# Patient Record
Sex: Female | Born: 1950 | Race: White | Hispanic: No | Marital: Single | Smoking: Current every day smoker
Health system: Southern US, Community
[De-identification: ages and names within clinical notes are randomized; demographics above are authoritative.]

## PROBLEM LIST (undated history)

## (undated) DIAGNOSIS — F32A Depression, unspecified: Secondary | ICD-10-CM

## (undated) DIAGNOSIS — F319 Bipolar disorder, unspecified: Secondary | ICD-10-CM

## (undated) DIAGNOSIS — F329 Major depressive disorder, single episode, unspecified: Secondary | ICD-10-CM

## (undated) DIAGNOSIS — R001 Bradycardia, unspecified: Secondary | ICD-10-CM

## (undated) DIAGNOSIS — F419 Anxiety disorder, unspecified: Secondary | ICD-10-CM

## (undated) DIAGNOSIS — E039 Hypothyroidism, unspecified: Secondary | ICD-10-CM

## (undated) DIAGNOSIS — T56891A Toxic effect of other metals, accidental (unintentional), initial encounter: Secondary | ICD-10-CM

## (undated) DIAGNOSIS — E119 Type 2 diabetes mellitus without complications: Secondary | ICD-10-CM

## (undated) HISTORY — DX: Anxiety disorder, unspecified: F41.9

## (undated) HISTORY — DX: Type 2 diabetes mellitus without complications: E11.9

## (undated) HISTORY — PX: CHOLECYSTECTOMY: SHX55

## (undated) HISTORY — DX: Major depressive disorder, single episode, unspecified: F32.9

## (undated) HISTORY — DX: Depression, unspecified: F32.A

## (undated) HISTORY — PX: ABDOMINAL HYSTERECTOMY: SHX81

---

## 1999-02-16 ENCOUNTER — Emergency Department (HOSPITAL_COMMUNITY): Admission: EM | Admit: 1999-02-16 | Discharge: 1999-02-16 | Payer: Self-pay | Admitting: Emergency Medicine

## 1999-07-16 ENCOUNTER — Other Ambulatory Visit: Admission: RE | Admit: 1999-07-16 | Discharge: 1999-07-16 | Payer: Self-pay | Admitting: Gynecology

## 2011-04-28 ENCOUNTER — Ambulatory Visit: Payer: Self-pay | Admitting: Gastroenterology

## 2011-06-16 ENCOUNTER — Inpatient Hospital Stay: Payer: Self-pay | Admitting: Psychiatry

## 2011-06-16 LAB — URINALYSIS, COMPLETE
Bacteria: NONE SEEN
Bilirubin,UR: NEGATIVE
Glucose,UR: NEGATIVE mg/dL (ref 0–75)
Ketone: NEGATIVE
Leukocyte Esterase: NEGATIVE
Ph: 5 (ref 4.5–8.0)
Protein: NEGATIVE
RBC,UR: <1 /HPF (ref 0–5)
WBC UR: 1 /HPF (ref 0–5)

## 2011-06-16 LAB — COMPREHENSIVE METABOLIC PANEL
Alkaline Phosphatase: 93 U/L (ref 50–136)
BUN: 12 mg/dL (ref 7–18)
Bilirubin,Total: 0.5 mg/dL (ref 0.2–1.0)
Chloride: 102 mmol/L (ref 98–107)
Co2: 22 mmol/L (ref 21–32)
Creatinine: 1.45 mg/dL — ABNORMAL HIGH (ref 0.60–1.30)
EGFR (African American): 47 — ABNORMAL LOW
EGFR (Non-African Amer.): 39 — ABNORMAL LOW
Osmolality: 281 (ref 275–301)
SGOT(AST): 90 U/L — ABNORMAL HIGH (ref 15–37)
SGPT (ALT): 54 U/L

## 2011-06-16 LAB — TSH: Thyroid Stimulating Horm: >100 u[IU]/mL

## 2011-06-16 LAB — DRUG SCREEN, URINE
Amphetamines, Ur Screen: NEGATIVE (ref ?–1000)
Benzodiazepine, Ur Scrn: NEGATIVE (ref ?–200)
Cannabinoid 50 Ng, Ur ~~LOC~~: NEGATIVE (ref ?–50)
Cocaine Metabolite,Ur ~~LOC~~: NEGATIVE (ref ?–300)
MDMA (Ecstasy)Ur Screen: NEGATIVE (ref ?–500)
Methadone, Ur Screen: NEGATIVE (ref ?–300)
Phencyclidine (PCP) Ur S: NEGATIVE (ref ?–25)
Tricyclic, Ur Screen: NEGATIVE (ref ?–1000)

## 2011-06-16 LAB — CBC
HCT: 44.9 % (ref 35.0–47.0)
HGB: 15 g/dL (ref 12.0–16.0)
MCH: 32.1 pg (ref 26.0–34.0)
MCHC: 33.4 g/dL (ref 32.0–36.0)
MCV: 96 fL (ref 80–100)
RDW: 17.3 % — ABNORMAL HIGH (ref 11.5–14.5)

## 2011-06-16 LAB — SALICYLATE LEVEL: Salicylates, Serum: 3.8 mg/dL — ABNORMAL HIGH

## 2011-06-16 LAB — ACETAMINOPHEN LEVEL: Acetaminophen: <2 ug/mL

## 2011-06-16 LAB — ETHANOL
Ethanol %: <0.003 % (ref 0.000–0.080)
Ethanol: <3 mg/dL

## 2011-06-16 LAB — HEMOGLOBIN A1C: Hemoglobin A1C: 6.2 % (ref 4.2–6.3)

## 2011-06-17 LAB — BASIC METABOLIC PANEL
BUN: 14 mg/dL (ref 7–18)
Chloride: 105 mmol/L (ref 98–107)
Co2: 27 mmol/L (ref 21–32)
Creatinine: 1.31 mg/dL — ABNORMAL HIGH (ref 0.60–1.30)
Glucose: 101 mg/dL — ABNORMAL HIGH (ref 65–99)
Osmolality: 284 (ref 275–301)
Potassium: 3.6 mmol/L (ref 3.5–5.1)

## 2011-06-17 LAB — TSH: Thyroid Stimulating Horm: >100 u[IU]/mL

## 2011-06-17 LAB — LIPID PANEL: Triglycerides: 682 mg/dL — ABNORMAL HIGH (ref 0–200)

## 2011-06-17 LAB — T4, FREE: Free Thyroxine: 0.57 ng/dL — ABNORMAL LOW (ref 0.76–1.46)

## 2011-06-17 LAB — FOLATE: Folic Acid: 6.8 ng/mL (ref 3.1–100.0)

## 2011-06-18 LAB — BASIC METABOLIC PANEL
Anion Gap: 13 (ref 7–16)
BUN: 14 mg/dL (ref 7–18)
Calcium, Total: 8.7 mg/dL (ref 8.5–10.1)
Co2: 26 mmol/L (ref 21–32)
Creatinine: 1.19 mg/dL (ref 0.60–1.30)
EGFR (African American): 60 — ABNORMAL LOW
Glucose: 115 mg/dL — ABNORMAL HIGH (ref 65–99)
Osmolality: 286 (ref 275–301)

## 2011-06-18 LAB — WBCS, STOOL

## 2011-06-21 LAB — T4, FREE: Free Thyroxine: 0.56 ng/dL — ABNORMAL LOW (ref 0.76–1.46)

## 2012-03-14 ENCOUNTER — Emergency Department: Payer: Self-pay | Admitting: Emergency Medicine

## 2012-03-14 LAB — URINALYSIS, COMPLETE
Bilirubin,UR: NEGATIVE
Blood: NEGATIVE
Glucose,UR: NEGATIVE mg/dL (ref 0–75)
Nitrite: NEGATIVE
Protein: NEGATIVE
RBC,UR: 1 /HPF (ref 0–5)
Specific Gravity: 1.005 (ref 1.003–1.030)
WBC UR: 2 /HPF (ref 0–5)

## 2012-03-14 LAB — COMPREHENSIVE METABOLIC PANEL
Alkaline Phosphatase: 141 U/L — ABNORMAL HIGH (ref 50–136)
Anion Gap: 14 (ref 7–16)
Bilirubin,Total: 0.6 mg/dL (ref 0.2–1.0)
Calcium, Total: 9.3 mg/dL (ref 8.5–10.1)
Chloride: 102 mmol/L (ref 98–107)
Co2: 21 mmol/L (ref 21–32)
EGFR (Non-African Amer.): >60
Osmolality: 273 (ref 275–301)
Sodium: 137 mmol/L (ref 136–145)

## 2012-03-14 LAB — AMYLASE: Amylase: 32 U/L (ref 25–115)

## 2012-03-14 LAB — CBC
HCT: 45.9 % (ref 35.0–47.0)
HGB: 16.2 g/dL — ABNORMAL HIGH (ref 12.0–16.0)
MCH: 32.9 pg (ref 26.0–34.0)
MCHC: 35.2 g/dL (ref 32.0–36.0)
Platelet: 206 10*3/uL (ref 150–440)
RBC: 4.91 10*6/uL (ref 3.80–5.20)

## 2012-03-14 LAB — LIPASE, BLOOD: Lipase: 103 U/L (ref 73–393)

## 2012-03-28 ENCOUNTER — Inpatient Hospital Stay: Payer: Self-pay | Admitting: Psychiatry

## 2012-03-28 LAB — COMPREHENSIVE METABOLIC PANEL
Alkaline Phosphatase: 122 U/L (ref 50–136)
BUN: 6 mg/dL — ABNORMAL LOW (ref 7–18)
Bilirubin,Total: 0.3 mg/dL (ref 0.2–1.0)
Calcium, Total: 8.6 mg/dL (ref 8.5–10.1)
Co2: 20 mmol/L — ABNORMAL LOW (ref 21–32)
EGFR (African American): >60
EGFR (Non-African Amer.): 57 — ABNORMAL LOW
Osmolality: 277 (ref 275–301)
Potassium: 3.4 mmol/L — ABNORMAL LOW (ref 3.5–5.1)
SGOT(AST): 42 U/L — ABNORMAL HIGH (ref 15–37)
Sodium: 138 mmol/L (ref 136–145)

## 2012-03-28 LAB — ETHANOL
Ethanol %: <0.003 % (ref 0.000–0.080)
Ethanol: <3 mg/dL

## 2012-03-28 LAB — DRUG SCREEN, URINE
Amphetamines, Ur Screen: NEGATIVE (ref ?–1000)
Benzodiazepine, Ur Scrn: NEGATIVE (ref ?–200)
Methadone, Ur Screen: NEGATIVE (ref ?–300)
Opiate, Ur Screen: NEGATIVE (ref ?–300)
Phencyclidine (PCP) Ur S: NEGATIVE (ref ?–25)

## 2012-03-28 LAB — TSH: Thyroid Stimulating Horm: 7.5 u[IU]/mL — ABNORMAL HIGH

## 2012-03-28 LAB — URINALYSIS, COMPLETE
Ketone: NEGATIVE
Nitrite: NEGATIVE
Ph: 6 (ref 4.5–8.0)
Protein: NEGATIVE
RBC,UR: NONE SEEN /HPF (ref 0–5)
Specific Gravity: 1.006 (ref 1.003–1.030)
WBC UR: 1 /HPF (ref 0–5)

## 2012-03-28 LAB — CBC
HGB: 16.3 g/dL — ABNORMAL HIGH (ref 12.0–16.0)
MCHC: 34.3 g/dL (ref 32.0–36.0)
Platelet: 214 10*3/uL (ref 150–440)
RDW: 15.4 % — ABNORMAL HIGH (ref 11.5–14.5)

## 2012-10-30 ENCOUNTER — Emergency Department: Payer: Self-pay | Admitting: Emergency Medicine

## 2012-10-30 LAB — COMPREHENSIVE METABOLIC PANEL
BUN: 11 mg/dL (ref 7–18)
Chloride: 104 mmol/L (ref 98–107)
Co2: 27 mmol/L (ref 21–32)
Creatinine: 1.09 mg/dL (ref 0.60–1.30)
EGFR (Non-African Amer.): 55 — ABNORMAL LOW
Osmolality: 276 (ref 275–301)
Potassium: 3.8 mmol/L (ref 3.5–5.1)
SGOT(AST): 28 U/L (ref 15–37)
SGPT (ALT): 33 U/L (ref 12–78)
Total Protein: 7.1 g/dL (ref 6.4–8.2)

## 2012-10-30 LAB — CBC
MCH: 31.1 pg (ref 26.0–34.0)
Platelet: 187 10*3/uL (ref 150–440)
RBC: 4.87 10*6/uL (ref 3.80–5.20)
RDW: 15.8 % — ABNORMAL HIGH (ref 11.5–14.5)

## 2012-10-31 LAB — URINALYSIS, COMPLETE
Bilirubin,UR: NEGATIVE
Blood: NEGATIVE
Glucose,UR: NEGATIVE mg/dL (ref 0–75)
Nitrite: POSITIVE
Ph: 5 (ref 4.5–8.0)
Protein: NEGATIVE
Specific Gravity: 1.019 (ref 1.003–1.030)
WBC UR: 6 /HPF (ref 0–5)

## 2012-12-27 ENCOUNTER — Ambulatory Visit: Payer: Self-pay | Admitting: Gastroenterology

## 2013-03-20 ENCOUNTER — Ambulatory Visit: Payer: Self-pay | Admitting: Family Medicine

## 2013-04-16 ENCOUNTER — Ambulatory Visit: Payer: Self-pay | Admitting: Family Medicine

## 2013-05-17 ENCOUNTER — Ambulatory Visit: Payer: Self-pay | Admitting: Family Medicine

## 2014-02-25 ENCOUNTER — Ambulatory Visit: Payer: Self-pay | Admitting: Specialist

## 2014-03-04 ENCOUNTER — Ambulatory Visit: Payer: Self-pay | Admitting: Gastroenterology

## 2014-03-19 LAB — PATHOLOGY REPORT

## 2014-05-20 ENCOUNTER — Ambulatory Visit: Payer: Self-pay | Admitting: Family Medicine

## 2014-08-10 ENCOUNTER — Emergency Department: Payer: Self-pay | Admitting: Emergency Medicine
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
## 2014-09-08 NOTE — Consult Note (Signed)
Psychological Assessment  Julie PikesSusan Church60of Evaluation: 2[birth year]11[birth year]13Administered: [place] Multiphasic Personality Inventory-2 (MMPI-2) for Referral: Ms. Julie HackerChurch was referred for a psychological assessment by her physician, Caryn SectionAarti Kapur, MD.  She was admitted to Behavioral Medicine for treatment of suicidal ideation with a plan and auditory and visual hallucinations. She has a history of bipolar disorder. Please see the history and physical and psychosocial history for further background information. An assessment of personality structure was requested. Ms. Julie Morrow?s MMPI-2 protocol is compared to that of other adult females she obtained the following profile: 2*7"36?81+-045/9:#. The MMPI-2 validity scales indicate that the clinical profile is valid. They also suggest she is experiencing long standing and serious problems. PresentationShe reports that she is experiencing a moderate level of emotional distress that is characterized by brooding, dysphoria, anger and anhedonia. She frequently worries about something or someone. She believes she is more nervous than most others. She is more sensitive and feels more intensely than most people. Her feelings are easily hurt and she is inclined to take things hard. She is often irritable and grouchy and generally feels angry with both herself and others. It makes her angry when people hurry her and she gets angry with herself for giving in to others so much. She has become so angry that she does not know what comes over her and she feels as though she will explode.  She reports that she has problems with attention, concentration and memory. Her judgment is not as good as it was in the past. She lacks self-confidence and believes that she is not as good as other people and thinks she is useless at times. She has a hard time making decisions and she feels helpless when she has to make some important decisions. She usually has to stop and think before she acts even in small  matters. She has sometimes thought that difficulties were piling up so high that she could not overcome them. Sometimes some unimportant thought will run through her mind and bother her for days. She thinks that she is about to go to pieces and to lose her mind. She is sensitive to any form of criticism, assumes others are against her, and anticipates being rejected. In everything she does lately, she thinks she is being tested. She has often been misunderstood when she was trying to be helpful. She is not happy with herself the way she is and wishes that she could be as happy as others seem to be. Her hardest battles are with herself.  Relations: She wishes she was not so shy. She is easily embarrassed. She finds it hard to talk when she meets new people or is in a group of people and spends most of her spare time by herself. Whenever possible she avoids being in a crowd.  Problem Areas: She reports a number of physical symptoms. She worries about her health. She has difficulty going to sleep because thoughts or ideas are bothering her, her sleep is fitful and disturbed and she does not wake up fresh and rested most mornings. She feels tired a good deal of the time. She is not as able to work as she once was. There is a theme of hopelessness that pervades her thoughts; hence, suicidal ideation should be evaluated carefully. Her prognosis is guarded because her anger and brooding resentment make it very difficult to develop a therapeutic alliance. Short-term, behavioral interventions that focus on her reasons for entering treatment will be most beneficial. Cognitive-behavioral therapy that focuses on her dysphoric mood  and anger also may be appropriate.  Impression:Disorder by historyDisorder NOSDisorder NOS with histrionic and dependent features   Electronic Signatures: Carola Frost (PsyD, HSP-P)  (Signed on 12-Feb-13 11:31)  Authored  Last Updated: 12-Feb-13 11:31 by Carola Frost (PsyD, HSP-P)
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
## 2014-09-08 NOTE — Consult Note (Signed)
Chief Complaint and History:   Referring Physician Dr. Maryruth BunKapur    Chief Complaint Hypothyroidism   Allergies:  Neosporin: Itching  Assessment/Plan:   Assessment/Plan Patient was seen and examined and chart was reviewed. She was admitted with suicidal ideation. TSH found to be >100 and free T4 is low. She has a 15 yr h/o hypothyroidism. Outpt dose of levothyroxine is 175 mcg daily. She admits to stopping the medication 3-4 weeks ago but then restarted it last Sat when she got a refill from a local Urgent Care. She has symptoms of hypothyroidism including fatigue, cold intolerance, and hair loss.  A / Primary hypothyroidism, severely uncontrolled P /  1. Agree with use of leovthyroxine 200 mcg PO x3 days and then reduce dose to 175 mcg daily. 2. Recheck Free T4 on Monday. If it has increased, then this indicates adequate drug absorption. No need to repeat the TSH until 4-6 weeks.  Full consult has been dictated.   Electronic Signatures: Raj JanusSolum, Shanica Castellanos M (MD)  (Signed 31-Jan-13 15:01)  Authored: Chief Complaint and History, ALLERGIES, Assessment/Plan   Last Updated: 31-Jan-13 15:01 by Raj JanusSolum, Tavarius Grewe M (MD)

## 2014-09-08 NOTE — Consult Note (Signed)
Chief Complaint:   Subjective/Chief Complaint Please see Dawn Harrison's notes. Diarrhea likely due to GB surgery. Stool studies so far are neg. Imodium worked initially to stop diarrhea. However, diarrhea back today. Questran started.   VITAL SIGNS/ANCILLARY NOTES: **Vital Signs.:   01-Feb-13 06:29   Vital Signs Type Q8   Temperature Temperature (F) 98.2   Celsius 36.7   Pulse Pulse 68   Respirations Respirations 20   Systolic BP Systolic BP 797   Diastolic BP (mmHg) Diastolic BP (mmHg) 71   Brief Assessment:   Cardiac Regular    Respiratory clear BS    Gastrointestinal details normal Soft  Nondistended  No masses palpable  Bowel sounds normal  No rebound tenderness  No gaurding  No rigidity  No organomegaly  mild low abd tenderness   Routine Chem:  01-Feb-13 05:56    Glucose, Serum 115   BUN 14   Creatinine (comp) 1.19   Sodium, Serum 143   Potassium, Serum 3.8   Chloride, Serum 104   CO2, Serum 26   Calcium (Total), Serum 8.7   Osmolality (calc) 286   eGFR (African American) 60   eGFR (Non-African American) 49   Anion Gap 13   Assessment/Plan:  Assessment/Plan:   Assessment Chronic diarrhea. Likely GB surgery related.    Plan Hopefully, diarrhea will resolve over time with questran. Pt does eventually need colonoscopy, perhaps outpt. Will check on Monday if diarrhea does not improve. THanks.   Electronic Signatures: Verdie Shire (MD)  (Signed 01-Feb-13 12:35)  Authored: Chief Complaint, VITAL SIGNS/ANCILLARY NOTES, Brief Assessment, Lab Results, Assessment/Plan   Last Updated: 01-Feb-13 12:35 by Verdie Shire (MD)
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
## 2014-09-08 NOTE — Consult Note (Signed)
Brief Consult Note: Diagnosis: Chronic Diarrhea s/p cholecystectomy.  Suspect bile induced but underlying infectious cause needs to be excluded.  Known history of Bipolar disorder.  History of hypothryoidism not well controlled.  Suidical thoughts with a plan.   Recommend further assessment or treatment.   Discussed with Attending MD.   Comments: Patient's presentation discussed with Dr. Lutricia FeilPaul Oh.  Recommendation is to proceed with stool studies as ordered.  Will await results.  Will order Questran one packet bid to be started after stool studies have been collected.  Recognize patient does need to proceed with colonoscopy in the future but recommend procedure being done on an outpatient basis and this is the patient's wish.  Electronic Signatures: Rodman KeyHarrison, Dawn S (NP)  (Signed 31-Jan-13 15:34)  Authored: Brief Consult Note   Last Updated: 31-Jan-13 15:34 by Rodman KeyHarrison, Dawn S (NP)

## 2014-11-07 ENCOUNTER — Ambulatory Visit (INDEPENDENT_AMBULATORY_CARE_PROVIDER_SITE_OTHER): Payer: Commercial Managed Care - HMO | Admitting: Psychiatry

## 2014-11-07 ENCOUNTER — Encounter: Payer: Self-pay | Admitting: Psychiatry

## 2014-11-07 ENCOUNTER — Other Ambulatory Visit: Payer: Self-pay | Admitting: Psychiatry

## 2014-11-07 ENCOUNTER — Other Ambulatory Visit: Payer: Self-pay

## 2014-11-07 VITALS — BP 138/78 | HR 85 | Temp 97.8°F | Ht 63.0 in | Wt 296.8 lb

## 2014-11-07 DIAGNOSIS — F331 Major depressive disorder, recurrent, moderate: Secondary | ICD-10-CM

## 2014-11-07 DIAGNOSIS — L409 Psoriasis, unspecified: Secondary | ICD-10-CM | POA: Insufficient documentation

## 2014-11-07 DIAGNOSIS — E785 Hyperlipidemia, unspecified: Secondary | ICD-10-CM | POA: Insufficient documentation

## 2014-11-07 MED ORDER — DULOXETINE HCL 30 MG PO CPEP: 30.0000 mg | ORAL_CAPSULE | Freq: Every day | ORAL | Status: DC

## 2014-11-07 MED ORDER — CLONAZEPAM 1 MG PO TABS: 1.0000 mg | ORAL_TABLET | Freq: Every day | ORAL | Status: DC

## 2014-11-07 MED ORDER — QUETIAPINE FUMARATE 50 MG PO TABS: 50.0000 mg | ORAL_TABLET | Freq: Every day | ORAL | Status: DC

## 2014-11-07 NOTE — Progress Notes (Signed)
BH MD/PA/NP OP Progress Note  11/07/2014 9:55 AM Julie Morrow  MRN:  [id number]  Subjective:    Patient is a 64 year old morbidly obese female who presented for the follow-up appointment. She reported that she is currently doing well on her medications. She stated that she almost missed her appointment. Patient reported that she has met with Dr. Luanna Cole and is planning for gastric sleeve. She is also trying to do some exercise to lose weight. Patient reported that she has moved in with her father after the death of her mother. Patient stated that she wants to go down on the dose of Cymbalta. She currently denied having any adverse effects of the medication. She denied having any suicidal homicidal ideations or plans.  Chief Complaint:  Chief Complaint    Follow-up; Depression     Visit Diagnosis:     ICD-9-CM ICD-10-CM   1. MDD (major depressive disorder), recurrent episode, moderate 296.32 F33.1     Past Medical History:  Past Medical History  Diagnosis Date  . Diabetes mellitus, type II   . Thyroid disease   . Depression   . Anxiety     Past Surgical History  Procedure Laterality Date  . Abdominal hysterectomy     Family History:  Family History  Problem Relation Age of Onset  . Dementia Mother   . Arthritis Father    Social History:  History   Social History  . Marital Status: Single    Spouse Name: N/A  . Number of Children: N/A  . Years of Education: N/A   Social History Main Topics  . Smoking status: Current Every Day Smoker -- 0.25 packs/day    Types: Cigarettes    Start date: 11/06/1984  . Smokeless tobacco: Never Used  . Alcohol Use: No  . Drug Use: No  . Sexual Activity: No   Other Topics Concern  . None   Social History Narrative  . None   Additional History:   Living with father after the death of mother. He is get along with.    Assessment:   Musculoskeletal: Strength & Muscle Tone: within normal limits Gait & Station: normal Patient  leans: N/A  Psychiatric Specialty Exam: HPI  Review of Systems  Constitutional: Positive for malaise/fatigue. Negative for weight loss.  HENT: Negative for nosebleeds and tinnitus.   Eyes: Negative for photophobia.  Respiratory: Negative for hemoptysis.   Cardiovascular: Negative for orthopnea.  Gastrointestinal: Negative for vomiting and blood in stool.  Genitourinary: Negative for frequency.  Musculoskeletal: Positive for back pain.  Psychiatric/Behavioral: Negative for depression and hallucinations. The patient has insomnia. The patient is not nervous/anxious.     Blood pressure 138/78, pulse 85, temperature 97.8 F (36.6 C), temperature source Tympanic, height _0  (1.6 m), weight 296 lb 12.8 oz (134.628 kg), SpO2 90 %.Body mass index is 52.59 kg/(m^2).  General Appearance: Disheveled  Eye Contact:  Fair  Speech:  Clear and Coherent  Volume:  Normal  Mood:  Depressed  Affect:  Congruent  Thought Process:  Coherent  Orientation:  Full (Time, Place, and Person)  Thought Content:  WDL  Suicidal Thoughts:  No  Homicidal Thoughts:  No  Memory:  Immediate;   Fair  Judgement:  Fair  Insight:  Fair  Psychomotor Activity:  Decreased  Concentration:  Fair  Recall:  [employer] of Knowledge: Fair  Language: Fair  Akathisia:  No  Handed:  Right  AIMS (if indicated):  none  Assets:  Communication Skills Desire  for Improvement Social Support  ADL's:  Intact  Cognition: WNL  Sleep:  7-8   Is the patient at risk to self?  No. Has the patient been a risk to self in the past 6 months?  No. Has the patient been a risk to self within the distant past?  No. Is the patient a risk to others?  No. Has the patient been a risk to others in the past 6 months?  No. Has the patient been a risk to others within the distant past?  No.  Current Medications: Current Outpatient Prescriptions  Medication Sig Dispense Refill  . albuterol (PROAIR HFA) 108 (90 BASE) MCG/ACT inhaler Inhale into  the lungs.    . clonazePAM (KLONOPIN) 1 MG tablet Take by mouth.    . DULoxetine (CYMBALTA) 30 MG capsule Take by mouth.    . fluticasone (FLONASE) 50 MCG/ACT nasal spray Place into the nose.    Marland Kitchen glipiZIDE (GLUCOTROL) 10 MG tablet Take by mouth.    Marland Kitchen JANUVIA 100 MG tablet TK 1 T PO QD  5  . Lancets 30G MISC Use. Use 1 lancet to skin twice a day    . levothyroxine (SYNTHROID, LEVOTHROID) 125 MCG tablet TK 2 TS PO QD OES WITH A GLASS OF WATER AT LEAST 30-60 MINUTES BEFORE BREAKFAST  5  . nystatin (MYCOSTATIN/NYSTOP) 100000 UNIT/GM POWD APPLY TOPICALLY TID  1  . omega-3 acid ethyl esters (LOVAZA) 1 G capsule TAKE 2 CAPSULES BY MOUTH TWICE DAILY    . omeprazole (PRILOSEC) 20 MG capsule Take by mouth.    . QUEtiapine (SEROQUEL) 50 MG tablet TK 1 T PO QHS  0   No current facility-administered medications for this visit.    Medical Decision Making:  Established Problem, Stable/Improving (1) and Review of Last Therapy Session (1)  Treatment Plan Summary:Medication management  Discussed with patient about the medications and she wants to decrease the dose of Cymbalta to 30 mg Continue other medications as prescribed Follow-up in 2 months   More than 50% of the time spent in psychoeducation, counseling and coordination of care.    This note was generated in part or whole with voice recognition software. Voice regonition is usually quite accurate but there are transcription errors that can and very often do occur. I apologize for any typographical errors that were not detected and corrected.      Rainey Pines 11/07/2014, 9:55 AM

## 2015-01-07 ENCOUNTER — Encounter: Payer: Self-pay | Admitting: Psychiatry

## 2015-01-07 ENCOUNTER — Ambulatory Visit (INDEPENDENT_AMBULATORY_CARE_PROVIDER_SITE_OTHER): Payer: Commercial Managed Care - HMO | Admitting: Psychiatry

## 2015-01-07 VITALS — BP 132/84 | HR 78 | Temp 97.5°F | Ht 63.5 in | Wt 302.8 lb

## 2015-01-07 DIAGNOSIS — F331 Major depressive disorder, recurrent, moderate: Secondary | ICD-10-CM | POA: Diagnosis not present

## 2015-01-07 MED ORDER — QUETIAPINE FUMARATE 50 MG PO TABS: 50.0000 mg | ORAL_TABLET | Freq: Every day | ORAL | Status: DC

## 2015-01-07 MED ORDER — DULOXETINE HCL 60 MG PO CPEP: 60.0000 mg | ORAL_CAPSULE | Freq: Every day | ORAL | Status: DC

## 2015-01-07 MED ORDER — CLONAZEPAM 0.5 MG PO TABS: 0.5000 mg | ORAL_TABLET | Freq: Every day | ORAL | Status: DC

## 2015-01-07 NOTE — Progress Notes (Signed)
BH MD/PA/NP OP Progress Note  01/07/2015 10:06 AM Julie Morrow  MRN:  [id number]  Subjective:    Patient is a 64 year old morbidly obese female who presented for the follow-up appointment. She appeared somewhat confused during the interview and reported that she was lost gain by coming to this office. She parked  in the wrong building and then has to walk to this office so she does not miss her appointment. Patient reported that she sleeps too much at night as she has been taking Klonopin and Seroquel. She reported that she eats a lot as well. She reported that she lives with her father. She was noted to have some rambling speech during the interview. Patient currently denied having any mood  swings anger anxiety and reported that she does not have any acute issues at this time.   Chief Complaint:  Chief Complaint    Follow-up; Medication Refill     Visit Diagnosis:     ICD-9-CM ICD-10-CM   1. MDD (major depressive disorder), recurrent episode, moderate 296.32 F33.1     Past Medical History:  Past Medical History  Diagnosis Date  . Diabetes mellitus, type II   . Thyroid disease   . Depression   . Anxiety     Past Surgical History  Procedure Laterality Date  . Abdominal hysterectomy     Family History:  Family History  Problem Relation Age of Onset  . Dementia Mother   . Arthritis Father    Social History:  Social History   Social History  . Marital Status: Single    Spouse Name: N/A  . Number of Children: N/A  . Years of Education: N/A   Social History Main Topics  . Smoking status: Current Every Day Smoker -- 0.25 packs/day    Types: Cigarettes    Start date: 11/06/1984  . Smokeless tobacco: Never Used  . Alcohol Use: No  . Drug Use: No  . Sexual Activity: No   Other Topics Concern  . None   Social History Narrative   Additional History:   Living with father after the death of mother.    Assessment:   Musculoskeletal: Strength & Muscle Tone:  within normal limits Gait & Station: normal Patient leans: N/A  Psychiatric Specialty Exam: HPI   Review of Systems  Constitutional: Positive for malaise/fatigue. Negative for weight loss.  HENT: Negative for nosebleeds and tinnitus.   Eyes: Negative for photophobia.  Respiratory: Negative for hemoptysis.   Cardiovascular: Negative for orthopnea.  Gastrointestinal: Negative for vomiting and blood in stool.  Genitourinary: Negative for frequency.  Musculoskeletal: Positive for back pain.  Psychiatric/Behavioral: Positive for depression. Negative for hallucinations. The patient has insomnia. The patient is not nervous/anxious.     Blood pressure 132/84, pulse 78, temperature 97.5 F (36.4 C), temperature source Tympanic, height 5' 3.5" (1.613 m), weight 302 lb 12.8 oz (137.349 kg), SpO2 92 %.Body mass index is 52.79 kg/(m^2).  General Appearance: Disheveled  Eye Contact:  Fair  Speech:  Slow  Volume:  Normal  Mood:  Depressed  Affect:  Congruent  Thought Process:  Coherent  Orientation:  Full (Time, Place, and Person)  Thought Content:  WDL  Suicidal Thoughts:  No  Homicidal Thoughts:  No  Memory:  Immediate;   Fair  Judgement:  Fair  Insight:  Fair  Psychomotor Activity:  Decreased  Concentration:  Fair  Recall:  [employer] of Knowledge: Fair  Language: Fair  Akathisia:  No  Handed:  Right  AIMS (if indicated):  none  Assets:  Communication Skills Desire for Improvement Social Support  ADL's:  Intact  Cognition: WNL  Sleep:  7-8   Is the patient at risk to self?  No. Has the patient been a risk to self in the past 6 months?  No. Has the patient been a risk to self within the distant past?  No. Is the patient a risk to others?  No. Has the patient been a risk to others in the past 6 months?  No. Has the patient been a risk to others within the distant past?  No.  Current Medications: Current Outpatient Prescriptions  Medication Sig Dispense Refill  . albuterol  (PROAIR HFA) 108 (90 BASE) MCG/ACT inhaler Inhale into the lungs.    . ciprofloxacin (CIPRO) 500 MG tablet Take by mouth.    . clonazePAM (KLONOPIN) 1 MG tablet Take 1 tablet (1 mg total) by mouth at bedtime. 30 tablet 1  . DULoxetine (CYMBALTA) 30 MG capsule Take 1 capsule (30 mg total) by mouth daily. 90 capsule 1  . fluconazole (DIFLUCAN) 150 MG tablet Take 1 tablet once a week for 2 weeks    . fluticasone (FLONASE) 50 MCG/ACT nasal spray Place into the nose.    Marland Kitchen glipiZIDE (GLUCOTROL) 10 MG tablet Take by mouth.    Marland Kitchen JANUVIA 100 MG tablet TK 1 T PO QD  5  . ketoconazole (NIZORAL) 2 % cream APP EXT AA QD  0  . Lancets 30G MISC Use. Use 1 lancet to skin twice a day    . levothyroxine (SYNTHROID, LEVOTHROID) 125 MCG tablet TK 2 TS PO QD OES WITH A GLASS OF WATER AT LEAST 30-60 MINUTES BEFORE BREAKFAST  5  . nystatin (MYCOSTATIN/NYSTOP) 100000 UNIT/GM POWD APPLY TOPICALLY TID  1  . omega-3 acid ethyl esters (LOVAZA) 1 G capsule TAKE 2 CAPSULES BY MOUTH TWICE DAILY    . omeprazole (PRILOSEC) 20 MG capsule Take by mouth.    . ONE TOUCH ULTRA TEST test strip CHECK BLOOD SUGAR BID UTD  12  . QUEtiapine (SEROQUEL) 50 MG tablet TAKE 1 TABLET(50 MG) BY MOUTH AT BEDTIME 90 tablet 1   No current facility-administered medications for this visit.    Medical Decision Making:  Established Problem, Stable/Improving (1) and Review of Last Therapy Session (1)  Treatment Plan Summary:Medication management  Discussed with patient about the medications She appeared somewhat sedated with the effect of the medications. I will decrease the Klonopin to 0.5 mg by mouth daily at bedtime I will titrate the Cymbalta back to 60 mg in the morning I will continue her on Seroquel 50 mg at bedtime She was given 90 day supplies of Cymbalta and Seroquel at this time. Follow-up in 3 months or earlier depending on her symptoms    More than 50% of the time spent in psychoeducation, counseling and coordination of care.     This note was generated in part or whole with voice recognition software. Voice regonition is usually quite accurate but there are transcription errors that can and very often do occur. I apologize for any typographical errors that were not detected and corrected.      Brandy Hale 01/07/2015, 10:06 AM

## 2015-01-13 NOTE — Progress Notes (Signed)
This has been reorder, not discontinued.  

## 2015-02-14 ENCOUNTER — Encounter: Payer: Self-pay | Admitting: Emergency Medicine

## 2015-02-14 ENCOUNTER — Emergency Department
Admission: EM | Admit: 2015-02-14 | Discharge: 2015-02-15 | Disposition: A | Discharge status: Home / Self Care | Payer: Commercial Managed Care - HMO | Attending: Emergency Medicine | Admitting: Emergency Medicine

## 2015-02-14 DIAGNOSIS — Z720 Tobacco use: Secondary | ICD-10-CM | POA: Diagnosis not present

## 2015-02-14 DIAGNOSIS — E1165 Type 2 diabetes mellitus with hyperglycemia: Secondary | ICD-10-CM | POA: Insufficient documentation

## 2015-02-14 DIAGNOSIS — R197 Diarrhea, unspecified: Secondary | ICD-10-CM | POA: Diagnosis not present

## 2015-02-14 DIAGNOSIS — N3001 Acute cystitis with hematuria: Secondary | ICD-10-CM | POA: Insufficient documentation

## 2015-02-14 DIAGNOSIS — B372 Candidiasis of skin and nail: Secondary | ICD-10-CM

## 2015-02-14 DIAGNOSIS — Z79899 Other long term (current) drug therapy: Secondary | ICD-10-CM | POA: Diagnosis not present

## 2015-02-14 DIAGNOSIS — R739 Hyperglycemia, unspecified: Secondary | ICD-10-CM

## 2015-02-14 DIAGNOSIS — R109 Unspecified abdominal pain: Secondary | ICD-10-CM | POA: Diagnosis present

## 2015-02-14 DIAGNOSIS — R21 Rash and other nonspecific skin eruption: Secondary | ICD-10-CM | POA: Insufficient documentation

## 2015-02-14 DIAGNOSIS — B373 Candidiasis of vulva and vagina: Secondary | ICD-10-CM | POA: Insufficient documentation

## 2015-02-14 DIAGNOSIS — B3731 Acute candidiasis of vulva and vagina: Secondary | ICD-10-CM

## 2015-02-14 LAB — COMPREHENSIVE METABOLIC PANEL
ALBUMIN: 3.8 g/dL (ref 3.5–5.0)
ALK PHOS: 138 U/L — AB (ref 38–126)
ALT: 28 U/L (ref 14–54)
AST: 66 U/L — AB (ref 15–41)
Anion gap: 14 (ref 5–15)
BUN: 18 mg/dL (ref 6–20)
CHLORIDE: 97 mmol/L — AB (ref 101–111)
CO2: 24 mmol/L (ref 22–32)
CREATININE: 1.03 mg/dL — AB (ref 0.44–1.00)
Calcium: 9.4 mg/dL (ref 8.9–10.3)
GFR calc non Af Amer: 57 mL/min — ABNORMAL LOW (ref >60)
GLUCOSE: 415 mg/dL — AB (ref 65–99)
Potassium: 4.2 mmol/L (ref 3.5–5.1)
SODIUM: 135 mmol/L (ref 135–145)
Total Bilirubin: 0.5 mg/dL (ref 0.3–1.2)
Total Protein: 7 g/dL (ref 6.5–8.1)

## 2015-02-14 LAB — CBC
HCT: 44.3 % (ref 35.0–47.0)
Hemoglobin: 14.8 g/dL (ref 12.0–16.0)
MCH: 31 pg (ref 26.0–34.0)
MCHC: 33.5 g/dL (ref 32.0–36.0)
MCV: 92.7 fL (ref 80.0–100.0)
PLATELETS: 164 10*3/uL (ref 150–440)
RBC: 4.78 MIL/uL (ref 3.80–5.20)
RDW: 16.2 % — ABNORMAL HIGH (ref 11.5–14.5)
WBC: 9.4 10*3/uL (ref 3.6–11.0)

## 2015-02-14 LAB — GLUCOSE, CAPILLARY: Glucose-Capillary: 308 mg/dL — ABNORMAL HIGH (ref 65–99)

## 2015-02-14 LAB — LIPASE, BLOOD: LIPASE: 24 U/L (ref 22–51)

## 2015-02-14 NOTE — ED Notes (Signed)
Patient said they were unable to give urine sample. Pt. Instructed to notify staff when she needs to use the restroom

## 2015-02-14 NOTE — ED Notes (Signed)
Pain in vagina.

## 2015-02-14 NOTE — ED Notes (Signed)
Having pain to left lower abd and radiates into back.  Positive diarrhea for 3 months.

## 2015-02-15 LAB — URINALYSIS COMPLETE WITH MICROSCOPIC (ARMC ONLY)
BILIRUBIN URINE: NEGATIVE
Glucose, UA: 150 mg/dL — AB
KETONES UR: NEGATIVE mg/dL
NITRITE: POSITIVE — AB
PH: 5 (ref 5.0–8.0)
Protein, ur: 30 mg/dL — AB
Specific Gravity, Urine: 1.024 (ref 1.005–1.030)

## 2015-02-15 LAB — GLUCOSE, CAPILLARY: Glucose-Capillary: 319 mg/dL — ABNORMAL HIGH (ref 65–99)

## 2015-02-15 MED ORDER — IBUPROFEN 600 MG PO TABS
600.0000 mg | ORAL_TABLET | Freq: Once | ORAL | Status: AC
  Administered 2015-02-15: 600 mg via ORAL

## 2015-02-15 MED ORDER — FLUCONAZOLE 100 MG PO TABS
150.0000 mg | ORAL_TABLET | Freq: Once | ORAL | Status: AC
  Administered 2015-02-15: 150 mg via ORAL
  Filled 2015-02-15: qty 1

## 2015-02-15 MED ORDER — ACETAMINOPHEN 325 MG PO TABS
650.0000 mg | ORAL_TABLET | Freq: Once | ORAL | Status: AC
  Administered 2015-02-15: 650 mg via ORAL

## 2015-02-15 MED ORDER — CEPHALEXIN 500 MG PO CAPS
500.0000 mg | ORAL_CAPSULE | Freq: Once | ORAL | Status: AC
  Administered 2015-02-15: 500 mg via ORAL
  Filled 2015-02-15: qty 1

## 2015-02-15 MED ORDER — IBUPROFEN 600 MG PO TABS
ORAL_TABLET | ORAL | Status: AC
  Administered 2015-02-15: 600 mg via ORAL
  Filled 2015-02-15: qty 1

## 2015-02-15 MED ORDER — ACETAMINOPHEN 325 MG PO TABS
ORAL_TABLET | ORAL | Status: AC
  Administered 2015-02-15: 650 mg via ORAL
  Filled 2015-02-15: qty 2

## 2015-02-15 MED ORDER — FLUCONAZOLE 150 MG PO TABS: ORAL_TABLET | ORAL | Status: DC

## 2015-02-15 MED ORDER — CEPHALEXIN 500 MG PO CAPS: 500.0000 mg | ORAL_CAPSULE | Freq: Four times a day (QID) | ORAL | Status: AC

## 2015-02-15 MED ORDER — NYSTATIN 100000 UNIT/GM EX POWD
CUTANEOUS | Status: DC
Start: 2015-02-15 — End: 2015-06-30

## 2015-02-15 NOTE — Discharge Instructions (Signed)
Candida Infection A Candida infection (also called yeast, fungus, and Monilia infection) is an overgrowth of yeast that can occur anywhere on the body. A yeast infection commonly occurs in warm, moist body areas. Usually, the infection remains localized but can spread to become a systemic infection. A yeast infection may be a sign of a more severe disease such as diabetes, leukemia, or AIDS. A yeast infection can occur in both men and women. In women, Candida vaginitis is a vaginal infection. It is one of the most common causes of vaginitis. Men usually do not have symptoms or know they have an infection until other problems develop. Men may find out they have a yeast infection because their sex partner has a yeast infection. Uncircumcised men are more likely to get a yeast infection than circumcised men. This is because the uncircumcised glans is not exposed to air and does not remain as dry as that of a circumcised glans. Older adults may develop yeast infections around dentures. CAUSES  Women  Antibiotics.  Steroid medication taken for a long time.  Being overweight (obese).  Diabetes.  Poor immune condition.  Certain serious medical conditions.  Immune suppressive medications for organ transplant patients.  Chemotherapy.  Pregnancy.  Menstruation.  Stress and fatigue.  Intravenous drug use.  Oral contraceptives.  Wearing tight-fitting clothes in the crotch area.  Catching it from a sex partner who has a yeast infection.  Spermicide.  Intravenous, urinary, or other catheters. Men  Catching it from a sex partner who has a yeast infection.  Having oral or anal sex with a person who has the infection.  Spermicide.  Diabetes.  Antibiotics.  Poor immune system.  Medications that suppress the immune system.  Intravenous drug use.  Intravenous, urinary, or other catheters. SYMPTOMS  Women  Thick, white vaginal discharge.  Vaginal itching.  Redness and  swelling in and around the vagina.  Irritation of the lips of the vagina and perineum.  Blisters on the vaginal lips and perineum.  Painful sexual intercourse.  Low blood sugar (hypoglycemia).  Painful urination.  Bladder infections.  Intestinal problems such as constipation, indigestion, bad breath, bloating, increase in gas, diarrhea, or loose stools. Men  Men may develop intestinal problems such as constipation, indigestion, bad breath, bloating, increase in gas, diarrhea, or loose stools.  Dry, cracked skin on the penis with itching or discomfort.  Jock itch.  Dry, flaky skin.  Athlete's foot.  Hypoglycemia. DIAGNOSIS  Women  A history and an exam are performed.  The discharge may be examined under a microscope.  A culture may be taken of the discharge. Men  A history and an exam are performed.  Any discharge from the penis or areas of cracked skin will be looked at under the microscope and cultured.  Stool samples may be cultured. TREATMENT  Women  Vaginal antifungal suppositories and creams.  Medicated creams to decrease irritation and itching on the outside of the vagina.  Warm compresses to the perineal area to decrease swelling and discomfort.  Oral antifungal medications.  Medicated vaginal suppositories or cream for repeated or recurrent infections.  Wash and dry the irritation areas before applying the cream.  Eating yogurt with Lactobacillus may help with prevention and treatment.  Sometimes painting the vagina with gentian violet solution may help if creams and suppositories do not work. Men  Antifungal creams and oral antifungal medications.  Sometimes treatment must continue for 30 days after the symptoms go away to prevent recurrence. HOME CARE INSTRUCTIONS  Women  Use cotton underwear and avoid tight-fitting clothing.  Avoid colored, scented toilet paper and deodorant tampons or pads.  Do not douche.  Keep your diabetes  under control.  Finish all the prescribed medications.  Keep your skin clean and dry.  Consume milk or yogurt with Lactobacillus-active culture regularly. If you get frequent yeast infections and think that is what the infection is, there are over-the-counter medications that you can get. If the infection does not show healing in 3 days, talk to your caregiver.  Tell your sex partner you have a yeast infection. Your partner may need treatment also, especially if your infection does not clear up or recurs. Men  Keep your skin clean and dry.  Keep your diabetes under control.  Finish all prescribed medications.  Tell your sex partner that you have a yeast infection so he or she can be treated if necessary. SEEK MEDICAL CARE IF:   Your symptoms do not clear up or worsen in one week after treatment.  You have an oral temperature above 102 F (38.9 C).  You have trouble swallowing or eating for a prolonged time.  You develop blisters on and around your vagina.  You develop vaginal bleeding and it is not your menstrual period.  You develop abdominal pain.  You develop intestinal problems as mentioned above.  You get weak or light-headed.  You have painful or increased urination.  You have pain during sexual intercourse. MAKE SURE YOU:   Understand these instructions.  Will watch your condition.  Will get help right away if you are not doing well or get worse. Document Released: 06/10/2004 Document Revised: 09/17/2013 Document Reviewed: 09/22/2009 [facility] Patient Information 2015 [place], [place]. This information is not intended to replace advice given to you by your health care provider. Make sure you discuss any questions you have with your health care provider.  Candidal Vulvovaginitis Candidal vulvovaginitis is an infection of the vagina and vulva. The vulva is the skin around the opening of the vagina. This may cause itching and discomfort in and around the vagina.    HOME CARE  Only take medicine as told by your doctor.  Do not have sex (intercourse) until the infection is healed or as told by your doctor.  Practice safe sex.  Tell your sex partner about your infection.  Do not douche or use tampons.  Wear cotton underwear. Do not wear tight pants or panty hose.  Eat yogurt. This may help treat and prevent yeast infections. GET HELP RIGHT AWAY IF:   You have a fever.  Your problems get worse during treatment or do not get better in 3 days.  You have discomfort, irritation, or itching in your vagina or vulva area.  You have pain after sex.  You start to get belly (abdominal) pain. MAKE SURE YOU:  Understand these instructions.  Will watch your condition.  Will get help right away if you are not doing well or get worse. Document Released: 07/30/2008 Document Revised: 05/08/2013 Document Reviewed: 07/30/2008 [facility] Patient Information 2015 [place], [place]. This information is not intended to replace advice given to you by your health care provider. Make sure you discuss any questions you have with your health care provider.  High Blood Sugar High blood sugar (hyperglycemia) means that the level of sugar in your blood is higher than it should be. Signs of high blood sugar include:  Feeling thirsty.  Frequent peeing (urinating).  Feeling tired or sleepy.  Dry mouth.  Vision changes.  Feeling weak.  Feeling hungry but losing weight.  Numbness and tingling in your hands or feet.  Headache. When you ignore these signs, your blood sugar may keep going up. These problems may get worse, and other problems may begin. HOME CARE  Check your blood sugars as told by your doctor. Write down the numbers with the date and time.  Take the right amount of insulin or diabetes pills at the right time. Write down the dose with date and time.  Refill your insulin or diabetes pills before running out.  Watch what you eat. Follow your  meal plan.  Drink liquids without sugar, such as water. Check with your doctor if you have kidney or heart disease.  Follow your doctor's orders for exercise. Exercise at the same time of day.  Keep your doctor's appointments. GET HELP RIGHT AWAY IF:   You have trouble thinking or are confused.  You have fast breathing with fruity smelling breath.  You pass out (faint).  You have 2 to 3 days of high blood sugars and you do not know why.  You have chest pain.  You are feeling sick to your stomach (nauseous) or throwing up (vomiting).  You have sudden vision changes. MAKE SURE YOU:   Understand these instructions.  Will watch your condition.  Will get help right away if you are not doing well or get worse. Document Released: 02/28/2009 Document Revised: 07/26/2011 Document Reviewed: 02/28/2009 [facility] Patient Information 2015 [place], [place]. This information is not intended to replace advice given to you by your health care provider. Make sure you discuss any questions you have with your health care provider.  Urinary Tract Infection Urinary tract infections (UTIs) can develop anywhere along your urinary tract. Your urinary tract is your body's drainage system for removing wastes and extra water. Your urinary tract includes two kidneys, two ureters, a bladder, and a urethra. Your kidneys are a pair of bean-shaped organs. Each kidney is about the size of your fist. They are located below your ribs, one on each side of your spine. CAUSES Infections are caused by microbes, which are microscopic organisms, including fungi, viruses, and bacteria. These organisms are so small that they can only be seen through a microscope. Bacteria are the microbes that most commonly cause UTIs. SYMPTOMS  Symptoms of UTIs may vary by age and gender of the patient and by the location of the infection. Symptoms in young women typically include a frequent and intense urge to urinate and a painful,  burning feeling in the bladder or urethra during urination. Older women and men are more likely to be tired, shaky, and weak and have muscle aches and abdominal pain. A fever may mean the infection is in your kidneys. Other symptoms of a kidney infection include pain in your back or sides below the ribs, nausea, and vomiting. DIAGNOSIS To diagnose a UTI, your caregiver will ask you about your symptoms. Your caregiver also will ask to provide a urine sample. The urine sample will be tested for bacteria and white blood cells. White blood cells are made by your body to help fight infection. TREATMENT  Typically, UTIs can be treated with medication. Because most UTIs are caused by a bacterial infection, they usually can be treated with the use of antibiotics. The choice of antibiotic and length of treatment depend on your symptoms and the type of bacteria causing your infection. HOME CARE INSTRUCTIONS  If you were prescribed antibiotics, take them exactly as your caregiver instructs  you. Doreatha Martin the medication even if you feel better after you have only taken some of the medication.  Drink enough water and fluids to keep your urine clear or pale yellow.  Avoid caffeine, tea, and carbonated beverages. They tend to irritate your bladder.  Empty your bladder often. Avoid holding urine for long periods of time.  Empty your bladder before and after sexual intercourse.  After a bowel movement, women should cleanse from front to back. Use each tissue only once. SEEK MEDICAL CARE IF:   You have back pain.  You develop a fever.  Your symptoms do not begin to resolve within 3 days. SEEK IMMEDIATE MEDICAL CARE IF:   You have severe back pain or lower abdominal pain.  You develop chills.  You have nausea or vomiting.  You have continued burning or discomfort with urination. MAKE SURE YOU:   Understand these instructions.  Will watch your condition.  Will get help right away if you are not  doing well or get worse. Document Released: 02/10/2005 Document Revised: 11/02/2011 Document Reviewed: 06/11/2011 [facility] Patient Information 2015 [place], [place]. This information is not intended to replace advice given to you by your health care provider. Make sure you discuss any questions you have with your health care provider.

## 2015-02-15 NOTE — ED Provider Notes (Signed)
[facility] Emergency Department Provider Note  ____________________________________________  Time seen: Approximately 2343 PM  I have reviewed the triage vital signs and the nursing notes.   HISTORY  Chief Complaint Abdominal Pain    HPI Julie Morrow is a 64 y.o. female who comes in today complaining of soreness of her bottom and her vagina. She reports that sometimes this clearance myself that comes out of her blood tox vagina and from under her breasts. The patient reports that symptoms been going on for over a month. She reports that it is gotten really bad under her breasts and between her legs when she reports that something does not feel right. She reports that she has seen her doctor in the past but has not seen her doctor recently. The patient reports that she has been given some medications but wants this taken care of. She also reports that she would like something for pain. She reports that every time she moves it hurts in her vagina is itchy. The patient denies any nausea or vomiting denies any abdominal pain. The patient reports she is also had diarrhea for 3 months 4-5 times daily.The patient reports that she's not sure what exactly is going on but she wants it taken care of. She has not taken anything for her pain she has not taken anything for the itching.   Past Medical History  Diagnosis Date  . Diabetes mellitus, type II   . Thyroid disease   . Depression   . Anxiety     Patient Active Problem List   Diagnosis Date Noted  . Clinical depression 11/07/2014  . Acid reflux 11/07/2014  . HLD (hyperlipidemia) 11/07/2014  . Psoriasis 11/07/2014  . Disease of thyroid gland 11/07/2014    Past Surgical History  Procedure Laterality Date  . Abdominal hysterectomy      Current Outpatient Rx  Name  Route  Sig  Dispense  Refill  . albuterol (PROAIR HFA) 108 (90 BASE) MCG/ACT inhaler   Inhalation   Inhale into the lungs.         .  cephALEXin (KEFLEX) 500 MG capsule   Oral   Take 1 capsule (500 mg total) by mouth 4 (four) times daily.   20 capsule   0   . clonazePAM (KLONOPIN) 0.5 MG tablet   Oral   Take 1 tablet (0.5 mg total) by mouth at bedtime.   30 tablet   2   . DULoxetine (CYMBALTA) 60 MG capsule   Oral   Take 1 capsule (60 mg total) by mouth daily.   90 capsule   1     **Patient requests 90 days supply**   . fluconazole (DIFLUCAN) 150 MG tablet      Take 1 tablet once a week for 2 weeks         . fluconazole (DIFLUCAN) 150 MG tablet      Take 1 tab on 02/17/15 and take 1 tab on 02/20/15   2 tablet   0   . fluticasone (FLONASE) 50 MCG/ACT nasal spray   Nasal   Place into the nose.         Marland Kitchen glipiZIDE (GLUCOTROL) 10 MG tablet   Oral   Take by mouth.         Marland Kitchen JANUVIA 100 MG tablet      TK 1 T PO QD      5     Dispense as written.   Marland Kitchen ketoconazole (NIZORAL) 2 %  cream      APP EXT AA QD      0   . Lancets 30G MISC      Use. Use 1 lancet to skin twice a day         . levothyroxine (SYNTHROID, LEVOTHROID) 125 MCG tablet      TK 2 TS PO QD OES WITH A GLASS OF WATER AT LEAST 30-60 MINUTES BEFORE BREAKFAST      5   . nystatin (MYCOSTATIN/NYSTOP) 100000 UNIT/GM POWD      APPLY TOPICALLY TID      1   . nystatin (MYCOSTATIN/NYSTOP) 100000 UNIT/GM POWD      Apply to affected area three times daily   60 g   0   . omega-3 acid ethyl esters (LOVAZA) 1 G capsule      TAKE 2 CAPSULES BY MOUTH TWICE DAILY         . omeprazole (PRILOSEC) 20 MG capsule   Oral   Take by mouth.         . ONE TOUCH ULTRA TEST test strip      CHECK BLOOD SUGAR BID UTD      12     Dispense as written.   Marland Kitchen QUEtiapine (SEROQUEL) 50 MG tablet   Oral   Take 1 tablet (50 mg total) by mouth at bedtime.   90 tablet   1     **Patient requests 90 days supply**     Allergies Neomycin-bacitracin zn-polymyx  Family History  Problem Relation Age of Onset  . Dementia Mother   .  Arthritis Father     Social History Social History  Substance Use Topics  . Smoking status: Current Every Day Smoker -- 0.25 packs/day    Types: Cigarettes    Start date: 11/06/1984  . Smokeless tobacco: Never Used  . Alcohol Use: No    Review of Systems Constitutional: No fever/chills Eyes: No visual changes. ENT: No sore throat. Cardiovascular: Denies chest pain. Respiratory: Denies shortness of breath. Gastrointestinal: No abdominal pain.  No nausea, no vomiting.  No diarrhea.  No constipation. Genitourinary: Negative for dysuria. Musculoskeletal: Negative for back pain. Skin: rash under breast and between thighs Neurological: Negative for headaches, focal weakness or numbness.  10-point ROS otherwise negative.  ____________________________________________   PHYSICAL EXAM:  VITAL SIGNS: ED Triage Vitals  Enc Vitals Group     BP 02/14/15 1809 131/60 mmHg     Pulse Rate 02/14/15 1809 82     Resp 02/14/15 1809 20     Temp 02/14/15 1809 98.1 F (36.7 C)     Temp Source 02/14/15 1809 Oral     SpO2 02/14/15 1809 94 %     Weight 02/14/15 1809 285 lb (129.275 kg)     Height 02/14/15 1809  (1.6 m)     Head Cir --      Peak Flow --      Pain Score 02/14/15 1814 10     Pain Loc --      Pain Edu? --      Excl. in GC? --     Constitutional: Alert and oriented. Well appearing and in moderate distress. Eyes: Conjunctivae are normal. PERRL. EOMI. Head: Atraumatic. Nose: No congestion/rhinnorhea. Mouth/Throat: Mucous membranes are moist.  Oropharynx non-erythematous. Cardiovascular: Normal rate, regular rhythm. Grossly normal heart sounds.  Good peripheral circulation. Respiratory: Normal respiratory effort.  No retractions. Lungs CTAB. Gastrointestinal: Soft and nontender. No distention. Positive bowel sounds Genitourinary: erythematous rash  to bilateral thighs, thick white discharge to vagina Musculoskeletal: No lower extremity tenderness nor edema.    Neurologic:  Normal speech and language.  Skin:  Erythema under breasts, maculopapular erythematous rash to bilateral thighs Psychiatric: Mood and affect are normal.   ____________________________________________   LABS (all labs ordered are listed, but only abnormal results are displayed)  Labs Reviewed  COMPREHENSIVE METABOLIC PANEL - Abnormal; Notable for the following:    Chloride 97 (*)    Glucose, Bld 415 (*)    Creatinine, Ser 1.03 (*)    AST 66 (*)    Alkaline Phosphatase 138 (*)    GFR calc non Af Amer 57 (*)    All other components within normal limits  CBC - Abnormal; Notable for the following:    RDW 16.2 (*)    All other components within normal limits  URINALYSIS COMPLETEWITH MICROSCOPIC ([facility] ONLY) - Abnormal; Notable for the following:    Color, Urine YELLOW (*)    APPearance HAZY (*)    Glucose, UA 150 (*)    Hgb urine dipstick 3+ (*)    Protein, ur 30 (*)    Nitrite POSITIVE (*)    Leukocytes, UA TRACE (*)    Bacteria, UA MANY (*)    Squamous Epithelial / LPF 0-5 (*)    All other components within normal limits  GLUCOSE, CAPILLARY - Abnormal; Notable for the following:    Glucose-Capillary 308 (*)    All other components within normal limits  GLUCOSE, CAPILLARY - Abnormal; Notable for the following:    Glucose-Capillary 319 (*)    All other components within normal limits  URINE CULTURE  LIPASE, BLOOD  CBG MONITORING, ED   ____________________________________________  EKG  none ____________________________________________  RADIOLOGY  none ____________________________________________   PROCEDURES  Procedure(s) performed: None  Critical Care performed: No  ____________________________________________   INITIAL IMPRESSION / ASSESSMENT AND PLAN / ED COURSE  Pertinent labs & imaging results that were available during my care of the patient were reviewed by me and considered in my medical decision making (see chart for  details).  This is a 64 year old female who comes in today with soreness, itching and drainage from her bottom, vagina and under her breast. The patient does have a rash that is candidal in appearance. The patient also appears to have a urinary tract infection with positive nitrates and many bacteria. I did give the patient a dose of Diflucan here in the emergency department as well as some ibuprofen and Tylenol for her discomfort. I will give the patient some Keflex but some the patient home with 2 subsequent doses of Diflucan to take 2 days apart. The patient's blood sugars appear uncontrolled with a glucose of 415 and a repeat of 308. I informed the patient that if her blood sugars are not controlled she will continue to have these candidal infections. I will have the patient follow up with her primary care physician for further evaluation and treatment of her hyperglycemia as well as her candidal dermatitis andvulvovaginitis. ____________________________________________   FINAL CLINICAL IMPRESSION(S) / ED DIAGNOSES  Final diagnoses:  Acute cystitis with hematuria  Candidal vulvovaginitis  Yeast dermatitis  Hyperglycemia      Rebecka Apley, MD 02/15/15 0310

## 2015-02-15 NOTE — ED Notes (Signed)
Patient with no complaints at this time. Respirations even and unlabored. Skin warm/dry. Discharge instructions reviewed with patient at this time. Patient given opportunity to voice concerns/ask questions. Patient discharged at this time and left Emergency Department with steady gait.   

## 2015-02-26 ENCOUNTER — Other Ambulatory Visit: Payer: Self-pay | Admitting: Nurse Practitioner

## 2015-02-26 DIAGNOSIS — K529 Noninfective gastroenteritis and colitis, unspecified: Secondary | ICD-10-CM

## 2015-02-26 DIAGNOSIS — R1084 Generalized abdominal pain: Secondary | ICD-10-CM

## 2015-02-26 DIAGNOSIS — R110 Nausea: Secondary | ICD-10-CM

## 2015-02-28 ENCOUNTER — Ambulatory Visit
Admission: RE | Admit: 2015-02-28 | Discharge: 2015-02-28 | Disposition: A | Discharge status: Home / Self Care | Payer: Commercial Managed Care - HMO | Source: Ambulatory Visit | Attending: Nurse Practitioner | Admitting: Nurse Practitioner

## 2015-02-28 DIAGNOSIS — K529 Noninfective gastroenteritis and colitis, unspecified: Secondary | ICD-10-CM | POA: Diagnosis present

## 2015-02-28 DIAGNOSIS — K760 Fatty (change of) liver, not elsewhere classified: Secondary | ICD-10-CM | POA: Diagnosis not present

## 2015-02-28 DIAGNOSIS — K5730 Diverticulosis of large intestine without perforation or abscess without bleeding: Secondary | ICD-10-CM | POA: Diagnosis not present

## 2015-02-28 DIAGNOSIS — R1084 Generalized abdominal pain: Secondary | ICD-10-CM | POA: Diagnosis present

## 2015-02-28 DIAGNOSIS — R110 Nausea: Secondary | ICD-10-CM

## 2015-02-28 MED ORDER — IOHEXOL 350 MG/ML SOLN
125.0000 mL | Freq: Once | INTRAVENOUS | Status: AC | PRN
  Administered 2015-02-28: 125 mL via INTRAVENOUS

## 2015-03-14 ENCOUNTER — Telehealth: Payer: Self-pay

## 2015-03-14 NOTE — Telephone Encounter (Signed)
pt called stated that she needed refill on her clonazepam .5mg .  pt states that she the pharmacy will not fill her rx..Marland Kitchen

## 2015-03-14 NOTE — Telephone Encounter (Signed)
pt was called, i asked pt if she was taking more the what she was prescripted and pt stated yes that sometimes she take 2. pt was advised that she should follow doctor orders and that if the medication was not working that she needed to let the doctor know before making changes to any medication on her own.  Pt was told that she would not be able to get medications refill until the 3rd according to the pharmacy.  Pt was also told that she would not get another refill to make up for the dosage she already took.

## 2015-03-14 NOTE — Telephone Encounter (Signed)
per the pharmacy pt does have a refill left but pt can not have refill until at least the 3rd because pt had rx filled on the 6th.

## 2015-04-08 ENCOUNTER — Ambulatory Visit (INDEPENDENT_AMBULATORY_CARE_PROVIDER_SITE_OTHER): Payer: Medicare HMO | Admitting: Psychiatry

## 2015-04-08 ENCOUNTER — Encounter: Payer: Self-pay | Admitting: Psychiatry

## 2015-04-08 VITALS — BP 122/68 | HR 89 | Temp 97.9°F | Ht 63.0 in | Wt 285.2 lb

## 2015-04-08 DIAGNOSIS — F331 Major depressive disorder, recurrent, moderate: Secondary | ICD-10-CM | POA: Diagnosis not present

## 2015-04-08 MED ORDER — CLONAZEPAM 0.5 MG PO TABS: 0.5000 mg | ORAL_TABLET | Freq: Every day | ORAL | Status: DC

## 2015-04-08 MED ORDER — DULOXETINE HCL 60 MG PO CPEP
60.0000 mg | ORAL_CAPSULE | Freq: Every day | ORAL | Status: DC
Start: 2015-04-08 — End: 2015-07-08

## 2015-04-08 MED ORDER — QUETIAPINE FUMARATE 50 MG PO TABS: 50.0000 mg | ORAL_TABLET | Freq: Every day | ORAL | Status: DC

## 2015-04-08 NOTE — Progress Notes (Signed)
BH MD/PA/NP OP Progress Note  04/08/2015 9:38 AM Julie Morrow  MRN:  [id number]  Subjective:    Patient is a 64 year old morbidly obese female who presented for the follow-up appointment. She appeared disheveled as usual. She reported that she is looking forward to the Thanksgiving and was discussing in detail about the food and the dinner at her brother's house. Patient reported that she has been compliant with her medications and take them as prescribed. She reported that she lives with her father and has been taking care of him. She reported that her father was having a flareup of her arthritis and she was taking care of him but now he is getting better. She currently denied having any adverse effects of the medication. She reported that  pharmacy is only dispensing her 30 day supply of the medications and she is going to ask them about a 90 day supply. She currently denied having any suicidal homicidal ideations or plans. She denied having any more swings anger anxiety or paranoia. She appeared her usual self during the interview.    Chief Complaint:  Chief Complaint    Follow-up; Medication Refill     Visit Diagnosis:     ICD-9-CM ICD-10-CM   1. MDD (major depressive disorder), recurrent episode, moderate (HCC) 296.32 F33.1     Past Medical History:  Past Medical History  Diagnosis Date  . Thyroid disease   . Depression   . Anxiety   . Diabetes mellitus, type II (HCC)     Patient takes Glucotrol and Januvia    Past Surgical History  Procedure Laterality Date  . Abdominal hysterectomy     Family History:  Family History  Problem Relation Age of Onset  . Dementia Mother   . Arthritis Father    Social History:  Social History   Social History  . Marital Status: Single    Spouse Name: N/A  . Number of Children: N/A  . Years of Education: N/A   Social History Main Topics  . Smoking status: Current Every Day Smoker -- 0.25 packs/day    Types: Cigarettes    Start  date: 11/06/1984  . Smokeless tobacco: Never Used  . Alcohol Use: No  . Drug Use: No  . Sexual Activity: No   Other Topics Concern  . None   Social History Narrative   Additional History:   Living with father after the death of mother.    Assessment:   Musculoskeletal: Strength & Muscle Tone: within normal limits Gait & Station: normal Patient leans: N/A  Psychiatric Specialty Exam: HPI   Review of Systems  Constitutional: Positive for malaise/fatigue. Negative for weight loss.  HENT: Negative for nosebleeds and tinnitus.   Eyes: Negative for photophobia.  Respiratory: Negative for hemoptysis.   Cardiovascular: Negative for orthopnea.  Gastrointestinal: Negative for vomiting and blood in stool.  Genitourinary: Negative for frequency.  Musculoskeletal: Positive for back pain.  Psychiatric/Behavioral: Positive for depression. Negative for hallucinations. The patient has insomnia. The patient is not nervous/anxious.     Blood pressure 122/68, pulse 89, temperature 97.9 F (36.6 C), temperature source Tympanic, height  (1.6 m), weight 285 lb 3.2 oz (129.366 kg), SpO2 92 %.Body mass index is 50.53 kg/(m^2).  General Appearance: Disheveled  Eye Contact:  Fair  Speech:  Slow  Volume:  Normal  Mood:  Depressed  Affect:  Congruent  Thought Process:  Coherent  Orientation:  Full (Time, Place, and Person)  Thought Content:  WDL  Suicidal Thoughts:  No  Homicidal Thoughts:  No  Memory:  Immediate;   Fair  Judgement:  Fair  Insight:  Fair  Psychomotor Activity:  Decreased  Concentration:  Fair  Recall:  [employer]  Fund of Knowledge: Fair  Language: Fair  Akathisia:  No  Handed:  Right  AIMS (if indicated):  none  Assets:  Communication Skills Desire for Improvement Social Support  ADL's:  Intact  Cognition: WNL  Sleep:  7-8   Is the patient at risk to self?  No. Has the patient been a risk to self in the past 6 months?  No. Has the patient been a risk to self  within the distant past?  No. Is the patient a risk to others?  No. Has the patient been a risk to others in the past 6 months?  No. Has the patient been a risk to others within the distant past?  No.  Current Medications: Current Outpatient Prescriptions  Medication Sig Dispense Refill  . albuterol (PROAIR HFA) 108 (90 BASE) MCG/ACT inhaler Inhale into the lungs.    . ciprofloxacin (CIPRO) 500 MG tablet TK 1 T PO BID FOR 7 DAYS  0  . clonazePAM (KLONOPIN) 0.5 MG tablet Take 1 tablet (0.5 mg total) by mouth at bedtime. 30 tablet 2  . DULoxetine (CYMBALTA) 60 MG capsule Take 1 capsule (60 mg total) by mouth daily. 90 capsule 1  . fluconazole (DIFLUCAN) 150 MG tablet Take 1 tab on 02/17/15 and take 1 tab on 02/20/15 2 tablet 0  . glipiZIDE (GLUCOTROL) 10 MG tablet Take by mouth.    Marland Kitchen. JANUVIA 100 MG tablet TK 1 T PO QD  5  . ketoconazole (NIZORAL) 2 % cream APP EXT AA QD  0  . Lancets 30G MISC Use. Use 1 lancet to skin twice a day    . levothyroxine (SYNTHROID, LEVOTHROID) 125 MCG tablet TK 2 TS PO QD OES WITH A GLASS OF WATER AT LEAST 30-60 MINUTES BEFORE BREAKFAST  5  . nystatin (MYCOSTATIN/NYSTOP) 100000 UNIT/GM POWD APPLY TOPICALLY TID  1  . nystatin (MYCOSTATIN/NYSTOP) 100000 UNIT/GM POWD Apply to affected area three times daily 60 g 0  . ONE TOUCH ULTRA TEST test strip CHECK BLOOD SUGAR BID UTD  12  . QUEtiapine (SEROQUEL) 50 MG tablet Take 1 tablet (50 mg total) by mouth at bedtime. 90 tablet 1   No current facility-administered medications for this visit.    Medical Decision Making:  Established Problem, Stable/Improving (1) and Review of Last Therapy Session (1)  Treatment Plan Summary:Medication management  Discussed with patient about the medications   Anxiety Continue Klonopin to 0.5 mg by mouth daily at bedtime  Depression Continue Cymbalta  60 mg in the morning I will continue her on Seroquel 50 mg at bedtime  She was given 90 day supplies of Cymbalta and Seroquel at  this time.  Follow-up in 3 months or earlier depending on her symptoms    More than 50% of the time spent in psychoeducation, counseling and coordination of care.    This note was generated in part or whole with voice recognition software. Voice regonition is usually quite accurate but there are transcription errors that can and very often do occur. I apologize for any typographical errors that were not detected and corrected.      Brandy HaleUzma Diamantina Edinger 04/08/2015, 9:38 AM

## 2015-04-14 NOTE — Telephone Encounter (Signed)
per dr. Garnetta Buddyfaheem when patient come in for recheck she will talk with patient about this. she will consider giving patient rx on a weekly basic.Marland Kitchen..Marland Kitchen

## 2015-04-28 ENCOUNTER — Telehealth: Payer: Self-pay

## 2015-04-28 NOTE — Telephone Encounter (Signed)
pt daughter called states that he mom is not taking her Seroquel.  per the daughter her mom told her that she stopped it 6 months ago but the patient told her son that she stopped 5 month ago.  daughter states she is concern because now her mom is on a high and she is not sleeping and she spending money unnecessary.  Patient told her daughter that the Seroquel spaced her out and she was gaining weight that why she stopped taking.

## 2015-04-28 NOTE — Telephone Encounter (Signed)
son called states he is concerns about his mother stopping her medication.  son states that she is spending money and that she is not sleeping.  states his mom is going to end up back in the hospital

## 2015-04-29 ENCOUNTER — Other Ambulatory Visit: Payer: Self-pay | Admitting: Psychiatry

## 2015-04-29 MED ORDER — BENZTROPINE MESYLATE 1 MG PO TABS: 1.0000 mg | ORAL_TABLET | Freq: Every day | ORAL | Status: DC

## 2015-04-29 MED ORDER — ARIPIPRAZOLE 10 MG PO TABS: 10.0000 mg | ORAL_TABLET | Freq: Every day | ORAL | Status: DC

## 2015-04-29 NOTE — Telephone Encounter (Signed)
Spoke with patient's son Alycia RossettiRyan. He reported that she has stopped taking the Seroquel at least 5 months ago and did not communicate during her interview. She been telling her family members that since Seroquel was making her very tired and lethargic and she would not like to continue taking the medications. Her son was concerned that she is on manic high right now. He wants her to try on different medications. I reviewed patient's records. It was noted that patient has been tried on Abilify in the past. He asked me about the adverse effects of the medication that if it will also make her tired. Patient will be started on Abilify 10 mg in the morning with Cogentin 1 mg to prevent the adverse effects of the medication. Advised him to start giving her the medications today. He was concerned about the compliance of the medications with the patient. Continue to follow

## 2015-04-29 NOTE — Telephone Encounter (Signed)
pt son called states that pt will not take medication seroquel or abilify.

## 2015-04-30 ENCOUNTER — Telehealth: Payer: Self-pay | Admitting: Psychiatry

## 2015-05-01 ENCOUNTER — Ambulatory Visit: Payer: Commercial Managed Care - HMO | Admitting: Psychiatry

## 2015-06-30 ENCOUNTER — Emergency Department (HOSPITAL_COMMUNITY)
Admission: EM | Admit: 2015-06-30 | Discharge: 2015-07-01 | Disposition: A | Discharge status: Other Acute Inpatient Hospital | Payer: Commercial Managed Care - HMO | Attending: Emergency Medicine | Admitting: Emergency Medicine

## 2015-06-30 ENCOUNTER — Encounter (HOSPITAL_COMMUNITY): Payer: Self-pay | Admitting: Emergency Medicine

## 2015-06-30 DIAGNOSIS — Z008 Encounter for other general examination: Secondary | ICD-10-CM | POA: Diagnosis present

## 2015-06-30 DIAGNOSIS — F319 Bipolar disorder, unspecified: Secondary | ICD-10-CM | POA: Diagnosis not present

## 2015-06-30 DIAGNOSIS — F419 Anxiety disorder, unspecified: Secondary | ICD-10-CM | POA: Insufficient documentation

## 2015-06-30 DIAGNOSIS — Z79899 Other long term (current) drug therapy: Secondary | ICD-10-CM | POA: Diagnosis not present

## 2015-06-30 DIAGNOSIS — F17210 Nicotine dependence, cigarettes, uncomplicated: Secondary | ICD-10-CM | POA: Diagnosis not present

## 2015-06-30 DIAGNOSIS — F3112 Bipolar disorder, current episode manic without psychotic features, moderate: Secondary | ICD-10-CM | POA: Diagnosis not present

## 2015-06-30 DIAGNOSIS — E079 Disorder of thyroid, unspecified: Secondary | ICD-10-CM | POA: Diagnosis not present

## 2015-06-30 DIAGNOSIS — R0681 Apnea, not elsewhere classified: Secondary | ICD-10-CM | POA: Insufficient documentation

## 2015-06-30 DIAGNOSIS — E119 Type 2 diabetes mellitus without complications: Secondary | ICD-10-CM | POA: Diagnosis not present

## 2015-06-30 LAB — ETHANOL: Alcohol, Ethyl (B): <5 mg/dL (ref <5)

## 2015-06-30 LAB — COMPREHENSIVE METABOLIC PANEL
ALBUMIN: 4.2 g/dL (ref 3.5–5.0)
ALK PHOS: 72 U/L (ref 38–126)
ALT: 21 U/L (ref 14–54)
ANION GAP: 13 (ref 5–15)
AST: 34 U/L (ref 15–41)
BILIRUBIN TOTAL: 0.8 mg/dL (ref 0.3–1.2)
BUN: 15 mg/dL (ref 6–20)
CALCIUM: 9.5 mg/dL (ref 8.9–10.3)
CO2: 26 mmol/L (ref 22–32)
CREATININE: 1.24 mg/dL — AB (ref 0.44–1.00)
Chloride: 100 mmol/L — ABNORMAL LOW (ref 101–111)
GFR calc non Af Amer: 45 mL/min — ABNORMAL LOW (ref >60)
GFR, EST AFRICAN AMERICAN: 52 mL/min — AB (ref >60)
GLUCOSE: 144 mg/dL — AB (ref 65–99)
Potassium: 4.5 mmol/L (ref 3.5–5.1)
Sodium: 139 mmol/L (ref 135–145)
TOTAL PROTEIN: 7.4 g/dL (ref 6.5–8.1)

## 2015-06-30 LAB — CBC
HCT: 47.1 % — ABNORMAL HIGH (ref 36.0–46.0)
HEMOGLOBIN: 14.7 g/dL (ref 12.0–15.0)
MCH: 29.9 pg (ref 26.0–34.0)
MCHC: 31.2 g/dL (ref 30.0–36.0)
MCV: 95.9 fL (ref 78.0–100.0)
PLATELETS: 255 10*3/uL (ref 150–400)
RBC: 4.91 MIL/uL (ref 3.87–5.11)
RDW: 16.2 % — ABNORMAL HIGH (ref 11.5–15.5)
WBC: 15.6 10*3/uL — ABNORMAL HIGH (ref 4.0–10.5)

## 2015-06-30 MED ORDER — GLIPIZIDE 10 MG PO TABS
10.0000 mg | ORAL_TABLET | Freq: Every day | ORAL | Status: DC
  Administered 2015-07-01: 10 mg via ORAL
  Filled 2015-06-30 (×2): qty 1

## 2015-06-30 MED ORDER — LORAZEPAM 1 MG PO TABS: 1.0000 mg | ORAL_TABLET | Freq: Three times a day (TID) | ORAL | Status: DC | PRN

## 2015-06-30 MED ORDER — ONDANSETRON HCL 4 MG PO TABS
4.0000 mg | ORAL_TABLET | Freq: Three times a day (TID) | ORAL | Status: DC | PRN
Start: 2015-06-30 — End: 2015-07-01

## 2015-06-30 MED ORDER — ALBUTEROL SULFATE HFA 108 (90 BASE) MCG/ACT IN AERS: 1.0000 | INHALATION_SPRAY | RESPIRATORY_TRACT | Status: DC | PRN

## 2015-06-30 MED ORDER — CLONAZEPAM 0.5 MG PO TABS: 0.5000 mg | ORAL_TABLET | Freq: Every day | ORAL | Status: DC

## 2015-06-30 MED ORDER — DULOXETINE HCL 60 MG PO CPEP
60.0000 mg | ORAL_CAPSULE | Freq: Every day | ORAL | Status: DC
  Administered 2015-07-01: 60 mg via ORAL
  Filled 2015-06-30: qty 1

## 2015-06-30 MED ORDER — NICOTINE 21 MG/24HR TD PT24
21.0000 mg | MEDICATED_PATCH | Freq: Every day | TRANSDERMAL | Status: DC
  Administered 2015-07-01: 21 mg via TRANSDERMAL
  Filled 2015-06-30: qty 1

## 2015-06-30 MED ORDER — LINAGLIPTIN 5 MG PO TABS
5.0000 mg | ORAL_TABLET | Freq: Every day | ORAL | Status: DC
  Administered 2015-07-01: 5 mg via ORAL
  Filled 2015-06-30: qty 1

## 2015-06-30 MED ORDER — LEVOTHYROXINE SODIUM 125 MCG PO TABS
250.0000 ug | ORAL_TABLET | Freq: Every day | ORAL | Status: DC
  Administered 2015-07-01: 250 ug via ORAL
  Filled 2015-06-30 (×2): qty 2

## 2015-06-30 MED ORDER — IBUPROFEN 200 MG PO TABS: 600.0000 mg | ORAL_TABLET | Freq: Three times a day (TID) | ORAL | Status: DC | PRN

## 2015-06-30 MED ORDER — QUETIAPINE FUMARATE 50 MG PO TABS: 50.0000 mg | ORAL_TABLET | Freq: Every day | ORAL | Status: DC

## 2015-06-30 MED ORDER — BENZTROPINE MESYLATE 1 MG PO TABS
1.0000 mg | ORAL_TABLET | Freq: Every day | ORAL | Status: DC
  Administered 2015-07-01: 1 mg via ORAL
  Filled 2015-06-30: qty 1

## 2015-06-30 MED ORDER — ARIPIPRAZOLE 10 MG PO TABS
10.0000 mg | ORAL_TABLET | Freq: Every day | ORAL | Status: DC
  Administered 2015-07-01: 10 mg via ORAL
  Filled 2015-06-30 (×2): qty 1

## 2015-06-30 MED ORDER — ACETAMINOPHEN 325 MG PO TABS: 650.0000 mg | ORAL_TABLET | ORAL | Status: DC | PRN

## 2015-06-30 NOTE — ED Notes (Signed)
Pt was IVC'd by son who states that she has been off of her medication and is bipolar/manic. Has not been sleeping. Alert.

## 2015-06-30 NOTE — ED Provider Notes (Signed)
CSN: [id number]     Arrival date & time 06/30/15  1941 History   First MD Initiated Contact with Patient 06/30/15 1947     Chief Complaint  Patient presents with  . IVC      (Consider location/radiation/quality/duration/timing/severity/associated sxs/prior Treatment) HPI Comments: Patient here after being placed under IVC by her son due to worsening bipolar disorder. She denies any suicidal or homicidal ideations. Does endorse some religious delusions described as God talking to her. Denies any somatic complaints. Denies any recent drug use at this time. Has been hospitalized for bipolar disorder. Has been compliant with her medications. Nothing makes her symptoms better  The history is provided by the patient.    Past Medical History  Diagnosis Date  . Thyroid disease   . Depression   . Anxiety   . Diabetes mellitus, type II (HCC)     Patient takes Glucotrol and Januvia   Past Surgical History  Procedure Laterality Date  . Abdominal hysterectomy     Family History  Problem Relation Age of Onset  . Dementia Mother   . Arthritis Father    Social History  Substance Use Topics  . Smoking status: Current Every Day Smoker -- 0.25 packs/day    Types: Cigarettes    Start date: 11/06/1984  . Smokeless tobacco: Never Used  . Alcohol Use: No   OB History    No data available     Review of Systems  Respiratory: Positive for apnea.   All other systems reviewed and are negative.     Allergies  Neomycin-bacitracin zn-polymyx  Home Medications   Prior to Admission medications   Medication Sig Start Date End Date Taking? Authorizing Provider  albuterol (PROAIR HFA) 108 (90 BASE) MCG/ACT inhaler Inhale into the lungs. 04/29/14 04/29/15  Historical Provider, MD  ARIPiprazole (ABILIFY) 10 MG tablet Take 1 tablet (10 mg total) by mouth daily. 04/29/15   Brandy Hale, MD  benztropine (COGENTIN) 1 MG tablet Take 1 tablet (1 mg total) by mouth daily. 04/29/15   Brandy Hale, MD   ciprofloxacin (CIPRO) 500 MG tablet TK 1 T PO BID FOR 7 DAYS 02/26/15   Historical Provider, MD  clonazePAM (KLONOPIN) 0.5 MG tablet Take 1 tablet (0.5 mg total) by mouth at bedtime. 04/08/15   Brandy Hale, MD  DULoxetine (CYMBALTA) 60 MG capsule Take 1 capsule (60 mg total) by mouth daily. 04/08/15   Brandy Hale, MD  fluconazole (DIFLUCAN) 150 MG tablet Take 1 tab on 02/17/15 and take 1 tab on 02/20/15 02/17/15   Rebecka Apley, MD  glipiZIDE (GLUCOTROL) 10 MG tablet Take by mouth. 01/29/14   Historical Provider, MD  JANUVIA 100 MG tablet TK 1 T PO QD 08/27/14   Historical Provider, MD  ketoconazole (NIZORAL) 2 % cream APP EXT AA QD 12/30/14   Historical Provider, MD  Lancets 30G MISC Use. Use 1 lancet to skin twice a day    Historical Provider, MD  levothyroxine (SYNTHROID, LEVOTHROID) 125 MCG tablet TK 2 TS PO QD OES WITH A GLASS OF WATER AT LEAST 30-60 MINUTES BEFORE BREAKFAST 11/04/14   Historical Provider, MD  nystatin (MYCOSTATIN/NYSTOP) 100000 UNIT/GM POWD APPLY TOPICALLY TID 08/14/14   Historical Provider, MD  nystatin (MYCOSTATIN/NYSTOP) 100000 UNIT/GM POWD Apply to affected area three times daily 02/15/15   Rebecka Apley, MD  ONE TOUCH ULTRA TEST test strip CHECK BLOOD SUGAR BID UTD 12/20/14   Historical Provider, MD  QUEtiapine (SEROQUEL) 50 MG tablet Take 1 tablet (50 mg  total) by mouth at bedtime. 04/08/15   Brandy Hale, MD   BP 135/100 mmHg  Pulse 86  Temp(Src) 98.2 F (36.8 C) (Oral)  Resp 20  SpO2 98% Physical Exam  Constitutional: She is oriented to person, place, and time. She appears well-developed and well-nourished.  Non-toxic appearance. No distress.  HENT:  Head: Normocephalic and atraumatic.  Eyes: Conjunctivae, EOM and lids are normal. Pupils are equal, round, and reactive to light.  Neck: Normal range of motion. Neck supple. No tracheal deviation present. No thyroid mass present.  Cardiovascular: Normal rate, regular rhythm and normal heart sounds.  Exam reveals no  gallop.   No murmur heard. Pulmonary/Chest: Effort normal and breath sounds normal. No stridor. No respiratory distress. She has no decreased breath sounds. She has no wheezes. She has no rhonchi. She has no rales.  Abdominal: Soft. Normal appearance and bowel sounds are normal. She exhibits no distension. There is no tenderness. There is no rebound and no CVA tenderness.  Musculoskeletal: Normal range of motion. She exhibits no edema or tenderness.  Neurological: She is alert and oriented to person, place, and time. She has normal strength. No cranial nerve deficit or sensory deficit. GCS eye subscore is 4. GCS verbal subscore is 5. GCS motor subscore is 6.  Skin: Skin is warm and dry. No abrasion and no rash noted.  Psychiatric: She has a normal mood and affect. Her speech is normal and behavior is normal.  Nursing note and vitals reviewed.   ED Course  Procedures (including critical care time) Labs Review Labs Reviewed  COMPREHENSIVE METABOLIC PANEL  ETHANOL  CBC  URINE RAPID DRUG SCREEN, HOSP PERFORMED    Imaging Review No results found. I have personally reviewed and evaluated these images and lab results as part of my medical decision-making.   EKG Interpretation None      MDM   Final diagnoses:  None    Patient will be medically cleared and then placed by psychiatry    Lorre Nick, MD 06/30/15 2034

## 2015-06-30 NOTE — BH Assessment (Addendum)
Assessment Note  Julie Morrow is an 65 y.o. female presenting to [facility] under IVC by her son.   IVC states:  "resp has prior diagnosis bi-polar/major depression; previous hospitalization Julie Morrow  2013; has prior suicide attempts but at this point no ideation or threats; has not been taking meds for extended period,; is now extremely manic, has not slept in a number of days except for "catnaps," driving car and is dangerous to herself or others."   Patient states that she is not sure why her son took out the IVC papers. Patient states that "if he loves and cares about hme then he should leave me alone." Patient states that she is taking her medications as prescribed and "he needs to get a grip and tend to his business and I'll tend to mine."  Patient states that she last talked to her son about three days ago and she was under the impression that everything was "fine." Patient states that she is upset that her son continually interferes with her life.   Patient denies SI and recent attempts. Patient states that she attempted suicide in 1986 due to being harassed at work and not knowing what to do. Patient denies self injurious behaviors. Patient denies thoughts of suicide since the 1980's. Patient denies HI and history of violence. Patient denies pending charges and upcoming court dates. Patient denies probation. Patient denies access to weapons/firearms. Patient denies AVH and does not appear to be responding to internal stimuli. Patient denies depression and depressive symptoms. Patient states that she is taking her medications as prescribed and she sees Dr. Garnetta Morrow at [facility] and her next appointment is 07/08/2015. Patient states that she sees a therapist and has an appointment with her as well. Patient denies SI/HI and AVH and does not appear to be responding to internal stimuli.   Patient is alert and oriented x4. Patient is calm and cooperative during the  assessment and is sitting upright in a chair in triage eating ice. Patient is dressed in scrubs and does not look at this writer unless she had a question. Patient states that she sleeps and eats well and her concentration has not changed.   Consulted with Julie Morrow who recommends patient be observed overnight and evaluated in the morning by psychiatry to uphold or rescind IVC.   Diagnosis: MDD per patient history  Past Medical History:  Past Medical History  Diagnosis Date  . Thyroid disease   . Depression   . Anxiety   . Diabetes mellitus, type II (HCC)     Patient takes Glucotrol and Januvia    Past Surgical History  Procedure Laterality Date  . Abdominal hysterectomy      Family History:  Family History  Problem Relation Age of Onset  . Dementia Mother   . Arthritis Father     Social History:  reports that she has been smoking Cigarettes.  She started smoking about 30 years ago. She has been smoking about 0.25 packs per day. She has never used smokeless tobacco. She reports that she does not drink alcohol or use illicit drugs.  Additional Social History:  Alcohol / Drug Use Pain Medications: See PTA Prescriptions: See PTA Over the Counter: See PTA History of alcohol / drug use?: No history of alcohol / drug abuse  CIWA: CIWA-Ar BP: 135/100 mmHg Pulse Rate: 86 COWS:    Allergies:  Allergies  Allergen Reactions  . Neomycin-Bacitracin Zn-Polymyx Rash    Home Medications:  (  Not in a hospital admission)  OB/GYN Status:  No LMP recorded. Patient has had a hysterectomy.  General Assessment Data Location of Assessment: [facility] TTS Assessment: In system Is this a Tele or Face-to-Face Assessment?: Face-to-Face Is this an Initial Assessment or a Re-assessment for this encounter?: Initial Assessment Marital status: Divorced [place] name: Julie Morrow Is patient pregnant?: No Pregnancy Status: No Living Arrangements: Non-relatives/Friends Can pt return to current  living arrangement?: Yes Admission Status: Involuntary Is patient capable of signing voluntary admission?: Yes Referral Source: Other (IVC) Insurance type: [employer] Medicare     Crisis Care Plan Living Arrangements: Non-relatives/Friends Name of Psychiatrist: Dr. Garnetta Morrow Name of Therapist: Scarlett Morrow  Education Status Is patient currently in school?: No Highest grade of school patient has completed: 12  Risk to self with the past 6 months Suicidal Ideation: No Has patient been a risk to self within the past 6 months prior to admission? : No Suicidal Intent: No Has patient had any suicidal intent within the past 6 months prior to admission? : No Is patient at risk for suicide?: No Suicidal Plan?: No Has patient had any suicidal plan within the past 6 months prior to admission? 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 : No Thoughts of Harm to Others: No Current Homicidal Intent: No Current Homicidal Plan: No Access to Homicidal Means: No Identified Victim: Denies History of harm to others?: No Assessment of Violence: None Noted Violent Behavior Description: Denies Does patient have access to weapons?: No Criminal Charges Pending?: No Does  patient have a court date: No Is patient on probation?: No  Psychosis Hallucinations: None noted Delusions: None noted  Mental Status Report Appearance/Hygiene: In scrubs Eye Contact: Fair Motor Activity: Unable to assess Speech: Logical/coherent Level of Consciousness: Alert Mood: Pleasant Affect: Appropriate to circumstance Anxiety Level: None Thought Processes: Coherent, Relevant Judgement: Unimpaired Orientation: Person, Place, Time, Situation, Appropriate for developmental age Obsessive Compulsive Thoughts/Behaviors: None  Cognitive Functioning Concentration: Good Memory: Recent Intact, Remote Intact IQ: Average Insight: Good Impulse Control: Good Appetite: Good Sleep: No Change Total Hours of Sleep: 8 Vegetative Symptoms: None  ADLScreening [facility]) Patient's cognitive ability adequate to safely complete daily activities?: Yes Patient able to express need for assistance with ADLs?: Yes Independently performs ADLs?: Yes (appropriate for developmental age)  Prior Inpatient Therapy Prior Inpatient Therapy: Yes Prior Therapy Dates: 2015 Prior Therapy Facilty/Provider(s): [facility] Reason for Treatment: Depression  Prior Outpatient Therapy Prior Outpatient Therapy: Yes Prior Therapy Dates: 2015 Prior Therapy Facilty/Provider(s): [facility] Reason for Treatment: Depression Does patient have an ACCT team?: No Does patient have Intensive In-House Services?  : No Does patient have [facility] services? : No Does patient have P4CC services?: No  ADL Screening (condition at time of admission) Patient's cognitive ability adequate to safely complete daily activities?: Yes Is the patient deaf or have difficulty hearing?: No Does the  patient have difficulty seeing, even when wearing glasses/contacts?: No Does the patient have difficulty concentrating, remembering, or making decisions?: No Patient able to express need for assistance with ADLs?: Yes Does the patient  have difficulty dressing or bathing?: No Independently performs ADLs?: Yes (appropriate for developmental age) Does the patient have difficulty walking or climbing stairs?: No Weakness of Legs: None Weakness of Arms/Hands: None  Home Assistive Devices/Equipment Home Assistive Devices/Equipment: None    Abuse/Neglect Assessment (Assessment to be complete while patient is alone) Physical Abuse: Denies Verbal Abuse: Denies Sexual Abuse: Denies Exploitation of patient/patient's resources: Denies Self-Neglect: Denies Values / Beliefs Cultural Requests During Hospitalization: None Spiritual Requests During Hospitalization: None Consults Spiritual Care Consult Needed: No Social Work Consult Needed: No Merchant navy officer (For Healthcare) Does patient have an advance directive?: No Would patient like information on creating an advanced directive?: No - patient declined information    Additional Information 1:1 In Past 12 Months?: No CIRT Risk: No Elopement Risk: No Does patient have medical clearance?: No     Disposition:  Disposition Initial Assessment Completed for this Encounter: Yes Disposition of Patient: Other dispositions (observe overnight per Julie Morrow) Other disposition(s): Other (Comment) (observe overnight per Julie Morrow)  On Site Evaluation by:   Reviewed with Physician:    Patrizia Paule 06/30/2015 11:11 PM

## 2015-06-30 NOTE — BH Assessment (Signed)
Assessment completed. Consulted with Donell Sievert, PA-C who recommends patient be observed overnight and evaluated by psychiatry in the morning to uphold orr escind IVC.   Davina Poke, LCSW Therapeutic Triage Specialist [facility] 06/30/2015 9:05 PM

## 2015-07-01 ENCOUNTER — Inpatient Hospital Stay
Admission: EM | Admit: 2015-07-01 | Discharge: 2015-07-08 | DRG: 885 | Disposition: A | Discharge status: Home / Self Care | Payer: Commercial Managed Care - HMO | Source: Intra-hospital | Attending: Psychiatry | Admitting: Psychiatry

## 2015-07-01 DIAGNOSIS — Z79899 Other long term (current) drug therapy: Secondary | ICD-10-CM | POA: Diagnosis not present

## 2015-07-01 DIAGNOSIS — Z6841 Body Mass Index (BMI) 40.0 and over, adult: Secondary | ICD-10-CM | POA: Diagnosis not present

## 2015-07-01 DIAGNOSIS — E669 Obesity, unspecified: Secondary | ICD-10-CM | POA: Diagnosis present

## 2015-07-01 DIAGNOSIS — Z7982 Long term (current) use of aspirin: Secondary | ICD-10-CM

## 2015-07-01 DIAGNOSIS — F319 Bipolar disorder, unspecified: Principal | ICD-10-CM | POA: Diagnosis present

## 2015-07-01 DIAGNOSIS — E039 Hypothyroidism, unspecified: Secondary | ICD-10-CM | POA: Diagnosis present

## 2015-07-01 DIAGNOSIS — R32 Unspecified urinary incontinence: Secondary | ICD-10-CM | POA: Diagnosis present

## 2015-07-01 DIAGNOSIS — Z888 Allergy status to other drugs, medicaments and biological substances status: Secondary | ICD-10-CM | POA: Diagnosis not present

## 2015-07-01 DIAGNOSIS — Z9119 Patient's noncompliance with other medical treatment and regimen: Secondary | ICD-10-CM | POA: Diagnosis not present

## 2015-07-01 DIAGNOSIS — R451 Restlessness and agitation: Secondary | ICD-10-CM | POA: Diagnosis present

## 2015-07-01 DIAGNOSIS — Z90710 Acquired absence of both cervix and uterus: Secondary | ICD-10-CM | POA: Diagnosis not present

## 2015-07-01 DIAGNOSIS — E785 Hyperlipidemia, unspecified: Secondary | ICD-10-CM | POA: Diagnosis present

## 2015-07-01 DIAGNOSIS — E119 Type 2 diabetes mellitus without complications: Secondary | ICD-10-CM | POA: Diagnosis present

## 2015-07-01 DIAGNOSIS — F17210 Nicotine dependence, cigarettes, uncomplicated: Secondary | ICD-10-CM | POA: Diagnosis present

## 2015-07-01 DIAGNOSIS — Z8261 Family history of arthritis: Secondary | ICD-10-CM | POA: Diagnosis not present

## 2015-07-01 DIAGNOSIS — Z818 Family history of other mental and behavioral disorders: Secondary | ICD-10-CM

## 2015-07-01 DIAGNOSIS — Z915 Personal history of self-harm: Secondary | ICD-10-CM | POA: Diagnosis not present

## 2015-07-01 DIAGNOSIS — G4700 Insomnia, unspecified: Secondary | ICD-10-CM | POA: Diagnosis present

## 2015-07-01 DIAGNOSIS — K219 Gastro-esophageal reflux disease without esophagitis: Secondary | ICD-10-CM | POA: Diagnosis present

## 2015-07-01 DIAGNOSIS — K5900 Constipation, unspecified: Secondary | ICD-10-CM | POA: Diagnosis present

## 2015-07-01 DIAGNOSIS — F3112 Bipolar disorder, current episode manic without psychotic features, moderate: Secondary | ICD-10-CM | POA: Diagnosis not present

## 2015-07-01 HISTORY — DX: Hypothyroidism, unspecified: E03.9

## 2015-07-01 LAB — GLUCOSE, CAPILLARY: Glucose-Capillary: 136 mg/dL — ABNORMAL HIGH (ref 65–99)

## 2015-07-01 LAB — RAPID URINE DRUG SCREEN, HOSP PERFORMED
Amphetamines: NOT DETECTED
BENZODIAZEPINES: NOT DETECTED
Barbiturates: NOT DETECTED
COCAINE: NOT DETECTED
OPIATES: NOT DETECTED
Tetrahydrocannabinol: NOT DETECTED

## 2015-07-01 LAB — CBG MONITORING, ED: GLUCOSE-CAPILLARY: 102 mg/dL — AB (ref 65–99)

## 2015-07-01 MED ORDER — TRAZODONE HCL 100 MG PO TABS: 100.0000 mg | ORAL_TABLET | Freq: Every day | ORAL | Status: DC

## 2015-07-01 MED ORDER — ROSUVASTATIN CALCIUM 20 MG PO TABS
20.0000 mg | ORAL_TABLET | Freq: Every day | ORAL | Status: DC
  Administered 2015-07-02 – 2015-07-07 (×6): 20 mg via ORAL
  Filled 2015-07-01 (×8): qty 1

## 2015-07-01 MED ORDER — TRAZODONE HCL 100 MG PO TABS
100.0000 mg | ORAL_TABLET | Freq: Every evening | ORAL | Status: DC | PRN
  Filled 2015-07-01: qty 1

## 2015-07-01 MED ORDER — ALUM & MAG HYDROXIDE-SIMETH 200-200-20 MG/5ML PO SUSP: 30.0000 mL | ORAL | Status: DC | PRN

## 2015-07-01 MED ORDER — NICOTINE 21 MG/24HR TD PT24
21.0000 mg | MEDICATED_PATCH | Freq: Every day | TRANSDERMAL | Status: DC | PRN
  Administered 2015-07-04 – 2015-07-08 (×3): 21 mg via TRANSDERMAL
  Filled 2015-07-01 (×4): qty 1

## 2015-07-01 MED ORDER — FENOFIBRATE 160 MG PO TABS
160.0000 mg | ORAL_TABLET | Freq: Every day | ORAL | Status: DC
  Administered 2015-07-02 – 2015-07-08 (×7): 160 mg via ORAL
  Filled 2015-07-01 (×9): qty 1

## 2015-07-01 MED ORDER — LINAGLIPTIN 5 MG PO TABS
5.0000 mg | ORAL_TABLET | Freq: Every day | ORAL | Status: DC
  Administered 2015-07-02 – 2015-07-08 (×7): 5 mg via ORAL
  Filled 2015-07-01 (×9): qty 1

## 2015-07-01 MED ORDER — ARIPIPRAZOLE 10 MG PO TABS
10.0000 mg | ORAL_TABLET | Freq: Every day | ORAL | Status: DC
  Administered 2015-07-02 – 2015-07-03 (×2): 10 mg via ORAL
  Filled 2015-07-01 (×2): qty 1

## 2015-07-01 MED ORDER — ACETAMINOPHEN 325 MG PO TABS: 650.0000 mg | ORAL_TABLET | Freq: Four times a day (QID) | ORAL | Status: DC | PRN

## 2015-07-01 MED ORDER — ASPIRIN 81 MG PO CHEW
81.0000 mg | CHEWABLE_TABLET | Freq: Every day | ORAL | Status: DC
  Administered 2015-07-02 – 2015-07-08 (×7): 81 mg via ORAL
  Filled 2015-07-01 (×7): qty 1

## 2015-07-01 MED ORDER — INSULIN ASPART 100 UNIT/ML ~~LOC~~ SOLN: 0.0000 [IU] | Freq: Three times a day (TID) | SUBCUTANEOUS | Status: DC

## 2015-07-01 MED ORDER — INSULIN ASPART 100 UNIT/ML ~~LOC~~ SOLN: 0.0000 [IU] | Freq: Every day | SUBCUTANEOUS | Status: DC

## 2015-07-01 MED ORDER — GLIPIZIDE 5 MG PO TABS
10.0000 mg | ORAL_TABLET | Freq: Two times a day (BID) | ORAL | Status: DC
  Administered 2015-07-02 – 2015-07-08 (×13): 10 mg via ORAL
  Filled 2015-07-01 (×13): qty 2

## 2015-07-01 MED ORDER — MAGNESIUM HYDROXIDE 400 MG/5ML PO SUSP: 30.0000 mL | Freq: Every day | ORAL | Status: DC | PRN

## 2015-07-01 MED ORDER — LORAZEPAM 1 MG PO TABS
1.0000 mg | ORAL_TABLET | Freq: Three times a day (TID) | ORAL | Status: DC | PRN
  Administered 2015-07-01 – 2015-07-04 (×2): 1 mg via ORAL
  Filled 2015-07-01 (×2): qty 1

## 2015-07-01 MED ORDER — DULOXETINE HCL 20 MG PO CPEP
60.0000 mg | ORAL_CAPSULE | Freq: Every day | ORAL | Status: DC
  Administered 2015-07-02: 60 mg via ORAL
  Filled 2015-07-01: qty 3

## 2015-07-01 MED ORDER — LEVOTHYROXINE SODIUM 25 MCG PO TABS
50.0000 ug | ORAL_TABLET | Freq: Every day | ORAL | Status: DC
  Administered 2015-07-02 – 2015-07-04 (×3): 50 ug via ORAL
  Filled 2015-07-01 (×4): qty 2

## 2015-07-01 NOTE — [facility] Group Notes (Signed)
[facility] Group Notes:  (Nursing/MHT/Case Management/Adjunct)  Date:  07/01/2015  Time:  10:16 PM  Type of Therapy:  Group Therapy  Participation Level:  Did Not Attend    Julie Morrow Joy Chasen Mendell 07/01/2015, 10:16 PM

## 2015-07-01 NOTE — ED Notes (Signed)
Bed: [facility]43 Expected date:  Expected time:  Means of arrival:  Comments: Triage 4 

## 2015-07-01 NOTE — BH Assessment (Signed)
BHH Assessment Progress Note  Per Thedore Mins, MD, this pt requires psychiatric hospitalization at this time.  Pt presents under IVC initiated by her son, Bo Rogue, and upheld by Dr Jannifer Franklin.  Evert Kohl calls from [facility] to report that pt has been accepted to their facility by Dr Ardyth Harps to Rm 312.  Please call report to [phone].  Dahlia Byes, NP, concurs with this decision.  At [facility] request, IVC documents have been faxed to [phone].  Pt's nurse, Dawnaly, has been notified.  Pt is to be transported via [facility].  Doylene Canning, MA Triage Specialist [phone]

## 2015-07-01 NOTE — ED Notes (Signed)
Patient has three bags of belongings in locker 26. 

## 2015-07-01 NOTE — Consult Note (Signed)
[place] Psychiatry Consult   Reason for Consult:  Acute mania Referring Physician: Elvina Sidle EDP Patient Identification: Julie Morrow MRN:  [id number] Principal Diagnosis: Bipolar 1 disorder, manic, moderate ([place]) Diagnosis:   Patient Active Problem List   Diagnosis Date Noted  . Bipolar 1 disorder, manic, moderate ([place]) [F31.12] 07/01/2015  . H/O: obesity [Z87.898] 04/08/2015  . Depression, major, recurrent, moderate ([place]) [F33.1] 04/08/2015  . Insomnia, persistent [G47.00] 04/08/2015  . H/O elevated lipids [Z86.39] 04/08/2015  . H/O: hypothyroidism [Z86.39] 04/08/2015  . Anxiety, generalized [F41.1] 04/08/2015  . H/O gastrointestinal disease [Z87.19] 04/08/2015  . H/O diabetes mellitus [Z86.39] 04/08/2015  . Depression, major, recurrent, in remission ([place]) [F33.40] 04/08/2015  . Clinical depression [F32.9] 11/07/2014  . Acid reflux [K21.9] 11/07/2014  . HLD (hyperlipidemia) [E78.5] 11/07/2014  . Psoriasis [L40.9] 11/07/2014  . Disease of thyroid gland [E07.9] 11/07/2014    Total Time spent with patient: 20 minutes  Subjective:   Julie Morrow is a 65 y.o. female patient admitted under IVC initiated by her son due to his report that patient is off her medications and has demonstrated manic behaviors.   HPI:   Julie Morrow is a 65 year old female with a history of Bipolar Disorder placed under IVC by her son due to poor sleep and manic behaviors. The patient has minimized the symptoms reported by her son. She was identified by her son to be not sleeping for days and driving dangerously. Her son reports that the patient has been off medications for a long period of time. Patient is denying any suicidal or homicidal thoughts. During psychiatric assessment with Attending Psychiatrist this morning the patient was noted to have pressured speech and signs of acute mania. The patient has been restarted on her Abilify 10 mg daily for mood stabilization.   Past Psychiatric  History: Bipolar 1 Disorder   Risk to Self: Suicidal Ideation: No Suicidal Intent: No Is patient at risk for suicide?: No Suicidal Plan?: No Access to Means: No What has been your use of drugs/alcohol within the last 12 months?: Denies How many times?: 1 (1986) Other Self Harm Risks: Denies Triggers for Past Attempts: Other (Comment) (conflict at work) Production assistant, radio Injurious Behavior: None Risk to Others: Homicidal Ideation: No Thoughts of Harm to Others: No Current Homicidal Intent: No Current Homicidal Plan: No Access to Homicidal Means: No Identified Victim: Denies History of harm to others?: No Assessment of Violence: None Noted Violent Behavior Description: Denies Does patient have access to weapons?: No Criminal Charges Pending?: No Does patient have a court date: No Prior Inpatient Therapy: Prior Inpatient Therapy: Yes Prior Therapy Dates: 2015 Prior Therapy Facilty/Provider(s): [facility] Reason for Treatment: Depression Prior Outpatient Therapy: Prior Outpatient Therapy: Yes Prior Therapy Dates: 2015 Prior Therapy Facilty/Provider(s): [facility] Reason for Treatment: Depression Does patient have an ACCT team?: No Does patient have Intensive In-House Services?  : No Does patient have [facility] services? : No Does patient have P4CC services?: No  Past Medical History:  Past Medical History  Diagnosis Date  . Thyroid disease   . Depression   . Anxiety   . Diabetes mellitus, type II ([place])     Patient takes Glucotrol and Januvia    Past Surgical History  Procedure Laterality Date  . Abdominal hysterectomy     Family History:  Family History  Problem Relation Age of Onset  . Dementia Mother   . Arthritis Father    Social History:  History  Alcohol Use No  History  Drug Use No    Social History   Social History  . Marital Status: Single    Spouse Name: N/A  . Number of Children: N/A  . Years of Education: N/A   Social History Main Topics  . Smoking  status: Current Every Day Smoker -- 0.25 packs/day    Types: Cigarettes    Start date: 11/06/1984  . Smokeless tobacco: Never Used  . Alcohol Use: No  . Drug Use: No  . Sexual Activity: No   Other Topics Concern  . None   Social History Narrative   Additional Social History:    Allergies:   Allergies  Allergen Reactions  . Neomycin-Bacitracin Zn-Polymyx Rash    Labs:  Results for orders placed or performed during the hospital encounter of 06/30/15 (from the past 48 hour(s))  Comprehensive metabolic panel     Status: Abnormal   Collection Time: 06/30/15  9:00 PM  Result Value Ref Range   Sodium 139 135 - 145 mmol/L   Potassium 4.5 3.5 - 5.1 mmol/L   Chloride 100 (L) 101 - 111 mmol/L   CO2 26 22 - 32 mmol/L   Glucose, Bld 144 (H) 65 - 99 mg/dL   BUN 15 6 - 20 mg/dL   Creatinine, Ser 1.24 (H) 0.44 - 1.00 mg/dL   Calcium 9.5 8.9 - 10.3 mg/dL   Total Protein 7.4 6.5 - 8.1 g/dL   Albumin 4.2 3.5 - 5.0 g/dL   AST 34 15 - 41 U/L   ALT 21 14 - 54 U/L   Alkaline Phosphatase 72 38 - 126 U/L   Total Bilirubin 0.8 0.3 - 1.2 mg/dL   GFR calc non Af Amer 45 (L) >60 mL/min   GFR calc Af Amer 52 (L) >60 mL/min    Comment: (NOTE) The eGFR has been calculated using the CKD EPI equation. This calculation has not been validated in all clinical situations. eGFR's persistently <60 mL/min signify possible Chronic Kidney Disease.    Anion gap 13 5 - 15  Ethanol (ETOH)     Status: None   Collection Time: 06/30/15  9:00 PM  Result Value Ref Range   Alcohol, Ethyl (B) <5 <5 mg/dL    Comment:        LOWEST DETECTABLE LIMIT FOR SERUM ALCOHOL IS 5 mg/dL FOR MEDICAL PURPOSES ONLY   CBC     Status: Abnormal   Collection Time: 06/30/15  9:00 PM  Result Value Ref Range   WBC 15.6 (H) 4.0 - 10.5 K/uL   RBC 4.91 3.87 - 5.11 MIL/uL   Hemoglobin 14.7 12.0 - 15.0 g/dL   HCT 47.1 (H) 36.0 - 46.0 %   MCV 95.9 78.0 - 100.0 fL   MCH 29.9 26.0 - 34.0 pg   MCHC 31.2 30.0 - 36.0 g/dL   RDW 16.2  (H) 11.5 - 15.5 %   Platelets 255 150 - 400 K/uL  Urine rapid drug screen (hosp performed) (Not at [facility])     Status: None   Collection Time: 07/01/15  8:22 AM  Result Value Ref Range   Opiates NONE DETECTED NONE DETECTED   Cocaine NONE DETECTED NONE DETECTED   Benzodiazepines NONE DETECTED NONE DETECTED   Amphetamines NONE DETECTED NONE DETECTED   Tetrahydrocannabinol NONE DETECTED NONE DETECTED   Barbiturates NONE DETECTED NONE DETECTED    Comment:        DRUG SCREEN FOR MEDICAL PURPOSES ONLY.  IF CONFIRMATION IS NEEDED FOR ANY PURPOSE, NOTIFY LAB WITHIN  5 DAYS.        LOWEST DETECTABLE LIMITS FOR URINE DRUG SCREEN Drug Class       Cutoff (ng/mL) Amphetamine      1000 Barbiturate      200 Benzodiazepine   200 Tricyclics       300 Opiates          300 Cocaine          300 THC              50     Current Facility-Administered Medications  Medication Dose Route Frequency Provider Last Rate Last Dose  . acetaminophen (TYLENOL) tablet 650 mg  650 mg Oral Q4H PRN Lorre Nick, MD      . albuterol (PROVENTIL HFA;VENTOLIN HFA) 108 (90 Base) MCG/ACT inhaler 1-2 puff  1-2 puff Inhalation Q4H PRN Lorre Nick, MD      . ARIPiprazole (ABILIFY) tablet 10 mg  10 mg Oral Daily Lorre Nick, MD   10 mg at 07/01/15 1237  . benztropine (COGENTIN) tablet 1 mg  1 mg Oral Daily Lorre Nick, MD   1 mg at 07/01/15 1236  . DULoxetine (CYMBALTA) DR capsule 60 mg  60 mg Oral Daily Lorre Nick, MD   60 mg at 07/01/15 1238  . glipiZIDE (GLUCOTROL) tablet 10 mg  10 mg Oral QAC breakfast Lorre Nick, MD   10 mg at 07/01/15 1237  . ibuprofen (ADVIL,MOTRIN) tablet 600 mg  600 mg Oral Q8H PRN Lorre Nick, MD      . insulin aspart (novoLOG) injection 0-15 Units  0-15 Units Subcutaneous TID WC Thermon Leyland, NP      . insulin aspart (novoLOG) injection 0-5 Units  0-5 Units Subcutaneous QHS Thermon Leyland, NP      . levothyroxine (SYNTHROID, LEVOTHROID) tablet 250 mcg  250 mcg Oral QAC breakfast  Lorre Nick, MD   250 mcg at 07/01/15 1237  . linagliptin (TRADJENTA) tablet 5 mg  5 mg Oral Daily Lorre Nick, MD   5 mg at 07/01/15 1238  . LORazepam (ATIVAN) tablet 1 mg  1 mg Oral Q8H PRN Lorre Nick, MD      . nicotine (NICODERM CQ - dosed in mg/24 hours) patch 21 mg  21 mg Transdermal Daily Lorre Nick, MD   21 mg at 07/01/15 1235  . ondansetron (ZOFRAN) tablet 4 mg  4 mg Oral Q8H PRN Lorre Nick, MD      . traZODone (DESYREL) tablet 100 mg  100 mg Oral QHS Thedore Mins, MD       Current Outpatient Prescriptions  Medication Sig Dispense Refill  . ARIPiprazole (ABILIFY) 10 MG tablet Take 1 tablet (10 mg total) by mouth daily. 30 tablet 1  . aspirin 81 MG chewable tablet Chew 81 mg by mouth daily.    . benztropine (COGENTIN) 1 MG tablet Take 1 tablet (1 mg total) by mouth daily. 30 tablet 1  . buPROPion (WELLBUTRIN SR) 150 MG 12 hr tablet Take 150 mg by mouth 2 (two) times daily.    . clonazePAM (KLONOPIN) 0.5 MG tablet Take 1 tablet (0.5 mg total) by mouth at bedtime. 30 tablet 2  . DULoxetine (CYMBALTA) 60 MG capsule Take 1 capsule (60 mg total) by mouth daily. 90 capsule 1  . fenofibrate 160 MG tablet Take 160 mg by mouth daily.    Marland Kitchen glipiZIDE (GLUCOTROL) 10 MG tablet Take 10 mg by mouth 2 (two) times daily before a meal.     .  levothyroxine (SYNTHROID, LEVOTHROID) 50 MCG tablet Take 50 mcg by mouth daily before breakfast.    . rosuvastatin (CRESTOR) 20 MG tablet Take 20 mg by mouth at bedtime.    . saccharomyces boulardii (FLORASTOR) 250 MG capsule Take 250 mg by mouth daily.    . sitaGLIPtin (JANUVIA) 100 MG tablet Take 100 mg by mouth daily.    . fluconazole (DIFLUCAN) 150 MG tablet Take 1 tab on 02/17/15 and take 1 tab on 02/20/15 2 tablet 0    Musculoskeletal: Strength & Muscle Tone: within normal limits Gait & Station: normal Patient leans: N/A  Psychiatric Specialty Exam: Review of Systems  Psychiatric/Behavioral: The patient is nervous/anxious and has  insomnia.     Blood pressure 134/85, pulse 62, temperature 97.4 F (36.3 C), temperature source Oral, resp. rate 18, SpO2 96 %.There is no weight on file to calculate BMI.  General Appearance: Casual  Eye Contact::  Fair  Speech:  Pressured  Volume:  Varies when discussing events   Mood:  Irritable  Affect:  Depressed  Thought Process:  Circumstantial  Orientation:  Full (Time, Place, and Person)  Thought Content:  Rumination  Suicidal Thoughts:  No  Homicidal Thoughts:  No  Memory:  Immediate;   Good Recent;   Good Remote;   Good  Judgement:  Impaired  Insight:  Lacking  Psychomotor Activity:  Increased  Concentration:  Fair  Recall:  [place] of Knowledge:Good  Language: Good  Akathisia:  No  Handed:  Right  AIMS (if indicated):     Assets:  Communication Skills Desire for Improvement Financial Resources/Insurance Housing Leisure Time Physical Health Resilience Social Support  ADL's:  Intact  Cognition: WNL  Sleep:      Treatment Plan Summary: Daily contact with patient to assess and evaluate symptoms and progress in treatment and Medication management  Continue current psychiatric medications as ordered Start Novolog SSI moderate due to history of diabetes for improved control with blood sugar checks achs.   Disposition: Recommend psychiatric Inpatient admission when medically cleared. Supportive therapy provided about ongoing stressors.  Elmarie Shiley, NP 07/01/2015 2:40 PM Patient seen face-to-face for psychiatric evaluation, chart reviewed and case discussed with the physician extender and developed treatment plan. Reviewed the information documented and agree with the treatment plan. Corena Pilgrim, MD

## 2015-07-01 NOTE — Progress Notes (Signed)
Patient has been accepted to [facility]  Patient assigned to room 312 Accepting physician is Dr. Ardyth Harps.  Call report to [phone]  Representative was Tom.    07/01/2015 Cheryl Flash, MS, NCC, LPCA Therapeutic Triage Specialist

## 2015-07-01 NOTE — ED Notes (Signed)
Patient transferred to [facility].  Left the unit ambulatory with [facility] ([place]).  Patient continues to state that there is nothing wrong with her and she does not need to be in a hospital.  Explained to her that because of the IVC we hat to send her.  Patient is familiar with the hospital there and did not offer any resistance when the [facility] took her off the unit.

## 2015-07-02 LAB — GLUCOSE, CAPILLARY
GLUCOSE-CAPILLARY: 145 mg/dL — AB (ref 65–99)
Glucose-Capillary: 101 mg/dL — ABNORMAL HIGH (ref 65–99)

## 2015-07-02 MED ORDER — DOCUSATE SODIUM 100 MG PO CAPS
200.0000 mg | ORAL_CAPSULE | Freq: Two times a day (BID) | ORAL | Status: DC
  Administered 2015-07-02 – 2015-07-08 (×13): 200 mg via ORAL
  Filled 2015-07-02 (×16): qty 2

## 2015-07-02 NOTE — B[facility] Group Notes (Signed)
[facility] LCSW Aftercare Discharge Planning Group Note   07/02/2015 2:52 PM  Participation Quality:  Patient was called to group but did not attend.  Lulu Riding, MSW, LCSWA
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
## 2015-07-02 NOTE — Progress Notes (Signed)
Patient ID: Julie Morrow, female   DOB: [birth year], 65 y.o.   MRN: [id number]  Admission Note:  D:64 yr female who presents IVC in no acute distress for the treatment of non-compliance and manic Sx. Pt appears flat and depressed. Pt was calm and cooperative with admission process. Pt denies SI/HI/AVH at this time. Pt was ordered insulin, but pt stated she does not take insulin at home and she is not going to take it here unless she really needs it. "I'm happier than I've ever been, Jesus in my heart to stay". Pt stated she did not know why she was here , since there was no real reason for her being here.   A:Skin was assessed by female nurse. PT searched and no contraband found, POC and unit policies explained and understanding verbalized. Consents obtained. Food and fluids offered, and fluids accepted.   R:Pt had no additional questions or concerns.
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
## 2015-07-02 NOTE — Progress Notes (Signed)
Recreation Therapy Notes  INPATIENT RECREATION THERAPY ASSESSMENT  Patient Details Name: Julie Morrow MRN: [id number] DOB: 10/07/1950 Today's Date: 07/02/2015  Patient Stressors:  Patient reported no stressors.  Coping Skills:   Exercise, Art/Dance, Talking, Music, Sports, Other (Comment) (Give it to God)  Personal Challenges:  Patient reported no personal challenges.  Leisure Interests (2+):  Individual - Other (Comment) Julie Morrow, play with grandkids)  Awareness of Community Resources:  No  Community Resources:     Current Use:    If no, Barriers?:    Patient Strengths:  Paramedic, Chief Executive Officer  Patient Identified Areas of Improvement:  "No, just find a rich man"  Current Recreation Participation:  "Everything I do is fun"  Patient Goal for Hospitalization:  To get out  [place] of Residence:  [place] of Residence:  [place]   Current SI (including self-harm):  No  Current HI:  No  Consent to Intern Participation: N/A  Due to patient reporting no personal challenges, LRT will not develop a Recreational Therapy Care Plan at this time. If patient's status changes, LRT will develop a Recreational Therapy Care Plan.  Jacquelynn Cree, LRT/CTRS 07/02/2015, 1:56 PM

## 2015-07-02 NOTE — Progress Notes (Signed)
Recreation Therapy Notes  Date: 02.15.17 Time: 3:00 pm Location: Community Room  Group Topic: Self-esteem  Goal Area(s) Addresses:  Patient will write down at least one positive trait. Patient will verbalize benefit of having a healthy self-esteem.  Behavioral Response: Did not attend  Intervention: I Am  Activity: Patients were given a worksheet with the letter I and instructed to write as many positive traits about themselves inside the letter.  Education: LRT educated patients on ways they can increase self-esteem.  Education Outcome: Patient did not attend group.   Clinical Observations/Feedback: Patient did not attend group.  Jacquelynn Cree, LRT/CTRS 07/02/2015 4:12 PM

## 2015-07-02 NOTE — Progress Notes (Signed)
Patient with appropriate affect, cooperative behavior with meals, meds and plan of care. No SI/HI at this time. Pleasant and verbalizes needs appropriately with staff. Appropriate with peers. No SI/HI at this time. Safety maintained. Refused insulin stating " I do not take insulin at home and I am not taking it here". Allows blood glucose monitoring. Safety maintained.

## 2015-07-02 NOTE — [facility] Group Notes (Signed)
[facility] LCSW Group Therapy  07/02/2015 4:25 PM  Patient was called to group and did not attend.  Jenel Lucks, CSW Intern 07/02/2015, 4:25 PM  Beryl Meager, MSW, LCSWA 07/03/2015, 10:24AM

## 2015-07-02 NOTE — [facility] Suicide Risk Assessment (Signed)
[facility] Admission Suicide Risk Assessment   Nursing information obtained from:    Demographic factors:    Current Mental Status:    Loss Factors:    Historical Factors:    Risk Reduction Factors:     Total Time spent with patient: 1 hour Principal Problem: Bipolar 1 disorder (HCC) Diagnosis:   Patient Active Problem List   Diagnosis Date Noted  . Bipolar 1 disorder, manic, moderate (HCC) [F31.12] 07/01/2015  . Bipolar 1 disorder (HCC) [F31.9] 07/01/2015  . H/O: obesity [Z87.898] 04/08/2015  . Depression, major, recurrent, moderate (HCC) [F33.1] 04/08/2015  . Insomnia, persistent [G47.00] 04/08/2015  . H/O elevated lipids [Z86.39] 04/08/2015  . H/O: hypothyroidism [Z86.39] 04/08/2015  . Anxiety, generalized [F41.1] 04/08/2015  . H/O gastrointestinal disease [Z87.19] 04/08/2015  . H/O diabetes mellitus [Z86.39] 04/08/2015  . Depression, major, recurrent, in remission (HCC) [F33.40] 04/08/2015  . Clinical depression [F32.9] 11/07/2014  . Acid reflux [K21.9] 11/07/2014  . HLD (hyperlipidemia) [E78.5] 11/07/2014  . Psoriasis [L40.9] 11/07/2014  . Disease of thyroid gland [E07.9] 11/07/2014   Subjective Data:   Continued Clinical Symptoms:  Alcohol Use Disorder Identification Test Final Score (AUDIT): 0 The "Alcohol Use Disorders Identification Test", Guidelines for Use in Primary Care, Second Edition.  World Science writer [facility]). Score between 0-7:  no or low risk or alcohol related problems. Score between 8-15:  moderate risk of alcohol related problems. Score between 16-19:  high risk of alcohol related problems. Score 20 or above:  warrants further diagnostic evaluation for alcohol dependence and treatment.   CLINICAL FACTORS:   Previous Psychiatric Diagnoses and Treatments    Psychiatric Specialty Exam: ROS                                                          COGNITIVE FEATURES THAT CONTRIBUTE TO RISK:  Closed-mindedness    SUICIDE  RISK:   Moderate:  Frequent suicidal ideation with limited intensity, and duration, some specificity in terms of plans, no associated intent, good self-control, limited dysphoria/symptomatology, some risk factors present, and identifiable protective factors, including available and accessible social support.  PLAN OF CARE: admit to [facility]  I certify that inpatient services furnished can reasonably be expected to improve the patient's condition.   Jimmy Footman, MD 07/02/2015, 4:31 PM

## 2015-07-02 NOTE — Tx Team (Signed)
Initial Interdisciplinary Treatment Plan   PATIENT STRESSORS: Financial difficulties Health problems Medication change or noncompliance   PATIENT STRENGTHS: Active sense of humor Communication skills General fund of knowledge Motivation for treatment/growth Supportive family/friends   PROBLEM LIST: Problem List/Patient Goals Date to be addressed Date deferred Reason deferred Estimated date of resolution  Medication non-compliance 07/02/15     mania 07/02/15     Anxiety 07/02/15     "nothing, adjust meds" 07/02/15                                    DISCHARGE CRITERIA:  Ability to meet basic life and health needs Adequate post-discharge living arrangements Verbal commitment to aftercare and medication compliance  PRELIMINARY DISCHARGE PLAN: Attend aftercare/continuing care group Outpatient therapy  PATIENT/FAMIILY INVOLVEMENT: This treatment plan has been presented to and reviewed with the patient, Julie Morrow.  The patient and family have been given the opportunity to ask questions and make suggestions.  Jacques Navy A 07/02/2015, 12:13 AM

## 2015-07-02 NOTE — [facility] Suicide Risk Assessment (Signed)
[facility] INPATIENT:  Family/Significant Other Suicide Prevention Education  Suicide Prevention Education:  Patient Refusal for Family/Significant Other Suicide Prevention Education: The patient WINONA SISON has refused to provide written consent for family/significant other to be provided Family/Significant Other Suicide Prevention Education during admission and/or prior to discharge.  Physician notified.  Pt refused SPE from the CSW.  Dorothe Pea Chauntay Paszkiewicz 07/02/2015, 1:52 PM

## 2015-07-02 NOTE — [facility] Group Notes (Signed)
[facility] Group Notes:  (Nursing/MHT/Case Management/Adjunct)  Date:  07/02/2015  Time:  1:43 PM  Type of Therapy:  Group Therapy  Participation Level:  Did Not Attend  Participation Quality:Summary of Progress/Problems:  Mayra Neer 07/02/2015, 1:43 PM

## 2015-07-03 LAB — TSH: TSH: >90 u[IU]/mL — ABNORMAL HIGH (ref 0.350–4.500)

## 2015-07-03 LAB — GLUCOSE, CAPILLARY
Glucose-Capillary: 125 mg/dL — ABNORMAL HIGH (ref 65–99)
Glucose-Capillary: 116 mg/dL — ABNORMAL HIGH (ref 65–99)
Glucose-Capillary: 100 mg/dL — ABNORMAL HIGH (ref 65–99)

## 2015-07-03 LAB — HEMOGLOBIN A1C: Hgb A1c MFr Bld: 5.8 % (ref 4.0–6.0)

## 2015-07-03 LAB — LIPID PANEL
CHOL/HDL RATIO: 8 ratio
CHOLESTEROL: 175 mg/dL (ref 0–200)
HDL: 22 mg/dL — AB (ref >40)
LDL Cholesterol: 93 mg/dL (ref 0–99)
TRIGLYCERIDES: 298 mg/dL — AB (ref <150)
VLDL: 60 mg/dL — AB (ref 0–40)

## 2015-07-03 MED ORDER — ARIPIPRAZOLE 10 MG PO TABS
20.0000 mg | ORAL_TABLET | Freq: Every day | ORAL | Status: DC
Start: 2015-07-04 — End: 2015-07-04
  Administered 2015-07-04: 20 mg via ORAL
  Filled 2015-07-03: qty 2

## 2015-07-03 MED ORDER — NYSTATIN 100000 UNIT/GM EX POWD
Freq: Two times a day (BID) | CUTANEOUS | Status: DC
  Administered 2015-07-03 – 2015-07-07 (×9): via TOPICAL
  Filled 2015-07-03 (×2): qty 15

## 2015-07-03 NOTE — [facility] Group Notes (Signed)
[facility] Group Notes:  (Nursing/MHT/Case Management/Adjunct)  Date:  07/03/2015  Time:  2:37 PM  Type of Therapy:  Group Therapy  Participation Level:  Active  Participation Quality:  Appropriate and Attentive  Affect:  Appropriate  Cognitive:  Alert, Appropriate and Oriented  Insight:  Appropriate  Engagement in Group:  Engaged  Modes of Intervention:  Activity  Summary of Progress/Problems:  Julie Morrow 07/03/2015, 2:37 PM

## 2015-07-03 NOTE — Progress Notes (Signed)
Recreation Therapy Notes  Date: 02.16.17 Time: 3:00 pm Location: Community Room  Group Topic: Coping Skills/Leisure Education  Goal Area(s) Addresses:  Patient will identify things they are grateful for. Patient will identify how being grateful can influence decision making.  Behavioral Response: Did not attend  Intervention: Grateful Wheel  Activity: Patients were given an "I Am Grateful For" worksheet and instructed to write 2-3 things they were grateful for under each category.  Education: LRT educated patients on why it is important to think of things they are grateful for.  Education Outcome: Patient did not attend group.   Clinical Observations/Feedback: Patient did not attend group.  Jacquelynn Cree, LRT/CTRS 07/03/2015 2:50 PM
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
## 2015-07-03 NOTE — Progress Notes (Signed)
D: Pt calm and appropriate with staff and peers. Denis pain. Denies SI/AVH. Compliant with medications and unit routine. Voices no additional concerns at this time. A: Encouragement and support provided. Medications given as prescribed. Q15 minute checks maintained for safety. R: Pt remains safe on unit. Appropriate interactions with staff and peers. Will continue to monitor.

## 2015-07-03 NOTE — Plan of Care (Signed)
Problem: Ineffective individual coping Goal: LTG: Patient will report a decrease in negative feelings Outcome: Progressing Working on coping skills     

## 2015-07-03 NOTE — Plan of Care (Signed)
Problem: Ineffective individual coping Goal: STG: Patient will remain free from self harm Outcome: Progressing Pt remains free from harm  Problem: Alteration in mood Goal: STG-Increased ability to follow directions Outcome: Progressing Pt follows directions given by staff

## 2015-07-03 NOTE — Progress Notes (Signed)
D: Patient voice of goal today to be positive. Patient stated slept good last night .Stated appetite is good and energy level  high. Stated concentration is good . Stated on Depression scale 0 , hopeless 0 and anxiety 0 .( low 0-10 high) Denies suicidal  homicidal ideations .  No auditory hallucinations  No pain concerns . Appropriate ADL'S. Interacting with peers and staff. Affect cheerful on approach A: Encourage patient participation with unit programming . Instruction  Given on  Medication , verbalize understanding. R: Voice no other concerns. Staff continue to monitor

## 2015-07-03 NOTE — Progress Notes (Signed)
Patient ID: Julie Morrow, female   DOB: 1950/07/10, 65 y.o.   MRN: [id number] CSW contacted the pt's sister who said that the pt's father has stated that the pt has been experiencing extreme sleeplessness since November and possibly earlier in the fall of 2016/  Sister stated the pt has suffered from depression for years.  Pt's sister has been seen by her psychiatrist [name].  Pt's sister reported that the pt had been living with both of their other sister, but the pt was evicted by the third sister.  Pt's sister has been medically noncompliant.  Pt's sister stated the pt's other sister is [name] [phone].

## 2015-07-03 NOTE — Progress Notes (Signed)
[facility] MD Progress Note  07/03/2015 12:32 PM Julie Morrow  MRN:  [id number] Subjective:  Patient continues to believe that there is no need for her to be in the hospital.  "I am doing great". The patient's mood is euphoric, her concentration is poor, her thought process is tangential, speech is pushed.  Patient denies having any issues or concerns. She wants to know when she is going to be discharged. She denies problems with sleep, appetite, energy or concentration. She denies suicidality, homicidality or having auditory or visual hallucinations. She denies side effects from medications. She denies having any physical complaints. She was a stay yesterday on Abilify.   Per nursing: D: Pt calm and appropriate with staff and peers. Denis pain. Denies SI/AVH. Compliant with medications and unit routine. Voices no additional concerns at this time. A: Encouragement and support provided. Medications given as prescribed. Q15 minute checks maintained for safety. R: Pt remains safe on unit. Appropriate interactions with staff and peers. Will continue to monitor.  Principal Problem: Bipolar 1 disorder (HCC) Diagnosis:   Patient Active Problem List   Diagnosis Date Noted  . Bipolar 1 disorder, manic, moderate (HCC) [F31.12] 07/01/2015  . Bipolar 1 disorder (HCC) [F31.9] 07/01/2015  . H/O: obesity [Z87.898] 04/08/2015  . Depression, major, recurrent, moderate (HCC) [F33.1] 04/08/2015  . Insomnia, persistent [G47.00] 04/08/2015  . H/O elevated lipids [Z86.39] 04/08/2015  . H/O: hypothyroidism [Z86.39] 04/08/2015  . Anxiety, generalized [F41.1] 04/08/2015  . H/O gastrointestinal disease [Z87.19] 04/08/2015  . H/O diabetes mellitus [Z86.39] 04/08/2015  . Depression, major, recurrent, in remission (HCC) [F33.40] 04/08/2015  . Clinical depression [F32.9] 11/07/2014  . Acid reflux [K21.9] 11/07/2014  . HLD (hyperlipidemia) [E78.5] 11/07/2014  . Psoriasis [L40.9] 11/07/2014  . Disease of thyroid gland  [E07.9] 11/07/2014   Total Time spent with patient: 30 minutes    Past Medical History:  Past Medical History  Diagnosis Date  . Thyroid disease   . Depression   . Anxiety   . Diabetes mellitus, type II (HCC)     Patient takes Glucotrol and Januvia    Past Surgical History  Procedure Laterality Date  . Abdominal hysterectomy     Family History:  Family History  Problem Relation Age of Onset  . Dementia Mother   . Arthritis Father    Social History:  History  Alcohol Use No     History  Drug Use No    Social History   Social History  . Marital Status: Single    Spouse Name: N/A  . Number of Children: N/A  . Years of Education: N/A   Social History Main Topics  . Smoking status: Current Every Day Smoker -- 0.25 packs/day for 5 years    Types: Cigarettes    Start date: 11/06/1984  . Smokeless tobacco: Never Used  . Alcohol Use: No  . Drug Use: No  . Sexual Activity: No   Other Topics Concern  . None   Social History Narrative    Sleep: Good  Appetite:  Good  Current Medications: Current Facility-Administered Medications  Medication Dose Route Frequency Provider Last Rate Last Dose  . acetaminophen (TYLENOL) tablet 650 mg  650 mg Oral Q6H PRN Jimmy Footman, MD      . alum & mag hydroxide-simeth (MAALOX/MYLANTA) 200-200-20 MG/5ML suspension 30 mL  30 mL Oral Q4H PRN Jimmy Footman, MD      . Melene Muller ON 07/04/2015] ARIPiprazole (ABILIFY) tablet 20 mg  20 mg Oral Daily Jimmy Footman, MD      .  aspirin chewable tablet 81 mg  81 mg Oral Daily Jimmy Footman, MD   81 mg at 07/03/15 0949  . docusate sodium (COLACE) capsule 200 mg  200 mg Oral BID Jimmy Footman, MD   200 mg at 07/03/15 0949  . fenofibrate tablet 160 mg  160 mg Oral Daily Jimmy Footman, MD   160 mg at 07/03/15 0949  . glipiZIDE (GLUCOTROL) tablet 10 mg  10 mg Oral BID AC Jimmy Footman, MD   10 mg at 07/03/15 0835  .  levothyroxine (SYNTHROID, LEVOTHROID) tablet 50 mcg  50 mcg Oral QAC breakfast Jimmy Footman, MD   50 mcg at 07/03/15 0645  . linagliptin (TRADJENTA) tablet 5 mg  5 mg Oral Daily Jimmy Footman, MD   5 mg at 07/03/15 0949  . LORazepam (ATIVAN) tablet 1 mg  1 mg Oral TID PRN Jimmy Footman, MD   1 mg at 07/01/15 2155  . magnesium hydroxide (MILK OF MAGNESIA) suspension 30 mL  30 mL Oral Daily PRN Jimmy Footman, MD      . nicotine (NICODERM CQ - dosed in mg/24 hours) patch 21 mg  21 mg Transdermal Daily PRN Jimmy Footman, MD      . rosuvastatin (CRESTOR) tablet 20 mg  20 mg Oral QHS Jimmy Footman, MD   20 mg at 07/02/15 2225  . traZODone (DESYREL) tablet 100 mg  100 mg Oral QHS PRN Jimmy Footman, MD        Lab Results:  Results for orders placed or performed during the hospital encounter of 07/01/15 (from the past 48 hour(s))  Glucose, capillary     Status: Abnormal   Collection Time: 07/01/15  9:52 PM  Result Value Ref Range   Glucose-Capillary 136 (H) 65 - 99 mg/dL  Glucose, capillary     Status: Abnormal   Collection Time: 07/02/15 11:50 AM  Result Value Ref Range   Glucose-Capillary 145 (H) 65 - 99 mg/dL   Comment 1 Notify RN   Glucose, capillary     Status: Abnormal   Collection Time: 07/02/15  4:40 PM  Result Value Ref Range   Glucose-Capillary 101 (H) 65 - 99 mg/dL   Comment 1 Notify RN   Glucose, capillary     Status: Abnormal   Collection Time: 07/03/15  6:44 AM  Result Value Ref Range   Glucose-Capillary 125 (H) 65 - 99 mg/dL  TSH     Status: Abnormal   Collection Time: 07/03/15  7:38 AM  Result Value Ref Range   TSH >90.000 (H) 0.350 - 4.500 uIU/mL    Comment: NOTIFIED GWEN FARRISH ON 07/03/15 AT 1131 OF UPDATED VALUES. QSD CORRECTED ON 02/16 AT 1136: PREVIOUSLY REPORTED AS 149.000   Lipid panel     Status: Abnormal   Collection Time: 07/03/15  7:38 AM  Result Value Ref Range   Cholesterol  175 0 - 200 mg/dL   Triglycerides 161 (H) <150 mg/dL   HDL 22 (L) >09 mg/dL   Total CHOL/HDL Ratio 8.0 RATIO   VLDL 60 (H) 0 - 40 mg/dL   LDL Cholesterol 93 0 - 99 mg/dL    Comment:        Total Cholesterol/HDL:CHD Risk Coronary Heart Disease Risk Table                     Men   Women  1/2 Average Risk   3.4   3.3  Average Risk       5.0   4.4  2 X Average Risk   9.6   7.1  3 X Average Risk  23.4   11.0        Use the calculated Patient Ratio above and the CHD Risk Table to determine the patient's CHD Risk.        ATP III CLASSIFICATION (LDL):  <100     mg/dL   Optimal  161-096  mg/dL   Near or Above                    Optimal  130-159  mg/dL   Borderline  045-409  mg/dL   High  >811     mg/dL   Very High   Glucose, capillary     Status: Abnormal   Collection Time: 07/03/15 12:12 PM  Result Value Ref Range   Glucose-Capillary 116 (H) 65 - 99 mg/dL    Blood Alcohol level:  Lab Results  Component Value Date   ETH <5 06/30/2015    Physical Findings: AIMS:  , ,  ,  ,    CIWA:    COWS:     Musculoskeletal: Strength & Muscle Tone: within normal limits Gait & Station: normal Patient leans: N/A  Psychiatric Specialty Exam: Review of Systems  Constitutional: Negative.   HENT: Negative.   Eyes: Negative.   Respiratory: Negative.   Cardiovascular: Negative.   Gastrointestinal: Negative.   Genitourinary: Negative.   Musculoskeletal: Negative.   Skin: Negative.   Neurological: Negative.   Endo/Heme/Allergies: Negative.   Psychiatric/Behavioral: Negative.     Blood pressure 124/86, pulse 68, temperature 98 F (36.7 C), temperature source Oral, resp. rate 20, height 5\' 3"  (1.6 m), weight 121.564 kg (268 lb).Body mass index is 47.49 kg/(m^2).  General Appearance: Disheveled  Eye Contact::  Good  Speech:  Pressured  Volume:  Normal  Mood:  Euphoric  Affect:  Appropriate  Thought Process:  Tangential  Orientation:  Full (Time, Place, and Person)  Thought  Content:  Hallucinations: None  Suicidal Thoughts:  No  Homicidal Thoughts:  No  Memory:  Immediate;   Fair Recent;   Good Remote;   Good  Judgement:  Impaired  Insight:  Shallow  Psychomotor Activity:  Normal  Concentration:  Fair  Recall:  [employer] of Knowledge:Good  Language: Good  Akathisia:  No  Handed:    AIMS (if indicated):     Assets:  Psychologist, counselling Resources/Insurance  ADL's:  Intact  Cognition: WNL  Sleep:  Number of Hours: 7.15   Treatment Plan Summary: Daily contact with patient to assess and evaluate symptoms and progress in treatment and Medication management   Bipolar disorder type I: Patient has been started on Abilify on February 16. Today I will increase the dose from 10 mg to 20 mg daily  Insomnia: I will order Ativan 1 mg by mouth daily at bedtime. Also trazodone 100 mg by mouth daily at bedtime when necessary is ordered.  Patient reports his sleeping well and per nursing she is slept 7 hours  Agitation: Ativan 1 mg every 8 hours as needed disorder as a prn  For constipation patient will be started on Colace 200 mg by mouth twice a day  Dyslipidemia patient will be continued on fenofibrate 160 mg a day and Crestor 20 mg a day  Diabetes the patient will be continued on Glucotrol 10 mg by mouth daily and try gentamicin 5 mg a day.  Hypothyroidism the patient will be continued on Synthroid 50 g  daily.--- TSH greater than 90. I will consult endocrinology,  this is likely due to poor compliance  Tobacco use disorder: Continue nicotine patch at 21 mg a day  Diet carb modified  Precautions every 15 minute checks  Vital signs daily  Dispo: will d/c home once stable.  F/u will need to be set up with a psychiatrist.   Jimmy Footman, MD 07/03/2015, 12:32 PM

## 2015-07-04 ENCOUNTER — Encounter: Payer: Self-pay | Admitting: Internal Medicine

## 2015-07-04 DIAGNOSIS — F319 Bipolar disorder, unspecified: Principal | ICD-10-CM

## 2015-07-04 LAB — GLUCOSE, CAPILLARY
GLUCOSE-CAPILLARY: 146 mg/dL — AB (ref 65–99)
Glucose-Capillary: 138 mg/dL — ABNORMAL HIGH (ref 65–99)
Glucose-Capillary: 138 mg/dL — ABNORMAL HIGH (ref 65–99)

## 2015-07-04 LAB — PROLACTIN: Prolactin: 15.1 ng/mL (ref 4.8–23.3)

## 2015-07-04 MED ORDER — LEVOTHYROXINE SODIUM 100 MCG PO TABS
200.0000 ug | ORAL_TABLET | Freq: Every day | ORAL | Status: DC
  Administered 2015-07-05 – 2015-07-08 (×4): 200 ug via ORAL
  Filled 2015-07-04 (×4): qty 2

## 2015-07-04 MED ORDER — ARIPIPRAZOLE 15 MG PO TABS
30.0000 mg | ORAL_TABLET | Freq: Every day | ORAL | Status: DC
  Administered 2015-07-05 – 2015-07-08 (×4): 30 mg via ORAL
  Filled 2015-07-04 (×4): qty 2

## 2015-07-04 NOTE — Progress Notes (Signed)
[facility] MD Progress Note  07/04/2015 10:33 AM Julie Morrow  MRN:  [id number] Subjective:  Patient says she is doing excellent. She makes jokes constantly during assessment but is redirectable.  Patient is asking to be discharged on Monday because she wants to go to a store where there is a Engineer, civil (consulting).  The patient's mood is euphoric, her concentration is poor, her thought process is tangential, speech is pushed.  Patient denies having any issues or concerns. She wants to know when she is going to be discharged. She denies problems with sleep, appetite, energy or concentration. She denies suicidality, homicidality or having auditory or visual hallucinations. She denies side effects from medications. She denies having any physical complaints. Patient says she likes the Abilify and does not have any concerns about continuing this medication.  Per nursing: D: Pt seen in milieu interacting appropriately with peers and staff. Affect is bright. Pt states her goal for today was "to think positive things only." Denies SI/HI/AVH at this time. Denies pain. No concerns or complaints at this time. A: Emotional support and encouragement provided. Medications administered with education. q15 minute safety checks maintained. R: Pt remains free from harm.  Principal Problem: Bipolar 1 disorder (HCC) Diagnosis:   Patient Active Problem List   Diagnosis Date Noted  . Bipolar 1 disorder, manic, moderate (HCC) [F31.12] 07/01/2015  . Bipolar 1 disorder (HCC) [F31.9] 07/01/2015  . H/O: obesity [Z87.898] 04/08/2015  . Depression, major, recurrent, moderate (HCC) [F33.1] 04/08/2015  . Insomnia, persistent [G47.00] 04/08/2015  . H/O elevated lipids [Z86.39] 04/08/2015  . H/O: hypothyroidism [Z86.39] 04/08/2015  . Anxiety, generalized [F41.1] 04/08/2015  . H/O gastrointestinal disease [Z87.19] 04/08/2015  . H/O diabetes mellitus [Z86.39] 04/08/2015  . Depression, major, recurrent, in remission (HCC) [F33.40] 04/08/2015  .  Clinical depression [F32.9] 11/07/2014  . Acid reflux [K21.9] 11/07/2014  . HLD (hyperlipidemia) [E78.5] 11/07/2014  . Psoriasis [L40.9] 11/07/2014  . Disease of thyroid gland [E07.9] 11/07/2014   Total Time spent with patient: 30 minutes    Past Medical History:  Past Medical History  Diagnosis Date  . Thyroid disease   . Depression   . Anxiety   . Diabetes mellitus, type II (HCC)     Patient takes Glucotrol and Januvia    Past Surgical History  Procedure Laterality Date  . Abdominal hysterectomy     Family History:  Family History  Problem Relation Age of Onset  . Dementia Mother   . Arthritis Father    Social History:  History  Alcohol Use No     History  Drug Use No    Social History   Social History  . Marital Status: Single    Spouse Name: N/A  . Number of Children: N/A  . Years of Education: N/A   Social History Main Topics  . Smoking status: Current Every Day Smoker -- 0.25 packs/day for 5 years    Types: Cigarettes    Start date: 11/06/1984  . Smokeless tobacco: Never Used  . Alcohol Use: No  . Drug Use: No  . Sexual Activity: No   Other Topics Concern  . None   Social History Narrative    Sleep: Good  Appetite:  Good  Current Medications: Current Facility-Administered Medications  Medication Dose Route Frequency Provider Last Rate Last Dose  . acetaminophen (TYLENOL) tablet 650 mg  650 mg Oral Q6H PRN Jimmy Footman, MD      . alum & mag hydroxide-simeth (MAALOX/MYLANTA) 200-200-20 MG/5ML suspension 30 mL  30 mL Oral Q4H PRN Jimmy Footman, MD      . ARIPiprazole (ABILIFY) tablet 20 mg  20 mg Oral Daily Jimmy Footman, MD   20 mg at 07/04/15 1018  . aspirin chewable tablet 81 mg  81 mg Oral Daily Jimmy Footman, MD   81 mg at 07/04/15 1018  . docusate sodium (COLACE) capsule 200 mg  200 mg Oral BID Jimmy Footman, MD   200 mg at 07/04/15 1018  . fenofibrate tablet 160 mg  160 mg  Oral Daily Jimmy Footman, MD   160 mg at 07/04/15 1018  . glipiZIDE (GLUCOTROL) tablet 10 mg  10 mg Oral BID AC Jimmy Footman, MD   10 mg at 07/04/15 0651  . levothyroxine (SYNTHROID, LEVOTHROID) tablet 50 mcg  50 mcg Oral QAC breakfast Jimmy Footman, MD   50 mcg at 07/04/15 [phone]  . linagliptin (TRADJENTA) tablet 5 mg  5 mg Oral Daily Jimmy Footman, MD   5 mg at 07/04/15 1018  . LORazepam (ATIVAN) tablet 1 mg  1 mg Oral TID PRN Jimmy Footman, MD   1 mg at 07/01/15 2155  . magnesium hydroxide (MILK OF MAGNESIA) suspension 30 mL  30 mL Oral Daily PRN Jimmy Footman, MD      . nicotine (NICODERM CQ - dosed in mg/24 hours) patch 21 mg  21 mg Transdermal Daily PRN Jimmy Footman, MD      . nystatin (MYCOSTATIN/NYSTOP) topical powder   Topical BID Audery Amel, MD      . rosuvastatin (CRESTOR) tablet 20 mg  20 mg Oral QHS Jimmy Footman, MD   20 mg at 07/03/15 2119  . traZODone (DESYREL) tablet 100 mg  100 mg Oral QHS PRN Jimmy Footman, MD        Lab Results:  Results for orders placed or performed during the hospital encounter of 07/01/15 (from the past 48 hour(s))  Glucose, capillary     Status: Abnormal   Collection Time: 07/02/15 11:50 AM  Result Value Ref Range   Glucose-Capillary 145 (H) 65 - 99 mg/dL   Comment 1 Notify RN   Glucose, capillary     Status: Abnormal   Collection Time: 07/02/15  4:40 PM  Result Value Ref Range   Glucose-Capillary 101 (H) 65 - 99 mg/dL   Comment 1 Notify RN   Glucose, capillary     Status: Abnormal   Collection Time: 07/03/15  6:44 AM  Result Value Ref Range   Glucose-Capillary 125 (H) 65 - 99 mg/dL  TSH     Status: Abnormal   Collection Time: 07/03/15  7:38 AM  Result Value Ref Range   TSH >90.000 (H) 0.350 - 4.500 uIU/mL    Comment: NOTIFIED GWEN FARRISH ON 07/03/15 AT 1131 OF UPDATED VALUES. QSD CORRECTED ON 02/16 AT 1136: PREVIOUSLY REPORTED AS  149.000   Lipid panel     Status: Abnormal   Collection Time: 07/03/15  7:38 AM  Result Value Ref Range   Cholesterol 175 0 - 200 mg/dL   Triglycerides 295 (H) <150 mg/dL   HDL 22 (L) >62 mg/dL   Total CHOL/HDL Ratio 8.0 RATIO   VLDL 60 (H) 0 - 40 mg/dL   LDL Cholesterol 93 0 - 99 mg/dL    Comment:        Total Cholesterol/HDL:CHD Risk Coronary Heart Disease Risk Table                     Men   Women  1/2 Average Risk   3.4   3.3  Average Risk       5.0   4.4  2 X Average Risk   9.6   7.1  3 X Average Risk  23.4   11.0        Use the calculated Patient Ratio above and the CHD Risk Table to determine the patient's CHD Risk.        ATP III CLASSIFICATION (LDL):  <100     mg/dL   Optimal  161-096  mg/dL   Near or Above                    Optimal  130-159  mg/dL   Borderline  045-409  mg/dL   High  >811     mg/dL   Very High   Hemoglobin A1c     Status: None   Collection Time: 07/03/15  7:38 AM  Result Value Ref Range   Hgb A1c MFr Bld 5.8 4.0 - 6.0 %  Prolactin     Status: None   Collection Time: 07/03/15  7:38 AM  Result Value Ref Range   Prolactin 15.1 4.8 - 23.3 ng/mL    Comment: (NOTE) Performed At: [facility] [address] [address] [place], [place] [id number] Mila Homer MD OZ:[id number]   Glucose, capillary     Status: Abnormal   Collection Time: 07/03/15 12:12 PM  Result Value Ref Range   Glucose-Capillary 116 (H) 65 - 99 mg/dL  Glucose, capillary     Status: Abnormal   Collection Time: 07/03/15  4:13 PM  Result Value Ref Range   Glucose-Capillary 100 (H) 65 - 99 mg/dL  Glucose, capillary     Status: Abnormal   Collection Time: 07/04/15  6:51 AM  Result Value Ref Range   Glucose-Capillary 138 (H) 65 - 99 mg/dL    Blood Alcohol level:  Lab Results  Component Value Date   ETH <5 06/30/2015    Physical Findings: AIMS:  , ,  ,  ,    CIWA:    COWS:     Musculoskeletal: Strength & Muscle Tone: within normal limits Gait & Station:  normal Patient leans: N/A  Psychiatric Specialty Exam: Review of Systems  Constitutional: Negative.   HENT: Negative.   Eyes: Negative.   Respiratory: Negative.   Cardiovascular: Negative.   Gastrointestinal: Negative.   Genitourinary: Negative.   Musculoskeletal: Negative.   Skin: Negative.   Neurological: Negative.   Endo/Heme/Allergies: Negative.   Psychiatric/Behavioral: Negative.     Blood pressure 131/73, pulse 73, temperature 98 F (36.7 C), temperature source Oral, resp. rate 20, height 5\' 3"  (1.6 m), weight 121.564 kg (268 lb).Body mass index is 47.49 kg/(m^2).  General Appearance: Disheveled  Eye Contact::  Good  Speech:  Pressured  Volume:  Normal  Mood:  Euphoric  Affect:  Appropriate  Thought Process:  Tangential  Orientation:  Full (Time, Place, and Person)  Thought Content:  Hallucinations: None  Suicidal Thoughts:  No  Homicidal Thoughts:  No  Memory:  Immediate;   Fair Recent;   Good Remote;   Good  Judgement:  Impaired  Insight:  Shallow  Psychomotor Activity:  Normal  Concentration:  Fair  Recall:  [employer] of Knowledge:Good  Language: Good  Akathisia:  No  Handed:    AIMS (if indicated):     Assets:  Psychologist, counselling Resources/Insurance  ADL's:  Intact  Cognition: WNL  Sleep:  Number of Hours: 7.25  Treatment Plan Summary: Daily contact with patient to assess and evaluate symptoms and progress in treatment and Medication management   Bipolar disorder type I: Patient has been started on Abilify on February 16. Continue Abilify 20 mg daily. We might consider increasing up to 30 over the weekend.  Insomnia: I will order Ativan 1 mg by mouth daily at bedtime. Also trazodone 100 mg by mouth daily at bedtime when necessary is ordered.  Patient reports his sleeping well and per nursing she is slept 7 hours  Agitation: Ativan 1 mg every 8 hours as needed disorder as a prn  For constipation patient will be started on Colace 200 mg  by mouth twice a day  Dyslipidemia patient will be continued on fenofibrate 160 mg a day and Crestor 20 mg a day  Diabetes the patient will be continued on Glucotrol 10 mg by mouth daily and try gentamicin 5 mg a day.  Hypothyroidism the patient will be continued on Synthroid 50 g daily.--- TSH greater than 90. I will consult endocrinology,  this is likely due to poor compliance  Tobacco use disorder: Continue nicotine patch at 21 mg a day  Diet carb modified  Precautions every 15 minute checks  Vital signs daily  Dispo: will d/c home once stable.  F/u will need to be set up with a psychiatrist.   Jimmy Footman, MD 07/04/2015, 10:33 AM

## 2015-07-04 NOTE — Plan of Care (Signed)
Problem: Ineffective individual coping Goal: LTG: Patient will report a decrease in negative feelings Outcome: Progressing Patient reports feeling "great" today

## 2015-07-04 NOTE — Progress Notes (Signed)
D:  Patient is alert and somewhat oriented on the unit this shift.  Patient attended and actively participated in groups today.  Patient denies suicidal ideation, homicidal ideation, auditory or visual hallucinations at the present time.  Patient states she feels "great."  Patient does not believe she needs to be here in the hospital at this time. A:  Scheduled medications are administered to patient as per MD orders.  Emotional support and encouragement are provided.  Patient is maintained on q.15 minute safety checks.  Patient is informed to notify staff with questions or concerns. R:  No adverse medication reactions are noted.  Patient is cooperative with medication administration and treatment plan today.  Patient is receptive, calm and cooperative on the unit at this time.  Patient interacts well with others on the unit this shift.  Patient contracts for safety at this time.  Patient remains safe at this time.

## 2015-07-04 NOTE — [facility] Group Notes (Signed)
[facility] LCSW Group Therapy  07/04/2015 8:55 AM  Type of Therapy:  Group Therapy  Participation Level:  Active  Participation Quality:  Appropriate and Attentive  Affect:  Appropriate  Cognitive:  Alert, Appropriate and Oriented  Insight:  Engaged  Engagement in Therapy:  Engaged  Modes of Intervention:  Discussion, Socialization and Support  Summary of Progress/Problems: Patient attended and participated appropriately in group introducing herself and participating in introductory exercise sharing her 'super power' is "live, love, have faith and home". Patient offered support to other group members and shared her struggle with coping with stress in life and was able to identify changes that she wants to make in her life.  Lulu Riding, MSW, LCSWA 07/04/2015, 8:55 AM

## 2015-07-04 NOTE — Progress Notes (Signed)
Recreation Therapy Notes  Date: 02.17.17 Time: 3:00 pm Location: Community Room  Group Topic: Self-expression/Coping Skills  Goal Area(s) Addresses:  Patient will effectively use art as a Associate Professor. Patient will recognize positive benefit of coping skills. Patient will be able to identify one emotion experienced during group session. Patient will identify use of art as a coping skill.  Behavioral Response: Attentive, Interactive, Disruptive  Intervention: Two Faces of Me  Activity: Patients were given a blank face worksheet and instructed to draw or write how they felt when they were admitted into the hospital and draw or write how they want to feel when they are d/c.  Education:LRT educated patients on healthy coping skills.   Education Outcome: In group clarification offered  Clinical Observations/Feedback: Patient completed activity by writing how she felt when she was admitted and writing how she wanted to feel when she was d/c. Patient contributed to group discussion by stating how art is a good coping skill. Patient had side conversation during group discussion. LRT redirected and patient complied.  Jacquelynn Cree, LRT/CTRS 07/04/2015 3:55 PM

## 2015-07-04 NOTE — Plan of Care (Signed)
Problem: Ineffective individual coping Goal: STG: Patient will remain free from self harm Outcome: Progressing Patient remains free from self harm this shift     

## 2015-07-04 NOTE — [facility] Group Notes (Signed)
[facility] Group Notes:  (Nursing/MHT/Case Management/Adjunct)  Date:  07/04/2015  Time:  4:35 PM  Type of Therapy:  Psychoeducational Skills  Participation Level:  Active  Participation Quality:  Appropriate, Attentive, Sharing and Supportive  Affect:  Appropriate  Cognitive:  Appropriate  Insight:  Appropriate  Engagement in Group:  Supportive  Modes of Intervention:  Discussion and Education  Summary of Progress/Problems:  Marquette Old 07/04/2015, 4:35 PM

## 2015-07-04 NOTE — Consult Note (Signed)
ENDOCRINOLOGY CONSULTATION  REFERRING PHYSICIAN:  Jimmy Footman, MD CONSULTING PHYSICIAN:  A. Wendall Mola, MD.  CHIEF COMPLAINT:  Hypothyroidism  History of Present Illness  Julie Morrow is a 65 y.o. female  With h/o hypothyroidism and bipolar disorder admitted to behavioral medicine as IVC for manic symptoms. She is seen in consultation for hypothyroidism. She was diagnosed with hypothyroidism >10 yrs ago. Hypothyroidism is now managed by Julie Morrow, ANP.   On 07/03/15, TSH level quite elevated at >90 uIU/ml.  Only prior TSH available for review was on 05/23/14 when her TSH level was elevated at 48 uIU/ml.  Reviewed her electronic record and called her pharmacy to review dosing history: Since 06/09/15, she has been taking levothyroxine 50 mcg daily. From 03/2015 - 05/2015, she was taking levothyroxine 200 mcg daily From 05/2014 - 03/2015, she was taking levothyroxine 250 mcg daily (125 mcg x2 tabs)  She denies cold intolerance. Denies fatigue. Reports she feels very well. Denies dry skin. Has occasional constipation.   Medical history Past Medical History  Diagnosis Date  . Hypothyroidism   . Depression   . Anxiety   . Diabetes mellitus, type II (HCC)     Patient takes Glucotrol and Januvia    Surgical history Past Surgical History  Procedure Laterality Date  . Abdominal hysterectomy       Family history Family History  Problem Relation Age of Onset  . Dementia Mother   . Arthritis Father      Social History Social History  Substance Use Topics  . Smoking status: Current Every Day Smoker -- 0.25 packs/day for 5 years    Types: Cigarettes    Start date: 11/06/1984  . Smokeless tobacco: Never Used  . Alcohol Use: No     Allergies Allergies  Allergen Reactions  . Neomycin-Bacitracin Zn-Polymyx Rash      Medications . [START ON 07/05/2015] ARIPiprazole  30 mg Oral Daily  . aspirin  81 mg Oral Daily  . docusate sodium  200 mg Oral BID  .  fenofibrate  160 mg Oral Daily  . glipiZIDE  10 mg Oral BID AC  . levothyroxine  50 mcg Oral QAC breakfast  . linagliptin  5 mg Oral Daily  . nystatin   Topical BID  . rosuvastatin  20 mg Oral QHS     Review of Systems GENERAL:  Patient denies weight-loss, weight-gain or fevers.  Patient denies fatigue. NEUROLOGIC:  Patient denies headaches. EYES:  Patient denies blurred vision. NECK:  Patient denies neck swelling, neck pain or difficulty with swallowing. CARDIAC:  Patient denies chest pain or palpitations. RESPIRATORY:  Patient denies SOB.  Patient denies cough.   GASTROINTESTINAL:  Patient denies abdominal pain, nausea and vomiting and constipation.  Patient  denies blood in the stool. GENITOURINARY:  Patient denies dysuria and hematuria.  EXTREMITIES:  Patient denies leg swelling. HEME:  Patient denies easy bruisability. SKIN:  Patient denies any rash.    Exam BP 131/73 mmHg  Pulse 73  Temp(Src) 98 F (36.7 C) (Oral)  Resp 20  Ht  (1.6 m)  Wt 121.564 kg (268 lb)  BMI 47.49 kg/m2  GEN: Morbidy obese white  female, in NAD. HEENT: No proptosis, lid lag or stare. Oropharynx is clear. EOMI. NECK: supple, trachea midline, thyroid exam demonstrates mild thyromegaly, no neck TTP and no palpable nodules.  RESPIRATORY: clear bilaterally, no wheeze, good inspiratory effort. CV: No carotid bruits, RRR. MUSCULOSKELETAL: normal gait and station, no clubbing, no tremor ABD: soft, NT/ND,  no organomegaly EXT: no edema SKIN: no dermatopathy or ras. Thinning of hair diffusely on scalp. LYMPH: no submandibular or supraclavicular LAD NEURO: PERRL, no dysarthria. PSYC: alert and oriented, fair insight, normal affect, cooperative.   Labs Results for orders placed or performed during the hospital encounter of 07/01/15 (from the past 24 hour(s))  Glucose, capillary     Status: Abnormal   Collection Time: 07/03/15  4:13 PM  Result Value Ref Range   Glucose-Capillary 100 (H) 65 - 99  mg/dL  Glucose, capillary     Status: Abnormal   Collection Time: 07/04/15  6:51 AM  Result Value Ref Range   Glucose-Capillary 138 (H) 65 - 99 mg/dL  Glucose, capillary     Status: Abnormal   Collection Time: 07/04/15 12:00 PM  Result Value Ref Range   Glucose-Capillary 146 (H) 65 - 99 mg/dL  Glucose, capillary     Status: Abnormal   Collection Time: 07/04/15  4:02 PM  Result Value Ref Range   Glucose-Capillary 138 (H) 65 - 99 mg/dL    Assessment Hypothyroidism, severely uncontrolled   Bipolar disorder  Plan -We discussed hypothyroidism including treatment and natural history.  -Reviewed out-patient dosing of levothyroxine at length. Seems that levothyroxine has been underdosed, leading to uncontrolled hypothyroidism.  -Recommend we increase levothyroxine to 200 mcg daily again. -Repeat TSH in 4 weeks. -Results need to be communicated to her PCP, [name], as I am concerned about compliance as out-patient.   -Additionally, it would be helpful if the 50 mcg rxn was discontinued at her local pharmacy and new prescription for the 200 mcg tabs be given at hospital discharge.   Call if concerns.

## 2015-07-04 NOTE — [facility] Group Notes (Signed)
[facility] LCSW Aftercare Discharge Planning Group Note   07/04/2015 11:10 AM  Participation Quality:  Patient attended and participated in group discussion appropriately introducing herself and sharing her SMART goal is to "stay positive to balance my negative thoughts". Patient shared her thoughts of relapse and identified relapse as going back to old behaviors.  Mood/Affect:  Appropriate  Depression Rating:  0  Anxiety Rating:  0  Thoughts of Suicide:  No Will you contract for safety?   NA  Current AVH:  No  Plan for Discharge/Comments:  Has housing and outpatient follow up arrangements  Transportation Means: patient reports she has transportation at discharge  Supports: patient has family support and outpatient provider.  Lulu Riding, MSW, Theresia Majors  07/04/2015

## 2015-07-04 NOTE — [facility] Group Notes (Signed)
[facility] LCSW Group Therapy  07/04/2015 3:41 PM  Type of Therapy:  Group Therapy  Participation Level:  Active  Participation Quality:  Appropriate and Attentive  Affect:  Appropriate  Cognitive:  Alert, Appropriate and Oriented  Insight:  Engaged  Engagement in Therapy:  Engaged  Modes of Intervention:  Discussion, Socialization and Support  Summary of Progress/Problems: Patient attended and participated in group discussion appropriately introducing herself and sharing during an introductory exercise the 3 things she would take if she were stranded on a deserted [place] would be "batteries, a flashlight, and water." Patient participated throughout group discussion and offered support for other group members but did not share any challenges that she has personally with relapse and recovery.  Lulu Riding, MSW, LCSWA 07/04/2015, 3:41 PM

## 2015-07-04 NOTE — Progress Notes (Signed)
D: Pt seen in milieu interacting appropriately with peers and staff. Affect is bright. Pt states her goal for today was "to think positive things only." Denies SI/HI/AVH at this time. Denies pain. No concerns or complaints at this time. A: Emotional support and encouragement provided. Medications administered with education. q15 minute safety checks maintained. R: Pt remains free from harm.

## 2015-07-05 LAB — GLUCOSE, CAPILLARY
Glucose-Capillary: 126 mg/dL — ABNORMAL HIGH (ref 65–99)
Glucose-Capillary: 108 mg/dL — ABNORMAL HIGH (ref 65–99)

## 2015-07-05 NOTE — [facility] Group Notes (Signed)
[facility] LCSW Group Therapy  07/05/2015 4:09 PM  Type of Therapy:  Group Therapy  Participation Level:  Active  Participation Quality:  Attentive  Affect:  Appropriate  Cognitive:  Alert  Insight:  Improving  Engagement in Therapy:  Improving  Modes of Intervention:  Discussion, Education, Socialization and Support  Summary of Progress/Problems: Self esteem: Patients discussed self esteem and how it impacts them. They discussed what aspects in their lives has influenced their self esteem. They were challenged to identify changes that are needed in order to improve self esteem.  Pt attended group and stayed the entire time. She states she loves her self and identified a few strengths.   [employer] MSW, LCSWA  07/05/2015, 4:09 PM

## 2015-07-05 NOTE — Progress Notes (Signed)
D: Observed pt in day room interacting with peers. Patient alert and oriented x4. Patient denies SI/HI/AVH. Pt affect is appropriate, bright. Pt smiles appropriately during conversation. Pt stated her day was "very good." Pt indicated she had been going to all groups and socializing with peers. Pt rated anxiety 0/10 and depression 0/10. Pt has no other complaints A: Offered active listening and support. Provided therapeutic communication. Administered scheduled medications. Assisted pt in applying Nystatin powder to abdomen.  R: Pt pleasant and cooperative. Pt medication compliant. Will continue Q15 min. checks. Safety maintained.

## 2015-07-05 NOTE — Plan of Care (Signed)
Problem: Ineffective individual coping Goal: LTG: Patient will report a decrease in negative feelings Outcome: Progressing Pt stated her day was "very good." Pt smiling and laughing at times.

## 2015-07-05 NOTE — Progress Notes (Signed)
Pt has been pleasant and cooperative. Pt has attended all unit activities. Pt denies SI and A/V hallucinations.Pt has been active on the unit.Pt completed her self inventory which she rated her depression and anxiety 0/10.

## 2015-07-05 NOTE — Progress Notes (Signed)
[facility] MD Progress Note  07/05/2015 10:28 AM LEXIS POTENZA  MRN:  [id number] Subjective:  Patient says she is doing excellent. She makes jokes constantly during assessment but is redirectable.  Patient is asking to be discharged on Monday because she wants to go to a store where there is a Engineer, civil (consulting).  The patient's mood is euphoric, her concentration is poor, her thought process is tangential, speech is pushed.  Patient denies having any issues or concerns. She wants to know when she is going to be discharged. She denies problems with sleep, appetite, energy or concentration. She denies suicidality, homicidality or having auditory or visual hallucinations. She denies side effects from medications. She denies having any physical complaints. Patient says she likes the Abilify and does not have any concerns about continuing this medication.  Dose of Abilify was increased yesterday to 30 mg. Appears patient is tolerating this medication well however patient's symptoms have not improved much since admission.    Principal Problem: Bipolar 1 disorder (HCC) Diagnosis:   Patient Active Problem List   Diagnosis Date Noted  . Bipolar 1 disorder, manic, moderate (HCC) [F31.12] 07/01/2015  . Bipolar 1 disorder (HCC) [F31.9] 07/01/2015  . H/O: obesity [Z87.898] 04/08/2015  . Depression, major, recurrent, moderate (HCC) [F33.1] 04/08/2015  . Insomnia, persistent [G47.00] 04/08/2015  . H/O elevated lipids [Z86.39] 04/08/2015  . H/O: hypothyroidism [Z86.39] 04/08/2015  . Anxiety, generalized [F41.1] 04/08/2015  . H/O gastrointestinal disease [Z87.19] 04/08/2015  . H/O diabetes mellitus [Z86.39] 04/08/2015  . Depression, major, recurrent, in remission (HCC) [F33.40] 04/08/2015  . Clinical depression [F32.9] 11/07/2014  . Acid reflux [K21.9] 11/07/2014  . HLD (hyperlipidemia) [E78.5] 11/07/2014  . Psoriasis [L40.9] 11/07/2014  . Disease of thyroid gland [E07.9] 11/07/2014   Total Time spent with patient: 30  minutes    Past Medical History:  Past Medical History  Diagnosis Date  . Hypothyroidism   . Depression   . Anxiety   . Diabetes mellitus, type II (HCC)     Patient takes Glucotrol and Januvia    Past Surgical History  Procedure Laterality Date  . Abdominal hysterectomy     Family History:  Family History  Problem Relation Age of Onset  . Dementia Mother   . Arthritis Father    Social History:  History  Alcohol Use No     History  Drug Use No    Social History   Social History  . Marital Status: Single    Spouse Name: N/A  . Number of Children: N/A  . Years of Education: N/A   Social History Main Topics  . Smoking status: Current Every Day Smoker -- 0.25 packs/day for 5 years    Types: Cigarettes    Start date: 11/06/1984  . Smokeless tobacco: Never Used  . Alcohol Use: No  . Drug Use: No  . Sexual Activity: No   Other Topics Concern  . None   Social History Narrative    Sleep: Good  Appetite:  Good  Current Medications: Current Facility-Administered Medications  Medication Dose Route Frequency Provider Last Rate Last Dose  . acetaminophen (TYLENOL) tablet 650 mg  650 mg Oral Q6H PRN Jimmy Footman, MD      . alum & mag hydroxide-simeth (MAALOX/MYLANTA) 200-200-20 MG/5ML suspension 30 mL  30 mL Oral Q4H PRN Jimmy Footman, MD      . ARIPiprazole (ABILIFY) tablet 30 mg  30 mg Oral Daily Jimmy Footman, MD   30 mg at 07/05/15 4098  . aspirin  chewable tablet 81 mg  81 mg Oral Daily Jimmy Footman, MD   81 mg at 07/05/15 1610  . docusate sodium (COLACE) capsule 200 mg  200 mg Oral BID Jimmy Footman, MD   200 mg at 07/05/15 9604  . fenofibrate tablet 160 mg  160 mg Oral Daily Jimmy Footman, MD   160 mg at 07/05/15 5409  . glipiZIDE (GLUCOTROL) tablet 10 mg  10 mg Oral BID AC Jimmy Footman, MD   10 mg at 07/05/15 8119  . levothyroxine (SYNTHROID, LEVOTHROID) tablet 200 mcg  200  mcg Oral QAC breakfast Raj Janus, MD   200 mcg at 07/05/15 [phone]  . linagliptin (TRADJENTA) tablet 5 mg  5 mg Oral Daily Jimmy Footman, MD   5 mg at 07/05/15 2956  . LORazepam (ATIVAN) tablet 1 mg  1 mg Oral TID PRN Jimmy Footman, MD   1 mg at 07/04/15 2221  . magnesium hydroxide (MILK OF MAGNESIA) suspension 30 mL  30 mL Oral Daily PRN Jimmy Footman, MD      . nicotine (NICODERM CQ - dosed in mg/24 hours) patch 21 mg  21 mg Transdermal Daily PRN Jimmy Footman, MD   21 mg at 07/05/15 0925  . nystatin (MYCOSTATIN/NYSTOP) topical powder   Topical BID Audery Amel, MD      . rosuvastatin (CRESTOR) tablet 20 mg  20 mg Oral QHS Jimmy Footman, MD   20 mg at 07/04/15 2221  . traZODone (DESYREL) tablet 100 mg  100 mg Oral QHS PRN Jimmy Footman, MD        Lab Results:  Results for orders placed or performed during the hospital encounter of 07/01/15 (from the past 48 hour(s))  Glucose, capillary     Status: Abnormal   Collection Time: 07/03/15 12:12 PM  Result Value Ref Range   Glucose-Capillary 116 (H) 65 - 99 mg/dL  Glucose, capillary     Status: Abnormal   Collection Time: 07/03/15  4:13 PM  Result Value Ref Range   Glucose-Capillary 100 (H) 65 - 99 mg/dL  Glucose, capillary     Status: Abnormal   Collection Time: 07/04/15  6:51 AM  Result Value Ref Range   Glucose-Capillary 138 (H) 65 - 99 mg/dL  Glucose, capillary     Status: Abnormal   Collection Time: 07/04/15 12:00 PM  Result Value Ref Range   Glucose-Capillary 146 (H) 65 - 99 mg/dL  Glucose, capillary     Status: Abnormal   Collection Time: 07/04/15  4:02 PM  Result Value Ref Range   Glucose-Capillary 138 (H) 65 - 99 mg/dL  Glucose, capillary     Status: Abnormal   Collection Time: 07/05/15  7:04 AM  Result Value Ref Range   Glucose-Capillary 126 (H) 65 - 99 mg/dL    Blood Alcohol level:  Lab Results  Component Value Date   ETH <5 06/30/2015     Physical Findings: AIMS:  , ,  ,  ,    CIWA:    COWS:     Musculoskeletal: Strength & Muscle Tone: within normal limits Gait & Station: normal Patient leans: N/A  Psychiatric Specialty Exam: Review of Systems  Constitutional: Negative.   HENT: Negative.   Eyes: Negative.   Respiratory: Negative.   Cardiovascular: Negative.   Gastrointestinal: Negative.   Genitourinary: Negative.   Musculoskeletal: Negative.   Skin: Negative.   Neurological: Negative.   Endo/Heme/Allergies: Negative.   Psychiatric/Behavioral: Negative.     Blood pressure 101/78, pulse 100, temperature 97.7  F (36.5 C), temperature source Oral, resp. rate 20, height 5\' 3"  (1.6 m), weight 121.564 kg (268 lb).Body mass index is 47.49 kg/(m^2).  General Appearance: Disheveled  Eye Contact::  Good  Speech:  Pressured  Volume:  Normal  Mood:  Euphoric  Affect:  Appropriate  Thought Process:  Tangential  Orientation:  Full (Time, Place, and Person)  Thought Content:  Hallucinations: None  Suicidal Thoughts:  No  Homicidal Thoughts:  No  Memory:  Immediate;   Fair Recent;   Good Remote;   Good  Judgement:  Impaired  Insight:  Shallow  Psychomotor Activity:  Normal  Concentration:  Fair  Recall:  [employer] of Knowledge:Good  Language: Good  Akathisia:  No  Handed:    AIMS (if indicated):     Assets:  Psychologist, counselling Resources/Insurance  ADL's:  Intact  Cognition: WNL  Sleep:  Number of Hours: 7   Treatment Plan Summary: Daily contact with patient to assess and evaluate symptoms and progress in treatment and Medication management   Bipolar disorder type I: Abilify was increased to 30 mg today. Patient continues to show euphoria, not much improvement since admission   Insomnia: I will order Ativan 1 mg by mouth daily at bedtime. Also trazodone 100 mg by mouth daily at bedtime when necessary is ordered.  Patient reports his sleeping well and per nursing she is slept 7  hours  Agitation: Ativan 1 mg every 8 hours as needed disorder as a prn  For constipation patient will be started on Colace 200 mg by mouth twice a day  Dyslipidemia patient will be continued on fenofibrate 160 mg a day and Crestor 20 mg a day  Diabetes the patient will be continued on Glucotrol 10 mg by mouth daily and try gentamicin 5 mg a day.  Hypothyroidism the patient will be continued on Synthroid 50 g daily.--- TSH greater than 90. I will consult endocrinology,  this is likely due to poor compliance  Tobacco use disorder: Continue nicotine patch at 21 mg a day  Diet carb modified  Precautions every 15 minute checks  Vital signs daily  Dispo: will d/c home once stable.  F/u will need to be set up with a psychiatrist.   Jimmy Footman, MD 07/05/2015, 10:28 AM

## 2015-07-06 LAB — GLUCOSE, CAPILLARY
Glucose-Capillary: 128 mg/dL — ABNORMAL HIGH (ref 65–99)
Glucose-Capillary: 154 mg/dL — ABNORMAL HIGH (ref 65–99)
Glucose-Capillary: 124 mg/dL — ABNORMAL HIGH (ref 65–99)

## 2015-07-06 NOTE — Plan of Care (Signed)
Problem: Alteration in mood; excessive anxiety as evidenced by: Goal: LTG-Patient's behavior demonstrates decreased anxiety (Patient's behavior demonstrates anxiety and he/she is utilizing learned coping skills to deal with anxiety-producing situations)  Outcome: Progressing Pt denies anxiety and appears calm.

## 2015-07-06 NOTE — Progress Notes (Signed)
D: Observed pt in day room interacting with peers. Patient alert and oriented x4. Patient denies SI/HI/AVH. Pt affect is appropriate. When asked about her day mood pt stated "It's good" and provides no further insight. Pt smiles and joked appropriately with Clinical research associate. Pt does not feel that there was a good reason for her to to have her admitted here. Pt denies depression and anxiety. Pt has no complaints. A: Offered active listening and support. Provided therapeutic communication. Administered scheduled medications. Applied nystatin powder to patient abdominal region. Abdominal folds were malodorous and red. R: Pt pleasant and cooperative. Pt medication compliant. Will continue Q15 min. checks. Safety maintained.

## 2015-07-06 NOTE — [facility] Group Notes (Signed)
[facility] LCSW Group Therapy  07/06/2015 4:37 PM  Type of Therapy:  Group Therapy  Participation Level:  Active  Participation Quality:  Attentive  Affect:  Euphoric  Cognitive:  Alert  Insight:  Improving  Engagement in Therapy:  Improving  Modes of Intervention:  Activity, Discussion, Education, Socialization and Support  Summary of Progress/Problems: Pt will identify unhealthy thoughts and how they impact their emotions and behavior. Pt will be encouraged to discuss these thoughts, emotions and behaviors with the group. Pt attended group and stayed the entire time. She worked well with a team and was supportive of other patients.   Daisy Floro Krzysztof Reichelt MSW, LCSWA  07/06/2015, 4:37 PM

## 2015-07-06 NOTE — Progress Notes (Signed)
[facility] MD Progress Note  07/06/2015 8:42 AM Julie Morrow  MRN:  [id number] Subjective:  Patient says she is doing excellent. She makes jokes constantly during assessment but is redirectable.  The patient's mood is euphoric, her concentration is poor, her thought process is tangential, speech is pushed.  Patient denies having any issues or concerns. She wants to know when she is going to be discharged. She denies problems with sleep, appetite, energy or concentration. She denies suicidality, homicidality or having auditory or visual hallucinations. She denies side effects from medications. She denies having any physical complaints. Patient says she likes the Abilify and does not have any concerns about continuing this medication.  Dose of Abilify was increased on 2/17 to 30 mg. Appears patient is tolerating this medication well however patient's symptoms have not improved much since admission.  Per nursing: D: Observed pt in day room interacting with peers. Patient alert and oriented x4. Patient denies SI/HI/AVH. Pt affect is appropriate. When asked about her day mood pt stated "It's good" and provides no further insight. Pt smiles and joked appropriately with Clinical research associate. Pt does not feel that there was a good reason for her to to have her admitted here. Pt denies depression and anxiety. Pt has no complaints. A: Offered active listening and support. Provided therapeutic communication. Administered scheduled medications. Applied nystatin powder to patient abdominal region. Abdominal folds were malodorous and red. R: Pt pleasant and cooperative. Pt medication compliant. Will continue Q15 min. checks. Safety maintained.  Principal Problem: Bipolar 1 disorder (HCC) Diagnosis:   Patient Active Problem List   Diagnosis Date Noted  . Bipolar 1 disorder, manic, moderate (HCC) [F31.12] 07/01/2015  . Bipolar 1 disorder (HCC) [F31.9] 07/01/2015  . H/O: obesity [Z87.898] 04/08/2015  . Depression, major, recurrent,  moderate (HCC) [F33.1] 04/08/2015  . Insomnia, persistent [G47.00] 04/08/2015  . H/O elevated lipids [Z86.39] 04/08/2015  . H/O: hypothyroidism [Z86.39] 04/08/2015  . Anxiety, generalized [F41.1] 04/08/2015  . H/O gastrointestinal disease [Z87.19] 04/08/2015  . H/O diabetes mellitus [Z86.39] 04/08/2015  . Depression, major, recurrent, in remission (HCC) [F33.40] 04/08/2015  . Clinical depression [F32.9] 11/07/2014  . Acid reflux [K21.9] 11/07/2014  . HLD (hyperlipidemia) [E78.5] 11/07/2014  . Psoriasis [L40.9] 11/07/2014  . Disease of thyroid gland [E07.9] 11/07/2014   Total Time spent with patient: 30 minutes    Past Medical History:  Past Medical History  Diagnosis Date  . Hypothyroidism   . Depression   . Anxiety   . Diabetes mellitus, type II (HCC)     Patient takes Glucotrol and Januvia    Past Surgical History  Procedure Laterality Date  . Abdominal hysterectomy     Family History:  Family History  Problem Relation Age of Onset  . Dementia Mother   . Arthritis Father    Social History:  History  Alcohol Use No     History  Drug Use No    Social History   Social History  . Marital Status: Single    Spouse Name: N/A  . Number of Children: N/A  . Years of Education: N/A   Social History Main Topics  . Smoking status: Current Every Day Smoker -- 0.25 packs/day for 5 years    Types: Cigarettes    Start date: 11/06/1984  . Smokeless tobacco: Never Used  . Alcohol Use: No  . Drug Use: No  . Sexual Activity: No   Other Topics Concern  . None   Social History Narrative    Sleep: Good  Appetite:  Good  Current Medications: Current Facility-Administered Medications  Medication Dose Route Frequency Provider Last Rate Last Dose  . acetaminophen (TYLENOL) tablet 650 mg  650 mg Oral Q6H PRN Jimmy Footman, MD      . alum & mag hydroxide-simeth (MAALOX/MYLANTA) 200-200-20 MG/5ML suspension 30 mL  30 mL Oral Q4H PRN Jimmy Footman,  MD      . ARIPiprazole (ABILIFY) tablet 30 mg  30 mg Oral Daily Jimmy Footman, MD   30 mg at 07/06/15 0831  . aspirin chewable tablet 81 mg  81 mg Oral Daily Jimmy Footman, MD   81 mg at 07/06/15 1914  . docusate sodium (COLACE) capsule 200 mg  200 mg Oral BID Jimmy Footman, MD   200 mg at 07/06/15 0830  . fenofibrate tablet 160 mg  160 mg Oral Daily Jimmy Footman, MD   160 mg at 07/06/15 0831  . glipiZIDE (GLUCOTROL) tablet 10 mg  10 mg Oral BID AC Jimmy Footman, MD   10 mg at 07/06/15 0830  . levothyroxine (SYNTHROID, LEVOTHROID) tablet 200 mcg  200 mcg Oral QAC breakfast Raj Janus, MD   200 mcg at 07/06/15 7829  . linagliptin (TRADJENTA) tablet 5 mg  5 mg Oral Daily Jimmy Footman, MD   5 mg at 07/06/15 0831  . LORazepam (ATIVAN) tablet 1 mg  1 mg Oral TID PRN Jimmy Footman, MD   1 mg at 07/04/15 2221  . magnesium hydroxide (MILK OF MAGNESIA) suspension 30 mL  30 mL Oral Daily PRN Jimmy Footman, MD      . nicotine (NICODERM CQ - dosed in mg/24 hours) patch 21 mg  21 mg Transdermal Daily PRN Jimmy Footman, MD   21 mg at 07/05/15 0925  . nystatin (MYCOSTATIN/NYSTOP) topical powder   Topical BID Audery Amel, MD      . rosuvastatin (CRESTOR) tablet 20 mg  20 mg Oral QHS Jimmy Footman, MD   20 mg at 07/05/15 2153  . traZODone (DESYREL) tablet 100 mg  100 mg Oral QHS PRN Jimmy Footman, MD        Lab Results:  Results for orders placed or performed during the hospital encounter of 07/01/15 (from the past 48 hour(s))  Glucose, capillary     Status: Abnormal   Collection Time: 07/04/15 12:00 PM  Result Value Ref Range   Glucose-Capillary 146 (H) 65 - 99 mg/dL  Glucose, capillary     Status: Abnormal   Collection Time: 07/04/15  4:02 PM  Result Value Ref Range   Glucose-Capillary 138 (H) 65 - 99 mg/dL  Glucose, capillary     Status: Abnormal   Collection Time:  07/05/15  7:04 AM  Result Value Ref Range   Glucose-Capillary 126 (H) 65 - 99 mg/dL  Glucose, capillary     Status: Abnormal   Collection Time: 07/05/15  4:28 PM  Result Value Ref Range   Glucose-Capillary 108 (H) 65 - 99 mg/dL   Comment 1 Notify RN   Glucose, capillary     Status: Abnormal   Collection Time: 07/06/15  6:34 AM  Result Value Ref Range   Glucose-Capillary 128 (H) 65 - 99 mg/dL    Blood Alcohol level:  Lab Results  Component Value Date   ETH <5 06/30/2015    Physical Findings: AIMS:  , ,  ,  ,    CIWA:    COWS:     Musculoskeletal: Strength & Muscle Tone: within normal limits Gait & Station: normal Patient leans:  N/A  Psychiatric Specialty Exam: Review of Systems  Constitutional: Negative.   HENT: Negative.   Eyes: Negative.   Respiratory: Negative.   Cardiovascular: Negative.   Gastrointestinal: Negative.   Genitourinary: Negative.   Musculoskeletal: Negative.   Skin: Negative.   Neurological: Negative.   Endo/Heme/Allergies: Negative.   Psychiatric/Behavioral: Negative.     Blood pressure 79/59, pulse 75, temperature 97.8 F (36.6 C), temperature source Oral, resp. rate 20, height 5\' 3"  (1.6 m), weight 121.564 kg (268 lb).Body mass index is 47.49 kg/(m^2).  General Appearance: Disheveled  Eye Contact::  Good  Speech:  Pressured  Volume:  Normal  Mood:  Euphoric  Affect:  Appropriate  Thought Process:  Tangential  Orientation:  Full (Time, Place, and Person)  Thought Content:  Hallucinations: None  Suicidal Thoughts:  No  Homicidal Thoughts:  No  Memory:  Immediate;   Fair Recent;   Good Remote;   Good  Judgement:  Impaired  Insight:  Shallow  Psychomotor Activity:  Normal  Concentration:  Fair  Recall:  [employer] of Knowledge:Good  Language: Good  Akathisia:  No  Handed:    AIMS (if indicated):     Assets:  Psychologist, counselling Resources/Insurance  ADL's:  Intact  Cognition: WNL  Sleep:  Number of Hours: 5.25    Treatment Plan Summary: Daily contact with patient to assess and evaluate symptoms and progress in treatment and Medication management   Bipolar disorder type I: Abilify was increased to 30 mg today. Patient continues to show euphoria, not much improvement since admission   Insomnia: I will order Ativan 1 mg by mouth daily at bedtime. Also trazodone 100 mg by mouth daily at bedtime when necessary is ordered.  Patient reports his sleeping well and per nursing she is slept 7 hours  Agitation: Ativan 1 mg every 8 hours as needed disorder as a prn  For constipation patient will be started on Colace 200 mg by mouth twice a day  Dyslipidemia patient will be continued on fenofibrate 160 mg a day and Crestor 20 mg a day  Diabetes the patient will be continued on Glucotrol 10 mg by mouth daily and try gentamicin 5 mg a day.  Hypothyroidism the patient will be continued on Synthroid 50 g daily.--- TSH greater than 90. I will consult endocrinology,  this is likely due to poor compliance  Tobacco use disorder: Continue nicotine patch at 21 mg a day  Diet carb modified  Precautions every 15 minute checks  Vital signs daily  Dispo: will d/c home once stable.  I have requested to the social worker to touch base with her family in order for [place] to start planning for discharge early next week.  F/u will need to be set up with a psychiatrist.   Jimmy Footman, MD 07/06/2015, 8:42 AM

## 2015-07-06 NOTE — Progress Notes (Signed)
Pt has been pleasant and cooperative. Pt has attended all unit activities. Pt denies SI and A/V hallucinations.Pt has been active on the unit.Pt completed her self inventory which she rated her depression , anxiety and hopelessness 0/10.

## 2015-07-07 ENCOUNTER — Encounter: Payer: Self-pay | Admitting: Psychiatry

## 2015-07-07 DIAGNOSIS — E119 Type 2 diabetes mellitus without complications: Secondary | ICD-10-CM

## 2015-07-07 DIAGNOSIS — E039 Hypothyroidism, unspecified: Secondary | ICD-10-CM

## 2015-07-07 DIAGNOSIS — F17210 Nicotine dependence, cigarettes, uncomplicated: Secondary | ICD-10-CM

## 2015-07-07 LAB — GLUCOSE, CAPILLARY
GLUCOSE-CAPILLARY: 93 mg/dL (ref 65–99)
Glucose-Capillary: 125 mg/dL — ABNORMAL HIGH (ref 65–99)

## 2015-07-07 MED ORDER — ARIPIPRAZOLE ER 400 MG IM SUSR: 400.0000 mg | INTRAMUSCULAR | Status: DC

## 2015-07-07 MED ORDER — LEVOTHYROXINE SODIUM 200 MCG PO TABS: 200.0000 ug | ORAL_TABLET | Freq: Every day | ORAL | Status: DC

## 2015-07-07 MED ORDER — FLUTICASONE PROPIONATE 50 MCG/ACT NA SUSP
2.0000 | Freq: Every day | NASAL | Status: DC
  Administered 2015-07-07 – 2015-07-08 (×2): 2 via NASAL
  Filled 2015-07-07: qty 16

## 2015-07-07 MED ORDER — ARIPIPRAZOLE 30 MG PO TABS: 30.0000 mg | ORAL_TABLET | Freq: Every day | ORAL | Status: DC

## 2015-07-07 MED ORDER — NAPHAZOLINE-GLYCERIN 0.012-0.2 % OP SOLN
2.0000 [drp] | Freq: Four times a day (QID) | OPHTHALMIC | Status: DC | PRN
  Administered 2015-07-07 – 2015-07-08 (×2): 2 [drp] via OPHTHALMIC
  Filled 2015-07-07: qty 15

## 2015-07-07 MED ORDER — ARIPIPRAZOLE ER 400 MG IM SUSR
400.0000 mg | INTRAMUSCULAR | Status: DC
  Administered 2015-07-07: 400 mg via INTRAMUSCULAR
  Filled 2015-07-07: qty 400

## 2015-07-07 NOTE — [facility] Suicide Risk Assessment (Signed)
[facility] Discharge Suicide Risk Assessment   Principal Problem: Bipolar 1 disorder, manic, moderate (HCC) Discharge Diagnoses:  Patient Active Problem List   Diagnosis Date Noted  . Diabetes type 2, controlled (HCC) [E11.9] 07/07/2015  . Obesity [E66.9] 07/07/2015  . Tobacco use disorder [F17.200] 07/07/2015  . Hypothyroidism [E03.9] 07/07/2015  . Bipolar 1 disorder, manic, moderate (HCC) [F31.12] 07/01/2015  . Acid reflux [K21.9] 11/07/2014  . HLD (hyperlipidemia) [E78.5] 11/07/2014  . Psoriasis [L40.9] 11/07/2014    Total Time spent with patient: 30 minutes  Psychiatric Specialty Exam: ROS                                                         Mental Status Per Nursing Assessment::   On Admission:     Demographic Factors:  Caucasian  Loss Factors: Financial problems/change in socioeconomic status  Historical Factors: Prior suicide attempts and Impulsivity  Risk Reduction Factors:   Religious beliefs about death and Positive social support  Continued Clinical Symptoms:  Previous Psychiatric Diagnoses and Treatments  Cognitive Features That Contribute To Risk:  None    Suicide Risk:  Minimal: No identifiable suicidal ideation.  Patients presenting with no risk factors but with morbid ruminations; may be classified as minimal risk based on the severity of the depressive symptoms  Follow-up Information    Follow up with [facility], NP.   Specialty:  Nurse Practitioner   Contact information:   [address] [place] [address] [phone]         Jimmy Footman, MD 07/08/2015, 9:34 AM

## 2015-07-07 NOTE — Plan of Care (Signed)
Problem: Alteration in mood Goal: STG-Patient does not harm self or others Outcome: Progressing Patient has remained free from harm since admission.
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
## 2015-07-07 NOTE — Progress Notes (Signed)
[facility] MD Progress Note  07/07/2015 12:01 PM Julie Morrow  MRN:  [id number] Subjective:  Patient says she is doing excellent. She makes jokes constantly during assessment but is redirectable.  The patient's mood is euphoric, her concentration is poor, her thought process is tangential, speech is pushed.  Patient denies having any issues or concerns. She likes taking the Abilify. Says that she has not had any side effects and feels that the medication is working well. She agrees with this even Abilify Maintenna today.    Patient denies suicidality homicidality or having auditory or visual hallucinations. She has been is sleeping well per nurse's and has been compliant with medications  Per nursing: D: Patient appears mildly confused and disorganized. She has had to episodes this evening of urine incontinence and has had to ask for new scrubs. She is is visible in the milieu seen watching TV and talking to peers. She denies SI/HI/AVH. She denies pain.  A: Medication was given with education. Encouragement was provided. Pad was provided for incontinence.  R: Patient was compliant with medication. She has remained calm and cooperative. Safety maintained with 15 min checks.   Principal Problem: Bipolar 1 disorder (HCC) Diagnosis:   Patient Active Problem List   Diagnosis Date Noted  . Bipolar 1 disorder, manic, moderate (HCC) [F31.12] 07/01/2015  . Bipolar 1 disorder (HCC) [F31.9] 07/01/2015  . H/O: obesity [Z87.898] 04/08/2015  . Depression, major, recurrent, moderate (HCC) [F33.1] 04/08/2015  . Insomnia, persistent [G47.00] 04/08/2015  . H/O elevated lipids [Z86.39] 04/08/2015  . H/O: hypothyroidism [Z86.39] 04/08/2015  . Anxiety, generalized [F41.1] 04/08/2015  . H/O gastrointestinal disease [Z87.19] 04/08/2015  . H/O diabetes mellitus [Z86.39] 04/08/2015  . Depression, major, recurrent, in remission (HCC) [F33.40] 04/08/2015  . Clinical depression [F32.9] 11/07/2014  . Acid reflux  [K21.9] 11/07/2014  . HLD (hyperlipidemia) [E78.5] 11/07/2014  . Psoriasis [L40.9] 11/07/2014  . Disease of thyroid gland [E07.9] 11/07/2014   Total Time spent with patient: 30 minutes    Past Medical History:  Past Medical History  Diagnosis Date  . Hypothyroidism   . Depression   . Anxiety   . Diabetes mellitus, type II (HCC)     Patient takes Glucotrol and Januvia    Past Surgical History  Procedure Laterality Date  . Abdominal hysterectomy     Family History:  Family History  Problem Relation Age of Onset  . Dementia Mother   . Arthritis Father    Social History:  History  Alcohol Use No     History  Drug Use No    Social History   Social History  . Marital Status: Single    Spouse Name: N/A  . Number of Children: N/A  . Years of Education: N/A   Social History Main Topics  . Smoking status: Current Every Day Smoker -- 0.25 packs/day for 5 years    Types: Cigarettes    Start date: 11/06/1984  . Smokeless tobacco: Never Used  . Alcohol Use: No  . Drug Use: No  . Sexual Activity: No   Other Topics Concern  . None   Social History Narrative    Sleep: Good  Appetite:  Good  Current Medications: Current Facility-Administered Medications  Medication Dose Route Frequency Provider Last Rate Last Dose  . acetaminophen (TYLENOL) tablet 650 mg  650 mg Oral Q6H PRN Jimmy Footman, MD      . alum & mag hydroxide-simeth (MAALOX/MYLANTA) 200-200-20 MG/5ML suspension 30 mL  30 mL Oral Q4H PRN Sue Lush  Hernandez-Gonzalez, MD      . ARIPiprazole (ABILIFY) tablet 30 mg  30 mg Oral Daily Jimmy Footman, MD   30 mg at 07/07/15 0865  . ARIPiprazole SUSR 400 mg  400 mg Intramuscular Q30 days Jimmy Footman, MD      . aspirin chewable tablet 81 mg  81 mg Oral Daily Jimmy Footman, MD   81 mg at 07/07/15 0920  . docusate sodium (COLACE) capsule 200 mg  200 mg Oral BID Jimmy Footman, MD   200 mg at 07/07/15 0920  .  fenofibrate tablet 160 mg  160 mg Oral Daily Jimmy Footman, MD   160 mg at 07/07/15 0920  . fluticasone (FLONASE) 50 MCG/ACT nasal spray 2 spray  2 spray Each Nare Daily Jimmy Footman, MD      . glipiZIDE (GLUCOTROL) tablet 10 mg  10 mg Oral BID AC Jimmy Footman, MD   10 mg at 07/07/15 7846  . levothyroxine (SYNTHROID, LEVOTHROID) tablet 200 mcg  200 mcg Oral QAC breakfast Raj Janus, MD   200 mcg at 07/07/15 0720  . linagliptin (TRADJENTA) tablet 5 mg  5 mg Oral Daily Jimmy Footman, MD   5 mg at 07/07/15 0920  . LORazepam (ATIVAN) tablet 1 mg  1 mg Oral TID PRN Jimmy Footman, MD   1 mg at 07/04/15 2221  . magnesium hydroxide (MILK OF MAGNESIA) suspension 30 mL  30 mL Oral Daily PRN Jimmy Footman, MD      . naphazoline-glycerin (CLEAR EYES) ophth solution 2 drop  2 drop Both Eyes QID PRN Jimmy Footman, MD      . nicotine (NICODERM CQ - dosed in mg/24 hours) patch 21 mg  21 mg Transdermal Daily PRN Jimmy Footman, MD   21 mg at 07/05/15 0925  . nystatin (MYCOSTATIN/NYSTOP) topical powder   Topical BID Audery Amel, MD      . rosuvastatin (CRESTOR) tablet 20 mg  20 mg Oral QHS Jimmy Footman, MD   20 mg at 07/06/15 2157  . traZODone (DESYREL) tablet 100 mg  100 mg Oral QHS PRN Jimmy Footman, MD        Lab Results:  Results for orders placed or performed during the hospital encounter of 07/01/15 (from the past 48 hour(s))  Glucose, capillary     Status: Abnormal   Collection Time: 07/05/15  4:28 PM  Result Value Ref Range   Glucose-Capillary 108 (H) 65 - 99 mg/dL   Comment 1 Notify RN   Glucose, capillary     Status: Abnormal   Collection Time: 07/06/15  6:34 AM  Result Value Ref Range   Glucose-Capillary 128 (H) 65 - 99 mg/dL  Glucose, capillary     Status: Abnormal   Collection Time: 07/06/15 11:51 AM  Result Value Ref Range   Glucose-Capillary 154 (H) 65 - 99 mg/dL   Glucose, capillary     Status: Abnormal   Collection Time: 07/06/15  4:31 PM  Result Value Ref Range   Glucose-Capillary 124 (H) 65 - 99 mg/dL  Glucose, capillary     Status: Abnormal   Collection Time: 07/07/15  7:19 AM  Result Value Ref Range   Glucose-Capillary 125 (H) 65 - 99 mg/dL    Blood Alcohol level:  Lab Results  Component Value Date   ETH <5 06/30/2015    Physical Findings: AIMS:  , ,  ,  ,    CIWA:    COWS:     Musculoskeletal: Strength & Muscle Tone: within normal  limits Gait & Station: normal Patient leans: N/A  Psychiatric Specialty Exam: Review of Systems  Constitutional: Negative.   HENT: Negative.   Eyes: Negative.   Respiratory: Negative.   Cardiovascular: Negative.   Gastrointestinal: Negative.   Genitourinary: Negative.   Musculoskeletal: Negative.   Skin: Negative.   Neurological: Negative.   Endo/Heme/Allergies: Negative.   Psychiatric/Behavioral: Negative.     Blood pressure 124/74, pulse 73, temperature 97.8 F (36.6 C), temperature source Oral, resp. rate 20, height  (1.6 m), weight 121.564 kg (268 lb).Body mass index is 47.49 kg/(m^2).  General Appearance: Disheveled  Eye Contact::  Good  Speech:  Pressured  Volume:  Normal  Mood:  Euphoric  Affect:  Appropriate  Thought Process:  Tangential  Orientation:  Full (Time, Place, and Person)  Thought Content:  Hallucinations: None  Suicidal Thoughts:  No  Homicidal Thoughts:  No  Memory:  Immediate;   Fair Recent;   Good Remote;   Good  Judgement:  Impaired  Insight:  Shallow  Psychomotor Activity:  Normal  Concentration:  Fair  Recall:  [employer] of Knowledge:Good  Language: Good  Akathisia:  No  Handed:    AIMS (if indicated):     Assets:  Psychologist, counselling Resources/Insurance  ADL's:  Intact  Cognition: WNL  Sleep:  Number of Hours: 3.75   Treatment Plan Summary: Daily contact with patient to assess and evaluate symptoms and progress in treatment  and Medication management   Bipolar disorder type I: Continue Abilify 30 mg by mouth daily. I will order Abilify maintenna 400 mg today.  Insomnia: Continue Ativan when necessary. She only slept 3 hours last night.  Agitation: Ativan 1 mg every 8 hours as needed disorder as a prn  For constipation patient will be started on Colace 200 mg by mouth twice a day  Dyslipidemia patient will be continued on fenofibrate 160 mg a day and Crestor 20 mg a day  Diabetes the patient will be continued on Glucotrol 10 mg by mouth daily and try gentamicin 5 mg a day.  Hypothyroidism the patient will be continued on Synthroid 50 g daily.--- TSH greater than 90. I will consult endocrinology,  this is likely due to poor compliance  Tobacco use disorder: Continue nicotine patch at 21 mg a day  Diet carb modified  Precautions every 15 minute checks  Vital signs daily  Dispo: will d/c home once stable.  I have requested to the social worker to touch base with her family in order for [place] to start planning for discharge likely tomorrow.   F/u will need to be set up with a psychiatrist.   Jimmy Footman, MD 07/07/2015, 12:01 PM

## 2015-07-07 NOTE — [facility] Group Notes (Signed)
[facility] Group Notes:  (Nursing/MHT/Case Management/Adjunct)  Date:  07/07/2015  Time:  2:41 PM  Type of Therapy:  Psychoeducational Skills  Participation Level:  Active  Participation Quality:  Appropriate, Attentive and Supportive  Affect:  Appropriate  Cognitive:  Appropriate  Insight:  Appropriate  Engagement in Group:  Supportive  Modes of Intervention:  Discussion and Education  Summary of Progress/Problems:  Julie Morrow 07/07/2015, 2:41 PM

## 2015-07-07 NOTE — Progress Notes (Signed)
D: Patient appears still mildly confused and disorganized. No known episodes of urine incontinence this evening. She has been visible in the milieu seen watching TV and talking to peers. She denies SI/HI/AVH. She denies pain.  A: Medication was given with education. Encouragement was provided.  R: Patient was compliant with medication. She has remained calm and cooperative. Safety maintained with 15 min checks.

## 2015-07-07 NOTE — Progress Notes (Signed)
D: Patient appears mildly confused and disorganized. She has had to episodes this evening of urine incontinence and has had to ask for new scrubs. She is is visible in the milieu seen watching TV and talking to peers. She denies SI/HI/AVH. She denies pain.  A: Medication was given with education. Encouragement was provided. Pad was provided for incontinence.  R: Patient was compliant with medication. She has remained calm and cooperative. Safety maintained with 15 min checks.

## 2015-07-07 NOTE — Progress Notes (Signed)
Pleasant cooperative.  Denies SI/HI/AVH.  Denies any depression.   Verbalized that she was going home tomorrow and she was going to go fishing.  Support and encouragement offered.  Safety maintained.

## 2015-07-07 NOTE — Progress Notes (Signed)
Recreation Therapy Notes  Date: 02.20.17 Time: 3:00 pm Location: Community Room  Group Topic: Self-expression  Goal Area(s) Addresses:  Patient will be able to identify a color that represents each emotion. Patient will verbalize benefit of using art as a means of self-expression. Patient will verbalize one positive emotion experienced while participating in activity.  Behavioral Response: Attentive, Interactive  Intervention: The Colors Within Me  Activity: Patients were given a blank face worksheet and instructed to pick a color for each emotion they were experiencing and show on the worksheet how much of that emotion they were experiencing.  Education: LRT educated patients on different forms of self-expression.  Education Outcome: Acknowledges education/In group clarification offered  Clinical Observations/Feedback: Patient completed activity by picking colors for each emotion she was experiencing and showing on her worksheet how much of the emotions she was experiencing. Patient contributed to group discussion by stating what emotions she was feeling, how emotions affect her treatment in the hospital, that her emotions are dynamic, how she sees her emotions changing once she is able to d/c, and that it was helpful to see her emotions and why.  Jacquelynn Cree, LRT/CTRS 07/07/2015 4:18 PM

## 2015-07-07 NOTE — Plan of Care (Signed)
Problem: Alteration in mood; excessive anxiety as evidenced by: Goal: LTG-Patient's behavior demonstrates decreased anxiety (Patient's behavior demonstrates anxiety and he/she is utilizing learned coping skills to deal with anxiety-producing situations)  Outcome: Progressing Pleasant and cooperative.  Denies any anxiety.  Smiles upon approach.  Visible in milieu.  Interaction appropriately with peers.

## 2015-07-07 NOTE — Plan of Care (Signed)
Problem: Alteration in mood; excessive anxiety as evidenced by: Goal: STG-Pt will report an absence of self-harm thoughts/actions (Patient will report an absence of self-harm thoughts or actions)  Outcome: Progressing Patient denies SI at this time.     

## 2015-07-07 NOTE — [facility] Group Notes (Signed)
[facility] LCSW Aftercare Discharge Planning Group Note   07/07/2015 10:31 AM  Participation Quality:   Patient attended group and participated appropriately introducing herself and sharing her SMART goal is to "challenge myself daily, be honest about my feelings".   Mood/Affect:  Appropriate  Depression Rating:  0  Anxiety Rating:  0  Thoughts of Suicide:  No Will you contract for safety?   NA  Current AVH:  No  Plan for Discharge/Comments:  Discharge home with family and outpatient provider   Transportation Means: family will provide transportation  Supports: family  Lulu Riding, MSW, [facility] 07/07/2015

## 2015-07-07 NOTE — [facility] Group Notes (Signed)
[facility] LCSW Group Therapy  07/07/2015 2:06 PM  Type of Therapy:  Group Therapy  Participation Level:  Active  Participation Quality:  Appropriate and Attentive  Affect:  Appropriate  Cognitive:  Alert, Appropriate and Oriented  Insight:  Engaged  Engagement in Therapy:  Engaged  Modes of Intervention:  Discussion, Socialization and Support  Summary of Progress/Problems: Patient attended and participated in group discussion appropriately introducing herself and sharing her self-care activity is "hiking and trails" and patient reports she likes the mountains. Patient shared that an obstacle for her is finances and was able to see how transportation is an obstacle for others and can relate to these challenges in an "Overcoming Obstacles" group discussion.  Lulu Riding, MSW, LCSWA 07/07/2015, 2:06 PM

## 2015-07-08 ENCOUNTER — Ambulatory Visit: Payer: Commercial Managed Care - HMO | Admitting: Psychiatry

## 2015-07-08 LAB — GLUCOSE, CAPILLARY: GLUCOSE-CAPILLARY: 129 mg/dL — AB (ref 65–99)

## 2015-07-08 NOTE — Progress Notes (Signed)
  [facility] Adult Case Management Discharge Plan :  Will you be returning to the same living situation after discharge:  Yes,   with father At discharge, do you have transportation home?: Yes,  sister to pick up Do you have the ability to pay for your medications: Yes,     Release of information consent forms completed and in the chart;  Patient's signature needed at discharge.  Patient to Follow up at: Follow-up Information    Follow up with [facility], NP.   Specialty:  Nurse Practitioner   Contact information:   [address] [place] [address] [phone]       Go to [employer].   Why:  Hospital Follow up, Outpatient Medication Management, Therapy, Walk-ins Monday-Friday between 9am-4pm., Please take your photo ID and Insurance card   Contact information:   [address] [place] [address] [phone] FAX      Next level of care provider has access to [facility] Link:no  Safety Planning and Suicide Prevention discussed: Yes,     Have you used any form of tobacco in the last 30 days? (Cigarettes, Smokeless Tobacco, Cigars, and/or Pipes): Yes  Has patient been referred to the Quitline?: Patient refused referral  Patient has been referred for addiction treatment: N/A  Glennon Mac, MSW, LCSW 07/08/2015, 4:15 PM

## 2015-07-08 NOTE — Progress Notes (Signed)
Pleasant and cooperative.  Denies SI/HI/AVH. Discharge instructions given, verbalized understanding. Prescriptions given and personal belongings returned.  Escorted of unit by staff member to meet family to travel home.

## 2015-07-08 NOTE — [facility] Group Notes (Signed)
[facility] Group Notes:  (Nursing/MHT/Case Management/Adjunct)  Date:  07/08/2015  Time:  2:06 PM  Type of Therapy:  Psychoeducational Skills  Participation Level:  Did Not Attend   Julie Morrow Julie Morrow 07/08/2015, 2:06 PM 

## 2015-07-08 NOTE — [facility] Group Notes (Signed)
[facility] Group Notes:  (Nursing/MHT/Case Management/Adjunct)  Date:  07/08/2015  Time:  2:03 AM  Type of Therapy:  Group Therapy  Participation Level:  Active  Participation Quality:  Appropriate  Affect:  Appropriate  Cognitive:  Appropriate  Insight:  Appropriate  Engagement in Group:  Engaged  Modes of Intervention:  Discussion  Summary of Progress/Problems:  Burt Ek 07/08/2015, 2:03 AM

## 2015-07-08 NOTE — [facility] Group Notes (Signed)
[facility] LCSW Group Therapy  07/08/2015 2:22 PM  Type of Therapy:  Group Therapy  Participation Level:  Did Not Attend  Summary of Progress/Problems: Patient was called to group but did not attend.   Jibril Mcminn T, MSW, LCSWA 07/08/2015, 2:22 PM  

## 2015-10-10 ENCOUNTER — Emergency Department: Payer: Commercial Managed Care - HMO

## 2015-10-10 ENCOUNTER — Emergency Department
Admission: EM | Admit: 2015-10-10 | Discharge: 2015-10-11 | Disposition: A | Discharge status: Other Acute Inpatient Hospital | Payer: Commercial Managed Care - HMO | Attending: Emergency Medicine | Admitting: Emergency Medicine

## 2015-10-10 ENCOUNTER — Encounter: Payer: Self-pay | Admitting: Emergency Medicine

## 2015-10-10 DIAGNOSIS — Z7982 Long term (current) use of aspirin: Secondary | ICD-10-CM | POA: Diagnosis not present

## 2015-10-10 DIAGNOSIS — E039 Hypothyroidism, unspecified: Secondary | ICD-10-CM | POA: Insufficient documentation

## 2015-10-10 DIAGNOSIS — Z791 Long term (current) use of non-steroidal anti-inflammatories (NSAID): Secondary | ICD-10-CM | POA: Insufficient documentation

## 2015-10-10 DIAGNOSIS — F314 Bipolar disorder, current episode depressed, severe, without psychotic features: Secondary | ICD-10-CM | POA: Diagnosis not present

## 2015-10-10 DIAGNOSIS — Z79899 Other long term (current) drug therapy: Secondary | ICD-10-CM | POA: Diagnosis not present

## 2015-10-10 DIAGNOSIS — F329 Major depressive disorder, single episode, unspecified: Secondary | ICD-10-CM | POA: Insufficient documentation

## 2015-10-10 DIAGNOSIS — E785 Hyperlipidemia, unspecified: Secondary | ICD-10-CM | POA: Diagnosis not present

## 2015-10-10 DIAGNOSIS — F17210 Nicotine dependence, cigarettes, uncomplicated: Secondary | ICD-10-CM | POA: Insufficient documentation

## 2015-10-10 DIAGNOSIS — E119 Type 2 diabetes mellitus without complications: Secondary | ICD-10-CM | POA: Diagnosis not present

## 2015-10-10 DIAGNOSIS — F32A Depression, unspecified: Secondary | ICD-10-CM

## 2015-10-10 DIAGNOSIS — Z7984 Long term (current) use of oral hypoglycemic drugs: Secondary | ICD-10-CM | POA: Insufficient documentation

## 2015-10-10 HISTORY — DX: Bipolar disorder, unspecified: F31.9

## 2015-10-10 LAB — COMPREHENSIVE METABOLIC PANEL
ALT: 28 U/L (ref 14–54)
ANION GAP: 9 (ref 5–15)
AST: 36 U/L (ref 15–41)
Albumin: 4.5 g/dL (ref 3.5–5.0)
Alkaline Phosphatase: 131 U/L — ABNORMAL HIGH (ref 38–126)
BUN: 9 mg/dL (ref 6–20)
CHLORIDE: 102 mmol/L (ref 101–111)
CO2: 27 mmol/L (ref 22–32)
CREATININE: 0.9 mg/dL (ref 0.44–1.00)
Calcium: 9.3 mg/dL (ref 8.9–10.3)
Glucose, Bld: 112 mg/dL — ABNORMAL HIGH (ref 65–99)
POTASSIUM: 3.6 mmol/L (ref 3.5–5.1)
SODIUM: 138 mmol/L (ref 135–145)
Total Bilirubin: 0.5 mg/dL (ref 0.3–1.2)
Total Protein: 7.7 g/dL (ref 6.5–8.1)

## 2015-10-10 LAB — SALICYLATE LEVEL

## 2015-10-10 LAB — CBC
HCT: 45.3 % (ref 35.0–47.0)
HEMOGLOBIN: 14.7 g/dL (ref 12.0–16.0)
MCH: 29.9 pg (ref 26.0–34.0)
MCHC: 32.5 g/dL (ref 32.0–36.0)
MCV: 92.1 fL (ref 80.0–100.0)
PLATELETS: 207 10*3/uL (ref 150–440)
RBC: 4.92 MIL/uL (ref 3.80–5.20)
RDW: 16.9 % — ABNORMAL HIGH (ref 11.5–14.5)
WBC: 11.1 10*3/uL — AB (ref 3.6–11.0)

## 2015-10-10 LAB — TROPONIN I

## 2015-10-10 LAB — ACETAMINOPHEN LEVEL

## 2015-10-10 LAB — ETHANOL

## 2015-10-10 NOTE — BH Assessment (Signed)
Assessment Note  Julie Morrow is an 65 y.o. female presents to the ED with concerns of depression.  Pt denies any SI/HI and any auditory/visual hallucinations.  Pt states that she has been experiencing issues dealing with her "life situations" and came in to talk to someone.  [facility].  Diagnosis: Depression  Past Medical History:  Past Medical History  Diagnosis Date  . Hypothyroidism   . Depression   . Anxiety   . Diabetes mellitus, type II (HCC)     Patient takes Glucotrol and Januvia  . H/O diabetes mellitus 04/08/2015    borderline diabetes no medications needed.   . Bipolar 1 disorder [facility](HCC)     Past Surgical History  Procedure Laterality Date  . Abdominal hysterectomy      Family History:  Family History  Problem Relation Age of Onset  . Dementia Mother   . Arthritis Father     Social History:  reports that she has been smoking Cigarettes.  She started smoking about 30 years ago. She has a 1.25 pack-year smoking history. She has never used smokeless tobacco. She reports that she does not drink alcohol or use illicit drugs.  Additional Social History:  Alcohol / Drug Use History of alcohol / drug use?: No history of alcohol / drug abuse (Pt denes)  CIWA: CIWA-Ar BP: (!) 145/69 mmHg Pulse Rate: 64 COWS:    Allergies:  Allergies  Allergen Reactions  . Neomycin-Bacitracin Zn-Polymyx Rash    Home Medications:  (Not in a hospital admission)  OB/GYN Status:  No LMP recorded. Patient has had a hysterectomy.  General Assessment Data [facility] ED TTS Assessment: In system Is this a Tele or Face-to-Face Assessment?: Face-to-Face Is this an Initial Assessment or a Re-assessment for this encounter?: Initial Assessment Marital status: Single Maiden name: N/A Is patient pregnant?: No Pregnancy Status: No Living Arrangements: Non-relatives/Friends Can pt return to current living arrangement?: Yes Admission  Status: Voluntary Is patient capable of signing voluntary admission?: Yes Referral Source: Self/Family/Friend Insurance type: [employer] Medicare  Medical Screening Exam [facility] Walk-in ONLY) Medical Exam completed: Yes  Crisis Care Plan Living Arrangements: Non-relatives/Friends Legal Guardian: Other: (self) Name of Psychiatrist: None reported Name of Therapist: None reported  Education Status Is patient currently in school?: No Current Grade: N/A Highest grade of school patient has completed: N/A Name of school: N/a Contact person: N/A  Risk to self with the past 6 months Suicidal Ideation: No Has patient been a risk to self within the past 6 months prior to admission? : No Suicidal Intent: No Has patient had any suicidal intent within the past 6 months prior to admission? : No Is patient at risk for suicide?: No Suicidal Plan?: No Has patient had any suicidal plan within the past 6 months prior to admission? : No Access to Means: No What has been your use of drugs/alcohol within the last 12 months?: None reported Previous Attempts/Gestures: No How many times?: 0 Other Self Harm Risks: None identified Triggers for Past Attempts: None known Intentional Self Injurious Behavior: None Family Suicide History: No Recent stressful life event(s): Other (Comment) Persecutory voices/beliefs?: No Depression: Yes Depression Symptoms: Loss of interest in usual pleasures, Feeling worthless/self pity Substance abuse history and/or treatment for substance abuse?: No Suicide prevention information given to non-admitted patients: Not applicable  Risk to Others within the past 6 months Homicidal Ideation: No Does patient have any lifetime risk of violence toward others beyond the six months  prior to admission? : No Thoughts of Harm to Others: No Current Homicidal Intent: No Current Homicidal Plan: No Access to Homicidal Means: No Identified Victim: None identified History of harm to  others?: No Assessment of Violence: None Noted Violent Behavior Description: None identified Does patient have access to weapons?: No Criminal Charges Pending?: No Does patient have a court date: No Is patient on probation?: No  Psychosis Hallucinations: None noted Delusions: None noted  Mental Status Report Appearance/Hygiene: In scrubs Eye Contact: Good Motor Activity: Freedom of movement Speech: Logical/coherent Level of Consciousness: Alert Mood: Anxious, Depressed Affect: Anxious Anxiety Level: None Thought Processes: Coherent, Relevant Judgement: Unimpaired Orientation: Person, Place, Time, Situation Obsessive Compulsive Thoughts/Behaviors: None  Cognitive Functioning Concentration: Normal Memory: Recent Intact, Remote Intact IQ: Average Insight: Good Impulse Control: Good Appetite: Good Weight Loss: 0 Weight Gain: 0 Sleep: No Change Vegetative Symptoms: None  ADLScreening [facility] Assessment Services) Patient's cognitive ability adequate to safely complete daily activities?: Yes Patient able to express need for assistance with ADLs?: Yes Independently performs ADLs?: Yes (appropriate for developmental age)  Prior Inpatient Therapy Prior Inpatient Therapy: Yes Prior Therapy Facilty/Provider(s): [facility] Reason for Treatment: depression  Prior Outpatient Therapy Prior Outpatient Therapy: No Prior Therapy Dates: N/A Prior Therapy Facilty/Provider(s): N/A Reason for Treatment: N/A Does patient have an ACCT team?: No Does patient have Intensive In-House Services?  : No Does patient have [facility] services? : No Does patient have P4CC services?: No  ADL Screening (condition at time of admission) Patient's cognitive ability adequate to safely complete daily activities?: Yes Patient able to express need for assistance with ADLs?: Yes Independently performs ADLs?: Yes (appropriate for developmental age)       Abuse/Neglect Assessment (Assessment to be complete  while patient is alone) Physical Abuse: Denies Verbal Abuse: Denies Sexual Abuse: Denies Exploitation of patient/patient's resources: Denies Self-Neglect: Denies Values / Beliefs Cultural Requests During Hospitalization: None Spiritual Requests During Hospitalization: None Consults Spiritual Care Consult Needed: No Social Work Consult Needed: No Merchant navy officer (For Healthcare) Does patient have an advance directive?: No    Additional Information 1:1 In Past 12 Months?: No CIRT Risk: No Elopement Risk: No Does patient have medical clearance?: Yes     Disposition:  Disposition Initial Assessment Completed for this Encounter: Yes Disposition of Patient: Other dispositions Other disposition(s): Other (Comment) (Pending Psych MD consult)  On Site Evaluation by:   Reviewed with Physician:    [place] 10/10/2015 9:16 PM

## 2015-10-10 NOTE — ED Notes (Addendum)
Patient dressed into paper scrubs. One bag containing shorts, shoes, shirt, hat, sunglasses and jewelry and a [employer] cooler bag [facility] back [facility].

## 2015-10-10 NOTE — ED Provider Notes (Signed)
[facility] Emergency Department Provider Note   ____________________________________________  Time seen: Approximately 8 PM  I have reviewed the triage vital signs and the nursing notes.   HISTORY  Chief Complaint Depression   HPI Julie Morrow is a 65 y.o. female with a history of diabetes and depression who is presenting to the emergency department today with worsening depression over the course of one week. She denies any suicidal ideation. Does not know what worsened her depression over the past week. Says that she has been admitted before and had her medication adjusted and this helped with her psychiatric symptoms. Denies any hallucinations. Says that she also has shortness of breath with her depression which is worsened when she is feeling more depressed. Denies any exertional symptoms. Denies any chest pain.   Past Medical History  Diagnosis Date  . Hypothyroidism   . Depression   . Anxiety   . Diabetes mellitus, type II (HCC)     Patient takes Glucotrol and Januvia  . H/O diabetes mellitus 04/08/2015    borderline diabetes no medications needed.   . Bipolar 1 disorder [facility])     Patient Active Problem List   Diagnosis Date Noted  . Diabetes type 2, controlled (HCC) 07/07/2015  . Obesity 07/07/2015  . Tobacco use disorder 07/07/2015  . Hypothyroidism 07/07/2015  . Bipolar 1 disorder, manic, moderate (HCC) 07/01/2015  . Acid reflux 11/07/2014  . HLD (hyperlipidemia) 11/07/2014  . Psoriasis 11/07/2014    Past Surgical History  Procedure Laterality Date  . Abdominal hysterectomy      Current Outpatient Rx  Name  Route  Sig  Dispense  Refill  . ARIPiprazole (ABILIFY) 30 MG tablet   Oral   Take 1 tablet (30 mg total) by mouth daily.   30 tablet   0   . ARIPiprazole 400 MG SUSR   Intramuscular   Inject 400 mg into the muscle every 30 (thirty) days.   1 each   0     Next dose on 3/20   . aspirin 81 MG chewable tablet    Oral   Chew 81 mg by mouth daily.         . fenofibrate 160 MG tablet   Oral   Take 160 mg by mouth daily.         Marland Kitchen glipiZIDE (GLUCOTROL) 10 MG tablet   Oral   Take 10 mg by mouth 2 (two) times daily before a meal.          . levothyroxine (SYNTHROID, LEVOTHROID) 200 MCG tablet   Oral   Take 1 tablet (200 mcg total) by mouth daily before breakfast.   30 tablet   0   . rosuvastatin (CRESTOR) 20 MG tablet   Oral   Take 20 mg by mouth at bedtime.         . sitaGLIPtin (JANUVIA) 100 MG tablet   Oral   Take 100 mg by mouth daily.           Allergies Neomycin-bacitracin zn-polymyx  Family History  Problem Relation Age of Onset  . Dementia Mother   . Arthritis Father     Social History Social History  Substance Use Topics  . Smoking status: Current Every Day Smoker -- 0.25 packs/day for 5 years    Types: Cigarettes    Start date: 11/06/1984  . Smokeless tobacco: Never Used  . Alcohol Use: No    Review of Systems Constitutional: No fever/chills Eyes: No visual  changes. ENT: No sore throat. Cardiovascular: Denies chest pain. Respiratory: As above Gastrointestinal: No abdominal pain.  No nausea, no vomiting.  No diarrhea.  No constipation. Genitourinary: Negative for dysuria. Musculoskeletal: Negative for back pain. Skin: Negative for rash. Neurological: Negative for headaches, focal weakness or numbness.  10-point ROS otherwise negative.  ____________________________________________   PHYSICAL EXAM:  VITAL SIGNS: ED Triage Vitals  Enc Vitals Group     BP 10/10/15 1747 145/69 mmHg     Pulse Rate 10/10/15 1747 64     Resp 10/10/15 1747 18     Temp 10/10/15 1747 97.6 F (36.4 C)     Temp Source 10/10/15 1747 Oral     SpO2 10/10/15 1747 95 %     Weight 10/10/15 1747 270 lb (122.471 kg)     Height 10/10/15 1747 5\' 3"  (1.6 m)     Head Cir --      Peak Flow --      Pain Score 10/10/15 1748 0     Pain Loc --      Pain Edu? --      Excl. in  GC? --     Constitutional: Alert and oriented. Well appearing and in no acute distress. Eyes: Conjunctivae are normal. PERRL. EOMI. Head: Atraumatic. Nose: No congestion/rhinnorhea. Mouth/Throat: Mucous membranes are moist.   Neck: No stridor.   Cardiovascular: Normal rate, regular rhythm. Grossly normal heart sounds.  Respiratory: Normal respiratory effort.  No retractions. Lungs CTAB. Gastrointestinal: Soft and nontender. No distention. Musculoskeletal: No lower extremity tenderness nor edema.  No joint effusions. Neurologic:  Normal speech and language. No gross focal neurologic deficits are appreciated.  Skin:  Skin is warm, dry and intact. No rash noted. Psychiatric: Mood and affect are normal. Speech and behavior are normal.  ____________________________________________   LABS (all labs ordered are listed, but only abnormal results are displayed)  Labs Reviewed  COMPREHENSIVE METABOLIC PANEL - Abnormal; Notable for the following:    Glucose, Bld 112 (*)    Alkaline Phosphatase 131 (*)    All other components within normal limits  ACETAMINOPHEN LEVEL - Abnormal; Notable for the following:    Acetaminophen (Tylenol), Serum <10 (*)    All other components within normal limits  CBC - Abnormal; Notable for the following:    WBC 11.1 (*)    RDW 16.9 (*)    All other components within normal limits  ETHANOL  SALICYLATE LEVEL  URINE DRUG SCREEN, QUALITATIVE ([facility] ONLY)  TROPONIN I   ____________________________________________  EKG  ED ECG REPORT I, Arelia LongestSchaevitz,  Marvina Danner M, the attending physician, personally viewed and interpreted this ECG.   Date: 10/10/2015  EKG Time: 2126  Rate: 89  Rhythm: sinus bradycardia  Axis: Normal  Intervals:And complete right bundle-branch block  ST&T Change: No ST segment elevation or depression. Single T-wave inversion in aVL.  ____________________________________________  RADIOLOGY   DG Chest 2 View (Final result) Result time:  10/10/15 20:46:19   Final result by Rad Results In Interface (10/10/15 20:46:19)   Narrative:   CLINICAL DATA: Chronic cough and shortness of breath. Initial encounter.  EXAM: CHEST 2 VIEW  COMPARISON: Chest radiograph performed 02/25/2014  FINDINGS: The lungs are well-aerated. Mild vascular congestion is noted. There is no evidence of focal opacification, pleural effusion or pneumothorax.  The heart is normal in size; the mediastinal contour is within normal limits. No acute osseous abnormalities are seen.  IMPRESSION: Mild vascular congestion noted. Lungs remain grossly clear.   Electronically Signed By:  Roanna Raider M.D. On: 10/10/2015 20:46    ____________________________________________   PROCEDURES   ____________________________________________   INITIAL IMPRESSION / ASSESSMENT AND PLAN / ED COURSE  Pertinent labs & imaging results that were available during my care of the patient were reviewed by me and considered in my medical decision making (see chart for details).  Asian depressed but not suicidal. Will not involuntarily committed at this time. Patient knows that she will likely be waiting overnight for psychiatric consult. ____________________________________________   FINAL CLINICAL IMPRESSION(S) / ED DIAGNOSES  Depression. Shortness of breath. Suspect that the shortness of breath is likely psychosomatic. However, will check EKG as well as chest x-ray and troponin.    NEW MEDICATIONS STARTED DURING THIS VISIT:  New Prescriptions   No medications on file     Note:  This document was prepared using Dragon voice recognition software and may include unintentional dictation errors.    Myrna Blazer, MD 10/10/15 2136

## 2015-10-10 NOTE — ED Notes (Signed)
Feelings of depression x1 week , denies SI or HI, hx of depression with medication therapy

## 2015-10-11 ENCOUNTER — Inpatient Hospital Stay
Admission: RE | Admit: 2015-10-11 | Discharge: 2015-10-22 | DRG: 885 | Disposition: A | Discharge status: Home / Self Care | Payer: Commercial Managed Care - HMO | Source: Intra-hospital | Attending: Psychiatry | Admitting: Psychiatry

## 2015-10-11 ENCOUNTER — Encounter: Payer: Self-pay | Admitting: General Practice

## 2015-10-11 DIAGNOSIS — Z79899 Other long term (current) drug therapy: Secondary | ICD-10-CM

## 2015-10-11 DIAGNOSIS — K219 Gastro-esophageal reflux disease without esophagitis: Secondary | ICD-10-CM | POA: Diagnosis present

## 2015-10-11 DIAGNOSIS — Z9119 Patient's noncompliance with other medical treatment and regimen: Secondary | ICD-10-CM | POA: Diagnosis not present

## 2015-10-11 DIAGNOSIS — Z8261 Family history of arthritis: Secondary | ICD-10-CM

## 2015-10-11 DIAGNOSIS — E039 Hypothyroidism, unspecified: Secondary | ICD-10-CM | POA: Diagnosis present

## 2015-10-11 DIAGNOSIS — Z7984 Long term (current) use of oral hypoglycemic drugs: Secondary | ICD-10-CM

## 2015-10-11 DIAGNOSIS — F3163 Bipolar disorder, current episode mixed, severe, without psychotic features: Secondary | ICD-10-CM | POA: Diagnosis present

## 2015-10-11 DIAGNOSIS — G4700 Insomnia, unspecified: Secondary | ICD-10-CM | POA: Diagnosis present

## 2015-10-11 DIAGNOSIS — E119 Type 2 diabetes mellitus without complications: Secondary | ICD-10-CM | POA: Diagnosis present

## 2015-10-11 DIAGNOSIS — E785 Hyperlipidemia, unspecified: Secondary | ICD-10-CM | POA: Diagnosis present

## 2015-10-11 DIAGNOSIS — Z7982 Long term (current) use of aspirin: Secondary | ICD-10-CM

## 2015-10-11 DIAGNOSIS — F314 Bipolar disorder, current episode depressed, severe, without psychotic features: Secondary | ICD-10-CM | POA: Diagnosis not present

## 2015-10-11 DIAGNOSIS — Z6841 Body Mass Index (BMI) 40.0 and over, adult: Secondary | ICD-10-CM

## 2015-10-11 DIAGNOSIS — F17200 Nicotine dependence, unspecified, uncomplicated: Secondary | ICD-10-CM | POA: Diagnosis present

## 2015-10-11 DIAGNOSIS — F329 Major depressive disorder, single episode, unspecified: Secondary | ICD-10-CM | POA: Diagnosis not present

## 2015-10-11 DIAGNOSIS — F419 Anxiety disorder, unspecified: Secondary | ICD-10-CM | POA: Diagnosis present

## 2015-10-11 DIAGNOSIS — L409 Psoriasis, unspecified: Secondary | ICD-10-CM | POA: Diagnosis present

## 2015-10-11 DIAGNOSIS — F17210 Nicotine dependence, cigarettes, uncomplicated: Secondary | ICD-10-CM | POA: Diagnosis present

## 2015-10-11 DIAGNOSIS — E669 Obesity, unspecified: Secondary | ICD-10-CM

## 2015-10-11 LAB — GLUCOSE, CAPILLARY: Glucose-Capillary: 158 mg/dL — ABNORMAL HIGH (ref 65–99)

## 2015-10-11 LAB — URINE DRUG SCREEN, QUALITATIVE (ARMC ONLY)
Amphetamines, Ur Screen: NOT DETECTED
BARBITURATES, UR SCREEN: NOT DETECTED
Benzodiazepine, Ur Scrn: NOT DETECTED
COCAINE METABOLITE, UR ~~LOC~~: NOT DETECTED
Cannabinoid 50 Ng, Ur ~~LOC~~: NOT DETECTED
MDMA (ECSTASY) UR SCREEN: NOT DETECTED
METHADONE SCREEN, URINE: NOT DETECTED
OPIATE, UR SCREEN: NOT DETECTED
Phencyclidine (PCP) Ur S: NOT DETECTED
TRICYCLIC, UR SCREEN: NOT DETECTED

## 2015-10-11 MED ORDER — ASPIRIN 81 MG PO CHEW
81.0000 mg | CHEWABLE_TABLET | Freq: Every day | ORAL | Status: DC
  Administered 2015-10-11: 81 mg via ORAL
  Filled 2015-10-11: qty 1

## 2015-10-11 MED ORDER — ARIPIPRAZOLE 15 MG PO TABS
30.0000 mg | ORAL_TABLET | Freq: Every day | ORAL | Status: DC
  Administered 2015-10-11: 30 mg via ORAL
  Filled 2015-10-11: qty 2

## 2015-10-11 MED ORDER — BUPROPION HCL ER (SR) 150 MG PO TB12
150.0000 mg | ORAL_TABLET | Freq: Two times a day (BID) | ORAL | Status: DC
  Administered 2015-10-11: 150 mg via ORAL
  Filled 2015-10-11 (×3): qty 1

## 2015-10-11 MED ORDER — LORAZEPAM 1 MG PO TABS
ORAL_TABLET | ORAL | Status: AC
  Filled 2015-10-11: qty 1

## 2015-10-11 MED ORDER — ARIPIPRAZOLE 10 MG PO TABS
30.0000 mg | ORAL_TABLET | Freq: Every day | ORAL | Status: DC
  Filled 2015-10-11: qty 2

## 2015-10-11 MED ORDER — ALUM & MAG HYDROXIDE-SIMETH 200-200-20 MG/5ML PO SUSP: 30.0000 mL | ORAL | Status: DC | PRN

## 2015-10-11 MED ORDER — MAGNESIUM HYDROXIDE 400 MG/5ML PO SUSP: 30.0000 mL | Freq: Every day | ORAL | Status: DC | PRN

## 2015-10-11 MED ORDER — GLIPIZIDE 10 MG PO TABS
10.0000 mg | ORAL_TABLET | Freq: Two times a day (BID) | ORAL | Status: DC
  Administered 2015-10-11 – 2015-10-22 (×20): 10 mg via ORAL
  Filled 2015-10-11 (×26): qty 1

## 2015-10-11 MED ORDER — LEVOTHYROXINE SODIUM 25 MCG PO TABS
50.0000 ug | ORAL_TABLET | Freq: Every day | ORAL | Status: DC
  Administered 2015-10-12 – 2015-10-14 (×3): 50 ug via ORAL
  Filled 2015-10-11 (×4): qty 2

## 2015-10-11 MED ORDER — LORAZEPAM 1 MG PO TABS
1.0000 mg | ORAL_TABLET | Freq: Once | ORAL | Status: AC
  Administered 2015-10-11: 1 mg via ORAL

## 2015-10-11 MED ORDER — BUPROPION HCL ER (SR) 150 MG PO TB12
150.0000 mg | ORAL_TABLET | Freq: Two times a day (BID) | ORAL | Status: DC
  Administered 2015-10-11 – 2015-10-12 (×2): 150 mg via ORAL
  Filled 2015-10-11 (×3): qty 1

## 2015-10-11 MED ORDER — LEVOTHYROXINE SODIUM 25 MCG PO TABS
50.0000 ug | ORAL_TABLET | Freq: Every morning | ORAL | Status: DC
  Administered 2015-10-11: 50 ug via ORAL
  Filled 2015-10-11: qty 1

## 2015-10-11 MED ORDER — ASPIRIN 81 MG PO CHEW
81.0000 mg | CHEWABLE_TABLET | Freq: Every day | ORAL | Status: DC
  Administered 2015-10-12 – 2015-10-15 (×4): 81 mg via ORAL
  Filled 2015-10-11 (×4): qty 1

## 2015-10-11 MED ORDER — GLIPIZIDE 10 MG PO TABS
10.0000 mg | ORAL_TABLET | Freq: Two times a day (BID) | ORAL | Status: DC
  Administered 2015-10-11: 10 mg via ORAL
  Filled 2015-10-11 (×3): qty 1

## 2015-10-11 MED ORDER — ACETAMINOPHEN 325 MG PO TABS
650.0000 mg | ORAL_TABLET | Freq: Four times a day (QID) | ORAL | Status: DC | PRN
  Administered 2015-10-12: 650 mg via ORAL
  Filled 2015-10-11 (×2): qty 2

## 2015-10-11 NOTE — ED Notes (Signed)
Patient ask for medication, states that she is having racing thoughts and needs medication, Nurse called ED Doctor and received order for ativan once.

## 2015-10-11 NOTE — ED Notes (Signed)
Pt states "I've been waiting for meds all night to help me sleep" pt told she was observed sleeping and heard snoring.  Pt states though eyes were closed she was not sleeping, pt reminded that it's important for ED to verify meds prior to distributing meds, pt reports "this is the worse I've ever been treated" pt given water as requested after asking pt to provide urine sample

## 2015-10-11 NOTE — BHH Counselor (Signed)
Adult Comprehensive Assessment  Patient ID: Julie Morrow, female DOB: 1951-03-24, 65 y.o. MRN: [id number]  Information Source: Information source: Patient  Current Stressors:  Educational / Learning stressors: N/A Pt reports she has no stressors Employment / Job issues: N/A Family Relationships: N/A Surveyor, quantityinancial / Lack of resources (include bankruptcy): N/A Housing / Lack of housing: N/A Physical health (include injuries & life threatening diseases): N/A Social relationships: N/A Substance abuse: N/A Bereavement / Loss: N/A Pt reports she has no stressors  Living/Environment/Situation:  Living Arrangements: Non-relatives/Friends Living conditions (as described by patient or guardian): Pt was told she couldn't return to live at her friends house How long has patient lived in current situation?: Four months What is atmosphere in current home: Loving, Supportive, Comfortable  Family History:  Marital status: Single Does patient have children?: Yes How many children?: 2 How is patient's relationship with their children?: Pt reports she doesn't have a good relationship with her son and daughter  Childhood History:  By whom was/is the patient raised?: Both parents Additional childhood history information: Pt's father was an alcoholic and the pt was caught up in the conflict between family members at a young age Description of patient's relationship with caregiver when they were a child: Pt did not get along with her father but got along with her mother Patient's description of current relationship with people who raised him/her: Good relationship Does patient have siblings?: Yes Number of Siblings: 5 Description of patient's current relationship with siblings: Good relationship with all siblings Did patient suffer any verbal/emotional/physical/sexual abuse as a child?: Yes (Emotional abuse) Did patient suffer from severe childhood neglect?: Yes Patient description of severe  childhood neglect: Pt's father was emotionally distant Has patient ever been sexually abused/assaulted/raped as an adolescent or adult?: No Was the patient ever a victim of a crime or a disaster?: No Witnessed domestic violence?: No Has patient been effected by domestic violence as an adult?: No  Education:  Highest grade of school patient has completed: 12th grade and graduated from a [employer] Learning disability?: No  Employment/Work Situation:  Employment situation: On disability Why is patient on disability: Pt reports she is bi-polar How long has patient been on disability: 7-8 months What is the longest time patient has a held a job?: Ten Years Where was the patient employed at that time?: Scientist, 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[employer] (Later became [employer]) Has patient ever been in the [employer] and Companymilitary?: No Has patient ever served in Buyer, retailcombat?: No  Financial Resources:  Surveyor, quantityinancial resources: Insurance claims handlereceives SSDI Does patient have a Lawyerrepresentative payee or guardian?: No  Alcohol/Substance Abuse:  What has been your use of drugs/alcohol within the last 12 months?: Pt denies the use of alcohol or any other substances If attempted suicide, did drugs/alcohol play a role in this?: No Alcohol/Substance Abuse Treatment Hx: Denies past history Has alcohol/substance abuse ever caused legal problems?: No  Social Support System:  Patient's Community Support System: Good Describe Community Support System: Pt's sisters and friends Type of faith/religion: 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 How does patient's faith help to cope with current illness?: Pt reports she prays every morning  Leisure/Recreation:  Leisure and Hobbies: Pt enjoys excercising, walking now and running previously  Strengths/Needs:  What things does the patient do well?: Everything the pt tries she succeeds at In what areas does patient struggle / problems for patient: Pt denies any problem areas  Discharge Plan:  Does patient have access to transportation?:  Yes (Pt will [facility] home [facility]) Will patient  be returning to same living situation after discharge?: No Plan for living situation after discharge: Pt has no place to live after she is discharged, as of yet Currently receiving community mental health services: No If no, would patient like referral for services when discharged?: No Does patient have financial barriers related to discharge medications?: No  Summary/Recommendations:   Patient is a 65 year old female admitted  with a diagnosis of Bipolar 1 disorder. Patient presented to the hospital with depression, inability to sleep and racing thoughts. Patient will benefit from crisis stabilization, medication evaluation, group therapy and psycho education in addition to case management for discharge. At discharge, it is recommended that patient remain compliant with established discharge plan and continued treatment.  Rondall Allegra MSW, [place]  10/12/2015

## 2015-10-11 NOTE — Tx Team (Signed)
Initial Interdisciplinary Treatment Plan   PATIENT STRESSORS: Medication change or noncompliance   PATIENT STRENGTHS: General fund of knowledge Supportive family/friends   PROBLEM LIST: Problem List/Patient Goals Date to be addressed Date deferred Reason deferred Estimated date of resolution  depression      Suicide risk                                                 DISCHARGE CRITERIA:  Safe-care adequate arrangements made Verbal commitment to aftercare and medication compliance  PRELIMINARY DISCHARGE PLAN: Return to previous living arrangement  PATIENT/FAMIILY INVOLVEMENT: This treatment plan has been presented to and reviewed with the patient, Julie Morrow. The patient and family have been given the opportunity to ask questions and make suggestions.  [name] 10/11/2015, 4:13 PM

## 2015-10-11 NOTE — BHH Counselor (Signed)
Per. Dr. Garnetta BuddyFaheem, [facility].   Bed Assignment: 320-A  TTS Informed: French Anaracy (ED Secretary), Dr. Lenard LancePaduchowski (ED M.D.), Toniann FailWendy, RN [facility](BHU Nurse), Mayra Neerliff Parker, RN [facility](BMU Nurse), and Registration.

## 2015-10-11 NOTE — ED Notes (Signed)
Patient is watching tv, no evidence of distress, Patient is safe.

## 2015-10-11 NOTE — ED Notes (Signed)
[facility], Patient's belongings taken with her and all of her medications accounted for and taken to pharmacy to be held. Patient without s/s of distress.

## 2015-10-11 NOTE — ED Notes (Signed)
Report to Toniann FailWendy, [facility]. PT voluntary and to move to room 5. Pt ate all of her breakfast trap and 8oz of grape juice, 4oz apple juice.

## 2015-10-11 NOTE — ED Notes (Signed)
Patient is alert and oriented, patient is calm, states that she had started getting depressed about 1 week ago, she says she knows of no triggers, Patient was staying with her daughter at the time, patient denies Si/hi, and states she just has not had a appetite, and does not want to get up in the mornings, Patient states she does have supportive family and that her grandchildren cheer her. Camera monitoring surveillance in progress and q 15 min checks.

## 2015-10-11 NOTE — Consult Note (Signed)
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 Diabetes type 2, controlled ([place]) [E11.9] 07/07/2015  . Obesity [E66.9] 07/07/2015  . Tobacco use disorder [F17.200] 07/07/2015  . Hypothyroidism [E03.9] 07/07/2015  . Bipolar 1 disorder, manic, moderate ([place]) [F31.12] 07/01/2015  . Acid reflux [K21.9] 11/07/2014  . HLD (hyperlipidemia) [E78.5] 11/07/2014  . Psoriasis [L40.9] 11/07/2014    Total Time spent with patient: 1 hour  Subjective:   Julie Morrow is a 65 y.o. female patient admitted with worsening of her depression and not eating and sleeping well.  HPI:   Patient is a 65 year old morbidly obese female who was brought into the emergency room by her family members. She reported that she has recently been discharged from the inpatient behavioral health unit couple of months ago and she did well for a few weeks after her discharge. As she was compliant of her medications. She reported that she was following with Delia Chimes and was getting her medications. She reported that now she has been feeling depressed, she cannot eat and sleep. She has racing thoughts most of the time. She reported that she is unable to do anything. She is laying in bed most of the time. She appeared disheveled and depressed during the interview. She reported that she occasionally has auditory hallucinations she is unable to contract for safety at this time. She reported that she still lives with her dad and he is doing terrible. Patient reported that she is taking the Abilify oral medications. Patient has worsening of her depression and reported that she has been experiencing issues to deal with her life situations.    Past Psychiatric History: She reports 3  prior hospitalizations for anxiety and depression. She was recently discharged from the inpatient behavioral health unit. She was supposed to follow up with Jeanie Sewer. She was discharged on Abilify injection but she did not continue with the same and was just taking oral medications. She reported that the injection was helpful initially. She is currently not taking the medication on a consistent basis. . Patient denies any history of suicidal attempt  Risk to Self: Suicidal Ideation: No Suicidal Intent: No Is patient at risk for suicide?: No Suicidal Plan?: No Access to Means: No What has been your use of drugs/alcohol within the last 12 months?: None reported How many times?: 0 Other Self Harm Risks: None identified Triggers for Past Attempts: None known Intentional Self Injurious Behavior: None Risk to Others: Homicidal Ideation: No Thoughts of Harm to Others: No Current Homicidal Intent: No Current Homicidal Plan: No Access to Homicidal Means: No Identified Victim: None identified History of harm to others?: No Assessment of Violence: None Noted Violent Behavior Description: None identified Does patient have access to weapons?: No Criminal Charges Pending?: No Does patient have a court date: No Prior Inpatient Therapy: Prior Inpatient Therapy: Yes Prior Therapy Facilty[facility]Provider[facility]): [facility] Reason for Treatment: depression Prior Outpatient Therapy: Prior Outpatient Therapy: No Prior Therapy Dates: N/A Prior Therapy Facilty/Provider(s): N/A Reason for Treatment: N/A Does patient have an ACCT team?: No Does patient have Intensive In-House Services?  : No Does patient have [facility] services? : No Does patient have P4CC services?: No  Past Medical History:  Past Medical History  Diagnosis Date  . Hypothyroidism   . Depression   .  Anxiety   . Diabetes mellitus, type II ([place])     Patient takes Glucotrol and Januvia  . H/O diabetes mellitus 04/08/2015    borderline diabetes no  medications needed.   . Bipolar 1 disorder [facility])     Past Surgical History  Procedure Laterality Date  . Abdominal hysterectomy     Family History:  Family History  Problem Relation Age of Onset  . Dementia Mother   . Arthritis Father    Family Psychiatric  History: History of depression and dementia and her family. Social History:  History  Alcohol Use No     History  Drug Use No    Social History   Social History  . Marital Status: Single    Spouse Name: N/A  . Number of Children: N/A  . Years of Education: N/A   Social History Main Topics  . Smoking status: Current Every Day Smoker -- 0.25 packs/day for 5 years    Types: Cigarettes    Start date: 11/06/1984  . Smokeless tobacco: Never Used  . Alcohol Use: No  . Drug Use: No  . Sexual Activity: No   Other Topics Concern  . None   Social History Narrative   Additional Social History:  she currently lives with her father.  Allergies:   Allergies  Allergen Reactions  . Neomycin-Bacitracin Zn-Polymyx Rash    Labs:  Results for orders placed or performed during the hospital encounter of 10/10/15 (from the past 48 hour(s))  Urine Drug Screen, Qualitative     Status: None   Collection Time: 10/10/15  5:52 PM  Result Value Ref Range   Tricyclic, Ur Screen NONE DETECTED NONE DETECTED   Amphetamines, Ur Screen NONE DETECTED NONE DETECTED   MDMA (Ecstasy)Ur Screen NONE DETECTED NONE DETECTED   Cocaine Metabolite,Ur [place] NONE DETECTED NONE DETECTED   Opiate, Ur Screen NONE DETECTED NONE DETECTED   Phencyclidine (PCP) Ur S NONE DETECTED NONE DETECTED   Cannabinoid 50 Ng, Ur [place] NONE DETECTED NONE DETECTED   Barbiturates, Ur Screen NONE DETECTED NONE DETECTED   Benzodiazepine, Ur Scrn NONE DETECTED NONE DETECTED   Methadone Scn, Ur NONE DETECTED NONE DETECTED    Comment: (NOTE) 686  Tricyclics, urine               Cutoff 1000 ng/mL 200  Amphetamines, urine             Cutoff 1000 ng/mL 300  MDMA (Ecstasy), urine            Cutoff 500 ng/mL 400  Cocaine Metabolite, urine       Cutoff 300 ng/mL 500  Opiate, urine                   Cutoff 300 ng/mL 600  Phencyclidine (PCP), urine      Cutoff 25 ng/mL 700  Cannabinoid, urine              Cutoff 50 ng/mL 800  Barbiturates, urine             Cutoff 200 ng/mL 900  Benzodiazepine, urine           Cutoff 200 ng/mL 1000 Methadone, urine                Cutoff 300 ng/mL 1100 1200 The urine drug screen provides only a preliminary, unconfirmed 1300 analytical test result and should not be used for non-medical 1400 purposes. Clinical consideration and professional judgment should 1500 be applied to any  positive drug screen result due to possible 1600 interfering substances. A more specific alternate chemical method 1700 must be used in order to obtain a confirmed analytical result.  1800 Gas chromato graphy / mass spectrometry (GC/MS) is the preferred 1900 confirmatory method.   Comprehensive metabolic panel     Status: Abnormal   Collection Time: 10/10/15  6:05 PM  Result Value Ref Range   Sodium 138 135 - 145 mmol/L   Potassium 3.6 3.5 - 5.1 mmol/L   Chloride 102 101 - 111 mmol/L   CO2 27 22 - 32 mmol/L   Glucose, Bld 112 (H) 65 - 99 mg/dL   BUN 9 6 - 20 mg/dL   Creatinine, Ser 0.90 0.44 - 1.00 mg/dL   Calcium 9.3 8.9 - 10.3 mg/dL   Total Protein 7.7 6.5 - 8.1 g/dL   Albumin 4.5 3.5 - 5.0 g/dL   AST 36 15 - 41 U/L   ALT 28 14 - 54 U/L   Alkaline Phosphatase 131 (H) 38 - 126 U/L   Total Bilirubin 0.5 0.3 - 1.2 mg/dL   GFR calc non Af Amer >60 >60 mL/min   GFR calc Af Amer >60 >60 mL/min    Comment: (NOTE) The eGFR has been calculated using the CKD EPI equation. This calculation has not been validated in all clinical situations. eGFR's persistently <60 mL/min signify possible Chronic Kidney Disease.    Anion gap 9 5 - 15  Ethanol     Status: None   Collection Time: 10/10/15  6:05 PM  Result Value Ref Range   Alcohol, Ethyl (B) <5 <5 mg/dL     Comment:        LOWEST DETECTABLE LIMIT FOR SERUM ALCOHOL IS 5 mg/dL FOR MEDICAL PURPOSES ONLY   Salicylate level     Status: None   Collection Time: 10/10/15  6:05 PM  Result Value Ref Range   Salicylate Lvl <6.7 2.8 - 30.0 mg/dL  Acetaminophen level     Status: Abnormal   Collection Time: 10/10/15  6:05 PM  Result Value Ref Range   Acetaminophen (Tylenol), Serum <10 (L) 10 - 30 ug/mL    Comment:        THERAPEUTIC CONCENTRATIONS VARY SIGNIFICANTLY. A RANGE OF 10-30 ug/mL MAY BE AN EFFECTIVE CONCENTRATION FOR MANY PATIENTS. HOWEVER, SOME ARE BEST TREATED AT CONCENTRATIONS OUTSIDE THIS RANGE. ACETAMINOPHEN CONCENTRATIONS >150 ug/mL AT 4 HOURS AFTER INGESTION AND >50 ug/mL AT 12 HOURS AFTER INGESTION ARE OFTEN ASSOCIATED WITH TOXIC REACTIONS.   cbc     Status: Abnormal   Collection Time: 10/10/15  6:05 PM  Result Value Ref Range   WBC 11.1 (H) 3.6 - 11.0 K/uL   RBC 4.92 3.80 - 5.20 MIL/uL   Hemoglobin 14.7 12.0 - 16.0 g/dL   HCT 45.3 35.0 - 47.0 %   MCV 92.1 80.0 - 100.0 fL   MCH 29.9 26.0 - 34.0 pg   MCHC 32.5 32.0 - 36.0 g/dL   RDW 16.9 (H) 11.5 - 14.5 %   Platelets 207 150 - 440 K/uL  Troponin I     Status: None   Collection Time: 10/10/15  6:05 PM  Result Value Ref Range   Troponin I <0.03 <0.031 ng/mL    Comment:        NO INDICATION OF MYOCARDIAL INJURY.   Glucose, capillary     Status: Abnormal   Collection Time: 10/11/15  8:25 AM  Result Value Ref Range   Glucose-Capillary 158 (H) 65 - 99  mg/dL    Current Facility-Administered Medications  Medication Dose Route Frequency Provider Last Rate Last Dose  . ARIPiprazole (ABILIFY) tablet 30 mg  30 mg Oral QHS Harvest Dark, MD      . aspirin chewable tablet 81 mg  81 mg Oral Daily Harvest Dark, MD   81 mg at 10/11/15 0827  . buPROPion [facility] SR) 12 hr tablet 150 mg  150 mg Oral BID Harvest Dark, MD   150 mg at 10/11/15 0834  . glipiZIDE (GLUCOTROL) tablet 10 mg  10 mg Oral BID AC Harvest Dark, MD   10 mg at 10/11/15 0826  . levothyroxine (SYNTHROID, LEVOTHROID) tablet 50 mcg  50 mcg Oral q morning - 10a Harvest Dark, MD   50 mcg at 10/11/15 0827   Current Outpatient Prescriptions  Medication Sig Dispense Refill  . ARIPiprazole (ABILIFY) 30 MG tablet Take 1 tablet (30 mg total) by mouth daily. (Patient taking differently: Take 30 mg by mouth at bedtime. ) 30 tablet 0  . ARIPiprazole 400 MG SUSR Inject 400 mg into the muscle every 30 (thirty) days. 1 each 0  . aspirin 81 MG chewable tablet Chew 81 mg by mouth daily.    Marland Kitchen buPROPion (WELLBUTRIN SR) 150 MG 12 hr tablet Take 150 mg by mouth 2 (two) times daily.    . furosemide (LASIX) 20 MG tablet Take 20 mg by mouth daily as needed.    Marland Kitchen glipiZIDE (GLUCOTROL) 10 MG tablet Take 10 mg by mouth 2 (two) times daily before a meal.     . levothyroxine (SYNTHROID, LEVOTHROID) 50 MCG tablet Take 50 mcg by mouth every morning.      Musculoskeletal: Strength & Muscle Tone: decreased Gait & Station: normal Patient leans: N/A  Psychiatric Specialty Exam: Physical Exam  Review of Systems  Psychiatric/Behavioral: Positive for depression. The patient is nervous/anxious and has insomnia.     Blood pressure 117/58, pulse 62, temperature 98.4 F (36.9 C), temperature source Oral, resp. rate 18, height _0  (1.6 m), weight 270 lb (122.471 kg), SpO2 93 %.Body mass index is 47.84 kg/(m^2).  General Appearance: Casual and Disheveled  Eye Contact:  Poor  Speech:  Slow  Volume:  Decreased  Mood:  Depressed and Dysphoric  Affect:  Constricted and Depressed  Thought Process:  Goal Directed  Orientation:  Full (Time, Place, and Person)  Thought Content:  WDL  Suicidal Thoughts:  No  Homicidal Thoughts:  No  Memory:  Immediate;   Fair Recent;   Fair Remote;   Fair  Judgement:  Fair  Insight:  Fair  Psychomotor Activity:  Psychomotor Retardation  Concentration:  Concentration: Poor and Attention Span: Poor  Recall:  Poor   Fund of Knowledge:  Poor  Language:  Fair  Akathisia:  No  Handed:  Right  AIMS (if indicated):     Assets:  Communication Skills Desire for Improvement Social Support  ADL's:  Intact  Cognition:  WNL  Sleep:        Treatment Plan Summary: Daily contact with patient to assess and evaluate symptoms and progress in treatment and Medication management  Disposition: Recommend psychiatric Inpatient admission when medically cleared.   Patient will be admitted to the inpatient behavioral health unit for stabilization and safety She will continue on her medications as prescribed Treatment team to follow Thank you for allowing me to participate in the care of this patient    Rainey Pines, MD 10/11/2015 12:45 PM

## 2015-10-11 NOTE — Progress Notes (Signed)
Pt admitted to the Behavior Medicine unit with the Dx of Bipolar Disorder. According to report: Patient is a 65 year old morbidly obese female who was brought into the emergency room by her family members. She reported that she has recently been discharged from the inpatient behavioral health unit couple of months ago and she did well for a few weeks after her discharge. As she was compliant of her medications. She reported that she was following with Tomi BambergerSusan Fuller and was getting her medications. She reported that now she has been feeling depressed, she cannot eat and sleep. She has racing thoughts most of the time. She reported that she is unable to do anything. She reported that she occasionally has auditory hallucinations. Pt talked about the events leading to hospitalization. Pt denies SI at present and is able to contract for safety. Pt's mood and affect has been depressed. Pt strip searched by female staff, no contraband found. Skin assessment done as well. Will continue to monitor and maintain a safe environment.

## 2015-10-11 NOTE — ED Notes (Addendum)
Pt ambulated by this RN to [place] [facility] with no acute events. Pt alert and oriented X4, active, cooperative, pt in NAD. RR even and unlabored, color WNL.    Abigail Butts, RN and Animal nutritionist met this RN and patient upon arrival.

## 2015-10-11 NOTE — ED Provider Notes (Signed)
-----------------------------------------   1:48 PM on 10/11/2015 -----------------------------------------  The patient has been seen and evaluated by psychiatry, they believe the patient is in need of further care and will be admitting to their service for further treatment.  Minna AntisKevin Tjuana Vickrey, MD 10/11/15 1348

## 2015-10-11 NOTE — ED Notes (Signed)
Nurse [facility] , Patient transferring to lower level

## 2015-10-12 DIAGNOSIS — F3163 Bipolar disorder, current episode mixed, severe, without psychotic features: Principal | ICD-10-CM

## 2015-10-12 LAB — TSH: TSH: >90 u[IU]/mL — ABNORMAL HIGH (ref 0.350–4.500)

## 2015-10-12 LAB — GLUCOSE, CAPILLARY
Glucose-Capillary: 187 mg/dL — ABNORMAL HIGH (ref 65–99)
Glucose-Capillary: 63 mg/dL — ABNORMAL LOW (ref 65–99)

## 2015-10-12 MED ORDER — QUETIAPINE FUMARATE 25 MG PO TABS
50.0000 mg | ORAL_TABLET | Freq: Every day | ORAL | Status: DC
  Administered 2015-10-12: 50 mg via ORAL
  Filled 2015-10-12: qty 2

## 2015-10-12 MED ORDER — BUPROPION HCL ER (SR) 100 MG PO TB12
100.0000 mg | ORAL_TABLET | Freq: Two times a day (BID) | ORAL | Status: DC
  Filled 2015-10-12: qty 1

## 2015-10-12 MED ORDER — ARIPIPRAZOLE 15 MG PO TABS
30.0000 mg | ORAL_TABLET | Freq: Every day | ORAL | Status: DC
  Administered 2015-10-12 – 2015-10-15 (×4): 30 mg via ORAL
  Filled 2015-10-12 (×4): qty 2

## 2015-10-12 MED ORDER — QUETIAPINE FUMARATE 100 MG PO TABS
100.0000 mg | ORAL_TABLET | Freq: Every day | ORAL | Status: DC
  Administered 2015-10-13: 100 mg via ORAL
  Filled 2015-10-12: qty 1

## 2015-10-12 MED ORDER — ZOLPIDEM TARTRATE 5 MG PO TABS
5.0000 mg | ORAL_TABLET | Freq: Every evening | ORAL | Status: DC | PRN
  Administered 2015-10-12 – 2015-10-13 (×2): 5 mg via ORAL
  Filled 2015-10-12 (×2): qty 1

## 2015-10-12 NOTE — BHH Group Notes (Signed)
BHH Group Notes:  (Nursing/MHT/Case Management/Adjunct)  Date:  10/12/2015  Time:  1:39 AM  Type of Therapy:  Group Therapy  Participation Level:  Did Not Attend   Summary of Progress/Problems:  Veva Holesshley Imani Kacyn Souder 10/12/2015, 1:39 AM

## 2015-10-12 NOTE — Progress Notes (Signed)
D: Pt denies SI/HI/AVH. Pt is pleasant and cooperative, affect is flat and sad but brightens upon approach. Pt appears anxious and she is not interacting with peers and staff appropriately.  A: Pt was offered support and encouragement. Pt was given scheduled medications. Pt was encouraged to attend groups. Q 15 minute checks were done for safety.  R:Pt did not attend group. Pt is taking medication. Pt has no complaints.Pt receptive to treatment and safety maintained on unit.

## 2015-10-12 NOTE — BHH Group Notes (Signed)
BHH LCSW Group Therapy  10/12/2015 2:05 PM  Type of Therapy:  Group Therapy  Participation Level:  Did Not Attend  Modes of Intervention:  Discussion, Education, Socialization and Support  Summary of Progress/Problems: Pt will identify unhealthy thoughts and how they impact their emotions and behavior. Pt will be encouraged to discuss these thoughts, emotions and behaviors with the group.    Charlye Spare L Lydie Stammen MSW, LCSWA  10/12/2015, 2:05 PM  

## 2015-10-12 NOTE — Progress Notes (Signed)
Pt has been pleasant and cooperative . Pt denies SI and A/V hallucinations. Pt's mood and affect has been depressed. Pt noted to be some what confused at times. Pt has been seclusive to her room.

## 2015-10-12 NOTE — [facility] Suicide Risk Assessment (Signed)
[facility] Admission Suicide Risk Assessment   Nursing information obtained from:  Patient Demographic factors:  Unemployed, Low socioeconomic status Current Mental Status:  NA Loss Factors:  NA Historical Factors:  NA Risk Reduction Factors:  Sense of responsibility to family, Positive social support, Living with another person, especially a relative, Positive coping skills or problem solving skills  Total Time spent with patient: 1 hour Principal Problem: Bipolar 1 disorder, mixed, severe (HCC) Diagnosis:   Patient Active Problem List   Diagnosis Date Noted  . Bipolar 1 disorder, mixed, severe (HCC) [F31.63] 10/11/2015  . Bipolar disorder with severe depression (HCC) [F31.4]   . Diabetes type 2, controlled (HCC) [E11.9] 07/07/2015  . Obesity [E66.9] 07/07/2015  . Tobacco use disorder [F17.200] 07/07/2015  . Hypothyroidism [E03.9] 07/07/2015  . Bipolar 1 disorder, manic, moderate (HCC) [F31.12] 07/01/2015  . Acid reflux [K21.9] 11/07/2014  . HLD (hyperlipidemia) [E78.5] 11/07/2014  . Psoriasis [L40.9] 11/07/2014   Subjective Data: see HP. Patient has difficulty sleeping, poor appetite, racing thoughts.  Continued Clinical Symptoms:  Alcohol Use Disorder Identification Test Final Score (AUDIT): 0 The "Alcohol Use Disorders Identification Test", Guidelines for Use in Primary Care, Second Edition.  World Science writer [facility]). Score between 0-7:  no or low risk or alcohol related problems. Score between 8-15:  moderate risk of alcohol related problems. Score between 16-19:  high risk of alcohol related problems. Score 20 or above:  warrants further diagnostic evaluation for alcohol dependence and treatment.   CLINICAL FACTORS:   Bipolar Disorder:   Mixed State   Musculoskeletal: Strength & Muscle Tone: within normal limits Gait & Station: appears to have difficulty walking Patient leans: N/A  Psychiatric Specialty Exam: Physical Exam Concur with PE in ED Constitutional:  Alert and oriented. Morbidly obese and in no acute distress. Eyes: Conjunctivae are normal. PERRL. EOMI. Head: Atraumatic. Nose: No congestion/rhinnorhea. Mouth/Throat: Mucous membranes are moist.  Neck: No stridor.  Cardiovascular: Normal rate, regular rhythm. Grossly normal heart sounds.  Respiratory: Normal respiratory effort. No retractions. Lungs CTAB. Gastrointestinal: Soft and nontender. No distention. Musculoskeletal: No lower extremity tenderness nor edema. No joint effusions. Neurologic: Normal speech and language. No gross focal neurologic deficits are appreciated.  Skin: Skin is warm, dry and intact. No rash noted.   ROS ROS l: Alert and oriented. Well appearing and in no acute distress. Eyes: Conjunctivae are normal. PERRL. EOMI. Head: Atraumatic. Nose: No congestion/rhinnorhea. Mouth/Throat: Mucous membranes are moist.  Neck: No stridor.  Cardiovascular: Normal rate, regular rhythm. Grossly normal heart sounds.  Respiratory: Normal respiratory effort. No retractions. Lungs CTAB. Gastrointestinal: Soft and nontender. No distention. Musculoskeletal: No lower extremity tenderness nor edema. No joint effusions. Neurologic: Normal speech and language. No gross focal neurologic deficits are appreciated.  Skin: Skin is warm, dry and intact. No rash noted. Psychiatric: Mood and affect are depressed. Speech and behavior are normal.   Blood pressure 135/58, pulse 66, temperature 98.6 F (37 C), temperature source Oral, resp. rate 20, height  (1.6 m), weight 122.018 kg (269 lb), SpO2 98 %.Body mass index is 47.66 kg/(m^2).  General Appearance: Disheveled  Eye Contact:  Fair  Speech:  Slow  Volume:  Normal  Mood:  Anxious and Depressed  Affect:  Blunt and Depressed  Thought Process:  Goal Directed  Orientation:  Full (Time, Place, and Person)  Thought Content:  Rumination  Suicidal Thoughts:  No  Homicidal Thoughts:  No  Memory:  Immediate;    Fair Recent;   Fair Remote;  Fair  Judgement:  Impaired  Insight:  Shallow  Psychomotor Activity:  Psychomotor Retardation  Concentration:  Concentration: Poor and Attention Span: Poor  Recall:  Poor  Fund of Knowledge:  Poor  Language:  Fair  Akathisia:  No  Handed:  Right  AIMS (if indicated):     Assets:  Communication Skills Desire for Improvement Housing Leisure Time Physical Health Social Support  ADL's:  Intact  Cognition:  Impaired,  Mild  Sleep:  Number of Hours: 7.75      COGNITIVE FEATURES THAT CONTRIBUTE TO RISK:  Loss of executive function    SUICIDE RISK:   Minimal: No identifiable suicidal ideation.  Patients presenting with few risk factors but with morbid ruminations; may be classified as minimal risk based on the severity of the depressive symptoms  PLAN OF CARE: Admit, monitor, and treat as appropriate  I certify that inpatient services furnished can reasonably be expected to improve the patient's condition.   Lockie ParesBell,  Kaela Beitz L, MD 10/12/2015, 2:32 PM

## 2015-10-12 NOTE — H&P (Signed)
Psychiatric Admission Assessment Adult  Patient Identification: ANBERLYN FEIMSTER MRN:  [id number] Date of Evaluation:  10/12/2015 Chief Complaint:  Bipolar Disorder; Recurrent severe without psychiatric treat Principal Diagnosis: Bipolar 1 disorder, mixed, severe (HCC) Diagnosis:   Patient Active Problem List   Diagnosis Date Noted  . Bipolar 1 disorder, mixed, severe (HCC) [F31.63] 10/11/2015  . Bipolar disorder with severe depression (HCC) [F31.4]   . Diabetes type 2, controlled (HCC) [E11.9] 07/07/2015  . Obesity [E66.9] 07/07/2015  . Tobacco use disorder [F17.200] 07/07/2015  . Hypothyroidism [E03.9] 07/07/2015  . Bipolar 1 disorder, manic, moderate (HCC) [F31.12] 07/01/2015  . Acid reflux [K21.9] 11/07/2014  . HLD (hyperlipidemia) [E78.5] 11/07/2014  . Psoriasis [L40.9] 11/07/2014   History of Present Illness:  65 year old  Morbidly obese woman admitted for worsening depression, poor sleep, and decreased appetite.   Patient states that she has been feeling poorly for a few weeks now. She feels that her  medications were working ok but she left two medications (she can not remember which ones) on the table when she went to her daughters house and didn't take those pills.   She feels "bad bad bad." She seem to have difficulty explaining her situation in detail and states her life is generally "great" though she is very depressed.   She has been having racing thoughts , decreased appetite and insomnia. Her mind races and can't fall asleep or stay asleep. Gets 2-3 hours of sleep a night but used to get 6 hours.   Mood is "worried, sad, distraught wondering if I will ever get over this. Last time I was here the medications were fine."  She feels her life is "perfectly fine" and  she has good people in her life. She used to spend her days cleaning and cooking but now just lays in bed.   She lives with her father, is on disability for bipolar. She is alert and oriented but when asked her  age she initially said "I don't know... Let's see, I was born in [birth year]..." She was able to get her age correct when asked again. Patient appears depressed, has mostly vague responses and poor insight.   Per nursing: D: Pt denies SI/HI/AVH. Pt is pleasant and cooperative, affect is flat and sad but brightens upon approach. Pt appears anxious and she is not interacting with peers and staff appropriately.  A: Pt was offered support and encouragement. Pt was given scheduled medications. Pt was encouraged to attend groups. Q 15 minute checks were done for safety.  R:Pt did not attend group. Pt is taking medication. Pt has no complaints.Pt receptive to treatment and safety maintained on unit.  Per consult note: Patient is a 65 year old morbidly obese female who was brought into the emergency room by her family members. She reported that she has recently been discharged from the inpatient behavioral health unit couple of months ago and she did well for a few weeks after her discharge. As she was compliant of her medications. She reported that she was following with [name] and was getting her medications. She reported that now she has been feeling depressed, she cannot eat and sleep. She has racing thoughts most of the time. She reported that she is unable to do anything. She is laying in bed most of the time. She appeared disheveled and depressed during the interview. She reported that she occasionally has auditory hallucinations she is unable to contract for safety at this time. She reported that she still lives  with her dad and he is doing terrible. Patient reported that she is taking the Abilify oral medications. Patient has worsening of her depression and reported that she has been experiencing issues to deal with her life situations.  Associated Signs/Symptoms: Depression Symptoms:  depressed mood, anhedonia, insomnia, psychomotor retardation, fatigue, feelings of worthlessness/guilt, difficulty  concentrating, impaired memory, decreased appetite, (Hypo) Manic Symptoms:  Distractibility, Anxiety Symptoms:  denies Psychotic Symptoms:  denies PTSD Symptoms: Negative Total Time spent with patient: 1 hour  Past Psychiatric History: bipolar   Is the patient at risk to self? No.  Has the patient been a risk to self in the past 6 months? No.  Has the patient been a risk to self within the distant past? No.  Is the patient a risk to others? No.  Has the patient been a risk to others in the past 6 months? No.  Has the patient been a risk to others within the distant past? No.   Prior Inpatient Therapy:   Prior Outpatient Therapy:    Alcohol Screening: 1. How often do you have a drink containing alcohol?: Never 9. Have you or someone else been injured as a result of your drinking?: No 10. Has a relative or friend or a doctor or another health worker been concerned about your drinking or suggested you cut down?: No Alcohol Use Disorder Identification Test Final Score (AUDIT): 0 Brief Intervention: AUDIT score less than 7 or less-screening does not suggest unhealthy drinking-brief intervention not indicated Substance Abuse History in the last 12 months:  No. Consequences of Substance Abuse: Negative   Denies substance use  Previous Psychotropic Medications: Yes  Psychological Evaluations: Yes  Was seeing [name]   Past Medical History:  Past Medical History  Diagnosis Date  . Hypothyroidism   . Depression   . Anxiety   . Diabetes mellitus, type II (HCC)     Patient takes Glucotrol and Januvia  . H/O diabetes mellitus 04/08/2015    borderline diabetes no medications needed.   . Bipolar 1 disorder [facility])     Past Surgical History  Procedure Laterality Date  . Abdominal hysterectomy     Family History:  Family History  Problem Relation Age of Onset  . Dementia Mother   . Arthritis Father    Family Psychiatric  History:  Denies mental illness in family Tobacco  Screening: @FLOW ([id number])::1)@ Social History:  History  Alcohol Use No     History  Drug Use No    Additional Social History:      History of alcohol / drug use?: No history of alcohol / drug abuse                    Allergies:   Allergies  Allergen Reactions  . Neomycin-Bacitracin Zn-Polymyx Rash   Lab Results:  Results for orders placed or performed during the hospital encounter of 10/11/15 (from the past 48 hour(s))  TSH     Status: Abnormal   Collection Time: 10/10/15  6:05 PM  Result Value Ref Range   TSH >90.000 (H) 0.350 - 4.500 uIU/mL    Comment: CONFIRMED BY MANUAL DILUTION  Glucose, capillary     Status: Abnormal   Collection Time: 10/12/15 11:59 AM  Result Value Ref Range   Glucose-Capillary 187 (H) 65 - 99 mg/dL    Blood Alcohol level:  Lab Results  Component Value Date   ETH <5 10/10/2015   ETH <5 06/30/2015    Metabolic Disorder Labs:  Lab Results  Component Value Date   HGBA1C 5.8 07/03/2015   Lab Results  Component Value Date   PROLACTIN 15.1 07/03/2015   Lab Results  Component Value Date   CHOL 175 07/03/2015   TRIG 298* 07/03/2015   HDL 22* 07/03/2015   CHOLHDL 8.0 07/03/2015   VLDL 60* 07/03/2015   LDLCALC 93 07/03/2015   LDLCALC SEE COMMENT 06/17/2011    Current Medications: Current Facility-Administered Medications  Medication Dose Route Frequency Provider Last Rate Last Dose  . acetaminophen (TYLENOL) tablet 650 mg  650 mg Oral Q6H PRN Brandy HaleUzma Faheem, MD      . alum & mag hydroxide-simeth (MAALOX/MYLANTA) 200-200-20 MG/5ML suspension 30 mL  30 mL Oral Q4H PRN Brandy HaleUzma Faheem, MD      . ARIPiprazole (ABILIFY) tablet 30 mg  30 mg Oral QHS Brandy HaleUzma Faheem, MD   30 mg at 10/11/15 2153  . aspirin chewable tablet 81 mg  81 mg Oral Daily Brandy HaleUzma Faheem, MD   81 mg at 10/12/15 0756  . buPROPion (WELLBUTRIN SR) 12 hr tablet 100 mg  100 mg Oral BID Dabid [place] L Lyal Husted, MD      . glipiZIDE (GLUCOTROL) tablet 10 mg  10 mg Oral BID AC Brandy HaleUzma Faheem,  MD   10 mg at 10/12/15 0754  . levothyroxine (SYNTHROID, LEVOTHROID) tablet 50 mcg  50 mcg Oral QAC breakfast Brandy HaleUzma Faheem, MD   50 mcg at 10/12/15 0754  . magnesium hydroxide (MILK OF MAGNESIA) suspension 30 mL  30 mL Oral Daily PRN Brandy HaleUzma Faheem, MD       PTA Medications: Prescriptions prior to admission  Medication Sig Dispense Refill Last Dose  . ARIPiprazole (ABILIFY) 30 MG tablet Take 1 tablet (30 mg total) by mouth daily. (Patient taking differently: Take 30 mg by mouth at bedtime. ) 30 tablet 0 Unknown at Unknown  . aspirin 81 MG chewable tablet Chew 81 mg by mouth daily.   Unknown at Unknown  . buPROPion (WELLBUTRIN SR) 150 MG 12 hr tablet Take 150 mg by mouth 2 (two) times daily.   Unknown at Unknown  . furosemide (LASIX) 20 MG tablet Take 20 mg by mouth daily as needed.   PRN at PRN  . glipiZIDE (GLUCOTROL) 10 MG tablet Take 10 mg by mouth 2 (two) times daily before a meal.    Unknown at Unknown  . levothyroxine (SYNTHROID, LEVOTHROID) 50 MCG tablet Take 50 mcg by mouth every morning.   Unknown at Unknown    Musculoskeletal: Strength & Muscle Tone: within normal limits Gait & Station: walks slowly Patient leans: N/A  Psychiatric Specialty Exam: Physical Exam  Concur with PE in ED Constitutional: Alert and oriented. Morbidly obese and in no acute distress. Eyes: Conjunctivae are normal. PERRL. EOMI. Head: Atraumatic. Nose: No congestion/rhinnorhea. Mouth/Throat: Mucous membranes are moist.  Neck: No stridor.  Cardiovascular: Normal rate, regular rhythm. Grossly normal heart sounds.  Respiratory: Normal respiratory effort. No retractions. Lungs CTAB. Gastrointestinal: Soft and nontender. No distention. Musculoskeletal: No lower extremity tenderness nor edema. No joint effusions. Neurologic: Normal speech and language. No gross focal neurologic deficits are appreciated.  Skin: Skin is warm, dry and intact. No rash noted.    ROS l: Alert and oriented. Well appearing  and in no acute distress. Eyes: Conjunctivae are normal. PERRL. EOMI. Head: Atraumatic. Nose: No congestion/rhinnorhea. Mouth/Throat: Mucous membranes are moist.  Neck: No stridor.  Cardiovascular: Normal rate, regular rhythm. Grossly normal heart sounds.  Respiratory: Normal respiratory effort. No retractions. Lungs CTAB. Gastrointestinal:  Soft and nontender. No distention. Musculoskeletal: No lower extremity tenderness nor edema. No joint effusions. Neurologic: Normal speech and language. No gross focal neurologic deficits are appreciated.  Skin: Skin is warm, dry and intact. No rash noted. Psychiatric: Mood and affect are depressed. Speech and behavior are normal.  Blood pressure 135/58, pulse 66, temperature 98.6 F (37 C), temperature source Oral, resp. rate 20, height  (1.6 m), weight 122.018 kg (269 lb), SpO2 98 %.Body mass index is 47.66 kg/(m^2).  General Appearance: Disheveled  Eye Contact:  Fair  Speech:  Slow  Volume:  Normal  Mood:  Depressed  Affect:  Blunt and Depressed  Thought Process:  Goal Directed  Orientation:  Full (Time, Place, and Person)  Thought Content:  Rumination  Suicidal Thoughts:  No  Homicidal Thoughts:  No  Memory:  Immediate;   Fair Recent;   Fair Remote;   Fair  Judgement:  Impaired  Insight:  Shallow  Psychomotor Activity:  Psychomotor Retardation  Concentration:  Concentration: Poor  Recall:  Poor  Fund of Knowledge:  Poor  Language:  Fair  Akathisia:  No  Handed:  Right   AIMS (if indicated):     Assets:  Communication Skills Desire for Improvement Housing Leisure Time Physical Health Social Support  ADL's:  Intact  Cognition:  Impaired,  Mild  Sleep:  Number of Hours: 47.72    65 year old with bipolar disorder depressive symptoms, difficulty sleeping and racing thoughts.   Treatment Plan Summary: Daily contact with patient to assess and evaluate symptoms and progress in treatment and Medication  management  Observation Level/Precautions:  15 minute checks  Laboratory:  as ordered  Psychotherapy:  Milieu and group  Medications:  Abilify 30 mg moved to daytime as this can be activating for some patients and may worsen sleep. Discontinued Wellbutrin 150 mg BID as antidepressants may worsen potential manic symptoms.   Seroquel 50 mg QHS to further address sleep, racing thoughts.   Will monitor symptoms  Consultations:    Discharge Concerns:    Estimated LOS: 5-7 days  Other:  QTC wnl    I certify that inpatient services furnished can reasonably be expected to improve the patient's condition.    Lockie Pares, MD 5/28/20172:22 PM

## 2015-10-13 LAB — GLUCOSE, CAPILLARY
Glucose-Capillary: 147 mg/dL — ABNORMAL HIGH (ref 65–99)
Glucose-Capillary: 164 mg/dL — ABNORMAL HIGH (ref 65–99)
Glucose-Capillary: 77 mg/dL (ref 65–99)

## 2015-10-13 MED ORDER — HYDROXYZINE HCL 25 MG PO TABS: 25.0000 mg | ORAL_TABLET | ORAL | Status: DC | PRN

## 2015-10-13 MED ORDER — SALINE SPRAY 0.65 % NA SOLN
1.0000 | NASAL | Status: DC | PRN
  Filled 2015-10-13: qty 44

## 2015-10-13 MED ORDER — LORATADINE 10 MG PO TABS
10.0000 mg | ORAL_TABLET | Freq: Every day | ORAL | Status: DC
  Administered 2015-10-13 – 2015-10-15 (×3): 10 mg via ORAL
  Filled 2015-10-13 (×3): qty 1

## 2015-10-13 NOTE — Plan of Care (Signed)
Problem: Activity: Goal: Imbalance in normal sleep/wake cycle will improve Outcome: Not Progressing Patient remains with depressed affect, low energy.

## 2015-10-13 NOTE — Progress Notes (Signed)
Recreation Therapy Notes  Date: 05.29.17 Time: 11:00 am Location: Craft Room  Group Topic: Self-expression  Goal Area(s) Addresses:  Patient will identify one color per emotion listed on wheel. Patient will verbalize benefit of using art as a means of self-expression. Patient will verbalize one emotion experienced during session. Patient will be educated on other forms of self-expression.  Behavioral Response: Attentive, Left early  Intervention: Emotion Wheel  Activity: Patients were given an Emotion Wheel worksheet and instructed to pick a color for each emotion listed.   Education: LRT educated patients on other forms of self-expression.  Education Outcome: Patient left before LRT educated group.  Clinical Observations/Feedback: Patient worked on activity by picking a color for some of the emotions. Patient left group at approximately 11:17 am with Dr. Alvester MorinBell. Patient did not return to group.  Jacquelynn CreeGreene,Arlette Schaad M, LRT/CTRS 10/13/2015 12:07 PM

## 2015-10-13 NOTE — Progress Notes (Signed)
Patient with depressed affect, cooperative behavior with meals, meds and plan of care. No SI/HI a this time. Minimal interaction with peers. Appropriate when staff initiates interaction. No SI/HI/AVH at this time. Therapy groups encouraged. Patient rests in bed during free time. Safety maintained.

## 2015-10-13 NOTE — Plan of Care (Signed)
Problem: Medication: Goal: Compliance with prescribed medication regimen will improve Outcome: Progressing Patient was compliant with all medications

## 2015-10-13 NOTE — Progress Notes (Signed)
D: Patient appears very depressed. Rates her depression, anxiety, and hopelessness 10/10. Denies SI/HI/AVH. Complaining that she hasn't been able to sleep. Attended group and ate snack. Patient was very adamant about not taking Seroquel. She stated she doesn't like to take it but could did not what it was for and and could not give a reason for why she didn't want to take it.  A: With encouragement patient took Seroquel. MD on call notified about sleep disturbances and anxiety. PRN Ambien ordered and given.  R: Patient was compliant with meds. She has remained calm and cooperative. Safety maintained with 15 min checks.

## 2015-10-13 NOTE — [facility] Group Notes (Signed)
[facility] LCSW Group Therapy   10/13/2015 9:30am Type of Therapy: Group Therapy   Participation Level: Active   Participation Quality: Attentive, Sharing and Supportive   Affect: Depressed and Flat   Cognitive: Alert and Oriented   Insight: Developing/Improving and Engaged   Engagement in Therapy: Developing/Improving and Engaged   Modes of Intervention: Clarification, Confrontation, Discussion, Education, Exploration,  Limit-setting, Orientation, Problem-solving, Rapport Building, Dance movement psychotherapisteality Testing, Socialization and Support   Summary of Progress/Problems: Pt identified obstacles faced currently and processed barriers involved in overcoming these obstacles. Pt identified steps necessary for overcoming these obstacles and explored motivation (internal and external) for facing these difficulties head on. Pt further identified one area of concern in their lives and chose a goal to focus on for today. Pt did not share extensively.  Pt answered prompts and questions from the CSW with one or two word answers.  Pt was polite and cooperative with the CSW and other group members and focused and attentive to the topics discussed and the sharing of others. Pt shared she wanted to "get better" but did not share beyond this.  Pt was a vague historian.  Dorothe PeaJonathan F. Adron Geisel, LCSWA, LCAS  10/13/15

## 2015-10-13 NOTE — Progress Notes (Addendum)
[facility] MD Progress Note  10/13/2015 11:20 AM Julie Morrow  MRN:  [id number] Subjective:   Patient was in the arts and crafts group.  She states that she is still feeling depressed.  She is hoping to be restarted on the medications that she was discharged on back in February.  Patient was discharged on Abilify 30 mg and she is again taking this medication.  She stated that she does not like Seroquel because  "I do not know.  I just do not like it."   She denies any allergies to this medication.   In her last admission, Cymbalta was discontinued   Therefore we will not be restarting it.   Patient later stated that she will "give Seroquel a try.  Hopefully it will help me feel better."  She states that she still has a decreased appetite, and is not sleeping well.   patient denies having sleep apnea and states that she has been checked and she does not have this nor does she snore.  She seems to feel hopeless about her ability to get better but admits that she had a desire to come out of her room more today and therefore she was able to go to more groups.    Upon further questioning, patient admits that she is somewhat stressed out because her daughter is getting married in May move further away from her.  She has not told this to anyone else yet.  This may be a source of her recent decrease in mood and increased anxiety. She denies SII/HI/AVH.      Per nursing:D: Patient appears very depressed. Rates her depression, anxiety, and hopelessness 10/10. Denies SI/HI/AVH. Complaining that she hasn't been able to sleep. Attended group and ate snack. Patient was very adamant about not taking Seroquel. She stated she doesn't like to take it but could did not what it was for and and could not give a reason for why she didn't want to take it.  A: With encouragement patient took Seroquel. MD on call notified about sleep disturbances and anxiety. PRN Ambien ordered and given.  R: Patient was compliant with meds. She  has remained calm and cooperative. Safety maintained with 15 min checks. .   Principal Problem: Bipolar 1 disorder, mixed, severe (HCC) Diagnosis:   Patient Active Problem List   Diagnosis Date Noted  . Bipolar 1 disorder, mixed, severe (HCC) [F31.63] 10/11/2015  . Bipolar disorder with severe depression (HCC) [F31.4]   . Diabetes type 2, controlled (HCC) [E11.9] 07/07/2015  . Obesity [E66.9] 07/07/2015  . Tobacco use disorder [F17.200] 07/07/2015  . Hypothyroidism [E03.9] 07/07/2015  . Bipolar 1 disorder, manic, moderate (HCC) [F31.12] 07/01/2015  . Acid reflux [K21.9] 11/07/2014  . HLD (hyperlipidemia) [E78.5] 11/07/2014  . Psoriasis [L40.9] 11/07/2014   Total Time spent with patient: 30 minutes  Past Psychiatric History: bipolar disorder  Past Medical History:  Past Medical History  Diagnosis Date  . Hypothyroidism   . Depression   . Anxiety   . Diabetes mellitus, type II (HCC)     Patient takes Glucotrol and Januvia  . H/O diabetes mellitus 04/08/2015    borderline diabetes no medications needed.   . Bipolar 1 disorder [facility])     Past Surgical History  Procedure Laterality Date  . Abdominal hysterectomy     Family History:  Family History  Problem Relation Age of Onset  . Dementia Mother   . Arthritis Father    Family Psychiatric  History: Patient denies  Social History:  History  Alcohol Use No     History  Drug Use No    Social History   Social History  . Marital Status: Single    Spouse Name: N/A  . Number of Children: N/A  . Years of Education: N/A   Social History Main Topics  . Smoking status: Former Smoker -- 0.25 packs/day for 5 years    Types: Cigarettes    Start date: 11/06/1984  . Smokeless tobacco: Never Used  . Alcohol Use: No  . Drug Use: No  . Sexual Activity: No   Other Topics Concern  . None   Social History Narrative   Additional Social History:    History of alcohol / drug use?: No history of alcohol / drug abuse                      Sleep: Fair  Appetite:  Fair  Current Medications: Current Facility-Administered Medications  Medication Dose Route Frequency Provider Last Rate Last Dose  . acetaminophen (TYLENOL) tablet 650 mg  650 mg Oral Q6H PRN Brandy Hale, MD   650 mg at 10/12/15 2222  . alum & mag hydroxide-simeth (MAALOX/MYLANTA) 200-200-20 MG/5ML suspension 30 mL  30 mL Oral Q4H PRN Brandy Hale, MD      . ARIPiprazole (ABILIFY) tablet 30 mg  30 mg Oral Daily Colby Catanese Rhae Lerner, MD   30 mg at 10/13/15 0939  . aspirin chewable tablet 81 mg  81 mg Oral Daily Brandy Hale, MD   81 mg at 10/13/15 0939  . glipiZIDE (GLUCOTROL) tablet 10 mg  10 mg Oral BID AC Brandy Hale, MD   10 mg at 10/13/15 0939  . levothyroxine (SYNTHROID, LEVOTHROID) tablet 50 mcg  50 mcg Oral QAC breakfast Brandy Hale, MD   50 mcg at 10/13/15 [phone]  . magnesium hydroxide (MILK OF MAGNESIA) suspension 30 mL  30 mL Oral Daily PRN Brandy Hale, MD      . QUEtiapine (SEROQUEL) tablet 100 mg  100 mg Oral QHS Marishka Rentfrow L Thedora Rings, MD      . zolpidem (AMBIEN) tablet 5 mg  5 mg Oral QHS PRN Lockie Pares, MD   5 mg at 10/12/15 2316    Lab Results:  Results for orders placed or performed during the hospital encounter of 10/11/15 (from the past 48 hour(s))  Glucose, capillary     Status: Abnormal   Collection Time: 10/12/15 11:59 AM  Result Value Ref Range   Glucose-Capillary 187 (H) 65 - 99 mg/dL  Glucose, capillary     Status: Abnormal   Collection Time: 10/12/15  4:37 PM  Result Value Ref Range   Glucose-Capillary 63 (L) 65 - 99 mg/dL  Glucose, capillary     Status: Abnormal   Collection Time: 10/13/15  6:54 AM  Result Value Ref Range   Glucose-Capillary 147 (H) 65 - 99 mg/dL    Blood Alcohol level:  Lab Results  Component Value Date   ETH <5 10/10/2015   ETH <5 06/30/2015    Physical Findings: AIMS: Facial and Oral Movements Muscles of Facial Expression: None, normal Lips and Perioral Area: None, normal Jaw: None,  normal Tongue: None, normal,Extremity Movements Upper (arms, wrists, hands, fingers): None, normal Lower (legs, knees, ankles, toes): None, normal, Trunk Movements Neck, shoulders, hips: None, normal, Overall Severity Severity of abnormal movements (highest score from questions above): None, normal Incapacitation due to abnormal movements: None, normal Patient's awareness of abnormal movements (rate only patient's  report): No Awareness, Dental Status Current problems with teeth and/or dentures?: No Does patient usually wear dentures?: No  CIWA:  CIWA-Ar Total: 0 COWS:  COWS Total Score: 0  Musculoskeletal: Strength & Muscle Tone: within normal limits Gait & Station: unsteady Patient leans: N/A  Psychiatric Specialty Exam: Physical Exam  ROS  Blood pressure 134/68, pulse 61, temperature 98.1 F (36.7 C), temperature source Oral, resp. rate 20, height 5\' 3"  (1.6 m), weight 122.018 kg (269 lb), SpO2 98 %.Body mass index is 47.66 kg/(m^2).  General Appearance: Disheveled  Eye Contact:  Fair  Speech:  Slow  Volume:  Decreased  Mood:  Depressed, Dysphoric, Hopeless, Irritable and Worthless  Affect:  Depressed and Flat  Thought Process:  Linear  Orientation:  Full (Time, Place, and Person)  Thought Content:  Rumination  Denies hallucination  Suicidal Thoughts:  No  Homicidal Thoughts:  No  Memory:  Immediate;   Poor Recent;   Poor Remote;   Fair  Judgement:  Poor  Insight:  Lacking  Psychomotor Activity:  Decreased  Concentration:  Concentration: Fair and Attention Span: Fair  Recall:  Poor  Fund of Knowledge:  Poor  Language:  Fair  Akathisia:  No  Handed:    AIMS (if indicated):     Assets:  Desire for Improvement Physical Health Social Support  ADL's:  Intact  Cognition:  Impaired,  Mild  Sleep:  Number of Hours: 396.685    65 year old bipolar disorder with depressive symptoms, difficulty sleeping and racing thoughts.   She seems to have unrealistic expectations about  her improvement.  Patient feels hopeless because she has not returned to baseline in 1 day.  She initially stated that she did not want Seroquel but now is willing to try this medication.   Treatment Plan Summary: Daily contact with patient to assess and evaluate symptoms and progress in treatment and Medication management  -   Observation level: 15 minute checks   laboratory: As ordered   psychotherapy:  Per milieu and group therapy.    medication:  Wellbutrin 150 mg twice a day was discontinued due to possible worsening of her manic symptoms  With this antidepressant.    Seroquel 100 mg daily at bedtime to address sleep, racing thoughts, depressive symptoms.  Continue to monitor patient for worsening of depression or side effects.    Patient will need adequate follow-up  .  Estimated length of stay 5-7 days  I certify that inpatient services furnished can reasonably be expected to improve the patient's condition.   Lockie ParesBell,  Mubarak Bevens L, MD 10/13/2015, 11:20 AM

## 2015-10-14 LAB — GLUCOSE, CAPILLARY: Glucose-Capillary: 122 mg/dL — ABNORMAL HIGH (ref 65–99)

## 2015-10-14 MED ORDER — GUAIFENESIN-DM 100-10 MG/5ML PO SYRP
5.0000 mL | ORAL_SOLUTION | ORAL | Status: DC | PRN
  Filled 2015-10-14: qty 5

## 2015-10-14 MED ORDER — LITHIUM CARBONATE ER 300 MG PO TBCR
300.0000 mg | EXTENDED_RELEASE_TABLET | Freq: Two times a day (BID) | ORAL | Status: DC
  Administered 2015-10-14 – 2015-10-15 (×2): 300 mg via ORAL
  Filled 2015-10-14 (×2): qty 1

## 2015-10-14 MED ORDER — LINAGLIPTIN 5 MG PO TABS
5.0000 mg | ORAL_TABLET | Freq: Every day | ORAL | Status: DC
  Administered 2015-10-15: 5 mg via ORAL
  Filled 2015-10-14 (×3): qty 1

## 2015-10-14 MED ORDER — LEVOTHYROXINE SODIUM 200 MCG PO TABS
200.0000 ug | ORAL_TABLET | Freq: Every day | ORAL | Status: DC
  Administered 2015-10-15 – 2015-10-22 (×8): 200 ug via ORAL
  Filled 2015-10-14 (×8): qty 1

## 2015-10-14 MED ORDER — LORAZEPAM 2 MG PO TABS
2.0000 mg | ORAL_TABLET | Freq: Every day | ORAL | Status: DC
  Administered 2015-10-14 – 2015-10-19 (×6): 2 mg via ORAL
  Filled 2015-10-14 (×6): qty 1

## 2015-10-14 MED ORDER — NICOTINE 14 MG/24HR TD PT24
14.0000 mg | MEDICATED_PATCH | Freq: Every day | TRANSDERMAL | Status: DC
  Administered 2015-10-14 – 2015-10-22 (×9): 14 mg via TRANSDERMAL
  Filled 2015-10-14 (×9): qty 1

## 2015-10-14 MED ORDER — FENOFIBRATE 160 MG PO TABS
160.0000 mg | ORAL_TABLET | Freq: Every day | ORAL | Status: DC
  Administered 2015-10-15: 160 mg via ORAL
  Filled 2015-10-14 (×3): qty 1

## 2015-10-14 MED ORDER — ROSUVASTATIN CALCIUM 20 MG PO TABS
20.0000 mg | ORAL_TABLET | Freq: Every day | ORAL | Status: DC
  Administered 2015-10-15 – 2015-10-21 (×7): 20 mg via ORAL
  Filled 2015-10-14 (×8): qty 1

## 2015-10-14 NOTE — Progress Notes (Signed)
D: Patient appears very depressed. Still rates her depression, anxiety, and hopelessness 10/10. Denies SI/HI/AVH. Complaining that she hasn't been able to sleep but when asked if the Remus Lofflerambien worked she stated yes. Attended group and ate snack, but then states she has not been able to eat. Complains of racing thoughts and when asked what she thinks would help she stated, "drugs I guess." Very minimal insight.  Patient was very adamant about not taking Seroquel lastnight but willingly took it tonight.  A: Scheduled medications given with education. PRN Ambien was given. She needs constant reinforcement with education.  R: Patient was compliant with meds. She has remained calm and cooperative. Safety maintained with 15 min checks.

## 2015-10-14 NOTE — Progress Notes (Signed)
D:  Patient presents with flat affect.  Denies SI/HI/AVH.  Rates depression as 10/10 and a anxiety as 8/10.  Unable to verbalize any coping mechanisms. A:  Support and encouragement offered. Safety maintained.  Medications given.  R:  Medication compliant.

## 2015-10-14 NOTE — Progress Notes (Signed)
[facility]  10/14/2015 3:30 PM Julie DamesSusan B Morrow  MRN:  [id number] Subjective:  Patient tells me she has not been compliant with medications prior to coming in. She tells me she feels short of breath and says that she was on antibiotics prior to coming in. Per emergency room note from the physical examination did not show any signs of lung disease. There is also a chest  x-ray done that day that was within the normal limits.  Per nursing staff the patient has been somatic.  Patient tells me she does not feel well. He says the medications that she is taking brain are not helping her. She says that the medication she was on back in February did help her. I reviewed the medications and looks like she has been restarted on most of them.  Patient admited that she is somewhat stressed out because her daughter is getting married in May move further away from her.  She has not told this to anyone else yet.  This may be a source of her recent decrease in mood and increased anxiety. She denies SII/HI/AVH.      Per nursing: D: Patient appears very depressed. Still rates her depression, anxiety, and hopelessness 10/10. Denies SI/HI/AVH. Complaining that she hasn't been able to sleep but when asked if the Remus Lofflerambien worked she stated yes. Attended group and ate snack, but then states she has not been able to eat. Complains of racing thoughts and when asked what she thinks would help she stated, "drugs I guess." Very minimal insight. Patient was very adamant about not taking Seroquel lastnight but willingly took it tonight.  A: Scheduled medications given with education. PRN Ambien was given. She needs constant reinforcement with education.  R: Patient was compliant with meds. She has remained calm and cooperative. Safety maintained with 15 min checks.   Principal Problem: Bipolar 1 disorder, mixed, severe (HCC) Diagnosis:   Patient Active Problem List   Diagnosis Date Noted  . Bipolar 1 disorder,  mixed, severe (HCC) [F31.63] 10/11/2015  . Bipolar disorder with severe depression (HCC) [F31.4]   . Diabetes type 2, controlled (HCC) [E11.9] 07/07/2015  . Obesity [E66.9] 07/07/2015  . Tobacco use disorder [F17.200] 07/07/2015  . Hypothyroidism [E03.9] 07/07/2015  . Bipolar 1 disorder, manic, moderate (HCC) [F31.12] 07/01/2015  . Acid reflux [K21.9] 11/07/2014  . HLD (hyperlipidemia) [E78.5] 11/07/2014  . Psoriasis [L40.9] 11/07/2014   Total Time spent with patient: 30 minutes  Past Psychiatric History: bipolar disorder  Past Medical History:  Past Medical History  Diagnosis Date  . Hypothyroidism   . Depression   . Anxiety   . Diabetes mellitus, type II (HCC)     Patient takes Glucotrol and Januvia  . H/O diabetes mellitus 04/08/2015    borderline diabetes no medications needed.   . Bipolar 1 disorder [facility](HCC)     Past Surgical History  Procedure Laterality Date  . Abdominal hysterectomy     Family History:  Family History  Problem Relation Age of Onset  . Dementia Mother   . Arthritis Father    Family Psychiatric  History: Patient denies Social History:  History  Alcohol Use No     History  Drug Use No    Social History   Social History  . Marital Status: Single    Spouse Name: N/A  . Number of Children: N/A  . Years of Education: N/A   Social History Main Topics  . Smoking status: Former Smoker -- 0.25 packs/day  for 5 years    Types: Cigarettes    Start date: 11/06/1984  . Smokeless tobacco: Never Used  . Alcohol Use: No  . Drug Use: No  . Sexual Activity: No   Other Topics Concern  . None   Social History Narrative    Current Medications: Current Facility-Administered Medications  Medication Dose Route Frequency Provider Last Rate Last Dose  . acetaminophen (TYLENOL) tablet 650 mg  650 mg Oral Q6H PRN Brandy Hale, MD   650 mg at 10/12/15 2222  . alum & mag hydroxide-simeth (MAALOX/MYLANTA) 200-200-20 MG/5ML suspension 30 mL  30 mL Oral Q4H  PRN Brandy Hale, MD      . ARIPiprazole (ABILIFY) tablet 30 mg  30 mg Oral Daily Tiffani Rhae Lerner, MD   30 mg at 10/14/15 0855  . aspirin chewable tablet 81 mg  81 mg Oral Daily Brandy Hale, MD   81 mg at 10/14/15 0855  . fenofibrate tablet 160 mg  160 mg Oral Daily Jimmy Footman, MD      . glipiZIDE (GLUCOTROL) tablet 10 mg  10 mg Oral BID AC Brandy Hale, MD   10 mg at 10/14/15 0855  . [START ON 10/15/2015] levothyroxine (SYNTHROID, LEVOTHROID) tablet 200 mcg  200 mcg Oral QAC breakfast Jimmy Footman, MD      . linagliptin (TRADJENTA) tablet 5 mg  5 mg Oral Daily Jimmy Footman, MD      . lithium carbonate (LITHOBID) CR tablet 300 mg  300 mg Oral Q12H Jimmy Footman, MD      . loratadine (CLARITIN) tablet 10 mg  10 mg Oral Daily Lockie Pares, MD   10 mg at 10/14/15 0855  . LORazepam (ATIVAN) tablet 2 mg  2 mg Oral QHS Jimmy Footman, MD      . magnesium hydroxide (MILK OF MAGNESIA) suspension 30 mL  30 mL Oral Daily PRN Brandy Hale, MD      . nicotine (NICODERM CQ - dosed in mg/24 hours) patch 14 mg  14 mg Transdermal Daily Jimmy Footman, MD      . rosuvastatin (CRESTOR) tablet 20 mg  20 mg Oral q1800 Jimmy Footman, MD      . sodium chloride (OCEAN) 0.65 % nasal spray 1 spray  1 spray Each Nare PRN Lockie Pares, MD        Lab Results:  Results for orders placed or performed during the hospital encounter of 10/11/15 (from the past 48 hour(s))  Glucose, capillary     Status: Abnormal   Collection Time: 10/12/15  4:37 PM  Result Value Ref Range   Glucose-Capillary 63 (L) 65 - 99 mg/dL  Glucose, capillary     Status: Abnormal   Collection Time: 10/13/15  6:54 AM  Result Value Ref Range   Glucose-Capillary 147 (H) 65 - 99 mg/dL  Glucose, capillary     Status: Abnormal   Collection Time: 10/13/15 11:47 AM  Result Value Ref Range   Glucose-Capillary 164 (H) 65 - 99 mg/dL   Comment 1 Notify RN   Glucose,  capillary     Status: None   Collection Time: 10/13/15  4:30 PM  Result Value Ref Range   Glucose-Capillary 77 65 - 99 mg/dL   Comment 1 Notify RN   Glucose, capillary     Status: Abnormal   Collection Time: 10/14/15  6:43 AM  Result Value Ref Range   Glucose-Capillary 122 (H) 65 - 99 mg/dL    Blood Alcohol level:  Lab Results  Component Value Date   ETH <5 10/10/2015   ETH <5 06/30/2015    Physical Findings: AIMS: Facial and Oral Movements Muscles of Facial Expression: None, normal Lips and Perioral Area: None, normal Jaw: None, normal Tongue: None, normal,Extremity Movements Upper (arms, wrists, hands, fingers): None, normal Lower (legs, knees, ankles, toes): None, normal, Trunk Movements Neck, shoulders, hips: None, normal, Overall Severity Severity of abnormal movements (highest score from questions above): None, normal Incapacitation due to abnormal movements: None, normal Patient's awareness of abnormal movements (rate only patient's report): No Awareness, Dental Status Current problems with teeth and/or dentures?: No Does patient usually wear dentures?: No  CIWA:  CIWA-Ar Total: 0 COWS:  COWS Total Score: 0  Musculoskeletal: Strength & Muscle Tone: within normal limits Gait & Station: unsteady Patient leans: N/A  Psychiatric Specialty Exam: Physical Exam  Constitutional: She is oriented to person, place, and time. She appears well-developed and well-nourished.  HENT:  Head: Atraumatic.  Eyes: EOM are normal.  Neck: Normal range of motion.  Respiratory: Effort normal.  Musculoskeletal: Normal range of motion.  Neurological: She is alert and oriented to person, place, and time.    Review of Systems  Constitutional: Negative.   HENT: Negative.   Eyes: Negative.   Respiratory: Negative.   Cardiovascular: Negative.   Gastrointestinal: Negative.   Genitourinary: Negative.   Musculoskeletal: Negative.   Skin: Negative.   Neurological: Negative.    Endo/Heme/Allergies: Negative.   Psychiatric/Behavioral: Negative.     Blood pressure 130/58, pulse 68, temperature 97.6 F (36.4 C), temperature source Oral, resp. rate 20, height  (1.6 m), weight 122.018 kg (269 lb), SpO2 98 %.Body mass index is 47.66 kg/(m^2).  General Appearance: Disheveled  Eye Contact:  Fair  Speech:  Slow  Volume:  Decreased  Mood:  Depressed, Dysphoric, Hopeless, Irritable and Worthless  Affect:  Depressed and Flat  Thought Process:  Linear  Orientation:  Full (Time, Place, and Person)  Thought Content:  Rumination  Denies hallucination  Suicidal Thoughts:  No  Homicidal Thoughts:  No  Memory:  Immediate;   Poor Recent;   Poor Remote;   Fair  Judgement:  Poor  Insight:  Lacking  Psychomotor Activity:  Decreased  Concentration:  Concentration: Fair and Attention Span: Fair  Recall:  Poor  Fund of Knowledge:  Poor  Language:  Fair  Akathisia:  No  Handed:    AIMS (if indicated):     Assets:  Desire for Improvement Physical Health Social Support  ADL's:  Intact  Cognition:  Impaired,  Mild  Sleep:  Number of Hours: 8.25      Treatment Plan Summary: Daily contact with patient to assess and evaluate symptoms and progress in treatment and Medication management   Bipolar disorder type I. Patient will be reassessed started on Abilify 30 mg by mouth daily.I will also start her on lithium CR 300 mg by mouth twice a day  Insomnia the patient will be started on Ativan 2 mg by mouth daily at bedtime  Cardiovascular health the patient will be started on aspirin 81 mg a day  Dyslipidemia the patient will be continued on Crestor 20 mg a day and 1505 at 160 mg by mouth daily  Diabetes: I will change patient's diet to low carbohydrate. She will be started on Glucotrol 10 mg by mouth twice a day and tradjenta to 5 mg a day  Possible upper respiratory tract infection: I will order symptomatic treatment.  Tobacco use disorder I will order nicotine  patch  21 mg a day  Hospitalization and status continue involuntary commitment   Precautions every 15 minute checks   Estimated length of stay 5-7 days  I certify that inpatient services furnished can reasonably be expected to improve the patient's condition.   Jimmy Footman, MD 10/14/2015, 3:30 PM

## 2015-10-14 NOTE — [facility] Group Notes (Signed)
[facility] Group Notes:  (Nursing/MHT/Case Management/Adjunct)  Date:  10/14/2015  Time:  3:07 PM  Type of Therapy:  Psychoeducational Skills  Participation Level:  Did Not Attend    Julie Morrow M Julie Morrow 10/14/2015, 3:07 PM

## 2015-10-14 NOTE — Tx Team (Signed)
Interdisciplinary Treatment Plan Update (Adult)         Date: 10/14/2015   Time Reviewed: 9:30 AM   Progress in Treatment: Improving Attending groups: Intermittently Participating in groups: Intermittently  Taking medication as prescribed: Yes  Tolerating medication: Yes  Family/Significant other contact made: Yes, CSW has spoken with the pt's daughter  Patient understands diagnosis: Yes  Discussing patient identified problems/goals with staff: Yes  Medical problems stabilized or resolved: Yes  Denies suicidal/homicidal ideation: Yes  Issues/concerns per patient self-inventory: Yes  Other:   New problem(s) identified: N/A   Discharge Plan or Barriers: Pt will discharge to her home in [place] to live with her father and will follow up with [facility] in [place] for medication management and therapy and with [facility] for with her primary doctor.   Reason for Continuation of Hospitalization:   Depression   Anxiety   Medication Stabilization   Comments: N/A   Estimated length of stay: 3-5 days     Patient is a . 65 year old Morbidly obese woman admitted for worsening depression, poor sleep, and decreased appetite.    Patient states that she has been feeling poorly for a few weeks now. She feels that her medications were working ok but she left two medications (she can not remember which ones) on the table when she went to her daughters house and didn't take those pills.    She feels "bad bad bad." She seem to have difficulty explaining her situation in detail and states her life is generally "great" though she is very depressed.   She has been having racing thoughts , decreased appetite and insomnia. Her mind races and can't fall asleep or stay asleep. Gets 2-3 hours of sleep a night but used to get 6 hours.    Mood is "worried, sad, distraught wondering if I will ever get over this. Last time I was here the medications were fine." She  feels her life is "perfectly fine" and she has good people in her life. She used to spend her days cleaning and cooking but now just lays in bed.    She lives with her father, is on disability for bipolar. She is alert and oriented but when asked her age she initially said "I don't know... Let's see, I was born in [birth year]..." She was able to get her age correct when asked again. Patient appears depressed, has mostly vague responses and poor insight. Patient lives in [place].  Patient will benefit from crisis stabilization, medication evaluation, group therapy, and psycho education in addition to case management for discharge planning. Patient and CSW reviewed pt's identified goals and treatment plan. Pt verbalized understanding and agreed to treatment plan.    Review of initial/current patient goals per problem list:  1. Goal(s): Patient will participate in aftercare plan   Met: Yes  Target date: 3-5 days post admission date   As evidenced by: Patient will participate within aftercare plan AEB aftercare provider and housing plan at discharge being identified.   5/30:  Pt will discharge to her home in [facility] to live with her father and will follow up with [facility] in [place] for medication management and therapy and with [facility] for with her primary doctor.     2. Goal (s): Patient will exhibit decreased depressive symptoms and suicidal ideations.   Met: No  Target date: 3-5 days post admission date   As evidenced by: Patient will utilize self-rating of depression  at 3 or below and demonstrate decreased signs of depression or be deemed stable for discharge by MD.   5/30: Goal progressing.    3. Goal(s): Patient will demonstrate decreased signs and symptoms of anxiety.   Met: No  Target date: 3-5 days post admission date   As evidenced by: Patient will utilize self-rating of anxiety at 3 or below and demonstrated decreased signs of anxiety, or be  deemed stable for discharge by MD   5/30: Goal progressing    5. Goal (s): Patient will demonstrate decreased signs of mania  * Met: No * Target date: 3-5 days post admission date  * As evidenced by: Patient demonstrate decreased signs of mania AEB decreased mood instability and demonstration of stable mood   5/30: Goal progressing.    Attendees:  Patient:  Family:  Physician: Dr. Jerilee Hoh, MD 10/14/2015 9:30 AM  Nursing: Floyde Parkins, RN 10/14/2015 9:30 AM  Clinical Social Worker: Marylou Flesher, [place] 10/14/2015 9:30 AM  Nursing: Elige Radon, RN 10/14/2015 9:30 AM  Nursing: Carolynn Sayers, RN 10/14/2015 9:30 AM  Clinical Social Worker: [place], [place] 10/14/2015 9:30 AM  Clinical Social Worker: Dossie Arbour, LCSW 10/14/2015 9:30 AM   Marylou Flesher [place], [place]  10/14/15

## 2015-10-14 NOTE — [facility] Group Notes (Signed)
[facility] Group Notes:  (Nursing/MHT/Case Management/Adjunct)  Date:  10/14/2015  Time:  12:41 AM  Type of Therapy:  Psychoeducational Skills  Participation Level:  Did Not Attend    Summary of Progress/Problems:  Julie MilroyLaquanda Y [place] Morrow 10/14/2015, 12:41 AM

## 2015-10-14 NOTE — Progress Notes (Signed)
Recreation Therapy Notes  Date: 05.30.17 Time: 9:30 am Location: Craft Room  Group Topic: Goal Setting  Goal Area(s) Addresses:  Patient will be able to identify one goal. Patient will verbalize benefit of setting goals.  Behavioral Response: Attentive  Intervention: Step By Step  Activity: Patients were given a foot worksheet and instructed to write a goal inside the foot and write positive comments on the outside of the foot.  Education: LRT educated patients on ways they can celebrate in a healthy way after reaching their goal.  Education Outcome: Acknowledges education/In group clarification offered   Clinical Observations/Feedback: Patient completed activity by writing some goals and positive comments. Patient did not contribute to group discussion.  Jacquelynn CreeGreene,Philisha Weinel M, LRT/CTRS 10/14/2015 10:22 AM

## 2015-10-15 DIAGNOSIS — K219 Gastro-esophageal reflux disease without esophagitis: Secondary | ICD-10-CM

## 2015-10-15 LAB — GLUCOSE, CAPILLARY: Glucose-Capillary: 141 mg/dL — ABNORMAL HIGH (ref 65–99)

## 2015-10-15 MED ORDER — FENOFIBRATE 160 MG PO TABS
160.0000 mg | ORAL_TABLET | Freq: Every day | ORAL | Status: DC
  Administered 2015-10-16 – 2015-10-22 (×7): 160 mg via ORAL
  Filled 2015-10-15 (×7): qty 1

## 2015-10-15 MED ORDER — LITHIUM CARBONATE ER 450 MG PO TBCR
450.0000 mg | EXTENDED_RELEASE_TABLET | Freq: Two times a day (BID) | ORAL | Status: DC
  Administered 2015-10-15 – 2015-10-22 (×14): 450 mg via ORAL
  Filled 2015-10-15 (×14): qty 1

## 2015-10-15 MED ORDER — LORAZEPAM 0.5 MG PO TABS
0.2500 mg | ORAL_TABLET | Freq: Three times a day (TID) | ORAL | Status: DC
  Administered 2015-10-15 – 2015-10-19 (×12): 0.25 mg via ORAL
  Administered 2015-10-19: 13:00:00 via ORAL
  Administered 2015-10-20 (×2): 0.25 mg via ORAL
  Filled 2015-10-15 (×15): qty 1

## 2015-10-15 MED ORDER — ARIPIPRAZOLE 10 MG PO TABS
30.0000 mg | ORAL_TABLET | Freq: Every day | ORAL | Status: DC
  Administered 2015-10-16 – 2015-10-22 (×7): 30 mg via ORAL
  Filled 2015-10-15: qty 2
  Filled 2015-10-15: qty 3
  Filled 2015-10-15: qty 2
  Filled 2015-10-15: qty 1
  Filled 2015-10-15: qty 3
  Filled 2015-10-15 (×2): qty 2
  Filled 2015-10-15: qty 3

## 2015-10-15 MED ORDER — LINAGLIPTIN 5 MG PO TABS
5.0000 mg | ORAL_TABLET | Freq: Every day | ORAL | Status: DC
  Administered 2015-10-16 – 2015-10-22 (×7): 5 mg via ORAL
  Filled 2015-10-15 (×7): qty 1

## 2015-10-15 MED ORDER — ASPIRIN 81 MG PO CHEW
81.0000 mg | CHEWABLE_TABLET | Freq: Every day | ORAL | Status: DC
  Administered 2015-10-16 – 2015-10-22 (×7): 81 mg via ORAL
  Filled 2015-10-15 (×7): qty 1

## 2015-10-15 NOTE — [facility] Group Notes (Signed)
[facility] LCSW Group Therapy   10/15/2015 11am   Type of Therapy: Group Therapy   Participation Level: Active   Participation Quality: Attentive, Sharing and Supportive   Affect: Appropriate   Cognitive: Alert and Oriented   Insight: Developing/Improving and Engaged   Engagement in Therapy: Developing/Improving and Engaged   Modes of Intervention: Clarification, Confrontation, Discussion, Education, Exploration, Limit-setting, Orientation, Problem-solving, Rapport Building, Dance movement psychotherapisteality Testing, Socialization and Support   Summary of Progress/Problems: The topic for group was balance in life. Today's group focused on defining balance in one's own words, identifying things that can knock one off balance, and exploring healthy ways to maintain balance in life. Group members were asked to provide an example of a time when they felt off balance, describe how they handled that situation, and process healthier ways to regain balance in the future. Group members were asked to share the most important tool for maintaining balance that they [facility] and how they plan to apply this method after discharge.  Pt shared very little initially, but demonstrated a willingness to participate after prompting from the CSW for and presented as pleasant and calm and was an addition to the group dynamic.  Pt shared she has used her faith and helping and talking about her feelings in order to regulate her emotions.  Pt shared that she once withdrew from others and immediately began seeing the negative effects of her being alone and then her feelings and emotions then began to make her feel overwhelmed.  Pt was polite and cooperative with the CSW and other group members and focused and attentive to the topics discussed and the sharing of others.  Pt did not share unless prompted by the CSW, but did share when asked. Pt shared that talking to others was her most important tool for recovery.   Dorothe PeaJonathan F. Stephaniemarie Stoffel, LCSWA,  LCAS  10/15/15

## 2015-10-15 NOTE — Progress Notes (Signed)
Recreation Therapy Notes  Date: 05.31.17 Time: 9:30 am Location: Craft Room  Group Topic: Self-esteem  Goal Area(s) Addresses:  Patient will identify positive traits about self. Patient will identify healthy coping skills.  Behavioral Response: Did not attend  Intervention: All About Me  Activity: Patients were instructed to make a pamphlet including positive traits, healthy coping skills, and their support system.  Education: LRT educated patients on ways to increase their self-esteem.  Education Outcome: Patient did not attend group.   Clinical Observations/Feedback: Patient did not attend group.  Jacquelynn CreeGreene,Shaniqwa Horsman M, LRT/CTRS 10/15/2015 10:47 AM

## 2015-10-15 NOTE — Progress Notes (Signed)
D: Patient is alert and oriented on the unit this shift. Patient  Did not attend groups today. Patient denies suicidal ideation, homicidal ideation, auditory or visual hallucinations at the present time.  A: Scheduled medications are administered to patient as per MD orders. Emotional support and encouragement are provided. Patient is maintained on q.15 minute safety checks. Patient is informed to notify staff with questions or concerns. R: No adverse medication reactions are noted. Patient is cooperative with medication administration and treatment plan today. Patient is  Depressed 10/10 and anxious 10/10 reported today when asked but she is  calm and cooperative on the unit at this time. Patient does not interact with others on the unit this shift. Patient contracts for safety at this time. Patient remains safe at this time.

## 2015-10-15 NOTE — Progress Notes (Signed)
D: Patient appears very depressed. Still rates her depression, anxiety, and hopelessness 10/10. Denies SI/HI/AVH.  A: Scheduled medications given with education. Ativan given for sleep. She needs constant reinforcement with education.  R: Patient was compliant with meds. She has remained calm and cooperative. Safety maintained with 15 min checks.

## 2015-10-15 NOTE — Progress Notes (Signed)
[facility] MD Progress Note  10/15/2015 5:15 PM Julie Morrow  MRN:  [id number] Subjective:  Patient tells me she has not been compliant with medications prior to coming in. She tells me she feels short of breath and says that she was on antibiotics prior to coming in. Per emergency room note from the physical examination did not show any signs of lung disease. There is also a chest  x-ray done that day that was within the normal limits.  Per nursing staff the patient has been somatic.  Patient tells me she does not feel well. He says the medications that she is taking brain are not helping her. She says that the medication she was on back in February did help her. I reviewed the medications and looks like she has been restarted on most of them.  Patient admited that she is somewhat stressed out because her daughter is getting married in May move further away from her.  She has not told this to anyone else yet.  This may be a source of her recent decrease in mooD: Patient appears very depressed. Still rates her depression, anxiety, and hopelessness 10/10. Denies SI/HI/AVH.    5/31---Today Pt reports her mood is still very depressed. C/o poor sleep, poor energy and appetite.  Denies SI, HI or hallucinations.  Denies SE from medications or physical complaints. States there has been no improvement since admission.  She is asking for a "nerve" pill as she feels restless inside (not visibly restless)  Per nursing: A: Scheduled medications given with education. Ativan given for sleep. She needs constant reinforcement with education.  R: Patient was compliant with meds. She has remained calm and cooperative. Safety maintained with 15 min checks. d and increased anxiety. She denies SII/HI/AVH.       Principal Problem: Bipolar 1 disorder, mixed, severe (HCC) Diagnosis:   Patient Active Problem List   Diagnosis Date Noted  . GERD (gastroesophageal reflux disease) [K21.9] 10/15/2015  . Bipolar 1 disorder,  mixed, severe (HCC) [F31.63] 10/11/2015  . Diabetes type 2, controlled (HCC) [E11.9] 07/07/2015  . Obesity [E66.9] 07/07/2015  . Tobacco use disorder [F17.200] 07/07/2015  . Hypothyroidism [E03.9] 07/07/2015  . HLD (hyperlipidemia) [E78.5] 11/07/2014  . Psoriasis [L40.9] 11/07/2014   Total Time spent with patient: 30 minutes  Past Psychiatric History: bipolar disorder  Past Medical History:  Past Medical History  Diagnosis Date  . Hypothyroidism   . Depression   . Anxiety   . Diabetes mellitus, type II (HCC)     Patient takes Glucotrol and Januvia  . H/O diabetes mellitus 04/08/2015    borderline diabetes no medications needed.   . Bipolar 1 disorder [facility])     Past Surgical History  Procedure Laterality Date  . Abdominal hysterectomy     Family History:  Family History  Problem Relation Age of Onset  . Dementia Mother   . Arthritis Father    Family Psychiatric  History: Patient denies Social History:  History  Alcohol Use No     History  Drug Use No    Social History   Social History  . Marital Status: Single    Spouse Name: N/A  . Number of Children: N/A  . Years of Education: N/A   Social History Main Topics  . Smoking status: Former Smoker -- 0.25 packs/day for 5 years    Types: Cigarettes    Start date: 11/06/1984  . Smokeless tobacco: Never Used  . Alcohol Use: No  . Drug Use:  No  . Sexual Activity: No   Other Topics Concern  . None   Social History Narrative    Current Medications: Current Facility-Administered Medications  Medication Dose Route Frequency Provider Last Rate Last Dose  . acetaminophen (TYLENOL) tablet 650 mg  650 mg Oral Q6H PRN Brandy Hale, MD   650 mg at 10/12/15 2222  . alum & mag hydroxide-simeth (MAALOX/MYLANTA) 200-200-20 MG/5ML suspension 30 mL  30 mL Oral Q4H PRN Brandy Hale, MD      . Melene Muller ON 10/16/2015] ARIPiprazole (ABILIFY) tablet 30 mg  30 mg Oral Q breakfast Jimmy Footman, MD      . Melene Muller ON  10/16/2015] aspirin chewable tablet 81 mg  81 mg Oral Q breakfast Jimmy Footman, MD      . Melene Muller ON 10/16/2015] fenofibrate tablet 160 mg  160 mg Oral Q breakfast Jimmy Footman, MD      . glipiZIDE (GLUCOTROL) tablet 10 mg  10 mg Oral BID AC Brandy Hale, MD   10 mg at 10/15/15 1013  . guaiFENesin-dextromethorphan (ROBITUSSIN DM) 100-10 MG/5ML syrup 5 mL  5 mL Oral Q4H PRN Jimmy Footman, MD      . levothyroxine (SYNTHROID, LEVOTHROID) tablet 200 mcg  200 mcg Oral QAC breakfast Jimmy Footman, MD   200 mcg at 10/15/15 [phone]  . [START ON 10/16/2015] linagliptin (TRADJENTA) tablet 5 mg  5 mg Oral Q breakfast Jimmy Footman, MD      . lithium carbonate (ESKALITH) CR tablet 450 mg  450 mg Oral Q12H Jimmy Footman, MD      . LORazepam (ATIVAN) tablet 0.25 mg  0.25 mg Oral TID WC Jimmy Footman, MD      . LORazepam (ATIVAN) tablet 2 mg  2 mg Oral QHS Jimmy Footman, MD   2 mg at 10/14/15 2126  . magnesium hydroxide (MILK OF MAGNESIA) suspension 30 mL  30 mL Oral Daily PRN Brandy Hale, MD      . nicotine (NICODERM CQ - dosed in mg/24 hours) patch 14 mg  14 mg Transdermal Daily Jimmy Footman, MD   14 mg at 10/15/15 1012  . rosuvastatin (CRESTOR) tablet 20 mg  20 mg Oral q1800 Jimmy Footman, MD   20 mg at 10/14/15 1800  . sodium chloride (OCEAN) 0.65 % nasal spray 1 spray  1 spray Each Nare PRN Lockie Pares, MD        Lab Results:  Results for orders placed or performed during the hospital encounter of 10/11/15 (from the past 48 hour(s))  Glucose, capillary     Status: Abnormal   Collection Time: 10/14/15  6:43 AM  Result Value Ref Range   Glucose-Capillary 122 (H) 65 - 99 mg/dL  Glucose, capillary     Status: Abnormal   Collection Time: 10/15/15  6:46 AM  Result Value Ref Range   Glucose-Capillary 141 (H) 65 - 99 mg/dL    Blood Alcohol level:  Lab Results  Component Value Date   ETH <5  10/10/2015   ETH <5 06/30/2015    Physical Findings: AIMS: Facial and Oral Movements Muscles of Facial Expression: None, normal Lips and Perioral Area: None, normal Jaw: None, normal Tongue: None, normal,Extremity Movements Upper (arms, wrists, hands, fingers): None, normal Lower (legs, knees, ankles, toes): None, normal, Trunk Movements Neck, shoulders, hips: None, normal, Overall Severity Severity of abnormal movements (highest score from questions above): None, normal Incapacitation due to abnormal movements: None, normal Patient's awareness of abnormal movements (rate only patient's report): No Awareness, Dental Status  Current problems with teeth and/or dentures?: No Does patient usually wear dentures?: No  CIWA:  CIWA-Ar Total: 0 COWS:  COWS Total Score: 0  Musculoskeletal: Strength & Muscle Tone: within normal limits Gait & Station: unsteady Patient leans: N/A  Psychiatric Specialty Exam: Physical Exam  Constitutional: She is oriented to person, place, and time. She appears well-developed and well-nourished.  HENT:  Head: Atraumatic.  Eyes: EOM are normal.  Neck: Normal range of motion.  Respiratory: Effort normal.  Musculoskeletal: Normal range of motion.  Neurological: She is alert and oriented to person, place, and time.    Review of Systems  Constitutional: Negative.   HENT: Negative.   Eyes: Negative.   Respiratory: Negative.   Cardiovascular: Negative.   Gastrointestinal: Negative.   Genitourinary: Negative.   Musculoskeletal: Negative.   Skin: Negative.   Neurological: Negative.   Endo/Heme/Allergies: Negative.   Psychiatric/Behavioral: Negative.     Blood pressure 143/61, pulse 68, temperature 97.7 F (36.5 C), temperature source Oral, resp. rate 20, height 5\' 3"  (1.6 m), weight 122.018 kg (269 lb), SpO2 98 %.Body mass index is 47.66 kg/(m^2).  General Appearance: Disheveled  Eye Contact:  Fair  Speech:  Slow  Volume:  Decreased  Mood:   Depressed, Dysphoric, Hopeless, Irritable and Worthless  Affect:  Depressed and Flat  Thought Process:  Linear  Orientation:  Full (Time, Place, and Person)  Thought Content:  Rumination  Denies hallucination  Suicidal Thoughts:  No  Homicidal Thoughts:  No  Memory:  Immediate;   Poor Recent;   Poor Remote;   Fair  Judgement:  Poor  Insight:  Lacking  Psychomotor Activity:  Decreased  Concentration:  Concentration: Fair and Attention Span: Fair  Recall:  Poor  Fund of Knowledge:  Poor  Language:  Fair  Akathisia:  No  Handed:    AIMS (if indicated):     Assets:  Desire for Improvement Physical Health Social Support  ADL's:  Intact  Cognition:  Impaired,  Mild  Sleep:  Number of Hours: 7      Treatment Plan Summary: Daily contact with patient to assess and evaluate symptoms and progress in treatment and Medication management   Bipolar disorder type I. Patient will be reassessed started on Abilify 30 mg by mouth daily.I will also start her on lithium CR.  I will increase lithium to 450 mg po bid.    Insomnia the patient will be started on Ativan 2 mg by mouth daily at bedtime  Anxiety: I will order ativan 0.25 mg tid with meals.  Cardiovascular health the patient will be started on aspirin 81 mg a day  Dyslipidemia the patient will be continued on Crestor 20 mg a day and 1505 at 160 mg by mouth daily  Diabetes: I will change patient's diet to low carbohydrate. She will be started on Glucotrol 10 mg by mouth twice a day and tradjenta to 5 mg a day  Possible upper respiratory tract infection: I will order symptomatic treatment.  Tobacco use disorder I will order nicotine patch 21 mg a day  Hospitalization and status continue involuntary commitment   Precautions every 15 minute checks   Estimated length of stay 5-7 days  I certify that inpatient services furnished can reasonably be expected to improve the patient's condition.   Jimmy FootmanHernandez-Gonzalez,  Hidaya Daniel,  MD 10/15/2015, 5:15 PM

## 2015-10-15 NOTE — Plan of Care (Signed)
Problem: Coping: Goal: Ability to cope will improve Outcome: Not Progressing Patient not able to describe alternative coping skills as of this shift Tax adviser[place]own[place] RN

## 2015-10-15 NOTE — [facility] Group Notes (Signed)
[facility] Group Notes:  (Nursing/MHT/Case Management/Adjunct)  Date:  10/15/2015  Time:  4:32 PM  Type of Therapy:  Psychoeducational Skills  Participation Level:  Active  Participation Quality:  Appropriate, Attentive and Sharing  Affect:  Appropriate  Cognitive:  Alert and Appropriate  Insight:  Appropriate  Engagement in Group:  Engaged  Modes of Intervention:  Discussion, Education and Support  Summary of Progress/Problems:  Julie SmokeCara Morrow Julie Morrow 10/15/2015, 4:32 PM

## 2015-10-15 NOTE — Progress Notes (Signed)
Affect flat.  Rates depression, anxiety and hopelessness as a 10.  Denies SI/HI/AVH.  Isolates to room. Unable to verbalize what she can do to help herself with her depression or anxiety.  Support and encouragement offered.  Encouraged group attendance.  Safety maintained.

## 2015-10-16 LAB — GLUCOSE, CAPILLARY: Glucose-Capillary: 133 mg/dL — ABNORMAL HIGH (ref 65–99)

## 2015-10-16 LAB — VITAMIN B12: Vitamin B-12: 255 pg/mL (ref 180–914)

## 2015-10-16 NOTE — Progress Notes (Signed)
Recreation Therapy Notes  Date: 06.01.17 Time: 9:30 am Location: Craft Room  Group Topic: Leisure Education  Goal Area(s) Addresses:  Patient will identify activities for each letter of the alphabet. Patient will verbalize emotion experienced from leisure activities.  Behavioral Response: Did not attend  Intervention: Leisure Alphabet  Activity: Patients were given a Leisure Alphabet and instructed to write a healthy leisure activity for each letter of the alphabet.  Education: LRT educated patients on what they need to participate in leisure.  Education Outcome: Patient did not attend group.   Clinical Observations/Feedback: Patient did not attend group.  Jacquelynn CreeGreene,Sy Saintjean M, LRT/CTRS 10/16/2015 10:17 AM

## 2015-10-16 NOTE — Progress Notes (Signed)
D: [name] denies SI/HI/AVH. She is med compliant but hasn't participated in unit activities, including groups, this a.m. She rates her anxiety, sense of hopelessness, and depression 10/10. On her self inventory, she reported poor sleep, poor appetite, low energy level, and poor concentration. A: Meds given as ordered. Q15 safety checks maintained. Support/encouragement offered. Urged her to be present in dayroom and attend groups.  R: Pt remains free from harm and continues with treatment. Will continue to monitor for needs/safety.

## 2015-10-16 NOTE — [facility] Group Notes (Signed)
[facility] Group Notes:  (Nursing/MHT/Case Management/Adjunct)  Date:  10/16/2015  Time:  1:56 AM  Type of Therapy:  Group Therapy  Participation Level:  Did Not Attend   Summary of Progress/Problems:  Julie Morrow 10/16/2015, 1:56 AM

## 2015-10-16 NOTE — Progress Notes (Addendum)
[facility] MD Progress Note  10/16/2015 4:03 PM Julie Morrow  MRN:  [id number] Subjective:  Patient tells me she has not been compliant with medications prior to coming in. She tells me she feels short of breath and says that she was on antibiotics prior to coming in. Per emergency room note from the physical examination did not show any signs of lung disease. There is also a chest  x-ray done that day that was within the normal limits.  Per nursing staff the patient has been somatic.  Patient tells me she does not feel well. He says the medications that she is taking brain are not helping her. She says that the medication she was on back in February did help her. I reviewed the medications and looks like she has been restarted on most of them.  Patient admited that she is somewhat stressed out because her daughter is getting married in May move further away from her.  She has not told this to anyone else yet.  This may be a source of her recent decrease in mooD: Patient appears very depressed. Still rates her depression, anxiety, and hopelessness 10/10. Denies SI/HI/AVH.    Pt reports no improvement at all since admission.  Mood is depressed, appetite, energy and concentration are poor.  Says she was able to sleep better last nigh with ativan.   During assessment she is dishevel, has a strong body odor, presents with psychomotor retardation.  Per nursing: D: Julie Morrow denies SI/HI/AVH. She is med compliant but hasn't participated in unit activities, including groups, this a.m. She rates her anxiety, sense of hopelessness, and depression 10/10. On her self inventory, she reported poor sleep, poor appetite, low energy level, and poor concentration. A: Meds given as ordered. Q15 safety checks maintained. Support/encouragement offered. Urged her to be present in dayroom and attend groups.  R: Pt remains free from harm and continues with treatment. Will continue to monitor for needs/safety.        Principal Problem: Bipolar 1 disorder, mixed, severe (HCC) Diagnosis:   Patient Active Problem List   Diagnosis Date Noted  . GERD (gastroesophageal reflux disease) [K21.9] 10/15/2015  . Bipolar 1 disorder, mixed, severe (HCC) [F31.63] 10/11/2015  . Diabetes type 2, controlled (HCC) [E11.9] 07/07/2015  . Obesity [E66.9] 07/07/2015  . Tobacco use disorder [F17.200] 07/07/2015  . Hypothyroidism [E03.9] 07/07/2015  . HLD (hyperlipidemia) [E78.5] 11/07/2014  . Psoriasis [L40.9] 11/07/2014   Total Time spent with patient: 30 minutes  Past Psychiatric History: bipolar disorder  Past Medical History:  Past Medical History  Diagnosis Date  . Hypothyroidism   . Depression   . Anxiety   . Diabetes mellitus, type II (HCC)     Patient takes Glucotrol and Januvia  . H/O diabetes mellitus 04/08/2015    borderline diabetes no medications needed.   . Bipolar 1 disorder [facility])     Past Surgical History  Procedure Laterality Date  . Abdominal hysterectomy     Family History:  Family History  Problem Relation Age of Onset  . Dementia Mother   . Arthritis Father    Family Psychiatric  History: Patient denies Social History:  History  Alcohol Use No     History  Drug Use No    Social History   Social History  . Marital Status: Single    Spouse Name: N/A  . Number of Children: N/A  . Years of Education: N/A   Social History Main Topics  . Smoking status: Former Smoker --  0.25 packs/day for 5 years    Types: Cigarettes    Start date: 11/06/1984  . Smokeless tobacco: Never Used  . Alcohol Use: No  . Drug Use: No  . Sexual Activity: No   Other Topics Concern  . None   Social History Narrative    Current Medications: Current Facility-Administered Medications  Medication Dose Route Frequency Provider Last Rate Last Dose  . acetaminophen (TYLENOL) tablet 650 mg  650 mg Oral Q6H PRN Brandy Hale, MD   650 mg at 10/12/15 2222  . alum & mag hydroxide-simeth  (MAALOX/MYLANTA) 200-200-20 MG/5ML suspension 30 mL  30 mL Oral Q4H PRN Brandy Hale, MD      . ARIPiprazole (ABILIFY) tablet 30 mg  30 mg Oral Q breakfast Jimmy Footman, MD   30 mg at 10/16/15 0805  . aspirin chewable tablet 81 mg  81 mg Oral Q breakfast Jimmy Footman, MD   81 mg at 10/16/15 0805  . fenofibrate tablet 160 mg  160 mg Oral Q breakfast Jimmy Footman, MD   160 mg at 10/16/15 0805  . glipiZIDE (GLUCOTROL) tablet 10 mg  10 mg Oral BID AC Brandy Hale, MD   10 mg at 10/16/15 0650  . guaiFENesin-dextromethorphan (ROBITUSSIN DM) 100-10 MG/5ML syrup 5 mL  5 mL Oral Q4H PRN Jimmy Footman, MD      . levothyroxine (SYNTHROID, LEVOTHROID) tablet 200 mcg  200 mcg Oral QAC breakfast Jimmy Footman, MD   200 mcg at 10/16/15 0651  . linagliptin (TRADJENTA) tablet 5 mg  5 mg Oral Q breakfast Jimmy Footman, MD   5 mg at 10/16/15 0805  . lithium carbonate (ESKALITH) CR tablet 450 mg  450 mg Oral Q12H Jimmy Footman, MD   450 mg at 10/16/15 0981  . LORazepam (ATIVAN) tablet 0.25 mg  0.25 mg Oral TID WC Jimmy Footman, MD   0.25 mg at 10/16/15 1154  . LORazepam (ATIVAN) tablet 2 mg  2 mg Oral QHS Jimmy Footman, MD   2 mg at 10/15/15 2129  . magnesium hydroxide (MILK OF MAGNESIA) suspension 30 mL  30 mL Oral Daily PRN Brandy Hale, MD      . nicotine (NICODERM CQ - dosed in mg/24 hours) patch 14 mg  14 mg Transdermal Daily Jimmy Footman, MD   14 mg at 10/16/15 1000  . rosuvastatin (CRESTOR) tablet 20 mg  20 mg Oral q1800 Jimmy Footman, MD   20 mg at 10/15/15 1811  . sodium chloride (OCEAN) 0.65 % nasal spray 1 spray  1 spray Each Nare PRN Lockie Pares, MD        Lab Results:  Results for orders placed or performed during the hospital encounter of 10/11/15 (from the past 48 hour(s))  Glucose, capillary     Status: Abnormal   Collection Time: 10/15/15  6:46 AM  Result Value Ref  Range   Glucose-Capillary 141 (H) 65 - 99 mg/dL  Glucose, capillary     Status: Abnormal   Collection Time: 10/16/15  6:54 AM  Result Value Ref Range   Glucose-Capillary 133 (H) 65 - 99 mg/dL    Blood Alcohol level:  Lab Results  Component Value Date   ETH <5 10/10/2015   ETH <5 06/30/2015    Physical Findings: AIMS: Facial and Oral Movements Muscles of Facial Expression: None, normal Lips and Perioral Area: None, normal Jaw: None, normal Tongue: None, normal,Extremity Movements Upper (arms, wrists, hands, fingers): None, normal Lower (legs, knees, ankles, toes): None, normal, Trunk Movements Neck,  shoulders, hips: None, normal, Overall Severity Severity of abnormal movements (highest score from questions above): None, normal Incapacitation due to abnormal movements: None, normal Patient's awareness of abnormal movements (rate only patient's report): No Awareness, Dental Status Current problems with teeth and/or dentures?: No Does patient usually wear dentures?: No  CIWA:  CIWA-Ar Total: 0 COWS:  COWS Total Score: 0  Musculoskeletal: Strength & Muscle Tone: within normal limits Gait & Station: unsteady Patient leans: N/A  Psychiatric Specialty Exam: Physical Exam  Constitutional: She is oriented to person, place, and time. She appears well-developed and well-nourished.  HENT:  Head: Atraumatic.  Eyes: EOM are normal.  Neck: Normal range of motion.  Respiratory: Effort normal.  Musculoskeletal: Normal range of motion.  Neurological: She is alert and oriented to person, place, and time.    Review of Systems  Constitutional: Negative.   HENT: Negative.   Eyes: Negative.   Respiratory: Negative.   Cardiovascular: Negative.   Gastrointestinal: Negative.   Genitourinary: Negative.   Musculoskeletal: Negative.   Skin: Negative.   Neurological: Negative.   Endo/Heme/Allergies: Negative.   Psychiatric/Behavioral: Negative.     Blood pressure 118/81, pulse 91,  temperature 97.8 F (36.6 C), temperature source Oral, resp. rate 20, height 5\' 3"  (1.6 m), weight 122.018 kg (269 lb), SpO2 98 %.Body mass index is 47.66 kg/(m^2).  General Appearance: Disheveled  Eye Contact:  Fair  Speech:  Slow  Volume:  Decreased  Mood:  Depressed, Dysphoric, Hopeless, Irritable and Worthless  Affect:  Depressed and Flat  Thought Process:  Linear  Orientation:  Full (Time, Place, and Person)  Thought Content:  Rumination  Denies hallucination  Suicidal Thoughts:  No  Homicidal Thoughts:  No  Memory:  Immediate;   Poor Recent;   Poor Remote;   Fair  Judgement:  Poor  Insight:  Lacking  Psychomotor Activity:  Decreased  Concentration:  Concentration: Fair and Attention Span: Fair  Recall:  Poor  Fund of Knowledge:  Poor  Language:  Fair  Akathisia:  No  Handed:    AIMS (if indicated):     Assets:  Desire for Improvement Physical Health Social Support  ADL's:  Intact  Cognition:  Impaired,  Mild  Sleep:  Number of Hours: 7.45      Treatment Plan Summary: Daily contact with patient to assess and evaluate symptoms and progress in treatment and Medication management   Bipolar disorder type I: continue abilify 30 mg and lithium CR 450 mg po bid. Will need lithium level Monday am.  Insomnia: continue Ativan 2 mg by mouth daily at bedtime  Anxiety: continue ativan 0.25 mg tid with meals.  Hypothyroidism: TSH highly elevated due to non compliance.  During last admission a few months ago she was seeing by endocrinology which increased synthroid to 200mcg.  Cardiovascular health: continue aspirin 81 mg a day  Dyslipidemia the patient will be continued on Crestor 20 mg a day and 1505 at 160 mg by mouth daily  Diabetes: I will change patient's diet to low carbohydrate. She will be started on Glucotrol 10 mg by mouth twice a day and tradjenta to 5 mg a day  Possible upper respiratory tract infection: I will order symptomatic treatment.  Tobacco use  disorder: continue nicotine patch 21 mg a day  Hospitalization and status continue involuntary commitment   Precautions every 15 minute checks   Estimated length of stay 7 days  I certify that inpatient services furnished can reasonably be expected to improve the patient's condition.  Jimmy Footman, MD 10/16/2015, 4:03 PM

## 2015-10-17 LAB — GLUCOSE, CAPILLARY
Glucose-Capillary: 79 mg/dL (ref 65–99)
Glucose-Capillary: 113 mg/dL — ABNORMAL HIGH (ref 65–99)
Glucose-Capillary: 65 mg/dL (ref 65–99)

## 2015-10-17 MED ORDER — FLUTICASONE PROPIONATE 50 MCG/ACT NA SUSP
2.0000 | Freq: Every day | NASAL | Status: AC
  Administered 2015-10-17 – 2015-10-19 (×3): 2 via NASAL
  Filled 2015-10-17 (×2): qty 16

## 2015-10-17 NOTE — Progress Notes (Signed)
Recreation Therapy Notes  Date: 06.02.17 Time: 9:30 am Location: Craft Room  Group Topic: Coping Skills  Goal Area(s) Addresses:  Patient will participate in healthy coping skill. Patient will verbalize benefit of using art as a coping skill.  Behavioral Response: Did not attend  Intervention: Coloring  Activity: Patients were instructed to color and to think about the emotions they were feeling as well as what they were focused on while they were coloring.  Education: LRT educated patients on healthy coping skills.  Education Outcome: Patient did not attend group.  Clinical Observations/Feedback: Patient did not attend group.  Alois Colgan M, LRT/CTRS 10/17/2015 10:22 AM 

## 2015-10-17 NOTE — [facility] Group Notes (Signed)
  [facility] LCSW Group Therapy  10/17/2015 1:51 PM  Type of Therapy:  Group Therapy  Participation Level:  Did Not Attend  Modes of Intervention:  Discussion, Education, Socialization and Support  Summary of Progress/Problems: Feelings around Relapse. Group members discussed the meaning of relapse and shared personal stories of relapse, how it affected them and others, and how they perceived themselves during this time. Group members were encouraged to identify triggers, warning signs and coping skills used when facing the possibility of relapse. Social supports were discussed and explored in detail.   Jahaad Penado L Oluwadara Gorman MSW, LCSWA  10/17/2015, 1:51 PM  

## 2015-10-17 NOTE — Progress Notes (Signed)
Hypoglycemic Event  CBG: 65  Treatment: 15 GM carbohydrate snack  Symptoms: None  Follow-up CBG: Time:1655 CBG Result:  65  Possible Reasons for Event: Unknown  Comments/MD notified:  Dr. Ledell Peopleslapacs    Julie Morrow Julie Morrow

## 2015-10-17 NOTE — Tx Team (Signed)
Interdisciplinary Treatment Plan Update (Adult)  Date:  10/17/2015 Time Reviewed:  3:22 PM  Progress in Treatment: Attending groups: Yes. Participating in groups:  Yes. Taking medication as prescribed:  Yes. Tolerating medication:  Yes. Family/Significant othe contact made:  No, will contact:  not yet  Patient understands diagnosis:  No. and As evidenced by:  limted insight  Discussing patient identified problems/goals with staff:  Yes. Medical problems stabilized or resolved:  Yes. Denies suicidal/homicidal ideation: Yes. Issues/concerns per patient self-inventory:  Yes. Other:  New problem(s) identified: No, Describe:  NA  Discharge Plan or Barriers: Pt plans to return home and follow up with outpatient.    Reason for Continuation of Hospitalization: Depression Medication stabilization  Comments:Patient states that she has been feeling poorly for a few weeks now. She feels that her medications were working ok but she left two medications (she can not remember which ones) on the table when she went to her daughters house and didn't take those pills.  She feels "bad bad bad." She seem to have difficulty explaining her situation in detail and states her life is generally "great" though she is very depressed. She has been having racing thoughts , decreased appetite and insomnia. Her mind races and can't fall asleep or stay asleep. Gets 2-3 hours of sleep a night but used to get 6 hours. Mood is "worried, sad, distraught wondering if I will ever get over this. Last time I was here the medications were fine." She feels her life is "perfectly fine" and she has good people in her life. She used to spend her days cleaning and cooking but now just lays in bed. She lives with her father, is on disability for bipolar. She is alert and oriented but when asked her age she initially said "I don't know... Let's see, I was born in [birth year]..." She was able to get her age correct when asked again. Patient  appears depressed, has mostly vague responses and poor insight.   Estimated length of stay: 3 days   New goal(s): NA  Review of initial/current patient goals per problem list:   1.  Goal(s): Patient will participate in aftercare plan * Met:  * Target date: at discharge * As evidenced by: Patient will participate within aftercare plan AEB aftercare provider and housing plan at discharge being identified.   2.  Goal (s): Patient will exhibit decreased depressive symptoms and suicidal ideations. * Met:  *  Target date: at discharge * As evidenced by: Patient will utilize self rating of depression at 3 or below and demonstrate decreased signs of depression or be deemed stable for discharge by MD.  Attendees: Patient:  Julie Morrow 6/2/20173:22 PM  Family:   6/2/20173:22 PM  Physician:  Dr. Jerilee Hoh   6/2/20173:22 PM  Nursing:   Terressa Koyanagi, RN  6/2/20173:22 PM  Case Manager:   6/2/20173:22 PM  Counselor:   6/2/20173:22 PM  Other:  [place] 6/2/20173:22 PM  Other:  Everitt Amber, [place] 6/2/20173:22 PM  Other:   6/2/20173:22 PM  Other:  6/2/20173:22 PM  Other:  6/2/20173:22 PM  Other:  6/2/20173:22 PM  Other:  6/2/20173:22 PM  Other:  6/2/20173:22 PM  Other:  6/2/20173:22 PM  Other:   6/2/20173:22 PM   Scribe for Treatment Team:   Wray Kearns, MSW, LCSWA  10/17/2015, 3:22 PM

## 2015-10-17 NOTE — Progress Notes (Signed)
[facility]  10/17/2015 12:01 PM Arther DamesSusan B Overacker  MRN:  [id number] Subjective:  Patient tells me she has not been compliant with medications prior to coming in. She tells me she feels short of breath and says that she was on antibiotics prior to coming in. Per emergency room note from the physical examination did not show any signs of lung disease. There is also a chest  x-ray done that day that was within the normal limits.  Per nursing staff the patient has been somatic.  Patient tells me she does not feel well. He says the medications that she is taking brain are not helping her. She says that the medication she was on back in February did help her. I reviewed the medications and looks like she has been restarted on most of them.  Patient admited that she is somewhat stressed out because her daughter is getting married in May move further away from her.  She has not told this to anyone else yet.  This may be a source of her recent decrease in mood   Pt reports mild improvement since admission.  Mood is a little less depressed, appetite, energy and concentration are still poor.  Says she was able to sleep better last nigh with ativan. Patient denies side effects from medications. Denies any physical complaints. Denies suicidality, homicidality or auditory or visual hallucinations  During assessment she is dishevel, has a strong body odor, presents with psychomotor retardation.  Per nursing:  D: Pt denies SI/HI/AVH. Pt is pleasant and cooperative, affect is flat ad sad. Patient appears less anxious, minimal interacting with peers and staff. Patient appears disheavled with a strong body odor, encouraged to take a bath. Patient rated depression a 6/10 will continue to monitor.  A: Pt was offered support and encouragement. Pt was given scheduled medications. Pt was encouraged to attend groups. Q 15 minute checks were done for safety.  R:Pt did not attend group. Pt is complaint with  medication. Pt has no complaints.Pt receptive to treatment and safety maintained on unit.      Principal Problem: Bipolar 1 disorder, mixed, severe (HCC) Diagnosis:   Patient Active Problem List   Diagnosis Date Noted  . GERD (gastroesophageal reflux disease) [K21.9] 10/15/2015  . Bipolar 1 disorder, mixed, severe (HCC) [F31.63] 10/11/2015  . Diabetes type 2, controlled (HCC) [E11.9] 07/07/2015  . Obesity [E66.9] 07/07/2015  . Tobacco use disorder [F17.200] 07/07/2015  . Hypothyroidism [E03.9] 07/07/2015  . HLD (hyperlipidemia) [E78.5] 11/07/2014  . Psoriasis [L40.9] 11/07/2014   Total Time spent with patient: 30 minutes  Past Psychiatric History: bipolar disorder  Past Medical History:  Past Medical History  Diagnosis Date  . Hypothyroidism   . Depression   . Anxiety   . Diabetes mellitus, type II (HCC)     Patient takes Glucotrol and Januvia  . H/O diabetes mellitus 04/08/2015    borderline diabetes no medications needed.   . Bipolar 1 disorder [facility](HCC)     Past Surgical History  Procedure Laterality Date  . Abdominal hysterectomy     Family History:  Family History  Problem Relation Age of Onset  . Dementia Mother   . Arthritis Father    Family Psychiatric  History: Patient denies Social History:  History  Alcohol Use No     History  Drug Use No    Social History   Social History  . Marital Status: Single    Spouse Name: N/A  . Number of Children: N/A  .  Years of Education: N/A   Social History Main Topics  . Smoking status: Former Smoker -- 0.25 packs/day for 5 years    Types: Cigarettes    Start date: 11/06/1984  . Smokeless tobacco: Never Used  . Alcohol Use: No  . Drug Use: No  . Sexual Activity: No   Other Topics Concern  . None   Social History Narrative    Current Medications: Current Facility-Administered Medications  Medication Dose Route Frequency Provider Last Rate Last Dose  . acetaminophen (TYLENOL) tablet 650 mg  650 mg Oral  Q6H PRN Brandy Hale, MD   650 mg at 10/12/15 2222  . alum & mag hydroxide-simeth (MAALOX/MYLANTA) 200-200-20 MG/5ML suspension 30 mL  30 mL Oral Q4H PRN Brandy Hale, MD      . ARIPiprazole (ABILIFY) tablet 30 mg  30 mg Oral Q breakfast Jimmy Footman, MD   30 mg at 10/17/15 0845  . aspirin chewable tablet 81 mg  81 mg Oral Q breakfast Jimmy Footman, MD   81 mg at 10/17/15 0845  . fenofibrate tablet 160 mg  160 mg Oral Q breakfast Jimmy Footman, MD   160 mg at 10/17/15 0845  . glipiZIDE (GLUCOTROL) tablet 10 mg  10 mg Oral BID AC Brandy Hale, MD   10 mg at 10/17/15 0845  . guaiFENesin-dextromethorphan (ROBITUSSIN DM) 100-10 MG/5ML syrup 5 mL  5 mL Oral Q4H PRN Jimmy Footman, MD      . levothyroxine (SYNTHROID, LEVOTHROID) tablet 200 mcg  200 mcg Oral QAC breakfast Jimmy Footman, MD   200 mcg at 10/17/15 [phone]  . linagliptin (TRADJENTA) tablet 5 mg  5 mg Oral Q breakfast Jimmy Footman, MD   5 mg at 10/17/15 0845  . lithium carbonate (ESKALITH) CR tablet 450 mg  450 mg Oral Q12H Jimmy Footman, MD   450 mg at 10/17/15 0845  . LORazepam (ATIVAN) tablet 0.25 mg  0.25 mg Oral TID WC Jimmy Footman, MD   0.25 mg at 10/17/15 1159  . LORazepam (ATIVAN) tablet 2 mg  2 mg Oral QHS Jimmy Footman, MD   2 mg at 10/16/15 2138  . magnesium hydroxide (MILK OF MAGNESIA) suspension 30 mL  30 mL Oral Daily PRN Brandy Hale, MD      . nicotine (NICODERM CQ - dosed in mg/24 hours) patch 14 mg  14 mg Transdermal Daily Jimmy Footman, MD   14 mg at 10/17/15 0846  . rosuvastatin (CRESTOR) tablet 20 mg  20 mg Oral q1800 Jimmy Footman, MD   20 mg at 10/16/15 1638  . sodium chloride (OCEAN) 0.65 % nasal spray 1 spray  1 spray Each Nare PRN Lockie Pares, MD        Lab Results:  Results for orders placed or performed during the hospital encounter of 10/11/15 (from the past 48 hour(s))  Glucose,  capillary     Status: Abnormal   Collection Time: 10/16/15  6:54 AM  Result Value Ref Range   Glucose-Capillary 133 (H) 65 - 99 mg/dL  Vitamin R60     Status: None   Collection Time: 10/16/15  4:32 PM  Result Value Ref Range   Vitamin B-12 255 180 - 914 pg/mL    Comment: (NOTE) This assay is not validated for testing neonatal or myeloproliferative syndrome specimens for Vitamin B12 levels. Performed at [facility]   Glucose, capillary     Status: None   Collection Time: 10/17/15 11:39 AM  Result Value Ref Range   Glucose-Capillary 79  65 - 99 mg/dL    Blood Alcohol level:  Lab Results  Component Value Date   ETH <5 10/10/2015   ETH <5 06/30/2015    Physical Findings: AIMS: Facial and Oral Movements Muscles of Facial Expression: None, normal Lips and Perioral Area: None, normal Jaw: None, normal Tongue: None, normal,Extremity Movements Upper (arms, wrists, hands, fingers): None, normal Lower (legs, knees, ankles, toes): None, normal, Trunk Movements Neck, shoulders, hips: None, normal, Overall Severity Severity of abnormal movements (highest score from questions above): None, normal Incapacitation due to abnormal movements: None, normal Patient's awareness of abnormal movements (rate only patient's report): No Awareness, Dental Status Current problems with teeth and/or dentures?: No Does patient usually wear dentures?: No  CIWA:  CIWA-Ar Total: 0 COWS:  COWS Total Score: 0  Musculoskeletal: Strength & Muscle Tone: within normal limits Gait & Station: unsteady Patient leans: N/A  Psychiatric Specialty Exam: Physical Exam  Constitutional: She is oriented to person, place, and time. She appears well-developed and well-nourished.  HENT:  Head: Atraumatic.  Eyes: EOM are normal.  Neck: Normal range of motion.  Respiratory: Effort normal.  Musculoskeletal: Normal range of motion.  Neurological: She is alert and oriented to person, place, and time.    Review  of Systems  Constitutional: Negative.   HENT: Negative.   Eyes: Negative.   Respiratory: Negative.   Cardiovascular: Negative.   Gastrointestinal: Negative.   Genitourinary: Negative.   Musculoskeletal: Negative.   Skin: Negative.   Neurological: Negative.   Endo/Heme/Allergies: Negative.   Psychiatric/Behavioral: Negative.     Blood pressure 141/54, pulse 75, temperature 98.3 F (36.8 C), temperature source Oral, resp. rate 20, height 5\' 3"  (1.6 m), weight 122.018 kg (269 lb), SpO2 98 %.Body mass index is 47.66 kg/(m^2).  General Appearance: Disheveled  Eye Contact:  Fair  Speech:  Slow  Volume:  Decreased  Mood:  Depressed, Dysphoric, Hopeless, Irritable and Worthless  Affect:  Depressed and Flat  Thought Process:  Linear  Orientation:  Full (Time, Place, and Person)  Thought Content:  Rumination  Denies hallucination  Suicidal Thoughts:  No  Homicidal Thoughts:  No  Memory:  Immediate;   Poor Recent;   Poor Remote;   Fair  Judgement:  Poor  Insight:  Lacking  Psychomotor Activity:  Decreased  Concentration:  Concentration: Fair and Attention Span: Fair  Recall:  Poor  Fund of Knowledge:  Poor  Language:  Fair  Akathisia:  No  Handed:    AIMS (if indicated):     Assets:  Desire for Improvement Physical Health Social Support  ADL's:  Intact  Cognition:  Impaired,  Mild  Sleep:  Number of Hours: 6.75      Treatment Plan Summary: Daily contact with patient to assess and evaluate symptoms and progress in treatment and Medication management   Bipolar disorder type I: continue abilify 30 mg and lithium CR 450 mg po bid. Will need lithium level Monday am.  Improving  Insomnia: continue Ativan 2 mg by mouth daily at bedtime.  Slept 6.7 hours last night  Anxiety: continue ativan 0.25 mg tid with meals.  Hypothyroidism: TSH highly elevated due to non compliance.  During last admission a few months ago she was seeing by endocrinology which increased synthroid to  .  patient claims she was compliant with Synthroid at home  Cardiovascular health: continue aspirin 81 mg a day  Dyslipidemia the patient will be continued on Crestor 20 mg a day and 1505 at 160 mg by mouth  daily  Diabetes: I will change patient's diet to low carbohydrate. She will be started on Glucotrol 10 mg by mouth twice a day and tradjenta to 5 mg a day  Possible upper respiratory tract infection: I will order symptomatic treatment.  Tobacco use disorder: continue nicotine patch 21 mg a day  Hospitalization and status continue involuntary commitment   Precautions every 15 minute checks   Estimated length of stay 7 days  Labs I will order lithium level on Monday a.m.  I certify that inpatient services furnished can reasonably be expected to improve the patient's condition.   Jimmy Footman, MD 10/17/2015, 12:01 PM

## 2015-10-17 NOTE — [facility] Group Notes (Signed)
[facility] LCSW Group Therapy   10/17/2015 9:30 am  Type of Therapy: Group Therapy   Participation Level: Did Not Attend. Patient invited to participate but declined.    Harlowe Dowler F. Yudith Norlander, MSW, LCSWA, LCAS   

## 2015-10-17 NOTE — [facility] Group Notes (Signed)
[facility] Group Notes:  (Nursing/MHT/Case Management/Adjunct)  Date:  10/17/2015  Time:  4:17 PM  Type of Therapy:  Group Therapy  Participation Level:  Did Not Attend  Mirah Nevins De'Chelle Dorie Ohms 10/17/2015, 4:17 PM

## 2015-10-17 NOTE — Progress Notes (Signed)
D: Pt denies SI/HI/AVH. Pt is pleasant and cooperative, affect is flat ad sad. Patient appears less anxious, minimal  interacting with peers and staff.  Patient appears disheavled with a strong body odor, encouraged to take a bath. Patient rated depression a 6/10 will continue to monitor.   A: Pt was offered support and encouragement. Pt was given scheduled medications. Pt was encouraged to attend groups. Q 15 minute checks were done for safety.  R:Pt did not attend group. Pt is complaint with medication. Pt has no complaints.Pt receptive to treatment and safety maintained on unit.

## 2015-10-17 NOTE — Progress Notes (Signed)
D:  Per pt self inventory pt reports sleeping fair, appetite fair, energy level normal, ability to pay attention good, rates depression at a 5 out of 10, hopelessness at a 5 out of 10, anxiety at a 5 out of 10, denies SI/HI/AVH, goal today: "not let depression and anxiety get in my way, discharge", depressed, pleasant, appropriate during interaction.     A:  Emotional support provided, Encouraged pt to continue with treatment plan and attend all group activities, q15 min checks maintained for safety.  R:  Pt is receptive, going to some groups, pleasant and cooperative with staff and other patients on the unit.

## 2015-10-18 LAB — GLUCOSE, CAPILLARY
Glucose-Capillary: 172 mg/dL — ABNORMAL HIGH (ref 65–99)
Glucose-Capillary: 63 mg/dL — ABNORMAL LOW (ref 65–99)

## 2015-10-18 NOTE — [facility] Group Notes (Signed)
[facility] LCSW Group Therapy  10/18/2015 1:37 PM  Type of Therapy:  Group Therapy  Participation Level:  Did Not Attend  Modes of Intervention:  Discussion, Education, Socialization and Support  Summary of Progress/Problems: Pt will identify unhealthy thoughts and how they impact their emotions and behavior. Pt will be encouraged to discuss these thoughts, emotions and behaviors with the group.   Elzia Hott L Giordan Fordham MSW, LCSWA  10/18/2015, 1:37 PM   

## 2015-10-18 NOTE — [facility] Group Notes (Signed)
[facility] Group Notes:  (Nursing/MHT/Case Management/Adjunct)  Date:  10/18/2015  Time:  9:42 AM  Type of Therapy:  Goal setting   Participation Level:  Did Not Attend  Julie Morrow 10/18/2015, 9:42 AM

## 2015-10-18 NOTE — Plan of Care (Signed)
Problem: Coping: Goal: Ability to cope will improve Outcome: Progressing Patient able to discuss coping mechanisms when asked this shift [place] adviser[place] RN

## 2015-10-18 NOTE — Progress Notes (Signed)
D:  Affect flat, continues to endorse depression.  Denies SI/HI/AVH.  Isolates to room except for meals.  Noted to come out periodically to talk on the telephone.  No interaction with peers.  No forthcoming with staff.  No group attendance.   A:  Medication given according to orders.  Support and encouragement offered.  Safety maintained.  R.  Medication compliant.

## 2015-10-18 NOTE — Progress Notes (Signed)
[facility] MD Progress Note  10/18/2015 6:45 PM Julie Morrow  MRN:  [id number] Subjective:   Follow-up Saturday the third. Patient complains of feeling weak today. Not eating very well. Feels run down. Looks tired. Poorly groomed. Denies suicidal ideation. Doesn't appear to have much insight. Her dangerous behavior.   Patient tells me she has not been compliant with medications prior to coming in. She tells me she feels short of breath and says that she was on antibiotics prior to coming in. Per emergency room note from the physical examination did not show any signs of lung disease. There is also a chest  x-ray done that day that was within the normal limits.  Per nursing staff the patient has been somatic.  Patient tells me she does not feel well. He says the medications that she is taking brain are not helping her. She says that the medication she was on back in February did help her. I reviewed the medications and looks like she has been restarted on most of them.  Patient admited that she is somewhat stressed out because her daughter is getting married in May move further away from her.  She has not told this to anyone else yet.  This may be a source of her recent decrease in mood   Pt reports mild improvement since admission.  Mood is a little less depressed, appetite, energy and concentration are still poor.  Says she was able to sleep better last nigh with ativan. Patient denies side effects from medications. Denies any physical complaints. Denies suicidality, homicidality or auditory or visual hallucinations  During assessment she is dishevel, has a strong body odor, presents with psychomotor retardation.  Per nursing:  D: Pt denies SI/HI/AVH. Pt is pleasant and cooperative, affect is flat ad sad. Patient appears less anxious, minimal interacting with peers and staff. Patient appears disheavled with a strong body odor, encouraged to take a bath. Patient rated depression a 6/10 will continue  to monitor.  A: Pt was offered support and encouragement. Pt was given scheduled medications. Pt was encouraged to attend groups. Q 15 minute checks were done for safety.  R:Pt did not attend group. Pt is complaint with medication. Pt has no complaints.Pt receptive to treatment and safety maintained on unit.      Principal Problem: Bipolar 1 disorder, mixed, severe (HCC) Diagnosis:   Patient Active Problem List   Diagnosis Date Noted  . GERD (gastroesophageal reflux disease) [K21.9] 10/15/2015  . Bipolar 1 disorder, mixed, severe (HCC) [F31.63] 10/11/2015  . Diabetes type 2, controlled (HCC) [E11.9] 07/07/2015  . Obesity [E66.9] 07/07/2015  . Tobacco use disorder [F17.200] 07/07/2015  . Hypothyroidism [E03.9] 07/07/2015  . HLD (hyperlipidemia) [E78.5] 11/07/2014  . Psoriasis [L40.9] 11/07/2014   Total Time spent with patient: 30 minutes  Past Psychiatric History: bipolar disorder  Past Medical History:  Past Medical History  Diagnosis Date  . Hypothyroidism   . Depression   . Anxiety   . Diabetes mellitus, type II (HCC)     Patient takes Glucotrol and Januvia  . H/O diabetes mellitus 04/08/2015    borderline diabetes no medications needed.   . Bipolar 1 disorder [facility])     Past Surgical History  Procedure Laterality Date  . Abdominal hysterectomy     Family History:  Family History  Problem Relation Age of Onset  . Dementia Mother   . Arthritis Father    Family Psychiatric  History: Patient denies Social History:  History  Alcohol Use  No     History  Drug Use No    Social History   Social History  . Marital Status: Single    Spouse Name: N/A  . Number of Children: N/A  . Years of Education: N/A   Social History Main Topics  . Smoking status: Former Smoker -- 0.25 packs/day for 5 years    Types: Cigarettes    Start date: 11/06/1984  . Smokeless tobacco: Never Used  . Alcohol Use: No  . Drug Use: No  . Sexual Activity: No   Other Topics Concern   . None   Social History Narrative    Current Medications: Current Facility-Administered Medications  Medication Dose Route Frequency Provider Last Rate Last Dose  . acetaminophen (TYLENOL) tablet 650 mg  650 mg Oral Q6H PRN Brandy HaleUzma Faheem, MD   650 mg at 10/12/15 2222  . alum & mag hydroxide-simeth (MAALOX/MYLANTA) 200-200-20 MG/5ML suspension 30 mL  30 mL Oral Q4H PRN Brandy HaleUzma Faheem, MD      . ARIPiprazole (ABILIFY) tablet 30 mg  30 mg Oral Q breakfast Jimmy FootmanAndrea Hernandez-Gonzalez, MD   30 mg at 10/18/15 0849  . aspirin chewable tablet 81 mg  81 mg Oral Q breakfast Jimmy FootmanAndrea Hernandez-Gonzalez, MD   81 mg at 10/18/15 0848  . fenofibrate tablet 160 mg  160 mg Oral Q breakfast Jimmy FootmanAndrea Hernandez-Gonzalez, MD   160 mg at 10/18/15 0848  . fluticasone (FLONASE) 50 MCG/ACT nasal spray 2 spray  2 spray Each Nare Daily Jimmy FootmanAndrea Hernandez-Gonzalez, MD   2 spray at 10/18/15 1241  . glipiZIDE (GLUCOTROL) tablet 10 mg  10 mg Oral BID AC Brandy HaleUzma Faheem, MD   10 mg at 10/18/15 1714  . guaiFENesin-dextromethorphan (ROBITUSSIN DM) 100-10 MG/5ML syrup 5 mL  5 mL Oral Q4H PRN Jimmy FootmanAndrea Hernandez-Gonzalez, MD      . levothyroxine (SYNTHROID, LEVOTHROID) tablet 200 mcg  200 mcg Oral QAC breakfast Jimmy FootmanAndrea Hernandez-Gonzalez, MD   200 mcg at 10/18/15 [phone]  . linagliptin (TRADJENTA) tablet 5 mg  5 mg Oral Q breakfast Jimmy FootmanAndrea Hernandez-Gonzalez, MD   5 mg at 10/18/15 0848  . lithium carbonate (ESKALITH) CR tablet 450 mg  450 mg Oral Q12H Jimmy FootmanAndrea Hernandez-Gonzalez, MD   450 mg at 10/18/15 0849  . LORazepam (ATIVAN) tablet 0.25 mg  0.25 mg Oral TID WC Jimmy FootmanAndrea Hernandez-Gonzalez, MD   0.25 mg at 10/18/15 1714  . LORazepam (ATIVAN) tablet 2 mg  2 mg Oral QHS Jimmy FootmanAndrea Hernandez-Gonzalez, MD   2 mg at 10/17/15 2049  . magnesium hydroxide (MILK OF MAGNESIA) suspension 30 mL  30 mL Oral Daily PRN Brandy HaleUzma Faheem, MD      . nicotine (NICODERM CQ - dosed in mg/24 hours) patch 14 mg  14 mg Transdermal Daily Jimmy FootmanAndrea Hernandez-Gonzalez, MD   14 mg at 10/18/15  0849  . rosuvastatin (CRESTOR) tablet 20 mg  20 mg Oral q1800 Jimmy FootmanAndrea Hernandez-Gonzalez, MD   20 mg at 10/18/15 1715  . sodium chloride (OCEAN) 0.65 % nasal spray 1 spray  1 spray Each Nare PRN Lockie Paresiffani L Bell, MD        Lab Results:  Results for orders placed or performed during the hospital encounter of 10/11/15 (from the past 48 hour(s))  Glucose, capillary     Status: None   Collection Time: 10/17/15 11:39 AM  Result Value Ref Range   Glucose-Capillary 79 65 - 99 mg/dL  Glucose, capillary     Status: None   Collection Time: 10/17/15  4:33 PM  Result Value Ref  Range   Glucose-Capillary 65 65 - 99 mg/dL  Glucose, capillary     Status: Abnormal   Collection Time: 10/17/15  5:26 PM  Result Value Ref Range   Glucose-Capillary 113 (H) 65 - 99 mg/dL  Glucose, capillary     Status: Abnormal   Collection Time: 10/18/15  6:56 AM  Result Value Ref Range   Glucose-Capillary 172 (H) 65 - 99 mg/dL  Glucose, capillary     Status: Abnormal   Collection Time: 10/18/15  4:45 PM  Result Value Ref Range   Glucose-Capillary 63 (L) 65 - 99 mg/dL   Comment 1 Notify RN     Blood Alcohol level:  Lab Results  Component Value Date   ETH <5 10/10/2015   ETH <5 06/30/2015    Physical Findings: AIMS: Facial and Oral Movements Muscles of Facial Expression: None, normal Lips and Perioral Area: None, normal Jaw: None, normal Tongue: None, normal,Extremity Movements Upper (arms, wrists, hands, fingers): None, normal Lower (legs, knees, ankles, toes): None, normal, Trunk Movements Neck, shoulders, hips: None, normal, Overall Severity Severity of abnormal movements (highest score from questions above): None, normal Incapacitation due to abnormal movements: None, normal Patient's awareness of abnormal movements (rate only patient's report): No Awareness, Dental Status Current problems with teeth and/or dentures?: No Does patient usually wear dentures?: No  CIWA:  CIWA-Ar Total: 0 COWS:  COWS Total  Score: 0  Musculoskeletal: Strength & Muscle Tone: within normal limits Gait & Station: unsteady Patient leans: N/A  Psychiatric Specialty Exam: Physical Exam  Constitutional: She is oriented to person, place, and time. She appears well-developed and well-nourished.  HENT:  Head: Atraumatic.  Eyes: EOM are normal.  Neck: Normal range of motion.  Respiratory: Effort normal.  Musculoskeletal: Normal range of motion.  Neurological: She is alert and oriented to person, place, and time.    Review of Systems  Constitutional: Negative.   HENT: Negative.   Eyes: Negative.   Respiratory: Negative.   Cardiovascular: Negative.   Gastrointestinal: Negative.   Genitourinary: Negative.   Musculoskeletal: Negative.   Skin: Negative.   Neurological: Negative.   Endo/Heme/Allergies: Negative.   Psychiatric/Behavioral: Negative.     Blood pressure 141/62, pulse 79, temperature 99.1 F (37.3 C), temperature source Oral, resp. rate 20, height 5\' 3"  (1.6 m), weight 122.018 kg (269 lb), SpO2 98 %.Body mass index is 47.66 kg/(m^2).  General Appearance: Disheveled  Eye Contact:  Fair  Speech:  Slow  Volume:  Decreased  Mood:  Depressed, Dysphoric, Hopeless, Irritable and Worthless  Affect:  Depressed and Flat  Thought Process:  Linear  Orientation:  Full (Time, Place, and Person)  Thought Content:  Rumination  Denies hallucination  Suicidal Thoughts:  No  Homicidal Thoughts:  No  Memory:  Immediate;   Poor Recent;   Poor Remote;   Fair  Judgement:  Poor  Insight:  Lacking  Psychomotor Activity:  Decreased  Concentration:  Concentration: Fair and Attention Span: Fair  Recall:  Poor  Fund of Knowledge:  Poor  Language:  Fair  Akathisia:  No  Handed:    AIMS (if indicated):     Assets:  Desire for Improvement Physical Health Social Support  ADL's:  Intact  Cognition:  Impaired,  Mild  Sleep:  Number of Hours: 7.45      Treatment Plan Summary: Daily contact with patient to  assess and evaluate symptoms and progress in treatment and Medication management   Bipolar disorder type I: continue abilify 30 mg and lithium  CR 450 mg po bid. Will need lithium level Monday am.  Improving  Insomnia: continue Ativan 2 mg by mouth daily at bedtime.  Slept 6.7 hours last night  Anxiety: continue ativan 0.25 mg tid with meals.  Hypothyroidism: TSH highly elevated due to non compliance.  During last admission a few months ago she was seeing by endocrinology which increased synthroid to .  patient claims she was compliant with Synthroid at home  Cardiovascular health: continue aspirin 81 mg a day  Dyslipidemia the patient will be continued on Crestor 20 mg a day and 1505 at 160 mg by mouth daily  Diabetes: I will change patient's diet to low carbohydrate. She will be started on Glucotrol 10 mg by mouth twice a day and tradjenta to 5 mg a day  Possible upper respiratory tract infection: I will order symptomatic treatment.  Tobacco use disorder: continue nicotine patch 21 mg a day  Hospitalization and status continue involuntary commitment   Precautions every 15 minute checks   Estimated length of stay 7 days  Labs I will order lithium level on Monday a.m.  Await labs on Monday. No change to treatment plan today. Encourage patient to continue with attending groups and coming out of her room. I certify that inpatient services furnished can reasonably be expected to improve the patient's condition.   Mordecai Rasmussen, MD 10/18/2015, 6:45 PM

## 2015-10-18 NOTE — Progress Notes (Signed)
D: Patient is alert and oriented on the unit this shift. Patient attended  But did not participate in groups today. Patient denies suicidal ideation, homicidal ideation, auditory or visual hallucinations at the present time.  A: Scheduled medications are administered to patient as per MD orders. Emotional support and encouragement are provided. Patient is maintained on q.15 minute safety checks. Patient is informed to notify staff with questions or concerns. R: No adverse medication reactions are noted. Patient is cooperative with medication administration and treatment plan today. Patient is slightly receptive, calm and cooperative on the unit at this time. Patient does not interact  with others on the unit this shift. Patient contracts for safety at this time. Patient remains safe at this time.

## 2015-10-18 NOTE — Progress Notes (Signed)
Julie PikesSusan had a visitor on shift today, she still presents with flat and depressed affect. She was medication compliant and cooperative with treatment. No behavioral issues to report on shift at this time. She denies suicidal thoughts. She appears to be sleeping at this time.

## 2015-10-19 LAB — GLUCOSE, CAPILLARY: Glucose-Capillary: 60 mg/dL — ABNORMAL LOW (ref 65–99)

## 2015-10-19 NOTE — Progress Notes (Signed)
D: Patient appears flat and assessed.  Calm and cooperative with assessment. No acute distressed noted. Denies SI/HI/AV and contract for safety. Offered no additional question or concerns  A: safety has been maintained with Q15 minutes observation. Support and encouragement provided  Scheduled medication given.  R: Patient remains safe.  she is complaint with medication and group programming. Safety has been maintained Q15.

## 2015-10-19 NOTE — Progress Notes (Signed)
D: Patient appears flat. She rates her depression and anxiety at a 3. She denies SI/HI/AVH. Only complaints are that she is weak and has dots in her eyes. She stayed isolative to her room all evening.  A: Medication was given with education. Encouragement was provided.  R: Patient was compliant with medication. She has remained calm and cooperative. Safety maintained with 15 min checks.

## 2015-10-19 NOTE — Progress Notes (Signed)
[facility] MD Progress Note  10/19/2015 6:57 PM Julie Morrow  MRN:  [id number] Subjective:   Follow-up Saturday the fourth. Patient has no new complaints. She looks a little unsteady on her feet but she doesn't feel like it's a problem at all. She is eating well. Still looks very disheveled. Doesn't participate very much. Slow speech. Doesn't seem to be obviously psychotic. No sign of any tremor. Vital signs stable.  Patient tells me she has not been compliant with medications prior to coming in. She tells me she feels short of breath and says that she was on antibiotics prior to coming in. Per emergency room note from the physical examination did not show any signs of lung disease. There is also a chest  x-ray done that day that was within the normal limits.  Per nursing staff the patient has been somatic.  Patient tells me she does not feel well. He says the medications that she is taking brain are not helping her. She says that the medication she was on back in February did help her. I reviewed the medications and looks like she has been restarted on most of them.  Patient admited that she is somewhat stressed out because her daughter is getting married in May move further away from her.  She has not told this to anyone else yet.  This may be a source of her recent decrease in mood   Pt reports mild improvement since admission.  Mood is a little less depressed, appetite, energy and concentration are still poor.  Says she was able to sleep better last nigh with ativan. Patient denies side effects from medications. Denies any physical complaints. Denies suicidality, homicidality or auditory or visual hallucinations  During assessment she is dishevel, has a strong body odor, presents with psychomotor retardation.  Per nursing:  D: Pt denies SI/HI/AVH. Pt is pleasant and cooperative, affect is flat ad sad. Patient appears less anxious, minimal interacting with peers and staff. Patient appears  disheavled with a strong body odor, encouraged to take a bath. Patient rated depression a 6/10 will continue to monitor.  A: Pt was offered support and encouragement. Pt was given scheduled medications. Pt was encouraged to attend groups. Q 15 minute checks were done for safety.  R:Pt did not attend group. Pt is complaint with medication. Pt has no complaints.Pt receptive to treatment and safety maintained on unit.      Principal Problem: Bipolar 1 disorder, mixed, severe (HCC) Diagnosis:   Patient Active Problem List   Diagnosis Date Noted  . GERD (gastroesophageal reflux disease) [K21.9] 10/15/2015  . Bipolar 1 disorder, mixed, severe (HCC) [F31.63] 10/11/2015  . Diabetes type 2, controlled (HCC) [E11.9] 07/07/2015  . Obesity [E66.9] 07/07/2015  . Tobacco use disorder [F17.200] 07/07/2015  . Hypothyroidism [E03.9] 07/07/2015  . HLD (hyperlipidemia) [E78.5] 11/07/2014  . Psoriasis [L40.9] 11/07/2014   Total Time spent with patient: 30 minutes  Past Psychiatric History: bipolar disorder  Past Medical History:  Past Medical History  Diagnosis Date  . Hypothyroidism   . Depression   . Anxiety   . Diabetes mellitus, type II (HCC)     Patient takes Glucotrol and Januvia  . H/O diabetes mellitus 04/08/2015    borderline diabetes no medications needed.   . Bipolar 1 disorder [facility])     Past Surgical History  Procedure Laterality Date  . Abdominal hysterectomy     Family History:  Family History  Problem Relation Age of Onset  . Dementia Mother   .  Arthritis Father    Family Psychiatric  History: Patient denies Social History:  History  Alcohol Use No     History  Drug Use No    Social History   Social History  . Marital Status: Single    Spouse Name: N/A  . Number of Children: N/A  . Years of Education: N/A   Social History Main Topics  . Smoking status: Former Smoker -- 0.25 packs/day for 5 years    Types: Cigarettes    Start date: 11/06/1984  . Smokeless  tobacco: Never Used  . Alcohol Use: No  . Drug Use: No  . Sexual Activity: No   Other Topics Concern  . None   Social History Narrative    Current Medications: Current Facility-Administered Medications  Medication Dose Route Frequency Provider Last Rate Last Dose  . acetaminophen (TYLENOL) tablet 650 mg  650 mg Oral Q6H PRN Brandy HaleUzma Faheem, MD   650 mg at 10/12/15 2222  . alum & mag hydroxide-simeth (MAALOX/MYLANTA) 200-200-20 MG/5ML suspension 30 mL  30 mL Oral Q4H PRN Brandy HaleUzma Faheem, MD      . ARIPiprazole (ABILIFY) tablet 30 mg  30 mg Oral Q breakfast Jimmy FootmanAndrea Hernandez-Gonzalez, MD   30 mg at 10/19/15 1046  . aspirin chewable tablet 81 mg  81 mg Oral Q breakfast Jimmy FootmanAndrea Hernandez-Gonzalez, MD   81 mg at 10/19/15 0839  . fenofibrate tablet 160 mg  160 mg Oral Q breakfast Jimmy FootmanAndrea Hernandez-Gonzalez, MD   160 mg at 10/19/15 0839  . glipiZIDE (GLUCOTROL) tablet 10 mg  10 mg Oral BID AC Brandy HaleUzma Faheem, MD   10 mg at 10/19/15 1648  . guaiFENesin-dextromethorphan (ROBITUSSIN DM) 100-10 MG/5ML syrup 5 mL  5 mL Oral Q4H PRN Jimmy FootmanAndrea Hernandez-Gonzalez, MD      . levothyroxine (SYNTHROID, LEVOTHROID) tablet 200 mcg  200 mcg Oral QAC breakfast Jimmy FootmanAndrea Hernandez-Gonzalez, MD   200 mcg at 10/19/15 [phone]  . linagliptin (TRADJENTA) tablet 5 mg  5 mg Oral Q breakfast Jimmy FootmanAndrea Hernandez-Gonzalez, MD   5 mg at 10/19/15 0840  . lithium carbonate (ESKALITH) CR tablet 450 mg  450 mg Oral Q12H Jimmy FootmanAndrea Hernandez-Gonzalez, MD   450 mg at 10/19/15 1046  . LORazepam (ATIVAN) tablet 0.25 mg  0.25 mg Oral TID WC Jimmy FootmanAndrea Hernandez-Gonzalez, MD   0.25 mg at 10/19/15 1649  . LORazepam (ATIVAN) tablet 2 mg  2 mg Oral QHS Jimmy FootmanAndrea Hernandez-Gonzalez, MD   2 mg at 10/18/15 2144  . magnesium hydroxide (MILK OF MAGNESIA) suspension 30 mL  30 mL Oral Daily PRN Brandy HaleUzma Faheem, MD      . nicotine (NICODERM CQ - dosed in mg/24 hours) patch 14 mg  14 mg Transdermal Daily Jimmy FootmanAndrea Hernandez-Gonzalez, MD   14 mg at 10/19/15 1048  . rosuvastatin (CRESTOR)  tablet 20 mg  20 mg Oral q1800 Jimmy FootmanAndrea Hernandez-Gonzalez, MD   20 mg at 10/19/15 1731  . sodium chloride (OCEAN) 0.65 % nasal spray 1 spray  1 spray Each Nare PRN Lockie Paresiffani L Bell, MD        Lab Results:  Results for orders placed or performed during the hospital encounter of 10/11/15 (from the past 48 hour(s))  Glucose, capillary     Status: Abnormal   Collection Time: 10/18/15  6:56 AM  Result Value Ref Range   Glucose-Capillary 172 (H) 65 - 99 mg/dL  Glucose, capillary     Status: Abnormal   Collection Time: 10/18/15  4:45 PM  Result Value Ref Range   Glucose-Capillary 63 (L)  65 - 99 mg/dL   Comment 1 Notify RN   Glucose, capillary     Status: Abnormal   Collection Time: 10/19/15  4:42 PM  Result Value Ref Range   Glucose-Capillary 60 (L) 65 - 99 mg/dL    Blood Alcohol level:  Lab Results  Component Value Date   ETH <5 10/10/2015   ETH <5 06/30/2015    Physical Findings: AIMS: Facial and Oral Movements Muscles of Facial Expression: None, normal Lips and Perioral Area: None, normal Jaw: None, normal Tongue: None, normal,Extremity Movements Upper (arms, wrists, hands, fingers): None, normal Lower (legs, knees, ankles, toes): None, normal, Trunk Movements Neck, shoulders, hips: None, normal, Overall Severity Severity of abnormal movements (highest score from questions above): None, normal Incapacitation due to abnormal movements: None, normal Patient's awareness of abnormal movements (rate only patient's report): No Awareness, Dental Status Current problems with teeth and/or dentures?: No Does patient usually wear dentures?: No  CIWA:  CIWA-Ar Total: 0 COWS:  COWS Total Score: 0  Musculoskeletal: Strength & Muscle Tone: within normal limits Gait & Station: unsteady Patient leans: N/A  Psychiatric Specialty Exam: Physical Exam  Constitutional: She is oriented to person, place, and time. She appears well-developed and well-nourished.  HENT:  Head: Atraumatic.  Eyes:  EOM are normal.  Neck: Normal range of motion.  Respiratory: Effort normal.  Musculoskeletal: Normal range of motion.  Neurological: She is alert and oriented to person, place, and time.    Review of Systems  Constitutional: Negative.   HENT: Negative.   Eyes: Negative.   Respiratory: Negative.   Cardiovascular: Negative.   Gastrointestinal: Negative.   Genitourinary: Negative.   Musculoskeletal: Negative.   Skin: Negative.   Neurological: Negative.   Endo/Heme/Allergies: Negative.   Psychiatric/Behavioral: Negative.     Blood pressure 141/57, pulse 69, temperature 97.9 F (36.6 C), temperature source Oral, resp. rate 20, height 5\' 3"  (1.6 m), weight 122.018 kg (269 lb), SpO2 98 %.Body mass index is 47.66 kg/(m^2).  General Appearance: Disheveled  Eye Contact:  Fair  Speech:  Slow  Volume:  Decreased  Mood:  Depressed, Dysphoric, Hopeless, Irritable and Worthless  Affect:  Depressed and Flat  Thought Process:  Linear  Orientation:  Full (Time, Place, and Person)  Thought Content:  Rumination  Denies hallucination  Suicidal Thoughts:  No  Homicidal Thoughts:  No  Memory:  Immediate;   Poor Recent;   Poor Remote;   Fair  Judgement:  Poor  Insight:  Lacking  Psychomotor Activity:  Decreased  Concentration:  Concentration: Fair and Attention Span: Fair  Recall:  Poor  Fund of Knowledge:  Poor  Language:  Fair  Akathisia:  No  Handed:    AIMS (if indicated):     Assets:  Desire for Improvement Physical Health Social Support  ADL's:  Intact  Cognition:  Impaired,  Mild  Sleep:  Number of Hours: 8      Treatment Plan Summary: Daily contact with patient to assess and evaluate symptoms and progress in treatment and Medication management   Bipolar disorder type I: continue abilify 30 mg and lithium CR 450 mg po bid. Will need lithium level Monday am.  Improving  Insomnia: continue Ativan 2 mg by mouth daily at bedtime.  Slept 6.7 hours last night  Anxiety: continue  ativan 0.25 mg tid with meals.  Hypothyroidism: TSH highly elevated due to non compliance.  During last admission a few months ago she was seeing by endocrinology which increased synthroid to .  patient claims she was compliant with Synthroid at home  Cardiovascular health: continue aspirin 81 mg a day  Dyslipidemia the patient will be continued on Crestor 20 mg a day and 1505 at 160 mg by mouth daily  Diabetes: I will change patient's diet to low carbohydrate. She will be started on Glucotrol 10 mg by mouth twice a day and tradjenta to 5 mg a day  Possible upper respiratory tract infection: I will order symptomatic treatment.  Tobacco use disorder: continue nicotine patch 21 mg a day  Hospitalization and status continue involuntary commitment   Precautions every 15 minute checks   Estimated length of stay 7 days  Labs I will order lithium level on Monday a.m.  Lithium level tomorrow. Supportive counseling completed. Encourage continued medication management I certify that inpatient services furnished can reasonably be expected to improve the patient's condition.   Mordecai Rasmussen, MD 10/19/2015, 6:57 PM

## 2015-10-19 NOTE — Plan of Care (Signed)
Problem: Self-Concept: Goal: Level of anxiety will decrease Outcome: Completed/Met Date Met:  10/19/15 Anxiety level has gone down form 10 to 3

## 2015-10-19 NOTE — [facility] Group Notes (Signed)
[facility] LCSW Group Therapy  10/19/2015 3:15 PM  Type of Therapy:  Group Therapy  Participation Level:  Did Not Attend  Modes of Intervention:  Discussion, Education, Socialization and Support  Summary of Progress/Problems: Emotional Regulation: Patients will identify both negative and positive emotions. They will discuss emotions they have difficulty regulating and how they impact their lives. Patients will be asked to identify healthy coping skills to combat unhealthy reactions to negative emotions.     Julie Morrow L Ethan Clayburn MSW, LCSWA  10/19/2015, 3:15 PM    

## 2015-10-19 NOTE — [facility] Group Notes (Signed)
[facility] Group Notes:  (Nursing/MHT/Case Management/Adjunct)  Date:  10/19/2015  Time:  1:53 AM  Type of Therapy:  Group Therapy  Participation Level:  Active  Participation Quality:  Appropriate  Affect:  Appropriate  Cognitive:  Appropriate  Insight:  Appropriate  Engagement in Group:  Engaged  Modes of Intervention:  Discussion  Summary of Progress/Problems:  Julie Morrow 10/19/2015, 1:53 AM2 2

## 2015-10-20 LAB — LITHIUM LEVEL: Lithium Lvl: 0.84 mmol/L (ref 0.60–1.20)

## 2015-10-20 LAB — GLUCOSE, CAPILLARY
Glucose-Capillary: 84 mg/dL (ref 65–99)
Glucose-Capillary: 89 mg/dL (ref 65–99)
Glucose-Capillary: 63 mg/dL — ABNORMAL LOW (ref 65–99)

## 2015-10-20 MED ORDER — ARIPIPRAZOLE ER 400 MG IM SUSR
400.0000 mg | INTRAMUSCULAR | Status: DC
  Administered 2015-10-20: 400 mg via INTRAMUSCULAR
  Filled 2015-10-20 (×2): qty 400

## 2015-10-20 MED ORDER — CLONAZEPAM 1 MG PO TABS
2.0000 mg | ORAL_TABLET | Freq: Every day | ORAL | Status: DC
  Administered 2015-10-20: 2 mg via ORAL
  Filled 2015-10-20: qty 2

## 2015-10-20 MED ORDER — CYANOCOBALAMIN 1000 MCG/ML IJ SOLN
1000.0000 ug | Freq: Once | INTRAMUSCULAR | Status: AC
  Administered 2015-10-20: 1000 ug via INTRAMUSCULAR
  Filled 2015-10-20: qty 1

## 2015-10-20 NOTE — Evaluation (Signed)
Physical Therapy Evaluation Patient Details Name: Julie Morrow MRN: [id number] DOB: 1950/08/08 Today's Date: 10/20/2015   History of Present Illness  Patient is a 65 y/o female seen in behavioral health unit after admission requiring monitoring of medications as she has been "feeling badly" per chart.   Clinical Impression  Patient provides limited history and does not seem interested in session as she initially declines therapist. Per RN staff she shuffles her feet and has RW significantly anterior to her COM, in spite of cuing. Patient noted to continue this gait pattern in spite of therapist providing cuing in multiple forms ("keep up with me", "take bigger steps", etc.). Patient did not have any overt loss of balance with RW, which appears to be her baseline as she has been ambulating with said gait pattern for some time now per patient. As such, it does not seem reasonable to expect her to alter this gait pattern. Would recommend RW for d/c home if she does not have one at home (though unlikely given her gait pattern she would have been ambulating without one).     Follow Up Recommendations   No PT follow up indicated.     Equipment Recommendations  Rolling walker with 5" wheels    Recommendations for Other Services       Precautions / Restrictions Precautions Precautions: None Restrictions Weight Bearing Restrictions: No      Mobility  Bed Mobility Overal bed mobility: Independent             General bed mobility comments: No deficits in bed mobility.   Transfers Overall transfer level: Modified independent Equipment used: Rolling walker (2 wheeled)             General transfer comment: No deficits in sit to stand transfers.   Ambulation/Gait Ambulation/Gait assistance: Modified independent (Device/Increase time) Ambulation Distance (Feet): 175 Feet Assistive device: Rolling walker (2 wheeled) Gait Pattern/deviations: Step-through pattern;Decreased step  length - left;Decreased step length - right;Shuffle;Trunk flexed   Gait velocity interpretation: Below normal speed for age/gender General Gait Details: Patient frequently decreases step length, shuffles her feet with trunk flexed. PT provided multiple cues including verbal cues to increase stride length, maintain speed with PT, bring her COM closer to RW. She would comply initially, then revert back to her baseline. Per RN staff, they have alerted her to the same with similar results. Patient reports she has walked this way for some time.   Stairs            Wheelchair Mobility    Modified Rankin (Stroke Patients Only)       Balance Overall balance assessment: Modified Independent                                           Pertinent Vitals/Pain Pain Assessment: No/denies pain    Home Living Family/patient expects to be discharged to:: Private residence Living Arrangements: Children Available Help at Discharge: Family Type of Home: House         Home Equipment: Dan Humphreys - 2 wheels      Prior Function Level of Independence: Independent with assistive device(s)         Comments: Unclear history or reliability. She denies falls, reports she has used a RW in the past.      Hand Dominance        Extremity/Trunk Assessment   Upper Extremity  Assessment: Overall WFL for tasks assessed           Lower Extremity Assessment: Overall WFL for tasks assessed         Communication   Communication: No difficulties  Cognition Arousal/Alertness: Awake/alert Behavior During Therapy: Flat affect Overall Cognitive Status: History of cognitive impairments - at baseline (She responds appropriately to most questions, does not provide more than basic details. )                      General Comments General comments (skin integrity, edema, etc.): Disheveled appearance.     Exercises        Assessment/Plan    PT Assessment Patent does  not need any further PT services  PT Diagnosis Difficulty walking   PT Problem List    PT Treatment Interventions     PT Goals (Current goals can be found in the Care Plan section) Acute Rehab PT Goals Patient Stated Goal: To return home  PT Goal Formulation: With patient Time For Goal Achievement: 11/03/15 Potential to Achieve Goals: Good    Frequency     Barriers to discharge        Co-evaluation               End of Session Equipment Utilized During Treatment: Gait belt Activity Tolerance: Patient tolerated treatment well Patient left: in bed Nurse Communication: Mobility status         Time: 1720-1730 PT Time Calculation (min) (ACUTE ONLY): 10 min   Charges:   PT Evaluation $PT Eval Low Complexity: 1 Procedure     PT G Codes:       Kerin RansomPatrick A McNamara, PT, DPT    10/20/2015, 6:22 PM

## 2015-10-20 NOTE — Progress Notes (Signed)
Pt with shuffling gait. MD made aware. New orders received. Pt using rolling walker with ambulation. Med and group compliant. Appropriate with staff and peers. No negative behaviors noted. Will continue to assess and monitor for safety.

## 2015-10-20 NOTE — [facility] Group Notes (Addendum)
[facility] LCSW Group Therapy   10/20/2015 1pm Type of Therapy: Group Therapy   Participation Level: Active   Participation Quality: Attentive, Sharing and Supportive   Affect: Depressed and Flat   Cognitive: Alert and Oriented   Insight: Developing/Improving and Engaged   Engagement in Therapy: Developing/Improving and Engaged   Modes of Intervention: Clarification, Confrontation, Discussion, Education, Exploration,  Limit-setting, Orientation, Problem-solving, Rapport Building, Dance movement psychotherapisteality Testing, Socialization and Support   Summary of Progress/Problems: Pt identified obstacles faced currently and processed barriers involved in overcoming these obstacles. Pt identified steps necessary for overcoming these obstacles and explored motivation (internal and external) for facing these difficulties head on. Pt further identified one area of concern in their lives and chose a goal to focus on for today. Pt shared with the group the pt's desire to visit the beach.  The pt noted the other group member's suggestions as to how to achieve the pt.'s goal and reported she understood that this may not be possible.  Pt shared that the pt was was looking forward to achieving the pt.'s goal and shared perhaps the pt could continue to hope even though this might not be able.  Pt was polite and cooperative with the CSW and other group members and focused and attentive to the topics discussed and the sharing of others.     Dorothe PeaJonathan F. Aarron Wierzbicki, LCSWA, LCAS  10/20/15

## 2015-10-20 NOTE — Progress Notes (Signed)
[facility] MD Progress Note  10/20/2015 1:00 PM Julie Morrow  MRN:  [id number] Subjective:   Follow-up Saturday the fourth. Patient has no new complaints. She looks a little unsteady on her feet but she doesn't feel like it's a problem at all. She is eating well. Still looks very disheveled. Doesn't participate very much. Slow speech. Doesn't seem to be obviously psychotic. No sign of any tremor. Vital signs stable.  Patient tells me she has not been compliant with medications prior to coming in. She tells me she feels short of breath and says that she was on antibiotics prior to coming in. Per emergency room note from the physical examination did not show any signs of lung disease. There is also a chest  x-ray done that day that was within the normal limits.  Per nursing staff the patient has been somatic.  Patient tells me she does not feel well. He says the medications that she is taking brain are not helping her. She says that the medication she was on back in February did help her. I reviewed the medications and looks like she has been restarted on most of them.  Patient admited that she is somewhat stressed out because her daughter is getting married in May move further away from her.  She has not told this to anyone else yet.  This may be a source of her recent decrease in mood   Pt reports mild improvement since admission.  Mood is a little less depressed, appetite, energy and concentration are still poor.  Says she is still not sleeping despite taking 2 mg of ativan at night.  Patient denies side effects from medications. Denies any physical complaints. Denies suicidality, homicidality or auditory or visual hallucinations  During assessment she is dishevel, has a strong body odor, presents with psychomotor retardation. Not different from last week.  Gait is slow and almost parkinsonian.  PT consult requested  Per nursing: D: Patient appears flat. She rates her depression and anxiety at a 3.  She denies SI/HI/AVH. Only complaints are that she is weak and has dots in her eyes. She stayed isolative to her room all evening.  A: Medication was given with education. Encouragement was provided.  R: Patient was compliant with medication. She has remained calm and cooperative. Safety maintained with 15 min checks.   Principal Problem: Bipolar 1 disorder, mixed, severe (HCC) Diagnosis:   Patient Active Problem List   Diagnosis Date Noted  . GERD (gastroesophageal reflux disease) [K21.9] 10/15/2015  . Bipolar 1 disorder, mixed, severe (HCC) [F31.63] 10/11/2015  . Diabetes type 2, controlled (HCC) [E11.9] 07/07/2015  . Obesity [E66.9] 07/07/2015  . Tobacco use disorder [F17.200] 07/07/2015  . Hypothyroidism [E03.9] 07/07/2015  . HLD (hyperlipidemia) [E78.5] 11/07/2014  . Psoriasis [L40.9] 11/07/2014   Total Time spent with patient: 30 minutes  Past Psychiatric History: bipolar disorder  Past Medical History:  Past Medical History  Diagnosis Date  . Hypothyroidism   . Depression   . Anxiety   . Diabetes mellitus, type II (HCC)     Patient takes Glucotrol and Januvia  . H/O diabetes mellitus 04/08/2015    borderline diabetes no medications needed.   . Bipolar 1 disorder [facility])     Past Surgical History  Procedure Laterality Date  . Abdominal hysterectomy     Family History:  Family History  Problem Relation Age of Onset  . Dementia Mother   . Arthritis Father    Family Psychiatric  History: Patient denies  Social History:  History  Alcohol Use No     History  Drug Use No    Social History   Social History  . Marital Status: Single    Spouse Name: N/A  . Number of Children: N/A  . Years of Education: N/A   Social History Main Topics  . Smoking status: Former Smoker -- 0.25 packs/day for 5 years    Types: Cigarettes    Start date: 11/06/1984  . Smokeless tobacco: Never Used  . Alcohol Use: No  . Drug Use: No  . Sexual Activity: No   Other Topics  Concern  . None   Social History Narrative    Current Medications: Current Facility-Administered Medications  Medication Dose Route Frequency Provider Last Rate Last Dose  . acetaminophen (TYLENOL) tablet 650 mg  650 mg Oral Q6H PRN Brandy HaleUzma Faheem, MD   650 mg at 10/12/15 2222  . alum & mag hydroxide-simeth (MAALOX/MYLANTA) 200-200-20 MG/5ML suspension 30 mL  30 mL Oral Q4H PRN Brandy HaleUzma Faheem, MD      . ARIPiprazole (ABILIFY) tablet 30 mg  30 mg Oral Q breakfast Jimmy FootmanAndrea Hernandez-Gonzalez, MD   30 mg at 10/20/15 0805  . aspirin chewable tablet 81 mg  81 mg Oral Q breakfast Jimmy FootmanAndrea Hernandez-Gonzalez, MD   81 mg at 10/20/15 [id number]  . fenofibrate tablet 160 mg  160 mg Oral Q breakfast Jimmy FootmanAndrea Hernandez-Gonzalez, MD   160 mg at 10/20/15 [id number]  . glipiZIDE (GLUCOTROL) tablet 10 mg  10 mg Oral BID AC Brandy HaleUzma Faheem, MD   10 mg at 10/20/15 0805  . guaiFENesin-dextromethorphan (ROBITUSSIN DM) 100-10 MG/5ML syrup 5 mL  5 mL Oral Q4H PRN Jimmy FootmanAndrea Hernandez-Gonzalez, MD      . levothyroxine (SYNTHROID, LEVOTHROID) tablet 200 mcg  200 mcg Oral QAC breakfast Jimmy FootmanAndrea Hernandez-Gonzalez, MD   200 mcg at 10/20/15 0636  . linagliptin (TRADJENTA) tablet 5 mg  5 mg Oral Q breakfast Jimmy FootmanAndrea Hernandez-Gonzalez, MD   5 mg at 10/20/15 [id number]  . lithium carbonate (ESKALITH) CR tablet 450 mg  450 mg Oral Q12H Jimmy FootmanAndrea Hernandez-Gonzalez, MD   450 mg at 10/20/15 0808  . LORazepam (ATIVAN) tablet 2 mg  2 mg Oral QHS Jimmy FootmanAndrea Hernandez-Gonzalez, MD   2 mg at 10/19/15 2220  . magnesium hydroxide (MILK OF MAGNESIA) suspension 30 mL  30 mL Oral Daily PRN Brandy HaleUzma Faheem, MD      . nicotine (NICODERM CQ - dosed in mg/24 hours) patch 14 mg  14 mg Transdermal Daily Jimmy FootmanAndrea Hernandez-Gonzalez, MD   14 mg at 10/20/15 0808  . rosuvastatin (CRESTOR) tablet 20 mg  20 mg Oral q1800 Jimmy FootmanAndrea Hernandez-Gonzalez, MD   20 mg at 10/19/15 1731  . sodium chloride (OCEAN) 0.65 % nasal spray 1 spray  1 spray Each Nare PRN Lockie Paresiffani L Bell, MD        Lab Results:   Results for orders placed or performed during the hospital encounter of 10/11/15 (from the past 48 hour(s))  Glucose, capillary     Status: Abnormal   Collection Time: 10/18/15  4:45 PM  Result Value Ref Range   Glucose-Capillary 63 (L) 65 - 99 mg/dL   Comment 1 Notify RN   Glucose, capillary     Status: Abnormal   Collection Time: 10/19/15  4:42 PM  Result Value Ref Range   Glucose-Capillary 60 (L) 65 - 99 mg/dL  Glucose, capillary     Status: None   Collection Time: 10/20/15  6:43 AM  Result Value Ref Range  Glucose-Capillary 84 65 - 99 mg/dL  Lithium level     Status: None   Collection Time: 10/20/15  7:38 AM  Result Value Ref Range   Lithium Lvl 0.84 0.60 - 1.20 mmol/L  Glucose, capillary     Status: None   Collection Time: 10/20/15 11:49 AM  Result Value Ref Range   Glucose-Capillary 89 65 - 99 mg/dL   Comment 1 Notify RN     Blood Alcohol level:  Lab Results  Component Value Date   ETH <5 10/10/2015   ETH <5 06/30/2015    Physical Findings: AIMS: Facial and Oral Movements Muscles of Facial Expression: None, normal Lips and Perioral Area: None, normal Jaw: None, normal Tongue: None, normal,Extremity Movements Upper (arms, wrists, hands, fingers): None, normal Lower (legs, knees, ankles, toes): None, normal, Trunk Movements Neck, shoulders, hips: None, normal, Overall Severity Severity of abnormal movements (highest score from questions above): None, normal Incapacitation due to abnormal movements: None, normal Patient's awareness of abnormal movements (rate only patient's report): No Awareness, Dental Status Current problems with teeth and/or dentures?: No Does patient usually wear dentures?: No  CIWA:  CIWA-Ar Total: 0 COWS:  COWS Total Score: 0  Musculoskeletal: Strength & Muscle Tone: within normal limits Gait & Station: unsteady Patient leans: N/A  Psychiatric Specialty Exam: Physical Exam  Constitutional: She is oriented to person, place, and time.  She appears well-developed and well-nourished.  HENT:  Head: Atraumatic.  Eyes: EOM are normal.  Neck: Normal range of motion.  Respiratory: Effort normal.  Musculoskeletal: Normal range of motion.  Neurological: She is alert and oriented to person, place, and time.    Review of Systems  Constitutional: Negative.   HENT: Negative.   Eyes: Negative.   Respiratory: Negative.   Cardiovascular: Negative.   Gastrointestinal: Negative.   Genitourinary: Negative.   Musculoskeletal: Negative.   Skin: Negative.   Neurological: Negative.   Endo/Heme/Allergies: Negative.   Psychiatric/Behavioral: Negative.     Blood pressure 124/91, pulse 65, temperature 98.6 F (37 C), temperature source Oral, resp. rate 20, height  (1.6 m), weight 122.018 kg (269 lb), SpO2 98 %.Body mass index is 47.66 kg/(m^2).  General Appearance: Disheveled  Eye Contact:  Fair  Speech:  Slow  Volume:  Decreased  Mood:  Depressed, Dysphoric, Hopeless, Irritable and Worthless  Affect:  Depressed and Flat  Thought Process:  Linear  Orientation:  Full (Time, Place, and Person)  Thought Content:  Rumination  Denies hallucination  Suicidal Thoughts:  No  Homicidal Thoughts:  No  Memory:  Immediate;   Poor Recent;   Poor Remote;   Fair  Judgement:  Poor  Insight:  Lacking  Psychomotor Activity:  Decreased  Concentration:  Concentration: Fair and Attention Span: Fair  Recall:  Poor  Fund of Knowledge:  Poor  Language:  Fair  Akathisia:  No  Handed:    AIMS (if indicated):     Assets:  Desire for Improvement Physical Health Social Support  ADL's:  Intact  Cognition:  Impaired,  Mild  Sleep:  Number of Hours: 8.75      Treatment Plan Summary: Daily contact with patient to assess and evaluate symptoms and progress in treatment and Medication management   Bipolar disorder type I: continue abilify 30 mg and lithium CR 450 mg po bid. Lithium level today was 0.84. Pt to receive abilify IM to day 400 mg  /day  Insomnia: pt says she is not sleeping at night.  Says she slept better with  klonopin than ativan.  Will d/c ativan and instead use klonopin 2 mg qhs  Hypothyroidism: TSH highly elevated due to non compliance.  During last admission a few months ago she was seeing by endocrinology which increased synthroid to .  Patient claims she was compliant with Synthroid at home  Cardiovascular health: continue aspirin 81 mg a day  Dyslipidemia the patient will be continued on Crestor 20 mg a day and 1505 at 160 mg by mouth daily  Diabetes: I will change patient's diet to low carbohydrate. She will be started on Glucotrol 10 mg by mouth twice a day and tradjenta to 5 mg a day  Possible upper respiratory tract infection: resolved  Tobacco use disorder: continue nicotine patch 21 mg a day  Gait issues: like shuffling gait.  Will order a walker and order a PT consult.  No medications causing gait issues.  Abilify unlikely to cause parkinsonism    B12 low levels: on vit inj daily  Hospitalization and status continue involuntary commitment   Precautions every 15 minute checks   Labs: lithium level on 6/5 was 0.84   I certify that inpatient services furnished can reasonably be expected to improve the patient's condition.   Jimmy Footman, MD 10/20/2015, 1:00 PM

## 2015-10-20 NOTE — Progress Notes (Signed)
D: Patient is alert and oriented on the unit this shift. Patient attended groups today. Patient denies suicidal ideation, homicidal ideation, auditory or visual hallucinations at the present time.  A: Scheduled medications are administered to patient as per MD orders. Emotional support and encouragement are provided. Patient is maintained on q.15 minute safety checks. Patient is informed to notify staff with questions or concerns. R: No adverse medication reactions are noted. Patient is cooperative with medication administration and treatment plan today. Patient is  calm and cooperative on the unit at this time. Patient  Does not interact  with others on the unit this shift. Patient contracts for safety at this time. Patient remains safe at this time.

## 2015-10-20 NOTE — Progress Notes (Signed)
Recreation Therapy Notes  Date: 06.05.17 Time: 9:30 am Location: Craft Room  Group Topic: Self-expression  Goal Area(s) Addresses:  Patient will identify one color per emotion listed on wheel. Patient will verbalize one emotion experienced during session. Patient will be educated on other forms of self-expression.  Behavioral Response: Did not attend  Intervention: Emotion Wheel  Activity: Patients were given an Emotion Wheel worksheet and instructed to pick a color for each emotion listed on the wheel.  Education: LRT educated patients on other forms of self-expression.  Education Outcome: Patient did not attend group.  Clinical Observations/Feedback: Patient did not attend group.  Warren Kugelman M, LRT/CTRS 10/20/2015 10:14 AM 

## 2015-10-20 NOTE — Plan of Care (Signed)
Problem: Activity: Goal: Interest or engagement in leisure activities will improve Outcome: Not Progressing Patient continues to be isolative but calm [place] RN

## 2015-10-20 NOTE — Plan of Care (Signed)
Problem: Health Behavior/Discharge Planning: Goal: Compliance with therapeutic regimen will improve Outcome: Progressing Pt is med compliant

## 2015-10-21 LAB — GLUCOSE, CAPILLARY
Glucose-Capillary: 85 mg/dL (ref 65–99)
Glucose-Capillary: 66 mg/dL (ref 65–99)
Glucose-Capillary: 72 mg/dL (ref 65–99)

## 2015-10-21 MED ORDER — ROSUVASTATIN CALCIUM 20 MG PO TABS: 20.0000 mg | ORAL_TABLET | Freq: Every day | ORAL | Status: DC

## 2015-10-21 MED ORDER — LEVOTHYROXINE SODIUM 200 MCG PO TABS: 200.0000 ug | ORAL_TABLET | Freq: Every day | ORAL | Status: DC

## 2015-10-21 MED ORDER — GLIPIZIDE 10 MG PO TABS: 10.0000 mg | ORAL_TABLET | Freq: Two times a day (BID) | ORAL | Status: DC

## 2015-10-21 MED ORDER — ARIPIPRAZOLE ER 400 MG IM SUSR: 400.0000 mg | INTRAMUSCULAR | Status: DC

## 2015-10-21 MED ORDER — ARIPIPRAZOLE 30 MG PO TABS: 30.0000 mg | ORAL_TABLET | Freq: Every day | ORAL | Status: DC

## 2015-10-21 MED ORDER — FENOFIBRATE 160 MG PO TABS: 160.0000 mg | ORAL_TABLET | Freq: Every day | ORAL | Status: DC

## 2015-10-21 MED ORDER — TRAZODONE HCL 50 MG PO TABS
150.0000 mg | ORAL_TABLET | Freq: Every day | ORAL | Status: DC
  Administered 2015-10-21: 150 mg via ORAL
  Filled 2015-10-21: qty 1

## 2015-10-21 MED ORDER — SITAGLIPTIN PHOSPHATE 100 MG PO TABS: 100.0000 mg | ORAL_TABLET | Freq: Every day | ORAL | Status: DC

## 2015-10-21 MED ORDER — CYANOCOBALAMIN 1000 MCG/ML IJ SOLN
1000.0000 ug | Freq: Every day | INTRAMUSCULAR | Status: DC
  Administered 2015-10-21 – 2015-10-22 (×2): 1000 ug via SUBCUTANEOUS
  Filled 2015-10-21 (×2): qty 1

## 2015-10-21 MED ORDER — LITHIUM CARBONATE ER 300 MG PO TBCR: 600.0000 mg | EXTENDED_RELEASE_TABLET | Freq: Every day | ORAL | Status: DC

## 2015-10-21 NOTE — Discharge Summary (Addendum)
Physician Discharge Summary Note  Patient:  Julie Morrow is an 65 y.o., female MRN:  [id number] DOB:  25-May-1950 Patient phone:  [phone] (home)  Patient address:   [address] [place] [address],  Total Time spent with patient: 30 minutes  Date of Admission:  10/11/2015 Date of Discharge: 10/22/15  Reason for Admission:  Depression and SI  Principal Problem: Bipolar 1 disorder, mixed, severe ([place]) Discharge Diagnoses: Patient Active Problem List   Diagnosis Date Noted  . GERD (gastroesophageal reflux disease) [K21.9] 10/15/2015  . Bipolar 1 disorder, mixed, severe ([place]) [F31.63] 10/11/2015  . Diabetes type 2, controlled ([place]) [E11.9] 07/07/2015  . Obesity [E66.9] 07/07/2015  . Tobacco use disorder [F17.200] 07/07/2015  . Hypothyroidism [E03.9] 07/07/2015  . HLD (hyperlipidemia) [E78.5] 11/07/2014  . Psoriasis [L40.9] 11/07/2014    History of Present Illness: 65 year old Morbidly obese woman admitted for worsening depression, poor sleep, and decreased appetite.   Patient states that she has been feeling poorly for a few weeks now. She feels that her medications were working ok but she left two medications (she can not remember which ones) on the table when she went to her daughters house and didn't take those pills.   She feels "bad bad bad." She seem to have difficulty explaining her situation in detail and states her life is generally "great" though she is very depressed.  She has been having racing thoughts , decreased appetite and insomnia. Her mind races and can't fall asleep or stay asleep. Gets 2-3 hours of sleep a night but used to get 6 hours.   Mood is "worried, sad, distraught wondering if I will ever get over this. Last time I was here the medications were fine." She feels her life is "perfectly fine" and she has good people in her life. She used to spend her days cleaning and cooking but now just lays in bed.   She lives with her father, is on  disability for bipolar. She is alert and oriented but when asked her age she initially said "I don't know... Let's see, I was born in [birth year]..." She was able to get her age correct when asked again. Patient appears depressed, has mostly vague responses and poor insight.   Per nursing: D: Pt denies SI/HI/AVH. Pt is pleasant and cooperative, affect is flat and sad but brightens upon approach. Pt appears anxious and she is not interacting with peers and staff appropriately.  A: Pt was offered support and encouragement. Pt was given scheduled medications. Pt was encouraged to attend groups. Q 15 minute checks were done for safety.  R:Pt did not attend group. Pt is taking medication. Pt has no complaints.Pt receptive to treatment and safety maintained on unit.  Per consult note: Patient is a 65 year old morbidly obese female who was brought into the emergency room by her family members. She reported that she has recently been discharged from the inpatient behavioral health unit couple of months ago and she did well for a few weeks after her discharge. As she was compliant of her medications. She reported that she was following with Delia Chimes and was getting her medications. She reported that now she has been feeling depressed, she cannot eat and sleep. She has racing thoughts most of the time. She reported that she is unable to do anything. She is laying in bed most of the time. She appeared disheveled and depressed during the interview. She reported that she occasionally has auditory hallucinations she is unable  to contract for safety at this time. She reported that she still lives with her dad and he is doing terrible. Patient reported that she is taking the Abilify oral medications. Patient has worsening of her depression and reported that she has been experiencing issues to deal with her life situations.  Associated Signs/Symptoms: Depression Symptoms: depressed mood, anhedonia, insomnia, psychomotor  retardation, fatigue, feelings of worthlessness/guilt, difficulty concentrating, impaired memory, decreased appetite, (Hypo) Manic Symptoms: Distractibility, Anxiety Symptoms: denies Psychotic Symptoms: denies PTSD Symptoms: Negative Total Time spent with patient: 1 hour  Past Psychiatric History: She reports 3 prior hospitalizations for mania and depression.her last hospitalization was in our unit back in February of this year. At that time the patient had a manic episode and was restarted on Abilify oral and injectable. Patient reports she was a patient in our outpatient clinic and used to see Dr. Annitta Jersey. After Dr. Annitta Jersey left she was referred to another psychiatrist but she did not like the psychiatrist and decided not to return anymore. Patient denies any history of suicidal attempt  Past Medical History: She denies any history of seizures or head trauma Past Medical History  Diagnosis Date  . Hypothyroidism   . Depression   . Anxiety   . Diabetes mellitus, type II ([place])     Patient takes Glucotrol and Januvia  . H/O diabetes mellitus 04/08/2015    borderline diabetes no medications needed.   . Bipolar 1 disorder [facility])     Past Surgical History  Procedure Laterality Date  . Abdominal hysterectomy     Family History:  Family History  Problem Relation Age of Onset  . Dementia Mother   . Arthritis Father    Family Psychiatric  History: Patient stated her mother and brother were "mental".. Patient reports her mother passed away from dementia.   Social History: Patient is currently divorced. She has 2 adult children from her marriage. Patient was staying with her father in [place] . Denies any legal history. History  Alcohol Use No     History  Drug Use No    Social History   Social History  . Marital Status: Single    Spouse Name: N/A  . Number of Children: N/A  . Years of Education: N/A   Social History Main Topics  . Smoking status: Former Smoker --  0.25 packs/day for 5 years    Types: Cigarettes    Start date: 11/06/1984  . Smokeless tobacco: Never Used  . Alcohol Use: No  . Drug Use: No  . Sexual Activity: No   Other Topics Concern  . None   Social History Narrative    Hospital Course:     Bipolar disorder type I: continue abilify 30 mg and lithium CR 450 mg po bid. Lithium level on 6/5 was 0.84. Pt to receive abilify IM 400 mg on 6/5.   Patient reports feeling better and wanting to be discharged because she has to pay bills. However she does not look better at examination. Her mood appears dysphoric and her affect is blunted. Psychomotor activity is retarded, hygiene and grooming are poor, as a strong body all are in the room. Patient is not coming out of her room much. Participation in group is poor. Says she is not sleeping at night despite receiving high doses of benzodiazepines.  Insomnia: pt says she is not sleeping at night. Has tried ativan 2 mg and klonopin 2 mg w/o benefit. Will order trazodone 150 mg qhs  Hypothyroidism: TSH highly  elevated due to non compliance. During last admission a few months ago she was seeing by endocrinology which increased synthroid to 270mg. Patient claims she was compliant with Synthroid at home  Cardiovascular health: continue aspirin 81 mg a day  Dyslipidemia the patient will be continued on Crestor 20 mg a day and Fenofibrate at 160 mg by mouth daily  Diabetes: continue Glucotrol 10 mg by mouth twice a day and Januvia  Possible upper respiratory tract infection: resolved  Tobacco use disorder: pt received nicotine patch 21 mg a day  Gait issues: like shuffling gait. Will order a walker and order a PT consult. No medications causing gait issues. Abilify unlikely to cause parkinsonism----Per PT pt needs rolling walker at home  B12 low levels: patient received 3 injection of B12 of 1000 mg.  Level will need to be f/u outpatient.  Recommend to continue with injections weekly  for at leats 4 weeks  Labs: lithium level on 6/5 was 0.84  On discharge the patient reports improvement in mood. She denies feeling hopeless or helpless, denies suicidality, homicidality or having auditory or visual hallucinations. She denies side effects from medications. She denies having any physical complaints.    During her stay her particular see patient in group was fair to poor. She isolated in the room. She had poor grooming and hygiene. The patient was showering but was refusing to change her clothes.  She did not display any disruptive or unsafe behaviors. She did not require seclusion, restraints or forced medications.  As patient has failed to follow up outpatient with psychiatry and we made a referral for [facility] services as patient only has Medicare insurance which doesn't cover for  those services. We were unable to find [facility] to cover ACT.  [facility] [place] Physical Findings: AIMS: Facial and Oral Movements Muscles of Facial Expression: None, normal Lips and Perioral Area: None, normal Jaw: None, normal Tongue: None, normal,Extremity Movements Upper (arms, wrists, hands, fingers): None, normal Lower (legs, knees, ankles, toes): None, normal, Trunk Movements Neck, shoulders, hips: None, normal, Overall Severity Severity of abnormal movements (highest score from questions above): None, normal Incapacitation due to abnormal movements: None, normal Patient's awareness of abnormal movements (rate only patient's report): No Awareness, Dental Status Current problems with teeth and/or dentures?: No Does patient usually wear dentures?: No  CIWA:  CIWA-Ar Total: 0 COWS:  COWS Total Score: 0  Musculoskeletal: Strength & Muscle Tone: within normal limits Gait & Station: normal Patient leans: N/A  Psychiatric Specialty Exam: Physical Exam  Constitutional: She is oriented to person, place, and time. She appears well-developed and  well-nourished.  HENT:  Head: Normocephalic and atraumatic.  Eyes: EOM are normal.  Neck: Normal range of motion.  Respiratory: Effort normal.  Musculoskeletal: Normal range of motion.  Shuffling gait  Neurological: She is alert and oriented to person, place, and time.  Skin: Skin is dry.    Review of Systems  HENT: Negative.   Eyes: Negative.   Respiratory: Negative.   Cardiovascular: Negative.   Gastrointestinal: Negative.   Genitourinary: Negative.   Musculoskeletal: Negative.   Skin: Negative.   Neurological: Positive for weakness.  Endo/Heme/Allergies: Negative.   Psychiatric/Behavioral: Positive for depression. Negative for suicidal ideas, hallucinations and substance abuse. The patient has insomnia. The patient is not nervous/anxious.     Blood pressure 134/51, pulse 60, temperature 98.3 F (36.8 C), temperature source Oral, resp. rate 20, height '5\' 3"'$  (1.6 m), weight 122.018  kg (269 lb), SpO2 98 %.Body mass index is 47.66 kg/(m^2).  General Appearance: Disheveled  Eye Contact:  Good  Speech:  Slow  Volume:  Decreased  Mood:  Dysphoric  Affect:  Constricted  Thought Process:  Linear and Descriptions of Associations: Intact  Orientation:  Full (Time, Place, and Person)  Thought Content:  Hallucinations: None  Suicidal Thoughts:  No  Homicidal Thoughts:  No  Memory:  Immediate;   Fair Recent;   Fair Remote;   Fair  Judgement:  Fair  Insight:  Fair  Psychomotor Activity:  Decreased  Concentration:  Concentration: Fair and Attention Span: Fair  Recall:  [employer] of Knowledge:  Fair  Language:  Good  Akathisia:  No  Handed:    AIMS (if indicated):     Assets:  Agricultural consultant Housing  ADL's:  Intact  Cognition:  WNL  Sleep:  Number of Hours: 6     Have you used any form of tobacco in the last 30 days? (Cigarettes, Smokeless Tobacco, Cigars, and/or Pipes): No  Has this patient used any form of tobacco in the last 30  days? (Cigarettes, Smokeless Tobacco, Cigars, and/or Pipes) Yes, Yes, A prescription for an FDA-approved tobacco cessation medication was offered at discharge and the patient refused  Blood Alcohol level:  Lab Results  Component Value Date   [facility] <5 10/10/2015   ETH <5 76/28/3151    Metabolic Disorder Labs:  Lab Results  Component Value Date   HGBA1C 5.8 07/03/2015   Lab Results  Component Value Date   PROLACTIN 15.1 07/03/2015   Lab Results  Component Value Date   CHOL 175 07/03/2015   TRIG 298* 07/03/2015   HDL 22* 07/03/2015   CHOLHDL 8.0 07/03/2015   VLDL 60* 07/03/2015   LDLCALC 93 07/03/2015   [place] SEE COMMENT 06/17/2011   Results for TYRESE, FICEK (MRN [id number]) as of 10/21/2015 14:54  Ref. Range 10/10/2015 18:05 10/16/2015 16:32 10/20/2015 07:38  Sodium Latest Ref Range: 135-145 mmol/L 138    Potassium Latest Ref Range: 3.5-5.1 mmol/L 3.6    Chloride Latest Ref Range: 101-111 mmol/L 102    CO2 Latest Ref Range: 22-32 mmol/L 27    BUN Latest Ref Range: 6-20 mg/dL 9    Creatinine Latest Ref Range: 0.44-1.00 mg/dL 0.90    Calcium Latest Ref Range: 8.9-10.3 mg/dL 9.3    EGFR (Non-African Amer.) Latest Ref Range: >60 mL/min >60    EGFR (African American) Latest Ref Range: >60 mL/min >60    Glucose Latest Ref Range: 65-99 mg/dL 112 (H)    Anion gap Latest Ref Range: 5-15  9    Alkaline Phosphatase Latest Ref Range: 38-126 U/L 131 (H)    Albumin Latest Ref Range: 3.5-5.0 g/dL 4.5    AST Latest Ref Range: 15-41 U/L 36    ALT Latest Ref Range: 14-54 U/L 28    Total Protein Latest Ref Range: 6.5-8.1 g/dL 7.7    Total Bilirubin Latest Ref Range: 0.3-1.2 mg/dL 0.5    Troponin I Latest Ref Range: <0.031 ng/mL <0.03    Vitamin B12 Latest Ref Range: 180-914 pg/mL  255   WBC Latest Ref Range: 3.6-11.0 K/uL 11.1 (H)    RBC Latest Ref Range: 3.80-5.20 MIL/uL 4.92    Hemoglobin Latest Ref Range: 12.0-16.0 g/dL 14.7    HCT Latest Ref Range: 35.0-47.0 % 45.3    MCV Latest Ref  Range: 80.0-100.0 fL 92.1    MCH Latest Ref Range: 26.0-34.0 pg  29.9    MCHC Latest Ref Range: 32.0-36.0 g/dL 32.5    RDW Latest Ref Range: 11.5-14.5 % 16.9 (H)    Platelets Latest Ref Range: 150-440 K/uL 207    Acetaminophen (Tylenol), S Latest Ref Range: 10-30 ug/mL <10 (L)    Lithium Latest Ref Range: 0.60-1.20 mmol/L   4.33  Salicylate Lvl Latest Ref Range: 2.8-30.0 mg/dL <4.0    TSH Latest Ref Range: 0.350-4.500 uIU/mL >90.000 (H)     See Psychiatric Specialty Exam and Suicide Risk Assessment completed by Attending Physician prior to discharge.  Discharge destination:  Home  Is patient on multiple antipsychotic therapies at discharge:  No   Has Patient had three or more failed trials of antipsychotic monotherapy by history:  No  Recommended Plan for Multiple Antipsychotic Therapies: NA      Discharge Instructions    Walker rolling    Complete by:  As directed             Medication List    STOP taking these medications        buPROPion 150 MG 12 hr tablet  Commonly known as:  WELLBUTRIN SR     furosemide 20 MG tablet  Commonly known as:  LASIX      TAKE these medications      Indication   ARIPiprazole 30 MG tablet  Commonly known as:  ABILIFY  Take 1 tablet (30 mg total) by mouth at bedtime.   Indication:  bipolar     ARIPiprazole 400 MG Susr  Inject 400 mg into the muscle every 30 (thirty) days.  Start taking on:  11/18/2015   Indication:  bipolar     aspirin 81 MG chewable tablet  Chew 81 mg by mouth daily.  Notes to Patient:  Cardiovascular health      clonazePAM 1 MG tablet  Commonly known as:  KLONOPIN  Take 1 tablet (1 mg total) by mouth at bedtime as needed for anxiety.  Notes to Patient:  insomnia      fenofibrate 160 MG tablet  Take 1 tablet (160 mg total) by mouth daily with breakfast.  Notes to Patient:  cholesterol      glipiZIDE 10 MG tablet  Commonly known as:  GLUCOTROL  Take 1 tablet (10 mg total) by mouth 2 (two) times daily  before a meal.  Notes to Patient:  diabetes   Indication:  Type 2 Diabetes     levothyroxine 200 MCG tablet  Commonly known as:  SYNTHROID, LEVOTHROID  Take 1 tablet (200 mcg total) by mouth daily before breakfast.   Indication:  Underactive Thyroid     lithium carbonate 300 MG CR tablet  Commonly known as:  LITHOBID  Take 2 tablets (600 mg total) by mouth at bedtime.   Indication:  Manic-Depression     rosuvastatin 20 MG tablet  Commonly known as:  CRESTOR  Take 1 tablet (20 mg total) by mouth daily at 6 PM.   Indication:  High Amount of Fats in the Blood     sitaGLIPtin 100 MG tablet  Commonly known as:  JANUVIA  Take 1 tablet (100 mg total) by mouth daily.   Indication:  Type 2 Diabetes       Follow-up Information    Call [facility].   Why:  Please call the office upon discharge to settle your outstanding balance which will then enable you to schedule your hospital follow-up with your primary care physician Kendal Ghazarian   Contact information:  [place] [address] Ph: [phone] Fax: [phone]       Go to [facility].   Why:  Please arrive on June 21st at 11:45am for therapy with Rosary Lively and on June 23rd at 2pm for medication management with Comer Locket, P.A.  [facility] will call to confirm these dates and times with you.  Please call to reschedule if necessary.   Contact information:   [address], [place], [place]  [address]  Phone [phone] Fax [phone]           Signed: Hildred Priest, MD 10/22/2015, 6:17 PM

## 2015-10-21 NOTE — Progress Notes (Signed)
Patient appears disheveled and unsteady on her feet but using a walker. Noted with body odor. Patient was encouraged to take a shower(given supplies). She did take a shower but refused to do laundry stated that she was going to wash them at home when she leaves tomorrow. Remains medication compliant. Isolative to her room, comes out for meals only. Denies SI/HI/AV/VH at this time and contracts for safety.

## 2015-10-21 NOTE — Plan of Care (Signed)
Problem: Medication: Goal: Compliance with prescribed medication regimen will improve Outcome: Progressing Patient medication compliant   

## 2015-10-21 NOTE — Progress Notes (Signed)
Recreation Therapy Notes  Date: 06.06.17 Time: 9:30 am Location: Craft Room  Group Topic: Goal Setting  Goal Area(s) Addresses:  Patient will identify at least one goal. Patient will identify at least one obstacle.  Behavioral Response: Did not attend  Intervention: Recovery Goal Chart  Activity: Patients were instructed to make a Recovery Goal Chart including goals, obstacles, the date they started working on their goals, and the date they achieved their goals.  Education: LRT educated patients on ways they can celebrate in a healthy way.  Education Outcome: Patient did not attend group.   Clinical Observations/Feedback: Patient did not attend group.  Mallie Giambra M, LRT/CTRS 10/21/2015 10:17 AM 

## 2015-10-21 NOTE — Progress Notes (Signed)
[facility]  10/21/2015 10:26 AM Julie DamesSusan B Morrow  MRN:  [id number] Subjective:   Follow-up Saturday the fourth. Patient has no new complaints. She looks a little unsteady on her feet but she doesn't feel like it's a problem at all. She is eating well. Still looks very disheveled. Doesn't participate very much. Slow speech. Doesn't seem to be obviously psychotic. No sign of any tremor. Vital signs stable.  Patient tells me she has not been compliant with medications prior to coming in. She tells me she feels short of breath and says that she was on antibiotics prior to coming in. Per emergency room note from the physical examination did not show any signs of lung disease. There is also a chest  x-ray done that day that was within the normal limits.  Per nursing staff the patient has been somatic.  Patient tells me she does not feel well. He says the medications that she is taking brain are not helping her. She says that the medication she was on back in February did help her. I reviewed the medications and looks like she has been restarted on most of them.  Patient admited that she is somewhat stressed out because her daughter is getting married in May move further away from her.  She has not told this to anyone else yet.  This may be a source of her recent decrease in mood  Pt reports some improvement since admission.  Mood is a little less depressed, appetite, energy and concentration are still poor.  Says she is still not sleeping despite taking 2 mg of ativan in trying Klonopin 2 mg yesterday.  Patient denies side effects from medications. Denies any physical complaints. Denies suicidality, homicidality or auditory or visual hallucinations  During assessment she is dishevel, has a strong body odor, presents with psychomotor retardation. Not different from last week.  Gait is slow and almost parkinsonian.    Parents-not attending groups. She is isolating in her room  Per nursing: D:  Patient is alert and oriented on the unit this shift. Patient attended groups today. Patient denies suicidal ideation, homicidal ideation, auditory or visual hallucinations at the present time.  A: Scheduled medications are administered to patient as per MD orders. Emotional support and encouragement are provided. Patient is maintained on q.15 minute safety checks. Patient is informed to notify staff with questions or concerns. R: No adverse medication reactions are noted. Patient is cooperative with medication administration and treatment plan today. Patient is calm and cooperative on the unit at this time. Patient Does not interact with others on the unit this shift. Patient contracts for safety at this time. Patient remains safe at this time.  Principal Problem: Bipolar 1 disorder, mixed, severe (HCC) Diagnosis:   Patient Active Problem List   Diagnosis Date Noted  . GERD (gastroesophageal reflux disease) [K21.9] 10/15/2015  . Bipolar 1 disorder, mixed, severe (HCC) [F31.63] 10/11/2015  . Diabetes type 2, controlled (HCC) [E11.9] 07/07/2015  . Obesity [E66.9] 07/07/2015  . Tobacco use disorder [F17.200] 07/07/2015  . Hypothyroidism [E03.9] 07/07/2015  . HLD (hyperlipidemia) [E78.5] 11/07/2014  . Psoriasis [L40.9] 11/07/2014   Total Time spent with patient: 30 minutes  Past Psychiatric History: bipolar disorder  Past Medical History:  Past Medical History  Diagnosis Date  . Hypothyroidism   . Depression   . Anxiety   . Diabetes mellitus, type II (HCC)     Patient takes Glucotrol and Januvia  . H/O diabetes mellitus 04/08/2015  borderline diabetes no medications needed.   . Bipolar 1 disorder [facility])     Past Surgical History  Procedure Laterality Date  . Abdominal hysterectomy     Family History:  Family History  Problem Relation Age of Onset  . Dementia Mother   . Arthritis Father    Family Psychiatric  History: Patient denies Social History:  History  Alcohol  Use No     History  Drug Use No    Social History   Social History  . Marital Status: Single    Spouse Name: N/A  . Number of Children: N/A  . Years of Education: N/A   Social History Main Topics  . Smoking status: Former Smoker -- 0.25 packs/day for 5 years    Types: Cigarettes    Start date: 11/06/1984  . Smokeless tobacco: Never Used  . Alcohol Use: No  . Drug Use: No  . Sexual Activity: No   Other Topics Concern  . None   Social History Narrative    Current Medications: Current Facility-Administered Medications  Medication Dose Route Frequency Provider Last Rate Last Dose  . acetaminophen (TYLENOL) tablet 650 mg  650 mg Oral Q6H PRN Brandy Hale, MD   650 mg at 10/12/15 2222  . alum & mag hydroxide-simeth (MAALOX/MYLANTA) 200-200-20 MG/5ML suspension 30 mL  30 mL Oral Q4H PRN Brandy Hale, MD      . ARIPiprazole (ABILIFY) tablet 30 mg  30 mg Oral Q breakfast Jimmy Footman, MD   30 mg at 10/21/15 0811  . ARIPiprazole SUSR 400 mg  400 mg Intramuscular Q30 days Jimmy Footman, MD   400 mg at 10/20/15 2203  . aspirin chewable tablet 81 mg  81 mg Oral Q breakfast Jimmy Footman, MD   81 mg at 10/21/15 0811  . cyanocobalamin ((VITAMIN B-12)) injection 1,000 mcg  1,000 mcg Subcutaneous Daily Jimmy Footman, MD      . fenofibrate tablet 160 mg  160 mg Oral Q breakfast Jimmy Footman, MD   160 mg at 10/21/15 0811  . glipiZIDE (GLUCOTROL) tablet 10 mg  10 mg Oral BID AC Brandy Hale, MD   10 mg at 10/21/15 0643  . guaiFENesin-dextromethorphan (ROBITUSSIN DM) 100-10 MG/5ML syrup 5 mL  5 mL Oral Q4H PRN Jimmy Footman, MD      . levothyroxine (SYNTHROID, LEVOTHROID) tablet 200 mcg  200 mcg Oral QAC breakfast Jimmy Footman, MD   200 mcg at 10/21/15 [phone]  . linagliptin (TRADJENTA) tablet 5 mg  5 mg Oral Q breakfast Jimmy Footman, MD   5 mg at 10/21/15 0811  . lithium carbonate (ESKALITH) CR tablet  450 mg  450 mg Oral Q12H Jimmy Footman, MD   450 mg at 10/21/15 0916  . magnesium hydroxide (MILK OF MAGNESIA) suspension 30 mL  30 mL Oral Daily PRN Brandy Hale, MD      . nicotine (NICODERM CQ - dosed in mg/24 hours) patch 14 mg  14 mg Transdermal Daily Jimmy Footman, MD   14 mg at 10/21/15 0916  . rosuvastatin (CRESTOR) tablet 20 mg  20 mg Oral q1800 Jimmy Footman, MD   20 mg at 10/20/15 1641  . sodium chloride (OCEAN) 0.65 % nasal spray 1 spray  1 spray Each Nare PRN Tiffani L Bell, MD      . traZODone (DESYREL) tablet 150 mg  150 mg Oral QHS Jimmy Footman, MD        Lab Results:  Results for orders placed or performed during the hospital encounter  of 10/11/15 (from the past 48 hour(s))  Glucose, capillary     Status: Abnormal   Collection Time: 10/19/15  4:42 PM  Result Value Ref Range   Glucose-Capillary 60 (L) 65 - 99 mg/dL  Glucose, capillary     Status: None   Collection Time: 10/20/15  6:43 AM  Result Value Ref Range   Glucose-Capillary 84 65 - 99 mg/dL  Lithium level     Status: None   Collection Time: 10/20/15  7:38 AM  Result Value Ref Range   Lithium Lvl 0.84 0.60 - 1.20 mmol/L  Glucose, capillary     Status: None   Collection Time: 10/20/15 11:49 AM  Result Value Ref Range   Glucose-Capillary 89 65 - 99 mg/dL   Comment 1 Notify RN   Glucose, capillary     Status: Abnormal   Collection Time: 10/20/15  4:41 PM  Result Value Ref Range   Glucose-Capillary 63 (L) 65 - 99 mg/dL  Glucose, capillary     Status: None   Collection Time: 10/21/15  6:43 AM  Result Value Ref Range   Glucose-Capillary 85 65 - 99 mg/dL    Blood Alcohol level:  Lab Results  Component Value Date   ETH <5 10/10/2015   ETH <5 06/30/2015    Physical Findings: AIMS: Facial and Oral Movements Muscles of Facial Expression: None, normal Lips and Perioral Area: None, normal Jaw: None, normal Tongue: None, normal,Extremity Movements Upper (arms,  wrists, hands, fingers): None, normal Lower (legs, knees, ankles, toes): None, normal, Trunk Movements Neck, shoulders, hips: None, normal, Overall Severity Severity of abnormal movements (highest score from questions above): None, normal Incapacitation due to abnormal movements: None, normal Patient's awareness of abnormal movements (rate only patient's report): No Awareness, Dental Status Current problems with teeth and/or dentures?: No Does patient usually wear dentures?: No  CIWA:  CIWA-Ar Total: 0 COWS:  COWS Total Score: 0  Musculoskeletal: Strength & Muscle Tone: within normal limits Gait & Station: unsteady Patient leans: N/A  Psychiatric Specialty Exam: Physical Exam  Constitutional: She is oriented to person, place, and time. She appears well-developed and well-nourished.  HENT:  Head: Atraumatic.  Eyes: EOM are normal.  Neck: Normal range of motion.  Respiratory: Effort normal.  Musculoskeletal: Normal range of motion.  Neurological: She is alert and oriented to person, place, and time.    Review of Systems  Constitutional: Negative.   HENT: Negative.   Eyes: Negative.   Respiratory: Negative.   Cardiovascular: Negative.   Gastrointestinal: Negative.   Genitourinary: Negative.   Musculoskeletal: Negative.   Skin: Negative.   Neurological: Negative.   Endo/Heme/Allergies: Negative.   Psychiatric/Behavioral: Negative.     Blood pressure 139/63, pulse 67, temperature 98.2 F (36.8 C), temperature source Oral, resp. rate 20, height 5\' 3"  (1.6 m), weight 122.018 kg (269 lb), SpO2 98 %.Body mass index is 47.66 kg/(m^2).  General Appearance: Disheveled  Eye Contact:  Fair  Speech:  Slow  Volume:  Decreased  Mood:  Depressed, Dysphoric, Hopeless, Irritable and Worthless  Affect:  Depressed and Flat  Thought Process:  Linear  Orientation:  Full (Time, Place, and Person)  Thought Content:  Rumination  Denies hallucination  Suicidal Thoughts:  No  Homicidal  Thoughts:  No  Memory:  Immediate;   Poor Recent;   Poor Remote;   Fair  Judgement:  Poor  Insight:  Lacking  Psychomotor Activity:  Decreased  Concentration:  Concentration: Fair and Attention Span: Fair  Recall:  Poor  Fund of Knowledge:  Poor  Language:  Fair  Akathisia:  No  Handed:    AIMS (if indicated):     Assets:  Desire for Improvement Physical Health Social Support  ADL's:  Intact  Cognition:  Impaired,  Mild  Sleep:  Number of Hours: 6.45      Treatment Plan Summary: Daily contact with patient to assess and evaluate symptoms and progress in treatment and Medication management   Bipolar disorder type I: continue abilify 30 mg and lithium CR 450 mg po bid. Lithium level on 6/5  was 0.84. Pt to receive abilify IM 400 mg on 6/5.    Patient reports feeling better and wanting to be discharged because she has to pay bills. However she does not look better at examination. Her mood appears dysphoric and her affect is blunted. Psychomotor activity is retarded, hygiene and grooming are poor, as a strong body all are in the room. Patient is not coming out of her room much. Participation in group is poor. Says she is not sleeping at night despite receiving high doses of benzodiazepines.  Insomnia: pt says she is not sleeping at night.  Has tried ativan 2 mg and klonopin 2 mg w/o benefit.  Will order trazodone 150 mg qhs  Hypothyroidism: TSH highly elevated due to non compliance.  During last admission a few months ago she was seeing by endocrinology which increased synthroid to .  Patient claims she was compliant with Synthroid at home  Cardiovascular health: continue aspirin 81 mg a day  Dyslipidemia the patient will be continued on Crestor 20 mg a day and  Fenofibrate at 160 mg by mouth daily  Diabetes: I will change patient's diet to low carbohydrate. She will be started on Glucotrol 10 mg by mouth twice a day and tradjenta to 5 mg a day  Possible upper respiratory  tract infection: resolved  Tobacco use disorder: continue nicotine patch 21 mg a day  Gait issues: like shuffling gait.  Will order a walker and order a PT consult.  No medications causing gait issues.  Abilify unlikely to cause parkinsonism----Per PT pt needs rolling walker at home  B12 low levels: on vit inj daily  Hospitalization and status continue involuntary commitment   Precautions every 15 minute checks   Labs: lithium level on 6/5 was 0.84   I certify that inpatient services furnished can reasonably be expected to improve the patient's condition.   Jimmy Footman, MD 10/21/2015, 10:26 AM

## 2015-10-21 NOTE — [facility] Suicide Risk Assessment (Signed)
[facility]   Principal Problem: Bipolar 1 disorder, mixed, severe (HCC) Discharge Diagnoses:  Patient Active Problem List   Diagnosis Date Noted  . GERD (gastroesophageal reflux disease) [K21.9] 10/15/2015  . Bipolar 1 disorder, mixed, severe (HCC) [F31.63] 10/11/2015  . Diabetes type 2, controlled (HCC) [E11.9] 07/07/2015  . Obesity [E66.9] 07/07/2015  . Tobacco use disorder [F17.200] 07/07/2015  . Hypothyroidism [E03.9] 07/07/2015  . HLD (hyperlipidemia) [E78.5] 11/07/2014  . Psoriasis [L40.9] 11/07/2014      Psychiatric Specialty Exam: ROS  Blood pressure 134/51, pulse 60, temperature 98.3 F (36.8 C), temperature source Oral, resp. rate 20, height 5\' 3"  (1.6 m), weight 122.018 kg (269 lb), SpO2 98 %.Body mass index is 47.66 kg/(m^2).                                                       Mental Status Per Nursing Assessment::   On Admission:  NA  Demographic Factors:  Divorced or widowed  Loss Factors: Decline in physical health  Historical Factors: Family history of suicide and Impulsivity  Risk Reduction Factors:   Living with another person, especially a relative  Continued Clinical Symptoms:  Previous Psychiatric Diagnoses and Treatments Medical Diagnoses and Treatments/Surgeries  Cognitive Features That Contribute To Risk:  None    Suicide Risk:  Minimal: No identifiable suicidal ideation.  Patients presenting with no risk factors but with morbid ruminations; may be classified as minimal risk based on the severity of the depressive symptoms  Follow-up Information    [facility]   Why:  Please call the office upon discharge to schedule your hospital follow-up with your primary care Tomi BambergerSusan Fuller   Contact information:   [address] [place] [address] Ph: [phone] Fax: [phone]       Go to [facility].   Why:  Please arrive on June 21st at 11:45am for  therapy with Ulice Boldarson Sarvis and on June 23rd at 2pm for medication management with Anne Fulay Shugart, P.A.  [facility] will call to confirm these dates and times with you.  Please call to reschedule if necessary.   Contact information:   [address], [address] [place], [place]  [address]  Phone [phone] Fax [phone]           Jimmy FootmanHernandez-Gonzalez,  Clarice Zulauf, MD 10/22/2015, 9:04 AM

## 2015-10-22 LAB — GLUCOSE, CAPILLARY: Glucose-Capillary: 104 mg/dL — ABNORMAL HIGH (ref 65–99)

## 2015-10-22 MED ORDER — CLONAZEPAM 1 MG PO TABS: 1.0000 mg | ORAL_TABLET | Freq: Every evening | ORAL | Status: DC | PRN

## 2015-10-22 NOTE — Plan of Care (Signed)
Problem: Education: Goal: Utilization of techniques to improve thought processes will improve Outcome: Not Met (add Reason) Always states "I don't know"

## 2015-10-22 NOTE — Progress Notes (Signed)
Inpatient Diabetes Program Recommendations  AACE/ADA: New Consensus Statement on Inpatient Glycemic Control (2015)  Target Ranges:  Prepandial:   less than 140 mg/dL      Peak postprandial:   less than 180 mg/dL (1-2 hours)      Critically ill patients:  140 - 180 mg/dL   Lab Results  Component Value Date   GLUCAP 104* 10/22/2015   HGBA1C 5.8 07/03/2015    Review of Glycemic ControlResults for Julie DamesCHURCH, Panda B (MRN [id number]) as of 10/22/2015 08:30  Ref. Range 10/21/2015 06:43 10/21/2015 17:38 10/21/2015 18:01 10/22/2015 06:48  Glucose-Capillary Latest Ref Range: 65-99 mg/dL 85 66 72 [id number] (H)   Inpatient Diabetes Program Recommendations:    Note that blood sugars less than 100 mg/dL.  Consider holding Glipiizide due to decreased blood sugars.  Likely needs decrease in outpatient dose.   Thanks, Beryl MeagerJenny Gardner Servantes, RN, BC-ADM Inpatient Diabetes Coordinator Pager [phone] (8a-5p)

## 2015-10-22 NOTE — Progress Notes (Signed)
D: Observed pt in room. Patient alert and oriented x4. Patient denies SI/HI/AVH. Pt affect is depressed. Pt stated he mood is "alright, but upset that the doctor changed my Klonopin." Pt became very focused on not having Klonopin during the interaction. Pt room and pt is malodorous. Pt indicated she was looking forward to discharge to her dad's house. When asked about what she learned from her time here pt stated "nothing."  A: Offered active listening and support. Provided therapeutic communication. Administered scheduled medications. Encouraged pt to attend group. Educated pt on fall safety and use of call light. Offered to help launder clothes and provide bathing supplies. R: Pt cooperative. Pt refused offer to launder clothes or bathe, stating "I'll do it at my dad's house." Pt medication compliant. Will continue Q15 min. checks. Safety maintained.

## 2015-10-22 NOTE — Progress Notes (Signed)
D:  Patient satisfaction survey given to patient by. Ardeth PerfectN Kelly

## 2015-10-22 NOTE — BHH Group Notes (Signed)
BHH LCSW Group Therapy  10/22/2015 2:09 PM  Type of Therapy:  Group Therapy  Participation Level:  Did Not Attend  Modes of Intervention:  Discussion, Education, Socialization and Support  Summary of Progress/Problems: Self esteem: Patients discussed self esteem and how it impacts them. They discussed what aspects in their lives has influenced their self esteem. They were challenged to identify changes that are needed in order to improve self esteem.    Tywan Siever L Brinnley Lacap MSW, LCSWA  10/22/2015, 2:09 PM   

## 2015-10-22 NOTE — Progress Notes (Signed)
Recreation Therapy Notes  Date: 06.07.17 Time: 9:30 am Location: Craft Room  Group Topic: Self-esteem  Goal Area(s) Addresses:  Patient will write at least one positive trait about self. Patient will verbalize benefit of having a healthy self-esteem.  Behavioral Response: Did not attend  Intervention: I Am  Activity: Patients were given a worksheet with the letter I on it and instructed to write as many positive traits inside the letter.  Education: LRT educated patients on ways they can increase their self-esteem.  Education Outcome: Patient did not attend group.  Clinical Observations/Feedback: Patient did not attend group.  Jacquelynn CreeGreene,Anasia Agro M, LRT/CTRS 10/22/2015 10:20 AM

## 2015-10-22 NOTE — Plan of Care (Signed)
Problem: Activity: Goal: Interest or engagement in leisure activities will improve Outcome: Not Met (add Reason) Patient stays to herself.  Unable to verbalize any act ivies interested in.

## 2015-10-22 NOTE — Progress Notes (Signed)
Affect blunted.  Denies SI/HI/AVH.  Shuffling gait. Discharge instructions given, verbalized understanding.  Prescriptions and stored medications given.   Personal belongings returned.  Escorted off unit in wheelchair by this writer to visitor entrance to travel home with daughter.

## 2015-10-22 NOTE — Tx Team (Signed)
Interdisciplinary Treatment Plan Update (Adult)         Date: 10/22/2015   Time Reviewed: 9:30 AM   Progress in Treatment: Improving Attending groups: Intermittently Participating in groups: Intermittently  Taking medication as prescribed: Yes  Tolerating medication: Yes  Family/Significant other contact made: Yes, CSW has spoken with the pt's daughter  Patient understands diagnosis: Yes  Discussing patient identified problems/goals with staff: Yes  Medical problems stabilized or resolved: Yes  Denies suicidal/homicidal ideation: Yes  Issues/concerns per patient self-inventory: Yes  Other:   New problem(s) identified: N/A   Discharge Plan or Barriers: Pt will discharge to her home in [place] to live with her father and will follow up with [facility] in [place] for medication management and therapy and with [facility] for with her primary doctor.   Reason for Continuation of Hospitalization:   Depression   Anxiety   Medication Stabilization   Comments: N/A   Estimated length of stay: 3-5 days     Patient is a . 65 year old Morbidly obese woman admitted for worsening depression, poor sleep, and decreased appetite. Patient states that she has been feeling poorly for a few weeks now. She feels that her medications were working ok but she left two medications (she can not remember which ones) on the table when she went to her daughters house and didn't take those pills.  She feels "bad bad bad." She seem to have difficulty explaining her situation in detail and states her life is generally "great" though she is very depressed.  She has been having racing thoughts , decreased appetite and insomnia. Her mind races and can't fall asleep or stay asleep. Gets 2-3 hours of sleep a night but used to get 6 hours.  Mood is "worried, sad, distraught wondering if I will ever get over this. Last time I was here the medications were fine." She feels her life is  "perfectly fine" and she has good people in her life. She used to spend her days cleaning and cooking but now just lays in bed.  She lives with her father, is on disability for bipolar. She is alert and oriented but when asked her age she initially said "I don't know... Let's see, I was born in [birth year]..." She was able to get her age correct when asked again. Patient appears depressed, has mostly vague responses and poor insight. Patient lives in [place].  Patient will benefit from crisis stabilization, medication evaluation, group therapy, and psycho education in addition to case management for discharge planning. Patient and CSW reviewed pt's identified goals and treatment plan. Pt verbalized understanding and agreed to treatment plan.    Review of initial/current patient goals per problem list:  1. Goal(s): Patient will participate in aftercare plan   Met: Yes  Target date: 3-5 days post admission date   As evidenced by: Patient will participate within aftercare plan AEB aftercare provider and housing plan at discharge being identified.   5/30:  Pt will discharge to her home in [facility] to live with her father and will follow up with [facility] in [place] for medication management and therapy and with [facility] for with her primary doctor.     2. Goal (s): Patient will exhibit decreased depressive symptoms and suicidal ideations.   Met: .adwq  Target date: 3-5 days post admission date   As evidenced by: Patient will utilize self-rating of depression at 3 or below and demonstrate decreased signs of depression  or be deemed stable for discharge by MD.   5/30: Goal progressing.  6/7: Adequate for discharge per MD.  Pt denies SI/HI.  Pt reports she is safe for discharge.   3. Goal(s): Patient will demonstrate decreased signs and symptoms of anxiety.   Met: Adequate for discharge per MD.  Target date: 3-5 days post admission date   As evidenced by:  Patient will utilize self-rating of anxiety at 3 or below and demonstrated decreased signs of anxiety, or be deemed stable for discharge by MD   5/30: Goal progressing  6/7: Adequate for discharge per MD.  Pt reports baseline symptoms.    5. Goal (s): Patient will demonstrate decreased signs of mania  * Met: Adequate for discharge per MD. * Target date: 3-5 days post admission date  * As evidenced by: Patient demonstrate decreased signs of mania AEB decreased mood instability and demonstration of stable mood   *          Target date: at discharge * As evidenced by: Patient will utilize self rating of depression at 3 or below and demonstrate decreased signs of depression or be deemed stable for discharge by MD. * 6/7: Adequate for discharge per MD.  Attendees: Leonie Green Patient:  Family:  Physician: Dr. Jerilee Hoh, MD 10/22/2015 9:30 AM  Nursing: Polly Cobia, RN 10/22/2015 9:30 AM  Clinical Social Worker: Marylou Flesher, [place] 10/22/2015 9:30 AM  Recreational Therapist: Everitt Amber, LRT.10/22/2015 9:30 AM  Other: 10/22/2015 9:30 AM  Other: 10/22/2015 9:30 AM  Other: 10/22/2015 9:30 AM   Alphonse Guild. [place] Retana, LCSWA, LCAS  10/22/15

## 2015-10-22 NOTE — Plan of Care (Signed)
Problem: Activity: Goal: Interest or engagement in leisure activities will improve Outcome: Not Progressing Pt isolative to room this shift.

## 2015-10-22 NOTE — Progress Notes (Signed)
  [facility] :  Will you be returning to the same living situation after discharge:  Yes,  pt [place] At discharge, do you have transportation home?: Yes,  pt will be picked up by her daughter Do you have the ability to pay for your medications: Yes,  pt will be provided with prescriptions at discharge  Release of information consent forms completed and in the chart;  Patient's signature needed at discharge.  Patient to Follow up at: Follow-up Information    Call [facility].   Why:  Please call the office upon discharge to settle your outstanding balance which will then enable you to schedule your hospital follow-up with your primary care physician Tomi BambergerSusan Fuller   Contact information:   [address] [place] [address] Ph: [phone] Fax: [phone]       Go to [facility].   Why:  Please arrive on June 21st at 11:45am for therapy with Ulice Boldarson Sarvis and on June 23rd at 2pm for medication management with Anne Fulay Shugart, P.A.  [facility] will call to confirm these dates and times with you.  Please call to reschedule if necessary.   Contact information:   [address], [address] [place], [place]  [address]  Phone [phone] Fax [phone]          Next level of care provider has access to [facility] Link:no  Safety Planning and Suicide Prevention discussed: No. Pt refused SPE from the CSW  Have you used any form of tobacco in the last 30 days? (Cigarettes, Smokeless Tobacco, Cigars, and/or Pipes): No  Has patient been referred to the Quitline?: N/A patient is not a smoker  Patient has been referred for addiction treatment: N/A  Mercy RidingJonathan F Philisha Weinel 10/22/2015, 1:37 PM

## 2015-10-22 NOTE — [facility] Suicide Risk Assessment (Signed)
[facility] INPATIENT:  Family/Significant Other Suicide Prevention Education  Suicide Prevention Education:  Patient Refusal for Family/Significant Other Suicide Prevention Education: The patient Julie Morrow has refused to provide written consent for family/significant other to be provided Family/Significant Other Suicide Prevention Education during admission and/or prior to discharge.  Physician notified.  Pt refused SPE from the CSW.  Dorothe PeaJonathan F Vici Novick 10/22/2015, 1:17 PM

## 2015-10-27 ENCOUNTER — Observation Stay
Admission: EM | Admit: 2015-10-27 | Discharge: 2015-10-31 | Disposition: A | Discharge status: Skilled Nursing Facility | Payer: Commercial Managed Care - HMO | Attending: Internal Medicine | Admitting: Internal Medicine

## 2015-10-27 ENCOUNTER — Emergency Department: Payer: Commercial Managed Care - HMO

## 2015-10-27 ENCOUNTER — Encounter: Payer: Self-pay | Admitting: Emergency Medicine

## 2015-10-27 DIAGNOSIS — E119 Type 2 diabetes mellitus without complications: Secondary | ICD-10-CM | POA: Insufficient documentation

## 2015-10-27 DIAGNOSIS — L409 Psoriasis, unspecified: Secondary | ICD-10-CM | POA: Insufficient documentation

## 2015-10-27 DIAGNOSIS — R58 Hemorrhage, not elsewhere classified: Secondary | ICD-10-CM

## 2015-10-27 DIAGNOSIS — Z6841 Body Mass Index (BMI) 40.0 and over, adult: Secondary | ICD-10-CM | POA: Insufficient documentation

## 2015-10-27 DIAGNOSIS — R531 Weakness: Secondary | ICD-10-CM | POA: Diagnosis not present

## 2015-10-27 DIAGNOSIS — Z87891 Personal history of nicotine dependence: Secondary | ICD-10-CM | POA: Diagnosis not present

## 2015-10-27 DIAGNOSIS — F419 Anxiety disorder, unspecified: Secondary | ICD-10-CM | POA: Diagnosis not present

## 2015-10-27 DIAGNOSIS — Z7984 Long term (current) use of oral hypoglycemic drugs: Secondary | ICD-10-CM | POA: Insufficient documentation

## 2015-10-27 DIAGNOSIS — E876 Hypokalemia: Secondary | ICD-10-CM | POA: Insufficient documentation

## 2015-10-27 DIAGNOSIS — E669 Obesity, unspecified: Secondary | ICD-10-CM | POA: Insufficient documentation

## 2015-10-27 DIAGNOSIS — K219 Gastro-esophageal reflux disease without esophagitis: Secondary | ICD-10-CM | POA: Insufficient documentation

## 2015-10-27 DIAGNOSIS — E785 Hyperlipidemia, unspecified: Secondary | ICD-10-CM | POA: Diagnosis not present

## 2015-10-27 DIAGNOSIS — E039 Hypothyroidism, unspecified: Secondary | ICD-10-CM

## 2015-10-27 DIAGNOSIS — Z79899 Other long term (current) drug therapy: Secondary | ICD-10-CM | POA: Diagnosis not present

## 2015-10-27 DIAGNOSIS — F3163 Bipolar disorder, current episode mixed, severe, without psychotic features: Secondary | ICD-10-CM | POA: Diagnosis not present

## 2015-10-27 DIAGNOSIS — Z23 Encounter for immunization: Secondary | ICD-10-CM | POA: Diagnosis not present

## 2015-10-27 DIAGNOSIS — Z7982 Long term (current) use of aspirin: Secondary | ICD-10-CM | POA: Diagnosis not present

## 2015-10-27 LAB — URINALYSIS COMPLETE WITH MICROSCOPIC (ARMC ONLY)
Bilirubin Urine: NEGATIVE
GLUCOSE, UA: NEGATIVE mg/dL
HGB URINE DIPSTICK: NEGATIVE
Ketones, ur: NEGATIVE mg/dL
Nitrite: POSITIVE — AB
PROTEIN: NEGATIVE mg/dL
SPECIFIC GRAVITY, URINE: 1.006 (ref 1.005–1.030)
pH: 6 (ref 5.0–8.0)

## 2015-10-27 LAB — CBC
HEMATOCRIT: 44.3 % (ref 35.0–47.0)
HEMOGLOBIN: 14.3 g/dL (ref 12.0–16.0)
MCH: 30.1 pg (ref 26.0–34.0)
MCHC: 32.3 g/dL (ref 32.0–36.0)
MCV: 93 fL (ref 80.0–100.0)
Platelets: 203 10*3/uL (ref 150–440)
RBC: 4.76 MIL/uL (ref 3.80–5.20)
RDW: 16.3 % — AB (ref 11.5–14.5)
WBC: 11.7 10*3/uL — ABNORMAL HIGH (ref 3.6–11.0)

## 2015-10-27 LAB — URINE DRUG SCREEN, QUALITATIVE (ARMC ONLY)
Amphetamines, Ur Screen: NOT DETECTED
BARBITURATES, UR SCREEN: NOT DETECTED
BENZODIAZEPINE, UR SCRN: NOT DETECTED
CANNABINOID 50 NG, UR ~~LOC~~: NOT DETECTED
COCAINE METABOLITE, UR ~~LOC~~: NOT DETECTED
MDMA (Ecstasy)Ur Screen: NOT DETECTED
METHADONE SCREEN, URINE: NOT DETECTED
OPIATE, UR SCREEN: NOT DETECTED
Phencyclidine (PCP) Ur S: NOT DETECTED
Tricyclic, Ur Screen: NOT DETECTED

## 2015-10-27 LAB — BASIC METABOLIC PANEL
ANION GAP: 11 (ref 5–15)
BUN: 9 mg/dL (ref 6–20)
CHLORIDE: 107 mmol/L (ref 101–111)
CO2: 23 mmol/L (ref 22–32)
Calcium: 9.4 mg/dL (ref 8.9–10.3)
Creatinine, Ser: 1.13 mg/dL — ABNORMAL HIGH (ref 0.44–1.00)
GFR calc Af Amer: 58 mL/min — ABNORMAL LOW (ref >60)
GFR calc non Af Amer: 50 mL/min — ABNORMAL LOW (ref >60)
GLUCOSE: 159 mg/dL — AB (ref 65–99)
POTASSIUM: 3.7 mmol/L (ref 3.5–5.1)
Sodium: 141 mmol/L (ref 135–145)

## 2015-10-27 LAB — GLUCOSE, CAPILLARY: GLUCOSE-CAPILLARY: 85 mg/dL (ref 65–99)

## 2015-10-27 LAB — LITHIUM LEVEL: Lithium Lvl: 0.83 mmol/L (ref 0.60–1.20)

## 2015-10-27 LAB — TROPONIN I

## 2015-10-27 LAB — TSH: TSH: 27.047 u[IU]/mL — AB (ref 0.350–4.500)

## 2015-10-27 LAB — T4, FREE: Free T4: 1.04 ng/dL (ref 0.61–1.12)

## 2015-10-27 MED ORDER — LEVOTHYROXINE SODIUM 125 MCG PO TABS
225.0000 ug | ORAL_TABLET | Freq: Every day | ORAL | Status: DC
Start: 2015-10-28 — End: 2015-10-31
  Administered 2015-10-28 – 2015-10-31 (×4): 225 ug via ORAL
  Filled 2015-10-27 (×4): qty 1

## 2015-10-27 MED ORDER — ARIPIPRAZOLE 10 MG PO TABS
30.0000 mg | ORAL_TABLET | Freq: Every day | ORAL | Status: DC
  Administered 2015-10-27 – 2015-10-30 (×4): 30 mg via ORAL
  Filled 2015-10-27 (×2): qty 3
  Filled 2015-10-27: qty 2
  Filled 2015-10-27 (×2): qty 3

## 2015-10-27 MED ORDER — ASPIRIN 81 MG PO CHEW
81.0000 mg | CHEWABLE_TABLET | Freq: Every day | ORAL | Status: DC
  Administered 2015-10-28 – 2015-10-31 (×4): 81 mg via ORAL
  Filled 2015-10-27 (×3): qty 1

## 2015-10-27 MED ORDER — HEPARIN SODIUM (PORCINE) 5000 UNIT/ML IJ SOLN
5000.0000 [IU] | Freq: Three times a day (TID) | INTRAMUSCULAR | Status: DC
  Administered 2015-10-27 – 2015-10-31 (×12): 5000 [IU] via SUBCUTANEOUS
  Filled 2015-10-27 (×11): qty 1

## 2015-10-27 MED ORDER — GLIPIZIDE 10 MG PO TABS
10.0000 mg | ORAL_TABLET | Freq: Two times a day (BID) | ORAL | Status: DC
  Administered 2015-10-28: 10 mg via ORAL
  Filled 2015-10-27: qty 1

## 2015-10-27 MED ORDER — INSULIN ASPART 100 UNIT/ML ~~LOC~~ SOLN
0.0000 [IU] | Freq: Three times a day (TID) | SUBCUTANEOUS | Status: DC
  Administered 2015-10-28: 2 [IU] via SUBCUTANEOUS
  Administered 2015-10-29: 1 [IU] via SUBCUTANEOUS
  Administered 2015-10-29: 2 [IU] via SUBCUTANEOUS
  Administered 2015-10-30 – 2015-10-31 (×3): 1 [IU] via SUBCUTANEOUS
  Administered 2015-10-31: 18:00:00 3 [IU] via SUBCUTANEOUS
  Filled 2015-10-27: qty 1
  Filled 2015-10-27: qty 2
  Filled 2015-10-27: qty 1
  Filled 2015-10-27: qty 2
  Filled 2015-10-27 (×2): qty 1
  Filled 2015-10-27: qty 2

## 2015-10-27 MED ORDER — LINAGLIPTIN 5 MG PO TABS
5.0000 mg | ORAL_TABLET | Freq: Every day | ORAL | Status: DC
  Administered 2015-10-28 – 2015-10-31 (×4): 5 mg via ORAL
  Filled 2015-10-27 (×4): qty 1

## 2015-10-27 MED ORDER — FENOFIBRATE 160 MG PO TABS
160.0000 mg | ORAL_TABLET | Freq: Every day | ORAL | Status: DC
  Administered 2015-10-28: 160 mg via ORAL
  Filled 2015-10-27: qty 1

## 2015-10-27 MED ORDER — CLONAZEPAM 1 MG PO TABS
1.0000 mg | ORAL_TABLET | Freq: Every day | ORAL | Status: DC
  Administered 2015-10-27 – 2015-10-30 (×4): 1 mg via ORAL
  Filled 2015-10-27 (×4): qty 1

## 2015-10-27 MED ORDER — PNEUMOCOCCAL VAC POLYVALENT 25 MCG/0.5ML IJ INJ
0.5000 mL | INJECTION | INTRAMUSCULAR | Status: AC
  Administered 2015-10-29: 0.5 mL via INTRAMUSCULAR
  Filled 2015-10-27: qty 0.5

## 2015-10-27 MED ORDER — LITHIUM CARBONATE ER 300 MG PO TBCR
600.0000 mg | EXTENDED_RELEASE_TABLET | Freq: Every day | ORAL | Status: DC
  Administered 2015-10-27 – 2015-10-29 (×3): 600 mg via ORAL
  Filled 2015-10-27 (×3): qty 2

## 2015-10-27 MED ORDER — ROSUVASTATIN CALCIUM 20 MG PO TABS
20.0000 mg | ORAL_TABLET | Freq: Every day | ORAL | Status: DC
  Administered 2015-10-27: 20 mg via ORAL
  Filled 2015-10-27: qty 1

## 2015-10-27 NOTE — H&P (Addendum)
[facility] [place] [facility]   PATIENT NAME: Julie OresSusan Morrow    MR#:  [id number]  DATE OF BIRTH:  04/23/1951  DATE OF ADMISSION:  10/27/2015  PRIMARY CARE PHYSICIAN: Jerl MinaJames Hedrick, MD   REQUESTING/REFERRING PHYSICIAN: Huel CoteQuigley  CHIEF COMPLAINT:   Chief Complaint  Patient presents with  . Fatigue    HISTORY OF PRESENT ILLNESS: Julie Morrow  is a 65 y.o. female with a known history of Hypothyroidism, depression, anxiety, diabetes, bipolar disorder- was recently admitted to psychiatric the inpatient unit and she stayed there for 10 days and discharged home to her daughter 5 days ago. As per daughter there was some medication changes made during that admission but patient gradually got worse physically in that admission and when she went home she was discharged home with a walker. Before she got admitted last time to psychiatric unit she was able to walk on her own at home. As per daughter patient continued to get worse after she went home for last 5 days in spite of Taking all her medications on time as prescribed, so she was concerned and brought the patient to emergency room back again today. As per daughter patient is also somewhat slow mentally and she has decreased appetite and slow thinking. Sometimes she is slightly confused also. She denies any urinary symptoms or fever or chills. Her TSH level was very high 2 month ago and during last admission in hospital, and as per the psychiatric note her thyroxine dose was changed by endocrinologist 2-3 months ago, and psychiatric is continue the same dose during last admission and discharge. TSH is found to be high again, but less than the previous report and urinalysis is still not collected. Because of patient's generalized weakness and decreased functional status at home and daughter strongly feels she may need some placement for more help at home and so ER physician suggested to admit to medical services for these  issues.  PAST MEDICAL HISTORY:   Past Medical History  Diagnosis Date  . Hypothyroidism   . Depression   . Anxiety   . Diabetes mellitus, type II (HCC)     Patient takes Glucotrol and Januvia  . H/O diabetes mellitus 04/08/2015    borderline diabetes no medications needed.   . Bipolar 1 disorder (HCC)     PAST SURGICAL HISTORY: Past Surgical History  Procedure Laterality Date  . Abdominal hysterectomy      SOCIAL HISTORY:  Social History  Substance Use Topics  . Smoking status: Former Smoker -- 0.25 packs/day for 5 years    Types: Cigarettes    Start date: 11/06/1984  . Smokeless tobacco: Never Used  . Alcohol Use: No    FAMILY HISTORY:  Family History  Problem Relation Age of Onset  . Dementia Mother   . Arthritis Father     DRUG ALLERGIES:  Allergies  Allergen Reactions  . Neomycin-Bacitracin Zn-Polymyx Rash    REVIEW OF SYSTEMS:   CONSTITUTIONAL: No fever, Positive for fatigue or weakness.  EYES: No blurred or double vision.  EARS, NOSE, AND THROAT: No tinnitus or ear pain.  RESPIRATORY: No cough, shortness of breath, wheezing or hemoptysis.  CARDIOVASCULAR: No chest pain, orthopnea, edema.  GASTROINTESTINAL: No nausea, vomiting, diarrhea or abdominal pain.  GENITOURINARY: No dysuria, hematuria.  ENDOCRINE: No polyuria, nocturia,  HEMATOLOGY: No anemia, easy bruising or bleeding SKIN: No rash or lesion. MUSCULOSKELETAL: No joint pain or arthritis.   NEUROLOGIC: No tingling, numbness, weakness.  PSYCHIATRY: No anxiety or  depression.   MEDICATIONS AT HOME:  Prior to Admission medications   Medication Sig Start Date End Date Taking? Authorizing Provider  ARIPiprazole (ABILIFY) 30 MG tablet Take 1 tablet (30 mg total) by mouth at bedtime. 10/21/15   Jimmy Footman, MD  ARIPiprazole 400 MG SUSR Inject 400 mg into the muscle every 30 (thirty) days. 11/18/15   Jimmy Footman, MD  aspirin 81 MG chewable tablet Chew 81 mg by mouth daily.     Historical Provider, MD  clonazePAM (KLONOPIN) 1 MG tablet Take 1 tablet (1 mg total) by mouth at bedtime as needed for anxiety. 10/22/15   Jimmy Footman, MD  fenofibrate 160 MG tablet Take 1 tablet (160 mg total) by mouth daily with breakfast. 10/21/15   Jimmy Footman, MD  glipiZIDE (GLUCOTROL) 10 MG tablet Take 1 tablet (10 mg total) by mouth 2 (two) times daily before a meal. 10/21/15   Jimmy Footman, MD  levothyroxine (SYNTHROID, LEVOTHROID) 200 MCG tablet Take 1 tablet (200 mcg total) by mouth daily before breakfast. 10/21/15   Jimmy Footman, MD  lithium carbonate (LITHOBID) 300 MG CR tablet Take 2 tablets (600 mg total) by mouth at bedtime. 10/21/15   Jimmy Footman, MD  rosuvastatin (CRESTOR) 20 MG tablet Take 1 tablet (20 mg total) by mouth daily at 6 PM. 10/21/15   Jimmy Footman, MD  sitaGLIPtin (JANUVIA) 100 MG tablet Take 1 tablet (100 mg total) by mouth daily. 10/21/15   Jimmy Footman, MD      PHYSICAL EXAMINATION:   VITAL SIGNS: Blood pressure 112/64, pulse 50, temperature 98.3 F (36.8 C), temperature source Oral, resp. rate 16, SpO2 92 %.  GENERAL:  65 y.o.-year-old patient lying in the bed with no acute distress.  EYES: Pupils equal, round, reactive to light and accommodation. No scleral icterus. Extraocular muscles intact.  HEENT: Head atraumatic, normocephalic. Oropharynx and nasopharynx clear.  NECK:  Supple, no jugular venous distention. No thyroid enlargement, no tenderness.  LUNGS: Normal breath sounds bilaterally, no wheezing, rales,rhonchi or crepitation. No use of accessory muscles of respiration.  CARDIOVASCULAR: S1, S2 normal. No murmurs, rubs, or gallops.  ABDOMEN: Soft, nontender, nondistended. Bowel sounds present. No organomegaly or mass.  EXTREMITIES: No pedal edema, cyanosis, or clubbing.  NEUROLOGIC: Cranial nerves II through XII are intact. Muscle strength 4/5 in all extremities. Sensation  intact. Gait not checked. Generalized weakness. PSYCHIATRIC: The patient is alert and oriented x 3.  SKIN: No obvious rash, lesion, or ulcer.   LABORATORY PANEL:   CBC  Recent Labs Lab 10/27/15 1032  WBC 11.7*  HGB 14.3  HCT 44.3  PLT 203  MCV 93.0  MCH 30.1  MCHC 32.3  RDW 16.3*   ------------------------------------------------------------------------------------------------------------------  Chemistries   Recent Labs Lab 10/27/15 1032  NA 141  K 3.7  CL 107  CO2 23  GLUCOSE 159*  BUN 9  CREATININE 1.13*  CALCIUM 9.4   ------------------------------------------------------------------------------------------------------------------ CrCl cannot be calculated (Unknown ideal weight.). ------------------------------------------------------------------------------------------------------------------  Recent Labs  10/27/15 1032  TSH 27.047*     Coagulation profile No results for input(s): INR, PROTIME in the last 168 hours. ------------------------------------------------------------------------------------------------------------------- No results for input(s): DDIMER in the last 72 hours. -------------------------------------------------------------------------------------------------------------------  Cardiac Enzymes  Recent Labs Lab 10/27/15 1032  TROPONINI <0.03   ------------------------------------------------------------------------------------------------------------------ Invalid input(s): POCBNP  ---------------------------------------------------------------------------------------------------------------  Urinalysis    Component Value Date/Time   COLORURINE YELLOW* 02/15/2015 0022   COLORURINE Yellow 10/30/2012 1857   APPEARANCEUR HAZY* 02/15/2015 0022   APPEARANCEUR Cloudy 10/30/2012 1857   LABSPEC 1.024  02/15/2015 0022   LABSPEC 1.019 10/30/2012 1857   PHURINE 5.0 02/15/2015 0022   PHURINE 5.0 10/30/2012 1857   GLUCOSEU 150*  02/15/2015 0022   GLUCOSEU Negative 10/30/2012 1857   HGBUR 3+* 02/15/2015 0022   HGBUR Negative 10/30/2012 1857   BILIRUBINUR NEGATIVE 02/15/2015 0022   BILIRUBINUR Negative 10/30/2012 1857   KETONESUR NEGATIVE 02/15/2015 0022   KETONESUR Negative 10/30/2012 1857   PROTEINUR 30* 02/15/2015 0022   PROTEINUR Negative 10/30/2012 1857   NITRITE POSITIVE* 02/15/2015 0022   NITRITE Positive 10/30/2012 1857   LEUKOCYTESUR TRACE* 02/15/2015 0022   LEUKOCYTESUR Trace 10/30/2012 1857     RADIOLOGY: Dg Chest 2 View  10/27/2015  CLINICAL DATA:  Weakness, shortness of breath, slurred speech, drooling today, type II diabetes mellitus, former smoker EXAM: CHEST  2 VIEW COMPARISON:  10/10/2015 FINDINGS: Enlargement of cardiac silhouette. Mediastinal contours and pulmonary vascularity normal. Minimal RIGHT basilar atelectasis. Lungs otherwise clear. No pleural effusion or pneumothorax. Bones unremarkable. IMPRESSION: Enlargement of cardiac silhouette with minimal RIGHT basilar atelectasis. Electronically Signed   By: Ulyses Southward M.D.   On: 10/27/2015 15:17   Ct Head Wo Contrast  10/27/2015  CLINICAL DATA:  65 year old female with weakness for 3 weeks. EXAM: CT HEAD WITHOUT CONTRAST TECHNIQUE: Contiguous axial images were obtained from the base of the skull through the vertex without intravenous contrast. COMPARISON:  None. FINDINGS: There is postsurgical changes of right suboccipital craniectomy. There is partial resection of the right cerebellar hemisphere. A 2.6 x 2.1 cm area of low-attenuation at the operative bed may represent complex fluid or packing material. Correlation with surgical history recommended. The ventricles and sulci are appropriate in size for patient's age. Minimal periventricular and deep white matter chronic microvascular ischemic changes noted. Small high attenuating internal in the right cerebellar hemisphere superior to the operative bed represent small hemorrhage. There is mild  thickened appearance of the anterior falx measuring 3 mm in thickness. This may represent chronic falx thickening versus minimal parafalcine subdural hemorrhage. Close follow-up recommended. There is no intraventricular hemorrhage. No midline shift noted. The visualized paranasal sinuses and mastoid air cells are clear. IMPRESSION: Right suboccipital craniectomy and postsurgical changes of right cerebellar hemisphere. Focal area of high attenuating superior to the operative bed is concerning for small hemorrhage. Thickening of the anterior falx versus minimal parafalcine subdural hemorrhage. Direct comparison with prior CT scan if available, and Follow-up recommended. These results were called by telephone at the time of interpretation on 10/27/2015 at 11:03 am to Dr. Sharman Cheek , who verbally acknowledged these results. Electronically Signed   By: Elgie Collard M.D.   On: 10/27/2015 11:05    EKG: Orders placed or performed during the hospital encounter of 10/27/15  . ED EKG  . ED EKG  . EKG 12-Lead  . EKG 12-Lead    IMPRESSION AND PLAN:  * Generalized weakness   Assessment she'll level is still elevated but it is better than what it was 2 weeks ago.   I confirmed with daughter about patient's compliance with levothyroxine and taking it empty stomach every morning.   Her last levothyroxine dose change appears to be 3-4 months ago so I will increase the dose for now and advised to check it in next 3-4 weeks.   As per daughter lithium is also a new medicines so I will check the lithium level also.   Urinalysis is still pending collection, we will wait on the results.   Physical therapy evaluation to help with arrangements  on discharge.   CT scan of the head shows some chronic changes and patient has history of old injuries so I would not do any further diagnostic on that.  * hypothyroidism   As above.  * Depression and bipolar   Recent psychiatric admission.   I will continue her  home medications and call psychiatric consult if they need to make any changes.   As per daughter patient got physically more weaker during the last behavior medicine admission and after the discharge.  * Diabetes   Continue home meds, keep on insulin sliding scale coverage.  * Hyperlipidemia   Continue statin.   All the records are reviewed and case discussed with ED provider. Management plans discussed with the patient, family and they are in agreement.  CODE STATUS: Code Status History    Date Active Date Inactive Code Status Order ID Comments User Context   10/11/2015  9:54 PM 10/22/2015  5:46 PM Full Code [id number]  Brandy Hale, MD Inpatient   07/01/2015  7:31 PM 07/08/2015  6:56 PM Full Code [id number]  Jimmy Footman, MD Inpatient   06/30/2015  8:10 PM 07/01/2015  7:31 PM Full Code [id number]  Lorre Nick, MD ED       TOTAL TIME TAKING CARE OF THIS PATIENT: 50 minutes.    Altamese Dilling M.D on 10/27/2015   Between 7am to 6pm - Pager - [phone]  After 6pm go to [email or web address] - password [email or web address]  [facility]  Office  [phone]  CC: Primary care physician; Jerl Mina, MD   Note: This dictation was prepared with Dragon dictation along with smaller phrase technology. Any transcriptional errors that result from this process are unintentional.

## 2015-10-27 NOTE — ED Notes (Signed)
Pt states she was recently in behavioral health hospital 2 weeks ago and started on some new medications, family states since she was brought home pt has been weak and lethargic, pt denies any pain at this time and only complaint is "saliva coming out of her mouth", family at bedside, pt on monitor

## 2015-10-27 NOTE — ED Notes (Signed)
Pt presents with weakness for three weeks and shortness of breath.
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
## 2015-10-27 NOTE — ED Notes (Signed)
Pt o2 sat 89% on RA, placed on 2L [place]

## 2015-10-28 ENCOUNTER — Observation Stay: Payer: Commercial Managed Care - HMO

## 2015-10-28 DIAGNOSIS — R531 Weakness: Secondary | ICD-10-CM | POA: Diagnosis not present

## 2015-10-28 LAB — BASIC METABOLIC PANEL
ANION GAP: 8 (ref 5–15)
BUN: 10 mg/dL (ref 6–20)
CHLORIDE: 109 mmol/L (ref 101–111)
CO2: 26 mmol/L (ref 22–32)
CREATININE: 1.02 mg/dL — AB (ref 0.44–1.00)
Calcium: 9.2 mg/dL (ref 8.9–10.3)
GFR calc non Af Amer: 57 mL/min — ABNORMAL LOW (ref >60)
GLUCOSE: 110 mg/dL — AB (ref 65–99)
Potassium: 3.4 mmol/L — ABNORMAL LOW (ref 3.5–5.1)
Sodium: 143 mmol/L (ref 135–145)

## 2015-10-28 LAB — CK: Total CK: 36 U/L — ABNORMAL LOW (ref 38–234)

## 2015-10-28 LAB — GLUCOSE, CAPILLARY
GLUCOSE-CAPILLARY: 81 mg/dL (ref 65–99)
GLUCOSE-CAPILLARY: 114 mg/dL — AB (ref 65–99)
Glucose-Capillary: 135 mg/dL — ABNORMAL HIGH (ref 65–99)
Glucose-Capillary: 99 mg/dL (ref 65–99)
Glucose-Capillary: 54 mg/dL — ABNORMAL LOW (ref 65–99)

## 2015-10-28 LAB — CBC
HCT: 42.4 % (ref 35.0–47.0)
HEMOGLOBIN: 14.2 g/dL (ref 12.0–16.0)
MCH: 30.9 pg (ref 26.0–34.0)
MCHC: 33.5 g/dL (ref 32.0–36.0)
MCV: 92.4 fL (ref 80.0–100.0)
Platelets: 189 10*3/uL (ref 150–440)
RBC: 4.59 MIL/uL (ref 3.80–5.20)
RDW: 16.7 % — ABNORMAL HIGH (ref 11.5–14.5)
WBC: 9.3 10*3/uL (ref 3.6–11.0)

## 2015-10-28 LAB — C-REACTIVE PROTEIN: CRP: 0.8 mg/dL (ref <1.0)

## 2015-10-28 LAB — SEDIMENTATION RATE: Sed Rate: 27 mm/hr (ref 0–30)

## 2015-10-28 MED ORDER — POTASSIUM CHLORIDE 20 MEQ/15ML (10%) PO SOLN
40.0000 meq | Freq: Once | ORAL | Status: AC
  Administered 2015-10-28: 11:00:00 40 meq via ORAL
  Filled 2015-10-28: qty 30

## 2015-10-28 MED ORDER — VITAMIN B-12 1000 MCG PO TABS
1000.0000 ug | ORAL_TABLET | Freq: Every day | ORAL | Status: DC
  Administered 2015-10-28 – 2015-10-31 (×4): 1000 ug via ORAL
  Filled 2015-10-28 (×4): qty 1

## 2015-10-28 NOTE — Progress Notes (Signed)
Initial Nutrition Assessment  DOCUMENTATION CODES:   Morbid obesity  INTERVENTION:  -Monitor intake and cater to pt preferences within diet restrictions -If unable to meet nutritional needs will add supplement -On follow-up will assess need for diet education, pt lethargic not appropriate today.   NUTRITION DIAGNOSIS:   Inadequate oral intake related to acute illness as evidenced by per patient/family report.    GOAL:   Patient will meet greater than or equal to 90% of their needs    MONITOR:   PO intake  REASON FOR ASSESSMENT:   Malnutrition Screening Tool    ASSESSMENT:      Pt admitted with fatigue, weakness, possible CVA, hypothyroidism. Noted recent admission to Behavioral Health unit  Past Medical History  Diagnosis Date  . Hypothyroidism   . Depression   . Anxiety   . Diabetes mellitus, type II (HCC)     Patient takes Glucotrol and Januvia  . H/O diabetes mellitus 04/08/2015    borderline diabetes no medications needed.   . Bipolar 1 disorder (HCC)    Pt reports poor po intake for the past 4-5 weeks. Reports eating about 1/2 of normal intake.  Pt lethargic, slow to respond this am.  Pt reports eating most of [place] sausage this am, 1/2 of egg, 100% of juice and coffee.    Medications reviewed: glipzide, aspart, Vit B 12 Labs reviewed: K 3.4, creatinine 1.02, glucose 110  Nutrition-Focused physical exam completed. Findings are WDL for fat depletion, muscle depletion, and edema.    Diet Order:  Diet heart healthy/carb modified Room service appropriate?: Yes; Fluid consistency:: Thin  Skin:  Reviewed, no issues  Last BM:  6/12  Height:   Ht Readings from Last 1 Encounters:  10/27/15 5\' 3"  (1.6 m)    Weight: Reports UBW of 267 pounds and performed bed wt during visit today and noted 267 pounds  Wt Readings from Last 1 Encounters:  10/27/15 267 lb 14.4 oz (121.519 kg)    Ideal Body Weight:     BMI:  Body mass index is 47.47  kg/(m^2).  Estimated Nutritional Needs:   Kcal:  [id number] kcals/d  Protein:  86-112 g/d  Fluid:  1.7-2.2 L/d  EDUCATION NEEDS:   Education needs no appropriate at this time  Lorey Pallett B. Freida BusmanAllen, RD, LDN [phone] (pager) Weekend/On-Call pager [phone])

## 2015-10-28 NOTE — Progress Notes (Addendum)
[facility] - [place] [facility]   PATIENT NAME: Julie Morrow    MR#:  [id number]  DATE OF BIRTH:  [birth year]  SUBJECTIVE:   Patient here with weakness  REVIEW OF SYSTEMS:    Review of Systems  Constitutional: Positive for malaise/fatigue. Negative for fever and chills.  HENT: Negative for ear discharge, ear pain, hearing loss, nosebleeds and sore throat.   Eyes: Negative for blurred vision and pain.  Respiratory: Negative for cough, hemoptysis, shortness of breath and wheezing.   Cardiovascular: Negative for chest pain, palpitations and leg swelling.  Gastrointestinal: Negative for nausea, vomiting, abdominal pain, diarrhea and blood in stool.  Genitourinary: Negative for dysuria.  Musculoskeletal: Negative for back pain.  Neurological: Positive for weakness. Negative for dizziness, tremors, speech change, focal weakness, seizures and headaches.  Endo/Heme/Allergies: Does not bruise/bleed easily.  Psychiatric/Behavioral: Negative for depression, suicidal ideas and hallucinations.    Tolerating Diet: yes      DRUG ALLERGIES:   Allergies  Allergen Reactions  . Neomycin-Bacitracin Zn-Polymyx Rash    VITALS:  Blood pressure 132/70, pulse 56, temperature 97.6 F (36.4 C), temperature source Oral, resp. rate 17, height 5\' 3"  (1.6 m), weight 121.519 kg (267 lb 14.4 oz), SpO2 99 %.  PHYSICAL EXAMINATION:   Physical Exam  Constitutional: She is oriented to person, place, and time and well-developed, well-nourished, and in no distress. No distress.  obese  HENT:  Head: Normocephalic.  Eyes: No scleral icterus.  Neck: Normal range of motion. Neck supple. No JVD present. No tracheal deviation present. Thyromegaly present.  Cardiovascular: Normal rate, regular rhythm and normal heart sounds.  Exam reveals no gallop and no friction rub.   No murmur heard. Pulmonary/Chest: Effort normal and breath sounds normal. No respiratory distress. She has no wheezes.  She has no rales. She exhibits no tenderness.  Abdominal: Soft. Bowel sounds are normal. She exhibits no distension and no mass. There is no tenderness. There is no rebound and no guarding.  Musculoskeletal: Normal range of motion. She exhibits no edema.  3/5 b/l LE  Neurological: She is alert and oriented to person, place, and time. She displays normal reflexes. A cranial nerve deficit is present. She exhibits normal muscle tone. Coordination abnormal.  Skin: Skin is warm. No rash noted. No erythema.  Psychiatric:  Flat affect      LABORATORY PANEL:   CBC  Recent Labs Lab 10/28/15 0449  WBC 9.3  HGB 14.2  HCT 42.4  PLT 189   ------------------------------------------------------------------------------------------------------------------  Chemistries   Recent Labs Lab 10/28/15 0449  NA 143  K 3.4*  CL 109  CO2 26  GLUCOSE 110*  BUN 10  CREATININE 1.02*  CALCIUM 9.2   ------------------------------------------------------------------------------------------------------------------  Cardiac Enzymes  Recent Labs Lab 10/27/15 1032  TROPONINI <0.03   ------------------------------------------------------------------------------------------------------------------  RADIOLOGY:  Dg Chest 2 View  10/27/2015  CLINICAL DATA:  Weakness, shortness of breath, slurred speech, drooling today, type II diabetes mellitus, former smoker EXAM: CHEST  2 VIEW COMPARISON:  10/10/2015 FINDINGS: Enlargement of cardiac silhouette. Mediastinal contours and pulmonary vascularity normal. Minimal RIGHT basilar atelectasis. Lungs otherwise clear. No pleural effusion or pneumothorax. Bones unremarkable. IMPRESSION: Enlargement of cardiac silhouette with minimal RIGHT basilar atelectasis. Electronically Signed   By: Ulyses Southward M.D.   On: 10/27/2015 15:17   Ct Head Wo Contrast  10/28/2015  CLINICAL DATA:  Generalized weakness for several weeks EXAM: CT HEAD WITHOUT CONTRAST TECHNIQUE:  Contiguous axial images were obtained from the base of the skull  through the vertex without intravenous contrast. 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 Postsurgical changes are noted in the right cerebellar hemisphere consistent with the prior surgery. No focus of increased attenuation is identified to suggest acute hemorrhage. The falx is again mildly prominent but stable from the prior exam. No other findings to suggest acute hemorrhage are noted. No findings of acute infarct or other space-occupying mass lesion are seen. IMPRESSION: Postsurgical changes are noted.  No definitive hemorrhage is seen. Electronically Signed   By: Alcide CleverMark  Lukens M.D.   On: 10/28/2015 09:21   Ct Head Wo Contrast  10/27/2015  CLINICAL DATA:  65 year old female with weakness for 3 weeks. EXAM: CT HEAD WITHOUT CONTRAST TECHNIQUE: Contiguous axial images were obtained from the base of the skull through the vertex without intravenous contrast. COMPARISON:  None. FINDINGS: There is postsurgical changes of right suboccipital craniectomy. There is partial resection of the right cerebellar hemisphere. A 2.6 x 2.1 cm area of low-attenuation at the operative bed may represent complex fluid or packing material. Correlation with surgical history recommended. The ventricles and sulci are appropriate in size for patient's age. Minimal periventricular and deep white matter chronic microvascular ischemic changes noted. Small high attenuating internal in the right cerebellar hemisphere superior to the operative bed represent small hemorrhage. There is mild thickened appearance of the anterior falx measuring 3 mm in thickness. This may represent chronic falx thickening versus minimal parafalcine subdural hemorrhage. Close follow-up recommended. There is no intraventricular hemorrhage. No midline shift noted. The visualized paranasal sinuses and mastoid air cells are clear. IMPRESSION:  Right suboccipital craniectomy and postsurgical changes of right cerebellar hemisphere. Focal area of high attenuating superior to the operative bed is concerning for small hemorrhage. Thickening of the anterior falx versus minimal parafalcine subdural hemorrhage. Direct comparison with prior CT scan if available, and Follow-up recommended. These results were called by telephone at the time of interpretation on 10/27/2015 at 11:03 am to Dr. Sharman CheekPHILLIP STAFFORD , who verbally acknowledged these results. Electronically Signed   By: Elgie CollardArash  Radparvar M.D.   On: 10/27/2015 11:05     ASSESSMENT AND PLAN:    65 year old female with a history of hypogonadism, depression and bipolar who was recently admitted to psychiatric unit who presented with generalized weakness.  1. Generalized weakness: This is likely due to deconditioning. Significant hypothyroidism may also cause deconditioning and generalized weakness. Initial CT scan was questionable concerning for subdural hemorrhage. Repeat CT scan this morning does not show evidence of subdural hemorrhage. There is no evidence of acute stroke. Physical therapy and case management for disposition. Lithium level was within normal limits. Check CK level as patient is on fenofibrate and statin. 2. Hypothyroidism: Continue current increased dose of Synthroid with repeat thyroid function tests in 4 weeks.  3. Depression and bipolar disorder: Patient is on lithium and has diagnosis of hypothyroid. We'll have psychiatry consult on possibly changing medications.  4. Diabetes: Continue glipizide, TRADJENTA and sliding scale insulin with ADA diet.  5. HLD check CK levels on statin Consider stopping statin due to weakness.  6. Hypokalemia: replete PT consult for disposition. Management plans discussed with the patient and she is in agreement.  CODE STATUS: FULL  TOTAL TIME TAKING CARE OF THIS PATIENT: 30 minutes.     POSSIBLE D/C tomorrow, DEPENDING ON  CLINICAL CONDITION.   Aleisa Howk M.D on 10/28/2015 at 10:33 AM  Between 7am to 6pm - Pager - [phone] After 6pm go to [email or web address] -  password [email or web address]  [facility]  Office  [phone]  CC: Primary care physician; Jerl Mina, MD  Note: This dictation was prepared with Dragon dictation along with smaller phrase technology. Any transcriptional errors that result from this process are unintentional.

## 2015-10-28 NOTE — Consult Note (Signed)
   [facility]   10/28/2015  Arther DamesSusan B Biggar 1950-12-10 [id number]   Patient screened for potential [facility] Care Management services. Patient is eligible for [facility] Services. Spoke with inpatient case manager who felt [facility] not appropriate at this time. If patient's post hospital needs change please place a [facility]. For questions please contact:   Jaquell Seddon RN, BSN [facility]  Hospital Liaison  [phone]) Business Mobile [phone]) Toll free office

## 2015-10-28 NOTE — Progress Notes (Signed)
Patient VC 1.8 with poor effort, NIF -30 with fair effort.

## 2015-10-28 NOTE — Consult Note (Addendum)
Reason for Consult:Weakness Referring Physician: Mody   CC: Weakness  HPI: Julie Morrow is an 65 y.o. female with a known history of hypothyroidism, depression, anxiety, diabetes, bipolar disorder who was recently admitted to [place] and she stayed there for 10 days.  Was discharged home to her daughter 5 days ago.  As per daughter there was some medication changes made during that admission but patient gradually got worse physically in that admission and when she went home she was discharged home with a walker.  She previously required no assistive device for ambulation.   As per daughter patient continued to get worse after she went home and for last 5 days in spite of taking all her medications on time as prescribed.  As per daughter patient is also somewhat slow mentally and she has decreased appetite and slow thinking. Sometimes she is slightly confused also. Patient unable to provide accurate history and family not present at this time therefore all history obtained from the chart.    Past Medical History  Diagnosis Date  . Hypothyroidism   . Depression   . Anxiety   . Diabetes mellitus, type II ([place])     Patient takes Glucotrol and Januvia  . H/O diabetes mellitus 04/08/2015    borderline diabetes no medications needed.   . Bipolar 1 disorder [facility])     Past Surgical History  Procedure Laterality Date  . Abdominal hysterectomy      Family History  Problem Relation Age of Onset  . Dementia Mother   . Arthritis Father     Social History:  reports that she has quit smoking. Her smoking use included Cigarettes. She started smoking about 30 years ago. She has a 1.25 pack-year smoking history. She has never used smokeless tobacco. She reports that she does not drink alcohol or use illicit drugs.  Allergies  Allergen Reactions  . Neomycin-Bacitracin Zn-Polymyx Rash    Medications:  I have reviewed the patient's current medications. Prior to Admission:  Prescriptions prior to  admission  Medication Sig Dispense Refill Last Dose  . ARIPiprazole (ABILIFY) 30 MG tablet Take 1 tablet (30 mg total) by mouth at bedtime. 30 tablet 0 10/26/2015 at Unknown time  . aspirin 81 MG chewable tablet Chew 81 mg by mouth daily.   10/27/2015 at [place]   . clonazePAM (KLONOPIN) 1 MG tablet Take 1 tablet (1 mg total) by mouth at bedtime as needed for anxiety. 30 tablet 0 10/26/2015 at Unknown time  . fenofibrate 160 MG tablet Take 1 tablet (160 mg total) by mouth daily with breakfast. 30 tablet 0 10/27/2015 at Unknown time  . glipiZIDE (GLUCOTROL) 10 MG tablet Take 1 tablet (10 mg total) by mouth 2 (two) times daily before a meal. 60 tablet 0 10/27/2015 at Unknown time  . levothyroxine (SYNTHROID, LEVOTHROID) 200 MCG tablet Take 1 tablet (200 mcg total) by mouth daily before breakfast. 30 tablet 0 10/27/2015 at Unknown time  . lithium carbonate (LITHOBID) 300 MG CR tablet Take 2 tablets (600 mg total) by mouth at bedtime. 60 tablet 0 10/26/2015 at Unknown time  . rosuvastatin (CRESTOR) 20 MG tablet Take 1 tablet (20 mg total) by mouth daily at 6 PM. 30 tablet 0 10/26/2015 at Unknown time  . sitaGLIPtin (JANUVIA) 100 MG tablet Take 1 tablet (100 mg total) by mouth daily. 30 tablet 0 10/27/2015 at Unknown time  . [START ON 11/18/2015] ARIPiprazole 400 MG SUSR Inject 400 mg into the muscle every 30 (thirty) days. 1 each  0    Scheduled: . ARIPiprazole  30 mg Oral QHS  . aspirin  81 mg Oral Daily  . clonazePAM  1 mg Oral QHS  . fenofibrate  160 mg Oral Q breakfast  . glipiZIDE  10 mg Oral BID AC  . heparin  5,000 Units Subcutaneous Q8H  . insulin aspart  0-9 Units Subcutaneous TID WC  . levothyroxine  225 mcg Oral QAC breakfast  . linagliptin  5 mg Oral Daily  . lithium carbonate  600 mg Oral QHS  . pneumococcal 23 valent vaccine  0.5 mL Intramuscular Tomorrow-1000  . vitamin B-12  1,000 mcg Oral Daily    ROS: History obtained from chart review and patient  General ROS: negative for - chills,  fatigue, fever, night sweats, weight gain or weight loss Psychological ROS: as noted in HPI Ophthalmic ROS: negative for - blurry vision, double vision, eye pain or loss of vision ENT ROS: negative for - epistaxis, nasal discharge, oral lesions, sore throat, tinnitus or vertigo Allergy and Immunology ROS: negative for - hives or itchy/watery eyes Hematological and Lymphatic ROS: negative for - bleeding problems, bruising or swollen lymph nodes Endocrine ROS: negative for - galactorrhea, hair pattern changes, polydipsia/polyuria or temperature intolerance Respiratory ROS: shortness of breath  Cardiovascular ROS: negative for - chest pain, dyspnea on exertion, edema or irregular heartbeat Gastrointestinal ROS: negative for - abdominal pain, diarrhea, hematemesis, nausea/vomiting or stool incontinence Genito-Urinary ROS: negative for - dysuria, hematuria, incontinence or urinary frequency/urgency Musculoskeletal ROS: negative for - joint swelling or muscular weakness Neurological ROS: as noted in HPI Dermatological ROS: negative for rash and skin lesion changes  Physical Examination: Blood pressure 115/69, pulse 50, temperature 98.7 F (37.1 C), temperature source Oral, resp. rate 22, height _0  (1.6 m), weight 121.519 kg (267 lb 14.4 oz), SpO2 97 %.  HEENT-  Normocephalic, no lesions, without obvious abnormality.  Normal external eye and conjunctiva.  Normal TM's bilaterally.  Normal auditory canals and external ears. Normal external nose, mucus membranes and septum.  Normal pharynx. Cardiovascular- S1, S2 normal, pulses palpable throughout   Lungs- chest clear, no wheezing, rales, normal symmetric air entry Abdomen- soft, non-tender; bowel sounds normal; no masses,  no organomegaly Extremities- mild edema Lymph-no adenopathy palpable Musculoskeletal-no joint tenderness, deformity or swelling Skin-warm and dry, no hyperpigmentation, vitiligo, or suspicious lesions  Neurological  Examination Mental Status: Alert, oriented, thought content appropriate.  Speech fluent without evidence of aphasia.  Able to follow 3 step commands without difficulty. Cranial Nerves: II: Discs flat bilaterally; Visual fields grossly normal, pupils equal, round, reactive to light and accommodation III,IV, VI: ptosis not present, extra-ocular motions intact bilaterally V,VII: smile symmetric, facial light touch sensation normal bilaterally VIII: hearing normal bilaterally IX,X: gag reflex present XI: bilateral shoulder shrug XII: midline tongue extension Motor: Right : Upper extremity   5/5    Left:     Upper extremity   5/5  Lower extremity   5/5     Lower extremity   5/5 Tone and bulk:normal tone throughout; no atrophy noted Sensory: Pinprick and light touch intact throughout, bilaterally Deep Tendon Reflexes: 2+ and symmetric throughout Plantars: Right: downgoing   Left: downgoing Cerebellar: Normal finger-to-nose and normal heel-to-shin testing bilaterally Gait: not tested due to safety concerns   Laboratory Studies:   Basic Metabolic Panel:  Recent Labs Lab 10/27/15 1032 10/28/15 0449  NA 141 143  K 3.7 3.4*  CL 107 109  CO2 23 26  GLUCOSE 159* 110*  BUN 9 10  CREATININE 1.13* 1.02*  CALCIUM 9.4 9.2    Liver Function Tests: No results for input(s): AST, ALT, ALKPHOS, BILITOT, PROT, ALBUMIN in the last 168 hours. No results for input(s): LIPASE, AMYLASE in the last 168 hours. No results for input(s): AMMONIA in the last 168 hours.  CBC:  Recent Labs Lab 10/27/15 1032 10/28/15 0449  WBC 11.7* 9.3  HGB 14.3 14.2  HCT 44.3 42.4  MCV 93.0 92.4  PLT 203 189    Cardiac Enzymes:  Recent Labs Lab 10/27/15 1032 10/28/15 0449  CKTOTAL  --  36*  TROPONINI <0.03  --     BNP: Invalid input(s): POCBNP  CBG:  Recent Labs Lab 10/21/15 1801 10/22/15 0648 10/27/15 1715 10/28/15 0802 10/28/15 1204  GLUCAP 72 104* 85 135* 99     Microbiology: Results for orders placed or performed in visit on 06/16/11  Stool culture     Status: None   Collection Time: 06/17/11  9:12 PM  Result Value Ref Range Status   Micro Text Report   Final       COMMENT                   NO SALMONELLA OR SHIGELLA ISOLATED IN 3 DAYS   COMMENT                   NO PATHOGENIC E.COLI DETECTED   COMMENT                   NO CAMPYLOBACTER ANTIGEN DETECTED   ANTIBIOTIC                                                      Clostridium Difficile by PCR     Status: None   Collection Time: 06/17/11  9:12 PM  Result Value Ref Range Status   Micro Text Report   Final       COMMENT                   NEGATIVE-CLOS.DIFFICILE TOXIN NOT DETECTED BY PCR   ANTIBIOTIC                                                        Coagulation Studies: No results for input(s): LABPROT, INR in the last 72 hours.  Urinalysis:  Recent Labs Lab 10/27/15 2158  COLORURINE YELLOW*  LABSPEC 1.006  PHURINE 6.0  GLUCOSEU NEGATIVE  HGBUR NEGATIVE  BILIRUBINUR NEGATIVE  KETONESUR NEGATIVE  PROTEINUR NEGATIVE  NITRITE POSITIVE*  LEUKOCYTESUR 2+*    Lipid Panel:     Component Value Date/Time   CHOL 175 07/03/2015 0738   CHOL 248* 06/17/2011 0603   TRIG 298* 07/03/2015 0738   TRIG 682* 06/17/2011 0603   HDL 22* 07/03/2015 0738   HDL 20* 06/17/2011 0603   CHOLHDL 8.0 07/03/2015 0738   VLDL 60* 07/03/2015 0738   VLDL SEE COMMENT 06/17/2011 0603   LDLCALC 93 07/03/2015 0738   LDLCALC SEE COMMENT 06/17/2011 0603    HgbA1C:  Lab Results  Component Value Date   HGBA1C 5.8 07/03/2015    Urine Drug Screen:  Component Value Date/Time   LABOPIA NONE DETECTED 10/27/2015 2158   LABOPIA NONE DETECTED 07/01/2015 0822   COCAINSCRNUR NONE DETECTED 10/27/2015 2158   COCAINSCRNUR NONE DETECTED 07/01/2015 0822   LABBENZ NONE DETECTED 10/27/2015 2158   LABBENZ NONE DETECTED 07/01/2015 0822   AMPHETMU NONE DETECTED 10/27/2015 2158   AMPHETMU NONE  DETECTED 07/01/2015 0822   THCU NONE DETECTED 10/27/2015 2158   THCU NONE DETECTED 07/01/2015 0822   LABBARB NONE DETECTED 10/27/2015 2158   LABBARB NONE DETECTED 07/01/2015 0822    Alcohol Level: No results for input(s): ETH in the last 168 hours.  Other results: EKG: junctional rhythm at 64 bpm.  Imaging: Dg Chest 2 View  10/27/2015  CLINICAL DATA:  Weakness, shortness of breath, slurred speech, drooling today, type II diabetes mellitus, former smoker EXAM: CHEST  2 VIEW COMPARISON:  10/10/2015 FINDINGS: Enlargement of cardiac silhouette. Mediastinal contours and pulmonary vascularity normal. Minimal RIGHT basilar atelectasis. Lungs otherwise clear. No pleural effusion or pneumothorax. Bones unremarkable. IMPRESSION: Enlargement of cardiac silhouette with minimal RIGHT basilar atelectasis. Electronically Signed   By: Lavonia Dana M.D.   On: 10/27/2015 15:17   Ct Head Wo Contrast  10/28/2015  CLINICAL DATA:  Generalized weakness for several weeks EXAM: CT HEAD WITHOUT CONTRAST TECHNIQUE: Contiguous axial images were obtained from the base of the skull through the vertex without intravenous contrast. 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 Postsurgical changes are noted in the right cerebellar hemisphere consistent with the prior surgery. No focus of increased attenuation is identified to suggest acute hemorrhage. The falx is again mildly prominent but stable from the prior exam. No other findings to suggest acute hemorrhage are noted. No findings of acute infarct or other space-occupying mass lesion are seen. IMPRESSION: Postsurgical changes are noted.  No definitive hemorrhage is seen. Electronically Signed   By: Inez Catalina M.D.   On: 10/28/2015 09:21   Ct Head Wo Contrast  10/27/2015  CLINICAL DATA:  65 year old female with weakness for 3 weeks. EXAM: CT HEAD WITHOUT CONTRAST TECHNIQUE: Contiguous axial images were obtained from  the base of the skull through the vertex without intravenous contrast. COMPARISON:  None. FINDINGS: There is postsurgical changes of right suboccipital craniectomy. There is partial resection of the right cerebellar hemisphere. A 2.6 x 2.1 cm area of low-attenuation at the operative bed may represent complex fluid or packing material. Correlation with surgical history recommended. The ventricles and sulci are appropriate in size for patient's age. Minimal periventricular and deep white matter chronic microvascular ischemic changes noted. Small high attenuating internal in the right cerebellar hemisphere superior to the operative bed represent small hemorrhage. There is mild thickened appearance of the anterior falx measuring 3 mm in thickness. This may represent chronic falx thickening versus minimal parafalcine subdural hemorrhage. Close follow-up recommended. There is no intraventricular hemorrhage. No midline shift noted. The visualized paranasal sinuses and mastoid air cells are clear. IMPRESSION: Right suboccipital craniectomy and postsurgical changes of right cerebellar hemisphere. Focal area of high attenuating superior to the operative bed is concerning for small hemorrhage. Thickening of the anterior falx versus minimal parafalcine subdural hemorrhage. Direct comparison with prior CT scan if available, and Follow-up recommended. These results were called by telephone at the time of interpretation on 10/27/2015 at 11:03 am to Dr. Carrie Mew , who verbally acknowledged these results. Electronically Signed   By: Anner Crete M.D.   On: 10/27/2015 11:05     Assessment/Plan: 65 year old  female with thyroid abnormalities who presents with weakness and decreased functional status.  Has had recent psych admission with further decline after discharge.  TSH remains elevated and there remains a possibility that decline is related to thyroid abnormalities since not yet at normal levels.  CK is not  elevated and patient does not complain of pain.  No weakness noted on examinatino but patient has functionally declined and complains of difficulty breathing as well.  Head CT personally reviewed and although there was some concern for [facility] initially, repeat suggests this is not the case.  Do not suspect SAH as etiology of patient's presentation.  Further work up recommended.   Lithium level and B12 normal but low normal.    Recommendations: 1.  ESR, CRP, myasthenia panel, CMP 2.  NIF, VC 3.  D/C lipid lowering agents.   4.  Should benefit from B12 supplementation 5.  PT evaluation  Alexis Goodell, MD Neurology [phone] 10/28/2015, 1:42 PM

## 2015-10-28 NOTE — Progress Notes (Signed)
Dr.Konidena notified of fsbs, ordered to D/C glyburide

## 2015-10-28 NOTE — Progress Notes (Signed)
Was made aware of pts HR dropping to 39 two times during shift.  Both times pt was sleeping and experienced pvcs and junctional escape beats.  Upon physical assessment of pt, she appears to just be in a deep sleep. No complaints of pain.  Only complaint is of slurred speech.

## 2015-10-29 DIAGNOSIS — F3163 Bipolar disorder, current episode mixed, severe, without psychotic features: Secondary | ICD-10-CM | POA: Diagnosis not present

## 2015-10-29 DIAGNOSIS — R531 Weakness: Secondary | ICD-10-CM | POA: Diagnosis not present

## 2015-10-29 LAB — GLUCOSE, CAPILLARY
GLUCOSE-CAPILLARY: 106 mg/dL — AB (ref 65–99)
GLUCOSE-CAPILLARY: 155 mg/dL — AB (ref 65–99)
GLUCOSE-CAPILLARY: 130 mg/dL — AB (ref 65–99)
GLUCOSE-CAPILLARY: 105 mg/dL — AB (ref 65–99)

## 2015-10-29 LAB — COMPREHENSIVE METABOLIC PANEL
ALT: 15 U/L (ref 14–54)
ANION GAP: 6 (ref 5–15)
AST: 19 U/L (ref 15–41)
Albumin: 3.8 g/dL (ref 3.5–5.0)
Alkaline Phosphatase: 80 U/L (ref 38–126)
BUN: 11 mg/dL (ref 6–20)
CHLORIDE: 108 mmol/L (ref 101–111)
CO2: 28 mmol/L (ref 22–32)
Calcium: 9.3 mg/dL (ref 8.9–10.3)
Creatinine, Ser: 1.04 mg/dL — ABNORMAL HIGH (ref 0.44–1.00)
GFR calc non Af Amer: 56 mL/min — ABNORMAL LOW (ref >60)
Glucose, Bld: 102 mg/dL — ABNORMAL HIGH (ref 65–99)
POTASSIUM: 3.7 mmol/L (ref 3.5–5.1)
SODIUM: 142 mmol/L (ref 135–145)
Total Bilirubin: 0.4 mg/dL (ref 0.3–1.2)
Total Protein: 6.5 g/dL (ref 6.5–8.1)

## 2015-10-29 NOTE — Care Management Obs Status (Signed)
MEDICARE OBSERVATION STATUS NOTIFICATION   Patient Details  Name: Julie Morrow MRN: [id number] Date of Birth: 1950-10-25   Medicare Observation Status Notification Given:  Yes    Gwenette GreetBrenda S Maudean Hoffmann, RN 10/29/2015, 8:42 AM

## 2015-10-29 NOTE — BH Assessment (Signed)
Writer informed psychiatrist (Dr. Clapacs) of consult. 

## 2015-10-29 NOTE — Progress Notes (Signed)
Pts VSS, no pain reported. Pt remains delayed and weak. Daughter at bedside, all questions and concerns addressed. No additional questions at this time. Pt encouraged to participate in ADLs and work with PT. Will continue to monitor.

## 2015-10-29 NOTE — Progress Notes (Signed)
Subjective: Patient reports that she is still weak.  Her voice is somewhat stronger.    Objective: Current vital signs: BP 102/47 mmHg  Pulse 45  Temp(Src) 97.5 F (36.4 C) (Oral)  Resp 16  Ht '5\' 3"'$  (1.6 m)  Wt 121.519 kg (267 lb 14.4 oz)  BMI 47.47 kg/m2  SpO2 96% Vital signs in last 24 hours: Temp:  [97.5 F (36.4 C)-98.9 F (37.2 C)] 97.5 F (36.4 C) (06/14 0503) Pulse Rate:  [45-50] 45 (06/14 0503) Resp:  [16-22] 16 (06/14 0503) BP: (102-118)/(47-69) 102/47 mmHg (06/14 0503) SpO2:  [96 %-97 %] 96 % (06/14 0503)  Intake/Output from previous day: 06/13 0701 - 06/14 0700 In: 360 [P.O.:360] Out: 300 [Urine:300] Intake/Output this shift: Total I/O In: 240 [P.O.:240] Out: -  Nutritional status: Diet heart healthy/carb modified Room service appropriate?: Yes; Fluid consistency:: Thin  Neurologic Exam: Mental Status: Alert, oriented, thought content appropriate. Speech fluent without evidence of aphasia. Able to follow 3 step commands without difficulty. Cranial Nerves: II: Discs flat bilaterally; Visual fields grossly normal, pupils equal, round, reactive to light and accommodation III,IV, VI: ptosis not present, extra-ocular motions intact bilaterally V,VII: smile symmetric, facial light touch sensation normal bilaterally VIII: hearing normal bilaterally IX,X: gag reflex present XI: bilateral shoulder shrug XII: midline tongue extension Motor: Right :Upper extremity 5/5Left: Upper extremity 5/5 Lower extremity 5/5Lower extremity 5/5 Tone and bulk:normal tone throughout; no atrophy noted Sensory: Pinprick and light touch intact throughout, bilaterally  Lab Results: Basic Metabolic Panel:  Recent Labs Lab 10/27/15 1032 10/28/15 0449 10/29/15 0458  NA 141 143 142  K 3.7 3.4* 3.7  CL 107 109 108  CO2 '23 26 28  '$ GLUCOSE 159* 110* 102*  BUN '9 10  11  '$ CREATININE 1.13* 1.02* 1.04*  CALCIUM 9.4 9.2 9.3    Liver Function Tests:  Recent Labs Lab 10/29/15 0458  AST 19  ALT 15  ALKPHOS 80  BILITOT 0.4  PROT 6.5  ALBUMIN 3.8   No results for input(s): LIPASE, AMYLASE in the last 168 hours. No results for input(s): AMMONIA in the last 168 hours.  CBC:  Recent Labs Lab 10/27/15 1032 10/28/15 0449  WBC 11.7* 9.3  HGB 14.3 14.2  HCT 44.3 42.4  MCV 93.0 92.4  PLT 203 189    Cardiac Enzymes:  Recent Labs Lab 10/27/15 1032 10/28/15 0449  CKTOTAL  --  36*  TROPONINI <0.03  --     Lipid Panel: No results for input(s): CHOL, TRIG, HDL, CHOLHDL, VLDL, LDLCALC in the last 168 hours.  CBG:  Recent Labs Lab 10/28/15 1204 10/28/15 1659 10/28/15 1734 10/28/15 2131 10/29/15 0723  GLUCAP 99 54* 81 114* 106*    Microbiology: Results for orders placed or performed in visit on 06/16/11  Stool culture     Status: None   Collection Time: 06/17/11  9:12 PM  Result Value Ref Range Status   Micro Text Report   Final       COMMENT                   NO SALMONELLA OR SHIGELLA ISOLATED IN 3 DAYS   COMMENT                   NO PATHOGENIC E.COLI DETECTED   COMMENT                   NO CAMPYLOBACTER ANTIGEN DETECTED   ANTIBIOTIC  Clostridium Difficile by PCR     Status: None   Collection Time: 06/17/11  9:12 PM  Result Value Ref Range Status   Micro Text Report   Final       COMMENT                   NEGATIVE-CLOS.DIFFICILE TOXIN NOT DETECTED BY PCR   ANTIBIOTIC                                                        Coagulation Studies: No results for input(s): LABPROT, INR in the last 72 hours.  Imaging: Dg Chest 2 View  10/27/2015  CLINICAL DATA:  Weakness, shortness of breath, slurred speech, drooling today, type II diabetes mellitus, former smoker EXAM: CHEST  2 VIEW COMPARISON:  10/10/2015 FINDINGS: Enlargement of cardiac silhouette. Mediastinal contours  and pulmonary vascularity normal. Minimal RIGHT basilar atelectasis. Lungs otherwise clear. No pleural effusion or pneumothorax. Bones unremarkable. IMPRESSION: Enlargement of cardiac silhouette with minimal RIGHT basilar atelectasis. Electronically Signed   By: Lavonia Dana M.D.   On: 10/27/2015 15:17   Ct Head Wo Contrast  10/28/2015  CLINICAL DATA:  Generalized weakness for several weeks EXAM: CT HEAD WITHOUT CONTRAST TECHNIQUE: Contiguous axial images were obtained from the base of the skull through the vertex without intravenous contrast. 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 Postsurgical changes are noted in the right cerebellar hemisphere consistent with the prior surgery. No focus of increased attenuation is identified to suggest acute hemorrhage. The falx is again mildly prominent but stable from the prior exam. No other findings to suggest acute hemorrhage are noted. No findings of acute infarct or other space-occupying mass lesion are seen. IMPRESSION: Postsurgical changes are noted.  No definitive hemorrhage is seen. Electronically Signed   By: Inez Catalina M.D.   On: 10/28/2015 09:21    Medications:  I have reviewed the patient's current medications. Scheduled: . ARIPiprazole  30 mg Oral QHS  . aspirin  81 mg Oral Daily  . clonazePAM  1 mg Oral QHS  . heparin  5,000 Units Subcutaneous Q8H  . insulin aspart  0-9 Units Subcutaneous TID WC  . levothyroxine  225 mcg Oral QAC breakfast  . linagliptin  5 mg Oral Daily  . lithium carbonate  600 mg Oral QHS  . vitamin B-12  1,000 mcg Oral Daily    Assessment/Plan: Patient reports still being weak.  NIF -30.  Vital capacity 1.8.  CK, ESR, CRP.  Myasthenia panel pending.  Patient on B12.  Recommendations: 1.  Agree with continued therapy.  Will follow up pending lab results. 2.  Patient to have a NCV/EMG after discharge.      Alexis Goodell,  MD Neurology [phone] 10/29/2015  11:38 AM

## 2015-10-29 NOTE — Progress Notes (Signed)
[facility] - [place] at [facility]   PATIENT NAME: Julie Morrow    MR#:  [id number]  DATE OF BIRTH:  07-03-1950  SUBJECTIVE:   Patient here with weakness, feels still weak.  REVIEW OF SYSTEMS:    Review of Systems  Constitutional: Positive for malaise/fatigue. Negative for fever and chills.  HENT: Negative for ear discharge, ear pain, hearing loss, nosebleeds and sore throat.   Eyes: Negative for blurred vision and pain.  Respiratory: Negative for cough, hemoptysis, shortness of breath and wheezing.   Cardiovascular: Negative for chest pain, palpitations and leg swelling.  Gastrointestinal: Negative for nausea, vomiting, abdominal pain, diarrhea and blood in stool.  Genitourinary: Negative for dysuria.  Musculoskeletal: Negative for back pain.  Neurological: Positive for weakness. Negative for dizziness, tremors, speech change, focal weakness, seizures and headaches.  Endo/Heme/Allergies: Does not bruise/bleed easily.  Psychiatric/Behavioral: Negative for depression, suicidal ideas and hallucinations.    Tolerating Diet: yes   DRUG ALLERGIES:   Allergies  Allergen Reactions  . Neomycin-Bacitracin Zn-Polymyx Rash    VITALS:  Blood pressure 102/47, pulse 45, temperature 97.5 F (36.4 C), temperature source Oral, resp. rate 16, height 5\' 3"  (1.6 m), weight 121.519 kg (267 lb 14.4 oz), SpO2 96 %.  PHYSICAL EXAMINATION:   Physical Exam  Constitutional: She is oriented to person, place, and time and well-developed, well-nourished, and in no distress. No distress.  obese  HENT:  Head: Normocephalic.  Eyes: No scleral icterus.  Neck: Normal range of motion. Neck supple. No JVD present. No tracheal deviation present. Thyromegaly present.  Cardiovascular: Normal rate, regular rhythm and normal heart sounds.  Exam reveals no gallop and no friction rub.   No murmur heard. Pulmonary/Chest: Effort normal and breath sounds normal. No respiratory distress. She  has no wheezes. She has no rales. She exhibits no tenderness.  Abdominal: Soft. Bowel sounds are normal. She exhibits no distension and no mass. There is no tenderness. There is no rebound and no guarding.  Musculoskeletal: Normal range of motion. She exhibits no edema.  3/5 b/l LE  Neurological: She is alert and oriented to person, place, and time. She displays normal reflexes. A cranial nerve deficit is present. She exhibits normal muscle tone. Coordination abnormal.  Skin: Skin is warm. No rash noted. No erythema.  Psychiatric:  Flat affect     LABORATORY PANEL:   CBC  Recent Labs Lab 10/28/15 0449  WBC 9.3  HGB 14.2  HCT 42.4  PLT 189   ------------------------------------------------------------------------------------------------------------------  Chemistries   Recent Labs Lab 10/29/15 0458  NA 142  K 3.7  CL 108  CO2 28  GLUCOSE 102*  BUN 11  CREATININE 1.04*  CALCIUM 9.3  AST 19  ALT 15  ALKPHOS 80  BILITOT 0.4   ------------------------------------------------------------------------------------------------------------------  Cardiac Enzymes  Recent Labs Lab 10/27/15 1032  TROPONINI <0.03   ------------------------------------------------------------------------------------------------------------------  RADIOLOGY:  Dg Chest 2 View  10/27/2015  CLINICAL DATA:  Weakness, shortness of breath, slurred speech, drooling today, type II diabetes mellitus, former smoker EXAM: CHEST  2 VIEW COMPARISON:  10/10/2015 FINDINGS: Enlargement of cardiac silhouette. Mediastinal contours and pulmonary vascularity normal. Minimal RIGHT basilar atelectasis. Lungs otherwise clear. No pleural effusion or pneumothorax. Bones unremarkable. IMPRESSION: Enlargement of cardiac silhouette with minimal RIGHT basilar atelectasis. Electronically Signed   By: Ulyses SouthwardMark  Boles M.D.   On: 10/27/2015 15:17   Ct Head Wo Contrast  10/28/2015  CLINICAL DATA:  Generalized weakness for  several weeks EXAM: CT HEAD WITHOUT CONTRAST TECHNIQUE:  Contiguous axial images were obtained from the base of the skull through the vertex without intravenous contrast. 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 Postsurgical changes are noted in the right cerebellar hemisphere consistent with the prior surgery. No focus of increased attenuation is identified to suggest acute hemorrhage. The falx is again mildly prominent but stable from the prior exam. No other findings to suggest acute hemorrhage are noted. No findings of acute infarct or other space-occupying mass lesion are seen. IMPRESSION: Postsurgical changes are noted.  No definitive hemorrhage is seen. Electronically Signed   By: Alcide Clever M.D.   On: 10/28/2015 09:21     ASSESSMENT AND PLAN:    65 year old female with a history of hypogonadism, depression and bipolar who was recently admitted to psychiatric unit who presented with generalized weakness.  1. Generalized weakness: This is likely due to deconditioning. Significant hypothyroidism may also cause deconditioning and generalized weakness. Initial CT scan was questionable concerning for subdural hemorrhage. Repeat CT scan this morning does not show evidence of subdural hemorrhage. There is no evidence of acute stroke. Physical therapy and case management for disposition. Lithium level was within normal limits. normal CK level as patient is on fenofibrate and statin.  2. Hypothyroidism: Continue current increased dose of Synthroid with repeat thyroid function tests in 4 weeks.  3. Depression and bipolar disorder: Patient is on lithium and has diagnosis of hypothyroid. We'll have psychiatry consult on possibly changing medications.  awaited psych consult.  4. Diabetes: Continue glipizide, TRADJENTA and sliding scale insulin with ADA diet.  5. HLD check CK levels on statin Consider stopping statin due to  weakness.  6. Hypokalemia: replete PT consult for disposition. Management plans discussed with the patient and she is in agreement.  CODE STATUS: FULL  TOTAL TIME TAKING CARE OF THIS PATIENT: 30 minutes.     POSSIBLE D/C tomorrow, DEPENDING ON CLINICAL CONDITION.   Altamese Dilling M.D on 10/29/2015 at 2:54 PM  Between 7am to 6pm - Pager - [phone] After 6pm go to [email or web address] - password [email or web address]  [facility]  Office  [phone]  CC: Primary care physician; Jerl Mina, MD  Note: This dictation was prepared with Dragon dictation along with smaller phrase technology. Any transcriptional errors that result from this process are unintentional.

## 2015-10-29 NOTE — Consult Note (Signed)
[facility] Face-to-Face Psychiatry Consult   Reason for Consult:  Consult for 65 year old woman with a history of bipolar disorder and depression currently in the hospital with weakness. Referring Physician:  Boyce Medici Patient Identification: Julie Morrow MRN:  [id number] Principal Diagnosis: Bipolar disorder depressed Diagnosis:   Patient Active Problem List   Diagnosis Date Noted  . Generalized weakness [R53.1] 10/27/2015  . GERD (gastroesophageal reflux disease) [K21.9] 10/15/2015  . Bipolar 1 disorder, mixed, severe ([place]) [F31.63] 10/11/2015  . Diabetes type 2, controlled ([place]) [E11.9] 07/07/2015  . Obesity [E66.9] 07/07/2015  . Tobacco use disorder [F17.200] 07/07/2015  . Hypothyroidism [E03.9] 07/07/2015  . HLD (hyperlipidemia) [E78.5] 11/07/2014  . Psoriasis [L40.9] 11/07/2014    Total Time spent with patient: 45 minutes  Subjective:   Julie Morrow is a 65 y.o. female patient admitted with "I'm not feeling good".  HPI:  Chart reviewed. Patient interviewed. Labs and vitals reviewed. 65 year old woman with a history of chronic mental health problems in the diagnosis of bipolar disorder. She was recently discharged from the psychiatry service and fairly soon thereafter return to the medicine service with a complaint of generalized weakness. She has been admitted to the medicine service and is being worked up and treated for hypothyroidism as well as other medical problems. Patient tells me that she felt like when she left the hospital she was doing even worse than when she was admitted. She was feeling more tired and run down. She says she came into the hospital this time because she thought she had a stroke because she was having so much trouble walking. Family also says she had a major fall after going home. Patient says her mood is down and sad. Sleep is poor. Eating is okay. Denies any suicidal thoughts. Denies any hallucinations. She is feeling more disoriented and confused much of the  time. Doesn't feel like she is much different after being in the hospital a few days. When I directly asked her about whether she thought the lithium was part of the problem she said she did not.  Social history: Lives at home with family. Not able to work.  Medical history: Multiple medical problems including diabetes obesity hypothyroidism which is out of control and current respiratory distress requiring oxygen.  Substance abuse history: Denies any history of substance abuse problems.    Past Psychiatric History: Long history of mental health problems going back years. Multiple hospitalizations. Diagnosis of bipolar disorder. Has been on a wide range of antipsychotics and mood stabilizers and antidepressants. Her most recent hospitalization she was started on lithium prior to discharge. Patient says she does not feel like the lithium has been part of the problem. She had taken in the past without difficulty. Does have a past history of suicidal thought and some behavior.  Risk to Self: Is patient at risk for suicide?: No Risk to Others:   Prior Inpatient Therapy:   Prior Outpatient Therapy:    Past Medical History:  Past Medical History  Diagnosis Date  . Hypothyroidism   . Depression   . Anxiety   . Diabetes mellitus, type II ([place])     Patient takes Glucotrol and Januvia  . H/O diabetes mellitus 04/08/2015    borderline diabetes no medications needed.   . Bipolar 1 disorder [facility])     Past Surgical History  Procedure Laterality Date  . Abdominal hysterectomy     Family History:  Family History  Problem Relation Age of Onset  . Dementia Mother   .  Arthritis Father    Family Psychiatric  History: Patient says there is extensive substance abuse in her family no other known mental health problems Social History:  History  Alcohol Use No     History  Drug Use No    Social History   Social History  . Marital Status: Single    Spouse Name: N/A  . Number of Children:  N/A  . Years of Education: N/A   Social History Main Topics  . Smoking status: Former Smoker -- 0.25 packs/day for 5 years    Types: Cigarettes    Start date: 11/06/1984  . Smokeless tobacco: Never Used  . Alcohol Use: No  . Drug Use: No  . Sexual Activity: No   Other Topics Concern  . None   Social History Narrative   Additional Social History:    Allergies:   Allergies  Allergen Reactions  . Neomycin-Bacitracin Zn-Polymyx Rash    Labs:  Results for orders placed or performed during the hospital encounter of 10/27/15 (from the past 48 hour(s))  Urinalysis complete, with microscopic     Status: Abnormal   Collection Time: 10/27/15  9:58 PM  Result Value Ref Range   Color, Urine YELLOW (A) YELLOW   APPearance HAZY (A) CLEAR   Glucose, UA NEGATIVE NEGATIVE mg/dL   Bilirubin Urine NEGATIVE NEGATIVE   Ketones, ur NEGATIVE NEGATIVE mg/dL   Specific Gravity, Urine 1.006 1.005 - 1.030   Hgb urine dipstick NEGATIVE NEGATIVE   pH 6.0 5.0 - 8.0   Protein, ur NEGATIVE NEGATIVE mg/dL   Nitrite POSITIVE (A) NEGATIVE   Leukocytes, UA 2+ (A) NEGATIVE   RBC / HPF 0-5 0 - 5 RBC/hpf   WBC, UA TOO NUMEROUS TO COUNT 0 - 5 WBC/hpf   Bacteria, UA RARE (A) NONE SEEN   Squamous Epithelial / LPF 0-5 (A) NONE SEEN   Mucous PRESENT   Urine Drug Screen, Qualitative ([facility] only)     Status: None   Collection Time: 10/27/15  9:58 PM  Result Value Ref Range   Tricyclic, Ur Screen NONE DETECTED NONE DETECTED   Amphetamines, Ur Screen NONE DETECTED NONE DETECTED   MDMA (Ecstasy)Ur Screen NONE DETECTED NONE DETECTED   Cocaine Metabolite,Ur [place] NONE DETECTED NONE DETECTED   Opiate, Ur Screen NONE DETECTED NONE DETECTED   Phencyclidine (PCP) Ur S NONE DETECTED NONE DETECTED   Cannabinoid 50 Ng, Ur [place] NONE DETECTED NONE DETECTED   Barbiturates, Ur Screen NONE DETECTED NONE DETECTED   Benzodiazepine, Ur Scrn NONE DETECTED NONE DETECTED   Methadone Scn, Ur NONE DETECTED NONE DETECTED    Comment:  (NOTE) 671  Tricyclics, urine               Cutoff 1000 ng/mL 200  Amphetamines, urine             Cutoff 1000 ng/mL 300  MDMA (Ecstasy), urine           Cutoff 500 ng/mL 400  Cocaine Metabolite, urine       Cutoff 300 ng/mL 500  Opiate, urine                   Cutoff 300 ng/mL 600  Phencyclidine (PCP), urine      Cutoff 25 ng/mL 700  Cannabinoid, urine              Cutoff 50 ng/mL 800  Barbiturates, urine             Cutoff 200 ng/mL 900  Benzodiazepine, urine           Cutoff 200 ng/mL 1000 Methadone, urine                Cutoff 300 ng/mL 1100 1200 The urine drug screen provides only a preliminary, unconfirmed 1300 analytical test result and should not be used for non-medical 1400 purposes. Clinical consideration and professional judgment should 1500 be applied to any positive drug screen result due to possible 1600 interfering substances. A more specific alternate chemical method 1700 must be used in order to obtain a confirmed analytical result.  1800 Gas chromato graphy / mass spectrometry (GC/MS) is the preferred 1900 confirmatory method.   Basic metabolic panel     Status: Abnormal   Collection Time: 10/28/15  4:49 AM  Result Value Ref Range   Sodium 143 135 - 145 mmol/L   Potassium 3.4 (L) 3.5 - 5.1 mmol/L   Chloride 109 101 - 111 mmol/L   CO2 26 22 - 32 mmol/L   Glucose, Bld 110 (H) 65 - 99 mg/dL   BUN 10 6 - 20 mg/dL   Creatinine, Ser 1.02 (H) 0.44 - 1.00 mg/dL   Calcium 9.2 8.9 - 10.3 mg/dL   GFR calc non Af Amer 57 (L) >60 mL/min   GFR calc Af Amer >60 >60 mL/min    Comment: (NOTE) The eGFR has been calculated using the CKD EPI equation. This calculation has not been validated in all clinical situations. eGFR's persistently <60 mL/min signify possible Chronic Kidney Disease.    Anion gap 8 5 - 15  CBC     Status: Abnormal   Collection Time: 10/28/15  4:49 AM  Result Value Ref Range   WBC 9.3 3.6 - 11.0 K/uL   RBC 4.59 3.80 - 5.20 MIL/uL   Hemoglobin 14.2  12.0 - 16.0 g/dL   HCT 42.4 35.0 - 47.0 %   MCV 92.4 80.0 - 100.0 fL   MCH 30.9 26.0 - 34.0 pg   MCHC 33.5 32.0 - 36.0 g/dL   RDW 16.7 (H) 11.5 - 14.5 %   Platelets 189 150 - 440 K/uL  CK     Status: Abnormal   Collection Time: 10/28/15  4:49 AM  Result Value Ref Range   Total CK 36 (L) 38 - 234 U/L  Glucose, capillary     Status: Abnormal   Collection Time: 10/28/15  8:02 AM  Result Value Ref Range   Glucose-Capillary 135 (H) 65 - 99 mg/dL  Glucose, capillary     Status: None   Collection Time: 10/28/15 12:04 PM  Result Value Ref Range   Glucose-Capillary 99 65 - 99 mg/dL  Sedimentation rate     Status: None   Collection Time: 10/28/15  2:51 PM  Result Value Ref Range   Sed Rate 27 0 - 30 mm/hr  C-reactive protein     Status: None   Collection Time: 10/28/15  2:51 PM  Result Value Ref Range   CRP 0.8 <1.0 mg/dL    Comment: Performed at [facility]  Glucose, capillary     Status: Abnormal   Collection Time: 10/28/15  4:59 PM  Result Value Ref Range   Glucose-Capillary 54 (L) 65 - 99 mg/dL  Glucose, capillary     Status: None   Collection Time: 10/28/15  5:34 PM  Result Value Ref Range   Glucose-Capillary 81 65 - 99 mg/dL  Glucose, capillary     Status: Abnormal   Collection Time: 10/28/15  9:31 PM  Result Value Ref Range   Glucose-Capillary 114 (H) 65 - 99 mg/dL  Comprehensive metabolic panel     Status: Abnormal   Collection Time: 10/29/15  4:58 AM  Result Value Ref Range   Sodium 142 135 - 145 mmol/L   Potassium 3.7 3.5 - 5.1 mmol/L   Chloride 108 101 - 111 mmol/L   CO2 28 22 - 32 mmol/L   Glucose, Bld 102 (H) 65 - 99 mg/dL   BUN 11 6 - 20 mg/dL   Creatinine, Ser 1.04 (H) 0.44 - 1.00 mg/dL   Calcium 9.3 8.9 - 10.3 mg/dL   Total Protein 6.5 6.5 - 8.1 g/dL   Albumin 3.8 3.5 - 5.0 g/dL   AST 19 15 - 41 U/L   ALT 15 14 - 54 U/L   Alkaline Phosphatase 80 38 - 126 U/L   Total Bilirubin 0.4 0.3 - 1.2 mg/dL   GFR calc non Af Amer 56 (L) >60 mL/min   GFR  calc Af Amer >60 >60 mL/min    Comment: (NOTE) The eGFR has been calculated using the CKD EPI equation. This calculation has not been validated in all clinical situations. eGFR's persistently <60 mL/min signify possible Chronic Kidney Disease.    Anion gap 6 5 - 15  Glucose, capillary     Status: Abnormal   Collection Time: 10/29/15  7:23 AM  Result Value Ref Range   Glucose-Capillary 106 (H) 65 - 99 mg/dL  Glucose, capillary     Status: Abnormal   Collection Time: 10/29/15 12:18 PM  Result Value Ref Range   Glucose-Capillary 155 (H) 65 - 99 mg/dL  Glucose, capillary     Status: Abnormal   Collection Time: 10/29/15  4:21 PM  Result Value Ref Range   Glucose-Capillary 130 (H) 65 - 99 mg/dL    Current Facility-Administered Medications  Medication Dose Route Frequency Provider Last Rate Last Dose  . ARIPiprazole (ABILIFY) tablet 30 mg  30 mg Oral QHS Vaughan Basta, MD   30 mg at 10/28/15 2025  . aspirin chewable tablet 81 mg  81 mg Oral Daily Vaughan Basta, MD   81 mg at 10/29/15 1041  . clonazePAM (KLONOPIN) tablet 1 mg  1 mg Oral QHS Vaughan Basta, MD   1 mg at 10/28/15 2026  . heparin injection 5,000 Units  5,000 Units Subcutaneous Q8H Vaughan Basta, MD   5,000 Units at 10/29/15 1340  . insulin aspart (novoLOG) injection 0-9 Units  0-9 Units Subcutaneous TID WC Vaughan Basta, MD   1 Units at 10/29/15 1716  . levothyroxine (SYNTHROID, LEVOTHROID) tablet 225 mcg  225 mcg Oral QAC breakfast Vaughan Basta, MD   225 mcg at 10/29/15 1040  . linagliptin (TRADJENTA) tablet 5 mg  5 mg Oral Daily Vaughan Basta, MD   5 mg at 10/29/15 1041  . lithium carbonate (LITHOBID) CR tablet 600 mg  600 mg Oral QHS Vaughan Basta, MD   600 mg at 10/28/15 2025  . vitamin B-12 (CYANOCOBALAMIN) tablet 1,000 mcg  1,000 mcg Oral Daily Bettey Costa, MD   1,000 mcg at 10/29/15 1041    Musculoskeletal: Strength & Muscle Tone: decreased Gait &  Station: unable to stand Patient leans: N/A  Psychiatric Specialty Exam: Physical Exam  Nursing note and vitals reviewed. Constitutional: She appears well-nourished. She appears distressed.  HENT:  Head: Normocephalic and atraumatic.  Eyes: Conjunctivae are normal. Pupils are equal, round, and reactive to light.  Neck: Normal range of motion.  Cardiovascular:  Normal heart sounds.   Respiratory: She is in respiratory distress.  GI: Soft.  Musculoskeletal: Normal range of motion.  Neurological: She is alert.  Skin: Skin is warm and dry.  Psychiatric: Judgment and thought content normal. Her affect is blunt. Her speech is delayed. She is slowed. Cognition and memory are impaired. She exhibits a depressed mood.    Review of Systems  Constitutional: Negative.   HENT: Negative.   Eyes: Negative.   Respiratory: Negative.   Cardiovascular: Negative.   Gastrointestinal: Negative.   Musculoskeletal: Negative.   Skin: Negative.   Neurological: Negative.   Psychiatric/Behavioral: Positive for depression. Negative for suicidal ideas, hallucinations, memory loss and substance abuse. The patient is not nervous/anxious and does not have insomnia.     Blood pressure 116/46, pulse 46, temperature 98 F (36.7 C), temperature source Oral, resp. rate 20, height '5\' 3"'$  (1.6 m), weight 121.519 kg (267 lb 14.4 oz), SpO2 96 %.Body mass index is 47.47 kg/(m^2).  General Appearance: Disheveled  Eye Contact:  Fair  Speech:  Slow  Volume:  Decreased  Mood:  Dysphoric  Affect:  Constricted  Thought Process:  Goal Directed  Orientation:  Full (Time, Place, and Person)  Thought Content:  Logical  Suicidal Thoughts:  No  Homicidal Thoughts:  No  Memory:  Immediate;   Fair Recent;   Fair Remote;   Fair  Judgement:  Fair  Insight:  Fair  Psychomotor Activity:  Decreased  Concentration:  Concentration: Fair  Recall:  [employer] of Knowledge:  Fair  Language:  Fair  Akathisia:  No  Handed:  Right   AIMS (if indicated):     Assets:  Desire for Improvement Housing Resilience  ADL's:  Impaired  Cognition:  Impaired,  Mild  Sleep:        Treatment Plan Summary: Plan 65 year old woman with bipolar disorder and multiple other medical problems. Currently complaining of feeling weak and run down. Medicines that she's taking psychiatrically are Abilify and lithium primarily. She had been on Abilify for a long time and tolerating it well. Past history of lithium tolerance as well. She had been very fatigued and run down before coming in to the psychiatry ward and it's not at all clear to me that the lithium could be directly responsible. Her lithium level was actually normal now. For today I'm going to withhold making any changes to her medicine. I will continue to follow-up and we will see if we can come up with anything psychiatrically that would help with her mood and her general energy level. Patient understands and agrees to plan.  Disposition: Patient does not meet criteria for psychiatric inpatient admission. Supportive therapy provided about ongoing stressors.  Alethia Berthold, MD 10/29/2015 6:19 PM

## 2015-10-29 NOTE — Progress Notes (Signed)
Physical Therapy Treatment Patient Details Name: [name] MRN: [id number] DOB: Jun 20, 1950 Today's Date: 10/29/2015    History of Present Illness Julie Morrow  is a 65 y.o. female with a known history of Hypothyroidism, depression, anxiety, diabetes, bipolar disorder- was recently admitted to psychiatric the inpatient unit and she stayed there for 10 days and discharged home to her daughter 5 days ago. As per daughter there was some medication changes made during that admission but patient gradually got worse physically in that admission and when she went home she was discharged home with a walker. Before shewas admitted last time to psychiatric unit she was able to walk on her own at home. As per daughter patient continued to get worse after she went home for last 5 days in spite of taking all her medications on time as prescribed, so she was concerned and brought the patient to emergency room back again today. As per daughter patient is also somewhat slow mentally and she has decreased appetite and slow thinking. Sometimes she is slightly confused also. She denies any urinary symptoms or fever or chills. Her TSH level was very high 2 month ago and during last admission in hospital, and as per the psychiatric note her thyroxine dose was changed by endocrinologist 2-3 months ago, and psychiatric is continue the same dose during last admission and discharge. TSH is found to be high again, but less than the previous report and urinalysis is still not collected. Because of patient's generalized weakness and decreased functional status at home and daughter strongly feels she may need some placement for more help at home and so ER physician suggested to admit to medical services for these issues. Pt reports 1 fall in the last 12 months which occurred approximately 1 week ago. She denies any injury with the fall.     PT Comments    Pt requiring assist with bed mobility, transfers, and ambulation due to  weakness. She is significantly deconditioned requiring multiple seated rest breaks during ambulation. Pt with fair static standing balance with CGA only mostly however does have one bout when transferring from [facility] where she requires therapist assist to prevent LOB. Pt able to follow all commands for mobility and becomes less lethargic as session progresses. Pt will need SNF placement at discharge as she is currently unsafe to return home. Returning home in her current state would likely result in readmission from further immobility. Pt will benefit from skilled PT services to address deficits in strength, balance, and mobility in order to return to full function at home.   Follow Up Recommendations  SNF     Equipment Recommendations  None recommended by PT    Recommendations for Other Services       Precautions / Restrictions Precautions Precautions: Fall Restrictions Weight Bearing Restrictions: No    Mobility  Bed Mobility Overal bed mobility: Needs Assistance Bed Mobility: Supine to Sit     Supine to sit: Mod assist     General bed mobility comments: Pt requiring modA+1 for supine to sit. She moves very slow requiring hand over hand assistance for proper sequencing.  Transfers Overall transfer level: Needs assistance Equipment used: Rolling walker (2 wheeled) Transfers: Sit to/from Stand Sit to Stand: Min assist (Progressing to min guard)         General transfer comment: Pt takes extended time to come to standing. Requires heavy verbal and tactile cues for proper sequencing with transfer. Decreased LE strength and power noted during transfer  Ambulation/Gait Ambulation/Gait assistance: Min assist;+2 safety/equipment Ambulation Distance (Feet): 90 Feet (30+30+30) Assistive device: Rolling walker (2 wheeled) Gait Pattern/deviations: Decreased step length - right;Decreased step length - left;Shuffle Gait velocity: Decreased Gait velocity interpretation: <1.8 ft/sec,  indicative of risk for recurrent falls General Gait Details: Pt ambulates with short shuffling steps. At times her upper body outpaces her legs and she has to be cued to slow down. Cues provided to stay within confines of walker. HR increases from approximately 50 at rest to 70 during ambulation. Pt denies DOE but reports fatigue. SaO2 improves from 92% at rest on room air to 95% on room air with exertion. Chair follow in order to allow seated rest breaks   Stairs            Wheelchair Mobility    Modified Rankin (Stroke Patients Only)       Balance Overall balance assessment: Needs assistance Sitting-balance support: No upper extremity supported Sitting balance-Leahy Scale: Good     Standing balance support: Bilateral upper extremity supported Standing balance-Leahy Scale: Fair                      Cognition Arousal/Alertness: Awake/alert Behavior During Therapy: Flat affect Overall Cognitive Status: Impaired/Different from baseline Area of Impairment: Orientation;Attention Orientation Level: Disoriented to;Time;Situation (Pt unable to accurately provide DOB. extended time for place) Current Attention Level: Alternating Memory: Decreased short-term memory         General Comments: Pt is slow to respond. Very flat affect    Exercises      General Comments        Pertinent Vitals/Pain Pain Assessment: No/denies pain    Home Living Family/patient expects to be discharged to:: Private residence Living Arrangements: Children;Other (Comment) (Currently living in daughter's apartment) Available Help at Discharge: Family;Other (Comment) (Daughter, father currently unwell and unable to help) Type of Home: House Home Access: Stairs to enter Entrance Stairs-Rails: Can reach both Home Layout: One level Home Equipment: Environmental consultantWalker - 2 wheels;Cane - single point;Shower seat      Prior Function Level of Independence: Needs assistance  Gait / Transfers Assistance  Needed: Ambulating limited community distances with single point cane ADL's / Homemaking Assistance Needed: Daughter assisting with ADLs/IADLs     PT Goals (current goals can now be found in the care plan section) Acute Rehab PT Goals Patient Stated Goal: Improve strength and function at home PT Goal Formulation: With patient Time For Goal Achievement: 11/12/15 Potential to Achieve Goals: Good    Frequency  Min 2X/week    PT Plan      Co-evaluation             End of Session Equipment Utilized During Treatment: Gait belt Activity Tolerance: Patient limited by fatigue Patient left: in chair;with call bell/phone within reach;with chair alarm set;with family/visitor present     Time: [id number] PT Time Calculation (min) (ACUTE ONLY): 47 min  Charges:  $Gait Training: 8-22 mins                    G Codes:  Functional Assessment Tool Used: clinical judgement, 6557m gait speed Functional Limitation: Mobility: Walking and moving around Mobility: Walking and Moving Around Current Status [phone]([id number]): At least 60 percent but less than 80 percent impaired, limited or restricted Mobility: Walking and Moving Around Goal Status [phone](G8979): At least 20 percent but less than 40 percent impaired, limited or restricted   Lynnea MaizesJason D Huprich PT, DPT   Huprich,Jason 10/29/2015, 5:07  PM   

## 2015-10-29 NOTE — Care Management (Signed)
Admitted to [facility] under observation status with the diagnosis of generalized weakness. Discharged from Behavioral Medicine 10/11/15. Lives with father Annette StableBill [phone]). Daughter is Archie Pattenonya [phone]). Last seen Dr. Tomi BambergerSusan Fuller a few months ago. Also, sees Dr. Burnett ShengHedrick. No Home Health. No skilled facility. No home oxygen. Takes care of all basic activities of daily living herself, doesn't drive anymore. Larey SeatFell about a week ago. Decreased appetite x 4-5 weeks. [facility] [place]. Daughter will transport. Gwenette GreetBrenda S Makylee Sanborn RN MSN CCM Care management [phone]

## 2015-10-30 DIAGNOSIS — R531 Weakness: Secondary | ICD-10-CM | POA: Diagnosis not present

## 2015-10-30 DIAGNOSIS — F3163 Bipolar disorder, current episode mixed, severe, without psychotic features: Secondary | ICD-10-CM | POA: Diagnosis not present

## 2015-10-30 LAB — ANTI-SMOOTH MUSCLE ANTIBODY, IGG: F-Actin IgG: 13 Units (ref 0–19)

## 2015-10-30 LAB — GLUCOSE, CAPILLARY
GLUCOSE-CAPILLARY: 134 mg/dL — AB (ref 65–99)
Glucose-Capillary: 122 mg/dL — ABNORMAL HIGH (ref 65–99)
Glucose-Capillary: 97 mg/dL (ref 65–99)
Glucose-Capillary: 146 mg/dL — ABNORMAL HIGH (ref 65–99)

## 2015-10-30 LAB — ACETYLCHOLINE RECEPTOR, BINDING: Acety choline binding ab: <0.03 nmol/L (ref 0.00–0.24)

## 2015-10-30 MED ORDER — LITHIUM CARBONATE ER 300 MG PO TBCR
300.0000 mg | EXTENDED_RELEASE_TABLET | Freq: Every day | ORAL | Status: DC
  Administered 2015-10-30: 21:00:00 300 mg via ORAL
  Filled 2015-10-30: qty 1

## 2015-10-30 NOTE — Consult Note (Signed)
[facility] Face-to-Face Psychiatry Consult   Reason for Consult:  Consult for 65 year old woman with a history of bipolar disorder and depression currently in the hospital with weakness. Referring Physician:  Boyce Medici Patient Identification: Julie Morrow MRN:  [id number] Principal Diagnosis: Bipolar disorder depressed Diagnosis:   Patient Active Problem List   Diagnosis Date Noted  . Generalized weakness [R53.1] 10/27/2015  . GERD (gastroesophageal reflux disease) [K21.9] 10/15/2015  . Bipolar 1 disorder, mixed, severe ([place]) [F31.63] 10/11/2015  . Diabetes type 2, controlled ([place]) [E11.9] 07/07/2015  . Obesity [E66.9] 07/07/2015  . Tobacco use disorder [F17.200] 07/07/2015  . Hypothyroidism [E03.9] 07/07/2015  . HLD (hyperlipidemia) [E78.5] 11/07/2014  . Psoriasis [L40.9] 11/07/2014    Total Time spent with patient: 30 minutes  Subjective:   Julie Morrow is a 65 y.o. female patient admitted with "I'm not feeling good".   Follow-up for Thursday the 15th. Patient seen area and notes reviewed. Patient continues to complain of weakness. Says she feels just as bad as yesterday. Mood is a little bit down. Denies suicidal ideation. Although the patient does have multiple reasons to be fatigued I am concerned that the lithium level although normal may be a little too much for her. That would be the most likely candidate for a sedating medicine at this point.  HPI:  Chart reviewed. Patient interviewed. Labs and vitals reviewed. 65 year old woman with a history of chronic mental health problems in the diagnosis of bipolar disorder. She was recently discharged from the psychiatry service and fairly soon thereafter return to the medicine service with a complaint of generalized weakness. She has been admitted to the medicine service and is being worked up and treated for hypothyroidism as well as other medical problems. Patient tells me that she felt like when she left the hospital she was doing even  worse than when she was admitted. She was feeling more tired and run down. She says she came into the hospital this time because she thought she had a stroke because she was having so much trouble walking. Family also says she had a major fall after going home. Patient says her mood is down and sad. Sleep is poor. Eating is okay. Denies any suicidal thoughts. Denies any hallucinations. She is feeling more disoriented and confused much of the time. Doesn't feel like she is much different after being in the hospital a few days. When I directly asked her about whether she thought the lithium was part of the problem she said she did not.  Social history: Lives at home with family. Not able to work.  Medical history: Multiple medical problems including diabetes obesity hypothyroidism which is out of control and current respiratory distress requiring oxygen.  Substance abuse history: Denies any history of substance abuse problems.    Past Psychiatric History: Long history of mental health problems going back years. Multiple hospitalizations. Diagnosis of bipolar disorder. Has been on a wide range of antipsychotics and mood stabilizers and antidepressants. Her most recent hospitalization she was started on lithium prior to discharge. Patient says she does not feel like the lithium has been part of the problem. She had taken in the past without difficulty. Does have a past history of suicidal thought and some behavior.  Risk to Self: Is patient at risk for suicide?: No Risk to Others:   Prior Inpatient Therapy:   Prior Outpatient Therapy:    Past Medical History:  Past Medical History  Diagnosis Date  . Hypothyroidism   . Depression   .  Anxiety   . Diabetes mellitus, type II ([place])     Patient takes Glucotrol and Januvia  . H/O diabetes mellitus 04/08/2015    borderline diabetes no medications needed.   . Bipolar 1 disorder [facility])     Past Surgical History  Procedure Laterality Date  . Abdominal  hysterectomy     Family History:  Family History  Problem Relation Age of Onset  . Dementia Mother   . Arthritis Father    Family Psychiatric  History: Patient says there is extensive substance abuse in her family no other known mental health problems Social History:  History  Alcohol Use No     History  Drug Use No    Social History   Social History  . Marital Status: Single    Spouse Name: N/A  . Number of Children: N/A  . Years of Education: N/A   Social History Main Topics  . Smoking status: Former Smoker -- 0.25 packs/day for 5 years    Types: Cigarettes    Start date: 11/06/1984  . Smokeless tobacco: Never Used  . Alcohol Use: No  . Drug Use: No  . Sexual Activity: No   Other Topics Concern  . None   Social History Narrative   Additional Social History:    Allergies:   Allergies  Allergen Reactions  . Neomycin-Bacitracin Zn-Polymyx Rash    Labs:  Results for orders placed or performed during the hospital encounter of 10/27/15 (from the past 48 hour(s))  Glucose, capillary     Status: Abnormal   Collection Time: 10/28/15  9:31 PM  Result Value Ref Range   Glucose-Capillary 114 (H) 65 - 99 mg/dL  Comprehensive metabolic panel     Status: Abnormal   Collection Time: 10/29/15  4:58 AM  Result Value Ref Range   Sodium 142 135 - 145 mmol/L   Potassium 3.7 3.5 - 5.1 mmol/L   Chloride 108 101 - 111 mmol/L   CO2 28 22 - 32 mmol/L   Glucose, Bld 102 (H) 65 - 99 mg/dL   BUN 11 6 - 20 mg/dL   Creatinine, Ser 1.04 (H) 0.44 - 1.00 mg/dL   Calcium 9.3 8.9 - 10.3 mg/dL   Total Protein 6.5 6.5 - 8.1 g/dL   Albumin 3.8 3.5 - 5.0 g/dL   AST 19 15 - 41 U/L   ALT 15 14 - 54 U/L   Alkaline Phosphatase 80 38 - 126 U/L   Total Bilirubin 0.4 0.3 - 1.2 mg/dL   GFR calc non Af Amer 56 (L) >60 mL/min   GFR calc Af Amer >60 >60 mL/min    Comment: (NOTE) The eGFR has been calculated using the CKD EPI equation. This calculation has not been validated in all clinical  situations. eGFR's persistently <60 mL/min signify possible Chronic Kidney Disease.    Anion gap 6 5 - 15  Glucose, capillary     Status: Abnormal   Collection Time: 10/29/15  7:23 AM  Result Value Ref Range   Glucose-Capillary 106 (H) 65 - 99 mg/dL  Glucose, capillary     Status: Abnormal   Collection Time: 10/29/15 12:18 PM  Result Value Ref Range   Glucose-Capillary 155 (H) 65 - 99 mg/dL  Glucose, capillary     Status: Abnormal   Collection Time: 10/29/15  4:21 PM  Result Value Ref Range   Glucose-Capillary 130 (H) 65 - 99 mg/dL  Glucose, capillary     Status: Abnormal   Collection Time: 10/29/15  9:44 PM  Result Value Ref Range   Glucose-Capillary 105 (H) 65 - 99 mg/dL  Glucose, capillary     Status: Abnormal   Collection Time: 10/30/15  7:32 AM  Result Value Ref Range   Glucose-Capillary 122 (H) 65 - 99 mg/dL  Glucose, capillary     Status: Abnormal   Collection Time: 10/30/15 11:32 AM  Result Value Ref Range   Glucose-Capillary 134 (H) 65 - 99 mg/dL  Glucose, capillary     Status: None   Collection Time: 10/30/15  4:37 PM  Result Value Ref Range   Glucose-Capillary 97 65 - 99 mg/dL    Current Facility-Administered Medications  Medication Dose Route Frequency Provider Last Rate Last Dose  . ARIPiprazole (ABILIFY) tablet 30 mg  30 mg Oral QHS Vaughan Basta, MD   30 mg at 10/29/15 2052  . aspirin chewable tablet 81 mg  81 mg Oral Daily Vaughan Basta, MD   81 mg at 10/30/15 0918  . clonazePAM (KLONOPIN) tablet 1 mg  1 mg Oral QHS Vaughan Basta, MD   1 mg at 10/29/15 2051  . heparin injection 5,000 Units  5,000 Units Subcutaneous Q8H Vaughan Basta, MD   5,000 Units at 10/30/15 1344  . insulin aspart (novoLOG) injection 0-9 Units  0-9 Units Subcutaneous TID WC Vaughan Basta, MD   1 Units at 10/30/15 1227  . levothyroxine (SYNTHROID, LEVOTHROID) tablet 225 mcg  225 mcg Oral QAC breakfast Vaughan Basta, MD   225 mcg at 10/30/15  0918  . linagliptin (TRADJENTA) tablet 5 mg  5 mg Oral Daily Vaughan Basta, MD   5 mg at 10/30/15 0918  . lithium carbonate (LITHOBID) CR tablet 300 mg  300 mg Oral QHS Gonzella Lex, MD      . vitamin B-12 (CYANOCOBALAMIN) tablet 1,000 mcg  1,000 mcg Oral Daily Bettey Costa, MD   1,000 mcg at 10/30/15 0258    Musculoskeletal: Strength & Muscle Tone: decreased Gait & Station: unable to stand Patient leans: N/A  Psychiatric Specialty Exam: Physical Exam  Nursing note and vitals reviewed. Constitutional: She appears well-nourished. She appears distressed.  HENT:  Head: Normocephalic and atraumatic.  Eyes: Conjunctivae are normal. Pupils are equal, round, and reactive to light.  Neck: Normal range of motion.  Cardiovascular: Normal heart sounds.   Respiratory: She is in respiratory distress.  GI: Soft.  Musculoskeletal: Normal range of motion.  Neurological: She is alert.  Skin: Skin is warm and dry.  Psychiatric: Judgment and thought content normal. Her affect is blunt. Her speech is delayed. She is slowed. Cognition and memory are impaired. She exhibits a depressed mood.    Review of Systems  Constitutional: Negative.   HENT: Negative.   Eyes: Negative.   Respiratory: Negative.   Cardiovascular: Negative.   Gastrointestinal: Negative.   Musculoskeletal: Negative.   Skin: Negative.   Neurological: Negative.   Psychiatric/Behavioral: Positive for depression. Negative for suicidal ideas, hallucinations, memory loss and substance abuse. The patient is not nervous/anxious and does not have insomnia.     Blood pressure 102/52, pulse 50, temperature 97.6 F (36.4 C), temperature source Oral, resp. rate 18, height _0  (1.6 m), weight 121.519 kg (267 lb 14.4 oz), SpO2 100 %.Body mass index is 47.47 kg/(m^2).  General Appearance: Disheveled  Eye Contact:  Fair  Speech:  Slow  Volume:  Decreased  Mood:  Dysphoric  Affect:  Constricted  Thought Process:  Goal Directed   Orientation:  Full (Time, Place, and Person)  Thought  Content:  Logical  Suicidal Thoughts:  No  Homicidal Thoughts:  No  Memory:  Immediate;   Fair Recent;   Fair Remote;   Fair  Judgement:  Fair  Insight:  Fair  Psychomotor Activity:  Decreased  Concentration:  Concentration: Fair  Recall:  [employer] of Knowledge:  Fair  Language:  Fair  Akathisia:  No  Handed:  Right  AIMS (if indicated):     Assets:  Desire for Improvement Housing Resilience  ADL's:  Impaired  Cognition:  Impaired,  Mild  Sleep:        Treatment Plan Summary: Plan Patient continues to complain of weakness and looks very tired and weak. Speech is a little bit slurred. Thinking appears to be slow. More I think about it the more I feel like the lithium may be contributing to some of this. I am going to cut the lithium dose back down to 300 mg at night. Might want to taper it down even further after that.  Disposition: Patient does not meet criteria for psychiatric inpatient admission. Supportive therapy provided about ongoing stressors.  Alethia Berthold, MD 10/30/2015 6:32 PM

## 2015-10-30 NOTE — Progress Notes (Signed)
[facility] [place] [facility]   PATIENT NAME: Julie Morrow    MR#:  [id number]  DATE OF BIRTH:  01-11-1951  SUBJECTIVE:  Her mental status seems close to her baseline, she is awake, but falling back to sleep while talking, very easily.  Very weak REVIEW OF SYSTEMS:    Review of Systems  Constitutional: Positive for malaise/fatigue. Negative for fever and chills.  HENT: Negative for ear discharge, ear pain, hearing loss, nosebleeds and sore throat.   Eyes: Negative for blurred vision and pain.  Respiratory: Negative for cough, hemoptysis, shortness of breath and wheezing.   Cardiovascular: Negative for chest pain, palpitations and leg swelling.  Gastrointestinal: Negative for nausea, vomiting, abdominal pain, diarrhea and blood in stool.  Genitourinary: Negative for dysuria.  Musculoskeletal: Negative for back pain.  Neurological: Positive for weakness. Negative for dizziness, tremors, speech change, focal weakness, seizures and headaches.  Endo/Heme/Allergies: Does not bruise/bleed easily.  Psychiatric/Behavioral: Positive for depression. Negative for suicidal ideas and hallucinations.    Tolerating Diet: yes DRUG ALLERGIES:   Allergies  Allergen Reactions  . Neomycin-Bacitracin Zn-Polymyx Rash   VITALS:  Blood pressure 121/46, pulse 63, temperature 97.8 F (36.6 C), temperature source Oral, resp. rate 18, height 5\' 3"  (1.6 m), weight 121.519 kg (267 lb 14.4 oz), SpO2 98 %. PHYSICAL EXAMINATION:   Physical Exam  Constitutional: She is oriented to person, place, and time and well-developed, well-nourished, and in no distress. No distress.  obese  HENT:  Head: Normocephalic.  Eyes: No scleral icterus.  Neck: Normal range of motion. Neck supple. No JVD present. No tracheal deviation present. No thyromegaly present.  Cardiovascular: Normal rate, regular rhythm and normal heart sounds.  Exam reveals no gallop and no friction rub.   No murmur  heard. Pulmonary/Chest: Effort normal and breath sounds normal. No respiratory distress. She has no wheezes. She has no rales. She exhibits no tenderness.  Abdominal: Soft. Bowel sounds are normal. She exhibits no distension and no mass. There is no tenderness. There is no rebound and no guarding.  Musculoskeletal: Normal range of motion. She exhibits no edema.  3/5 b/l LE  Neurological: She is alert and oriented to person, place, and time. She displays normal reflexes. No cranial nerve deficit. She exhibits normal muscle tone. Coordination normal.  Skin: Skin is warm. No rash noted. No erythema.  Psychiatric:  Flat affect   LABORATORY PANEL:   CBC  Recent Labs Lab 10/28/15 0449  WBC 9.3  HGB 14.2  HCT 42.4  PLT 189   ------------------------------------------------------------------------------------------------------------------  Chemistries   Recent Labs Lab 10/29/15 0458  NA 142  K 3.7  CL 108  CO2 28  GLUCOSE 102*  BUN 11  CREATININE 1.04*  CALCIUM 9.3  AST 19  ALT 15  ALKPHOS 80  BILITOT 0.4    ASSESSMENT AND PLAN:  65 year old female with a history of hypogonadism, depression and bipolar who was recently admitted to psychiatric unit who presented with generalized weakness.  1. Generalized weakness: This is likely due to deconditioning. Initial CT scan was questionable concerning for subdural hemorrhage. Repeat CT scan this morning does not show evidence of subdural hemorrhage. There is no evidence of acute stroke. Physical therapy Recommends short-term rehabilitation - care management aware and is looking into it Lithium level was within normal limits. normal CK level as patient is on fenofibrate and statin.  2. Hypothyroidism: Continue current increased dose of Synthroid with repeat thyroid function tests in 4 weeks.  3. Depression  and bipolar disorder: Patient is on lithium and has diagnosis of hypothyroid. Appreciate psych consult. He did not  recommend any changes in the medications yet  4. Diabetes: Continue glipizide, TRADJENTA and sliding scale insulin with ADA diet.  5. HLD stopping statin due to weakness.  6. Hypokalemia: repleted and resolved  PT recommends STR/SNF  Management plans discussed with the patient and she is in agreement.  CODE STATUS: FULL  TOTAL TIME TAKING CARE OF THIS PATIENT: 30 minutes.     POSSIBLE D/C tomorrow, DEPENDING ON CLINICAL CONDITION. And placement   [facility], Mujahid Jalomo M.D on 10/30/2015 at 1:02 PM  Between 7am to 6pm - Pager - [phone] After 6pm go to [email or web address] - password [email or web address]  [facility]  Office  [phone]  CC: Primary care physician; Jerl Mina, MD  Note: This dictation was prepared with Dragon dictation along with smaller phrase technology. Any transcriptional errors that result from this process are unintentional.

## 2015-10-31 DIAGNOSIS — R531 Weakness: Secondary | ICD-10-CM

## 2015-10-31 LAB — GLUCOSE, CAPILLARY
GLUCOSE-CAPILLARY: 85 mg/dL (ref 65–99)
GLUCOSE-CAPILLARY: 147 mg/dL — AB (ref 65–99)
GLUCOSE-CAPILLARY: 219 mg/dL — AB (ref 65–99)

## 2015-10-31 MED ORDER — ALBUTEROL SULFATE (2.5 MG/3ML) 0.083% IN NEBU: 2.5000 mg | INHALATION_SOLUTION | RESPIRATORY_TRACT | Status: DC | PRN

## 2015-10-31 MED ORDER — CYANOCOBALAMIN 1000 MCG PO TABS: 1000.0000 ug | ORAL_TABLET | Freq: Every day | ORAL | Status: DC

## 2015-10-31 MED ORDER — FENOFIBRATE 160 MG PO TABS: 160.0000 mg | ORAL_TABLET | Freq: Every day | ORAL | Status: DC

## 2015-10-31 MED ORDER — LEVOTHYROXINE SODIUM 75 MCG PO TABS: 225.0000 ug | ORAL_TABLET | Freq: Every day | ORAL | Status: DC

## 2015-10-31 MED ORDER — CLONAZEPAM 1 MG PO TABS: 1.0000 mg | ORAL_TABLET | Freq: Every day | ORAL | Status: DC

## 2015-10-31 NOTE — Consult Note (Signed)
Julie Morrow Face-to-Face Psychiatry Consult   Reason for Consult:  Consult for 65 year old woman with a history of bipolar disorder and depression currently in the hospital with weakness. Referring Physician:  Desiree Hane Patient Identification: Julie Morrow MRN:  [id number] Principal Diagnosis: Bipolar disorder depressed Diagnosis:   Patient Active Problem List   Diagnosis Date Noted  . Generalized weakness [R53.1] 10/27/2015  . GERD (gastroesophageal reflux disease) [K21.9] 10/15/2015  . Bipolar 1 disorder, mixed, severe (HCC) [F31.63] 10/11/2015  . Diabetes type 2, controlled (HCC) [E11.9] 07/07/2015  . Obesity [E66.9] 07/07/2015  . Tobacco use disorder [F17.200] 07/07/2015  . Hypothyroidism [E03.9] 07/07/2015  . HLD (hyperlipidemia) [E78.5] 11/07/2014  . Psoriasis [L40.9] 11/07/2014    Total Time spent with patient: 30 minutes  Subjective:   Julie Morrow is a 65 y.o. female patient admitted with "I'm not feeling good".   Follow-up for Friday the 16th. Patient still feeling sick. A little bit confused. Does not feel depressed. Denies suicidal thoughts. Still unsteady and run down and nervous. No suicidal ideation.  HPI:  Chart reviewed. Patient interviewed. Labs and vitals reviewed. 65 year old woman with a history of chronic mental health problems in the diagnosis of bipolar disorder. She was recently discharged from the psychiatry service and fairly soon thereafter return to the medicine service with a complaint of generalized weakness. She has been admitted to the medicine service and is being worked up and treated for hypothyroidism as well as other medical problems. Patient tells me that she felt like when she left the hospital she was doing even worse than when she was admitted. She was feeling more tired and run down. She says she came into the hospital this time because she thought she had a stroke because she was having so much trouble walking. Family also says she had a major fall  after going home. Patient says her mood is down and sad. Sleep is poor. Eating is okay. Denies any suicidal thoughts. Denies any hallucinations. She is feeling more disoriented and confused much of the time. Doesn't feel like she is much different after being in the hospital a few days. When I directly asked her about whether she thought the lithium was part of the problem she said she did not.  Social history: Lives at home with family. Not able to work.  Medical history: Multiple medical problems including diabetes obesity hypothyroidism which is out of control and current respiratory distress requiring oxygen.  Substance abuse history: Denies any history of substance abuse problems.    Past Psychiatric History: Long history of mental health problems going back years. Multiple hospitalizations. Diagnosis of bipolar disorder. Has been on a wide range of antipsychotics and mood stabilizers and antidepressants. Her most recent hospitalization she was started on lithium prior to discharge. Patient says she does not feel like the lithium has been part of the problem. She had taken in the past without difficulty. Does have a past history of suicidal thought and some behavior.  Risk to Self: Is patient at risk for suicide?: No Risk to Others:   Prior Inpatient Therapy:   Prior Outpatient Therapy:    Past Medical History:  Past Medical History  Diagnosis Date  . Hypothyroidism   . Depression   . Anxiety   . Diabetes mellitus, type II (HCC)     Patient takes Glucotrol and Januvia  . H/O diabetes mellitus 04/08/2015    borderline diabetes no medications needed.   . Bipolar 1 disorder (HCC)  Past Surgical History  Procedure Laterality Date  . Abdominal hysterectomy     Family History:  Family History  Problem Relation Age of Onset  . Dementia Mother   . Arthritis Father    Family Psychiatric  History: Patient says there is extensive substance abuse in her family no other known mental  health problems Social History:  History  Alcohol Use No     History  Drug Use No    Social History   Social History  . Marital Status: Single    Spouse Name: N/A  . Number of Children: N/A  . Years of Education: N/A   Social History Main Topics  . Smoking status: Former Smoker -- 0.25 packs/day for 5 years    Types: Cigarettes    Start date: 11/06/1984  . Smokeless tobacco: Never Used  . Alcohol Use: No  . Drug Use: No  . Sexual Activity: No   Other Topics Concern  . None   Social History Narrative   Additional Social History:    Allergies:   Allergies  Allergen Reactions  . Neomycin-Bacitracin Zn-Polymyx Rash    Labs:  Results for orders placed or performed during the hospital encounter of 10/27/15 (from the past 48 hour(s))  Glucose, capillary     Status: Abnormal   Collection Time: 10/29/15  9:44 PM  Result Value Ref Range   Glucose-Capillary 105 (H) 65 - 99 mg/dL  Glucose, capillary     Status: Abnormal   Collection Time: 10/30/15  7:32 AM  Result Value Ref Range   Glucose-Capillary 122 (H) 65 - 99 mg/dL  Glucose, capillary     Status: Abnormal   Collection Time: 10/30/15 11:32 AM  Result Value Ref Range   Glucose-Capillary 134 (H) 65 - 99 mg/dL  Glucose, capillary     Status: None   Collection Time: 10/30/15  4:37 PM  Result Value Ref Range   Glucose-Capillary 97 65 - 99 mg/dL  Glucose, capillary     Status: Abnormal   Collection Time: 10/30/15  9:14 PM  Result Value Ref Range   Glucose-Capillary 146 (H) 65 - 99 mg/dL  Glucose, capillary     Status: None   Collection Time: 10/31/15  7:21 AM  Result Value Ref Range   Glucose-Capillary 85 65 - 99 mg/dL  Glucose, capillary     Status: Abnormal   Collection Time: 10/31/15 11:17 AM  Result Value Ref Range   Glucose-Capillary 147 (H) 65 - 99 mg/dL  Glucose, capillary     Status: Abnormal   Collection Time: 10/31/15  4:33 PM  Result Value Ref Range   Glucose-Capillary 219 (H) 65 - 99 mg/dL     Current Facility-Administered Medications  Medication Dose Route Frequency Provider Last Rate Last Dose  . albuterol (PROVENTIL) (2.5 MG/3ML) 0.083% nebulizer solution 2.5 mg  2.5 mg Nebulization Q4H PRN Ramonita Lab, MD      . ARIPiprazole (ABILIFY) tablet 30 mg  30 mg Oral QHS Altamese Dilling, MD   30 mg at 10/30/15 2114  . aspirin chewable tablet 81 mg  81 mg Oral Daily Altamese Dilling, MD   81 mg at 10/31/15 0951  . clonazePAM (KLONOPIN) tablet 1 mg  1 mg Oral QHS Altamese Dilling, MD   1 mg at 10/30/15 2115  . [START ON 11/01/2015] fenofibrate tablet 160 mg  160 mg Oral Daily Aruna Gouru, MD      . heparin injection 5,000 Units  5,000 Units Subcutaneous Q8H Altamese Dilling, MD  5,000 Units at 10/31/15 1306  . insulin aspart (novoLOG) injection 0-9 Units  0-9 Units Subcutaneous TID WC Altamese DillingVaibhavkumar Vachhani, MD   1 Units at 10/31/15 1306  . levothyroxine (SYNTHROID, LEVOTHROID) tablet 225 mcg  225 mcg Oral QAC breakfast Altamese DillingVaibhavkumar Vachhani, MD   225 mcg at 10/31/15 0949  . linagliptin (TRADJENTA) tablet 5 mg  5 mg Oral Daily Altamese DillingVaibhavkumar Vachhani, MD   5 mg at 10/31/15 0949  . vitamin B-12 (CYANOCOBALAMIN) tablet 1,000 mcg  1,000 mcg Oral Daily Adrian SaranSital Mody, MD   1,000 mcg at 10/31/15 [id number]    Musculoskeletal: Strength & Muscle Tone: decreased Gait & Station: unable to stand Patient leans: N/A  Psychiatric Specialty Exam: Physical Exam  Nursing note and vitals reviewed. Constitutional: She appears well-nourished. She appears distressed.  HENT:  Head: Normocephalic and atraumatic.  Eyes: Conjunctivae are normal. Pupils are equal, round, and reactive to light.  Neck: Normal range of motion.  Cardiovascular: Normal heart sounds.   Respiratory: She is in respiratory distress.  GI: Soft.  Musculoskeletal: Normal range of motion.  Neurological: She is alert.  Skin: Skin is warm and dry.  Psychiatric: Judgment and thought content normal. Her affect is blunt.  Her speech is delayed. She is slowed. Cognition and memory are impaired. She exhibits a depressed mood.    Review of Systems  Constitutional: Negative.   HENT: Negative.   Eyes: Negative.   Respiratory: Negative.   Cardiovascular: Negative.   Gastrointestinal: Negative.   Musculoskeletal: Negative.   Skin: Negative.   Neurological: Negative.   Psychiatric/Behavioral: Positive for depression. Negative for suicidal ideas, hallucinations, memory loss and substance abuse. The patient is not nervous/anxious and does not have insomnia.     Blood pressure 124/55, pulse 48, temperature 97.8 F (36.6 C), temperature source Oral, resp. rate 18, height 5\' 3"  (1.6 m), weight 121.519 kg (267 lb 14.4 oz), SpO2 96 %.Body mass index is 47.47 kg/(m^2).  General Appearance: Disheveled  Eye Contact:  Fair  Speech:  Slow  Volume:  Decreased  Mood:  Dysphoric  Affect:  Constricted  Thought Process:  Goal Directed  Orientation:  Full (Time, Place, and Person)  Thought Content:  Logical  Suicidal Thoughts:  No  Homicidal Thoughts:  No  Memory:  Immediate;   Fair Recent;   Fair Remote;   Fair  Judgement:  Fair  Insight:  Fair  Psychomotor Activity:  Decreased  Concentration:  Concentration: Fair  Recall:  [employer]is[employer]Fair  Fund of Knowledge:  Fair  Language:  Fair  Akathisia:  No  Handed:  Right  AIMS (if indicated):     Assets:  Desire for Improvement Housing Resilience  ADL's:  Impaired  Cognition:  Impaired,  Mild  Sleep:        Treatment Plan Summary: Plan Case reviewed with hospitalist. I recommend discontinuing all lithium at this point. Patient is going to go to skilled nursing. She will need continued monitoring as her physical state recovers to see if her mood is getting better. Supportive counseling to the patient. No change otherwise to medicine today.  Disposition: Patient does not meet criteria for psychiatric inpatient admission. Supportive therapy provided about ongoing  stressors.  Mordecai RasmussenJohn Adrian Specht, MD 10/31/2015 5:48 PM

## 2015-10-31 NOTE — Progress Notes (Signed)
Subjective: Patient reports no improvement in feeling of weakness.    Objective: Current vital signs: BP 118/55 mmHg  Pulse 47  Temp(Src) 98.2 F (36.8 C) (Oral)  Resp 18  Ht 5' 3" (1.6 m)  Wt 121.519 kg (267 lb 14.4 oz)  BMI 47.47 kg/m2  SpO2 98% Vital signs in last 24 hours: Temp:  [97.6 F (36.4 C)-98.2 F (36.8 C)] 98.2 F (36.8 C) (06/15 2046) Pulse Rate:  [47-50] 47 (06/16 0604) Resp:  [16-18] 18 (06/16 0604) BP: (102-118)/(39-55) 118/55 mmHg (06/16 0604) SpO2:  [98 %-100 %] 98 % (06/16 0604)  Intake/Output from previous day: 06/15 0701 - 06/16 0700 In: 360 [P.O.:360] Out: 800 [Urine:800] Intake/Output this shift: Total I/O In: 240 [P.O.:240] Out: -  Nutritional status: Diet heart healthy/carb modified Room service appropriate?: Yes; Fluid consistency:: Thin  Neurologic Exam: Mental Status: Alert, oriented, thought content appropriate. Speech fluent without evidence of aphasia. Able to follow 3 step commands without difficulty. Cranial Nerves: II: Discs flat bilaterally; Visual fields grossly normal, pupils equal, round, reactive to light and accommodation III,IV, VI: ptosis not present, extra-ocular motions intact bilaterally V,VII: smile symmetric, facial light touch sensation normal bilaterally VIII: hearing normal bilaterally IX,X: gag reflex present XI: bilateral shoulder shrug XII: midline tongue extension Motor: Right :Upper extremity 5/5Left: Upper extremity 5/5 Lower extremity 5/5Lower extremity 5/5  Lab Results: Basic Metabolic Panel:  Recent Labs Lab 10/27/15 1032 10/28/15 0449 10/29/15 0458  NA 141 143 142  K 3.7 3.4* 3.7  CL 107 109 108  CO2 23 26 28  GLUCOSE 159* 110* 102*  BUN 9 10 11  CREATININE 1.13* 1.02* 1.04*  CALCIUM 9.4 9.2 9.3    Liver Function Tests:  Recent Labs Lab 10/29/15 0458  AST 19  ALT 15   ALKPHOS 80  BILITOT 0.4  PROT 6.5  ALBUMIN 3.8   No results for input(s): LIPASE, AMYLASE in the last 168 hours. No results for input(s): AMMONIA in the last 168 hours.  CBC:  Recent Labs Lab 10/27/15 1032 10/28/15 0449  WBC 11.7* 9.3  HGB 14.3 14.2  HCT 44.3 42.4  MCV 93.0 92.4  PLT 203 189    Cardiac Enzymes:  Recent Labs Lab 10/27/15 1032 10/28/15 0449  CKTOTAL  --  36*  TROPONINI <0.03  --     Lipid Panel: No results for input(s): CHOL, TRIG, HDL, CHOLHDL, VLDL, LDLCALC in the last 168 hours.  CBG:  Recent Labs Lab 10/30/15 0732 10/30/15 1132 10/30/15 1637 10/30/15 2114 10/31/15 0721  GLUCAP 122* 134* 97 146* 85    Microbiology: Results for orders placed or performed in visit on 06/16/11  Stool culture     Status: None   Collection Time: 06/17/11  9:12 PM  Result Value Ref Range Status   Micro Text Report   Final       COMMENT                   NO SALMONELLA OR SHIGELLA ISOLATED IN 3 DAYS   COMMENT                   NO PATHOGENIC E.COLI DETECTED   COMMENT                   NO CAMPYLOBACTER ANTIGEN DETECTED   ANTIBIOTIC                                                        Clostridium Difficile by PCR     Status: None   Collection Time: 06/17/11  9:12 PM  Result Value Ref Range Status   Micro Text Report   Final       COMMENT                   NEGATIVE-CLOS.DIFFICILE TOXIN NOT DETECTED BY PCR   ANTIBIOTIC                                                        Coagulation Studies: No results for input(s): LABPROT, INR in the last 72 hours.  Imaging: No results found.  Medications:  I have reviewed the patient's current medications. Scheduled: . ARIPiprazole  30 mg Oral QHS  . aspirin  81 mg Oral Daily  . clonazePAM  1 mg Oral QHS  . heparin  5,000 Units Subcutaneous Q8H  . insulin aspart  0-9 Units Subcutaneous TID WC  . levothyroxine  225 mcg Oral QAC breakfast  . linagliptin  5 mg Oral Daily  . lithium carbonate  300 mg Oral  QHS  . vitamin B-12  1,000 mcg Oral Daily    Assessment/Plan: Patient reports still being weak. Myasthenia panel, CK, ESR, CRP.  Patient on B12.    Recommendations: 1. Patient to have a NCV/EMG after discharge.      , MD Neurology [phone] 10/31/2015  10:25 AM     

## 2015-10-31 NOTE — Discharge Instructions (Signed)
Activity as tolerated Follow-up with primary care physician at the facility in 3 days Outpatient follow-up with psychiatry in a week PCP to consider monitoring on the thyroid function tests Diabetic diet

## 2015-10-31 NOTE — Progress Notes (Signed)
Physical Therapy Treatment Patient Details Name: Julie Morrow MRN: [id number] DOB: 1951-03-22 Today's Date: 10/31/2015    History of Present Illness Julie Morrow  is a 65 y.o. female with a known history of Hypothyroidism, depression, anxiety, diabetes, bipolar disorder- was recently admitted to psychiatric the inpatient unit and she stayed there for 10 days and discharged home to her daughter 5 days ago. As per daughter there was some medication changes made during that admission but patient gradually got worse physically in that admission and when she went home she was discharged home with a walker. Before shewas admitted last time to psychiatric unit she was able to walk on her own at home. As per daughter patient continued to get worse after she went home for last 5 days in spite of taking all her medications on time as prescribed, so she was concerned and brought the patient to emergency room back again today. As per daughter patient is also somewhat slow mentally and she has decreased appetite and slow thinking. Sometimes she is slightly confused also. She denies any urinary symptoms or fever or chills. Her TSH level was very high 2 month ago and during last admission in hospital, and as per the psychiatric note her thyroxine dose was changed by endocrinologist 2-3 months ago, and psychiatric is continue the same dose during last admission and discharge. TSH is found to be high again, but less than the previous report and urinalysis is still not collected. Because of patient's generalized weakness and decreased functional status at home and daughter strongly feels she may need some placement for more help at home and so ER physician suggested to admit to medical services for these issues. Pt reports 1 fall in the last 12 months which occurred approximately 1 week ago. She denies any injury with the fall.     PT Comments    Pt getting up with nurse to bed side commode upon arrival.  Mod a x 1 for  bed mobility.  She transferred to commode with min a x 2 for safety.  She only required min a x 1 for sit to stand transfers during the rest of session.  She was able to ambulate 30' x 2 then 10' x 1 with rolling walker with min a x 1.  Gait quality decreased on third attempt with poor step height and decreased balance.  Pt with poor self- assessment of fatigue level and needed verbal cues to stop ambulation for safety.   Follow Up Recommendations  SNF     Equipment Recommendations  None recommended by PT    Recommendations for Other Services       Precautions / Restrictions Precautions Precautions: Fall Restrictions Weight Bearing Restrictions: No    Mobility  Bed Mobility Overal bed mobility: Needs Assistance Bed Mobility: Supine to Sit     Supine to sit: Mod assist     General bed mobility comments: Pt requiring modA+1 for supine to sit. She moves very slow requiring hand over hand assistance for proper sequencing.  Transfers Overall transfer level: Needs assistance Equipment used: Rolling walker (2 wheeled) Transfers: Sit to/from Stand Sit to Stand: Min assist         General transfer comment: mod verbal cues for sequenign and hand placements  Ambulation/Gait Ambulation/Gait assistance: Min assist;+2 safety/equipment Ambulation Distance (Feet): 70 Feet ((30+30+10)) Assistive device: Rolling walker (2 wheeled) Gait Pattern/deviations: Step-through pattern;Decreased step length - right;Decreased step length - left;Shuffle Gait velocity: Decreased Gait velocity interpretation: <1.8 ft/sec, indicative  of risk for recurrent falls General Gait Details: generally steady initially but gait quality decreases with fatigue.   Stairs            Wheelchair Mobility    Modified Rankin (Stroke Patients Only)       Balance Overall balance assessment: Needs assistance Sitting-balance support: Feet supported Sitting balance-Leahy Scale: Good     Standing  balance support: Bilateral upper extremity supported Standing balance-Leahy Scale: Fair                      Cognition Arousal/Alertness: Awake/alert Behavior During Therapy: Flat affect Overall Cognitive Status: Impaired/Different from baseline                      Exercises      General Comments        Pertinent Vitals/Pain Pain Assessment: No/denies pain    Home Living                      Prior Function            PT Goals (current goals can now be found in the care plan section) Progress towards PT goals: Progressing toward goals    Frequency  Min 2X/week    PT Plan Current plan remains appropriate    Co-evaluation             End of Session Equipment Utilized During Treatment: Gait belt Activity Tolerance: Patient limited by fatigue Patient left: in chair;with call bell/phone within reach;with chair alarm set;with family/visitor present     Time: [id number] PT Time Calculation (min) (ACUTE ONLY): 15 min  Charges:  $Gait Training: 8-22 mins                    G Codes:      Danielle DessSarah Snow Peoples, PTA 10/31/2015, 12:35 PM

## 2015-10-31 NOTE — Progress Notes (Signed)
Mrs. Julie Morrow is alert and oriented. Orders to transfer to [facility]. Report called to accepting nurse. Belongings sent with patient with EMS. VSS. On 2L nasal cannula.

## 2015-10-31 NOTE — NC FL2 (Signed)
MEDICAID FL2 LEVEL OF CARE SCREENING TOOL     IDENTIFICATION  Patient Name: Julie Morrow Birthdate: 01/23/1951 Sex: female Admission Date (Current Location): 10/27/2015  [facility] [place] Number:  Chiropodist and Address:         Provider Number: [id number]  Attending Physician Name and Address:  Ramonita Lab, MD  Relative Name and Phone Number:       Current Level of Care: Hospital Recommended Level of Care: Skilled Nursing Facility Prior Approval Number:    Date Approved/Denied:   PASRR Number:    Discharge Plan: SNF    Current Diagnoses: Patient Active Problem List   Diagnosis Date Noted  . Generalized weakness 10/27/2015  . GERD (gastroesophageal reflux disease) 10/15/2015  . Bipolar 1 disorder, mixed, severe (HCC) 10/11/2015  . Diabetes type 2, controlled (HCC) 07/07/2015  . Obesity 07/07/2015  . Tobacco use disorder 07/07/2015  . Hypothyroidism 07/07/2015  . HLD (hyperlipidemia) 11/07/2014  . Psoriasis 11/07/2014    Orientation RESPIRATION BLADDER Height & Weight     Self, Time, Situation, Place  O2 (2L) Continent Weight: 267 lb 14.4 oz (121.519 kg) Height:   (160 cm)  BEHAVIORAL SYMPTOMS/MOOD NEUROLOGICAL BOWEL NUTRITION STATUS      Continent Diet (Heart Healthy/Carb Modified)  AMBULATORY STATUS COMMUNICATION OF NEEDS Skin   Extensive Assist Verbally Normal                       Personal Care Assistance Level of Assistance  Bathing, Feeding, Dressing Bathing Assistance: Limited assistance Feeding assistance: Independent Dressing Assistance: Limited assistance     Functional Limitations Info  Sight, Hearing, Speech Sight Info: Adequate Hearing Info: Adequate Speech Info: Adequate    SPECIAL CARE FACTORS FREQUENCY  PT (By licensed PT)                    Contractures      Additional Factors Info  Allergies, Code Status Code Status Info: Full Code Allergies Info: Allergies: Neomycin-bacitracin  Zn-polymyx           Current Medications (10/31/2015):  This is the current hospital active medication list Current Facility-Administered Medications  Medication Dose Route Frequency Provider Last Rate Last Dose  . ARIPiprazole (ABILIFY) tablet 30 mg  30 mg Oral QHS Altamese Dilling, MD   30 mg at 10/30/15 2114  . aspirin chewable tablet 81 mg  81 mg Oral Daily Altamese Dilling, MD   81 mg at 10/31/15 0951  . clonazePAM (KLONOPIN) tablet 1 mg  1 mg Oral QHS Altamese Dilling, MD   1 mg at 10/30/15 2115  . heparin injection 5,000 Units  5,000 Units Subcutaneous Q8H Altamese Dilling, MD   5,000 Units at 10/31/15 0512  . insulin aspart (novoLOG) injection 0-9 Units  0-9 Units Subcutaneous TID WC Altamese Dilling, MD   1 Units at 10/30/15 1227  . levothyroxine (SYNTHROID, LEVOTHROID) tablet 225 mcg  225 mcg Oral QAC breakfast Altamese Dilling, MD   225 mcg at 10/31/15 0949  . linagliptin (TRADJENTA) tablet 5 mg  5 mg Oral Daily Altamese Dilling, MD   5 mg at 10/31/15 0949  . lithium carbonate (LITHOBID) CR tablet 300 mg  300 mg Oral QHS Audery Amel, MD   300 mg at 10/30/15 2116  . vitamin B-12 (CYANOCOBALAMIN) tablet 1,000 mcg  1,000 mcg Oral Daily Adrian Saran, MD   1,000 mcg at 10/31/15 4540     Discharge Medications: Please see  discharge summary for a list of discharge medications.  Relevant Imaging Results:  Relevant Lab Results:   Additional Information SSN:  [id number]  Dede QuerySarah McNulty, LCSW

## 2015-10-31 NOTE — Progress Notes (Signed)
Anticoag Monitoring: Patient is a 65yo female admitted for weakness. Ordered Heparin 5000 units SQ q8h for DVT prevention. Last platelet count was 189 on 6/13.   Will check CBC with am labs per policy.  Clovia CuffLisa October Peery, PharmD, BCPS 10/31/2015 9:36 AM

## 2015-10-31 NOTE — Discharge Summary (Signed)
[facility] [place] [facility]   PATIENT NAME: Julie Morrow    MR#:  [id number]  DATE OF BIRTH:  [birth year]  DATE OF ADMISSION:  10/27/2015 ADMITTING PHYSICIAN: Altamese Dilling, MD  DATE OF DISCHARGE: 10/31/2015 PRIMARY CARE PHYSICIAN: Jerl Mina, MD    ADMISSION DIAGNOSIS:  Hypothyroidism, unspecified hypothyroidism type [E03.9]  DISCHARGE DIAGNOSIS:  Principal Problem:   Generalized weakness  Hypothyroidism Depression and bipolar disorder SECONDARY DIAGNOSIS:   Past Medical History  Diagnosis Date  . Hypothyroidism   . Depression   . Anxiety   . Diabetes mellitus, type II (HCC)     Patient takes Glucotrol and Januvia  . H/O diabetes mellitus 04/08/2015    borderline diabetes no medications needed.   . Bipolar 1 disorder [facility])     HOSPITAL COURSE:   65 year old female with a history of hypogonadism, depression and bipolar who was recently admitted to psychiatric unit who presented with generalized weakness.  1. Generalized weakness: This is likely due to deconditioning. Initial CT scan was questionable concerning for subdural hemorrhage. Repeat CT scan this morning does not show evidence of subdural hemorrhage. There is no evidence of acute stroke. Physical therapy Recommends short-term rehabilitation - care management aware the passar approved today Lithium level was within normal limits. normal CK level as patient is on fenofibrate   2. Hypothyroidism: Continue current increased dose of Synthroid with repeat thyroid function tests in 4 weeks. Dose increased to 25 g during the hospital course because of the abnormal TSH level  3. Depression and bipolar disorder: Patient was on lithium and has diagnosis of hypothyroid. Discontinued lithium as recommended  Appreciate psych consult. Outpatient follow-up with psychiatry as recommended  4. Diabetes: Continue glipizide, TRADJENTA and sliding scale insulin with ADA diet.  5.  HLD stopping statin due to weakness.  6. Hypokalemia: repleted and resolved  PT recommends STR/SNF  DISCHARGE CONDITIONS:   fair  CONSULTS OBTAINED:  Treatment Team:  Thana Farr, MD Audery Amel, MD   PROCEDURES  none  DRUG ALLERGIES:   Allergies  Allergen Reactions  . Neomycin-Bacitracin Zn-Polymyx Rash    DISCHARGE MEDICATIONS:   Current Discharge Medication List    START taking these medications   Details  albuterol (PROVENTIL) (2.5 MG/3ML) 0.083% nebulizer solution Take 3 mLs (2.5 mg total) by nebulization every 4 (four) hours as needed for wheezing or shortness of breath. Qty: 75 mL, Refills: 12    vitamin B-12 1000 MCG tablet Take 1 tablet (1,000 mcg total) by mouth daily.      CONTINUE these medications which have CHANGED   Details  clonazePAM (KLONOPIN) 1 MG tablet Take 1 tablet (1 mg total) by mouth at bedtime. Qty: 30 tablet, Refills: 0    levothyroxine (SYNTHROID, LEVOTHROID) 75 MCG tablet Take 3 tablets (225 mcg total) by mouth daily before breakfast. Qty: 30 tablet, Refills: 0      CONTINUE these medications which have NOT CHANGED   Details  ARIPiprazole (ABILIFY) 30 MG tablet Take 1 tablet (30 mg total) by mouth at bedtime. Qty: 30 tablet, Refills: 0    aspirin 81 MG chewable tablet Chew 81 mg by mouth daily.    fenofibrate 160 MG tablet Take 1 tablet (160 mg total) by mouth daily with breakfast. Qty: 30 tablet, Refills: 0    glipiZIDE (GLUCOTROL) 10 MG tablet Take 1 tablet (10 mg total) by mouth 2 (two) times daily before a meal. Qty: 60 tablet, Refills: 0    sitaGLIPtin (JANUVIA)  100 MG tablet Take 1 tablet (100 mg total) by mouth daily. Qty: 30 tablet, Refills: 0    ARIPiprazole 400 MG SUSR Inject 400 mg into the muscle every 30 (thirty) days. Qty: 1 each, Refills: 0      STOP taking these medications     lithium carbonate (LITHOBID) 300 MG CR tablet      rosuvastatin (CRESTOR) 20 MG tablet          DISCHARGE  INSTRUCTIONS:  Activity as tolerated Follow-up with primary care physician at the facility in 3 days Outpatient follow-up with psychiatry in a week PCP to consider monitoring on the thyroid function tests Diabetic diet    DIET:  Diabetic diet  DISCHARGE CONDITION:  Fair  ACTIVITY:  Activity as tolerated  OXYGEN:  Home Oxygen: Yes.     Oxygen Delivery: 2  liters/min via Patient connected to nasal cannula oxygen  DISCHARGE LOCATION:  nursing home   If you experience worsening of your admission symptoms, develop shortness of breath, life threatening emergency, suicidal or homicidal thoughts you must seek medical attention immediately by calling 911 or calling your MD immediately  if symptoms less severe.  You Must read complete instructions/literature along with all the possible adverse reactions/side effects for all the Medicines you take and that have been prescribed to you. Take any new Medicines after you have completely understood and accpet all the possible adverse reactions/side effects.   Please note  You were cared for by a hospitalist during your hospital stay. If you have any questions about your discharge medications or the care you received while you were in the hospital after you are discharged, you can call the unit and asked to speak with the hospitalist on call if the hospitalist that took care of you is not available. Once you are discharged, your primary care physician will handle any further medical issues. Please note that NO REFILLS for any discharge medications will be authorized once you are discharged, as it is imperative that you return to your primary care physician (or establish a relationship with a primary care physician if you do not have one) for your aftercare needs so that they can reassess your need for medications and monitor your lab values.     Today  Chief Complaint  Patient presents with  . Fatigue   Patient is resting comfortably. Denies  any shortness of breath, or chest pain. feeling weak  ROS:  CONSTITUTIONAL: Denies fevers, chills. Denies any fatigue, weakness.  EYES: Denies blurry vision, double vision, eye pain. EARS, NOSE, THROAT: Denies tinnitus, ear pain, hearing loss. RESPIRATORY: Denies cough, wheeze, shortness of breath.  CARDIOVASCULAR: Denies chest pain, palpitations, edema.  GASTROINTESTINAL: Denies nausea, vomiting, diarrhea, abdominal pain. Denies bright red blood per rectum. GENITOURINARY: Denies dysuria, hematuria. ENDOCRINE: Denies nocturia or thyroid problems. HEMATOLOGIC AND LYMPHATIC: Denies easy bruising or bleeding. SKIN: Denies rash or lesion. MUSCULOSKELETAL: Denies pain in neck, back, shoulder, knees, hips or arthritic symptoms.  NEUROLOGIC: Denies paralysis, paresthesias.  PSYCHIATRIC: Denies anxiety or depressive symptoms.   VITAL SIGNS:  Blood pressure 124/55, pulse 48, temperature 97.8 F (36.6 C), temperature source Oral, resp. rate 18, height 5\' 3"  (1.6 m), weight 121.519 kg (267 lb 14.4 oz), SpO2 96 %.  I/O:    Intake/Output Summary (Last 24 hours) at 10/31/15 1542 Last data filed at 10/31/15 0928  Gross per 24 hour  Intake    360 ml  Output    800 ml  Net   -440 ml  PHYSICAL EXAMINATION:  GENERAL:  65 y.o.-year-old patient lying in the bed with no acute distress.  EYES: Pupils equal, round, reactive to light and accommodation. No scleral icterus. Extraocular muscles intact.  HEENT: Head atraumatic, normocephalic. Oropharynx and nasopharynx clear.  NECK:  Supple, no jugular venous distention. No thyroid enlargement, no tenderness.  LUNGS: Normal breath sounds bilaterally, no wheezing, rales,rhonchi or crepitation. No use of accessory muscles of respiration.  CARDIOVASCULAR: S1, S2 normal. No murmurs, rubs, or gallops.  ABDOMEN: Soft, non-tender, non-distended. Bowel sounds present. No organomegaly or mass.  EXTREMITIES: No pedal edema, cyanosis, or clubbing.  NEUROLOGIC:  Cranial nerves II through XII are intact. Muscle strength 5/5 in all extremities. Sensation intact. Gait not checked.  PSYCHIATRIC: The patient is alert and oriented x 3.  SKIN: No obvious rash, lesion, or ulcer.   DATA REVIEW:   CBC  Recent Labs Lab 10/28/15 0449  WBC 9.3  HGB 14.2  HCT 42.4  PLT 189    Chemistries   Recent Labs Lab 10/29/15 0458  NA 142  K 3.7  CL 108  CO2 28  GLUCOSE 102*  BUN 11  CREATININE 1.04*  CALCIUM 9.3  AST 19  ALT 15  ALKPHOS 80  BILITOT 0.4    Cardiac Enzymes  Recent Labs Lab 10/27/15 1032  TROPONINI <0.03    Microbiology Results  Results for orders placed or performed in visit on 06/16/11  Stool culture     Status: None   Collection Time: 06/17/11  9:12 PM  Result Value Ref Range Status   Micro Text Report   Final       COMMENT                   NO SALMONELLA OR SHIGELLA ISOLATED IN 3 DAYS   COMMENT                   NO PATHOGENIC E.COLI DETECTED   COMMENT                   NO CAMPYLOBACTER ANTIGEN DETECTED   ANTIBIOTIC                                                      Clostridium Difficile by PCR     Status: None   Collection Time: 06/17/11  9:12 PM  Result Value Ref Range Status   Micro Text Report   Final       COMMENT                   NEGATIVE-CLOS.DIFFICILE TOXIN NOT DETECTED BY PCR   ANTIBIOTIC                                                        RADIOLOGY:  Ct Head Wo Contrast  10/28/2015  CLINICAL DATA:  Generalized weakness for several weeks EXAM: CT HEAD WITHOUT CONTRAST TECHNIQUE: Contiguous axial images were obtained from the base of the skull through the vertex without intravenous contrast. 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 Postsurgical changes are noted in the right cerebellar hemisphere consistent with the prior  surgery. No focus of increased attenuation is identified to suggest acute hemorrhage. The falx is again mildly  prominent but stable from the prior exam. No other findings to suggest acute hemorrhage are noted. No findings of acute infarct or other space-occupying mass lesion are seen. IMPRESSION: Postsurgical changes are noted.  No definitive hemorrhage is seen. Electronically Signed   By: Alcide Clever M.D.   On: 10/28/2015 09:21    EKG:   Orders placed or performed during the hospital encounter of 10/27/15  . ED EKG  . ED EKG  . EKG 12-Lead  . EKG 12-Lead      Management plans discussed with the patient, family and they are in agreement.  CODE STATUS:     Code Status Orders        Start     Ordered   10/27/15 1705  Full code   Continuous     10/27/15 1704    Code Status History    Date Active Date Inactive Code Status Order ID Comments User Context   10/11/2015  9:54 PM 10/22/2015  5:46 PM Full Code [id number]  Brandy Hale, MD Inpatient   07/01/2015  7:31 PM 07/08/2015  6:56 PM Full Code [id number]  Jimmy Footman, MD Inpatient   06/30/2015  8:10 PM 07/01/2015  7:31 PM Full Code [id number]  Lorre Nick, MD ED      TOTAL TIME TAKING CARE OF THIS PATIENT: 45  minutes.   Note: This dictation was prepared with Dragon dictation along with smaller phrase technology. Any transcriptional errors that result from this process are unintentional.   @[facility] @  on 10/31/2015 at 3:42 PM  Between 7am to 6pm - Pager - [phone]  After 6pm go to [email or web address] - password [email or web address] [facility]  [facility]  Office  [phone]  CC: Primary care physician; Jerl Mina, MD

## 2015-11-06 NOTE — Clinical Social Work Placement (Signed)
   CLINICAL SOCIAL WORK PLACEMENT  NOTE  Date:  11/06/2015  Patient Details  Name: Arther DamesSusan B Guynes MRN: [id number] Date of Birth: 12/04/1950  Clinical Social Work is seeking post-discharge placement for this patient at the Skilled  Nursing Facility level of care (*CSW will initial, date and re-position this form in  chart as items are completed):  Yes   Patient/family provided with [facility] Clinical Social Work Department's list of facilities offering this level of care within the geographic area requested by the patient (or if unable, by the patient's family).  Yes   Patient/family informed of their freedom to choose among providers that offer the needed level of care, that participate in Medicare, Medicaid or managed care program needed by the patient, have an available bed and are willing to accept the patient.  Yes   Patient/family informed of [facility]'s ownership interest in [facility] and [facility], as well as of the fact that they are under no obligation to receive care at these facilities.  PASRR submitted to EDS on 10/31/15     PASRR number received on 10/31/15     Existing PASRR number confirmed on       FL2 transmitted to all facilities in geographic area requested by pt/family on 10/31/15     FL2 transmitted to all facilities within larger geographic area on       Patient informed that his/her managed care company has contracts with or will negotiate with certain facilities, including the following:        Yes   Patient/family informed of bed offers received.  Patient [facility] bed at [facility]     Physician recommends and patient chooses bed at      Patient to be transferred to [facility] on 10/31/15.  Patient to be transferred to facility by [facility]     Patient family notified on 10/31/15 of transfer.  Name of family member notified:  Tonya     PHYSICIAN       Additional Comment:     _______________________________________________ Dede QuerySarah Eleasha Cataldo, LCSW 11/06/2015, 2:51 PM

## 2015-11-06 NOTE — Clinical Social Work Note (Signed)
Clinical Social Work Assessment  Patient Details  Name: Julie Morrow MRN: [id number] Date of Birth: May 16, 1951  Date of referral:  10/30/15               Reason for consult:  Facility Placement                Permission sought to share information with:  Family Supports Permission granted to share information::  Yes, Verbal Permission Granted  Name::     [employer]  Relationship::  son  Housing/Transportation Living arrangements for the past 2 months:  Single Family Home Source of Information:  Patient, Adult Children Patient Interpreter Needed:  None Criminal Activity/Legal Involvement Pertinent to Current Situation/Hospitalization:  No - Comment as needed Significant Relationships:  Adult Children, Other Family Members Lives with:  Adult Children Do you feel safe going back to the place where you live?  No (needs a higher level of care) Need for family participation in patient care:  Yes (Comment)  Care giving concerns:  Pt is in need of a higher level of care.   Social Worker assessment / plan:  CSW met with pt to address consult for new SNF. CSW introduced herself and explained role of social work. CSW also explained the process of discharging to SNF with [facility]. CSW also provided information on LTC at a SNF. Pt and family are agreeable to placement. CSW will initiate a bed search and will follow up with bed offers. CSW will continue to follow.   Employment status:  Retired Nurse, adult PT Recommendations:  [place] / Referral to community resources:  [place]  Patient/Family's Response to care:  Pt and family were appreciative of CSW support.   Patient/Family's Understanding of and Emotional Response to Diagnosis, Current Treatment, and Prognosis:  Pt understands that SNF is recommended at discharge.   Emotional Assessment Appearance:  Appears stated age Attitude/Demeanor/Rapport:  Other  (appropriate) Affect (typically observed):  Accepting, Adaptable, Pleasant Orientation:  Oriented to Self, Oriented to Place, Oriented to  Time, Oriented to Situation Alcohol / Substance use:  Never Used Psych involvement (Current and /or in the community):  No (Comment)  Discharge Needs  Concerns to be addressed:  Adjustment to Illness Readmission within the last 30 days:  Yes Current discharge risk:  Chronically ill Barriers to Discharge:  Continued Medical Work up   [employer], LCSW 11/06/2015, 2:55 PM

## 2015-11-06 NOTE — Clinical Social Work Note (Signed)
Pt ready for discharge to [facility]. [employer] auth obtained ([id number]). Facility has received discharge information and ready to admit pt. Pt and family are aware and agreeable to discharge plan. RN called report and EMS provided transportation. CSW is signing off as no further needs identified.   Dede QuerySarah Misaki Sozio, MSW, LCSW  Clinical Social Worker  [phone]

## 2015-11-06 NOTE — Clinical Social Work Note (Deleted)
Clinical Social Work Assessment  Patient Details  Name: Julie Morrow MRN: [id number] Date of Birth: 07/18/1950  Date of referral:  10/30/15               Reason for consult:  Facility Placement                Permission sought to share information with:  Family Supports Permission granted to share information::  Yes, Verbal Permission Granted  Name::     [employer]  Relationship::  daughter  Housing/Transportation Living arrangements for the past 2 months:  Single Family Home Source of Information:  Patient, Adult Children Patient Interpreter Needed:  None Criminal Activity/Legal Involvement Pertinent to Current Situation/Hospitalization:  No - Comment as needed Significant Relationships:  Adult Children, Other Family Members Lives with:  Adult Children Do you feel safe going back to the place where you live?  No (needs a higher level of care) Need for family participation in patient care:  Yes (Comment)  Care giving concerns: Pt is in need of a higher level of care.   Social Worker assessment / plan:  CSW met with pt and daughter to address consult for New SNF placement. CSW introduced herself and explained role of social work. CSW also explained the process of discharging to SNF with [facility]. Pt is agreeable to SNF for STR. SNF search was intiated and pt chose [facility]. CSW updated facility, however bed was not available. CSW spoke with family about this and daughter chose [facility]. CSW updated [facility] and [employer] Josem Kaufmann was started. CSW will continue to follow.   Employment status:  Retired Nurse, adult PT Recommendations:  [place] / Referral to community resources:  [place]  Patient/Family's Response to care:  Pt and daughter were appreciative of CSW support.   Patient/Family's Understanding of and Emotional Response to Diagnosis, Current Treatment, and Prognosis:  Pt  would like to go to SNF for rehab prior to returning home.  Emotional Assessment Appearance:  Appears stated age Attitude/Demeanor/Rapport:  Other (appropriate) Affect (typically observed):  Accepting, Adaptable, Pleasant Orientation:  Oriented to Self, Oriented to Place, Oriented to  Time, Oriented to Situation Alcohol / Substance use:  Never Used Psych involvement (Current and /or in the community):  No (Comment)  Discharge Needs  Concerns to be addressed:  Adjustment to Illness Readmission within the last 30 days:  Yes Current discharge risk:  Chronically ill Barriers to Discharge:  Continued Medical Work up   [employer], LCSW 11/06/2015, 2:40 PM

## 2016-03-25 ENCOUNTER — Telehealth: Payer: Self-pay | Admitting: *Deleted

## 2016-03-25 NOTE — Telephone Encounter (Signed)
Received VM from patient.   Requested to schedule appointment with Dr. Jeanice Limurham.   Please contact at [phone].

## 2016-05-05 NOTE — Telephone Encounter (Signed)
Tried calling patient to make an appt no answer

## 2016-05-06 NOTE — Telephone Encounter (Signed)
Left msg with Armando ReichertMarian family member asking her to have pt return my call.

## 2016-06-18 ENCOUNTER — Other Ambulatory Visit: Payer: Self-pay | Admitting: Psychiatry

## 2016-07-06 DIAGNOSIS — J069 Acute upper respiratory infection, unspecified: Secondary | ICD-10-CM | POA: Diagnosis not present

## 2016-07-06 DIAGNOSIS — R062 Wheezing: Secondary | ICD-10-CM | POA: Diagnosis not present

## 2016-07-06 DIAGNOSIS — Z20828 Contact with and (suspected) exposure to other viral communicable diseases: Secondary | ICD-10-CM | POA: Diagnosis not present

## 2016-09-30 DIAGNOSIS — B356 Tinea cruris: Secondary | ICD-10-CM | POA: Diagnosis not present

## 2016-12-09 DIAGNOSIS — E785 Hyperlipidemia, unspecified: Secondary | ICD-10-CM | POA: Diagnosis not present

## 2016-12-09 DIAGNOSIS — E119 Type 2 diabetes mellitus without complications: Secondary | ICD-10-CM | POA: Diagnosis not present

## 2016-12-09 DIAGNOSIS — R05 Cough: Secondary | ICD-10-CM | POA: Diagnosis not present

## 2016-12-09 DIAGNOSIS — E039 Hypothyroidism, unspecified: Secondary | ICD-10-CM | POA: Diagnosis not present

## 2016-12-09 DIAGNOSIS — Z720 Tobacco use: Secondary | ICD-10-CM | POA: Diagnosis not present

## 2016-12-16 DIAGNOSIS — E785 Hyperlipidemia, unspecified: Secondary | ICD-10-CM | POA: Diagnosis not present

## 2016-12-16 DIAGNOSIS — E039 Hypothyroidism, unspecified: Secondary | ICD-10-CM | POA: Diagnosis not present

## 2016-12-16 DIAGNOSIS — E119 Type 2 diabetes mellitus without complications: Secondary | ICD-10-CM | POA: Diagnosis not present

## 2016-12-17 DIAGNOSIS — E119 Type 2 diabetes mellitus without complications: Secondary | ICD-10-CM | POA: Diagnosis not present

## 2016-12-22 ENCOUNTER — Ambulatory Visit (INDEPENDENT_AMBULATORY_CARE_PROVIDER_SITE_OTHER): Payer: Medicare Other | Admitting: Internal Medicine

## 2016-12-22 ENCOUNTER — Ambulatory Visit (INDEPENDENT_AMBULATORY_CARE_PROVIDER_SITE_OTHER)
Admission: RE | Admit: 2016-12-22 | Discharge: 2016-12-22 | Disposition: A | Discharge status: Home / Self Care | Payer: Medicare Other | Source: Ambulatory Visit | Attending: Internal Medicine | Admitting: Internal Medicine

## 2016-12-22 ENCOUNTER — Encounter: Payer: Self-pay | Admitting: Internal Medicine

## 2016-12-22 VITALS — BP 124/74 | HR 66 | Ht 63.0 in | Wt 264.0 lb

## 2016-12-22 DIAGNOSIS — J449 Chronic obstructive pulmonary disease, unspecified: Secondary | ICD-10-CM | POA: Insufficient documentation

## 2016-12-22 DIAGNOSIS — F17210 Nicotine dependence, cigarettes, uncomplicated: Secondary | ICD-10-CM | POA: Diagnosis not present

## 2016-12-22 DIAGNOSIS — R05 Cough: Secondary | ICD-10-CM | POA: Diagnosis not present

## 2016-12-22 MED ORDER — METHYLPREDNISOLONE ACETATE 80 MG/ML IJ SUSP
120.0000 mg | Freq: Once | INTRAMUSCULAR | Status: AC
  Administered 2016-12-22: 120 mg via INTRAMUSCULAR

## 2016-12-22 NOTE — Assessment & Plan Note (Addendum)
Spirometry 12/22/2016  Nl flow/VC but abn f/v loop due to cough  - 12/22/2016   Walked RA x one lap @ 185 stopped due to  [employer] slow/nl pace, no desats   Symptoms are markedly disproportionate to objective findings and not clear this is actually much of a  lung problem but pt does appear to have difficult to sort out respiratory symptoms of unknown origin for which  DDX  = almost all start with A and  include Adherence, Ace Inhibitors, Acid Reflux, Active Sinus Disease, Alpha 1 Antitripsin deficiency, Anxiety masquerading as Airways dz,  ABPA,  Allergy(esp in young), Aspiration (esp in elderly), Adverse effects of meds,  Active smokers, A bunch of PE's (a small clot burden can't cause this syndrome unless there is already severe underlying pulm or vascular dz with poor reserve) plus two Bs  = Bronchiectasis and Beta blocker use..and one C= CHF     Adherence is always the initial "prime suspect" and is a multilayered concern that requires a "trust but verify" approach in every patient - starting with knowing how to use medications, especially inhalers, correctly, keeping up with refills and understanding the fundamental difference between maintenance and prns vs those medications only taken for a very short course and then stopped and not refilled.  - if returns needs   all meds in hand using a trust but verify approach to confirm accurate Medication  Reconciliation The principal here is that until we are certain that the  patients are doing what we've asked, it makes no sense to ask them to do more.   ? Acid (or non-acid) GERD > always difficult to exclude as up to 75% of pts in some series report no assoc GI/ Heartburn symptoms> rec max (24h)  acid suppression and diet restrictions/ reviewed and instructions given in writing.  - Of the three most common causes of  Sub-acute or recurrent or chronic cough, only one (GERD)  can actually contribute to/ trigger  the other two (asthma and post nasal drip syndrome)   and perpetuate the cylce of cough.  While not intuitively obvious, many patients with chronic low grade reflux do not cough until there is a primary insult that disturbs the protective epithelial barrier and exposes sensitive nerve endings.   This is typically viral but can be direct physical injury such as with an endotracheal tube.   The point is that once this occurs, it is difficult to eliminate the cycle  using anything but a maximally effective acid suppression regimen at least in the short run, accompanied by an appropriate diet to address non acid GERD and control / eliminate the cough itself for at least 3 days.    Active smoking (see separate a/p)    ? Allergy/asthma component > depomedrol 120 mg IM - no further steroids  ? Active sinus dz > augmentin should cover well so if cough persists needs sinus ct next    ? Bronchiectasis > would need HRCT chest to confirm and this seems unlikely as cough is a more recent issue that doesn't suggest what is typically  longstanding bronchiectasis in these settings  ? chf > no evidence of this   Total time devoted to counseling  > 50 % of initial 60 min office visit:  review case with pt/sister discussion of options/alternatives/ personally creating written customized instructions  in presence of pt  then going over those specific  Instructions directly with the pt including how to use all of the meds  but in particular covering each new medication in detail and the difference between the maintenance= "automatic" meds and the prns using an action plan format for the latter (If this problem/symptom => do that organization reading Left to right).  Please see AVS from this visit for a full list of these instructions which I personally wrote for this pt and  are unique to this visit.

## 2016-12-22 NOTE — Assessment & Plan Note (Signed)
Body mass index is 46.77 kg/m.  -  trending down slightly / encouraged  Lab Results  Component Value Date   TSH 27.047 (H) 10/27/2015     Contributing to gerd risk/ doe/reviewed the need and the process to achieve and maintain neg calorie balance > defer f/u primary care including intermittently monitoring thyroid status

## 2016-12-22 NOTE — Assessment & Plan Note (Signed)

## 2016-12-22 NOTE — Patient Instructions (Addendum)
Please remember to go to the  x-ray department downstairs in the basement  for your tests - we will call you with the results when they are available.  depomedrol 120 mg IM today       Try prilosec otc 20mg   Take 30-60 min before first meal of the day and Pepcid ac (famotidine) 20 mg one @  bedtime until cough is completely gone for at least a week without the need for cough suppression  For drainage / throat tickle try take CHLORPHENIRAMINE  4 mg - take one every 4 hours as needed - available over the counter- may cause drowsiness so start with just a bedtime dose or two and see how you tolerate it before trying in daytime    For cough > mucinex dm up to 1200 mg every 12 hours and do not use cough drops   Most importantly:  The key is to stop smoking completely before smoking completely stops you!   If not better in 2 weeks return with all medications in hand

## 2016-12-22 NOTE — Progress Notes (Signed)
Subjective:     Patient ID: Julie Morrow, female   DOB: 1951/03/30,     MRN: [id number]  HPI   59 yowf active smoker/MO with bipolar dz  previously very active including running late 1990's and wt around 150 and declined since then then cough x early / mid July 2018  Self referred to pulmonary clinic 12/22/2016       12/22/2016 1st [facility] office visit/ Julie Morrow   GOLD 0 criteria 12/22/2016 / still smoking / no resp meds  Chief Complaint  Patient presents with  . Pulmonary Consult    Self referral- c/o cough x 2 wks- prod at times with white to yellow sputum.  She also c/o SOB "all the time"- with or without any exertion.   has used inhalers in past and they helped - not sure what they were  Mucus is slt off white/ already zpak and augmentin no pattern day vs noct, not necessarily worse in am / hs not tried otc's or nasal rx but has persistent nasal symptoms since acute onset 2-3 weeks prior to OV  Of the cough  Baseline doe = MMRC3 = can't walk 100 yards even at a slow pace at a flat grade s stopping due to sob  - uses scooter for shopping at [facility].  No obvious patterns in day to day or daytime variability or assoc   mucus plugs or hemoptysis or cp or chest tightness, subjective wheeze or overt sinus or hb symptoms. No unusual exp hx or h/o childhood pna/ asthma or knowledge of premature birth.  Sleeping ok without nocturnal  or early am exacerbation  of respiratory  c/o's or need for noct saba. Also denies any obvious fluctuation of symptoms with weather or environmental changes or other aggravating or alleviating factors except as outlined above   Current Medications, Allergies, Complete Past Medical History, Past Surgical History, Family History, and Social History were reviewed in [employer] record.  ROS  The following are not active complaints unless bolded sore throat, dysphagia, dental problems, itching, sneezing,  nasal congestion or excess/ purulent  secretions, ear ache,   fever, chills, sweats, unintended wt loss, classically pleuritic or exertional cp,  orthopnea pnd or leg swelling, presyncope, palpitations, abdominal pain, anorexia, nausea, vomiting, diarrhea  or change in bowel or bladder habits, change in stools or urine, dysuria,hematuria,  rash, arthralgias, visual complaints, headache, numbness, weakness or ataxia or problems with walking or coordination,  change in mood/affect or memory.            Review of Systems     Objective:   Physical Exam    amb massively    Wt Readings from Last 3 Encounters:  12/22/16 264 lb (119.7 kg)  10/27/15 267 lb 14.4 oz (121.5 kg)  10/10/15 270 lb (122.5 kg)    Vital signs reviewed      - Note on arrival 02 sats  93% on RA      HEENT: nl dentition, turbinates bilaterally, and oropharynx.  external ear canals without cough reflex/ lots of wax    NECK :  without JVD/Nodes/TM/ nl carotid upstrokes bilaterally   LUNGS: no acc muscle use,  Nl contour chest with minimal exp rhonchi bilaterally with cough on exp > insp   CV:  RRR  no s3 or murmur or increase in P2, and no edema   ABD:  Massively obese but soft and nontender with poor  inspiratory excursion in the supine position. No bruits  or organomegaly appreciated, bowel sounds nl  MS:  Nl gait/ ext warm without deformities, calf tenderness, cyanosis or clubbing No obvious joint restrictions   SKIN: warm and dry without lesions    NEURO:  alert, approp, nl sensorium with  no motor or cerebellar deficits apparent.     CXR PA and Lateral:   12/22/2016 :    I personally reviewed images and  mpression as follows:   wnl   Assessment:

## 2016-12-28 ENCOUNTER — Telehealth: Payer: Self-pay | Admitting: Internal Medicine

## 2016-12-28 NOTE — Telephone Encounter (Signed)
Notes recorded by Nyoka CowdenWert, Michael B, MD on 12/22/2016 at 7:45 PM EDT Call pt: Reviewed cxr and no acute change so [facility]  Spoke with patient. She is aware of results. Nothing else needed at time of call.

## 2016-12-30 DIAGNOSIS — B351 Tinea unguium: Secondary | ICD-10-CM | POA: Diagnosis not present

## 2016-12-30 DIAGNOSIS — M79675 Pain in left toe(s): Secondary | ICD-10-CM | POA: Diagnosis not present

## 2016-12-30 DIAGNOSIS — L600 Ingrowing nail: Secondary | ICD-10-CM | POA: Diagnosis not present

## 2016-12-30 DIAGNOSIS — R946 Abnormal results of thyroid function studies: Secondary | ICD-10-CM | POA: Diagnosis not present

## 2017-03-13 ENCOUNTER — Emergency Department
Admission: EM | Admit: 2017-03-13 | Discharge: 2017-03-14 | Disposition: A | Discharge status: Home / Self Care | Payer: Medicare Other | Attending: Emergency Medicine | Admitting: Emergency Medicine

## 2017-03-13 DIAGNOSIS — M545 Low back pain: Secondary | ICD-10-CM | POA: Diagnosis present

## 2017-03-13 DIAGNOSIS — E119 Type 2 diabetes mellitus without complications: Secondary | ICD-10-CM | POA: Diagnosis not present

## 2017-03-13 DIAGNOSIS — F17210 Nicotine dependence, cigarettes, uncomplicated: Secondary | ICD-10-CM | POA: Insufficient documentation

## 2017-03-13 DIAGNOSIS — N10 Acute tubulo-interstitial nephritis: Secondary | ICD-10-CM | POA: Diagnosis not present

## 2017-03-13 DIAGNOSIS — Z7982 Long term (current) use of aspirin: Secondary | ICD-10-CM | POA: Diagnosis not present

## 2017-03-13 DIAGNOSIS — Z7984 Long term (current) use of oral hypoglycemic drugs: Secondary | ICD-10-CM | POA: Diagnosis not present

## 2017-03-13 DIAGNOSIS — N12 Tubulo-interstitial nephritis, not specified as acute or chronic: Secondary | ICD-10-CM | POA: Diagnosis not present

## 2017-03-13 DIAGNOSIS — E039 Hypothyroidism, unspecified: Secondary | ICD-10-CM | POA: Insufficient documentation

## 2017-03-13 DIAGNOSIS — J449 Chronic obstructive pulmonary disease, unspecified: Secondary | ICD-10-CM | POA: Insufficient documentation

## 2017-03-13 DIAGNOSIS — B49 Unspecified mycosis: Secondary | ICD-10-CM | POA: Diagnosis not present

## 2017-03-13 LAB — COMPREHENSIVE METABOLIC PANEL
ALT: 18 U/L (ref 14–54)
AST: 29 U/L (ref 15–41)
Albumin: 3.6 g/dL (ref 3.5–5.0)
Alkaline Phosphatase: 77 U/L (ref 38–126)
Anion gap: 9 (ref 5–15)
BUN: 14 mg/dL (ref 6–20)
CHLORIDE: 104 mmol/L (ref 101–111)
CO2: 27 mmol/L (ref 22–32)
CREATININE: 1.1 mg/dL — AB (ref 0.44–1.00)
Calcium: 9.1 mg/dL (ref 8.9–10.3)
GFR calc Af Amer: 59 mL/min — ABNORMAL LOW (ref >60)
GFR, EST NON AFRICAN AMERICAN: 51 mL/min — AB (ref >60)
Glucose, Bld: 154 mg/dL — ABNORMAL HIGH (ref 65–99)
Potassium: 3.8 mmol/L (ref 3.5–5.1)
Sodium: 140 mmol/L (ref 135–145)
Total Bilirubin: 0.6 mg/dL (ref 0.3–1.2)
Total Protein: 6.8 g/dL (ref 6.5–8.1)

## 2017-03-13 LAB — CBC WITH DIFFERENTIAL/PLATELET
Basophils Absolute: 0.1 10*3/uL (ref 0–0.1)
Basophils Relative: 1 %
EOS ABS: 0.2 10*3/uL (ref 0–0.7)
EOS PCT: 2 %
HCT: 41.9 % (ref 35.0–47.0)
HEMOGLOBIN: 14 g/dL (ref 12.0–16.0)
LYMPHS ABS: 2.1 10*3/uL (ref 1.0–3.6)
Lymphocytes Relative: 23 %
MCH: 30.5 pg (ref 26.0–34.0)
MCHC: 33.5 g/dL (ref 32.0–36.0)
MCV: 91.1 fL (ref 80.0–100.0)
MONOS PCT: 7 %
Monocytes Absolute: 0.6 10*3/uL (ref 0.2–0.9)
Neutro Abs: 6.1 10*3/uL (ref 1.4–6.5)
Neutrophils Relative %: 67 %
PLATELETS: 235 10*3/uL (ref 150–440)
RBC: 4.6 MIL/uL (ref 3.80–5.20)
RDW: 14.7 % — ABNORMAL HIGH (ref 11.5–14.5)
WBC: 9.1 10*3/uL (ref 3.6–11.0)

## 2017-03-13 LAB — URINALYSIS, ROUTINE W REFLEX MICROSCOPIC
BILIRUBIN URINE: NEGATIVE
Glucose, UA: NEGATIVE mg/dL
HGB URINE DIPSTICK: NEGATIVE
Ketones, ur: NEGATIVE mg/dL
NITRITE: POSITIVE — AB
PH: 6 (ref 5.0–8.0)
Protein, ur: NEGATIVE mg/dL
SPECIFIC GRAVITY, URINE: 1.004 — AB (ref 1.005–1.030)

## 2017-03-13 MED ORDER — NYSTATIN 100000 UNIT/GM EX CREA: 1.0000 "application " | TOPICAL_CREAM | Freq: Two times a day (BID) | CUTANEOUS | 0 refills | Status: DC

## 2017-03-13 MED ORDER — CEPHALEXIN 500 MG PO CAPS: 500.0000 mg | ORAL_CAPSULE | Freq: Three times a day (TID) | ORAL | 0 refills | Status: DC

## 2017-03-13 MED ORDER — CEFTRIAXONE SODIUM IN DEXTROSE 20 MG/ML IV SOLN
1.0000 g | Freq: Once | INTRAVENOUS | Status: AC
  Administered 2017-03-14: 1 g via INTRAVENOUS
  Filled 2017-03-13: qty 50

## 2017-03-13 NOTE — ED Provider Notes (Signed)
[facility] Emergency Department Provider Note    ____________________________________________   I have reviewed the triage vital signs and the nursing notes.   HISTORY  Chief Complaint Back Pain and Rash   History limited by: Not Limited   HPI Julie Morrow is a [age] y.o. female who presents to the emergency department today because of concerns for left flank pain.   LOCATION:left flank DURATION:3 days TIMING: constant SEVERITY: severe CONTEXT: denies any trauma MODIFYING FACTORS: none identified ASSOCIATED SYMPTOMS: patient denies any dysuria, bad odor to urine. No fevers. No trauma. No change in defecation. Has had painful rash to perineum.  Per medical record review patient has a history of fungal infections.  Past Medical History:  Diagnosis Date  . Anxiety   . Bipolar 1 disorder (HCC)   . Depression   . Diabetes mellitus, type II (HCC)    Patient takes Glucotrol and Januvia  . H/O diabetes mellitus 04/08/2015   borderline diabetes no medications needed.   . Hypothyroidism     Patient Active Problem List   Diagnosis Date Noted  . COPD GOLD 0 / still smoking  12/22/2016  . Generalized weakness 10/27/2015  . GERD (gastroesophageal reflux disease) 10/15/2015  . Bipolar 1 disorder, mixed, severe (HCC) 10/11/2015  . Diabetes type 2, controlled (HCC) 07/07/2015  . Morbid (severe) obesity due to excess calories (HCC) 07/07/2015  . Cigarette smoker 07/07/2015  . Hypothyroidism 07/07/2015  . HLD (hyperlipidemia) 11/07/2014  . Psoriasis 11/07/2014    Past Surgical History:  Procedure Laterality Date  . ABDOMINAL HYSTERECTOMY      Prior to Admission medications   Medication Sig Start Date End Date Taking? Authorizing Provider  ARIPiprazole (ABILIFY) 30 MG tablet Take 1 tablet (30 mg total) by mouth at bedtime. 10/21/15   Jimmy FootmanHernandez-Gonzalez, Andrea, MD  ARIPiprazole 400 MG SUSR Inject 400 mg into the muscle every 30 (thirty) days.  11/18/15   Jimmy FootmanHernandez-Gonzalez, Andrea, MD  aspirin 81 MG chewable tablet Chew 81 mg by mouth daily.    [provider]  clonazePAM (KLONOPIN) 1 MG tablet Take 1 tablet (1 mg total) by mouth at bedtime. 10/31/15   Ramonita LabGouru, Aruna, MD  fenofibrate 160 MG tablet Take 1 tablet (160 mg total) by mouth daily with breakfast. 10/21/15   Jimmy FootmanHernandez-Gonzalez, Andrea, MD  glipiZIDE (GLUCOTROL) 10 MG tablet Take 1 tablet (10 mg total) by mouth 2 (two) times daily before a meal. 10/21/15   Jimmy FootmanHernandez-Gonzalez, Andrea, MD  levothyroxine (SYNTHROID, LEVOTHROID) 75 MCG tablet Take 3 tablets (225 mcg total) by mouth daily before breakfast. 10/31/15   Gouru, Deanna ArtisAruna, MD  sitaGLIPtin (JANUVIA) 100 MG tablet Take 1 tablet (100 mg total) by mouth daily. 10/21/15   Jimmy FootmanHernandez-Gonzalez, Andrea, MD  vitamin B-12 1000 MCG tablet Take 1 tablet (1,000 mcg total) by mouth daily. 10/31/15   Ramonita LabGouru, Aruna, MD    Allergies Patient has no known allergies.  Family History  Problem Relation Age of Onset  . Dementia Mother   . Arthritis Father     Social History Social History  Substance Use Topics  . Smoking status: Current Every Day Smoker    Packs/day: 1.00    Years: 20.00    Types: Cigarettes    Start date: 11/06/1984  . Smokeless tobacco: Never Used  . Alcohol use No    Review of Systems Constitutional: No fever/chills Eyes: No visual changes. ENT: No sore throat. Cardiovascular: Denies chest pain. Respiratory: Denies shortness of breath. Gastrointestinal: Positive for left flank pain.  Genitourinary: Negative for dysuria. Musculoskeletal: Negative for back pain. Skin: Positive for rash to the perineum. Neurological: Negative for headaches, focal weakness or numbness.  ____________________________________________   PHYSICAL EXAM:  VITAL SIGNS: ED Triage Vitals  Enc Vitals Group     BP 03/13/17 2025 (!) 125/101     Pulse Rate 03/13/17 2025 69     Resp 03/13/17 2025 20     Temp 03/13/17 2025 98.7 F (37.1  C)     Temp Source 03/13/17 2025 Oral     SpO2 03/13/17 2025 95 %     Weight 03/13/17 2022 270 lb (122.5 kg)     Height 03/13/17 2022 5\' 3"  (1.6 m)     Head Circumference --      Peak Flow --      Pain Score 03/13/17 2022 10   Constitutional: Alert and oriented. Well appearing and in no distress. Eyes: Conjunctivae are normal.  ENT   Head: Normocephalic and atraumatic.   Nose: No congestion/rhinnorhea.   Mouth/Throat: Mucous membranes are moist.   Neck: No stridor. Hematological/Lymphatic/Immunilogical: No cervical lymphadenopathy. Cardiovascular: Normal rate, regular rhythm.  No murmurs, rubs, or gallops.  Respiratory: Normal respiratory effort without tachypnea nor retractions. Breath sounds are clear and equal bilaterally. No wheezes/rales/rhonchi. Gastrointestinal: Soft and non tender. No rebound. No guarding.  Genitourinary: Deferred Musculoskeletal: Normal range of motion in all extremities. No lower extremity edema. Neurologic:  Normal speech and language. No gross focal neurologic deficits are appreciated.  Skin:  Skin is warm, dry and intact. Rash consistent with fungal infection noted to perineum. Psychiatric: Mood and affect are normal. Speech and behavior are normal. Patient exhibits appropriate insight and judgment.  ____________________________________________    LABS (pertinent positives/negatives)  UA consistent with infection CBC wnl except rdw CMP glu 154, cr 1.10  ____________________________________________   EKG  None  ____________________________________________    RADIOLOGY  None  ____________________________________________   PROCEDURES  Procedures  ____________________________________________   INITIAL IMPRESSION / ASSESSMENT AND PLAN / ED COURSE  Pertinent labs & imaging results that were available during my care of the patient were reviewed by me and considered in my medical decision making (see chart for  details).  Differential diagnosis includes, but is not limited to, acute appendicitis, diverticulitis, urinary tract infection/pyelonephritis, endometriosis, bowel obstruction, colitis, renal colic, gastroenteritis, hernia, etc. Workup does show findings consistent with a urinary tract infection.  At this point I think pyelonephritis likely.  Patient will be given dose of antibiotic here in the emergency department.  Will discharge with further antibiotics.  Additionally patient likely has a fungal infection to the groin.  Will discharge with nystatin.  Discussed the findings and plan with the patient. _________________________________________   FINAL CLINICAL IMPRESSION(S) / ED DIAGNOSES  Final diagnoses:  Pyelonephritis  Fungal infection     Note: This dictation was prepared with Dragon dictation. Any transcriptional errors that result from this process are unintentional     Phineas Semen, MD 03/14/17 0010

## 2017-03-13 NOTE — ED Triage Notes (Signed)
Patient reports symptoms for 3 days.  Reports left lower back pain.  Patient also reports perineal area is raw.

## 2017-03-13 NOTE — ED Triage Notes (Signed)
Patient given update on wait time. Patient in no distress at this time.  

## 2017-03-13 NOTE — Discharge Instructions (Signed)
Please seek medical attention for any high fevers, chest pain, shortness of breath, change in behavior, persistent vomiting, bloody stool or any other new or concerning symptoms.  

## 2017-03-25 DIAGNOSIS — M545 Low back pain: Secondary | ICD-10-CM | POA: Diagnosis not present

## 2017-04-21 DIAGNOSIS — J40 Bronchitis, not specified as acute or chronic: Secondary | ICD-10-CM | POA: Diagnosis not present

## 2017-04-21 DIAGNOSIS — E1129 Type 2 diabetes mellitus with other diabetic kidney complication: Secondary | ICD-10-CM | POA: Diagnosis not present

## 2017-04-21 DIAGNOSIS — Z79899 Other long term (current) drug therapy: Secondary | ICD-10-CM | POA: Diagnosis not present

## 2017-05-30 ENCOUNTER — Other Ambulatory Visit: Payer: Self-pay

## 2017-05-30 ENCOUNTER — Encounter (HOSPITAL_COMMUNITY): Payer: Self-pay

## 2017-05-30 ENCOUNTER — Observation Stay (HOSPITAL_COMMUNITY)
Admission: EM | Admit: 2017-05-30 | Discharge: 2017-05-31 | Disposition: A | Discharge status: Home / Self Care | Payer: Medicare Other | Attending: Internal Medicine | Admitting: Internal Medicine

## 2017-05-30 ENCOUNTER — Emergency Department (HOSPITAL_COMMUNITY): Payer: Medicare Other

## 2017-05-30 DIAGNOSIS — J449 Chronic obstructive pulmonary disease, unspecified: Secondary | ICD-10-CM | POA: Insufficient documentation

## 2017-05-30 DIAGNOSIS — S199XXA Unspecified injury of neck, initial encounter: Secondary | ICD-10-CM | POA: Diagnosis not present

## 2017-05-30 DIAGNOSIS — Z7984 Long term (current) use of oral hypoglycemic drugs: Secondary | ICD-10-CM | POA: Insufficient documentation

## 2017-05-30 DIAGNOSIS — K219 Gastro-esophageal reflux disease without esophagitis: Secondary | ICD-10-CM | POA: Insufficient documentation

## 2017-05-30 DIAGNOSIS — Z79899 Other long term (current) drug therapy: Secondary | ICD-10-CM | POA: Diagnosis not present

## 2017-05-30 DIAGNOSIS — E119 Type 2 diabetes mellitus without complications: Secondary | ICD-10-CM | POA: Insufficient documentation

## 2017-05-30 DIAGNOSIS — R42 Dizziness and giddiness: Secondary | ICD-10-CM | POA: Diagnosis not present

## 2017-05-30 DIAGNOSIS — E039 Hypothyroidism, unspecified: Secondary | ICD-10-CM | POA: Diagnosis not present

## 2017-05-30 DIAGNOSIS — N179 Acute kidney failure, unspecified: Secondary | ICD-10-CM | POA: Insufficient documentation

## 2017-05-30 DIAGNOSIS — Z6841 Body Mass Index (BMI) 40.0 and over, adult: Secondary | ICD-10-CM | POA: Diagnosis not present

## 2017-05-30 DIAGNOSIS — F3163 Bipolar disorder, current episode mixed, severe, without psychotic features: Secondary | ICD-10-CM | POA: Diagnosis present

## 2017-05-30 DIAGNOSIS — R531 Weakness: Secondary | ICD-10-CM

## 2017-05-30 DIAGNOSIS — T56891A Toxic effect of other metals, accidental (unintentional), initial encounter: Secondary | ICD-10-CM | POA: Diagnosis present

## 2017-05-30 DIAGNOSIS — R51 Headache: Secondary | ICD-10-CM | POA: Diagnosis not present

## 2017-05-30 DIAGNOSIS — M542 Cervicalgia: Secondary | ICD-10-CM | POA: Diagnosis not present

## 2017-05-30 DIAGNOSIS — Z23 Encounter for immunization: Secondary | ICD-10-CM | POA: Diagnosis not present

## 2017-05-30 DIAGNOSIS — E785 Hyperlipidemia, unspecified: Secondary | ICD-10-CM | POA: Diagnosis not present

## 2017-05-30 DIAGNOSIS — Z7982 Long term (current) use of aspirin: Secondary | ICD-10-CM | POA: Diagnosis not present

## 2017-05-30 DIAGNOSIS — F17210 Nicotine dependence, cigarettes, uncomplicated: Secondary | ICD-10-CM | POA: Insufficient documentation

## 2017-05-30 DIAGNOSIS — R001 Bradycardia, unspecified: Secondary | ICD-10-CM | POA: Diagnosis not present

## 2017-05-30 DIAGNOSIS — R0602 Shortness of breath: Secondary | ICD-10-CM | POA: Diagnosis not present

## 2017-05-30 DIAGNOSIS — F319 Bipolar disorder, unspecified: Secondary | ICD-10-CM | POA: Insufficient documentation

## 2017-05-30 DIAGNOSIS — S0990XA Unspecified injury of head, initial encounter: Secondary | ICD-10-CM | POA: Diagnosis not present

## 2017-05-30 HISTORY — DX: Bradycardia, unspecified: R00.1

## 2017-05-30 HISTORY — DX: Toxic effect of other metals, accidental (unintentional), initial encounter: T56.891A

## 2017-05-30 LAB — URINALYSIS, ROUTINE W REFLEX MICROSCOPIC
BILIRUBIN URINE: NEGATIVE
GLUCOSE, UA: NEGATIVE mg/dL
HGB URINE DIPSTICK: NEGATIVE
Ketones, ur: NEGATIVE mg/dL
LEUKOCYTES UA: NEGATIVE
Nitrite: POSITIVE — AB
Protein, ur: NEGATIVE mg/dL
SPECIFIC GRAVITY, URINE: 1.018 (ref 1.005–1.030)
pH: 5 (ref 5.0–8.0)

## 2017-05-30 LAB — CBC
HEMATOCRIT: 44.4 % (ref 36.0–46.0)
Hemoglobin: 14.1 g/dL (ref 12.0–15.0)
MCH: 30.2 pg (ref 26.0–34.0)
MCHC: 31.8 g/dL (ref 30.0–36.0)
MCV: 95.1 fL (ref 78.0–100.0)
Platelets: 257 10*3/uL (ref 150–400)
RBC: 4.67 MIL/uL (ref 3.87–5.11)
RDW: 16.4 % — AB (ref 11.5–15.5)
WBC: 10.3 10*3/uL (ref 4.0–10.5)

## 2017-05-30 LAB — BASIC METABOLIC PANEL
Anion gap: 10 (ref 5–15)
BUN: 15 mg/dL (ref 6–20)
CO2: 21 mmol/L — AB (ref 22–32)
Calcium: 9.6 mg/dL (ref 8.9–10.3)
Chloride: 108 mmol/L (ref 101–111)
Creatinine, Ser: 1.32 mg/dL — ABNORMAL HIGH (ref 0.44–1.00)
GFR calc Af Amer: 48 mL/min — ABNORMAL LOW (ref >60)
GFR, EST NON AFRICAN AMERICAN: 41 mL/min — AB (ref >60)
GLUCOSE: 128 mg/dL — AB (ref 65–99)
POTASSIUM: 4.5 mmol/L (ref 3.5–5.1)
Sodium: 139 mmol/L (ref 135–145)

## 2017-05-30 LAB — GLUCOSE, CAPILLARY
GLUCOSE-CAPILLARY: 98 mg/dL (ref 65–99)
GLUCOSE-CAPILLARY: 138 mg/dL — AB (ref 65–99)

## 2017-05-30 LAB — HEMOGLOBIN A1C
HEMOGLOBIN A1C: 5.8 % — AB (ref 4.8–5.6)
MEAN PLASMA GLUCOSE: 119.76 mg/dL

## 2017-05-30 LAB — I-STAT TROPONIN, ED: TROPONIN I, POC: 0.01 ng/mL (ref 0.00–0.08)

## 2017-05-30 LAB — LITHIUM LEVEL
Lithium Lvl: 3.22 mmol/L (ref 0.60–1.20)
Lithium Lvl: 0.86 mmol/L (ref 0.60–1.20)

## 2017-05-30 LAB — TSH: TSH: 0.149 u[IU]/mL — ABNORMAL LOW (ref 0.350–4.500)

## 2017-05-30 MED ORDER — SENNOSIDES-DOCUSATE SODIUM 8.6-50 MG PO TABS
1.0000 | ORAL_TABLET | Freq: Every evening | ORAL | Status: DC | PRN
  Administered 2017-05-31: 1 via ORAL
  Filled 2017-05-30: qty 1

## 2017-05-30 MED ORDER — ENOXAPARIN SODIUM 40 MG/0.4ML ~~LOC~~ SOLN
40.0000 mg | SUBCUTANEOUS | Status: DC
  Administered 2017-05-30: 40 mg via SUBCUTANEOUS
  Filled 2017-05-30 (×2): qty 0.4

## 2017-05-30 MED ORDER — SODIUM CHLORIDE 0.9 % IV BOLUS (SEPSIS)
1000.0000 mL | Freq: Once | INTRAVENOUS | Status: AC
  Administered 2017-05-30: 1000 mL via INTRAVENOUS

## 2017-05-30 MED ORDER — LORAZEPAM 0.5 MG PO TABS: 0.5000 mg | ORAL_TABLET | Freq: Four times a day (QID) | ORAL | Status: DC | PRN

## 2017-05-30 MED ORDER — INSULIN ASPART 100 UNIT/ML ~~LOC~~ SOLN
0.0000 [IU] | Freq: Three times a day (TID) | SUBCUTANEOUS | Status: DC
  Administered 2017-05-31: 1 [IU] via SUBCUTANEOUS
  Administered 2017-05-31: 3 [IU] via SUBCUTANEOUS

## 2017-05-30 MED ORDER — SUVOREXANT 20 MG PO TABS: 20.0000 mg | ORAL_TABLET | Freq: Every day | ORAL | Status: DC

## 2017-05-30 MED ORDER — ONDANSETRON HCL 4 MG PO TABS: 4.0000 mg | ORAL_TABLET | Freq: Four times a day (QID) | ORAL | Status: DC | PRN

## 2017-05-30 MED ORDER — VITAMIN B-12 1000 MCG PO TABS: 1000.0000 ug | ORAL_TABLET | Freq: Every day | ORAL | Status: DC

## 2017-05-30 MED ORDER — SODIUM CHLORIDE 0.9 % IV SOLN
INTRAVENOUS | Status: DC
  Administered 2017-05-30: 22:00:00 via INTRAVENOUS
  Administered 2017-05-30: 100 mL/h via INTRAVENOUS

## 2017-05-30 MED ORDER — ASPIRIN 81 MG PO CHEW
81.0000 mg | CHEWABLE_TABLET | Freq: Every day | ORAL | Status: DC
  Administered 2017-05-30 – 2017-05-31 (×2): 81 mg via ORAL
  Filled 2017-05-30 (×2): qty 1

## 2017-05-30 MED ORDER — LAMOTRIGINE 100 MG PO TABS
200.0000 mg | ORAL_TABLET | Freq: Two times a day (BID) | ORAL | Status: DC
  Administered 2017-05-30 – 2017-05-31 (×2): 200 mg via ORAL
  Filled 2017-05-30: qty 1
  Filled 2017-05-30 (×2): qty 2
  Filled 2017-05-30: qty 1

## 2017-05-30 MED ORDER — ACETAMINOPHEN 650 MG RE SUPP: 650.0000 mg | Freq: Four times a day (QID) | RECTAL | Status: DC | PRN

## 2017-05-30 MED ORDER — LEVOTHYROXINE SODIUM 75 MCG PO TABS
225.0000 ug | ORAL_TABLET | Freq: Every day | ORAL | Status: DC
  Administered 2017-05-31: 225 ug via ORAL
  Filled 2017-05-30: qty 3

## 2017-05-30 MED ORDER — FENOFIBRATE 160 MG PO TABS
160.0000 mg | ORAL_TABLET | Freq: Every day | ORAL | Status: DC
  Administered 2017-05-31: 160 mg via ORAL
  Filled 2017-05-30: qty 1

## 2017-05-30 MED ORDER — INSULIN ASPART 100 UNIT/ML ~~LOC~~ SOLN: 0.0000 [IU] | Freq: Every day | SUBCUTANEOUS | Status: DC

## 2017-05-30 MED ORDER — ONDANSETRON HCL 4 MG/2ML IJ SOLN: 4.0000 mg | Freq: Four times a day (QID) | INTRAMUSCULAR | Status: DC | PRN

## 2017-05-30 MED ORDER — ARIPIPRAZOLE 10 MG PO TABS: 30.0000 mg | ORAL_TABLET | Freq: Every day | ORAL | Status: DC

## 2017-05-30 MED ORDER — ACETAMINOPHEN 325 MG PO TABS
650.0000 mg | ORAL_TABLET | Freq: Four times a day (QID) | ORAL | Status: DC | PRN
  Administered 2017-05-31: 650 mg via ORAL
  Filled 2017-05-30: qty 2

## 2017-05-30 NOTE — ED Notes (Signed)
Pt's sister pushed pt in wheelchair to bathroom, provided another hat, cup and wipes.

## 2017-05-30 NOTE — ED Notes (Signed)
Patient presents to ed c/o multiple falls and feeling dizzy. Family states her lithium  Level was drawn several weeks ago and was low so  Her lithium dose was increased  She started getting dizzy  Family states patient has fallen everyday since thurs . Fell yest. And hit her head c/o headache. Family states they  called  the doctor on thurs and suppose to have lithium drawn on fri. However the didn't get it drawn. Patient denies having a history of low heartrate. Denies cardiac hx.

## 2017-05-30 NOTE — ED Notes (Signed)
Pt insisted on ambulating to bathroom to obtain urine sample, hat, cup and wipes provided, put states she voided but was unable to collect a sample or measurement.  Materials thrown in trash.

## 2017-05-30 NOTE — Progress Notes (Signed)
Call received from Poison control and spoke to [name] inquiring Pt;s current lithium level, Julie Morrow Julie Morrow, BSN, Julie Morrow

## 2017-05-30 NOTE — ED Notes (Signed)
Patient transported to CT 

## 2017-05-30 NOTE — H&P (Signed)
History and Physical    Julie Morrow ZOX:[id number] DOB: January 09, 1951 DOA: 05/30/2017  PCP: Jerl Mina, MD Patient coming from: home  Chief Complaint: fall/dizzy  HPI: Julie Morrow is a 67 y.o. female with medical history significant for bipolar disorder, COPD, diabetes, GERD, hypothyroidism, obesity presents to the emergency Department chief complaint persistent dizziness/weakness with fall. Initial evaluation reveals bradycardia and elevated lithium level. [facility] are asked to admit.  Information is obtained from the patient and the chart. She states that about a week ago her lithium dose was increased. About 3 days ago she developed persistent dizziness generalized weakness and 4 days ago developed daily falls. She reports she fell yesterday hit her head denies losing consciousness or any other injury. She states she was "just standing there" and she fell however with further discussion she revealed she was getting up out of the bathtub became dizzy and fell. He denies any headache or visual disturbances prior to the fall. He denies chest pain palpitation shortness of breath lower extremity edema. She denies abdominal pain nausea vomiting diarrhea constipation melena bright red blood per rectum. She denies dysuria hematuria frequency or urgency. She denies fever chills recent travel or sick contacts. She does smoke and states she feels like she needs an inhaler.   ED Course: In the emergency department she's afebrile hemodynamically stable with a heart rate between 37 and 48. She is not hypoxic. She is provided with vigorous IV fluids  Review of Systems: As per HPI otherwise all other systems reviewed and are negative.   Ambulatory Status: Ambulates independently with a steady gait is independent with ADLs  Past Medical History:  Diagnosis Date  . Anxiety   . Bipolar 1 disorder (HCC)   . Bradycardia   . Depression   . Diabetes mellitus, type II (HCC)    Patient takes  Glucotrol and Januvia  . H/O diabetes mellitus 04/08/2015   borderline diabetes no medications needed.   . Hypothyroidism   . Lithium toxicity     Past Surgical History:  Procedure Laterality Date  . ABDOMINAL HYSTERECTOMY      Social History   Socioeconomic History  . Marital status: Single    Spouse name: Not on file  . Number of children: Not on file  . Years of education: Not on file  . Highest education level: Not on file  Social Needs  . Financial resource strain: Not on file  . Food insecurity - worry: Not on file  . Food insecurity - inability: Not on file  . Transportation needs - medical: Not on file  . Transportation needs - non-medical: Not on file  Occupational History  . Not on file  Tobacco Use  . Smoking status: Current Every Day Smoker    Packs/day: 1.00    Years: 20.00    Pack years: 20.00    Types: Cigarettes    Start date: 11/06/1984  . Smokeless tobacco: Never Used  Substance and Sexual Activity  . Alcohol use: No    Alcohol/week: 0.0 oz  . Drug use: No  . Sexual activity: No  Other Topics Concern  . Not on file  Social History Narrative  . Not on file    No Known Allergies  Family History  Problem Relation Age of Onset  . Dementia Mother   . Arthritis Father     Prior to Admission medications   Medication Sig Start Date End Date Taking? Authorizing Provider  aspirin 81 MG chewable tablet Chew  81 mg by mouth daily.   Yes [provider]  fenofibrate 160 MG tablet Take 1 tablet (160 mg total) by mouth daily with breakfast. 10/21/15  Yes Jimmy FootmanHernandez-Gonzalez, Andrea, MD  glipiZIDE (GLUCOTROL) 10 MG tablet Take 1 tablet (10 mg total) by mouth 2 (two) times daily before a meal. 10/21/15  Yes Jimmy FootmanHernandez-Gonzalez, Andrea, MD  lamoTRIgine (LAMICTAL) 100 MG tablet Take 200 mg by mouth 2 (two) times daily. 05/20/17  Yes [provider]  levothyroxine (SYNTHROID, LEVOTHROID) 75 MCG tablet Take 3 tablets (225 mcg total) by mouth daily  before breakfast. 10/31/15  Yes Gouru, Aruna, MD  lithium carbonate 300 MG capsule Take 900 mg by mouth at bedtime. 05/20/17  Yes [provider]  LORazepam (ATIVAN) 0.5 MG tablet Take 0.5 mg by mouth 4 (four) times daily as needed. 05/12/17  Yes [provider]  Suvorexant (BELSOMRA) 20 MG TABS Take 20 mg by mouth at bedtime. 08/21/16  Yes [provider]  ARIPiprazole (ABILIFY) 30 MG tablet Take 1 tablet (30 mg total) by mouth at bedtime. Patient not taking: Reported on 05/30/2017 10/21/15   Jimmy FootmanHernandez-Gonzalez, Andrea, MD  sitaGLIPtin (JANUVIA) 100 MG tablet Take 1 tablet (100 mg total) by mouth daily. Patient not taking: Reported on 05/30/2017 10/21/15   Jimmy FootmanHernandez-Gonzalez, Andrea, MD  vitamin B-12 1000 MCG tablet Take 1 tablet (1,000 mcg total) by mouth daily. Patient not taking: Reported on 05/30/2017 10/31/15   Ramonita LabGouru, Aruna, MD    Physical Exam: Vitals:   05/30/17 1200 05/30/17 1230 05/30/17 1300 05/30/17 1400  BP: 116/83 (!) 119/49 128/61 138/69  Pulse: (!) 42 (!) 37 (!) 42 (!) 48  Resp: 20 (!) 24 20 17   Temp:      TempSrc:      SpO2: 96% 96% 92% 97%  Weight:      Height:         General:  Appears calm and comfortable sitting on the side of the bed eating graham crackers Eyes:  PERRL, EOMI, normal lids, iris ENT:  grossly normal hearing, lips & tongue, mucous membranes of her mouth are moist and pink Neck:  no LAD, masses or thyromegaly Cardiovascular:  Bradycardic but regular, no m/r/g. No LE edema.  Respiratory:  Breath sounds slightly distant clear hear no wheeze no rhonchi no increased work of breathing Abdomen:  soft, ntnd, obese positive bowel sounds throughout no guarding or rebounding Skin:  no rash or induration seen on limited exam Musculoskeletal:  grossly normal tone BUE/BLE, good ROM, no bony abnormality Psychiatric:  grossly normal mood and affect, speech fluent and appropriate, AOx3 Neurologic:  CN 2-12 grossly intact, moves all extremities in  coordinated fashion, sensation intact  Labs on Admission: I have personally reviewed following labs and imaging studies  CBC: Recent Labs  Lab 05/30/17 1039  WBC 10.3  HGB 14.1  HCT 44.4  MCV 95.1  PLT 257   Basic Metabolic Panel: Recent Labs  Lab 05/30/17 1039  NA 139  K 4.5  CL 108  CO2 21*  GLUCOSE 128*  BUN 15  CREATININE 1.32*  CALCIUM 9.6   GFR: Estimated Creatinine Clearance: 53.2 mL/min (A) (by C-G formula based on SCr of 1.32 mg/dL (H)). Liver Function Tests: No results for input(s): AST, ALT, ALKPHOS, BILITOT, PROT, ALBUMIN in the last 168 hours. No results for input(s): LIPASE, AMYLASE in the last 168 hours. No results for input(s): AMMONIA in the last 168 hours. Coagulation Profile: No results for input(s): INR, PROTIME in the last 168  hours. Cardiac Enzymes: No results for input(s): CKTOTAL, CKMB, CKMBINDEX, TROPONINI in the last 168 hours. BNP (last 3 results) No results for input(s): PROBNP in the last 8760 hours. HbA1C: No results for input(s): HGBA1C in the last 72 hours. CBG: No results for input(s): GLUCAP in the last 168 hours. Lipid Profile: No results for input(s): CHOL, HDL, LDLCALC, TRIG, CHOLHDL, LDLDIRECT in the last 72 hours. Thyroid Function Tests: No results for input(s): TSH, T4TOTAL, FREET4, T3FREE, THYROIDAB in the last 72 hours. Anemia Panel: No results for input(s): VITAMINB12, FOLATE, FERRITIN, TIBC, IRON, RETICCTPCT in the last 72 hours. Urine analysis:    Component Value Date/Time   COLORURINE YELLOW (A) 03/13/2017 2239   APPEARANCEUR CLEAR (A) 03/13/2017 2239   APPEARANCEUR Cloudy 10/30/2012 1857   LABSPEC 1.004 (L) 03/13/2017 2239   LABSPEC 1.019 10/30/2012 1857   PHURINE 6.0 03/13/2017 2239   GLUCOSEU NEGATIVE 03/13/2017 2239   GLUCOSEU Negative 10/30/2012 1857   HGBUR NEGATIVE 03/13/2017 2239   BILIRUBINUR NEGATIVE 03/13/2017 2239   BILIRUBINUR Negative 10/30/2012 1857   KETONESUR NEGATIVE 03/13/2017 2239    PROTEINUR NEGATIVE 03/13/2017 2239   NITRITE POSITIVE (A) 03/13/2017 2239   LEUKOCYTESUR LARGE (A) 03/13/2017 2239   LEUKOCYTESUR Trace 10/30/2012 1857    Creatinine Clearance: Estimated Creatinine Clearance: 53.2 mL/min (A) (by C-G formula based on SCr of 1.32 mg/dL (H)).  Sepsis Labs: @LABRCNTIP (procalcitonin:4,lacticidven:4) )No results found for this or any previous visit (from the past 240 hour(s)).   Radiological Exams on Admission: Dg Chest 2 View  Result Date: 05/30/2017 CLINICAL DATA:  Shortness of breath and wheezing. EXAM: CHEST  2 VIEW COMPARISON:  12/22/2016 FINDINGS: Examination is degraded due to patient body habitus. Grossly unchanged enlarged cardiac silhouette and mediastinal contours with slight differences likely attributable to decreased lung volumes. Pulmonary vasculature appears less distinct on the present examination with cephalization of flow. Slight worsening of bilateral infrahilar opacities favored to represent atelectasis. No discrete focal airspace opacities. No pleural effusion or pneumothorax. No acute osseous abnormalities. IMPRESSION: Suspected mild pulmonary edema and bilateral infrahilar atelectasis on this hypoventilated body habitus degraded examination. Electronically Signed   By: Simonne Come M.D.   On: 05/30/2017 12:17   Ct Head Wo Contrast  Result Date: 05/30/2017 CLINICAL DATA:  Larey Seat striking the back of the head on the floor. Hematoma. Headache. EXAM: CT HEAD WITHOUT CONTRAST CT CERVICAL SPINE WITHOUT CONTRAST TECHNIQUE: Multidetector CT imaging of the head and cervical spine was performed following the standard protocol without intravenous contrast. Multiplanar CT image reconstructions of the cervical spine were also generated. COMPARISON:  None. FINDINGS: CT HEAD FINDINGS Brain: Previous suboccipital craniectomy on the right. Some atrophy of the right cerebellum with fluid collection at the distant craniectomy site. Chronic appearing small vessel  ischemic changes of the white matter. No sign of acute infarction, mass lesion, hemorrhage, hydrocephalus or extra-axial collection. Vascular: There is atherosclerotic calcification of the major vessels at the base of the brain. Skull: Otherwise negative. Sinuses/Orbits: Clear/normal Other: None CT CERVICAL SPINE FINDINGS Alignment: Straightening of the normal cervical lordosis. Skull base and vertebrae: No fracture or primary lesion. Soft tissues and spinal canal: Negative Disc levels: Ordinary spondylosis at C4-5, C5-6 and C6-7. Small osteophytes. Upper chest: Negative Other: None IMPRESSION: Head CT: No acute or traumatic finding. Previous right suboccipital craniectomy with some atrophy in the right cerebellum. Chronic small-vessel changes of the white matter. Cervical spine CT: No acute or traumatic finding. Ordinary spondylosis. Electronically Signed   By: Scherrie Bateman.D.  On: 05/30/2017 12:59   Ct Cervical Spine Wo Contrast  Result Date: 05/30/2017 CLINICAL DATA:  Larey Seat striking the back of the head on the floor. Hematoma. Headache. EXAM: CT HEAD WITHOUT CONTRAST CT CERVICAL SPINE WITHOUT CONTRAST TECHNIQUE: Multidetector CT imaging of the head and cervical spine was performed following the standard protocol without intravenous contrast. Multiplanar CT image reconstructions of the cervical spine were also generated. COMPARISON:  None. FINDINGS: CT HEAD FINDINGS Brain: Previous suboccipital craniectomy on the right. Some atrophy of the right cerebellum with fluid collection at the distant craniectomy site. Chronic appearing small vessel ischemic changes of the white matter. No sign of acute infarction, mass lesion, hemorrhage, hydrocephalus or extra-axial collection. Vascular: There is atherosclerotic calcification of the major vessels at the base of the brain. Skull: Otherwise negative. Sinuses/Orbits: Clear/normal Other: None CT CERVICAL SPINE FINDINGS Alignment: Straightening of the normal cervical  lordosis. Skull base and vertebrae: No fracture or primary lesion. Soft tissues and spinal canal: Negative Disc levels: Ordinary spondylosis at C4-5, C5-6 and C6-7. Small osteophytes. Upper chest: Negative Other: None IMPRESSION: Head CT: No acute or traumatic finding. Previous right suboccipital craniectomy with some atrophy in the right cerebellum. Chronic small-vessel changes of the white matter. Cervical spine CT: No acute or traumatic finding. Ordinary spondylosis. Electronically Signed   By: Paulina Fusi M.D.   On: 05/30/2017 12:59    EKG: Junctional bradycardia Possible Anterior infarct , age undetermined No significant change since last tracing  Assessment/Plan Principal Problem:   Bradycardia Active Problems:   HLD (hyperlipidemia)   Diabetes type 2, controlled (HCC)   Morbid (severe) obesity due to excess calories (HCC)   Cigarette smoker   Hypothyroidism   Bipolar 1 disorder, mixed, severe (HCC)   GERD (gastroesophageal reflux disease)   Generalized weakness   COPD GOLD 0 / still smoking    Lithium toxicity   Acute kidney injury (HCC)   #1. Bradycardia. Presumably related to lithium toxicity. EKG as noted above. No chest pain.  -Admit to telemetry -Hold lithium -Vigorous IV fluids -Repeat lithium level -Obtain a TSH  #2. Lithium toxicity. Patient's lithium dose increase over week ago. Symptomatic about 4 days ago. Lithium level 3.22. Per [facility] continue vigorous IV fluids and monitor and repeat lithium level -Vigorous IV fluids -Hold lithium -Serial lithium levels -dosage will need to be adjusted at discharge with close OP follow up  #3. Acute kidney injury. Mild. Creatinine 1.35 on admission. -IV fluids as noted above -Hold nephrotoxins -Monitor urine output -Recheck in the morning  #4. COPD. Not on home oxygen. Not on home inhalers or nebulizers. Chest x-ray mild pulmonary edema with atelectasis. Oxygen saturation level greater than 90% on room air  patient does continue to smoke Chart review indicates recently treated for bronchitis. Completed antibiotics -Outpatient follow-up  #5. Dizziness/generalized weakness. Likely related to #2. Improved at time of admission of admission. -IV fluids as noted above -Obtain orthostatic vital signs  #6. Diabetes. Oral agents. Recent hemoglobin A1c 5.8. Serum glucose 128 -Hold home oral agents for now -Monitor capillary blood sugars -Sliding scale as indicated -Carb modified diet  #7. Hypothyroidism. Home medications include Synthroid. -Follow TSH -continue Synthroid for now  #8. Morbid obesity. BMI 47.8 -Nutritional consult  #9. Tobacco use -Cessation counseling offered   DVT prophylaxis: lovenox  Code Status: full  Family Communication: none present  Disposition Plan: home hopefully 24 hours  Consults called: psychiatry  Admission status: obs    Gwenyth Bender MD [facility]  If 7PM-7AM, please contact night-coverage [email or web address] Password [facility]  05/30/2017, 2:07 PM

## 2017-05-30 NOTE — ED Notes (Signed)
Pt is adamant about standing, this NT asked pt to sit so that she does not fall. Pt continues to be on cardiac monitor.

## 2017-05-30 NOTE — ED Triage Notes (Signed)
Per Pt, Pt is coming from home where she reports getting lightheaded and falling backwards at home last night where she hit the back of her head. Denies any LOC. Reports being placed on Lithium recently.

## 2017-05-30 NOTE — ED Provider Notes (Signed)
[facility] EMERGENCY DEPARTMENT Provider Note   CSN: [id number] Arrival date & time: 05/30/17  1021     History   Chief Complaint Chief Complaint  Patient presents with  . Weakness  . Fall    HPI Julie Morrow is a 67 y.o. female.  HPI   Julie Morrow is a 67 y.o. female, with a history of DM, hypothyroidism, and bipolar, presenting to the ED following a fall that occurred last night.  Patient states she was standing still when she began to feel dizzy, "felt like I was drunk." Last night she lost her balance, fell backwards, and hit her head on the floor.  Patient had a headache last night, however, pain has since resolved. She states she has had this intermittent dizziness since January 10.  She adds that her PCP increased her lithium from 600 mg nightly to 900 mg nightly on January 7. Also endorses shortness of breath with exertion for the last few weeks. Patient is accompanied by her sister at the bedside. Denies chest pain, LOC, abdominal pain, vision abnormality, seizures, cough, fever/chills, neurologic deficits, neck/back pain, or any other abnormalities.    Past Medical History:  Diagnosis Date  . Anxiety   . Bipolar 1 disorder (HCC)   . Bradycardia   . Depression   . Diabetes mellitus, type II (HCC)    Patient takes Glucotrol and Januvia  . H/O diabetes mellitus 04/08/2015   borderline diabetes no medications needed.   . Hypothyroidism   . Lithium toxicity     Patient Active Problem List   Diagnosis Date Noted  . Acute kidney injury (HCC) 05/30/2017  . Bradycardia   . Lithium toxicity   . COPD GOLD 0 / still smoking  12/22/2016  . Generalized weakness 10/27/2015  . GERD (gastroesophageal reflux disease) 10/15/2015  . Bipolar 1 disorder, mixed, severe (HCC) 10/11/2015  . Diabetes type 2, controlled (HCC) 07/07/2015  . Morbid (severe) obesity due to excess calories (HCC) 07/07/2015  . Cigarette smoker 07/07/2015  . Hypothyroidism  07/07/2015  . HLD (hyperlipidemia) 11/07/2014  . Psoriasis 11/07/2014    Past Surgical History:  Procedure Laterality Date  . ABDOMINAL HYSTERECTOMY      OB History    No data available       Home Medications    Prior to Admission medications   Medication Sig Start Date End Date Taking? Authorizing Provider  aspirin 81 MG chewable tablet Chew 81 mg by mouth daily.   Yes [provider]  fenofibrate 160 MG tablet Take 1 tablet (160 mg total) by mouth daily with breakfast. 10/21/15  Yes Jimmy Footman, MD  glipiZIDE (GLUCOTROL) 10 MG tablet Take 1 tablet (10 mg total) by mouth 2 (two) times daily before a meal. 10/21/15  Yes Jimmy Footman, MD  lamoTRIgine (LAMICTAL) 100 MG tablet Take 200 mg by mouth 2 (two) times daily. 05/20/17  Yes [provider]  levothyroxine (SYNTHROID, LEVOTHROID) 75 MCG tablet Take 3 tablets (225 mcg total) by mouth daily before breakfast. 10/31/15  Yes Gouru, Aruna, MD  lithium carbonate 300 MG capsule Take 900 mg by mouth at bedtime. 05/20/17  Yes [provider]  LORazepam (ATIVAN) 0.5 MG tablet Take 0.5 mg by mouth 4 (four) times daily as needed. 05/12/17  Yes [provider]  Suvorexant (BELSOMRA) 20 MG TABS Take 20 mg by mouth at bedtime. 08/21/16  Yes [provider]  ARIPiprazole (ABILIFY) 30 MG tablet Take 1 tablet (30 mg total)  by mouth at bedtime. Patient not taking: Reported on 05/30/2017 10/21/15   Jimmy Footman, MD  sitaGLIPtin (JANUVIA) 100 MG tablet Take 1 tablet (100 mg total) by mouth daily. Patient not taking: Reported on 05/30/2017 10/21/15   Jimmy Footman, MD  vitamin B-12 1000 MCG tablet Take 1 tablet (1,000 mcg total) by mouth daily. Patient not taking: Reported on 05/30/2017 10/31/15   Ramonita Lab, MD    Family History Family History  Problem Relation Age of Onset  . Dementia Mother   . Arthritis Father     Social History Social History   Tobacco  Use  . Smoking status: Current Every Day Smoker    Packs/day: 1.00    Years: 20.00    Pack years: 20.00    Types: Cigarettes    Start date: 11/06/1984  . Smokeless tobacco: Never Used  Substance Use Topics  . Alcohol use: No    Alcohol/week: 0.0 oz  . Drug use: No     Allergies   Patient has no known allergies.   Review of Systems Review of Systems  Constitutional: Negative for chills and fever.  Respiratory: Positive for shortness of breath. Negative for cough.   Cardiovascular: Negative for chest pain.  Gastrointestinal: Negative for abdominal pain and vomiting.  Neurological: Positive for dizziness and headaches (resolved). Negative for syncope, weakness and numbness.  All other systems reviewed and are negative.    Physical Exam Updated Vital Signs BP 106/61 (BP Location: Left Arm)   Pulse (!) 44   Temp 98.1 F (36.7 C) (Oral)   Resp 16   Ht 5\' 3"  (1.6 m)   Wt 122.5 kg (270 lb)   SpO2 95%   BMI 47.83 kg/m   Physical Exam  Constitutional: She appears well-developed and well-nourished. No distress.  HENT:  Head: Normocephalic.  Minor tenderness to the occipital region with no noted swelling, wound, deformity, or instability.  No other signs of injury to the patient's scalp or face.  Eyes: Conjunctivae are normal.  Neck: Neck supple.  Cardiovascular: Normal rate, regular rhythm, normal heart sounds and intact distal pulses.  Pulmonary/Chest: Breath sounds normal. No respiratory distress.  Patient had no shortness of breath at rest.  She does have some tachypnea with minor exertion.  She states this is been the case for several weeks.  Abdominal: Soft. There is no tenderness. There is no guarding.  Musculoskeletal: She exhibits no edema.  Minor tenderness to midline cervical spine without deformity, step-off, swelling, or other abnormality. Normal motor function intact in all extremities and spine. No other midline spinal tenderness.   Lymphadenopathy:    She  has no cervical adenopathy.  Neurological: She is alert.  No sensory deficits.  No noted speech deficits. No aphasia. Patient handles oral secretions without difficulty. No noted swallowing defects.  Equal grip strength bilaterally. Strength 5/5 in the upper extremities. Strength 5/5 with flexion and extension of the hips, knees, and ankles bilaterally.  Difficulty eliciting reflexes. Negative Romberg. No gait disturbance.  Coordination intact including heel to shin and finger to nose.  Cranial nerves III-XII grossly intact.  No facial droop.   Skin: Skin is warm and dry. Capillary refill takes less than 2 seconds. She is not diaphoretic.  Psychiatric: She has a normal mood and affect. Her behavior is normal.  Nursing note and vitals reviewed.    ED Treatments / Results  Labs (all labs ordered are listed, but only abnormal results are displayed) Labs Reviewed  BASIC METABOLIC PANEL -  Abnormal; Notable for the following components:      Result Value   CO2 21 (*)    Glucose, Bld 128 (*)    Creatinine, Ser 1.32 (*)    GFR calc non Af Amer 41 (*)    GFR calc Af Amer 48 (*)    All other components within normal limits  CBC - Abnormal; Notable for the following components:   RDW 16.4 (*)    All other components within normal limits  LITHIUM LEVEL - Abnormal; Notable for the following components:   Lithium Lvl 3.22 (*)    All other components within normal limits  HEMOGLOBIN A1C - Abnormal; Notable for the following components:   Hgb A1c MFr Bld 5.8 (*)    All other components within normal limits  URINALYSIS, ROUTINE W REFLEX MICROSCOPIC  TSH  LITHIUM LEVEL  LITHIUM LEVEL  CBG MONITORING, ED  I-STAT TROPONIN, ED    EKG  EKG Interpretation  Date/Time:  Monday May 30 2017 10:39:33 EST Ventricular Rate:  43 PR Interval:    QRS Duration: 114 QT Interval:  480 QTC Calculation: 405 R Axis:   -9 Text Interpretation:  Junctional bradycardia Possible Anterior infarct ,  age undetermined No significant change since last tracing Confirmed by Gwyneth Sprout ([id number]) on 05/30/2017 12:42:42 PM       Radiology Dg Chest 2 View  Result Date: 05/30/2017 CLINICAL DATA:  Shortness of breath and wheezing. EXAM: CHEST  2 VIEW COMPARISON:  12/22/2016 FINDINGS: Examination is degraded due to patient body habitus. Grossly unchanged enlarged cardiac silhouette and mediastinal contours with slight differences likely attributable to decreased lung volumes. Pulmonary vasculature appears less distinct on the present examination with cephalization of flow. Slight worsening of bilateral infrahilar opacities favored to represent atelectasis. No discrete focal airspace opacities. No pleural effusion or pneumothorax. No acute osseous abnormalities. IMPRESSION: Suspected mild pulmonary edema and bilateral infrahilar atelectasis on this hypoventilated body habitus degraded examination. Electronically Signed   By: Simonne Come M.D.   On: 05/30/2017 12:17   Ct Head Wo Contrast  Result Date: 05/30/2017 CLINICAL DATA:  Larey Seat striking the back of the head on the floor. Hematoma. Headache. EXAM: CT HEAD WITHOUT CONTRAST CT CERVICAL SPINE WITHOUT CONTRAST TECHNIQUE: Multidetector CT imaging of the head and cervical spine was performed following the standard protocol without intravenous contrast. Multiplanar CT image reconstructions of the cervical spine were also generated. COMPARISON:  None. FINDINGS: CT HEAD FINDINGS Brain: Previous suboccipital craniectomy on the right. Some atrophy of the right cerebellum with fluid collection at the distant craniectomy site. Chronic appearing small vessel ischemic changes of the white matter. No sign of acute infarction, mass lesion, hemorrhage, hydrocephalus or extra-axial collection. Vascular: There is atherosclerotic calcification of the major vessels at the base of the brain. Skull: Otherwise negative. Sinuses/Orbits: Clear/normal Other: None CT CERVICAL SPINE  FINDINGS Alignment: Straightening of the normal cervical lordosis. Skull base and vertebrae: No fracture or primary lesion. Soft tissues and spinal canal: Negative Disc levels: Ordinary spondylosis at C4-5, C5-6 and C6-7. Small osteophytes. Upper chest: Negative Other: None IMPRESSION: Head CT: No acute or traumatic finding. Previous right suboccipital craniectomy with some atrophy in the right cerebellum. Chronic small-vessel changes of the white matter. Cervical spine CT: No acute or traumatic finding. Ordinary spondylosis. Electronically Signed   By: Paulina Fusi M.D.   On: 05/30/2017 12:59   Ct Cervical Spine Wo Contrast  Result Date: 05/30/2017 CLINICAL DATA:  Larey Seat striking the back of the head on  the floor. Hematoma. Headache. EXAM: CT HEAD WITHOUT CONTRAST CT CERVICAL SPINE WITHOUT CONTRAST TECHNIQUE: Multidetector CT imaging of the head and cervical spine was performed following the standard protocol without intravenous contrast. Multiplanar CT image reconstructions of the cervical spine were also generated. COMPARISON:  None. FINDINGS: CT HEAD FINDINGS Brain: Previous suboccipital craniectomy on the right. Some atrophy of the right cerebellum with fluid collection at the distant craniectomy site. Chronic appearing small vessel ischemic changes of the white matter. No sign of acute infarction, mass lesion, hemorrhage, hydrocephalus or extra-axial collection. Vascular: There is atherosclerotic calcification of the major vessels at the base of the brain. Skull: Otherwise negative. Sinuses/Orbits: Clear/normal Other: None CT CERVICAL SPINE FINDINGS Alignment: Straightening of the normal cervical lordosis. Skull base and vertebrae: No fracture or primary lesion. Soft tissues and spinal canal: Negative Disc levels: Ordinary spondylosis at C4-5, C5-6 and C6-7. Small osteophytes. Upper chest: Negative Other: None IMPRESSION: Head CT: No acute or traumatic finding. Previous right suboccipital craniectomy with  some atrophy in the right cerebellum. Chronic small-vessel changes of the white matter. Cervical spine CT: No acute or traumatic finding. Ordinary spondylosis. Electronically Signed   By: Paulina Fusi M.D.   On: 05/30/2017 12:59    Procedures .Critical Care Performed by: Anselm Pancoast, PA-C Authorized by: Anselm Pancoast, PA-C   Critical care provider statement:    Critical care time (minutes):  35   Critical care time was exclusive of:  Separately billable procedures and treating other patients   Critical care was necessary to treat or prevent imminent or life-threatening deterioration of the following conditions:  Toxidrome   Critical care was time spent personally by me on the following activities:  Development of treatment plan with patient or surrogate, discussions with consultants, examination of patient, obtaining history from patient or surrogate, re-evaluation of patient's condition, pulse oximetry, ordering and review of radiographic studies, ordering and review of laboratory studies and ordering and performing treatments and interventions   I assumed direction of critical care for this patient from another provider in my specialty: no      (including critical care time)  Medications Ordered in ED Medications  aspirin chewable tablet 81 mg (not administered)  fenofibrate tablet 160 mg (not administered)  lamoTRIgine (LAMICTAL) tablet 200 mg (not administered)  levothyroxine (SYNTHROID, LEVOTHROID) tablet 225 mcg (not administered)  LORazepam (ATIVAN) tablet 0.5 mg (not administered)  Suvorexant TABS 20 mg (not administered)  enoxaparin (LOVENOX) injection 40 mg (not administered)  0.9 %  sodium chloride infusion (not administered)  acetaminophen (TYLENOL) tablet 650 mg (not administered)    Or  acetaminophen (TYLENOL) suppository 650 mg (not administered)  ondansetron (ZOFRAN) tablet 4 mg (not administered)    Or  ondansetron (ZOFRAN) injection 4 mg (not administered)    senna-docusate (Senokot-S) tablet 1 tablet (not administered)  insulin aspart (novoLOG) injection 0-9 Units (not administered)  insulin aspart (novoLOG) injection 0-5 Units (not administered)  sodium chloride 0.9 % bolus 1,000 mL (0 mLs Intravenous Stopped 05/30/17 1424)     Initial Impression / Assessment and Plan / ED Course  I have reviewed the triage vital signs and the nursing notes.  Pertinent labs & imaging results that were available during my care of the patient were reviewed by me and considered in my medical decision making (see chart for details).  Clinical Course as of May 30 1433  Mon May 30, 2017  1250 Spoke with poison control. Collene Leyden, RN. Bradycardia is expected. Watch for change  in mental status or seizures; no need for nephrology consult/dialysis unless these occur. Hydrate with IV NS 1L then 27950mL/hr rate titrated to 1-442mL/kg/hr urine output. Repeat lithium level in 4 hours.   [SJ]    Clinical Course User Index [SJ] Marijean Montanye C, PA-C    Patient presents with a complaint of dizziness and a fall last night.  No acute abnormality on head CT.  No noted focal neuro deficits.  Patient with noted lithium toxicity.  No noted mental status change or seizures. She does have some bradycardia. Patient admitted for continued management.  Findings and plan of care discussed with Gwyneth SproutWhitney Plunkett, MD. Dr. Anitra LauthPlunkett personally evaluated and examined this patient.  Vitals:   05/30/17 1200 05/30/17 1230 05/30/17 1300 05/30/17 1400  BP: 116/83 (!) 119/49 128/61 138/69  Pulse: (!) 42 (!) 37 (!) 42 (!) 48  Resp: 20 (!) 24 20 17   Temp:      TempSrc:      SpO2: 96% 96% 92% 97%  Weight:      Height:          Final Clinical Impressions(s) / ED Diagnoses   Final diagnoses:  Lithium toxicity, accidental or unintentional, initial encounter    ED Discharge Orders    None       Concepcion LivingJoy, Shelene Krage C, PA-C 05/30/17 1435    Gwyneth SproutPlunkett, Whitney, MD 05/31/17 1044

## 2017-05-31 ENCOUNTER — Other Ambulatory Visit: Payer: Self-pay

## 2017-05-31 ENCOUNTER — Encounter (HOSPITAL_COMMUNITY): Payer: Self-pay | Admitting: General Practice

## 2017-05-31 DIAGNOSIS — R001 Bradycardia, unspecified: Secondary | ICD-10-CM | POA: Diagnosis not present

## 2017-05-31 DIAGNOSIS — E119 Type 2 diabetes mellitus without complications: Secondary | ICD-10-CM | POA: Diagnosis not present

## 2017-05-31 DIAGNOSIS — R531 Weakness: Secondary | ICD-10-CM

## 2017-05-31 DIAGNOSIS — F17210 Nicotine dependence, cigarettes, uncomplicated: Secondary | ICD-10-CM | POA: Diagnosis not present

## 2017-05-31 DIAGNOSIS — Z008 Encounter for other general examination: Secondary | ICD-10-CM

## 2017-05-31 DIAGNOSIS — F3163 Bipolar disorder, current episode mixed, severe, without psychotic features: Secondary | ICD-10-CM | POA: Diagnosis not present

## 2017-05-31 DIAGNOSIS — T56891A Toxic effect of other metals, accidental (unintentional), initial encounter: Secondary | ICD-10-CM | POA: Diagnosis not present

## 2017-05-31 DIAGNOSIS — Z810 Family history of intellectual disabilities: Secondary | ICD-10-CM

## 2017-05-31 DIAGNOSIS — N179 Acute kidney failure, unspecified: Secondary | ICD-10-CM

## 2017-05-31 LAB — LITHIUM LEVEL: Lithium Lvl: 0.75 mmol/L (ref 0.60–1.20)

## 2017-05-31 LAB — BASIC METABOLIC PANEL
Anion gap: 11 (ref 5–15)
BUN: 15 mg/dL (ref 6–20)
CO2: 20 mmol/L — AB (ref 22–32)
Calcium: 9 mg/dL (ref 8.9–10.3)
Chloride: 107 mmol/L (ref 101–111)
Creatinine, Ser: 1.32 mg/dL — ABNORMAL HIGH (ref 0.44–1.00)
GFR calc Af Amer: 48 mL/min — ABNORMAL LOW (ref >60)
GFR calc non Af Amer: 41 mL/min — ABNORMAL LOW (ref >60)
GLUCOSE: 119 mg/dL — AB (ref 65–99)
POTASSIUM: 4 mmol/L (ref 3.5–5.1)
Sodium: 138 mmol/L (ref 135–145)

## 2017-05-31 LAB — GLUCOSE, CAPILLARY
GLUCOSE-CAPILLARY: 135 mg/dL — AB (ref 65–99)
GLUCOSE-CAPILLARY: 161 mg/dL — AB (ref 65–99)

## 2017-05-31 LAB — CBC
HEMATOCRIT: 41.7 % (ref 36.0–46.0)
Hemoglobin: 13 g/dL (ref 12.0–15.0)
MCH: 29.6 pg (ref 26.0–34.0)
MCHC: 31.2 g/dL (ref 30.0–36.0)
MCV: 95 fL (ref 78.0–100.0)
Platelets: 249 10*3/uL (ref 150–400)
RBC: 4.39 MIL/uL (ref 3.87–5.11)
RDW: 16.5 % — ABNORMAL HIGH (ref 11.5–15.5)
WBC: 10 10*3/uL (ref 4.0–10.5)

## 2017-05-31 MED ORDER — INFLUENZA VAC SPLIT HIGH-DOSE 0.5 ML IM SUSY: 0.5000 mL | PREFILLED_SYRINGE | INTRAMUSCULAR | Status: DC

## 2017-05-31 MED ORDER — LEVOTHYROXINE SODIUM 100 MCG PO TABS: 200.0000 ug | ORAL_TABLET | Freq: Every day | ORAL | Status: DC

## 2017-05-31 MED ORDER — INFLUENZA VAC SPLIT HIGH-DOSE 0.5 ML IM SUSY
0.5000 mL | PREFILLED_SYRINGE | INTRAMUSCULAR | Status: AC
  Administered 2017-05-31: 0.5 mL via INTRAMUSCULAR
  Filled 2017-05-31: qty 0.5

## 2017-05-31 MED ORDER — LEVOTHYROXINE SODIUM 200 MCG PO TABS: 200.0000 ug | ORAL_TABLET | Freq: Every day | ORAL | 0 refills | Status: DC

## 2017-05-31 NOTE — Care Management Obs Status (Signed)
MEDICARE OBSERVATION STATUS NOTIFICATION   Patient Details  Name: Julie Morrow MRN: [id number] Date of Birth: 1951-04-22   Medicare Observation Status Notification Given:  Yes    Gala LewandowskyGraves-Bigelow, Annamaria Salah Kaye, RN 05/31/2017, 2:46 PM

## 2017-05-31 NOTE — Discharge Summary (Signed)
Physician Discharge Summary  Julie Morrow XBM:[id number] DOB: 1950-07-06 DOA: 05/30/2017  PCP: Jerl Mina, MD  Admit date: 05/30/2017 Discharge date: 05/31/2017   Recommendations for Outpatient Follow-Up:   1. Outpatient stress test 2. Psych to address lithium 3. BMP 1 week   Discharge Diagnosis:   Principal Problem:   Bradycardia Active Problems:   HLD (hyperlipidemia)   Diabetes type 2, controlled (HCC)   Morbid (severe) obesity due to excess calories (HCC)   Cigarette smoker   Hypothyroidism   Bipolar 1 disorder, mixed, severe (HCC)   GERD (gastroesophageal reflux disease)   Generalized weakness   COPD GOLD 0 / still smoking    Lithium toxicity   Acute kidney injury [facility])   Discharge disposition:  Home.  Discharge Condition: Improved.  Diet recommendation: Low sodium, heart healthy.  Carbohydrate-modified  Wound care: None.   History of Present Illness:   Julie Morrow is a 67 y.o. female with a Past Medical History of hypothyroidism; DM; and bipolar DO who presents with dizziness and a fall.  On further discussion, her Lithium was increased last Tuesday or Wednesday from 2 to 3 tablets.  Thursday, she developed symptoms of dizziness.  Her symptoms have progressed and today she had slurred speech as well.  In the ER she was found to have bradycardia into the 30-40 range; this is already improved to the 60s.  She is currently comfortable and no longer dizzy.  Exam is unremarkable other than patient being disheveled and making poor eye contact.  She is quite pleasant and appears to understand the issues.  Will hold Lithium and continue to monitor levels, but anticipate ongoing resolution of the toxicity overnight.   Will ask BH to see the patient prior to d/c to advise on whether or not to resume the Lithium at a lower dose.   Hospital Course by Problem:   Bradycardia.  -improved with exertion -outpatient sleep study  Lithium toxicity. Patient's lithium  dose increase over week ago. Symptomatic about 4 days ago. Lithium level 3.22. Per [facility] continue vigorous IV fluids and monitor and repeat lithium level -patient resistant to dose and type recommendations by psych--- would prefer to follow up with psych  Acute kidney injury -improved with IVF -PCP to recheck  COPD.  -stable  Dizziness/generalized weakness. -resolved  Diabetes. Oral agents. Recent hemoglobin A1c 5.8. Serum glucose 128 -resume home meds  Hypothyroidism. Home medications include Synthroid. -decrease dose of synthroid based on TSH  Morbid obesity. BMI 47.8 -Nutritional consult  Tobacco use -Cessation counselin      Medical Consultants:    psych   Discharge Exam:   Vitals:   05/30/17 1927 05/31/17 0312  BP: 111/75 (!) 145/75  Pulse: (!) 45 (!) 42  Resp: (!) 21 20  Temp: 97.8 F (36.6 C) 97.8 F (36.6 C)  SpO2: 96% 95%   Vitals:   05/30/17 1857 05/30/17 1927 05/31/17 0300 05/31/17 0312  BP: (!) 128/50 111/75  (!) 145/75  Pulse:  (!) 45  (!) 42  Resp:  (!) 21  20  Temp: 98.3 F (36.8 C) 97.8 F (36.6 C)  97.8 F (36.6 C)  TempSrc: Oral Oral  Axillary  SpO2: 96% 96%  95%  Weight: 120 kg (264 lb 8.8 oz)  120.3 kg (265 lb 3.2 oz)   Height: 5\' 3"  (1.6 m)       Gen:  NAD    The results of significant diagnostics from this hospitalization (including imaging, microbiology,  ancillary and laboratory) are listed below for reference.     Procedures and Diagnostic Studies:   Dg Chest 2 View  Result Date: 05/30/2017 CLINICAL DATA:  Shortness of breath and wheezing. EXAM: CHEST  2 VIEW COMPARISON:  12/22/2016 FINDINGS: Examination is degraded due to patient body habitus. Grossly unchanged enlarged cardiac silhouette and mediastinal contours with slight differences likely attributable to decreased lung volumes. Pulmonary vasculature appears less distinct on the present examination with cephalization of flow. Slight worsening  of bilateral infrahilar opacities favored to represent atelectasis. No discrete focal airspace opacities. No pleural effusion or pneumothorax. No acute osseous abnormalities. IMPRESSION: Suspected mild pulmonary edema and bilateral infrahilar atelectasis on this hypoventilated body habitus degraded examination. Electronically Signed   By: Simonne ComeJohn  Watts M.D.   On: 05/30/2017 12:17   Ct Head Wo Contrast  Result Date: 05/30/2017 CLINICAL DATA:  Larey SeatFell striking the back of the head on the floor. Hematoma. Headache. EXAM: CT HEAD WITHOUT CONTRAST CT CERVICAL SPINE WITHOUT CONTRAST TECHNIQUE: Multidetector CT imaging of the head and cervical spine was performed following the standard protocol without intravenous contrast. Multiplanar CT image reconstructions of the cervical spine were also generated. COMPARISON:  None. FINDINGS: CT HEAD FINDINGS Brain: Previous suboccipital craniectomy on the right. Some atrophy of the right cerebellum with fluid collection at the distant craniectomy site. Chronic appearing small vessel ischemic changes of the white matter. No sign of acute infarction, mass lesion, hemorrhage, hydrocephalus or extra-axial collection. Vascular: There is atherosclerotic calcification of the major vessels at the base of the brain. Skull: Otherwise negative. Sinuses/Orbits: Clear/normal Other: None CT CERVICAL SPINE FINDINGS Alignment: Straightening of the normal cervical lordosis. Skull base and vertebrae: No fracture or primary lesion. Soft tissues and spinal canal: Negative Disc levels: Ordinary spondylosis at C4-5, C5-6 and C6-7. Small osteophytes. Upper chest: Negative Other: None IMPRESSION: Head CT: No acute or traumatic finding. Previous right suboccipital craniectomy with some atrophy in the right cerebellum. Chronic small-vessel changes of the white matter. Cervical spine CT: No acute or traumatic finding. Ordinary spondylosis. Electronically Signed   By: Paulina FusiMark  Shogry M.D.   On: 05/30/2017 12:59    Ct Cervical Spine Wo Contrast  Result Date: 05/30/2017 CLINICAL DATA:  Larey SeatFell striking the back of the head on the floor. Hematoma. Headache. EXAM: CT HEAD WITHOUT CONTRAST CT CERVICAL SPINE WITHOUT CONTRAST TECHNIQUE: Multidetector CT imaging of the head and cervical spine was performed following the standard protocol without intravenous contrast. Multiplanar CT image reconstructions of the cervical spine were also generated. COMPARISON:  None. FINDINGS: CT HEAD FINDINGS Brain: Previous suboccipital craniectomy on the right. Some atrophy of the right cerebellum with fluid collection at the distant craniectomy site. Chronic appearing small vessel ischemic changes of the white matter. No sign of acute infarction, mass lesion, hemorrhage, hydrocephalus or extra-axial collection. Vascular: There is atherosclerotic calcification of the major vessels at the base of the brain. Skull: Otherwise negative. Sinuses/Orbits: Clear/normal Other: None CT CERVICAL SPINE FINDINGS Alignment: Straightening of the normal cervical lordosis. Skull base and vertebrae: No fracture or primary lesion. Soft tissues and spinal canal: Negative Disc levels: Ordinary spondylosis at C4-5, C5-6 and C6-7. Small osteophytes. Upper chest: Negative Other: None IMPRESSION: Head CT: No acute or traumatic finding. Previous right suboccipital craniectomy with some atrophy in the right cerebellum. Chronic small-vessel changes of the white matter. Cervical spine CT: No acute or traumatic finding. Ordinary spondylosis. Electronically Signed   By: Paulina FusiMark  Shogry M.D.   On: 05/30/2017 12:59  Labs:   Basic Metabolic Panel: Recent Labs  Lab 05/30/17 1039 05/31/17 0449  NA 139 138  K 4.5 4.0  CL 108 107  CO2 21* 20*  GLUCOSE 128* 119*  BUN 15 15  CREATININE 1.32* 1.32*  CALCIUM 9.6 9.0   GFR Estimated Creatinine Clearance: 52.7 mL/min (A) (by C-G formula based on SCr of 1.32 mg/dL (H)). Liver Function Tests: No results for input(s):  AST, ALT, ALKPHOS, BILITOT, PROT, ALBUMIN in the last 168 hours. No results for input(s): LIPASE, AMYLASE in the last 168 hours. No results for input(s): AMMONIA in the last 168 hours. Coagulation profile No results for input(s): INR, PROTIME in the last 168 hours.  CBC: Recent Labs  Lab 05/30/17 1039 05/31/17 0449  WBC 10.3 10.0  HGB 14.1 13.0  HCT 44.4 41.7  MCV 95.1 95.0  PLT 257 249   Cardiac Enzymes: No results for input(s): CKTOTAL, CKMB, CKMBINDEX, TROPONINI in the last 168 hours. BNP: Invalid input(s): POCBNP CBG: Recent Labs  Lab 05/30/17 1900 05/30/17 2201 05/31/17 0729 05/31/17 1105  GLUCAP 98 138* 135* 161*   D-Dimer No results for input(s): DDIMER in the last 72 hours. Hgb A1c Recent Labs    05/30/17 1039  HGBA1C 5.8*   Lipid Profile No results for input(s): CHOL, HDL, LDLCALC, TRIG, CHOLHDL, LDLDIRECT in the last 72 hours. Thyroid function studies Recent Labs    05/30/17 1859  TSH 0.149*   Anemia work up No results for input(s): VITAMINB12, FOLATE, FERRITIN, TIBC, IRON, RETICCTPCT in the last 72 hours. Microbiology No results found for this or any previous visit (from the past 240 hour(s)).   Discharge Instructions:   Discharge Instructions    Diet - low sodium heart healthy   Complete by:  As directed    Diet Carb Modified   Complete by:  As directed    Discharge instructions   Complete by:  As directed    Call psychiatrist and get appointment to discuss lithium ASAP PCP to refer to sleep study   Increase activity slowly   Complete by:  As directed      Allergies as of 05/31/2017   No Known Allergies     Medication List    STOP taking these medications   ARIPiprazole 30 MG tablet Commonly known as:  ABILIFY   lithium carbonate 300 MG capsule   sitaGLIPtin 100 MG tablet Commonly known as:  JANUVIA     TAKE these medications   aspirin 81 MG chewable tablet Chew 81 mg by mouth daily.   BELSOMRA 20 MG Tabs Generic drug:   Suvorexant Take 20 mg by mouth at bedtime.   cyanocobalamin 1000 MCG tablet Take 1 tablet (1,000 mcg total) by mouth daily.   fenofibrate 160 MG tablet Take 1 tablet (160 mg total) by mouth daily with breakfast.   glipiZIDE 10 MG tablet Commonly known as:  GLUCOTROL Take 1 tablet (10 mg total) by mouth 2 (two) times daily before a meal.   lamoTRIgine 100 MG tablet Commonly known as:  LAMICTAL Take 200 mg by mouth 2 (two) times daily.   levothyroxine 200 MCG tablet Commonly known as:  SYNTHROID, LEVOTHROID Take 1 tablet (200 mcg total) by mouth daily before breakfast. Start taking on:  06/01/2017 What changed:    medication strength  how much to take   LORazepam 0.5 MG tablet Commonly known as:  ATIVAN Take 0.5 mg by mouth 4 (four) times daily as needed.      Follow-up Information  Jerl Mina, MD Follow up in 1 week(s).   Specialty:  Family Medicine Contact information: [address] [place] [address] [phone]        ASAP appointment with psychiatry Follow up.            Time coordinating discharge: 35 min  Signed:  Joseph Art   [facility] 05/31/2017, 2:26 PM

## 2017-05-31 NOTE — Consult Note (Signed)
[place] Psychiatry Consult   Reason for Consult:  Lithium Toxicity Referring Physician:  Dr. Eliseo Squires  Patient Identification: Julie Morrow MRN:  [id number] Principal Diagnosis: Bradycardia Diagnosis:   Patient Active Problem List   Diagnosis Date Noted  . Acute kidney injury ([place]) [N17.9] 05/30/2017  . Bradycardia [R00.1]   . Lithium toxicity [T56.891A]   . COPD GOLD 0 / still smoking  [J44.9] 12/22/2016  . Generalized weakness [R53.1] 10/27/2015  . GERD (gastroesophageal reflux disease) [K21.9] 10/15/2015  . Bipolar 1 disorder, mixed, severe ([place]) [F31.63] 10/11/2015  . Diabetes type 2, controlled ([place]) [E11.9] 07/07/2015  . Morbid (severe) obesity due to excess calories ([place]) [E66.01] 07/07/2015  . Cigarette smoker [F17.210] 07/07/2015  . Hypothyroidism [E03.9] 07/07/2015  . HLD (hyperlipidemia) [E78.5] 11/07/2014  . Psoriasis [L40.9] 11/07/2014    Total Time spent with patient: 40 minutes  Subjective:   Julie Morrow is a 67 y.o. female that lives alone, but has close support. She reports that she was started on Lithium 600 mg about 3-4 weeks ago and then they did a blood test and her Lithium level was low so the does was increased to 900 mg QHS. After 2-3 days of Lithium 900 mg QHS, she became lethargic, stumbbling, and fell. She reports that her sisters contacted her dr on Friday but no one contacted them back so they kept her at home until yesterday and then brought her to the ED.    HPI:   67 y.o. female with a Past Medical History of hypothyroidism; DM; and bipolar DO who presents with dizziness and a fall.  On further discussion, her Lithium was increased last Tuesday or Wednesday from 2 to 3 tablets.  Thursday, she developed symptoms of dizziness.  Her symptoms have progressed and today she had slurred speech as well.  In the ER she was found to have bradycardia into the 30-40 range; this is already improved to the 60s.  She is currently comfortable and no longer  dizzy.  Exam is unremarkable other than patient being disheveled and making poor eye contact.  She is quite pleasant and appears to understand the issues.  Will hold Lithium and continue to monitor levels, but anticipate ongoing resolution of the toxicity overnight.    Past Psychiatric History: Current psychiatrist at [facility], Bipolar I, MDD, long history of mental health, but minimal details  Risk to Self: Is patient at risk for suicide?: No Risk to Others:   Prior Inpatient Therapy:   Prior Outpatient Therapy:    Past Medical History:  Past Medical History:  Diagnosis Date  . Anxiety   . Bipolar 1 disorder ([place])   . Bradycardia   . Depression   . Diabetes mellitus, type II ([place])    Patient takes Glucotrol and Januvia  . Hypothyroidism   . Lithium toxicity     Past Surgical History:  Procedure Laterality Date  . ABDOMINAL HYSTERECTOMY    . CHOLECYSTECTOMY     Family History:  Family History  Problem Relation Age of Onset  . Dementia Mother   . Arthritis Father    Family Psychiatric  History: As above Social History:  Social History   Substance and Sexual Activity  Alcohol Use No  . Alcohol/week: 0.0 oz     Social History   Substance and Sexual Activity  Drug Use No    Social History   Socioeconomic History  . Marital status: Single    Spouse name: None  . Number of children: None  .  Years of education: None  . Highest education level: None  Social Needs  . Financial resource strain: None  . Food insecurity - worry: None  . Food insecurity - inability: None  . Transportation needs - medical: None  . Transportation needs - non-medical: None  Occupational History  . None  Tobacco Use  . Smoking status: Former Smoker    Packs/day: 1.00    Years: 20.00    Pack years: 20.00    Types: Cigarettes    Start date: 11/06/1984    Last attempt to quit: 05/30/2017  . Smokeless tobacco: Never Used  Substance and Sexual Activity  . Alcohol use: No     Alcohol/week: 0.0 oz  . Drug use: No  . Sexual activity: No  Other Topics Concern  . None  Social History Narrative  . None   Additional Social History:    Allergies:  No Known Allergies  Labs:  Results for orders placed or performed during the hospital encounter of 05/30/17 (from the past 48 hour(s))  Basic metabolic panel     Status: Abnormal   Collection Time: 05/30/17 10:39 AM  Result Value Ref Range   Sodium 139 135 - 145 mmol/L   Potassium 4.5 3.5 - 5.1 mmol/L   Chloride 108 101 - 111 mmol/L   CO2 21 (L) 22 - 32 mmol/L   Glucose, Bld 128 (H) 65 - 99 mg/dL   BUN 15 6 - 20 mg/dL   Creatinine, Ser 1.32 (H) 0.44 - 1.00 mg/dL   Calcium 9.6 8.9 - 10.3 mg/dL   GFR calc non Af Amer 41 (L) >60 mL/min   GFR calc Af Amer 48 (L) >60 mL/min    Comment: (NOTE) The eGFR has been calculated using the CKD EPI equation. This calculation has not been validated in all clinical situations. eGFR's persistently <60 mL/min signify possible Chronic Kidney Disease.    Anion gap 10 5 - 15  CBC     Status: Abnormal   Collection Time: 05/30/17 10:39 AM  Result Value Ref Range   WBC 10.3 4.0 - 10.5 K/uL   RBC 4.67 3.87 - 5.11 MIL/uL   Hemoglobin 14.1 12.0 - 15.0 g/dL   HCT 44.4 36.0 - 46.0 %   MCV 95.1 78.0 - 100.0 fL   MCH 30.2 26.0 - 34.0 pg   MCHC 31.8 30.0 - 36.0 g/dL   RDW 16.4 (H) 11.5 - 15.5 %   Platelets 257 150 - 400 K/uL  Lithium level     Status: Abnormal   Collection Time: 05/30/17 10:39 AM  Result Value Ref Range   Lithium Lvl 3.22 (HH) 0.60 - 1.20 mmol/L    Comment: CRITICAL RESULT CALLED TO, READ BACK BY AND VERIFIED WITH: C.VANHOOSE,RN 1238 05/30/17 CLARK,S   Hemoglobin A1c     Status: Abnormal   Collection Time: 05/30/17 10:39 AM  Result Value Ref Range   Hgb A1c MFr Bld 5.8 (H) 4.8 - 5.6 %    Comment: (NOTE) Pre diabetes:          5.7%-6.4% Diabetes:              >6.4% Glycemic control for   <7.0% adults with diabetes    Mean Plasma Glucose 119.76 mg/dL   I-stat troponin, ED     Status: None   Collection Time: 05/30/17 11:28 AM  Result Value Ref Range   Troponin i, poc 0.01 0.00 - 0.08 ng/mL   Comment 3  Comment: Due to the release kinetics of cTnI, a negative result within the first hours of the onset of symptoms does not rule out myocardial infarction with certainty. If myocardial infarction is still suspected, repeat the test at appropriate intervals.   Urinalysis, Routine w reflex microscopic     Status: Abnormal   Collection Time: 05/30/17  5:04 PM  Result Value Ref Range   Color, Urine YELLOW YELLOW   APPearance HAZY (A) CLEAR   Specific Gravity, Urine 1.018 1.005 - 1.030   pH 5.0 5.0 - 8.0   Glucose, UA NEGATIVE NEGATIVE mg/dL   Hgb urine dipstick NEGATIVE NEGATIVE   Bilirubin Urine NEGATIVE NEGATIVE   Ketones, ur NEGATIVE NEGATIVE mg/dL   Protein, ur NEGATIVE NEGATIVE mg/dL   Nitrite POSITIVE (A) NEGATIVE   Leukocytes, UA NEGATIVE NEGATIVE   RBC / HPF 0-5 0 - 5 RBC/hpf   WBC, UA 6-30 0 - 5 WBC/hpf   Bacteria, UA MANY (A) NONE SEEN   Squamous Epithelial / LPF 0-5 (A) NONE SEEN   Mucus PRESENT    Hyaline Casts, UA PRESENT   TSH     Status: Abnormal   Collection Time: 05/30/17  6:59 PM  Result Value Ref Range   TSH 0.149 (L) 0.350 - 4.500 uIU/mL    Comment: Performed by a 3rd Generation assay with a functional sensitivity of <=0.01 uIU/mL.  Lithium level     Status: None   Collection Time: 05/30/17  6:59 PM  Result Value Ref Range   Lithium Lvl 0.86 0.60 - 1.20 mmol/L  Glucose, capillary     Status: None   Collection Time: 05/30/17  7:00 PM  Result Value Ref Range   Glucose-Capillary 98 65 - 99 mg/dL  Glucose, capillary     Status: Abnormal   Collection Time: 05/30/17 10:01 PM  Result Value Ref Range   Glucose-Capillary 138 (H) 65 - 99 mg/dL  Basic metabolic panel     Status: Abnormal   Collection Time: 05/31/17  4:49 AM  Result Value Ref Range   Sodium 138 135 - 145 mmol/L   Potassium 4.0 3.5 -  5.1 mmol/L   Chloride 107 101 - 111 mmol/L   CO2 20 (L) 22 - 32 mmol/L   Glucose, Bld 119 (H) 65 - 99 mg/dL   BUN 15 6 - 20 mg/dL   Creatinine, Ser 1.32 (H) 0.44 - 1.00 mg/dL   Calcium 9.0 8.9 - 10.3 mg/dL   GFR calc non Af Amer 41 (L) >60 mL/min   GFR calc Af Amer 48 (L) >60 mL/min    Comment: (NOTE) The eGFR has been calculated using the CKD EPI equation. This calculation has not been validated in all clinical situations. eGFR's persistently <60 mL/min signify possible Chronic Kidney Disease.    Anion gap 11 5 - 15  CBC     Status: Abnormal   Collection Time: 05/31/17  4:49 AM  Result Value Ref Range   WBC 10.0 4.0 - 10.5 K/uL   RBC 4.39 3.87 - 5.11 MIL/uL   Hemoglobin 13.0 12.0 - 15.0 g/dL   HCT 41.7 36.0 - 46.0 %   MCV 95.0 78.0 - 100.0 fL   MCH 29.6 26.0 - 34.0 pg   MCHC 31.2 30.0 - 36.0 g/dL   RDW 16.5 (H) 11.5 - 15.5 %   Platelets 249 150 - 400 K/uL  Lithium level     Status: None   Collection Time: 05/31/17  4:49 AM  Result Value Ref Range     Lithium Lvl 0.75 0.60 - 1.20 mmol/L  Glucose, capillary     Status: Abnormal   Collection Time: 05/31/17  7:29 AM  Result Value Ref Range   Glucose-Capillary 135 (H) 65 - 99 mg/dL  Glucose, capillary     Status: Abnormal   Collection Time: 05/31/17 11:05 AM  Result Value Ref Range   Glucose-Capillary 161 (H) 65 - 99 mg/dL    Current Facility-Administered Medications  Medication Dose Route Frequency Provider Last Rate Last Dose  . 0.9 %  sodium chloride infusion   Intravenous Continuous Radene Gunning, NP 100 mL/hr at 05/30/17 2132    . acetaminophen (TYLENOL) tablet 650 mg  650 mg Oral Q6H PRN Radene Gunning, NP   650 mg at 05/31/17 0234   Or  . acetaminophen (TYLENOL) suppository 650 mg  650 mg Rectal Q6H PRN Radene Gunning, NP      . aspirin chewable tablet 81 mg  81 mg Oral Daily Radene Gunning, NP   81 mg at 05/31/17 0819  . enoxaparin (LOVENOX) injection 40 mg  40 mg Subcutaneous Q24H Radene Gunning, NP   40 mg at  05/30/17 1917  . fenofibrate tablet 160 mg  160 mg Oral Q breakfast Radene Gunning, NP   160 mg at 05/31/17 0819  . [START ON 06/01/2017] Influenza vac split quadrivalent PF (FLUZONE HIGH-DOSE) injection 0.5 mL  0.5 mL Intramuscular Tomorrow-1000 Vann, Jessica U, DO      . insulin aspart (novoLOG) injection 0-5 Units  0-5 Units Subcutaneous QHS Black, Karen M, NP      . insulin aspart (novoLOG) injection 0-9 Units  0-9 Units Subcutaneous TID WC Radene Gunning, NP   3 Units at 05/31/17 1257  . lamoTRIgine (LAMICTAL) tablet 200 mg  200 mg Oral BID Radene Gunning, NP   200 mg at 05/31/17 0819  . [START ON 06/01/2017] levothyroxine (SYNTHROID, LEVOTHROID) tablet 200 mcg  200 mcg Oral QAC breakfast Vann, Jessica U, DO      . LORazepam (ATIVAN) tablet 0.5 mg  0.5 mg Oral QID PRN Radene Gunning, NP      . ondansetron [facility]) tablet 4 mg  4 mg Oral Q6H PRN Radene Gunning, NP       Or  . ondansetron [facility]) injection 4 mg  4 mg Intravenous Q6H PRN Black, Karen M, NP      . senna-docusate (Senokot-S) tablet 1 tablet  1 tablet Oral QHS PRN Black, Lezlie Octave, NP        Musculoskeletal: Strength & Muscle Tone: within normal limits Gait & Station: unsteady Patient leans: N/A  Psychiatric Specialty Exam: Physical Exam  ROS  Blood pressure (!) 145/75, pulse (!) 42, temperature 97.8 F (36.6 C), temperature source Axillary, resp. rate 20, height 5' 3" (1.6 m), weight 120.3 kg (265 lb 3.2 oz), SpO2 95 %.Body mass index is 46.98 kg/m.  General Appearance: Disheveled  Eye Contact:  Good  Speech:  Clear and Coherent and Normal Rate  Volume:  Normal  Mood:  Euthymic  Affect:  Flat  Thought Process:  Goal Directed and Descriptions of Associations: Intact   Orientation:  Full (Time, Place, and Person)  Thought Content:  WDL  Suicidal Thoughts:  No  Homicidal Thoughts:  No  Memory:  Immediate;   Good Recent;   Good Remote;   Good  Judgement:  Fair  Insight:  Good  Psychomotor Activity:  Normal   Concentration:  Concentration: Good and Attention  Span: Good  Recall:  Good  Fund of Knowledge:  Good  Language:  Good  Akathisia:  No  Handed:  Right  AIMS (if indicated):     Assets:  Communication Skills Desire for Improvement Financial Resources/Insurance Housing Physical Health Social Support Transportation  ADL's:  Intact  Cognition:  WNL  Sleep:        Treatment Plan Summary: lPatinet was offered Eskalith and refused. She states that she would prefer to follow up with her psychiatrist about starting new medications. Patient was poor historian about her Abilify mentioned in her home medications list. I attempted to contact [facility], but they had no inforamtion due to being unable to locate the patient's chart. Patient will remain on the Lamictal and agrees to follow up ASAP. Patient denies any SI/HI/AVH and contracts for safety. Her sisters are in the room with her and they also state no concerns for her safety at this time.   Disposition: No evidence of imminent risk to self or others at present.   Patient does not meet criteria for psychiatric inpatient admission. Supportive therapy provided about ongoing stressors. Discussed crisis plan, support from social network, calling 911, coming to the Emergency Department, and calling Suicide Hotline.   B , FNP 05/31/2017 3:14 PM 

## 2017-07-19 IMAGING — CT CT HEAD W/O CM
3 series · 15 of 47 positions shown, 18 images · non-contrast
Comparison: None.

CLINICAL DATA: 64-year-old female with weakness for 3 weeks.

EXAM:
CT HEAD WITHOUT CONTRAST
TECHNIQUE: Contiguous axial images were obtained from the base of the skull
through the vertex without intravenous contrast.

[Series 2: head wo · axial · 0.42mm/px · z∈[+406,+536]mm · 9 of 32 slices shown, 12 images]
[im 3/32  brain]
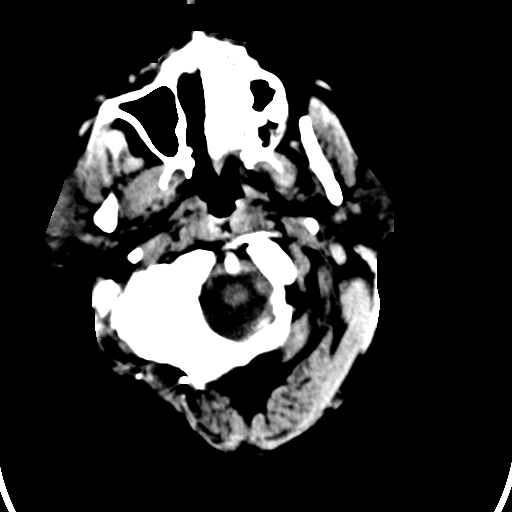
[im 3/32  bone]
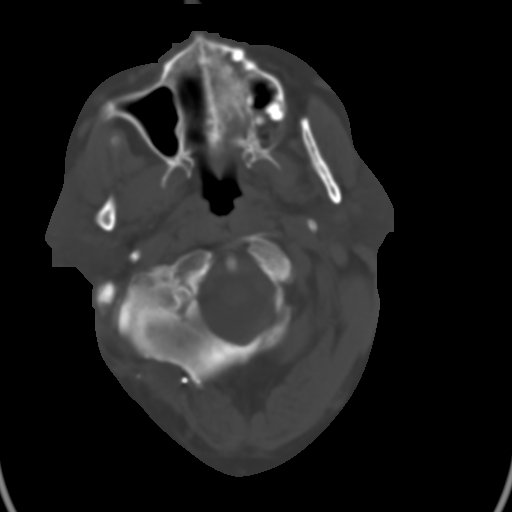
[im 6/32  brain]
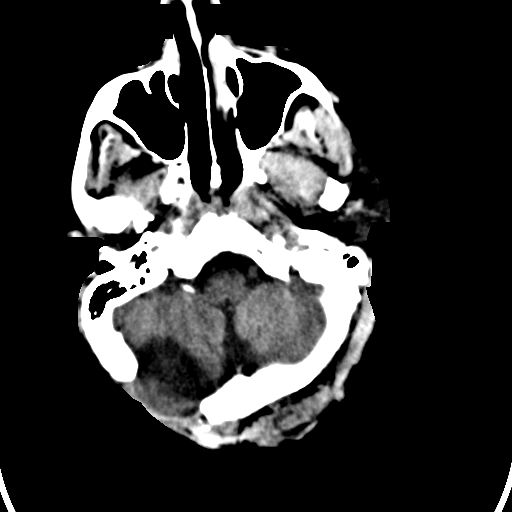
[im 9/32  brain]
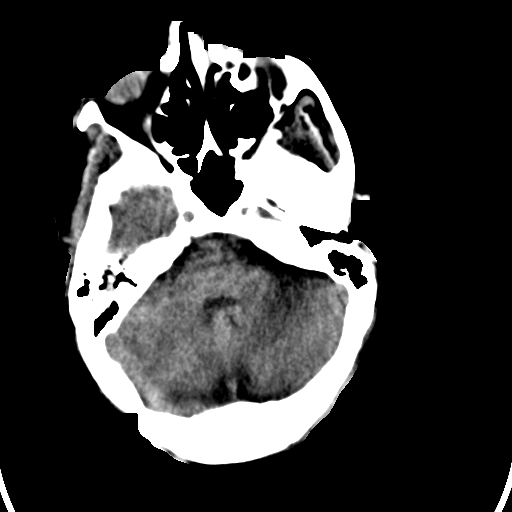
[im 12/32  brain]
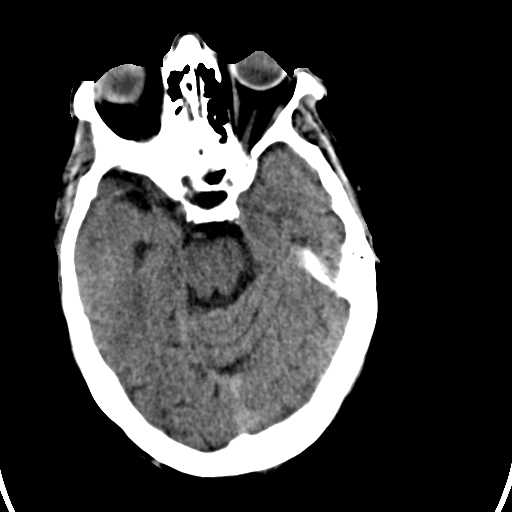
[im 17/32  brain]
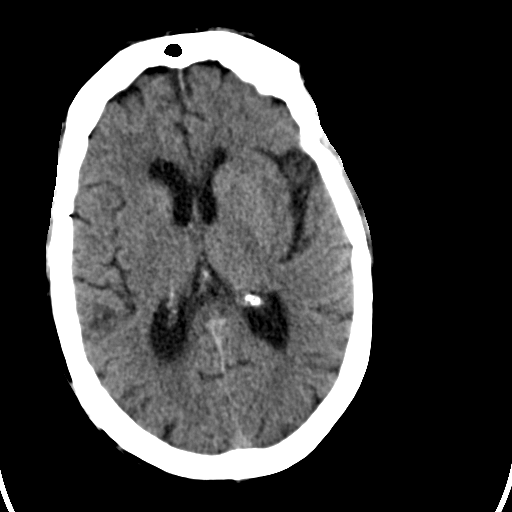
[im 17/32  bone]
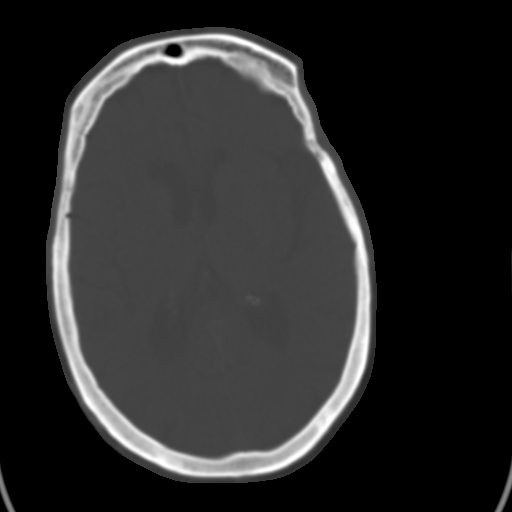
[im 20/32  brain]
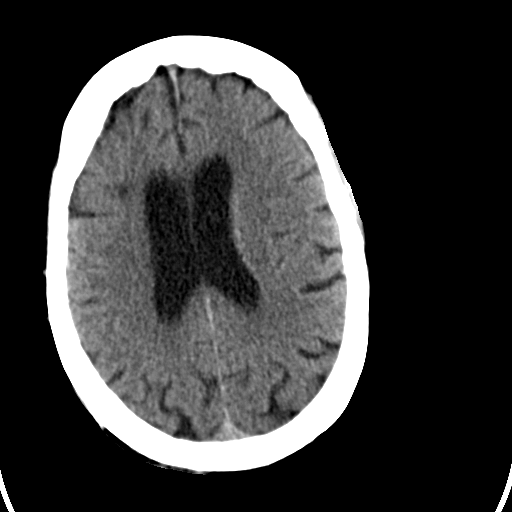
[im 23/32  brain]
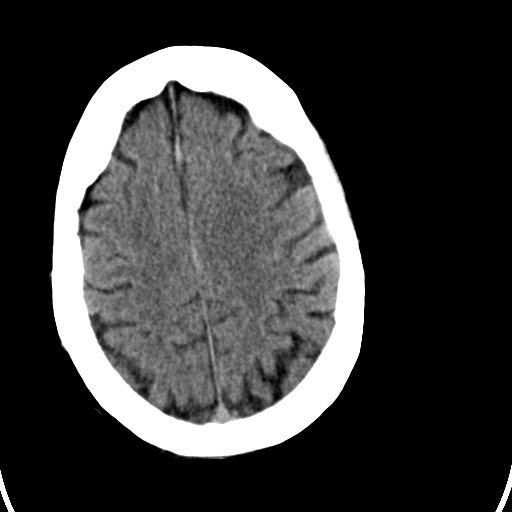
[im 26/32  brain]
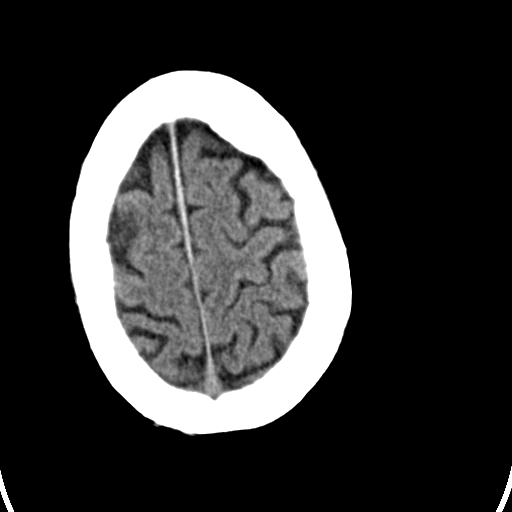
[im 29/32  brain]
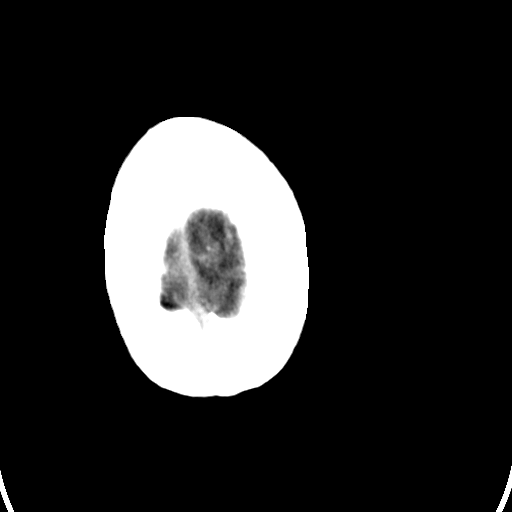
[im 29/32  bone]
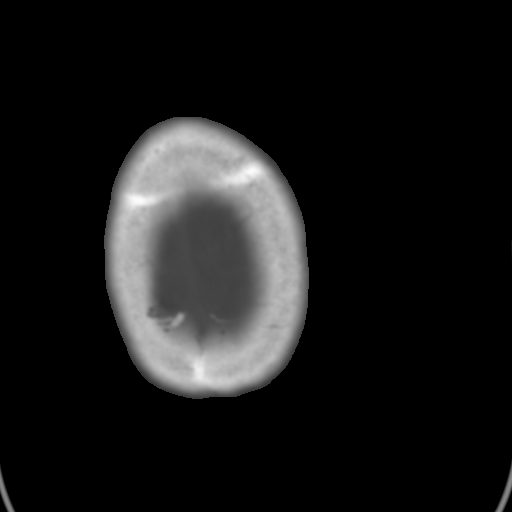

[Series 4: coronal soft tissue · coronal · 0.33mm/px · 3 of 69 slices shown]
[im 23/69  brain]
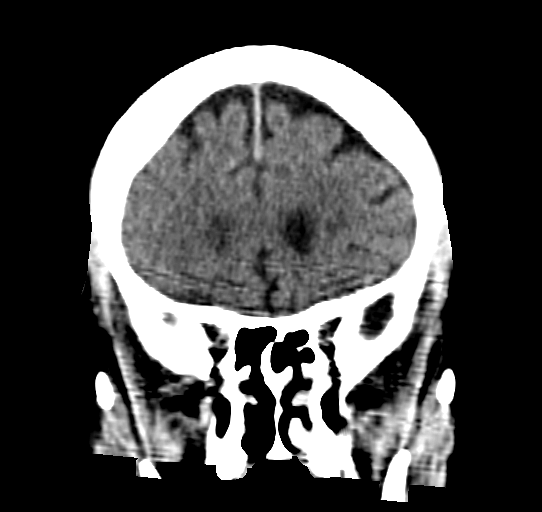
[im 31/69  brain]
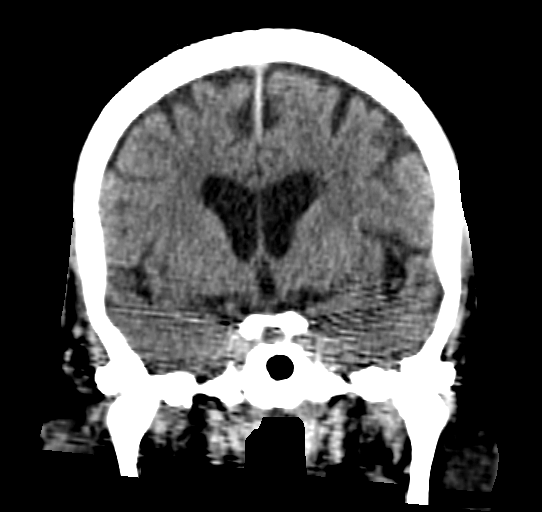
[im 38/69  brain]
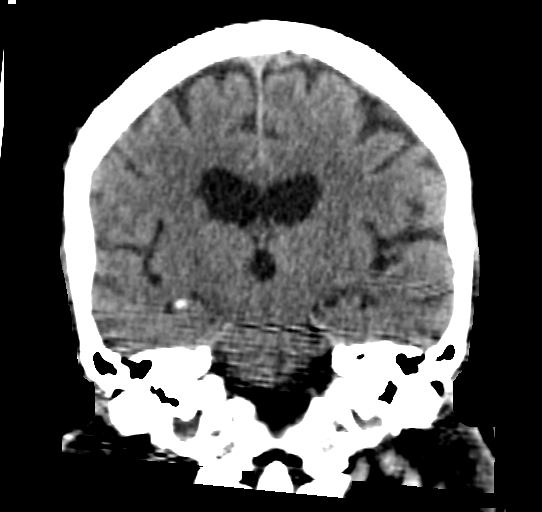

[Series 5: sagittal soft tissue · sagittal · 0.33mm/px · 3 of 50 slices shown]
[im 17/50  brain]
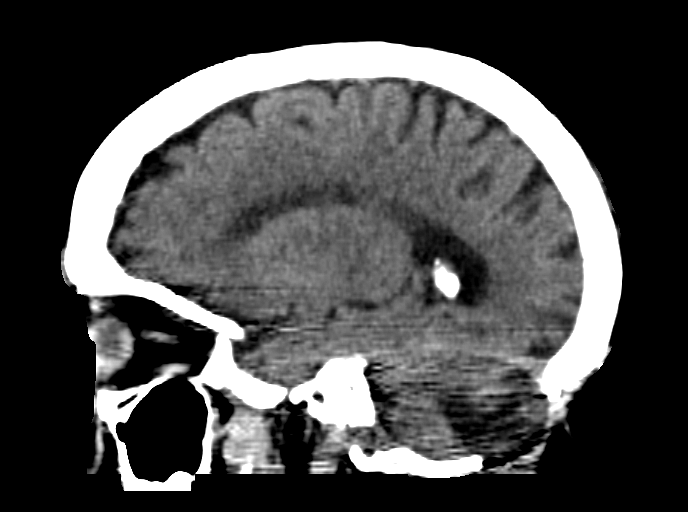
[im 25/50  brain]
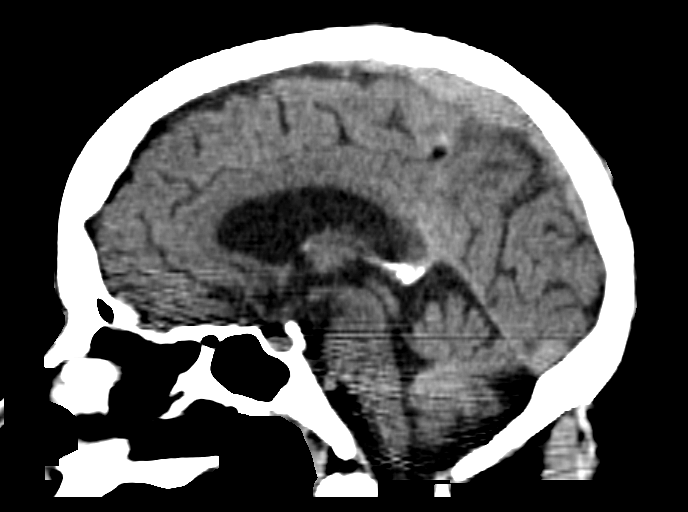
[im 33/50  brain]
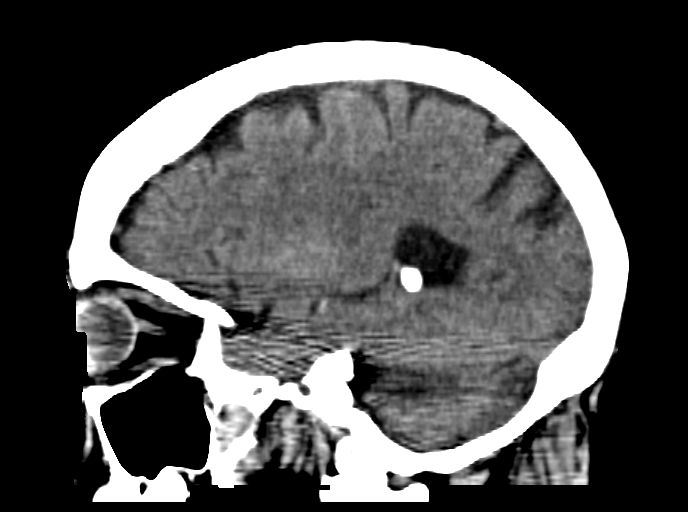

[15 of 47 positions shown; findings below may reference images not displayed]

FINDINGS: There is postsurgical changes of right suboccipital craniectomy.
There is partial resection of the right cerebellar hemisphere. A
x 2.1 cm area of low-attenuation at the operative bed may represent
complex fluid or packing material. Correlation with surgical history
recommended. The ventricles and sulci are appropriate in size for
patient's age. Minimal periventricular and deep white matter chronic
microvascular ischemic changes noted. Small high attenuating
internal in the right cerebellar hemisphere superior to the
operative bed represent small hemorrhage. There is mild thickened
appearance of the anterior falx measuring 3 mm in thickness. This
may represent chronic falx thickening versus minimal parafalcine
subdural hemorrhage. Close follow-up recommended. There is no
intraventricular hemorrhage. No midline shift noted.

The visualized paranasal sinuses and mastoid air cells are clear.
IMPRESSION: Right suboccipital craniectomy and postsurgical changes of right
cerebellar hemisphere. Focal area of high attenuating superior to
the operative bed is concerning for small hemorrhage.

Thickening of the anterior falx versus minimal parafalcine subdural
hemorrhage. Direct comparison with prior CT scan if available, and
Follow-up recommended.

These results were called by telephone at the time of interpretation
on 10/27/2015 at [DATE] to Dr. ZAYDA SATISH , who verbally
acknowledged these results.

## 2017-11-03 DIAGNOSIS — N760 Acute vaginitis: Secondary | ICD-10-CM | POA: Diagnosis not present

## 2017-11-03 DIAGNOSIS — N762 Acute vulvitis: Secondary | ICD-10-CM | POA: Diagnosis not present

## 2017-11-24 DIAGNOSIS — H524 Presbyopia: Secondary | ICD-10-CM | POA: Diagnosis not present

## 2017-11-24 DIAGNOSIS — H52203 Unspecified astigmatism, bilateral: Secondary | ICD-10-CM | POA: Diagnosis not present

## 2017-11-24 DIAGNOSIS — E119 Type 2 diabetes mellitus without complications: Secondary | ICD-10-CM | POA: Diagnosis not present

## 2017-11-24 DIAGNOSIS — Z961 Presence of intraocular lens: Secondary | ICD-10-CM | POA: Diagnosis not present

## 2017-11-30 DIAGNOSIS — N763 Subacute and chronic vulvitis: Secondary | ICD-10-CM | POA: Diagnosis not present

## 2017-11-30 DIAGNOSIS — L900 Lichen sclerosus et atrophicus: Secondary | ICD-10-CM | POA: Diagnosis not present

## 2018-03-04 ENCOUNTER — Encounter: Payer: Self-pay | Admitting: Emergency Medicine

## 2018-03-04 DIAGNOSIS — F4321 Adjustment disorder with depressed mood: Secondary | ICD-10-CM | POA: Insufficient documentation

## 2018-03-04 DIAGNOSIS — F411 Generalized anxiety disorder: Secondary | ICD-10-CM | POA: Insufficient documentation

## 2018-03-04 DIAGNOSIS — F319 Bipolar disorder, unspecified: Secondary | ICD-10-CM | POA: Insufficient documentation

## 2018-03-04 DIAGNOSIS — G4700 Insomnia, unspecified: Secondary | ICD-10-CM

## 2018-03-15 ENCOUNTER — Encounter: Payer: Self-pay | Admitting: Physician Assistant

## 2018-03-15 ENCOUNTER — Ambulatory Visit: Payer: Medicare Other | Admitting: Physician Assistant

## 2018-03-15 DIAGNOSIS — G4700 Insomnia, unspecified: Secondary | ICD-10-CM

## 2018-03-15 DIAGNOSIS — F319 Bipolar disorder, unspecified: Secondary | ICD-10-CM | POA: Diagnosis not present

## 2018-03-15 DIAGNOSIS — F411 Generalized anxiety disorder: Secondary | ICD-10-CM

## 2018-03-15 MED ORDER — ARIPIPRAZOLE 2 MG PO TABS: 2.0000 mg | ORAL_TABLET | Freq: Every day | ORAL | 0 refills | Status: DC

## 2018-03-15 MED ORDER — BUPROPION HCL ER (XL) 150 MG PO TB24: ORAL_TABLET | ORAL | 1 refills | Status: DC

## 2018-03-15 MED ORDER — ALPRAZOLAM 0.5 MG PO TABS: 0.5000 mg | ORAL_TABLET | Freq: Three times a day (TID) | ORAL | 2 refills | Status: DC | PRN

## 2018-03-15 NOTE — Patient Instructions (Signed)
On the Abilify 2 mg, take one every morning for 10 days and then stop.  Increasing Wellbutrin XL to a total of 450mg .  Take 3 of the 150mg  that I'm prescribing today.  Continue everything else.
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
## 2018-03-16 ENCOUNTER — Other Ambulatory Visit: Payer: Self-pay | Admitting: Physician Assistant

## 2018-03-16 MED ORDER — BUPROPION HCL ER (XL) 150 MG PO TB24: 450.0000 mg | ORAL_TABLET | Freq: Every day | ORAL | 0 refills | Status: DC

## 2018-04-10 ENCOUNTER — Other Ambulatory Visit: Payer: Self-pay | Admitting: Physician Assistant

## 2018-04-11 ENCOUNTER — Encounter: Payer: Self-pay | Admitting: Physician Assistant

## 2018-04-11 ENCOUNTER — Ambulatory Visit: Payer: Medicare Other | Admitting: Physician Assistant

## 2018-04-11 DIAGNOSIS — G4700 Insomnia, unspecified: Secondary | ICD-10-CM

## 2018-04-11 DIAGNOSIS — F411 Generalized anxiety disorder: Secondary | ICD-10-CM

## 2018-04-11 DIAGNOSIS — F319 Bipolar disorder, unspecified: Secondary | ICD-10-CM | POA: Diagnosis not present

## 2018-04-11 MED ORDER — CITALOPRAM HYDROBROMIDE 10 MG PO TABS: 10.0000 mg | ORAL_TABLET | Freq: Every day | ORAL | 1 refills | Status: DC

## 2018-04-11 NOTE — Patient Instructions (Signed)
On the Wellbutrin XL 150mg , take 2 pills daily for 10 days, then 1 pill daily for 10 days, then stop

## 2018-04-11 NOTE — Progress Notes (Signed)
[facility]  Patient ID: Julie Morrow,  MRN: [id number]  PCP: Patient, No Pcp Per  Date of Evaluation: 04/11/2018 Time spent:25 minutes  Chief Complaint:  Chief Complaint    Depression; Follow-up      HISTORY/CURRENT STATUS: HPI Here for 1 month med check.  Son [name] is with her.   "I still feel depressed.  Some days I don't feel like going on.  I'm not having suicidal thoughts." Doesn't cry easily. Doesn't stay in bed a lot.  Sleeps about 5 hours per night.  Wakes up often. But feels rested. Energy and motivation are good about 1/2 the time. "I don't feel like the medicine is helping at all."   Anxiety and stress are still there. Has a court date in Dec to deal with issues with her brother.  Feels like the meds are fighting against each other. Feels 'torn,' like they're counteracting each other. "it's been going on for a long time".  It has not just started since we decreased and got her off of the Abilify.  She has wanted to try Celexa because she is heard it is a good drug.  Patient denies increased energy with decreased need for sleep, no increased talkativeness, no racing thoughts, no impulsivity or risky behaviors, no increased spending, no increased libido, no grandiosity.  Individual Medical History/ Review of Systems: Changes? :No    Past medications for mental health diagnoses include: Trilafon, Lamictal, Ativan, Abilify, gabapentin, Remeron, Silenor, Belsomra, Seroquel, Ambien, Zoloft, Vraylar, trazodone  Allergies: Patient has no known allergies.  Current Medications:  Current Outpatient Medications:  .  aspirin 81 MG chewable tablet, Chew 81 mg by mouth daily., Disp: , Rfl:  .  buPROPion (WELLBUTRIN XL) 150 MG 24 hr tablet, 3 po qam., Disp: 90 tablet, Rfl: 1 .  fenofibrate 160 MG tablet, Take 1 tablet (160 mg total) by mouth daily with breakfast., Disp: 30 tablet, Rfl: 0 .  glipiZIDE (GLUCOTROL) 10 MG tablet, Take 1 tablet (10 mg total) by mouth 2  (two) times daily before a meal., Disp: 60 tablet, Rfl: 0 .  levothyroxine (SYNTHROID, LEVOTHROID) 200 MCG tablet, Take 1 tablet (200 mcg total) by mouth daily before breakfast., Disp: 30 tablet, Rfl: 0 .  Melatonin 3-10 MG TABS, Take 3 mg by mouth., Disp: , Rfl:  .  multivitamin-iron-minerals-folic acid (CENTRUM) chewable tablet, Chew 1 tablet by mouth daily., Disp: , Rfl:  .  Oxcarbazepine (TRILEPTAL) 300 MG tablet, TAKE 1 TABLET BY MOUTH EVERY AT BEDTIME, Disp: 90 tablet, Rfl: 0 .  Suvorexant (BELSOMRA) 20 MG TABS, Take 20 mg by mouth at bedtime., Disp: , Rfl:  .  ALPRAZolam (XANAX) 0.5 MG tablet, Take 1 tablet (0.5 mg total) by mouth 3 (three) times daily as needed for anxiety., Disp: 75 tablet, Rfl: 2 .  citalopram (CELEXA) 10 MG tablet, Take 1 tablet (10 mg total) by mouth daily., Disp: 30 tablet, Rfl: 1 .  diphenhydrAMINE (BENADRYL) 25 mg capsule, Take 25 mg by mouth at bedtime as needed for sleep., Disp: , Rfl:  .  vitamin B-12 1000 MCG tablet, Take 1 tablet (1,000 mcg total) by mouth daily. (Patient not taking: Reported on 05/30/2017), Disp: , Rfl:    Medication Side Effects: none  Family Medical/ Social History: Changes? No  MENTAL HEALTH EXAM:  There were no vitals taken for this visit.There is no height or weight on file to calculate BMI.  General Appearance: Casual and Obese  Eye Contact:  Good  Speech:  Clear and Coherent  Volume:  Normal  Mood:  Euthymic  Affect:  Appropriate  Thought Process:  Goal Directed  Orientation:  Full (Time, Place, and Person)  Thought Content: Logical   Suicidal Thoughts:  No  Homicidal Thoughts:  No  Memory:  WNL  Judgement:  Good  Insight:  Good  Psychomotor Activity:  Normal  Concentration:  Concentration: Good  Recall:  Good  Fund of Knowledge: Good  Language: Good  Assets:  Desire for Improvement  ADL's:  Intact  Cognition: WNL  Prognosis:  Fair    DIAGNOSES:    ICD-10-CM   1. Bipolar I disorder (HCC) F31.9   2. Insomnia,  unspecified type G47.00   3. Generalized anxiety disorder F41.1     Receiving Psychotherapy: No    RECOMMENDATIONS: I spent 25 minutes with her and her son.  50% of that time was spent in counseling in reference to different medications, their benefits, risks, side effects. We will wean off of the Wellbutrin as it does not seem to be helpful at all.  Instructions were written on the AVS.  Basically she will do the XL 300 mg daily for 10 days and then 150 mg daily for 10 days and then stop. She is only ever used Zoloft as far as an SSRI goes.  We will start Celexa as noted above.  She and her son know to watch for manic signs such as increased talkativeness, decreased need for sleep, increased spending, etc. Continue Trileptal 300 mg nightly. Continue Xanax 0.5 mg twice daily as needed. Continue Belsomra 20 mg 1 nightly as needed Return in 4 to 6 weeks.   Melony Overlyeresa , PA-C

## 2018-05-23 ENCOUNTER — Encounter: Payer: Self-pay | Admitting: Physician Assistant

## 2018-05-23 ENCOUNTER — Ambulatory Visit: Payer: Medicare Other | Admitting: Physician Assistant

## 2018-05-23 DIAGNOSIS — F411 Generalized anxiety disorder: Secondary | ICD-10-CM | POA: Diagnosis not present

## 2018-05-23 DIAGNOSIS — F319 Bipolar disorder, unspecified: Secondary | ICD-10-CM

## 2018-05-23 DIAGNOSIS — E119 Type 2 diabetes mellitus without complications: Secondary | ICD-10-CM

## 2018-05-23 DIAGNOSIS — G4700 Insomnia, unspecified: Secondary | ICD-10-CM

## 2018-05-23 MED ORDER — TRAZODONE HCL 100 MG PO TABS: 100.0000 mg | ORAL_TABLET | Freq: Every evening | ORAL | 1 refills | Status: DC | PRN

## 2018-05-23 MED ORDER — CITALOPRAM HYDROBROMIDE 20 MG PO TABS: 20.0000 mg | ORAL_TABLET | Freq: Every day | ORAL | 1 refills | Status: DC
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
## 2018-05-24 ENCOUNTER — Encounter: Payer: Self-pay | Admitting: Physician Assistant

## 2018-06-01 ENCOUNTER — Other Ambulatory Visit: Payer: Self-pay | Admitting: Physician Assistant

## 2018-06-10 ENCOUNTER — Other Ambulatory Visit: Payer: Self-pay | Admitting: Physician Assistant

## 2018-06-10 NOTE — Telephone Encounter (Signed)
Already submitted 05/23/2018

## 2018-07-04 ENCOUNTER — Ambulatory Visit: Payer: Medicare Other | Admitting: Physician Assistant

## 2018-07-04 ENCOUNTER — Encounter: Payer: Self-pay | Admitting: Physician Assistant

## 2018-07-04 DIAGNOSIS — F319 Bipolar disorder, unspecified: Secondary | ICD-10-CM

## 2018-07-04 DIAGNOSIS — G4700 Insomnia, unspecified: Secondary | ICD-10-CM | POA: Diagnosis not present

## 2018-07-04 DIAGNOSIS — F411 Generalized anxiety disorder: Secondary | ICD-10-CM | POA: Diagnosis not present

## 2018-07-04 MED ORDER — CITALOPRAM HYDROBROMIDE 20 MG PO TABS: 30.0000 mg | ORAL_TABLET | Freq: Every day | ORAL | 1 refills | Status: DC

## 2018-07-04 MED ORDER — ALPRAZOLAM 0.5 MG PO TABS: 0.5000 mg | ORAL_TABLET | Freq: Three times a day (TID) | ORAL | 5 refills | Status: DC | PRN

## 2018-07-04 MED ORDER — SUVOREXANT 20 MG PO TABS: 20.0000 mg | ORAL_TABLET | Freq: Every evening | ORAL | 5 refills | Status: DC | PRN

## 2018-07-04 MED ORDER — OXCARBAZEPINE 300 MG PO TABS: 300.0000 mg | ORAL_TABLET | Freq: Every day | ORAL | 1 refills | Status: DC

## 2018-07-04 NOTE — Progress Notes (Signed)
[facility]  Patient ID: CACHE KNEEBONE,  MRN: [id number]  PCP: Patient, No Pcp Per  Date of Evaluation: 07/04/2018 Time spent:15 minutes  Chief Complaint:  Chief Complaint    Follow-up      HISTORY/CURRENT STATUS: HPI Here for routine med check.  Accompanied by son, [name].  "I think I'm doing a little bit better. I'm not as worried about my brother and all that stuff. We go to court on Thursday and I hope it's over with."  Has a little bit more energy and motivation.  Still likes to be alone. Not worrying as much.   Sleeps okay.  Trazodone didn't work.  She had left over Belsomra and that's working better than it had been.   Patient denies increased energy with decreased need for sleep, no increased talkativeness, no racing thoughts, no impulsivity or risky behaviors, no increased spending, no increased libido, no grandiosity.  Denies muscle or joint pain, stiffness, or dystonia.  Denies dizziness, syncope, seizures, numbness, tingling, tremor, tics, unsteady gait, slurred speech, confusion.   Individual Medical History/ Review of Systems: Changes? :No   Past medications for mental health diagnoses include: Trilafon, Lamictal, Ativan, Abilify, gabapentin, Remeron, Silenor, Belsomra, Seroquel, Ambien, Zoloft, Vraylar, trazodone, lithium, Celexa, melatonin, Trileptal, trazodone did not work  Allergies: Patient has no known allergies.  Current Medications:  Current Outpatient Medications:  .  ALPRAZolam (XANAX) 0.5 MG tablet, Take 1 tablet (0.5 mg total) by mouth 3 (three) times daily as needed for anxiety. Max 2.5mg  qd, Disp: 75 tablet, Rfl: 5 .  aspirin 81 MG chewable tablet, Chew 81 mg by mouth daily., Disp: , Rfl:  .  citalopram (CELEXA) 20 MG tablet, Take 1.5 tablets (30 mg total) by mouth daily., Disp: 135 tablet, Rfl: 1 .  fenofibrate 160 MG tablet, Take 1 tablet (160 mg total) by mouth daily with breakfast., Disp: 30 tablet, Rfl: 0 .  glipiZIDE (GLUCOTROL) 10  MG tablet, Take 1 tablet (10 mg total) by mouth 2 (two) times daily before a meal., Disp: 60 tablet, Rfl: 0 .  levothyroxine (SYNTHROID, LEVOTHROID) 200 MCG tablet, Take 1 tablet (200 mcg total) by mouth daily before breakfast., Disp: 30 tablet, Rfl: 0 .  Melatonin 3-10 MG TABS, Take 3 mg by mouth., Disp: , Rfl:  .  multivitamin-iron-minerals-folic acid (CENTRUM) chewable tablet, Chew 1 tablet by mouth daily., Disp: , Rfl:  .  Oxcarbazepine (TRILEPTAL) 300 MG tablet, Take 1 tablet (300 mg total) by mouth daily., Disp: 90 tablet, Rfl: 1 .  Suvorexant (BELSOMRA) 20 MG TABS, Take 20 mg by mouth at bedtime as needed., Disp: 30 tablet, Rfl: 5 Medication Side Effects: none  Family Medical/ Social History: Changes? No  MENTAL HEALTH EXAM:  There were no vitals taken for this visit.There is no height or weight on file to calculate BMI.  General Appearance: Casual, Well Groomed and Obese  Eye Contact:  Good  Speech:  Clear and Coherent  Volume:  Normal  Mood:  Euthymic  Affect:  Appropriate  Thought Process:  Goal Directed  Orientation:  Full (Time, Place, and Person)  Thought Content: Logical   Suicidal Thoughts:  No  Homicidal Thoughts:  No  Memory:  WNL  Judgement:  Good  Insight:  Good  Psychomotor Activity:  Normal  Concentration:  Concentration: Good  Recall:  Good  Fund of Knowledge: Good  Language: Good  Assets:  Desire for Improvement  ADL's:  Intact  Cognition: WNL  Prognosis:  Fair  DIAGNOSES:    ICD-10-CM   1. Bipolar I disorder (HCC) F31.9   2. Generalized anxiety disorder F41.1   3. Insomnia, unspecified type G47.00     Receiving Psychotherapy: No    RECOMMENDATIONS: Increase Celexa to 30 mg p.o. daily. Continue Xanax 0.5 mg 3 times daily as needed with no more than 2.5 pills/day. Continue melatonin 3 to 5 mg nightly as needed. Restart Belsomra 20 mg nightly PRN. Continue Trileptal 300 mg daily. Sleep hygiene was discussed.  Do not take naps during the day.   Amber-colored glasses were discussed.  Take the melatonin and Belsomra at the same time every day, and after about 2 weeks try to go without the Belsomra. Return in 2 months or sooner as needed. Melony Overly, PA-C

## 2018-07-07 ENCOUNTER — Other Ambulatory Visit: Payer: Self-pay | Admitting: Physician Assistant

## 2018-07-17 ENCOUNTER — Telehealth: Payer: Self-pay | Admitting: Physician Assistant

## 2018-07-17 NOTE — Telephone Encounter (Signed)
Please triage. Ask about how things went with the court hearing.  I think that happened recently, and if things went well, hopefully she's overly happy about that. Any other signs of mania?  Either way, I think we should probably observe, not make med changes right away, but that depends on what he says.

## 2018-07-17 NOTE — Telephone Encounter (Signed)
Son [name] called concerned about Ariza's state.  She seemed overly happy this morning and is afraid she may be entering a manic episode.  Please call to discuss medication.  Next appt 08/29/2018

## 2018-07-17 NOTE — Telephone Encounter (Signed)
Court date was postponed again till next month per son Julie Morrow. They increased her celexa to 30mg  on Saturday. Feels she is really happy, but she is sleeping well so that's good for her. No other symptoms at this time. Instructed to continue to monitor if any changes to call back.

## 2018-08-29 ENCOUNTER — Encounter: Payer: Self-pay | Admitting: Physician Assistant

## 2018-08-29 ENCOUNTER — Other Ambulatory Visit: Payer: Self-pay

## 2018-08-29 ENCOUNTER — Ambulatory Visit (INDEPENDENT_AMBULATORY_CARE_PROVIDER_SITE_OTHER): Payer: Medicare Other | Admitting: Physician Assistant

## 2018-08-29 DIAGNOSIS — F411 Generalized anxiety disorder: Secondary | ICD-10-CM

## 2018-08-29 DIAGNOSIS — F319 Bipolar disorder, unspecified: Secondary | ICD-10-CM | POA: Diagnosis not present

## 2018-08-29 DIAGNOSIS — G4700 Insomnia, unspecified: Secondary | ICD-10-CM | POA: Diagnosis not present

## 2018-08-29 NOTE — Progress Notes (Signed)
[facility] Check  Patient ID: MELENIE TORIO,  MRN: [id number]  PCP: Patient, No Pcp Per  Date of Evaluation: 08/29/2018 Time spent:15 minutes  Chief Complaint:  Chief Complaint    Follow-up     Virtual Visit via Telephone Note  I connected with [name] on 08/29/18 at  9:00 AM EDT by telephone and verified that I am speaking with the correct person using two identifiers.   I discussed the limitations, risks, security and privacy concerns of performing an evaluation and management service by telephone and the availability of in person appointments. I also discussed with the patient that there may be a patient responsible charge related to this service. The patient expressed understanding and agreed to proceed.   HISTORY/CURRENT STATUS: HPI for 28-month med check.  Patient states that she is doing better than she has in a very long time.  Her mood is more stable and she is not feeling as depressed.  She is able to enjoy things.  She and her brother are getting along much better and she may even be dropping the charges against him.  Her energy and motivation are good.  Denies easy crying.  She is sleeping well but not too much.  She is not isolating any more than she has to right now because of the coronavirus pandemic.  She denies increased energy with decreased need for sleep.  States she is not spending a lot of money.  No grandiosity.  No irritability or easy anger.  No risky or impulsive behaviors.  States that anxiety is well controlled with the Xanax.  She has been a bit more anxious due to the coronavirus but also her dad fell recently and had a stroke.  She is uncertain how he is doing.  He is in the hospital and she is not allowed to go see him.  She of course is worried about him.  Denies muscle or joint pain, stiffness, or dystonia.  Denies dizziness, syncope, seizures, numbness, tingling, tremor, tics, unsteady gait, slurred speech, confusion.   Individual  Medical History/ Review of Systems: Changes? :No    Past medications for mental health diagnoses include: Trilafon, Lamictal, Ativan, Abilify, gabapentin, Remeron, Silenor, Belsomra, Seroquel, Ambien, Zoloft, Vraylar, trazodone, lithium, Celexa, melatonin, Trileptal, trazodone did not work  Allergies: Patient has no known allergies.  Current Medications:  Current Outpatient Medications:  .  ALPRAZolam (XANAX) 0.5 MG tablet, Take 1 tablet (0.5 mg total) by mouth 3 (three) times daily as needed for anxiety. Max 2.5 pills qd, Disp: 75 tablet, Rfl: 5 .  aspirin 81 MG chewable tablet, Chew 81 mg by mouth daily., Disp: , Rfl:  .  citalopram (CELEXA) 20 MG tablet, Take 1.5 tablets (30 mg total) by mouth daily., Disp: 135 tablet, Rfl: 1 .  fenofibrate 160 MG tablet, Take 1 tablet (160 mg total) by mouth daily with breakfast., Disp: 30 tablet, Rfl: 0 .  glipiZIDE (GLUCOTROL) 10 MG tablet, Take 1 tablet (10 mg total) by mouth 2 (two) times daily before a meal., Disp: 60 tablet, Rfl: 0 .  levothyroxine (SYNTHROID, LEVOTHROID) 200 MCG tablet, Take 1 tablet (200 mcg total) by mouth daily before breakfast., Disp: 30 tablet, Rfl: 0 .  Melatonin 3-10 MG TABS, Take 3 mg by mouth., Disp: , Rfl:  .  Oxcarbazepine (TRILEPTAL) 300 MG tablet, Take 1 tablet (300 mg total) by mouth daily., Disp: 90 tablet, Rfl: 1 .  Suvorexant (BELSOMRA) 20 MG TABS, Take 20 mg by mouth at  bedtime as needed., Disp: 30 tablet, Rfl: 5 .  multivitamin-iron-minerals-folic acid (CENTRUM) chewable tablet, Chew 1 tablet by mouth daily., Disp: , Rfl:  Medication Side Effects: none  Family Medical/ Social History: Changes? Yes due to coronavirus, shelter in place  MENTAL HEALTH EXAM:  There were no vitals taken for this visit.There is no height or weight on file to calculate BMI.  General Appearance: phone visit, unable to assess  Eye Contact:  unable to assess  Speech:  Clear and Coherent  Volume:  Normal  Mood:  Euthymic  Affect:   unable to assess  Thought Process:  Goal Directed  Orientation:  Full (Time, Place, and Person)  Thought Content: Logical   Suicidal Thoughts:  No  Homicidal Thoughts:  No  Memory:  WNL  Judgement:  Good  Insight:  Good  Psychomotor Activity:  unable to assess  Concentration:  Concentration: Good  Recall:  Good  Fund of Knowledge: Good  Language: Good  Assets:  Desire for Improvement  ADL's:  Intact  Cognition: WNL  Prognosis:  Good    DIAGNOSES:    ICD-10-CM   1. Bipolar I disorder (HCC) F31.9   2. Generalized anxiety disorder F41.1   3. Insomnia, unspecified type G47.00     Receiving Psychotherapy: No    RECOMMENDATIONS: I am glad to see she is doing so well! Continue Xanax 0.5 mg 3 times daily as needed. Continue Celexa 20 mg 1-1/2 p.o. daily. Continue Oleptro 300 mg daily. Continue Belsomra 20 mg nightly as needed. Continue melatonin OTC 3 mg nightly as needed. Return in 3 months or sooner as needed.   Melony Overlyeresa Tekeshia Klahr, PA-C   This record has been created using [employer] software.  Chart creation errors have been sought, but may not always have been located and corrected. Such creation errors do not reflect on the standard of medical care.

## 2018-09-15 ENCOUNTER — Telehealth: Payer: Self-pay | Admitting: Physician Assistant

## 2018-09-15 NOTE — Telephone Encounter (Signed)
Julie Morrow wants to update you on two new meds her provider Dr. Donald Pore gave her.  They are Losartan potassium 25mg  and Rosuvastatin 20mg .

## 2018-09-15 NOTE — Telephone Encounter (Signed)
ok 

## 2018-09-29 ENCOUNTER — Other Ambulatory Visit: Payer: Self-pay | Admitting: Physician Assistant

## 2018-10-06 ENCOUNTER — Other Ambulatory Visit: Payer: Self-pay | Admitting: Family

## 2018-10-06 DIAGNOSIS — Z1231 Encounter for screening mammogram for malignant neoplasm of breast: Secondary | ICD-10-CM

## 2018-10-13 ENCOUNTER — Other Ambulatory Visit (INDEPENDENT_AMBULATORY_CARE_PROVIDER_SITE_OTHER): Payer: Self-pay | Admitting: Vascular Surgery

## 2018-10-13 DIAGNOSIS — M79605 Pain in left leg: Secondary | ICD-10-CM

## 2018-10-16 ENCOUNTER — Ambulatory Visit (INDEPENDENT_AMBULATORY_CARE_PROVIDER_SITE_OTHER): Payer: Medicare Other | Admitting: Vascular Surgery

## 2018-10-16 ENCOUNTER — Telehealth: Payer: Self-pay | Admitting: Physician Assistant

## 2018-10-16 ENCOUNTER — Encounter (INDEPENDENT_AMBULATORY_CARE_PROVIDER_SITE_OTHER): Payer: Self-pay | Admitting: Vascular Surgery

## 2018-10-16 ENCOUNTER — Ambulatory Visit (INDEPENDENT_AMBULATORY_CARE_PROVIDER_SITE_OTHER): Payer: Medicare Other

## 2018-10-16 ENCOUNTER — Other Ambulatory Visit: Payer: Self-pay

## 2018-10-16 VITALS — BP 129/71 | HR 73 | Resp 16 | Ht 63.0 in | Wt 262.0 lb

## 2018-10-16 DIAGNOSIS — K219 Gastro-esophageal reflux disease without esophagitis: Secondary | ICD-10-CM

## 2018-10-16 DIAGNOSIS — E119 Type 2 diabetes mellitus without complications: Secondary | ICD-10-CM | POA: Diagnosis not present

## 2018-10-16 DIAGNOSIS — M79605 Pain in left leg: Secondary | ICD-10-CM

## 2018-10-16 DIAGNOSIS — I739 Peripheral vascular disease, unspecified: Secondary | ICD-10-CM | POA: Diagnosis not present

## 2018-10-16 DIAGNOSIS — J449 Chronic obstructive pulmonary disease, unspecified: Secondary | ICD-10-CM

## 2018-10-16 DIAGNOSIS — Z7984 Long term (current) use of oral hypoglycemic drugs: Secondary | ICD-10-CM

## 2018-10-16 DIAGNOSIS — Z79899 Other long term (current) drug therapy: Secondary | ICD-10-CM

## 2018-10-16 DIAGNOSIS — Z87891 Personal history of nicotine dependence: Secondary | ICD-10-CM

## 2018-10-16 NOTE — Progress Notes (Signed)
MRN : [id number]  Julie Morrow is a 68 y.o. (11-03-1950) female who presents with chief complaint of  Chief Complaint  Patient presents with  . 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  History of Present Illness:    The patient is seen for evaluation of mildly painful lower extremities. Patient notes the pain is always associated with activity and is very consistent day today.   The patient denies rest pain or dangling of an extremity off the side of the bed during the night for relief. No open wounds or sores at this time. No prior interventions or surgeries.  No history of back problems or DJD of the lumbar sacral spine.   The patient denies changes in claudication symptoms or new rest pain symptoms.  No new ulcers or wounds of the foot.  The patient's blood pressure has been stable and relatively well controlled. The patient denies amaurosis fugax or recent TIA symptoms. There are no recent neurological changes noted. The patient denies history of DVT, PE or superficial thrombophlebitis. The patient denies recent episodes of angina or shortness of breath.   Current Meds  Medication Sig  . ALPRAZolam (XANAX) 0.5 MG tablet Take 1 tablet (0.5 mg total) by mouth 3 (three) times daily as needed for anxiety. Max 2.5 pills qd  . aspirin 81 MG chewable tablet Chew 81 mg by mouth daily.  . citalopram (CELEXA) 20 MG tablet Take 1.5 tablets (30 mg total) by mouth daily.  Marland Kitchen glipiZIDE (GLUCOTROL) 10 MG tablet Take 1 tablet (10 mg total) by mouth 2 (two) times daily before a meal.  . levothyroxine (SYNTHROID, LEVOTHROID) 200 MCG tablet Take 1 tablet (200 mcg total) by mouth daily before breakfast.  . Melatonin 3-10 MG TABS Take 3 mg by mouth.  . multivitamin-iron-minerals-folic acid (CENTRUM) chewable tablet Chew 1 tablet by mouth daily.  . Oxcarbazepine (TRILEPTAL) 300 MG tablet TAKE 1 TABLET(300 MG) BY MOUTH DAILY    Past  Medical History:  Diagnosis Date  . Anxiety   . Bipolar 1 disorder (HCC)   . Bradycardia   . Depression   . Diabetes mellitus, type II (HCC)    Patient takes Glucotrol and Januvia  . Hypothyroidism   . Lithium toxicity     Past Surgical History:  Procedure Laterality Date  . ABDOMINAL HYSTERECTOMY    . CHOLECYSTECTOMY      Social History Social History   Tobacco Use  . Smoking status: Former Smoker    Packs/day: 0.50    Years: 20.00    Pack years: 10.00    Types: Cigarettes    Start date: 11/06/1984  . Smokeless tobacco: Never Used  Substance Use Topics  . Alcohol use: No    Alcohol/week: 0.0 standard drinks  . Drug use: No    Family History Family History  Problem Relation Age of Onset  . Dementia Mother   . Arthritis Father   No family history of bleeding/clotting disorders, porphyria or autoimmune disease   No Known Allergies   REVIEW OF SYSTEMS (Negative unless checked)  Constitutional: Weight loss  Fever  Chills Cardiac: Chest pain   Chest pressure   Palpitations   Shortness of breath when laying flat   Shortness of breath with exertion. Vascular:  Pain in legs with walking   Pain in legs at rest  History of DVT   Phlebitis   Swelling in legs   Varicose  veins   [] Non-healing ulcers Pulmonary:   [] Uses home oxygen   [] Productive cough   [] Hemoptysis   [] Wheeze  [x] COPD   [x] Asthma Neurologic:  [] Dizziness   [] Seizures   [] History of stroke   [] History of TIA  [] Aphasia   [] Vissual changes   [] Weakness or numbness in arm   [] Weakness or numbness in leg Musculoskeletal:   [] Joint swelling   [] Joint pain   [] Low back pain Hematologic:  [] Easy bruising  [] Easy bleeding   [] Hypercoagulable state   [] Anemic Gastrointestinal:  [] Diarrhea   [] Vomiting  [x] Gastroesophageal reflux/heartburn   [] Difficulty swallowing. Genitourinary:  [] Chronic kidney disease   [] Difficult urination  [] Frequent urination   [] Blood in urine Skin:  [] Rashes    [] Ulcers  Psychological:  [x] History of anxiety   []  History of major depression.  Physical Examination  Vitals:   10/16/18 1605  BP: 129/71  Pulse: 73  Resp: 16  Weight: 262 lb (118.8 kg)  Height: 5\' 3"  (1.6 m)   Body mass index is 46.41 kg/m. Gen: WD/WN, NAD, morbidly obese Head: [place]/AT, No temporalis wasting.  Ear/Nose/Throat: Hearing grossly intact, nares w/o erythema or drainage, poor dentition Eyes: PER, EOMI, sclera nonicteric.  Neck: Supple, no masses.  No bruit or JVD.  Pulmonary:  Good air movement, clear to auscultation bilaterally, no use of accessory muscles.  Cardiac: RRR, normal S1, S2, no Murmurs. Vascular: scattered varicosities present bilaterally.  Mild venous stasis changes to the legs bilaterally.  1+ soft pitting edema Vessel Right Left  Radial Palpable Palpable  PT Not Palpable Not Palpable  DP Trace Palpable Trace Palpable  Gastrointestinal: soft, non-distended. No guarding/no peritoneal signs.  Musculoskeletal: M/S 5/5 throughout.  No deformity or atrophy.  Neurologic: CN 2-12 intact. Pain and light touch intact in extremities.  Symmetrical.  Speech is fluent. Motor exam as listed above. Psychiatric: Judgment intact, Mood & affect appropriate for pt's clinical situation. Dermatologic: No rashes or ulcers noted.  No changes consistent with cellulitis. Lymph : No Cervical lymphadenopathy, no lichenification or skin changes of chronic lymphedema.  CBC Lab Results  Component Value Date   WBC 10.0 05/31/2017   HGB 13.0 05/31/2017   HCT 41.7 05/31/2017   MCV 95.0 05/31/2017   PLT 249 05/31/2017    BMET    Component Value Date/Time   NA 138 05/31/2017 0449   NA 138 10/30/2012 1857   K 4.0 05/31/2017 0449   K 3.8 10/30/2012 1857   CL 107 05/31/2017 0449   CL 104 10/30/2012 1857   CO2 20 (L) 05/31/2017 0449   CO2 27 10/30/2012 1857   GLUCOSE 119 (H) 05/31/2017 0449   GLUCOSE 123 (H) 10/30/2012 1857   BUN 15 05/31/2017 0449   BUN 11 10/30/2012  1857   CREATININE 1.32 (H) 05/31/2017 0449   CREATININE 1.09 10/30/2012 1857   CALCIUM 9.0 05/31/2017 0449   CALCIUM 9.2 10/30/2012 1857   GFRNONAA 41 (L) 05/31/2017 0449   GFRNONAA 55 (L) 10/30/2012 1857   GFRAA 48 (L) 05/31/2017 0449   GFRAA >60 10/30/2012 1857   CrCl cannot be calculated (Patient's most recent lab result is older than the maximum 21 days allowed.).  COAG No results found for: INR, PROTIME  Radiology No results found.   Assessment/Plan 1. PAD (peripheral artery disease) (HCC) Recommend:  I do not find evidence of life style limiting vascular disease. The patient specifically denies life style limitation.  Noninvasive studies including ABI's of the legs do not identify critical vascular problems.  The patient should continue walking and begin a more formal exercise program. The patient should continue his antiplatelet therapy and aggressive treatment of the lipid abnormalities.  The patient should begin wearing graduated compression socks 15-20 mmHg strength to control her mild edema.  Patient will follow-up with me in one year  - VAS US ABI WITH/WO TBI; Future  2. COPD GOLD 0 / still smoking  Continue pulmonary medications and aerosols as already ordered, these medications have been reviewed and there are no changes at this time.    3. Gastroesophageal reflux disease without esophagitis Continue PPI as already ordered, this medication has been reviewed and there are no changes at this time.  Avoidence of caffeine and alcohol  Moderate elevation of the head of the bed   4. Controlled type 2 diabetes mellitus without complication, without long-term current use of insulin (HCC) Continue hypoglycemic medications as already ordered, these medications have been reviewed and there are no changes at this time.  Hgb A1C to be monitored as already arranged by primary service    Levora DredgeGregory Eleisha Branscomb, MD  10/16/2018 4:33 PM

## 2018-10-16 NOTE — Telephone Encounter (Signed)
Son called and said that she is on a high and the son wants to know how to get her back down. Medicines have not changed Hadar doesn;t see that she is on a high . Please call him at [phone]

## 2018-10-16 NOTE — Telephone Encounter (Signed)
Please call her son and tell him to decrease the Celexa to 20 mg (1 pill) and increase the Trileptal 300 mg to bid. Have her make appt w/in next 2-3 weeks.  (For the record, in my last note, Dragon interpreted Oleptro (not Trileptal.) I'll correct this at next OV)

## 2018-10-16 NOTE — Telephone Encounter (Signed)
Spoke with son [name]. He verbalized understanding of instructions and will make her an appt. Also, wanted to know if you could go ahead and send in some extra Trileptal since the dose is increasing. Pharmacy is correct in computer. Thanks.

## 2018-10-17 ENCOUNTER — Other Ambulatory Visit: Payer: Self-pay | Admitting: Physician Assistant

## 2018-10-17 MED ORDER — OXCARBAZEPINE 300 MG PO TABS: 300.0000 mg | ORAL_TABLET | Freq: Two times a day (BID) | ORAL | 1 refills | Status: DC

## 2018-10-17 NOTE — Telephone Encounter (Signed)
Sent in new Rx for Trileptal.

## 2018-10-18 ENCOUNTER — Encounter (INDEPENDENT_AMBULATORY_CARE_PROVIDER_SITE_OTHER): Payer: Self-pay | Admitting: Vascular Surgery

## 2018-10-24 ENCOUNTER — Other Ambulatory Visit: Payer: Self-pay

## 2018-10-24 ENCOUNTER — Emergency Department (HOSPITAL_COMMUNITY): Payer: Medicare HMO

## 2018-10-24 ENCOUNTER — Telehealth: Payer: Self-pay | Admitting: Physician Assistant

## 2018-10-24 ENCOUNTER — Telehealth: Payer: Self-pay | Admitting: Psychiatry

## 2018-10-24 ENCOUNTER — Emergency Department (HOSPITAL_COMMUNITY)
Admission: EM | Admit: 2018-10-24 | Discharge: 2018-10-25 | Disposition: A | Discharge status: Other Acute Inpatient Hospital | Payer: Medicare HMO | Attending: Emergency Medicine | Admitting: Emergency Medicine

## 2018-10-24 ENCOUNTER — Encounter (HOSPITAL_COMMUNITY): Payer: Self-pay | Admitting: Emergency Medicine

## 2018-10-24 DIAGNOSIS — J449 Chronic obstructive pulmonary disease, unspecified: Secondary | ICD-10-CM | POA: Insufficient documentation

## 2018-10-24 DIAGNOSIS — Z79899 Other long term (current) drug therapy: Secondary | ICD-10-CM | POA: Insufficient documentation

## 2018-10-24 DIAGNOSIS — F3113 Bipolar disorder, current episode manic without psychotic features, severe: Secondary | ICD-10-CM | POA: Diagnosis not present

## 2018-10-24 DIAGNOSIS — Z7982 Long term (current) use of aspirin: Secondary | ICD-10-CM | POA: Insufficient documentation

## 2018-10-24 DIAGNOSIS — E039 Hypothyroidism, unspecified: Secondary | ICD-10-CM | POA: Insufficient documentation

## 2018-10-24 DIAGNOSIS — Z1159 Encounter for screening for other viral diseases: Secondary | ICD-10-CM | POA: Diagnosis not present

## 2018-10-24 DIAGNOSIS — Z915 Personal history of self-harm: Secondary | ICD-10-CM | POA: Diagnosis not present

## 2018-10-24 DIAGNOSIS — W19XXXA Unspecified fall, initial encounter: Secondary | ICD-10-CM | POA: Insufficient documentation

## 2018-10-24 DIAGNOSIS — R4182 Altered mental status, unspecified: Secondary | ICD-10-CM | POA: Diagnosis present

## 2018-10-24 DIAGNOSIS — F319 Bipolar disorder, unspecified: Secondary | ICD-10-CM

## 2018-10-24 DIAGNOSIS — F17210 Nicotine dependence, cigarettes, uncomplicated: Secondary | ICD-10-CM | POA: Insufficient documentation

## 2018-10-24 DIAGNOSIS — N3000 Acute cystitis without hematuria: Secondary | ICD-10-CM

## 2018-10-24 LAB — CBC WITH DIFFERENTIAL/PLATELET
Abs Immature Granulocytes: 0.03 10*3/uL (ref 0.00–0.07)
Basophils Absolute: 0.1 10*3/uL (ref 0.0–0.1)
Basophils Relative: 1 %
Eosinophils Absolute: 0.2 10*3/uL (ref 0.0–0.5)
Eosinophils Relative: 2 %
HCT: 44.1 % (ref 36.0–46.0)
Hemoglobin: 14.2 g/dL (ref 12.0–15.0)
Immature Granulocytes: 0 %
Lymphocytes Relative: 20 %
Lymphs Abs: 1.9 10*3/uL (ref 0.7–4.0)
MCH: 29.3 pg (ref 26.0–34.0)
MCHC: 32.2 g/dL (ref 30.0–36.0)
MCV: 90.9 fL (ref 80.0–100.0)
Monocytes Absolute: 0.7 10*3/uL (ref 0.1–1.0)
Monocytes Relative: 8 %
Neutro Abs: 6.3 10*3/uL (ref 1.7–7.7)
Neutrophils Relative %: 69 %
Platelets: 209 10*3/uL (ref 150–400)
RBC: 4.85 MIL/uL (ref 3.87–5.11)
RDW: 15.6 % — ABNORMAL HIGH (ref 11.5–15.5)
WBC: 9.1 10*3/uL (ref 4.0–10.5)
nRBC: 0 % (ref 0.0–0.2)

## 2018-10-24 LAB — POCT I-STAT 7, (LYTES, BLD GAS, ICA,H+H)
Acid-Base Excess: 1 mmol/L (ref 0.0–2.0)
Bicarbonate: 26.9 mmol/L (ref 20.0–28.0)
Calcium, Ion: 1.23 mmol/L (ref 1.15–1.40)
HCT: 41 % (ref 36.0–46.0)
Hemoglobin: 13.9 g/dL (ref 12.0–15.0)
O2 Saturation: 90 %
Patient temperature: 98.4
Potassium: 3.9 mmol/L (ref 3.5–5.1)
Sodium: 130 mmol/L — ABNORMAL LOW (ref 135–145)
TCO2: 28 mmol/L (ref 22–32)
pCO2 arterial: 45.8 mmHg (ref 32.0–48.0)
pH, Arterial: 7.376 (ref 7.350–7.450)
pO2, Arterial: 60 mmHg — ABNORMAL LOW (ref 83.0–108.0)

## 2018-10-24 LAB — AMMONIA: Ammonia: 21 umol/L (ref 9–35)

## 2018-10-24 LAB — COMPREHENSIVE METABOLIC PANEL
ALT: 30 U/L (ref 0–44)
AST: 33 U/L (ref 15–41)
Albumin: 3.9 g/dL (ref 3.5–5.0)
Alkaline Phosphatase: 88 U/L (ref 38–126)
Anion gap: 13 (ref 5–15)
BUN: 12 mg/dL (ref 8–23)
CO2: 23 mmol/L (ref 22–32)
Calcium: 9.3 mg/dL (ref 8.9–10.3)
Chloride: 94 mmol/L — ABNORMAL LOW (ref 98–111)
Creatinine, Ser: 0.84 mg/dL (ref 0.44–1.00)
GFR calc Af Amer: >60 mL/min (ref >60)
GFR calc non Af Amer: >60 mL/min (ref >60)
Glucose, Bld: 73 mg/dL (ref 70–99)
Potassium: 4 mmol/L (ref 3.5–5.1)
Sodium: 130 mmol/L — ABNORMAL LOW (ref 135–145)
Total Bilirubin: 0.5 mg/dL (ref 0.3–1.2)
Total Protein: 6.3 g/dL — ABNORMAL LOW (ref 6.5–8.1)

## 2018-10-24 LAB — URINALYSIS, ROUTINE W REFLEX MICROSCOPIC
Bilirubin Urine: NEGATIVE
Glucose, UA: NEGATIVE mg/dL
Hgb urine dipstick: NEGATIVE
Ketones, ur: NEGATIVE mg/dL
Leukocytes,Ua: NEGATIVE
Nitrite: POSITIVE — AB
Protein, ur: NEGATIVE mg/dL
Specific Gravity, Urine: 1.008 (ref 1.005–1.030)
pH: 7 (ref 5.0–8.0)

## 2018-10-24 LAB — CBG MONITORING, ED
Glucose-Capillary: 64 mg/dL — ABNORMAL LOW (ref 70–99)
Glucose-Capillary: 75 mg/dL (ref 70–99)

## 2018-10-24 LAB — RAPID URINE DRUG SCREEN, HOSP PERFORMED
Amphetamines: NOT DETECTED
Barbiturates: NOT DETECTED
Benzodiazepines: NOT DETECTED
Cocaine: NOT DETECTED
Opiates: NOT DETECTED
Tetrahydrocannabinol: NOT DETECTED

## 2018-10-24 LAB — ETHANOL: Alcohol, Ethyl (B): <10 mg/dL (ref <10)

## 2018-10-24 LAB — SARS CORONAVIRUS 2: SARS Coronavirus 2: NOT DETECTED

## 2018-10-24 MED ORDER — MELATONIN 3-10 MG PO TABS: 3.0000 mg | ORAL_TABLET | Freq: Every evening | ORAL | Status: DC | PRN

## 2018-10-24 MED ORDER — MELATONIN 3 MG PO TABS
3.0000 mg | ORAL_TABLET | Freq: Every evening | ORAL | Status: DC | PRN
  Filled 2018-10-24: qty 1

## 2018-10-24 MED ORDER — CENTRUM PO CHEW: 1.0000 | CHEWABLE_TABLET | Freq: Every day | ORAL | Status: DC

## 2018-10-24 MED ORDER — CITALOPRAM HYDROBROMIDE 10 MG PO TABS
30.0000 mg | ORAL_TABLET | Freq: Every day | ORAL | Status: DC
  Administered 2018-10-24 – 2018-10-25 (×2): 30 mg via ORAL
  Filled 2018-10-24 (×2): qty 3

## 2018-10-24 MED ORDER — GLIPIZIDE 10 MG PO TABS
10.0000 mg | ORAL_TABLET | Freq: Two times a day (BID) | ORAL | Status: DC
  Administered 2018-10-24 – 2018-10-25 (×2): 10 mg via ORAL
  Filled 2018-10-24 (×4): qty 1

## 2018-10-24 MED ORDER — NICOTINE 21 MG/24HR TD PT24
21.0000 mg | MEDICATED_PATCH | Freq: Every day | TRANSDERMAL | Status: DC
  Administered 2018-10-24 – 2018-10-25 (×2): 21 mg via TRANSDERMAL
  Filled 2018-10-24 (×2): qty 1

## 2018-10-24 MED ORDER — ALPRAZOLAM 0.5 MG PO TABS: 0.5000 mg | ORAL_TABLET | Freq: Three times a day (TID) | ORAL | Status: DC | PRN

## 2018-10-24 MED ORDER — ACETAMINOPHEN 325 MG PO TABS: 650.0000 mg | ORAL_TABLET | ORAL | Status: DC | PRN

## 2018-10-24 MED ORDER — SUVOREXANT 20 MG PO TABS: 20.0000 mg | ORAL_TABLET | Freq: Every evening | ORAL | Status: DC | PRN

## 2018-10-24 MED ORDER — ADULT MULTIVITAMIN W/MINERALS CH
1.0000 | ORAL_TABLET | Freq: Every day | ORAL | Status: DC
  Administered 2018-10-24 – 2018-10-25 (×2): 1 via ORAL
  Filled 2018-10-24 (×2): qty 1

## 2018-10-24 MED ORDER — OXCARBAZEPINE 300 MG PO TABS
300.0000 mg | ORAL_TABLET | Freq: Two times a day (BID) | ORAL | Status: DC
  Administered 2018-10-24 – 2018-10-25 (×2): 300 mg via ORAL
  Filled 2018-10-24 (×2): qty 1

## 2018-10-24 MED ORDER — NYSTATIN 100000 UNIT/GM EX POWD
Freq: Two times a day (BID) | CUTANEOUS | Status: DC | PRN
  Filled 2018-10-24: qty 15

## 2018-10-24 MED ORDER — LACTATED RINGERS IV BOLUS
1000.0000 mL | Freq: Once | INTRAVENOUS | Status: AC
  Administered 2018-10-24: 09:00:00 1000 mL via INTRAVENOUS

## 2018-10-24 MED ORDER — SUVOREXANT 20 MG PO TABS
20.0000 mg | ORAL_TABLET | Freq: Every evening | ORAL | Status: DC | PRN
  Filled 2018-10-24: qty 1

## 2018-10-24 MED ORDER — LOSARTAN POTASSIUM 50 MG PO TABS
25.0000 mg | ORAL_TABLET | Freq: Every day | ORAL | Status: DC
  Administered 2018-10-24 – 2018-10-25 (×2): 25 mg via ORAL
  Filled 2018-10-24 (×2): qty 1

## 2018-10-24 MED ORDER — ASPIRIN 81 MG PO CHEW
81.0000 mg | CHEWABLE_TABLET | Freq: Every day | ORAL | Status: DC
  Administered 2018-10-24 – 2018-10-25 (×2): 81 mg via ORAL
  Filled 2018-10-24 (×2): qty 1

## 2018-10-24 MED ORDER — LEVOTHYROXINE SODIUM 100 MCG PO TABS
200.0000 ug | ORAL_TABLET | Freq: Every day | ORAL | Status: DC
  Administered 2018-10-25: 200 ug via ORAL
  Filled 2018-10-24: qty 2

## 2018-10-24 NOTE — Progress Notes (Signed)
Pt accepted to [facility]   Dr. Alcide Clever is the accepting/attending provider.   Call report to [phone] [facility] ED notified.    Pt is voluntary and can be transported by [facility].   Pt is scheduled to arrive at [facility] on 10/25/18 at [place], [place], [place] Disposition [place] [facility] [phone]
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
## 2018-10-24 NOTE — ED Notes (Signed)
Pt states she lives w/her father and has a son, Thurmond Butts. Pt ate lunch. Pt has ambulated to bathroom x 2 w/o difficulty. Aware waiting for Inpt Placement.

## 2018-10-24 NOTE — ED Notes (Signed)
Pt sleeping while VS's taken.
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
## 2018-10-24 NOTE — ED Notes (Signed)
TTS being performed.  
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
## 2018-10-24 NOTE — ED Notes (Signed)
Pt given portable phone so she can call her father back.

## 2018-10-24 NOTE — Progress Notes (Signed)
Pt. meets criteria for inpatient treatment per Mordecai Maes, NP.  No appropriate beds available at [facility]. Referred out to the following hospitals: [facility]   Disposition CSW will continue to follow for placement.  Areatha Keas. Judi Cong, MSW, [place] Disposition Clinical Social Work [phone] (cell) [phone] (office)

## 2018-10-24 NOTE — ED Notes (Addendum)
Pt's father called - advised pt is currently sleeping and will ask her to call him when she wakes up.

## 2018-10-24 NOTE — ED Notes (Addendum)
Pt arrived to [facility] via w/c - wearing burgundy scrubs. Pt's labeled belongings bag placed at nurses' desk for inventory. Pt aware waiting for TTS.

## 2018-10-24 NOTE — ED Notes (Signed)
ALL belongings inventoried by [place] NT - 1 labeled belongings placed in [place] [facility] - [place].

## 2018-10-24 NOTE — ED Notes (Signed)
Portable xray at bedside.
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
## 2018-10-24 NOTE — Telephone Encounter (Signed)
Ok, thanks.

## 2018-10-24 NOTE — Telephone Encounter (Signed)
Thanks for the update

## 2018-10-25 LAB — CBG MONITORING, ED: Glucose-Capillary: 95 mg/dL (ref 70–99)

## 2018-10-25 MED ORDER — CEPHALEXIN 500 MG PO CAPS: 500.0000 mg | ORAL_CAPSULE | Freq: Two times a day (BID) | ORAL | 0 refills | Status: AC

## 2018-10-25 MED ORDER — LORATADINE 10 MG PO TABS
10.0000 mg | ORAL_TABLET | Freq: Once | ORAL | Status: AC
  Administered 2018-10-25: 12:00:00 10 mg via ORAL
  Filled 2018-10-25: qty 1

## 2018-10-25 NOTE — ED Notes (Signed)
IVC paperwork signed and served, original placed in red folder copy faxed to [facility] and placed in medical records

## 2018-10-25 NOTE — ED Notes (Signed)
Graham crackers and cranberry juice given to pt.

## 2018-10-25 NOTE — Progress Notes (Signed)
CSW contacted because patient is unable/unwilling to sign in to [facility] on a Voluntary basis.  CSW contacted patient's son, who is her Medical and Legal POA.  Son able to fax POA and willing to sign patient in if OV is in agreement.  CSW contacted OV [facility] and spoke to [place], Intake and Assessment, who stated that son could only sign patient in if he is the Legal Guardian.  Since son is not LG, CSW contacted patient's RN to ask that the EDP IVC her.  CSW contacted [facility] and advised of delay in transport due to need to IVC patient.  CSW contacted patient's son and advised of same.  Areatha Keas. Judi Cong, MSW, [place] Disposition Clinical Social Work [phone] (cell) [phone] (office)

## 2018-10-25 NOTE — ED Provider Notes (Signed)
  Physical Exam  BP (!) 135/54 (BP Location: Left Arm)   Pulse (!) 52   Temp 98 F (36.7 C) (Oral)   Resp 16   Ht 5\' 3"  (1.6 m)   Wt 118.8 kg   SpO2 96%   BMI 46.41 kg/m   Physical Exam Constitutional:      Appearance: She is well-developed.  HENT:     Head: Normocephalic.  Eyes:     General: Lids are normal.  Neck:     Musculoskeletal: Normal range of motion.  Cardiovascular:     Rate and Rhythm: Normal rate.  Pulmonary:     Effort: Pulmonary effort is normal. No respiratory distress.  Musculoskeletal: Normal range of motion.  Neurological:     Mental Status: She is alert.  Psychiatric:        Attention and Perception: Attention normal.     Comments: Awake sitting on side of the bed eating breakfast.      ED Course/Procedures   Clinical Course as of Oct 24 845  Wed Oct 25, 2018  0843 Nitrite(!): POSITIVE [CG]  0843 Bacteria, UA(!): FEW [CG]    Clinical Course User Index [CG] Kinnie Feil, PA-C    Procedures  MDM   0830: Evaluated pt. VSS. No distress. ED work up reviewed by me, remarkable nitrite in urine. She has not received antibiotics for UTI, will need to be discharged with prescription for keflex. No fever, leukocytosis. Med cleared on 6/9. [facility] evaluated on 6/9 recommended inpatient treatment. She has been accepted to [facility] expecting transfer 6/10 at 1000.  I will prescribe keflex for UTI.    Kinnie Feil, PA-C 10/25/18 0737    Blanchie Dessert, MD 10/27/18 2124

## 2018-10-25 NOTE — ED Notes (Signed)
Lunch tray ordered 

## 2018-10-25 NOTE — Discharge Instructions (Addendum)
You have a urinary tract infection. Take keflex twice a day for 7 days. Return for fever, chills, lower abdominal or flank pain.

## 2018-11-07 ENCOUNTER — Telehealth: Payer: Self-pay | Admitting: Physician Assistant

## 2018-11-07 ENCOUNTER — Other Ambulatory Visit: Payer: Self-pay | Admitting: Physician Assistant

## 2018-11-07 MED ORDER — CITALOPRAM HYDROBROMIDE 20 MG PO TABS: ORAL_TABLET | ORAL | 1 refills | Status: DC

## 2018-11-07 MED ORDER — RISPERIDONE 0.5 MG PO TABS: 1.0000 mg | ORAL_TABLET | Freq: Every day | ORAL | 0 refills | Status: DC

## 2018-11-07 NOTE — Telephone Encounter (Signed)
Also, there's a form for adult home care that we're supposed to fill out.  Asking for ADL info, etc.  Tell Ryan we can't fill it out until we see her.

## 2018-11-07 NOTE — Telephone Encounter (Signed)
I've got discharge meds from [facility] on her now.  According to it, she was started on Risperdal 0.5mg .  Let's increase to 1mg  (I put it in chart as No Print, just to document it.  If we need to send in Rx, we can.)  She should take 2 pills of what she has now to equal 1mg . Wean off Celexa 20 mg.  Go to 1 pill (20mg ) qd for a week, then 1/2 pill qd for a week. (I also put in chart as no print.)  He should still try to get her in asap. Thanks
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
## 2018-11-08 ENCOUNTER — Ambulatory Visit (INDEPENDENT_AMBULATORY_CARE_PROVIDER_SITE_OTHER): Payer: Self-pay | Admitting: Physician Assistant

## 2018-11-08 ENCOUNTER — Other Ambulatory Visit: Payer: Self-pay

## 2018-11-08 DIAGNOSIS — F411 Generalized anxiety disorder: Secondary | ICD-10-CM

## 2018-11-08 DIAGNOSIS — G4700 Insomnia, unspecified: Secondary | ICD-10-CM

## 2018-11-08 DIAGNOSIS — F3110 Bipolar disorder, current episode manic without psychotic features, unspecified: Secondary | ICD-10-CM

## 2018-11-08 MED ORDER — RISPERIDONE 0.5 MG PO TABS: 0.5000 mg | ORAL_TABLET | Freq: Every day | ORAL | 1 refills | Status: DC

## 2018-11-08 MED ORDER — BELSOMRA 20 MG PO TABS: 20.0000 mg | ORAL_TABLET | Freq: Every evening | ORAL | 1 refills | Status: DC | PRN

## 2018-11-08 MED ORDER — CITALOPRAM HYDROBROMIDE 20 MG PO TABS: ORAL_TABLET | ORAL | 1 refills | Status: DC

## 2018-11-08 MED ORDER — OXCARBAZEPINE 300 MG PO TABS: 300.0000 mg | ORAL_TABLET | Freq: Three times a day (TID) | ORAL | 0 refills | Status: DC

## 2018-11-08 NOTE — Telephone Encounter (Signed)
Pt has appt today
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
## 2018-11-09 ENCOUNTER — Encounter: Payer: Self-pay | Admitting: Physician Assistant

## 2018-11-13 ENCOUNTER — Telehealth: Payer: Self-pay | Admitting: Physician Assistant
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
## 2018-11-13 NOTE — Telephone Encounter (Signed)
They need to call 911 and initiate commitment if patient refuses to go to the hospital.

## 2018-11-14 NOTE — Telephone Encounter (Signed)
Ok.  We have to do something for the Bipolar d/o.   Let's stop the Risperdal and re-start Seroquel XR 150 mg qhs for 4 nights, then increase to 2 qhs. If she starts threatening again, she is going to have to go to the emergency room.  If he's willing to do Seroquel, let me know and I can send it in.  Thanks

## 2018-11-14 NOTE — Telephone Encounter (Signed)
Left voice mail to call back 
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
## 2018-11-15 NOTE — Telephone Encounter (Signed)
She's not sleeping b/c she's manic and is the reason she needs an atypical!  But I agree, I don't want her stumbling all over the place.  Start Zyprexa 5 mg qhs for 3 days, then 2 qhs.  She's off the Risperdal, right?  If not, stop it.  Decrease Trileptal 300 mg to twice a day. They have to work w/ me here.  I know these meds have side effects, but we won't get improvement if we don't make changes.  O/W go to ER.

## 2018-11-16 NOTE — Telephone Encounter (Signed)
Left voicemail to discuss changes

## 2018-11-20 NOTE — Telephone Encounter (Signed)
Given information to [place], she is off of the Risperdal and sleeping better he said. Will decrease Trileptal as recommended. He wants to check into the Zyprexa before having that filled, will call back once he has an answer.

## 2018-11-21 ENCOUNTER — Emergency Department
Admission: EM | Admit: 2018-11-21 | Discharge: 2018-11-21 | Disposition: A | Discharge status: Home / Self Care | Payer: Medicare HMO

## 2018-11-21 NOTE — ED Notes (Signed)
Drew labs as requested by Statistician, then gave them to the officer.
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
## 2018-11-21 NOTE — ED Notes (Signed)
Pt was started triage in error.  Here in police custody for forensic blood draw in order to go to jail only.

## 2018-11-21 NOTE — ED Triage Notes (Signed)
First Nurse Note:  Arrives with [employer] voluntarily for behavioral health eval.  Patient is AAOx3.  Skin warm and dry.  Calm and cooperative. NAD

## 2018-11-27 ENCOUNTER — Ambulatory Visit: Payer: Medicare Other | Admitting: Physician Assistant

## 2018-11-27 ENCOUNTER — Encounter

## 2018-12-11 ENCOUNTER — Ambulatory Visit: Payer: Self-pay | Admitting: Physician Assistant

## 2018-12-12 ENCOUNTER — Telehealth: Payer: Self-pay | Admitting: Physician Assistant

## 2018-12-12 ENCOUNTER — Other Ambulatory Visit: Payer: Self-pay | Admitting: Physician Assistant

## 2018-12-12 MED ORDER — QUETIAPINE FUMARATE 100 MG PO TABS: ORAL_TABLET | ORAL | 1 refills | Status: DC

## 2018-12-12 NOTE — Telephone Encounter (Signed)
Ryan called back to add the Momoko is now taking just 1/2 pill of the Celexa.  She has been manic.  He said you mentioned this might happen, and it did.  She does better with the seroquel added.  There is no appt scheduled.

## 2018-12-12 NOTE — Telephone Encounter (Signed)
Julie Morrow the son of Kavitha Lansdale stated he requested a refill of the Seroquel for his mom. Stated he spoke with the nurse concerning this medication. He asked that it be filled at the [facility] on [address].

## 2018-12-12 NOTE — Telephone Encounter (Signed)
Pt.'s son Thurmond Butts made aware and verbalized understanding. He will make an appt. After he talks with his mom to make sure she has no other upcoming appointments.

## 2018-12-12 NOTE — Telephone Encounter (Signed)
I sent in Rx for Seroquel. Can they cut  Celexa in 1/4?  If so, go ahead and do that for a week, then stop it. Have them make appt.

## 2018-12-13 ENCOUNTER — Ambulatory Visit: Payer: Self-pay | Admitting: Physician Assistant

## 2018-12-27 ENCOUNTER — Inpatient Hospital Stay
Admission: AD | Admit: 2018-12-27 | Discharge: 2019-01-05 | DRG: 885 | Disposition: A | Discharge status: Home / Self Care | Payer: Medicare HMO | Source: Intra-hospital | Attending: Psychiatry | Admitting: Psychiatry

## 2018-12-27 ENCOUNTER — Emergency Department (HOSPITAL_COMMUNITY): Payer: Medicare HMO

## 2018-12-27 ENCOUNTER — Other Ambulatory Visit: Payer: Self-pay

## 2018-12-27 ENCOUNTER — Emergency Department (HOSPITAL_COMMUNITY)
Admission: EM | Admit: 2018-12-27 | Discharge: 2018-12-27 | Disposition: A | Discharge status: Other Acute Inpatient Hospital | Payer: Medicare HMO | Attending: Emergency Medicine | Admitting: Emergency Medicine

## 2018-12-27 ENCOUNTER — Encounter (HOSPITAL_COMMUNITY): Payer: Self-pay | Admitting: Emergency Medicine

## 2018-12-27 DIAGNOSIS — Z751 Person awaiting admission to adequate facility elsewhere: Secondary | ICD-10-CM

## 2018-12-27 DIAGNOSIS — E785 Hyperlipidemia, unspecified: Secondary | ICD-10-CM | POA: Diagnosis present

## 2018-12-27 DIAGNOSIS — F3110 Bipolar disorder, current episode manic without psychotic features, unspecified: Secondary | ICD-10-CM | POA: Diagnosis not present

## 2018-12-27 DIAGNOSIS — F3113 Bipolar disorder, current episode manic without psychotic features, severe: Secondary | ICD-10-CM | POA: Insufficient documentation

## 2018-12-27 DIAGNOSIS — Z20828 Contact with and (suspected) exposure to other viral communicable diseases: Secondary | ICD-10-CM | POA: Diagnosis not present

## 2018-12-27 DIAGNOSIS — F419 Anxiety disorder, unspecified: Secondary | ICD-10-CM | POA: Diagnosis present

## 2018-12-27 DIAGNOSIS — Z8261 Family history of arthritis: Secondary | ICD-10-CM

## 2018-12-27 DIAGNOSIS — Z7982 Long term (current) use of aspirin: Secondary | ICD-10-CM

## 2018-12-27 DIAGNOSIS — Z7984 Long term (current) use of oral hypoglycemic drugs: Secondary | ICD-10-CM | POA: Diagnosis not present

## 2018-12-27 DIAGNOSIS — Z79890 Hormone replacement therapy (postmenopausal): Secondary | ICD-10-CM

## 2018-12-27 DIAGNOSIS — Z046 Encounter for general psychiatric examination, requested by authority: Secondary | ICD-10-CM

## 2018-12-27 DIAGNOSIS — Z820 Family history of epilepsy and other diseases of the nervous system: Secondary | ICD-10-CM | POA: Diagnosis not present

## 2018-12-27 DIAGNOSIS — F319 Bipolar disorder, unspecified: Principal | ICD-10-CM | POA: Diagnosis present

## 2018-12-27 DIAGNOSIS — Z6841 Body Mass Index (BMI) 40.0 and over, adult: Secondary | ICD-10-CM | POA: Diagnosis not present

## 2018-12-27 DIAGNOSIS — E119 Type 2 diabetes mellitus without complications: Secondary | ICD-10-CM | POA: Insufficient documentation

## 2018-12-27 DIAGNOSIS — Z79899 Other long term (current) drug therapy: Secondary | ICD-10-CM | POA: Diagnosis not present

## 2018-12-27 DIAGNOSIS — R4689 Other symptoms and signs involving appearance and behavior: Secondary | ICD-10-CM | POA: Diagnosis present

## 2018-12-27 DIAGNOSIS — E039 Hypothyroidism, unspecified: Secondary | ICD-10-CM | POA: Diagnosis not present

## 2018-12-27 DIAGNOSIS — Z87891 Personal history of nicotine dependence: Secondary | ICD-10-CM | POA: Diagnosis not present

## 2018-12-27 LAB — URINALYSIS, ROUTINE W REFLEX MICROSCOPIC
Bilirubin Urine: NEGATIVE
Glucose, UA: NEGATIVE mg/dL
Hgb urine dipstick: NEGATIVE
Ketones, ur: NEGATIVE mg/dL
Nitrite: POSITIVE — AB
Protein, ur: NEGATIVE mg/dL
Specific Gravity, Urine: 1.006 (ref 1.005–1.030)
pH: 6 (ref 5.0–8.0)

## 2018-12-27 LAB — COMPREHENSIVE METABOLIC PANEL
ALT: 26 U/L (ref 0–44)
AST: 25 U/L (ref 15–41)
Albumin: 3.7 g/dL (ref 3.5–5.0)
Alkaline Phosphatase: 99 U/L (ref 38–126)
Anion gap: 9 (ref 5–15)
BUN: 21 mg/dL (ref 8–23)
CO2: 25 mmol/L (ref 22–32)
Calcium: 9 mg/dL (ref 8.9–10.3)
Chloride: 106 mmol/L (ref 98–111)
Creatinine, Ser: 0.85 mg/dL (ref 0.44–1.00)
GFR calc Af Amer: >60 mL/min (ref >60)
GFR calc non Af Amer: >60 mL/min (ref >60)
Glucose, Bld: 170 mg/dL — ABNORMAL HIGH (ref 70–99)
Potassium: 4 mmol/L (ref 3.5–5.1)
Sodium: 140 mmol/L (ref 135–145)
Total Bilirubin: 0.6 mg/dL (ref 0.3–1.2)
Total Protein: 6.5 g/dL (ref 6.5–8.1)

## 2018-12-27 LAB — CBC
HCT: 41.6 % (ref 36.0–46.0)
Hemoglobin: 13.1 g/dL (ref 12.0–15.0)
MCH: 29.2 pg (ref 26.0–34.0)
MCHC: 31.5 g/dL (ref 30.0–36.0)
MCV: 92.7 fL (ref 80.0–100.0)
Platelets: 149 10*3/uL — ABNORMAL LOW (ref 150–400)
RBC: 4.49 MIL/uL (ref 3.87–5.11)
RDW: 16.5 % — ABNORMAL HIGH (ref 11.5–15.5)
WBC: 9.4 10*3/uL (ref 4.0–10.5)
nRBC: 0 % (ref 0.0–0.2)

## 2018-12-27 LAB — CBG MONITORING, ED
Glucose-Capillary: 85 mg/dL (ref 70–99)
Glucose-Capillary: 110 mg/dL — ABNORMAL HIGH (ref 70–99)

## 2018-12-27 LAB — GLUCOSE, CAPILLARY: Glucose-Capillary: 104 mg/dL — ABNORMAL HIGH (ref 70–99)

## 2018-12-27 LAB — RAPID URINE DRUG SCREEN, HOSP PERFORMED
Amphetamines: NOT DETECTED
Barbiturates: NOT DETECTED
Benzodiazepines: NOT DETECTED
Cocaine: NOT DETECTED
Opiates: NOT DETECTED
Tetrahydrocannabinol: NOT DETECTED

## 2018-12-27 LAB — ETHANOL: Alcohol, Ethyl (B): <10 mg/dL (ref <10)

## 2018-12-27 LAB — SARS CORONAVIRUS 2 BY RT PCR (HOSPITAL ORDER, PERFORMED IN ~~LOC~~ HOSPITAL LAB): SARS Coronavirus 2: NEGATIVE

## 2018-12-27 LAB — ACETAMINOPHEN LEVEL: Acetaminophen (Tylenol), Serum: <10 ug/mL — ABNORMAL LOW (ref 10–30)

## 2018-12-27 LAB — SALICYLATE LEVEL: Salicylate Lvl: <7 mg/dL (ref 2.8–30.0)

## 2018-12-27 MED ORDER — LOSARTAN POTASSIUM 25 MG PO TABS
25.0000 mg | ORAL_TABLET | Freq: Every day | ORAL | Status: DC
  Administered 2018-12-28 – 2018-12-29 (×2): 25 mg via ORAL
  Filled 2018-12-27 (×2): qty 1

## 2018-12-27 MED ORDER — HYDROXYZINE HCL 25 MG PO TABS
25.0000 mg | ORAL_TABLET | Freq: Three times a day (TID) | ORAL | Status: DC | PRN
  Administered 2018-12-27: 25 mg via ORAL
  Filled 2018-12-27 (×5): qty 1

## 2018-12-27 MED ORDER — LEVOTHYROXINE SODIUM 200 MCG PO TABS
200.0000 ug | ORAL_TABLET | Freq: Every day | ORAL | Status: DC
  Administered 2018-12-27: 200 ug via ORAL
  Filled 2018-12-27: qty 1

## 2018-12-27 MED ORDER — ALUM & MAG HYDROXIDE-SIMETH 200-200-20 MG/5ML PO SUSP: 30.0000 mL | ORAL | Status: DC | PRN

## 2018-12-27 MED ORDER — FENOFIBRATE 160 MG PO TABS
160.0000 mg | ORAL_TABLET | Freq: Every day | ORAL | Status: DC
  Administered 2018-12-28 – 2019-01-05 (×9): 160 mg via ORAL
  Filled 2018-12-27 (×10): qty 1

## 2018-12-27 MED ORDER — ACETAMINOPHEN 325 MG PO TABS
650.0000 mg | ORAL_TABLET | Freq: Four times a day (QID) | ORAL | Status: DC | PRN
  Administered 2018-12-28 – 2019-01-05 (×9): 650 mg via ORAL
  Filled 2018-12-27 (×10): qty 2

## 2018-12-27 MED ORDER — CIPROFLOXACIN HCL 500 MG PO TABS: 500.0000 mg | ORAL_TABLET | Freq: Two times a day (BID) | ORAL | Status: DC

## 2018-12-27 MED ORDER — LOSARTAN POTASSIUM 25 MG PO TABS
25.0000 mg | ORAL_TABLET | Freq: Every day | ORAL | Status: DC
  Administered 2018-12-27: 10:00:00 25 mg via ORAL
  Filled 2018-12-27: qty 1

## 2018-12-27 MED ORDER — ASPIRIN 81 MG PO CHEW
81.0000 mg | CHEWABLE_TABLET | Freq: Every day | ORAL | Status: DC
  Administered 2018-12-27: 81 mg via ORAL
  Filled 2018-12-27: qty 1

## 2018-12-27 MED ORDER — MAGNESIUM HYDROXIDE 400 MG/5ML PO SUSP: 30.0000 mL | Freq: Every day | ORAL | Status: DC | PRN

## 2018-12-27 MED ORDER — GLIPIZIDE 10 MG PO TABS
10.0000 mg | ORAL_TABLET | Freq: Two times a day (BID) | ORAL | Status: DC
  Administered 2018-12-27 (×2): 10 mg via ORAL
  Filled 2018-12-27 (×2): qty 1

## 2018-12-27 MED ORDER — CIPROFLOXACIN HCL 500 MG PO TABS
500.0000 mg | ORAL_TABLET | Freq: Two times a day (BID) | ORAL | Status: AC
  Administered 2018-12-27 – 2018-12-30 (×6): 500 mg via ORAL
  Filled 2018-12-27 (×6): qty 1

## 2018-12-27 MED ORDER — GLIPIZIDE 10 MG PO TABS
10.0000 mg | ORAL_TABLET | Freq: Two times a day (BID) | ORAL | Status: DC
  Administered 2018-12-28 – 2019-01-05 (×17): 10 mg via ORAL
  Filled 2018-12-27 (×19): qty 1

## 2018-12-27 MED ORDER — LEVOTHYROXINE SODIUM 200 MCG PO TABS
200.0000 ug | ORAL_TABLET | Freq: Every day | ORAL | Status: DC
  Administered 2018-12-28 – 2019-01-05 (×9): 200 ug via ORAL
  Filled 2018-12-27 (×3): qty 2
  Filled 2018-12-27 (×3): qty 1
  Filled 2018-12-27 (×2): qty 2
  Filled 2018-12-27 (×3): qty 1
  Filled 2018-12-27: qty 2
  Filled 2018-12-27 (×3): qty 1
  Filled 2018-12-27 (×2): qty 2

## 2018-12-27 MED ORDER — ASPIRIN 81 MG PO CHEW
81.0000 mg | CHEWABLE_TABLET | Freq: Every day | ORAL | Status: DC
  Administered 2018-12-28 – 2019-01-05 (×9): 81 mg via ORAL
  Filled 2018-12-27 (×9): qty 1
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
## 2018-12-27 NOTE — BH Assessment (Signed)
Patient has been accepted to [facility].  Accepting physician is Sheran Fava, NP.  Attending Physician will be Dr. Weber Cooks.  Patient has been assigned to room 310, by [place].  Call report to [phone].  Representative/Transfer Coordinator is Print production planner [facility] TTS) Patient pre-admitted by [facility] Patient Access Elberta Fortis)   [facility] ER Staff Blair Heys, TTS) made aware of acceptance.

## 2018-12-27 NOTE — ED Provider Notes (Signed)
Clinical Course as of Dec 26 1008  Wed Dec 27, 2018  1010 ECG is sinus bradycardia with a rate of 49 incomplete right bundle branch block nonspecific ST-T changes similar to prior EKG.   [MB]    Clinical Course User Index [MB] Hayden Rasmussen, MD   I was informed by the nurse that the patient is a Geri psych bed search and so I have ordered her Covid testing.   Hayden Rasmussen, MD 12/27/18 [phone]

## 2018-12-27 NOTE — ED Notes (Signed)
Admitted to room 28. Writer introduced myself to her and she responded "why do I need to know that?" Offered her food and drink which she accepted. Sheriffs officer that accompanied her back to unit states he is very familiar with her and her family, get many calls to her address. She lives with her father, sister and brother n law and her brother. She is currently manic. She is IVC per her sister.
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
## 2018-12-27 NOTE — ED Notes (Signed)
Report called and given to [facility] charge nurse Gwyn at [facility]

## 2018-12-27 NOTE — ED Notes (Signed)
Trey with TTS called to do patients assessment. Rolled tele psych machine in to her room and introduced the process but she declined it say "I dont want to do it" Lytle Michaels informed and assessment tabled for now.

## 2018-12-27 NOTE — [facility] Counselor (Signed)
Per Collie Siad, RN pt is unwilling to engage in TTS assessment at this time. TTS to try again later.    Vertell Novak, [place], [facility], [facility] Triage Specialist [phone]

## 2018-12-27 NOTE — ED Notes (Signed)
Patient given graham crackers

## 2018-12-27 NOTE — ED Notes (Signed)
BLUE RED GOLD SAVE TUBE IN MAIN LAB

## 2018-12-27 NOTE — ED Notes (Signed)
Lab drawn per ED tech. Patient cooperative even joking with staff.

## 2018-12-27 NOTE — ED Notes (Signed)
Settled her into a geri chair, gave her a diet ginger ale. From her home meds appears she is a diabetic. Asked to leave and when writer told her she was IVC, became upset, states her sister is the devil and she is the one that needs to be here and when she leaves here she is taking papers out on her. She is weeping and sniffing loudly, upset.

## 2018-12-27 NOTE — ED Provider Notes (Signed)
[facility]-EMERGENCY DEPT Provider Note   CSN: [id number] Arrival date & time: 12/27/18  0158     History   Chief Complaint Chief Complaint  Patient presents with  . Suicidal    HPI Julie Morrow is a 68 y.o. female.     [facility] for aggressive behavior toward family member, unwilling to take her medication, being verbally abusive. The patient states she is angry at her sister, and that she is the one with the problem and needs to be here. She denies SI/HI/AVH.   The history is provided by the patient. No language interpreter was used.    Past Medical History:  Diagnosis Date  . Anxiety   . Bipolar 1 disorder (HCC)   . Bradycardia   . Depression   . Diabetes mellitus, type II (HCC)    Patient takes Glucotrol and Januvia  . Hypothyroidism   . Lithium toxicity     Patient Active Problem List   Diagnosis Date Noted  . PAD (peripheral artery disease) (HCC) 10/16/2018  . GAD (generalized anxiety disorder) 03/04/2018  . Insomnia 03/04/2018  . Situational depression 03/04/2018  . Bipolar disorder (HCC) 03/04/2018  . Acute kidney injury (HCC) 05/30/2017  . Bradycardia   . Lithium toxicity   . COPD GOLD 0 / still smoking  12/22/2016  . Generalized weakness 10/27/2015  . GERD (gastroesophageal reflux disease) 10/15/2015  . Bipolar 1 disorder, mixed, severe (HCC) 10/11/2015  . Diabetes type 2, controlled (HCC) 07/07/2015  . Morbid (severe) obesity due to excess calories (HCC) 07/07/2015  . Cigarette smoker 07/07/2015  . Hypothyroidism 07/07/2015  . HLD (hyperlipidemia) 11/07/2014  . Psoriasis 11/07/2014    Past Surgical History:  Procedure Laterality Date  . ABDOMINAL HYSTERECTOMY    . CHOLECYSTECTOMY       OB History   No obstetric history on file.      Home Medications    Prior to Admission medications   Medication Sig Start Date End Date Taking? Authorizing Provider  aspirin 81 MG  chewable tablet Chew 81 mg by mouth daily.    [provider]  citalopram (CELEXA) 20 MG tablet 1 qd 11/08/18   Cherie OuchHurst, Teresa T, PA-C  fenofibrate 160 MG tablet Take 1 tablet (160 mg total) by mouth daily with breakfast. Patient not taking: Reported on 10/16/2018 10/21/15   Jimmy FootmanHernandez-Gonzalez, Andrea, MD  glipiZIDE (GLUCOTROL) 10 MG tablet Take 1 tablet (10 mg total) by mouth 2 (two) times daily before a meal. 10/21/15   Jimmy FootmanHernandez-Gonzalez, Andrea, MD  levothyroxine (SYNTHROID, LEVOTHROID) 200 MCG tablet Take 1 tablet (200 mcg total) by mouth daily before breakfast. 06/01/17   Joseph ArtVann, Jessica U, DO  losartan (COZAAR) 25 MG tablet Take 25 mg by mouth daily. 09/14/18   [provider]  Melatonin 3-10 MG TABS Take 3-10 mg by mouth at bedtime as needed (sleep).     [provider]  multivitamin-iron-minerals-folic acid (CENTRUM) chewable tablet Chew 1 tablet by mouth daily.    [provider]  NYSTATIN powder Apply 1 application topically 2 (two) times daily as needed (rash).  09/06/18   [provider]  Oxcarbazepine (TRILEPTAL) 300 MG tablet Take 1 tablet (300 mg total) by mouth 3 (three) times daily. 11/08/18   Melony OverlyHurst, Teresa T, PA-C  QUEtiapine (SEROQUEL) 100 MG tablet 1 po qhs for 4 days, then 2 qhs. 12/12/18   Hurst, Glade Nurseeresa T, PA-C  Suvorexant (BELSOMRA) 20 MG TABS Take 20  mg by mouth at bedtime as needed (sleep). 11/08/18   Cherie OuchHurst, Teresa T, PA-C    Family History Family History  Problem Relation Age of Onset  . Dementia Mother   . Arthritis Father     Social History Social History   Tobacco Use  . Smoking status: Former Smoker    Packs/day: 0.50    Years: 20.00    Pack years: 10.00    Types: Cigarettes    Start date: 11/06/1984  . Smokeless tobacco: Never Used  Substance Use Topics  . Alcohol use: No    Alcohol/week: 0.0 standard drinks  . Drug use: No     Allergies   Patient has no known allergies.   Review of Systems Review of Systems   Constitutional: Negative for chills and fever.  HENT: Negative.   Respiratory: Negative.   Cardiovascular: Negative.   Gastrointestinal: Negative.   Musculoskeletal: Negative.   Skin: Negative.   Neurological: Negative.   Psychiatric/Behavioral:       Patient denies     Physical Exam Updated Vital Signs BP (!) 133/57 (BP Location: Left Arm)   Pulse 61   Temp 98.7 F (37.1 C) (Oral)   Resp 16   Ht 5\' 4"  (1.626 m)   Wt 108.9 kg   SpO2 93%   BMI 41.20 kg/m   Physical Exam Constitutional:      Appearance: She is well-developed.  Neck:     Musculoskeletal: Normal range of motion.  Pulmonary:     Effort: Pulmonary effort is normal.  Skin:    General: Skin is warm and dry.  Neurological:     General: No focal deficit present.     Mental Status: She is alert.  Psychiatric:        Mood and Affect: Affect is blunt and angry.        Behavior: Behavior is agitated.      ED Treatments / Results  Labs (all labs ordered are listed, but only abnormal results are displayed) Labs Reviewed  COMPREHENSIVE METABOLIC PANEL  ETHANOL  SALICYLATE LEVEL  ACETAMINOPHEN LEVEL  CBC  RAPID URINE DRUG SCREEN, HOSP PERFORMED    EKG None  Radiology No results found.  Procedures Procedures (including critical care time)  Medications Ordered in ED Medications - No data to display   Initial Impression / Assessment and Plan / ED Course  I have reviewed the triage vital signs and the nursing notes.  Pertinent labs & imaging results that were available during my care of the patient were reviewed by me and considered in my medical decision making (see chart for details).        Patient to ED under IVC for combative/aggressive/violent behavior, off medications.   2:30 - On exam she is agitated, angry affect. She denies having hurt anyone. TTS consultation required to determine disposition. Labs ordered.   4:00 - labs still have not been collected. Discussed with attending  nurse who will start the process.   4:50 - Charge nurse asked to help facilitate obtaining lab studies in order to medically clear.   5:45 - The patient is considered medically cleared. TTS has attempted evaluation unsuccessfully due to patient being uncooperative. Will re-attempt later.     Final Clinical Impressions(s) / ED Diagnoses   Final diagnoses:  None   1. Aggressive behavior 2. Noncompliance with medications 3. IVC  ED Discharge Orders    None       Elpidio AnisUpstill, Skyah Hannon, [place] JerseyPA-C 12/27/18 [id number]  Merryl Hacker, MD 12/27/18 [phone]

## 2018-12-27 NOTE — ED Notes (Signed)
Patient screaming and crying and cursing at staff. Patient reports she does not want to go to [facility]. PT screaming that she is innocent and the staff here are sinners.

## 2018-12-27 NOTE — BH Assessment (Signed)
[facility] Assessment Progress Note  Per [name], DO, this pt requires psychiatric hospitalization.  [name], Counselor reports that pt has been accepted to [facility] by [name], PMHNP to Rm 310.  Pt presents under IVC initiated by pt's sister, and upheld by Dr Norman, and IVC documents have been faxed to [phone].  Pt's nurse, [name], has been notified, and agrees to call report to [phone].  Pt is to be transported via [place] [facility].   [name], MA Behavioral Health Coordinator [phone]     

## 2018-12-27 NOTE — ED Notes (Signed)
TTS has performed their video assessment. Pt. Is awake and in no distress.

## 2018-12-28 DIAGNOSIS — F3110 Bipolar disorder, current episode manic without psychotic features, unspecified: Secondary | ICD-10-CM

## 2018-12-28 LAB — TSH: TSH: 20.696 u[IU]/mL — ABNORMAL HIGH (ref 0.350–4.500)

## 2018-12-28 LAB — GLUCOSE, CAPILLARY: Glucose-Capillary: 127 mg/dL — ABNORMAL HIGH (ref 70–99)

## 2018-12-28 LAB — LIPID PANEL
Cholesterol: 99 mg/dL (ref 0–200)
HDL: 28 mg/dL — ABNORMAL LOW (ref >40)
LDL Cholesterol: 38 mg/dL (ref 0–99)
Total CHOL/HDL Ratio: 3.5 RATIO
Triglycerides: 164 mg/dL — ABNORMAL HIGH (ref <150)
VLDL: 33 mg/dL (ref 0–40)

## 2018-12-28 LAB — HEMOGLOBIN A1C
Hgb A1c MFr Bld: 6.1 % — ABNORMAL HIGH (ref 4.8–5.6)
Mean Plasma Glucose: 128.37 mg/dL

## 2018-12-28 MED ORDER — RISPERIDONE 1 MG PO TBDP
2.0000 mg | ORAL_TABLET | Freq: Every day | ORAL | Status: DC
  Administered 2018-12-28: 2 mg via ORAL
  Filled 2018-12-28 (×2): qty 2

## 2018-12-28 NOTE — Progress Notes (Signed)
Admission Note:  68 yr female who presents IVC in no acute distress for the treatment of Mania. Per report patient was showing increased aggressive behaviors, speech is pressured, with flight of ideas, rapid speech and confusion. Patient appears blunted, her thoughts are disorganized to place and situation, she was calm and cooperative with admission process. Patient denies SI/HI/AVH, some confusion noted to situation and place, patient noted going to other peers rooms she was frequently redirected as needed.  Pt has Past medical Hx of DM, Anxiety, Bipolar Disorder, Depression. Patient's skin was assessed and found to be clear of any abnormal marks , she was searched and no contraband found, POC and unit policies explained and understanding verbalized. consents obtained. Food and fluids offered, and it was accepted. 15 minutes safety checks maintained will continue to monitor.

## 2018-12-28 NOTE — H&P (Signed)
Psychiatric Admission Assessment Adult  Patient Identification: Julie DamesSusan B Ucci MRN:  [id number] Date of Evaluation:  12/28/2018 Chief Complaint:  bipolar Principal Diagnosis: Bipolar I disorder with mania (HCC) Diagnosis:  Principal Problem:   Bipolar I disorder with mania (HCC) Active Problems:   HLD (hyperlipidemia)   Diabetes type 2, controlled (HCC)   Morbid (severe) obesity due to excess calories (HCC)   Hypothyroidism  History of Present Illness: This is a patient with a past history of bipolar disorder who was brought to the hospital by family with complaints that she had been aggressive at home.  In interview today the patient is hyperverbal.  She talks fast and her affect is labile and hard to follow.  She will tell long stories about the death of her father until she gets herself sobbing and then within seconds can be giggling and laughing again.  Patient admits that she is not taking any psychiatric medicine.  She says that she recently changed psychiatrists specifically so that she could come off of all of her psychiatric medicine.  Patient claims that she is sleeping and eating fine.  She claims that she is taking her regular medical medicines fine although the state of her TSH suggest that may not be true.  Denies alcohol or drug use.  Claims that her major stress is her sister who comes in and out of the house and who has been reportedly bothering her. Associated Signs/Symptoms: Depression Symptoms:  difficulty concentrating, anxiety, (Hypo) Manic Symptoms:  Distractibility, Flight of Ideas, Impulsivity, Irritable Mood, Labiality of Mood, Anxiety Symptoms:  Excessive Worry, Psychotic Symptoms:  Paranoia, PTSD Symptoms: Negative Total Time spent with patient: 1 hour  Past Psychiatric History: Patient has a history of bipolar disorder and has had multiple visits to psychiatry over the years.  It looks like until recently she was following up with a PA who was probation for.   Patient indicates that he stopped taking that medicine recently.  Looking back over the old chart she has been on multiple medicines over the years including multiple mood stabilizers and antipsychotics.  Patient claims that none of the medicine helped her.  By the documentation in the chart it looks like many antipsychotics were probably effective but she has been historically noncompliant.  [facility].  Discharged on Risperdal.  Not taking that either.  Admits to a suicide attempt decades ago.  Does not really deny violence.  Denies any history of substance abuse  Is the patient at risk to self? No.  Has the patient been a risk to self in the past 6 months? No.  Has the patient been a risk to self within the distant past? Yes.    Is the patient a risk to others? Yes.    Has the patient been a risk to others in the past 6 months? Yes.    Has the patient been a risk to others within the distant past? Yes.     Prior Inpatient Therapy:   Prior Outpatient Therapy:    Alcohol Screening: 1. How often do you have a drink containing alcohol?: Never 2. How many drinks containing alcohol do you have on a typical day when you are drinking?: 1 or 2 3. How often do you have six or more drinks on one occasion?: Never AUDIT-C Score: 0 4. How often during the last year have you found that you were not able to stop drinking once you had started?: Never 5.  How often during the last year have you failed to do what was normally expected from you becasue of drinking?: Never 6. How often during the last year have you needed a first drink in the morning to get yourself going after a heavy drinking session?: Never 7. How often during the last year have you had a feeling of guilt of remorse after drinking?: Never 8. How often during the last year have you been unable to remember what happened the night before because you had been drinking?: Never 9. Have you or  someone else been injured as a result of your drinking?: No 10. Has a relative or friend or a doctor or another health worker been concerned about your drinking or suggested you cut down?: No Alcohol Use Disorder Identification Test Final Score (AUDIT): 0 Alcohol Brief Interventions/Follow-up: AUDIT Score <7 follow-up not indicated Substance Abuse History in the last 12 months:  No. Consequences of Substance Abuse: Negative Previous Psychotropic Medications: Yes  Psychological Evaluations: Yes  Past Medical History:  Past Medical History:  Diagnosis Date  . Anxiety   . Bipolar 1 disorder (HCC)   . Bradycardia   . Depression   . Diabetes mellitus, type II (HCC)    Patient takes Glucotrol and Januvia  . Hypothyroidism   . Lithium toxicity     Past Surgical History:  Procedure Laterality Date  . ABDOMINAL HYSTERECTOMY    . CHOLECYSTECTOMY     Family History:  Family History  Problem Relation Age of Onset  . Dementia Mother   . Arthritis Father    Family Psychiatric  History: Patient denies there being any family history Tobacco Screening: Have you used any form of tobacco in the last 30 days? (Cigarettes, Smokeless Tobacco, Cigars, and/or Pipes): No Social History:  Social History   Substance and Sexual Activity  Alcohol Use No  . Alcohol/week: 0.0 standard drinks     Social History   Substance and Sexual Activity  Drug Use No    Additional Social History:                           Allergies:  No Known Allergies Lab Results:  Results for orders placed or performed during the hospital encounter of 12/27/18 (from the past 48 hour(s))  Glucose, capillary     Status: Abnormal   Collection Time: 12/27/18  7:33 PM  Result Value Ref Range   Glucose-Capillary 104 (H) 70 - 99 mg/dL  Hemoglobin Z6X     Status: Abnormal   Collection Time: 12/28/18  7:04 AM  Result Value Ref Range   Hgb A1c MFr Bld 6.1 (H) 4.8 - 5.6 %    Comment: (NOTE) Pre diabetes:           5.7%-6.4% Diabetes:              >6.4% Glycemic control for   <7.0% adults with diabetes    Mean Plasma Glucose 128.37 mg/dL    Comment: Performed at [facility], [address], [place], [place] [address]  Lipid panel     Status: Abnormal   Collection Time: 12/28/18  7:04 AM  Result Value Ref Range   Cholesterol 99 0 - 200 mg/dL   Triglycerides 540 (H) <150 mg/dL   HDL 28 (L) >98 mg/dL   Total CHOL/HDL Ratio 3.5 RATIO   VLDL 33 0 - 40 mg/dL   LDL Cholesterol 38 0 - 99 mg/dL  Comment:        Total Cholesterol/HDL:CHD Risk Coronary Heart Disease Risk Table                     Men   Women  1/2 Average Risk   3.4   3.3  Average Risk       5.0   4.4  2 X Average Risk   9.6   7.1  3 X Average Risk  23.4   11.0        Use the calculated Patient Ratio above and the CHD Risk Table to determine the patient's CHD Risk.        ATP III CLASSIFICATION (LDL):  <100     mg/dL   Optimal  100-129  mg/dL   Near or Above                    Optimal  130-159  mg/dL   Borderline  160-189  mg/dL   High  >190     mg/dL   Very High Performed at [facility], [address] [place], [place] [address]   TSH     Status: Abnormal   Collection Time: 12/28/18  7:04 AM  Result Value Ref Range   TSH 20.696 (H) 0.350 - 4.500 uIU/mL    Comment: Performed by a 3rd Generation assay with a functional sensitivity of <=0.01 uIU/mL. Performed at [facility], [address] [place],  [address]   Glucose, capillary     Status: Abnormal   Collection Time: 12/28/18  7:20 AM  Result Value Ref Range   Glucose-Capillary 127 (H) 70 - 99 mg/dL    Blood Alcohol level:  Lab Results  Component Value Date   ETH <10 12/27/2018   ETH <10 61/60/7371    Metabolic Disorder Labs:  Lab Results  Component Value Date   HGBA1C 6.1 (H) 12/28/2018   MPG 128.37 12/28/2018   MPG 119.76 05/30/2017   Lab Results  Component Value Date   PROLACTIN 15.1 07/03/2015   Lab Results   Component Value Date   CHOL 99 12/28/2018   TRIG 164 (H) 12/28/2018   HDL 28 (L) 12/28/2018   CHOLHDL 3.5 12/28/2018   VLDL 33 12/28/2018   LDLCALC 38 12/28/2018   LDLCALC 93 07/03/2015    Current Medications: Current Facility-Administered Medications  Medication Dose Route Frequency Provider Last Rate Last Dose  . acetaminophen (TYLENOL) tablet 650 mg  650 mg Oral Q6H PRN Suella Broad, FNP   650 mg at 12/28/18 0736  . alum & mag hydroxide-simeth (MAALOX/MYLANTA) 200-200-20 MG/5ML suspension 30 mL  30 mL Oral Q4H PRN Burt Ek, Gayland Curry, FNP      . aspirin chewable tablet 81 mg  81 mg Oral Daily Suella Broad, FNP   81 mg at 12/28/18 0736  . ciprofloxacin (CIPRO) tablet 500 mg  500 mg Oral BID Suella Broad, FNP   500 mg at 12/28/18 0737  . fenofibrate tablet 160 mg  160 mg Oral Q breakfast Suella Broad, FNP   160 mg at 12/28/18 0737  . glipiZIDE (GLUCOTROL) tablet 10 mg  10 mg Oral BID AC Suella Broad, FNP   10 mg at 12/28/18 0737  . hydrOXYzine (ATARAX/VISTARIL) tablet 25 mg  25 mg Oral TID PRN Suella Broad, FNP   25 mg at 12/27/18 2203  . levothyroxine (SYNTHROID) tablet 200 mcg  200 mcg Oral QAC breakfast Starkes-Perry, Gayland Curry, FNP  200 mcg at 12/28/18 0634  . losartan (COZAAR) tablet 25 mg  25 mg Oral Daily Maryagnes AmosStarkes-Perry, Takia S, FNP   25 mg at 12/28/18 0737  . magnesium hydroxide (MILK OF MAGNESIA) suspension 30 mL  30 mL Oral Daily PRN Maryagnes AmosStarkes-Perry, Takia S, FNP       PTA Medications: Medications Prior to Admission  Medication Sig Dispense Refill Last Dose  . fenofibrate 160 MG tablet Take 1 tablet (160 mg total) by mouth daily with breakfast. (Patient not taking: Reported on 10/16/2018) 30 tablet 0     Musculoskeletal: Strength & Muscle Tone: within normal limits Gait & Station: normal Patient leans: N/A  Psychiatric Specialty Exam: Physical Exam  Nursing note and vitals reviewed. Constitutional: She  appears well-developed and well-nourished.  HENT:  Head: Normocephalic and atraumatic.  Eyes: Pupils are equal, round, and reactive to light. Conjunctivae are normal.  Neck: Normal range of motion.  Cardiovascular: Regular rhythm and normal heart sounds.  Respiratory: Effort normal.  GI: Soft.  Musculoskeletal: Normal range of motion.  Neurological: She is alert.  Skin: Skin is warm and dry.  Psychiatric: Her affect is labile and inappropriate. Her speech is rapid and/or pressured and tangential. She is agitated. She is not aggressive. Thought content is paranoid. Cognition and memory are impaired. She expresses inappropriate judgment. She expresses no homicidal and no suicidal ideation.    Review of Systems  Constitutional: Negative.   HENT: Negative.   Eyes: Negative.   Respiratory: Negative.   Cardiovascular: Negative.   Gastrointestinal: Negative.   Musculoskeletal: Negative.   Skin: Negative.   Neurological: Negative.   Psychiatric/Behavioral: Negative for depression, hallucinations, memory loss, substance abuse and suicidal ideas. The patient is not nervous/anxious and does not have insomnia.     Blood pressure (!) 146/87, pulse (!) 50, temperature 97.8 F (36.6 C), temperature source Oral, resp. rate 18, height 5\' 4"  (1.626 m), weight 116.1 kg, SpO2 100 %.Body mass index is 43.94 kg/m.  General Appearance: Casual  Eye Contact:  Fair  Speech:  Pressured  Volume:  Increased  Mood:  Irritable  Affect:  Inappropriate and Labile  Thought Process:  Disorganized  Orientation:  Negative  Thought Content:  Illogical, Paranoid Ideation and Rumination  Suicidal Thoughts:  No  Homicidal Thoughts:  No  Memory:  Immediate;   Fair Recent;   Poor Remote;   Fair  Judgement:  Impaired  Insight:  Shallow  Psychomotor Activity:  Normal  Concentration:  Concentration: Poor  Recall:  [employer]is[employer]Fair  Fund of Knowledge:  Fair  Language:  Fair  Akathisia:  No  Handed:  Right  AIMS (if  indicated):     Assets:  Communication Skills Desire for Improvement Financial Resources/Insurance Housing Resilience Social Support  ADL's:  Impaired  Cognition:  Impaired,  Mild  Sleep:  Number of Hours: 3.25    Treatment Plan Summary: Daily contact with patient to assess and evaluate symptoms and progress in treatment, Medication management and Plan Patient with an established history of bipolar disorder presents with symptoms consistent with mania with mixed features.  Family reports she has been irritable and aggressive at home which is consistent with past behavior.  Patient has poor insight and poor understanding of her illness.  Review of labs indicates that her TSH is very elevated so she has probably not been on her thyroid medicine.  I offered the patient to restart Abilify which had been documented to be useful in the past.  Patient refused.  I suggested Risperdal  which [facility] and she was agreeable to that.  We will start her on Risperdal 2 mg at night.  Try to get collateral information.  Improved sleep work on calming down behavior and arranging outpatient treatment at discharge  Observation Level/Precautions:  15 minute checks  Laboratory:  UDS  Psychotherapy:    Medications:    Consultations:    Discharge Concerns:    Estimated LOS:  Other:     Physician Treatment Plan for Primary Diagnosis: Bipolar I disorder with mania (HCC) Long Term Goal(s): Improvement in symptoms so as ready for discharge  Short Term Goals: Ability to verbalize feelings will improve, Ability to disclose and discuss suicidal ideas and Ability to demonstrate self-control will improve  Physician Treatment Plan for Secondary Diagnosis: Principal Problem:   Bipolar I disorder with mania (HCC) Active Problems:   HLD (hyperlipidemia)   Diabetes type 2, controlled (HCC)   Morbid (severe) obesity due to excess calories (HCC)   Hypothyroidism  Long Term Goal(s):  Improvement in symptoms so as ready for discharge  Short Term Goals: Compliance with prescribed medications will improve  I certify that inpatient services furnished can reasonably be expected to improve the patient's condition.    Mordecai RasmussenJohn Clapacs, MD 8/13/20204:22 PM

## 2018-12-28 NOTE — BHH Suicide Risk Assessment (Signed)
[facility] Admission Suicide Risk Assessment   Nursing information obtained from:  Patient Demographic factors:  Unemployed, Low socioeconomic status, Caucasian Current Mental Status:  NA Loss Factors:  Decline in physical health, Decrease in vocational status Historical Factors:  Impulsivity Risk Reduction Factors:  Religious beliefs about death, Positive social support  Total Time spent with patient: 1 hour Principal Problem: Bipolar I disorder with mania ([place]) Diagnosis:  Principal Problem:   Bipolar I disorder with mania ([place]) Active Problems:   HLD (hyperlipidemia)   Diabetes type 2, controlled ([place])   Morbid (severe) obesity due to excess calories ([place])   Hypothyroidism  Subjective Data: Patient seen and chart reviewed.  Patient brought in with complaints of aggression towards family.  Patient has not made any suicidal statements.  She denies any suicidal ideation currently.  Patient is disorganized with poor insight and symptoms consistent with mixed mania.  Denies substance abuse.  Denies hopelessness  Continued Clinical Symptoms:  Alcohol Use Disorder Identification Test Final Score (AUDIT): 0 The "Alcohol Use Disorders Identification Test", Guidelines for Use in Primary Care, Second Edition.  World Pharmacologist [facility]). Score between 0-7:  no or low risk or alcohol related problems. Score between 8-15:  moderate risk of alcohol related problems. Score between 16-19:  high risk of alcohol related problems. Score 20 or above:  warrants further diagnostic evaluation for alcohol dependence and treatment.   CLINICAL FACTORS:   Bipolar Disorder:   Mixed State   Musculoskeletal: Strength & Muscle Tone: within normal limits Gait & Station: normal Patient leans: N/A  Psychiatric Specialty Exam: Physical Exam  Nursing note and vitals reviewed. Constitutional: She appears well-developed and well-nourished.  HENT:  Head: Normocephalic and atraumatic.  Eyes: Pupils are equal,  round, and reactive to light. Conjunctivae are normal.  Neck: Normal range of motion.  Cardiovascular: Regular rhythm and normal heart sounds.  Respiratory: Effort normal.  GI: Soft.  Musculoskeletal: Normal range of motion.  Neurological: She is alert.  Skin: Skin is warm and dry.  Psychiatric: Her affect is labile. Her speech is tangential. She is agitated. She is not aggressive. Thought content is paranoid. Cognition and memory are impaired. She expresses impulsivity. She expresses no homicidal and no suicidal ideation.    Review of Systems  Constitutional: Negative.   HENT: Negative.   Eyes: Negative.   Respiratory: Negative.   Cardiovascular: Negative.   Gastrointestinal: Negative.   Musculoskeletal: Negative.   Skin: Negative.   Neurological: Negative.   Psychiatric/Behavioral: Negative for depression, hallucinations, memory loss, substance abuse and suicidal ideas. The patient is nervous/anxious. The patient does not have insomnia.     Blood pressure (!) 146/87, pulse (!) 50, temperature 97.8 F (36.6 C), temperature source Oral, resp. rate 18, height 5\' 4"  (1.626 m), weight 116.1 kg, SpO2 100 %.Body mass index is 43.94 kg/m.  General Appearance: Casual  Eye Contact:  Fair  Speech:  Pressured  Volume:  Increased  Mood:  Anxious and Irritable  Affect:  Inappropriate and Labile  Thought Process:  Disorganized  Orientation:  Full (Time, Place, and Person)  Thought Content:  Illogical and Paranoid Ideation  Suicidal Thoughts:  No  Homicidal Thoughts:  No  Memory:  Immediate;   Fair Recent;   Poor Remote;   Fair  Judgement:  Impaired  Insight:  Shallow  Psychomotor Activity:  Normal  Concentration:  Concentration: Fair  Recall:  [employer] of Knowledge:  Fair  Language:  Fair  Akathisia:  No  Handed:  Right  AIMS (if indicated):     Assets:  Financial Resources/Insurance Housing Social Support  ADL's:  Impaired  Cognition:  Impaired,  Mild  Sleep:  Number of  Hours: 3.25      COGNITIVE FEATURES THAT CONTRIBUTE TO RISK:  Closed-mindedness    SUICIDE RISK:   Minimal: No identifiable suicidal ideation.  Patients presenting with no risk factors but with morbid ruminations; may be classified as minimal risk based on the severity of the depressive symptoms  PLAN OF CARE: Continue 15-minute checks.  Restart medication for bipolar disorder.  Engage in individual and group therapy.  Engage in treatment team evaluation.  Make plans for appropriate outpatient treatment and reassess suicidality  I certify that inpatient services furnished can reasonably be expected to improve the patient's condition.   Mordecai RasmussenJohn , MD 12/28/2018, 4:17 PM

## 2018-12-28 NOTE — Progress Notes (Signed)
Recreation Therapy Notes   Date: 12/28/2018  Time: 9:30 am  Location: Craft Room  Behavioral response: Appropriate  Intervention Topic: Happiness  Discussion/Intervention:  Group content today was focused on Happiness. The group defined happiness and described where happiness comes from. Individuals identified what makes them happy and how they go about making others happy. Patients expressed things that stop them from being happy and ways they can improve their happiness. The group stated reasons why it is important to be happy. The group participated in the intervention "My Happiness", where they had a chance to identify and express things that make them happy. Clinical Observations/Feedback:  Patient came to group and defined happiness as giving and being kind. She stated that happiness comes from within and the heart. Individual was social with peers and staff while participating in the intervention. Patient was pulled from group by Education officer, museum and never returned.  Julie Morrow LRT/CTRS         Julie Morrow 12/28/2018 11:46 AM
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
## 2018-12-28 NOTE — [facility] Suicide Risk Assessment (Signed)
[place] INPATIENT:  Family/Significant Other Suicide Prevention Education  Suicide Prevention Education:  Contact Attempts: [name], sister [phone] has been identified by the patient as the family member/significant other with whom the patient will be residing, and identified as the person(s) who will aid the patient in the event of a mental health crisis.  With written consent from the patient, two attempts were made to provide suicide prevention education, prior to and/or following the patient's discharge.  We were unsuccessful in providing suicide prevention education.  A suicide education pamphlet was given to the patient to share with family/significant other.  Date and time of first attempt:12/28/18 240pm mailbox full, unable to leave vm Date and time of second attempt: 2nd attempt needed  [place] 12/28/2018, 2:41 PM

## 2018-12-28 NOTE — B[facility] Group Notes (Signed)
Balance In Life 12/28/2018 1PM  Type of Therapy/Topic:  Group Therapy:  Balance in Life  Participation Level:  Active  Description of Group:   This group will address the concept of balance and how it feels and looks when one is unbalanced. Patients will be encouraged to process areas in their lives that are out of balance and identify reasons for remaining unbalanced. Facilitators will guide patients in utilizing problem-solving interventions to address and correct the stressor making their life unbalanced. Understanding and applying boundaries will be explored and addressed for obtaining and maintaining a balanced life. Patients will be encouraged to explore ways to assertively make their unbalanced needs known to significant others in their lives, using other group members and facilitator for support and feedback.  Therapeutic Goals: 1. Patient will identify two or more emotions or situations they have that consume much of in their lives. 2. Patient will identify signs/triggers that life has become out of balance:  3. Patient will identify two ways to set boundaries in order to achieve balance in their lives:  4. Patient will demonstrate ability to communicate their needs through discussion and/or role plays  Summary of Patient Progress: Pt interacted appropriately with group members. Pt says she enjoys spending time with her grandson and jumping on her trampoline as way to cope with life stressors. Pt demonstrated good insight.   Therapeutic Modalities:   Cognitive Behavioral Therapy Solution-Focused Therapy Assertiveness Training  Shamera Yarberry Lynelle Smoke, LCSW

## 2018-12-28 NOTE — Progress Notes (Signed)
Recreation Therapy Notes  INPATIENT RECREATION THERAPY ASSESSMENT  Patient Details Name: Julie Morrow MRN: [id number] DOB: [birth year] Today's Date: 12/28/2018       Information Obtained From: Patient  Able to Participate in Assessment/Interview: Yes  Patient Presentation: Responsive  Reason for Admission (Per Patient): Active Symptoms  Patient Stressors: Family  Coping Skills:   Music, Exercise, Prayer  Leisure Interests (2+):  Exercise - Walking, Games - Other (Comment), Sports - Basketball, Therapist, music - Hiking(Ping pong, Nutritional therapist)  Frequency of Recreation/Participation: Biomedical engineer of Community Resources:  Yes  Community Resources:  Park  Current Use:    If no, Barriers?:    Expressed Interest in [place] of Residence:  [place]  Patient Main Form of Transportation: Musician  Patient Strengths:  My faith  Patient Identified Areas of Improvement:  Loving others, Helping others  Patient Goal for Hospitalization:  Stop saying ugly words  Current SI (including self-harm):  No  Current HI:  No  Current AVH: No  Staff Intervention Plan: Group Attendance, Collaborate with Interdisciplinary Treatment Team  Consent to Intern Participation: N/A  Damichael Hofman 12/28/2018, 3:01 PM

## 2018-12-28 NOTE — Plan of Care (Signed)
Patient stated that she is doing "excellent."Patient is labile and continues to have pressure speech.Patient is appropriate with staff and peers.Denies SI,HI and AVH.Compliant with medications.Attended groups.Support and encouragement given.

## 2018-12-29 MED ORDER — BENZTROPINE MESYLATE 1 MG PO TABS
0.5000 mg | ORAL_TABLET | Freq: Every day | ORAL | Status: DC
  Administered 2018-12-29 – 2019-01-04 (×7): 0.5 mg via ORAL
  Filled 2018-12-29 (×6): qty 1

## 2018-12-29 MED ORDER — RISPERIDONE 1 MG PO TBDP
3.0000 mg | ORAL_TABLET | Freq: Every day | ORAL | Status: DC
  Administered 2018-12-29 – 2018-12-31 (×3): 3 mg via ORAL
  Filled 2018-12-29 (×2): qty 3

## 2018-12-29 MED ORDER — POLYVINYL ALCOHOL 1.4 % OP SOLN
1.0000 [drp] | Freq: Two times a day (BID) | OPHTHALMIC | Status: DC | PRN
  Administered 2018-12-29 – 2019-01-04 (×5): 1 [drp] via OPHTHALMIC
  Filled 2018-12-29: qty 15

## 2018-12-29 NOTE — Progress Notes (Signed)
Recreation Therapy Notes   Date: 12/29/2018  Time: 9:30 am  Location: Craft room  Behavioral response: Appropriate   Intervention Topic: Decision Making  Discussion/Intervention:  Group content today was focused on Decision making. The group defined decision making and some positive ways they make decisions for themselves. Individuals expressed reasons why they neglected any decision making in the past. Patients described ways to improve decision making skills in the future. The group explained what could happen if they did not do any decision making at all. Participants express how bad decision has affected them and others around them. Individual explained the importance of decision making. The group participated in the intervention "Making decisions" where they had a chance to discover some of their weaknesses and strengths in decision making. Patient came up with a new decision-making skill to improve themselves in the future.  Clinical Observations/Feedback:  Patient came to group and defined decision making as things you will or will not do. She stated decision making requires thinking. Participant explained that she likes to talk with her baby sister when she has to make decisions. Individual was social with peers and staff while participating in group.  Ferrah Panagopoulos LRT/CTRS         Jazzman Loughmiller 12/29/2018 11:04 AM

## 2018-12-29 NOTE — Progress Notes (Signed)
[facility]  12/29/2018 6:00 PM [name]  MRN:  [id number] Subjective: Follow-up patient with bipolar disorder.  Patient remains manic and agitated.  She is laughing constantly throughout the day.  Appears to be quite euphoric.  Came to treatment team and was a nonstop stream of jokes and hilarity.  Has not been aggressive to anyone but does of course dominate groups.  She has been cooperative with medication.  Insight still remains limited.  Blood pressure was low today. Principal Problem: Bipolar I disorder with mania (HCC) Diagnosis: Principal Problem:   Bipolar I disorder with mania (HCC) Active Problems:   HLD (hyperlipidemia)   Diabetes type 2, controlled (HCC)   Morbid (severe) obesity due to excess calories (HCC)   Hypothyroidism  Total Time spent with patient: 30 minutes  Past Psychiatric History: History of bipolar disorder with multiple hospitalizations including recent ones.  History of noncompliance.  Past Medical History:  Past Medical History:  Diagnosis Date  . Anxiety   . Bipolar 1 disorder (HCC)   . Bradycardia   . Depression   . Diabetes mellitus, type II (HCC)    Patient takes Glucotrol and Januvia  . Hypothyroidism   . Lithium toxicity     Past Surgical History:  Procedure Laterality Date  . ABDOMINAL HYSTERECTOMY    . CHOLECYSTECTOMY     Family History:  Family History  Problem Relation Age of Onset  . Dementia Mother   . Arthritis Father    Family Psychiatric  History: See previous Social History:  Social History   Substance and Sexual Activity  Alcohol Use No  . Alcohol/week: 0.0 standard drinks     Social History   Substance and Sexual Activity  Drug Use No    Social History   Socioeconomic History  . Marital status: Single    Spouse name: Not on file  . Number of children: Not on file  . Years of education: Not on file  . Highest education level: Not on file  Occupational History  . Not on file  Social Needs  .  Financial resource strain: Not on file  . Food insecurity    Worry: Not on file    Inability: Not on file  . Transportation needs    Medical: Not on file    Non-medical: Not on file  Tobacco Use  . Smoking status: Former Smoker    Packs/day: 0.50    Years: 20.00    Pack years: 10.00    Types: Cigarettes    Start date: 11/06/1984  . Smokeless tobacco: Never Used  Substance and Sexual Activity  . Alcohol use: No    Alcohol/week: 0.0 standard drinks  . Drug use: No  . Sexual activity: Never  Lifestyle  . Physical activity    Days per week: Not on file    Minutes per session: Not on file  . Stress: Not on file  Relationships  . Social Musicianconnections    Talks on phone: Not on file    Gets together: Not on file    Attends religious service: Not on file    Active member of club or organization: Not on file    Attends meetings of clubs or organizations: Not on file    Relationship status: Not on file  Other Topics Concern  . Not on file  Social History Narrative  . Not on file   Additional Social History:  Sleep: Fair  Appetite:  Fair  Current Medications: Current Facility-Administered Medications  Medication Dose Route Frequency Provider Last Rate Last Dose  . acetaminophen (TYLENOL) tablet 650 mg  650 mg Oral Q6H PRN Maryagnes AmosStarkes-Perry, Takia S, FNP   650 mg at 12/28/18 0736  . alum & mag hydroxide-simeth (MAALOX/MYLANTA) 200-200-20 MG/5ML suspension 30 mL  30 mL Oral Q4H PRN Rosario AdieStarkes-Perry, Juel Burrowakia S, FNP      . aspirin chewable tablet 81 mg  81 mg Oral Daily Maryagnes AmosStarkes-Perry, Takia S, FNP   81 mg at 12/29/18 0806  . benztropine (COGENTIN) tablet 0.5 mg  0.5 mg Oral QHS Ichael Pullara T, MD      . ciprofloxacin (CIPRO) tablet 500 mg  500 mg Oral BID Maryagnes AmosStarkes-Perry, Takia S, FNP   500 mg at 12/29/18 0806  . fenofibrate tablet 160 mg  160 mg Oral Q breakfast Maryagnes AmosStarkes-Perry, Takia S, FNP   160 mg at 12/29/18 0806  . glipiZIDE (GLUCOTROL) tablet 10 mg  10  mg Oral BID AC Maryagnes AmosStarkes-Perry, Takia S, FNP   10 mg at 12/29/18 1622  . hydrOXYzine (ATARAX/VISTARIL) tablet 25 mg  25 mg Oral TID PRN Maryagnes AmosStarkes-Perry, Takia S, FNP   25 mg at 12/27/18 2203  . levothyroxine (SYNTHROID) tablet 200 mcg  200 mcg Oral QAC breakfast Maryagnes AmosStarkes-Perry, Takia S, FNP   200 mcg at 12/29/18 0647  . magnesium hydroxide (MILK OF MAGNESIA) suspension 30 mL  30 mL Oral Daily PRN Starkes-Perry, Juel Burrowakia S, FNP      . polyvinyl alcohol (LIQUIFILM TEARS) 1.4 % ophthalmic solution 1 drop  1 drop Both Eyes BID PRN Zaraya Delauder T, MD      . risperiDONE (RISPERDAL M-TABS) disintegrating tablet 3 mg  3 mg Oral QHS Etola Mull, Jackquline DenmarkJohn T, MD        Lab Results:  Results for orders placed or performed during the hospital encounter of 12/27/18 (from the past 48 hour(s))  Glucose, capillary     Status: Abnormal   Collection Time: 12/27/18  7:33 PM  Result Value Ref Range   Glucose-Capillary 104 (H) 70 - 99 mg/dL  Hemoglobin [id number]     Status: Abnormal   Collection Time: 12/28/18  7:04 AM  Result Value Ref Range   Hgb A1c MFr Bld 6.1 (H) 4.8 - 5.6 %    Comment: (NOTE) Pre diabetes:          5.7%-6.4% Diabetes:              >6.4% Glycemic control for   <7.0% adults with diabetes    Mean Plasma Glucose 128.37 mg/dL    Comment: Performed at [facility], [address] [place], [place] [address]  Lipid panel     Status: Abnormal   Collection Time: 12/28/18  7:04 AM  Result Value Ref Range   Cholesterol 99 0 - 200 mg/dL   Triglycerides 540164 (H) <150 mg/dL   HDL 28 (L) >98>40 mg/dL   Total CHOL/HDL Ratio 3.5 RATIO   VLDL 33 0 - 40 mg/dL   LDL Cholesterol 38 0 - 99 mg/dL    Comment:        Total Cholesterol/HDL:CHD Risk Coronary Heart Disease Risk Table                     Men   Women  1/2 Average Risk   3.4   3.3  Average Risk       5.0   4.4  2 X Average Risk  9.6   7.1  3 X Average Risk  23.4   11.0        Use the calculated Patient Ratio above and the CHD Risk Table to determine  the patient's CHD Risk.        ATP III CLASSIFICATION (LDL):  <100     mg/dL   Optimal  100-129  mg/dL   Near or Above                    Optimal  130-159  mg/dL   Borderline  160-189  mg/dL   High  >190     mg/dL   Very High Performed at [facility], [address] [place], [place] [address]   TSH     Status: Abnormal   Collection Time: 12/28/18  7:04 AM  Result Value Ref Range   TSH 20.696 (H) 0.350 - 4.500 uIU/mL    Comment: Performed by a 3rd Generation assay with a functional sensitivity of <=0.01 uIU/mL. Performed at [facility], [address] [place], [place] [address]   Glucose, capillary     Status: Abnormal   Collection Time: 12/28/18  7:20 AM  Result Value Ref Range   Glucose-Capillary 127 (H) 70 - 99 mg/dL    Blood Alcohol level:  Lab Results  Component Value Date   ETH <10 12/27/2018   ETH <10 24/58/0998    Metabolic Disorder Labs: Lab Results  Component Value Date   HGBA1C 6.1 (H) 12/28/2018   MPG 128.37 12/28/2018   MPG 119.76 05/30/2017   Lab Results  Component Value Date   PROLACTIN 15.1 07/03/2015   Lab Results  Component Value Date   CHOL 99 12/28/2018   TRIG 164 (H) 12/28/2018   HDL 28 (L) 12/28/2018   CHOLHDL 3.5 12/28/2018   VLDL 33 12/28/2018   LDLCALC 38 12/28/2018   LDLCALC 93 07/03/2015    Physical Findings: AIMS: Facial and Oral Movements Muscles of Facial Expression: None, normal Lips and Perioral Area: None, normal Jaw: None, normal Tongue: None, normal,Extremity Movements Upper (arms, wrists, hands, fingers): None, normal Lower (legs, knees, ankles, toes): None, normal, Trunk Movements Neck, shoulders, hips: None, normal, Overall Severity Severity of abnormal movements (highest score from questions above): None, normal Incapacitation due to abnormal movements: None, normal Patient's awareness of abnormal movements (rate only patient's report): No Awareness, Dental Status Current problems with teeth  and/or dentures?: Yes Does patient usually wear dentures?: Yes  CIWA:  CIWA-Ar Total: 3 COWS:  COWS Total Score: 3  Musculoskeletal: Strength & Muscle Tone: within normal limits Gait & Station: normal Patient leans: N/A  Psychiatric Specialty Exam: Physical Exam  Nursing note and vitals reviewed. Constitutional: She appears well-developed and well-nourished.  HENT:  Head: Normocephalic and atraumatic.  Eyes: Pupils are equal, round, and reactive to light. Conjunctivae are normal.  Neck: Normal range of motion.  Cardiovascular: Regular rhythm and normal heart sounds.  Respiratory: Effort normal.  GI: Soft.  Musculoskeletal: Normal range of motion.  Neurological: She is alert.  Skin: Skin is warm and dry.  Psychiatric: Her affect is labile and inappropriate. Her speech is rapid and/or pressured and tangential. She is agitated. She is not aggressive. Cognition and memory are impaired. She expresses impulsivity. She expresses no homicidal and no suicidal ideation.    Review of Systems  Constitutional: Negative.   HENT: Negative.   Eyes: Negative.   Respiratory: Negative.   Cardiovascular: Negative.   Gastrointestinal: Negative.   Musculoskeletal:  Negative.   Skin: Negative.   Neurological: Negative.   Psychiatric/Behavioral: Negative for depression, hallucinations, memory loss, substance abuse and suicidal ideas. The patient is nervous/anxious and has insomnia.     Blood pressure (!) 91/42, pulse 62, temperature (!) 97.5 F (36.4 C), temperature source Oral, resp. rate 16, height 5\' 4"  (1.626 m), weight 116.1 kg, SpO2 100 %.Body mass index is 43.94 kg/m.  General Appearance: Casual  Eye Contact:  Good  Speech:  Pressured  Volume:  Increased  Mood:  Euphoric  Affect:  Congruent  Thought Process:  Disorganized  Orientation:  Full (Time, Place, and Person)  Thought Content:  Illogical, Rumination and Tangential  Suicidal Thoughts:  No  Homicidal Thoughts:  No  Memory:   Immediate;   Fair Recent;   Poor Remote;   Poor  Judgement:  Impaired  Insight:  Shallow  Psychomotor Activity:  Increased  Concentration:  Concentration: Poor  Recall:  Poor  Fund of Knowledge:  Poor  Language:  Fair  Akathisia:  No  Handed:  Right  AIMS (if indicated):     Assets:  Desire for Improvement  ADL's:  Impaired  Cognition:  Impaired,  Mild  Sleep:  Number of Hours: 4.75     Treatment Plan Summary: Daily contact with patient to assess and evaluate symptoms and progress in treatment, Medication management and Plan She is still showing signs of mania.  Appears to tolerate medicine well.  Increase Risperdal to 3 mg at night.  Patient is requesting eyedrops saying that she has dry eyes.  She uses some sort of product she gets over-the-counter.  I have ordered artificial tears to be used as needed.  Add small dose of Cogentin along with the Risperdal at nighttime.  Continue to monitor for improvement in mania.  Mordecai RasmussenJohn Janiylah Hannis, MD 12/29/2018, 6:00 PM

## 2018-12-29 NOTE — BHH Group Notes (Signed)
LCSW Group Therapy Note  12/29/2018 1:00 PM  Type of Therapy and Topic:  Group Therapy:  Feelings around Relapse and Recovery  Participation Level:  Active   Description of Group:    Patients in this group will discuss emotions they experience before and after a relapse. They will process how experiencing these feelings, or avoidance of experiencing them, relates to having a relapse. Facilitator will guide patients to explore emotions they have related to recovery. Patients will be encouraged to process which emotions are more powerful. They will be guided to discuss the emotional reaction significant others in their lives may have to their relapse or recovery. Patients will be assisted in exploring ways to respond to the emotions of others without this contributing to a relapse.  Therapeutic Goals: 1. Patient will identify two or more emotions that lead to a relapse for them 2. Patient will identify two emotions that result when they relapse 3. Patient will identify two emotions related to recovery 4. Patient will demonstrate ability to communicate their needs through discussion and/or role plays   Summary of Patient Progress: Patient was present in group. Patient was attentive and supportive of other group members. Patient shred how a lack of trust with providers can lead to a relapse. She shared how relapse can lead to a feeling of shame or guilt, sadness, anger and/or letting the family down.  She shared how feelings of joy, happiness, freeing, motivation and love are associated with recovery.  She shared that she hikes, jogs, jumps on trampoline and reads books on depression or anxiety to increase her understanding when she is depressed.   Therapeutic Modalities:   Cognitive Behavioral Therapy Solution-Focused Therapy Assertiveness Training Relapse Prevention Therapy   Assunta Curtis, MSW, LCSW 12/29/2018 12:15 PM

## 2018-12-29 NOTE — BHH Suicide Risk Assessment (Signed)
[place] INPATIENT:  Family/Significant Other Suicide Prevention Education  Suicide Prevention Education:  Education Completed; Julie [facility]row, sister [phone] has been identified by the patient as the family member/significant other with whom the patient will be residing, and identified as the person(s) who will aid the patient in the event of a mental health crisis (suicidal ideations/suicide attempt).  With written consent from the patient, the family member/significant other has been provided the following suicide prevention education, prior to the and/or following the discharge of the patient.  The suicide prevention education provided includes the following:  Suicide risk factors  Suicide prevention and interventions  National Suicide Hotline telephone number  [facility] assessment telephone number  [facility] Emergency Assistance [place] and/or Residential Mobile Crisis Unit telephone number  Request made of family/significant other to:  Remove weapons (e.g., guns, rifles, knives), all items previously/currently identified as safety concern.    Remove drugs/medications (over-the-counter, prescriptions, illicit drugs), all items previously/currently identified as a safety concern.  The family member/significant other verbalizes understanding of the suicide prevention education information provided.  The family member/significant other agrees to remove the items of safety concern listed above. Julie Morrow reports the pt was brought to the hospital because she has been having difficulty sleeping at night. She says the patient lives with their father and can return to the home. Julie Morrow reports the pt was last seen at [facility] in [place] but thinks she is seeing a new psychiatrist but is unable to recall the name.  She denies the patient having access to guns or weapons in the home.  Julie Morrow T Amedeo Detweiler 12/29/2018, 2:06 PM

## 2018-12-29 NOTE — Tx Team (Addendum)
Interdisciplinary Treatment and Diagnostic Plan Update  12/29/2018 Time of Session: [name] MRN: [id number]  Principal Diagnosis: Bipolar I disorder with mania ([name])  Secondary Diagnoses: Principal Problem:   Bipolar I disorder with mania ([name]) Active Problems:   HLD (hyperlipidemia)   Diabetes type 2, controlled ([name])   Morbid (severe) obesity due to excess calories ([name])   Hypothyroidism   Current Medications:  Current Facility-Administered Medications  Medication Dose Route Frequency Provider Last Rate Last Dose  . acetaminophen (TYLENOL) tablet 650 mg  650 mg Oral Q6H PRN Suella Broad, FNP   650 mg at 12/28/18 0736  . alum & mag hydroxide-simeth (MAALOX/MYLANTA) 200-200-20 MG/5ML suspension 30 mL  30 mL Oral Q4H PRN Burt Ek, Gayland Curry, FNP      . aspirin chewable tablet 81 mg  81 mg Oral Daily Suella Broad, FNP   81 mg at 12/29/18 0806  . benztropine (COGENTIN) tablet 0.5 mg  0.5 mg Oral QHS Clapacs, John T, MD      . ciprofloxacin (CIPRO) tablet 500 mg  500 mg Oral BID Suella Broad, FNP   500 mg at 12/29/18 0806  . fenofibrate tablet 160 mg  160 mg Oral Q breakfast Suella Broad, FNP   160 mg at 12/29/18 0806  . glipiZIDE (GLUCOTROL) tablet 10 mg  10 mg Oral BID AC Suella Broad, FNP   10 mg at 12/29/18 0806  . hydrOXYzine (ATARAX/VISTARIL) tablet 25 mg  25 mg Oral TID PRN Suella Broad, FNP   25 mg at 12/27/18 2203  . levothyroxine (SYNTHROID) tablet 200 mcg  200 mcg Oral QAC breakfast Suella Broad, FNP   200 mcg at 12/29/18 0647  . losartan (COZAAR) tablet 25 mg  25 mg Oral Daily Suella Broad, FNP   25 mg at 12/29/18 0806  . magnesium hydroxide (MILK OF MAGNESIA) suspension 30 mL  30 mL Oral Daily PRN Starkes-Perry, Gayland Curry, FNP      . polyvinyl alcohol (LIQUIFILM TEARS) 1.4 % ophthalmic solution 1 drop  1 drop Both Eyes BID PRN Clapacs, John T, MD      . risperiDONE (RISPERDAL M-TABS)  disintegrating tablet 3 mg  3 mg Oral QHS Clapacs, John T, MD       PTA Medications: Medications Prior to Admission  Medication Sig Dispense Refill Last Dose  . fenofibrate 160 MG tablet Take 1 tablet (160 mg total) by mouth daily with breakfast. (Patient not taking: Reported on 10/16/2018) 30 tablet 0     Patient Stressors: Health problems Marital or family conflict Medication change or noncompliance  Patient Strengths: Armed forces logistics/support/administrative officer Supportive family/friends  Treatment Modalities: Medication Management, Group therapy, Case management,  1 to 1 session with clinician, Psychoeducation, Recreational therapy.   Physician Treatment Plan for Primary Diagnosis: Bipolar I disorder with mania ([name]) Long Term Goal(s): Improvement in symptoms so as ready for discharge Improvement in symptoms so as ready for discharge   Short Term Goals: Ability to verbalize feelings will improve Ability to disclose and discuss suicidal ideas Ability to demonstrate self-control will improve Compliance with prescribed medications will improve  Medication Management: Evaluate patient's response, side effects, and tolerance of medication regimen.  Therapeutic Interventions: 1 to 1 sessions, Unit Group sessions and Medication administration.  Evaluation of Outcomes: Not Met  Physician Treatment Plan for Secondary Diagnosis: Principal Problem:   Bipolar I disorder with mania ([name]) Active Problems:   HLD (hyperlipidemia)   Diabetes type 2, controlled ([name])  Morbid (severe) obesity due to excess calories ([name])   Hypothyroidism  Long Term Goal(s): Improvement in symptoms so as ready for discharge Improvement in symptoms so as ready for discharge   Short Term Goals: Ability to verbalize feelings will improve Ability to disclose and discuss suicidal ideas Ability to demonstrate self-control will improve Compliance with prescribed medications will improve     Medication Management: Evaluate  patient's response, side effects, and tolerance of medication regimen.  Therapeutic Interventions: 1 to 1 sessions, Unit Group sessions and Medication administration.  Evaluation of Outcomes: Not Met   RN Treatment Plan for Primary Diagnosis: Bipolar I disorder with mania ([name]) Long Term Goal(s): Knowledge of disease and therapeutic regimen to maintain health will improve  Short Term Goals: Ability to demonstrate self-control, Ability to participate in decision making will improve, Ability to verbalize feelings will improve, Ability to identify and develop effective coping behaviors will improve and Compliance with prescribed medications will improve  Medication Management: RN will administer medications as ordered by provider, will assess and evaluate patient's response and provide education to patient for prescribed medication. RN will report any adverse and/or side effects to prescribing provider.  Therapeutic Interventions: 1 on 1 counseling sessions, Psychoeducation, Medication administration, Evaluate responses to treatment, Monitor vital signs and CBGs as ordered, Perform/monitor CIWA, COWS, AIMS and Fall Risk screenings as ordered, Perform wound care treatments as ordered.  Evaluation of Outcomes: Not Met   LCSW Treatment Plan for Primary Diagnosis: Bipolar I disorder with mania ([name]) Long Term Goal(s): Safe transition to appropriate next level of care at discharge, Engage patient in therapeutic group addressing interpersonal concerns.  Short Term Goals: Engage patient in aftercare planning with referrals and resources  Therapeutic Interventions: Assess for all discharge needs, 1 to 1 time with Social worker, Explore available resources and support systems, Assess for adequacy in community support network, Educate family and significant other(s) on suicide prevention, Complete Psychosocial Assessment, Interpersonal group therapy.  Evaluation of Outcomes: Not Met   Progress in  Treatment: Attending groups: Yes. Participating in groups: Yes. Taking medication as prescribed: Yes. Toleration medication: Yes. Family/Significant other contact made: Yes, individual(s) contacted:  [name], sister Patient understands diagnosis: No. Discussing patient identified problems/goals with staff: Yes. Medical problems stabilized or resolved: No. Denies suicidal/homicidal ideation: Yes. Issues/concerns per patient self-inventory: No. Other: NA  New problem(s) identified: No, Describe:  none reported  New Short Term/Long Term Goal(s):Attend outpatient treatment, take medication as prescribed, develop and implement healthy coping methods  Patient Goals:  "To love one another and respect everybody"  Discharge Plan or Barriers: D/C plan TBD, pt unable to identify name of provider.  Reason for Continuation of Hospitalization: Mania Medication stabilization  Estimated Length of Stay:5-7 day  Recreational Therapy: Patient: N/A Patient Goal: Patient will identify 3 positive coping skills strategies to use post d/c within 5 recreation therapy group  Sessions  Attendees: Patient:Julie [facility]row 12/29/2018 3:16 PM  Physician: Alethia Berthold 12/29/2018 3:16 PM  Nursing: Junita Push New [place] 12/29/2018 3:16 PM  RN Care Manager: 12/29/2018 3:16 PM  Social Worker: Anise Salvo 12/29/2018 3:16 PM  Recreational Therapist: Roanna Epley 12/29/2018 3:16 PM  Other:  12/29/2018 3:16 PM  Other:  12/29/2018 3:16 PM  Other: 12/29/2018 3:16 PM    Scribe for Treatment Team: Yvette Rack, LCSW 12/29/2018 3:16 PM

## 2018-12-29 NOTE — Progress Notes (Signed)
Care transferred to [place], charge Rn.

## 2018-12-29 NOTE — [facility] Suicide Risk Assessment (Signed)
[facility] INPATIENT:  Family/Significant Other Suicide Prevention Education  Suicide Prevention Education:  [place], sister [phone]  has been identified by the patient as the family member/significant other with whom the patient will be residing, and identified as the person(s) who will aid the patient in the event of a mental health crisis.  With written consent from the patient, two attempts were made to provide suicide prevention education, prior to and/or following the patient's discharge.  We were unsuccessful in providing suicide prevention education.  A suicide education pamphlet was given to the patient to share with family/significant other.  Date and time of first attempt:12/28/18 240pm Date and time of second attempt:8/14 11:10am left confidential vm, awaiting response  Julie [facility]row T Kayti Poss 12/29/2018, 11:13 AM

## 2018-12-29 NOTE — Progress Notes (Signed)
Patient noted on the unit, alert and oriented x 3 with periods of confusion to situation, while on the unit making a phone call, she accidental fell on the floor, no head injuries noted, PERRLA, ROME x 4, no abnormal external rotation of limbs, hand grips are strong, assisted off the floor and placed in a wheel chair. 15 minutes safety checks maintained will continue to monitor.

## 2018-12-29 NOTE — Plan of Care (Signed)
Patient is interacting and appropriate with staff and peers.Patient stated that her goal for today is to love all and respect each other.Patient ambulates with no difficulty.Patient enjoyed being outside.Compliant with medications.Attended groups.Appetite and energy level good.Support and encouragement given.

## 2018-12-29 NOTE — Progress Notes (Signed)
Patient complaint with medication regimen tonight, interacting appropriately with peers and staff, no distress noted, affect is blunted, mood is pleasant, denies pain or discomfort. Patient was offered support and encouragement, 15 minutes safety checks maintained

## 2018-12-29 NOTE — Tx Team (Signed)
Initial Treatment Plan 12/29/2018 3:57 AM Boykin Nearing JTT:[id number]    PATIENT STRESSORS: Health problems Marital or family conflict Medication change or noncompliance   PATIENT STRENGTHS: Communication skills Supportive family/friends   PATIENT IDENTIFIED PROBLEMS: Mania     Depression                  DISCHARGE CRITERIA:  Improved stabilization in mood, thinking, and/or behavior Motivation to continue treatment in a less acute level of care  PRELIMINARY DISCHARGE PLAN: Outpatient therapy Participate in family therapy  PATIENT/FAMILY INVOLVEMENT: This treatment plan has been presented to and reviewed with the patient, Julie Morrow,  The patient and family have been given the opportunity to ask questions and make suggestions.  Harl Bowie, RN 12/29/2018, 3:57 AM

## 2018-12-30 NOTE — Plan of Care (Signed)
  Problem: Activity: Goal: Will verbalize the importance of balancing activity with adequate rest periods Outcome: Not Progressing  Patient verbalized importance of rest and activity.

## 2018-12-30 NOTE — Plan of Care (Signed)
Patient  aware of [place] education and unit programing , verbalize understanding .  Emotional and mental status improved .  Attending unit programing  voice no concerns around sleep or wake cycle.   Continue to work on coping  decision making and anxiety  concerns . Patient denies suicidal ideations. Inform of follow  up  appointment at discharge  Patient voice of no safety concerns . Compliant  with medication .  No anger management  concerns . Problem: Education: Goal: Knowledge of [place] [facility] Education information/materials will improve Outcome: Progressing Goal: Emotional status will improve Outcome: Progressing Goal: Mental status will improve Outcome: Progressing Goal: Verbalization of understanding the information provided will improve Outcome: Progressing   Problem: Activity: Goal: Interest or engagement in activities will improve Outcome: Progressing Goal: Sleeping patterns will improve Outcome: Progressing   Problem: Coping: Goal: Ability to verbalize frustrations and anger appropriately will improve Outcome: Progressing Goal: Ability to demonstrate self-control will improve Outcome: Progressing   Problem: Health Behavior/Discharge Planning: Goal: Identification of resources available to assist in meeting health care needs will improve Outcome: Progressing Goal: Compliance with treatment plan for underlying cause of condition will improve Outcome: Progressing   Problem: Physical Regulation: Goal: Ability to maintain clinical measurements within normal limits will improve Outcome: Progressing   Problem: Safety: Goal: Periods of time without injury will increase Outcome: Progressing   Problem: Activity: Goal: Will verbalize the importance of balancing activity with adequate rest periods Outcome: Progressing   Problem: Coping: Goal: Coping ability will improve Outcome: Progressing Goal: Will verbalize feelings Outcome: Progressing   Problem: Health  Behavior/Discharge Planning: Goal: Compliance with prescribed medication regimen will improve Outcome: Progressing   Problem: Nutritional: Goal: Ability to achieve adequate nutritional intake will improve Outcome: Progressing   Problem: Safety: Goal: Ability to redirect hostility and anger into socially appropriate behaviors will improve Outcome: Progressing Goal: Ability to remain free from injury will improve Outcome: Progressing   Problem: Self-Care: Goal: Ability to participate in self-care as condition permits will improve Outcome: Progressing   Problem: Self-Concept: Goal: Will verbalize positive feelings about self Outcome: Progressing

## 2018-12-30 NOTE — Progress Notes (Signed)
D: Patient stated slept good last night .Stated appetite good and energy level  normal. Stated concentration good . Stated on Depression scale 0 , hopeless 0 and anxiety 0 .( low 0-10 high) Denies suicidal  homicidal ideations  .  No auditory hallucinations  No pain concerns . Appropriate ADL'S. Interacting with peers and staff. Patient  aware of [place] education and unit programing , verbalize understanding .  Emotional and mental status improved .  Attending unit programing  voice no concerns around sleep or wake cycle.   Continue to work on coping  decision making and anxiety  concerns . Patient denies suicidal ideations. Inform of follow  up  appointment at discharge  Patient voice of no safety concerns . Compliant  with medication .  No anger management  concerns .  A: Encourage patient participation with unit programming . Instruction  Given on  Medication , verbalize understanding.  R: Voice no other concerns. Staff continue to monitor

## 2018-12-30 NOTE — Progress Notes (Signed)
[facility]  12/30/2018 2:50 PM Julie DamesSusan B Morrow  MRN:  [id number]  [name] is a 68yo F with psych h/o bipolar disorder, [facility]. Patient seen.  Chart reviewed. Patient discussed with nursing; no overnight events reported.  Subjective: Patient observed in her room washing hospital socks, she explained that she does not trust nurses in washing her clothes. She reports "doing great'. She stats she takes "all medications here" and explains that she has medications for thyroid, blood pressure and blood sugar. She states she does not have medications for mental issues because "I don`t have mental issues". She reports "my sister sent me here, but I am fine". She denies suicidal or homicidal thoughts. Denies any current mental or physical complaints.   Principal Problem: Bipolar I disorder with mania (HCC) Diagnosis: Principal Problem:   Bipolar I disorder with mania (HCC) Active Problems:   HLD (hyperlipidemia)   Diabetes type 2, controlled (HCC)   Morbid (severe) obesity due to excess calories (HCC)   Hypothyroidism  Total Time spent with patient: 15 minutes  Past Psychiatric History: see H&P  Past Medical History:  Past Medical History:  Diagnosis Date  . Anxiety   . Bipolar 1 disorder (HCC)   . Bradycardia   . Depression   . Diabetes mellitus, type II (HCC)    Patient takes Glucotrol and Januvia  . Hypothyroidism   . Lithium toxicity     Past Surgical History:  Procedure Laterality Date  . ABDOMINAL HYSTERECTOMY    . CHOLECYSTECTOMY     Family History:  Family History  Problem Relation Age of Onset  . Dementia Mother   . Arthritis Father    Family Psychiatric  History: see H&P Social History:  Social History   Substance and Sexual Activity  Alcohol Use No  . Alcohol/week: 0.0 standard drinks     Social History   Substance and Sexual Activity  Drug Use No    Social History   Socioeconomic History  .  Marital status: Single    Spouse name: Not on file  . Number of children: Not on file  . Years of education: Not on file  . Highest education level: Not on file  Occupational History  . Not on file  Social Needs  . Financial resource strain: Not on file  . Food insecurity    Worry: Not on file    Inability: Not on file  . Transportation needs    Medical: Not on file    Non-medical: Not on file  Tobacco Use  . Smoking status: Former Smoker    Packs/day: 0.50    Years: 20.00    Pack years: 10.00    Types: Cigarettes    Start date: 11/06/1984  . Smokeless tobacco: Never Used  Substance and Sexual Activity  . Alcohol use: No    Alcohol/week: 0.0 standard drinks  . Drug use: No  . Sexual activity: Never  Lifestyle  . Physical activity    Days per week: Not on file    Minutes per session: Not on file  . Stress: Not on file  Relationships  . Social Musicianconnections    Talks on phone: Not on file    Gets together: Not on file    Attends religious service: Not on file    Active member of club or organization: Not on file    Attends meetings of clubs or organizations: Not on file  Relationship status: Not on file  Other Topics Concern  . Not on file  Social History Narrative  . Not on file   Additional Social History:                         Sleep: Good  Appetite:  Good  Current Medications: Current Facility-Administered Medications  Medication Dose Route Frequency Provider Last Rate Last Dose  . acetaminophen (TYLENOL) tablet 650 mg  650 mg Oral Q6H PRN Suella Broad, FNP   650 mg at 12/28/18 0736  . alum & mag hydroxide-simeth (MAALOX/MYLANTA) 200-200-20 MG/5ML suspension 30 mL  30 mL Oral Q4H PRN Burt Ek, Gayland Curry, FNP      . aspirin chewable tablet 81 mg  81 mg Oral Daily Suella Broad, FNP   81 mg at 12/30/18 2595  . benztropine (COGENTIN) tablet 0.5 mg  0.5 mg Oral QHS Clapacs, John T, MD   0.5 mg at 12/29/18 2144  . fenofibrate  tablet 160 mg  160 mg Oral Q breakfast Suella Broad, FNP   160 mg at 12/30/18 6387  . glipiZIDE (GLUCOTROL) tablet 10 mg  10 mg Oral BID AC Suella Broad, FNP   10 mg at 12/30/18 5643  . hydrOXYzine (ATARAX/VISTARIL) tablet 25 mg  25 mg Oral TID PRN Suella Broad, FNP   25 mg at 12/27/18 2203  . levothyroxine (SYNTHROID) tablet 200 mcg  200 mcg Oral QAC breakfast Suella Broad, FNP   200 mcg at 12/30/18 3295  . magnesium hydroxide (MILK OF MAGNESIA) suspension 30 mL  30 mL Oral Daily PRN Starkes-Perry, Gayland Curry, FNP      . polyvinyl alcohol (LIQUIFILM TEARS) 1.4 % ophthalmic solution 1 drop  1 drop Both Eyes BID PRN Clapacs, Madie Reno, MD   1 drop at 12/30/18 1106  . risperiDONE (RISPERDAL M-TABS) disintegrating tablet 3 mg  3 mg Oral QHS Clapacs, Madie Reno, MD   3 mg at 12/29/18 2141    Lab Results: No results found for this or any previous visit (from the past 48 hour(s)).  Blood Alcohol level:  Lab Results  Component Value Date   ETH <10 12/27/2018   ETH <10 18/84/1660    Metabolic Disorder Labs: Lab Results  Component Value Date   HGBA1C 6.1 (H) 12/28/2018   MPG 128.37 12/28/2018   MPG 119.76 05/30/2017   Lab Results  Component Value Date   PROLACTIN 15.1 07/03/2015   Lab Results  Component Value Date   CHOL 99 12/28/2018   TRIG 164 (H) 12/28/2018   HDL 28 (L) 12/28/2018   CHOLHDL 3.5 12/28/2018   VLDL 33 12/28/2018   LDLCALC 38 12/28/2018   LDLCALC 93 07/03/2015    Physical Findings: AIMS: Facial and Oral Movements Muscles of Facial Expression: None, normal Lips and Perioral Area: None, normal Jaw: None, normal Tongue: None, normal,Extremity Movements Upper (arms, wrists, hands, fingers): None, normal Lower (legs, knees, ankles, toes): None, normal, Trunk Movements Neck, shoulders, hips: None, normal, Overall Severity Severity of abnormal movements (highest score from questions above): None, normal Incapacitation due to abnormal  movements: None, normal Patient's awareness of abnormal movements (rate only patient's report): No Awareness, Dental Status Current problems with teeth and/or dentures?: Yes Does patient usually wear dentures?: Yes  CIWA:  CIWA-Ar Total: 3 COWS:  COWS Total Score: 3  Musculoskeletal: Strength & Muscle Tone: within normal limits Gait & Station: normal Patient leans: N/A  Psychiatric Specialty Exam:  Physical Exam  ROS  Blood pressure 129/72, pulse 77, temperature 97.6 F (36.4 C), temperature source Oral, resp. rate 18, height 5\' 4"  (1.626 m), weight 116.1 kg, SpO2 97 %.Body mass index is 43.94 kg/m.  General Appearance: Bizarre  Eye Contact:  Good  Speech:  Pressured  Volume:  Normal  Mood:  Euphoric  Affect:  Congruent  Thought Process:  Coherent  Orientation:  Full (Time, Place, and Person)  Thought Content:  Illogical  Suicidal Thoughts:  No  Homicidal Thoughts:  No  Memory:  Immediate;   Fair Recent;   Fair Remote;   Fair  Judgement:  Impaired  Insight:  Lacking  Psychomotor Activity:  Increased  Concentration:  Concentration: Fair and Attention Span: Fair  Recall:  [employer]  Fund of Knowledge:  Fair  Language:  Good  Akathisia:  No  Handed:  Right  AIMS (if indicated):     Assets:  Housing Social Support  ADL's:  Intact  Cognition:  Impaired,  Mild  Sleep:  Number of Hours: 3.15     Treatment Plan Summary: Daily contact with patient to assess and evaluate symptoms and progress in treatment   Patient still appears in manic state - she is hyperactive, slept 3 hours, involved in goal-directed activity, euphoric, her speech is pressured, she has no insight and poor judgement. The dose of antipsychotic was just increased and the plan is to switch her to LAI eventually.  Impression: Bipolar disorder, current episode - mania.  Plan:  -continue inpatient psych admission; 15-minute checks; daily contact with patient to assess and evaluate symptoms and progress in  treatment; psychoeducation.  -continue scheduled psych medications: Risperidone 3mg  PO QHS, Cogentin 0.5mg  PO QHS.  -continue PRN medications.  -Disposition: patient will be d/c home with outpatient psych f/u when stable. She is not ready for d/c yet.  Thalia PartyAlisa Laiyla Slagel, MD 12/30/2018, 2:50 PM

## 2018-12-30 NOTE — Progress Notes (Signed)
Patient alert and oriented x 4, affect is blunted , thoughts are disorganized, speech is tangential, she is S/P Fall no injuries noted, propels herself on unit in a wheelchair, interacting appropriately with peers and staff. Patient denies SI/HI/AVH, complaint with medication regimen, 15 minutes safety checks maintained will continue to monitor.

## 2018-12-30 NOTE — [facility] Group Notes (Signed)
LCSW Group Therapy Note   12/30/2018 1:15pm   Type of Therapy and Topic:  Group Therapy:  Trust and Honesty  Participation Level:  Active  Description of Group:    In this group patients will be asked to explore the value of being honest.  Patients will be guided to discuss their thoughts, feelings, and behaviors related to honesty and trusting in others. Patients will process together how trust and honesty relate to forming relationships with peers, family members, and self. Each patient will be challenged to identify and express feelings of being vulnerable. Patients will discuss reasons why people are dishonest and identify alternative outcomes if one was truthful (to self or others). This group will be process-oriented, with patients participating in exploration of their own experiences, giving and receiving support, and processing challenge from other group members.   Therapeutic Goals: 1. Patient will identify why honesty is important to relationships and how honesty overall affects relationships.  2. Patient will identify a situation where they lied or were lied too and the  feelings, thought process, and behaviors surrounding the situation 3. Patient will identify the meaning of being vulnerable, how that feels, and how that correlates to being honest with self and others. 4. Patient will identify situations where they could have told the truth, but instead lied and explain reasons of dishonesty.   Summary of Patient Progress The patient was able to explore the value of being honest.  Patient discussed thoughts, feelings, and behaviors related to honesty and trusting in others. The patient processed together with other group members how trust and honesty relate to forming relationships with peers, family members, and self. Pt actively and appropriately engaged in the group. Patient was able to provide support and validation to other group members. Patient practiced active listening when  interacting with the facilitator and other group members.    Therapeutic Modalities:   Cognitive Behavioral Therapy Solution Focused Therapy Motivational Interviewing Brief Therapy  Julie Morrow  CUEBAS-COLON, LCSW 12/30/2018 12:38 PM

## 2018-12-31 NOTE — Progress Notes (Signed)
[facility]  12/31/2018 10:37 AM Julie DamesSusan B Morrow  MRN:  [id number]  Ms Julie HackerChurch is a 68yo F with psych h/o bipolar disorder, [facility]. Patient seen.  Chart reviewed. Patient discussed with nursing; she is med-compliant, no overnight events reported.  Subjective: Patient reports "doing well". She denies any current complaints, concerns, questions. She still has no insight in her MH condition and keeps repeating that "I am innocent. My sister put me here. It`Morrow nothing wrong with me. I never had any mental issues.  I take all three my medications here, for thyroid, blood pressure and blood sugar. My day is wonderful".  Principal Problem: Bipolar I disorder with mania (HCC) Diagnosis: Principal Problem:   Bipolar I disorder with mania (HCC) Active Problems:   HLD (hyperlipidemia)   Diabetes type 2, controlled (HCC)   Morbid (severe) obesity due to excess calories (HCC)   Hypothyroidism  Total Time spent with patient: 15 minutes  Past Psychiatric History: see H&P  Past Medical History:  Past Medical History:  Diagnosis Date  . Anxiety   . Bipolar 1 disorder (HCC)   . Bradycardia   . Depression   . Diabetes mellitus, type II (HCC)    Patient takes Glucotrol and Januvia  . Hypothyroidism   . Lithium toxicity     Past Surgical History:  Procedure Laterality Date  . ABDOMINAL HYSTERECTOMY    . CHOLECYSTECTOMY     Family History:  Family History  Problem Relation Age of Onset  . Dementia Mother   . Arthritis Father    Family Psychiatric  History: see H&P Social History:  Social History   Substance and Sexual Activity  Alcohol Use No  . Alcohol/week: 0.0 standard drinks     Social History   Substance and Sexual Activity  Drug Use No    Social History   Socioeconomic History  . Marital status: Single    Spouse name: Not on file  . Number of children: Not on file  . Years of education: Not on file  . Highest  education level: Not on file  Occupational History  . Not on file  Social Needs  . Financial resource strain: Not on file  . Food insecurity    Worry: Not on file    Inability: Not on file  . Transportation needs    Medical: Not on file    Non-medical: Not on file  Tobacco Use  . Smoking status: Former Smoker    Packs/day: 0.50    Years: 20.00    Pack years: 10.00    Types: Cigarettes    Start date: 11/06/1984  . Smokeless tobacco: Never Used  Substance and Sexual Activity  . Alcohol use: No    Alcohol/week: 0.0 standard drinks  . Drug use: No  . Sexual activity: Never  Lifestyle  . Physical activity    Days per week: Not on file    Minutes per session: Not on file  . Stress: Not on file  Relationships  . Social Musicianconnections    Talks on phone: Not on file    Gets together: Not on file    Attends religious service: Not on file    Active member of club or organization: Not on file    Attends meetings of clubs or organizations: Not on file    Relationship status: Not on file  Other Topics Concern  . Not on file  Social History  Narrative  . Not on file   Additional Social History:                         Sleep: Good  Appetite:  Good  Current Medications: Current Facility-Administered Medications  Medication Dose Route Frequency Provider Last Rate Last Dose  . acetaminophen (TYLENOL) tablet 650 mg  650 mg Oral Q6H PRN Julie Morrow, Takia S, FNP   650 mg at 12/28/18 0736  . alum & mag hydroxide-simeth (MAALOX/MYLANTA) 200-200-20 MG/5ML suspension 30 mL  30 mL Oral Q4H PRN Julie Morrow, Juel Burrowakia S, FNP      . aspirin chewable tablet 81 mg  81 mg Oral Daily Julie Morrow, Takia S, FNP   81 mg at 12/31/18 0908  . benztropine (COGENTIN) tablet 0.5 mg  0.5 mg Oral QHS Clapacs, John T, MD   0.5 mg at 12/30/18 2204  . fenofibrate tablet 160 mg  160 mg Oral Q breakfast Julie Morrow, Takia S, FNP   160 mg at 12/31/18 0908  . glipiZIDE (GLUCOTROL) tablet 10 mg  10 mg  Oral BID AC Julie Morrow, Takia S, FNP   10 mg at 12/31/18 0908  . hydrOXYzine (ATARAX/VISTARIL) tablet 25 mg  25 mg Oral TID PRN Julie Morrow, Takia S, FNP   25 mg at 12/27/18 2203  . levothyroxine (SYNTHROID) tablet 200 mcg  200 mcg Oral QAC breakfast Julie Morrow, Takia S, FNP   200 mcg at 12/31/18 0631  . magnesium hydroxide (MILK OF MAGNESIA) suspension 30 mL  30 mL Oral Daily PRN Julie Morrow, Juel Burrowakia S, FNP      . polyvinyl alcohol (LIQUIFILM TEARS) 1.4 % ophthalmic solution 1 drop  1 drop Both Eyes BID PRN Clapacs, Julie DenmarkJohn T, MD   1 drop at 12/31/18 0909  . risperiDONE (RISPERDAL M-TABS) disintegrating tablet 3 mg  3 mg Oral QHS Clapacs, John T, MD   3 mg at 12/30/18 2204    Lab Results: No results found for this or any previous visit (from the past 48 hour(Morrow)).  Blood Alcohol level:  Lab Results  Component Value Date   ETH <10 12/27/2018   ETH <10 10/24/2018    Metabolic Disorder Labs: Lab Results  Component Value Date   HGBA1C 6.1 (H) 12/28/2018   MPG 128.37 12/28/2018   MPG 119.76 05/30/2017   Lab Results  Component Value Date   PROLACTIN 15.1 07/03/2015   Lab Results  Component Value Date   CHOL 99 12/28/2018   TRIG 164 (H) 12/28/2018   HDL 28 (L) 12/28/2018   CHOLHDL 3.5 12/28/2018   VLDL 33 12/28/2018   LDLCALC 38 12/28/2018   LDLCALC 93 07/03/2015    Physical Findings: AIMS: Facial and Oral Movements Muscles of Facial Expression: None, normal Lips and Perioral Area: None, normal Jaw: None, normal Tongue: None, normal,Extremity Movements Upper (arms, wrists, hands, fingers): None, normal Lower (legs, knees, ankles, toes): None, normal, Trunk Movements Neck, shoulders, hips: None, normal, Overall Severity Severity of abnormal movements (highest score from questions above): None, normal Incapacitation due to abnormal movements: None, normal Patient'Morrow awareness of abnormal movements (rate only patient'Morrow report): No Awareness, Dental Status Current problems  with teeth and/or dentures?: Yes Does patient usually wear dentures?: Yes  CIWA:  CIWA-Ar Total: 3 COWS:  COWS Total Score: 3  Musculoskeletal: Strength & Muscle Tone: within normal limits Gait & Station: normal Patient leans: N/A  Psychiatric Specialty Exam: Physical Exam   ROS   Blood pressure 103/63, pulse 87, temperature 98.1 F (36.7 C),  temperature source Oral, resp. rate 18, height 5\' 4"  (1.626 m), weight 116.1 kg, SpO2 96 %.Body mass index is 43.94 kg/m.  General Appearance: Bizarre  Eye Contact:  Good  Speech:  Pressured  Volume:  Normal  Mood:  Euphoric  Affect:  Congruent  Thought Process:  Coherent  Orientation:  Full (Time, Place, and Person)  Thought Content:  Illogical  Suicidal Thoughts:  No  Homicidal Thoughts:  No  Memory:  Immediate;   Fair Recent;   Fair Remote;   Fair  Judgement:  poor  Insight:  Lacking  Psychomotor Activity:  Increased  Concentration:  Concentration: Fair and Attention Span: Fair  Recall:  [employer] of Knowledge:  Fair  Language:  Good  Akathisia:  No  Handed:  Right  AIMS (if indicated):     Assets:  Housing Social Support  ADL'Morrow:  Intact  Cognition:  Impaired,  Mild  Sleep:  Number of Hours: 6.25     Treatment Plan Summary: Daily contact with patient to assess and evaluate symptoms and progress in treatment   Patient is still in manic state. Slept longer (6.25h vs 3h the night before); she is hyperactive, euphoric, with pressured speech, she has no insight and poor judgement. The dose of antipsychotic was last ncreased two days ago , so I will not increase it today yet; the plan is to switch her to LAI eventually.  Impression: Bipolar disorder, current episode - mania.  Plan:  -continue inpatient psych admission; 15-minute checks; daily contact with patient to assess and evaluate symptoms and progress in treatment; psychoeducation.  -continue scheduled psych medications: Risperidone 3mg  PO QHS, Cogentin 0.5mg  PO  QHS.  -continue PRN medications.  -Disposition: patient will be d/c home with outpatient psych f/u when stable. She is not ready for d/c yet.  Larita Fife, MD 12/31/2018, 10:37 AM

## 2018-12-31 NOTE — Plan of Care (Signed)
Alert and oriented. Pleasant and cooperative. Visible in the milieu and denying thoughts of self harm.  Compliant with treatment.

## 2018-12-31 NOTE — Progress Notes (Signed)
Patient has expressed doing good with her medicines and feeling better with her therapeutic regimen, patient is seeing moving around with out her walker, and her  gait is steady, patient is educated on safety cautions since she is walking along with out her walker, patient denies any SI/HI/AVH and no delusions or hallucinations patient denies  Depressions and has not required any PRNs only monitored every 15 minutes for safety no distress.

## 2018-12-31 NOTE — [facility] Group Notes (Signed)
LCSW Group Therapy Note 12/31/2018 1:15pm  Type of Therapy and Topic: Group Therapy: Feelings Around Returning Home & Establishing a Supportive Framework and Supporting Oneself When Supports Not Available  Participation Level: Active  Description of Group:  Patients first processed thoughts and feelings about upcoming discharge. These included fears of upcoming changes, lack of change, new living environments, judgements and expectations from others and overall stigma of mental health issues. The group then discussed the definition of a supportive framework, what that looks and feels like, and how do to discern it from an unhealthy non-supportive network. The group identified different types of supports as well as what to do when your family/friends are less than helpful or unavailable  Therapeutic Goals  1. Patient will identify one healthy supportive network that they can use at discharge. 2. Patient will identify one factor of a supportive framework and how to tell it from an unhealthy network. 3. Patient able to identify one coping skill to use when they do not have positive supports from others. 4. Patient will demonstrate ability to communicate their needs through discussion and/or role plays.  Summary of Patient Progress:  The patient reported she feels "great." Pt engaged during group session. As patients processed their anxiety about discharge and described healthy supports patient shared she is ready to be discharge but she is not in a hurry.  Patients identified at least one self-care tool they were willing to use after discharge.   Therapeutic Modalities Cognitive Behavioral Therapy Motivational Interviewing   Cheree Ditto, LCSW 12/31/2018 12:32 PM

## 2018-12-31 NOTE — Plan of Care (Signed)
  Problem: Education: Goal: Knowledge of [facility] Education information/materials will improve Outcome: Progressing Goal: Emotional status will improve Outcome: Progressing Goal: Mental status will improve Outcome: Progressing Goal: Verbalization of understanding the information provided will improve Outcome: Progressing   Problem: Activity: Goal: Interest or engagement in activities will improve Outcome: Progressing Goal: Sleeping patterns will improve Outcome: Progressing   Problem: Coping: Goal: Ability to verbalize frustrations and anger appropriately will improve Outcome: Progressing Goal: Ability to demonstrate self-control will improve Outcome: Progressing   Problem: Health Behavior/Discharge Planning: Goal: Identification of resources available to assist in meeting health care needs will improve Outcome: Progressing Goal: Compliance with treatment plan for underlying cause of condition will improve Outcome: Progressing   Problem: Physical Regulation: Goal: Ability to maintain clinical measurements within normal limits will improve Outcome: Progressing   Problem: Safety: Goal: Periods of time without injury will increase Outcome: Progressing   Problem: Activity: Goal: Will verbalize the importance of balancing activity with adequate rest periods Outcome: Progressing   Problem: Education: Goal: Will be free of psychotic symptoms Outcome: Progressing Goal: Knowledge of the prescribed therapeutic regimen will improve Outcome: Progressing   Problem: Coping: Goal: Coping ability will improve Outcome: Progressing Goal: Will verbalize feelings Outcome: Progressing   Problem: Health Behavior/Discharge Planning: Goal: Compliance with prescribed medication regimen will improve Outcome: Progressing   Problem: Nutritional: Goal: Ability to achieve adequate nutritional intake will improve Outcome: Progressing   Problem: Role Relationship: Goal:  Ability to communicate needs accurately will improve Outcome: Progressing Goal: Ability to interact with others will improve Outcome: Progressing   Problem: Safety: Goal: Ability to redirect hostility and anger into socially appropriate behaviors will improve Outcome: Progressing Goal: Ability to remain free from injury will improve Outcome: Progressing   Problem: Self-Care: Goal: Ability to participate in self-care as condition permits will improve Outcome: Progressing   Problem: Self-Concept: Goal: Will verbalize positive feelings about self Outcome: Progressing   

## 2018-12-31 NOTE — Progress Notes (Signed)
Julie Morrow was visible in the milieu, walking independently, pleasant and cooperative. Reported that she did not need to use the wheelchair anymore. Gait is steady. Patient reported that she is feeling improvement in mood. Ate her meals in the dayroom and was compliant with her medication regime. Patient attended group in which she expressed her concerns in regard to postdischarge living environment. Patient made frequent phone calls and would become a little anxious during and after the phone talk. She was otherwise pleasant and able to express her needs appropriately. Was encouraged to communicate her feelings and concerns to staff as needed.  Was encouraged to discuss her postdischarge concerns with case managements. Safety precautions reinforced.

## 2019-01-01 LAB — GLUCOSE, CAPILLARY: Glucose-Capillary: 114 mg/dL — ABNORMAL HIGH (ref 70–99)

## 2019-01-01 MED ORDER — RISPERIDONE 1 MG PO TBDP
4.0000 mg | ORAL_TABLET | Freq: Every day | ORAL | Status: DC
  Administered 2019-01-01: 4 mg via ORAL
  Filled 2019-01-01: qty 4

## 2019-01-01 MED ORDER — METFORMIN HCL 500 MG PO TABS
500.0000 mg | ORAL_TABLET | Freq: Every day | ORAL | Status: DC
  Administered 2019-01-02 – 2019-01-05 (×4): 500 mg via ORAL
  Filled 2019-01-01 (×4): qty 1

## 2019-01-01 NOTE — Plan of Care (Signed)
Patient stated that her goal for today is be a positive person.Patient continues to be labile and have some pressure speech.No depression or anxiety verbalized.Denies SI,HI and AVH.Appropriate with staff & peers.Compliant with medications.Attended groups.Appetite and energy level good.Support and encouragement given.

## 2019-01-01 NOTE — Progress Notes (Signed)
[facility] MD Progress Note  01/01/2019 4:36 PM Julie Morrow  MRN:  [id number] Subjective: "I am so happy, I love it here!  Patient remains euphoric.  During conversation she gets tangential with some flight of ideas gets tearful easily and then back to giggling.  She has not been aggressive or violent and she seems to be tolerating medicine okay.  Blood sugars are still running a little bit high and so is her blood pressure at times Principal Problem: Bipolar I disorder with mania ([place]) Diagnosis: Principal Problem:   Bipolar I disorder with mania ([place]) Active Problems:   HLD (hyperlipidemia)   Diabetes type 2, controlled ([place])   Morbid (severe) obesity due to excess calories ([place])   Hypothyroidism  Total Time spent with patient: 30 minutes  Past Psychiatric History: History of bipolar disorder  Past Medical History:  Past Medical History:  Diagnosis Date  . Anxiety   . Bipolar 1 disorder ([facility])   . Bradycardia   . Depression   . Diabetes mellitus, type II ([place])    Patient takes Glucotrol and Januvia  . Hypothyroidism   . Lithium toxicity     Past Surgical History:  Procedure Laterality Date  . ABDOMINAL HYSTERECTOMY    . CHOLECYSTECTOMY     Family History:  Family History  Problem Relation Age of Onset  . Dementia Mother   . Arthritis Father    Family Psychiatric  History: See previous Social History:  Social History   Substance and Sexual Activity  Alcohol Use No  . Alcohol/week: 0.0 standard drinks     Social History   Substance and Sexual Activity  Drug Use No    Social History   Socioeconomic History  . Marital status: Single    Spouse name: Not on file  . Number of children: Not on file  . Years of education: Not on file  . Highest education level: Not on file  Occupational History  . Not on file  Social Needs  . Financial resource strain: Not on file  . Food insecurity    Worry: Not on file    Inability: Not on file  . Transportation needs     Medical: Not on file    Non-medical: Not on file  Tobacco Use  . Smoking status: Former Smoker    Packs/day: 0.50    Years: 20.00    Pack years: 10.00    Types: Cigarettes    Start date: 11/06/1984  . Smokeless tobacco: Never Used  Substance and Sexual Activity  . Alcohol use: No    Alcohol/week: 0.0 standard drinks  . Drug use: No  . Sexual activity: Never  Lifestyle  . Physical activity    Days per week: Not on file    Minutes per session: Not on file  . Stress: Not on file  Relationships  . Social Herbalist on phone: Not on file    Gets together: Not on file    Attends religious service: Not on file    Active member of club or organization: Not on file    Attends meetings of clubs or organizations: Not on file    Relationship status: Not on file  Other Topics Concern  . Not on file  Social History Narrative  . Not on file   Additional Social History:                         Sleep: Fair  Appetite:  Fair  Current Medications: Current Facility-Administered Medications  Medication Dose Route Frequency Provider Last Rate Last Dose  . acetaminophen (TYLENOL) tablet 650 mg  650 mg Oral Q6H PRN Maryagnes AmosStarkes-Perry, Takia S, FNP   650 mg at 01/01/19 0646  . alum & mag hydroxide-simeth (MAALOX/MYLANTA) 200-200-20 MG/5ML suspension 30 mL  30 mL Oral Q4H PRN Rosario AdieStarkes-Perry, Juel Burrowakia S, FNP      . aspirin chewable tablet 81 mg  81 mg Oral Daily Maryagnes AmosStarkes-Perry, Takia S, FNP   81 mg at 01/01/19 0753  . benztropine (COGENTIN) tablet 0.5 mg  0.5 mg Oral QHS Arayna Illescas T, MD   0.5 mg at 12/31/18 2200  . fenofibrate tablet 160 mg  160 mg Oral Q breakfast Maryagnes AmosStarkes-Perry, Takia S, FNP   160 mg at 01/01/19 0754  . glipiZIDE (GLUCOTROL) tablet 10 mg  10 mg Oral BID AC Maryagnes AmosStarkes-Perry, Takia S, FNP   10 mg at 01/01/19 0753  . hydrOXYzine (ATARAX/VISTARIL) tablet 25 mg  25 mg Oral TID PRN Maryagnes AmosStarkes-Perry, Takia S, FNP   25 mg at 12/27/18 2203  . levothyroxine (SYNTHROID) tablet 200  mcg  200 mcg Oral QAC breakfast Maryagnes AmosStarkes-Perry, Takia S, FNP   200 mcg at 01/01/19 0641  . magnesium hydroxide (MILK OF MAGNESIA) suspension 30 mL  30 mL Oral Daily PRN Maryagnes AmosStarkes-Perry, Takia S, FNP      . [START ON 01/02/2019] metFORMIN (GLUCOPHAGE) tablet 500 mg  500 mg Oral Q breakfast Jahira Swiss T, MD      . polyvinyl alcohol (LIQUIFILM TEARS) 1.4 % ophthalmic solution 1 drop  1 drop Both Eyes BID PRN Angele Wiemann, Jackquline DenmarkJohn T, MD   1 drop at 12/31/18 0909  . risperiDONE (RISPERDAL M-TABS) disintegrating tablet 4 mg  4 mg Oral QHS Jamaul Heist T, MD        Lab Results: No results found for this or any previous visit (from the past 48 hour(s)).  Blood Alcohol level:  Lab Results  Component Value Date   ETH <10 12/27/2018   ETH <10 10/24/2018    Metabolic Disorder Labs: Lab Results  Component Value Date   HGBA1C 6.1 (H) 12/28/2018   MPG 128.37 12/28/2018   MPG 119.76 05/30/2017   Lab Results  Component Value Date   PROLACTIN 15.1 07/03/2015   Lab Results  Component Value Date   CHOL 99 12/28/2018   TRIG 164 (H) 12/28/2018   HDL 28 (L) 12/28/2018   CHOLHDL 3.5 12/28/2018   VLDL 33 12/28/2018   LDLCALC 38 12/28/2018   LDLCALC 93 07/03/2015    Physical Findings: AIMS: Facial and Oral Movements Muscles of Facial Expression: None, normal Lips and Perioral Area: None, normal Jaw: None, normal Tongue: None, normal,Extremity Movements Upper (arms, wrists, hands, fingers): None, normal Lower (legs, knees, ankles, toes): None, normal, Trunk Movements Neck, shoulders, hips: None, normal, Overall Severity Severity of abnormal movements (highest score from questions above): None, normal Incapacitation due to abnormal movements: None, normal Patient's awareness of abnormal movements (rate only patient's report): No Awareness, Dental Status Current problems with teeth and/or dentures?: Yes Does patient usually wear dentures?: Yes  CIWA:  CIWA-Ar Total: 3 COWS:  COWS Total Score:  3  Musculoskeletal: Strength & Muscle Tone: within normal limits Gait & Station: normal Patient leans: N/A  Psychiatric Specialty Exam: Physical Exam  Nursing note and vitals reviewed. Constitutional: She appears well-developed and well-nourished.  HENT:  Head: Normocephalic and atraumatic.  Eyes: Pupils are equal, round, and reactive to light. Conjunctivae are normal.  Neck: Normal range of motion.  Cardiovascular: Regular rhythm and normal heart sounds.  Respiratory: Effort normal. No respiratory distress.  GI: Soft.  Musculoskeletal: Normal range of motion.  Neurological: She is alert.  Skin: Skin is warm and dry.  Psychiatric: Her affect is labile. Her speech is rapid and/or pressured. She is agitated. She is not aggressive. Cognition and memory are impaired. She expresses impulsivity. She expresses no homicidal and no suicidal ideation.    Review of Systems  Constitutional: Negative.   HENT: Negative.   Eyes: Negative.   Respiratory: Negative.   Cardiovascular: Negative.   Gastrointestinal: Negative.   Musculoskeletal: Negative.   Skin: Negative.   Neurological: Negative.   Psychiatric/Behavioral: Negative.     Blood pressure (!) 137/98, pulse 65, temperature 97.8 F (36.6 C), temperature source Oral, resp. rate 18, height 5\' 4"  (1.626 m), weight 116.1 kg, SpO2 97 %.Body mass index is 43.94 kg/m.  General Appearance: Casual  Eye Contact:  Fair  Speech:  Clear and Coherent  Volume:  Increased  Mood:  Euphoric  Affect:  Inappropriate and Labile  Thought Process:  Disorganized  Orientation:  Full (Time, Place, and Person)  Thought Content:  Illogical, Rumination and Tangential  Suicidal Thoughts:  No  Homicidal Thoughts:  No  Memory:  Immediate;   Fair Recent;   Poor Remote;   Fair  Judgement:  Impaired  Insight:  Shallow  Psychomotor Activity:  Increased  Concentration:  Concentration: Poor  Recall:  FiservFair  Fund of Knowledge:  Fair  Language:  Fair   Akathisia:  No  Handed:  Right  AIMS (if indicated):     Assets:  Desire for Improvement  ADL's:  Impaired  Cognition:  Impaired,  Mild  Sleep:  Number of Hours: 4     Treatment Plan Summary: Daily contact with patient to assess and evaluate symptoms and progress in treatment, Medication management and Plan Still manic.  Increasing Risperdal to 4 mg at night.  Adding low-dose of metformin for blood sugars.  Supportive counseling with the patient.  Reviewed with treatment plan.  Hopefully we will be getting her well enough to be discharged before too long.  Mordecai RasmussenJohn Yanis Larin, MD 01/01/2019, 4:36 PM

## 2019-01-01 NOTE — Plan of Care (Signed)
  Problem: Coping: Goal: Ability to verbalize frustrations and anger appropriately will improve Outcome: Progressing  Patient verbalized frustrations and anger concerning her discharge back to her home.

## 2019-01-01 NOTE — Progress Notes (Signed)
Patient alert and oriented x 4, affect is blunted, she is pleasant and appropriate with staff no distress noted, noted ambulating the unit without use of the wheelchair, he gait is steady, medicated for pain earlier in the shift and she later expressed relief. Patient is interacting appropriately with peers and staff, complaint with medication regimen, appears less anxious. Support and encouragement offered to patient, 15 minutes safety checks maintained will continue to monitor.

## 2019-01-01 NOTE — [facility] Group Notes (Signed)
LCSW Group Therapy Note   01/01/2019 11:08 AM   Type of Therapy and Topic:  Group Therapy:  Overcoming Obstacles   Participation Level:  Active   Description of Group:    In this group patients will be encouraged to explore what they see as obstacles to their own wellness and recovery. They will be guided to discuss their thoughts, feelings, and behaviors related to these obstacles. The group will process together ways to cope with barriers, with attention given to specific choices patients can make. Each patient will be challenged to identify changes they are motivated to make in order to overcome their obstacles. This group will be process-oriented, with patients participating in exploration of their own experiences as well as giving and receiving support and challenge from other group members.   Therapeutic Goals: 1. Patient will identify personal and current obstacles as they relate to admission. 2. Patient will identify barriers that currently interfere with their wellness or overcoming obstacles.  3. Patient will identify feelings, thought process and behaviors related to these barriers. 4. Patient will identify two changes they are willing to make to overcome these obstacles:      Summary of Patient Progress Pt was appropriate and respectful in group. Pt was able to identify barriers to overcoming obstacles. Pt reported that to overcome anger you could "walk, exercise, look around and see all the things in the world". Pt reported that she has been feeling "fine" since being at the hospital and reported that to overcome obstacles you need to have good support systems.     Therapeutic Modalities:   Cognitive Behavioral Therapy Solution Focused Therapy Motivational Interviewing Relapse Prevention Therapy  Evalina Field, MSW, LCSW Clinical Social Work 01/01/2019 11:08 AM

## 2019-01-01 NOTE — Progress Notes (Signed)
Recreation Therapy Notes   Date: 01/01/2019  Time: 9:30 am  Location: Craft room  Behavioral response: Appropriate   Intervention Topic: Time Management  Discussion/Intervention:  Group content today was focused on time management. The group defined time management and identified healthy ways to manage time. Individuals expressed how much of the 24 hours they use in a day. Patients expressed how much time they use just for themselves personally. The group expressed how they have managed their time in the past. Individuals participated in the intervention "Managing Life" where they had a chance to see how much of the 24 hours they use and where it goes. Clinical Observations/Feedback:  Patient came to group and defined time management as spending time wisely. She stated that she manages her time by setting time aside. Participant expressed that she could improve her time management skills by prioritizing things. Individual was social with peers and staff while participating in group.  Stephie Xu LRT/CTRS          Amee Boothe 01/01/2019 10:46 AM

## 2019-01-02 LAB — GLUCOSE, CAPILLARY: Glucose-Capillary: 131 mg/dL — ABNORMAL HIGH (ref 70–99)

## 2019-01-02 MED ORDER — RISPERIDONE 1 MG PO TBDP
6.0000 mg | ORAL_TABLET | Freq: Every day | ORAL | Status: DC
  Administered 2019-01-02 – 2019-01-04 (×3): 6 mg via ORAL
  Filled 2019-01-02 (×2): qty 6

## 2019-01-02 MED ORDER — LORATADINE 10 MG PO TABS
10.0000 mg | ORAL_TABLET | Freq: Every day | ORAL | Status: DC
  Administered 2019-01-02 – 2019-01-05 (×4): 10 mg via ORAL
  Filled 2019-01-02 (×4): qty 1

## 2019-01-02 NOTE — Progress Notes (Signed)
Recreation Therapy Notes  Date: 01/02/2019  Time: 9:30 am  Location: Craft room  Behavioral response: Appropriate   Intervention Topic: Coping-Skills  Discussion/Intervention:  Group content on today was focused on coping skills. The group defined what coping skills are and when they can be used. Individuals described how they normally cope with thing and the coping skills they normally use. Patients expressed why it is important to cope with things and how not coping with things can affect you. The group participated in the intervention "Exploring coping skills" where they had a chance to test new coping skills they could use in the future.  Clinical Observations/Feedback:  Patient came to group and defined coping-skills as trying to get along with everyone. She explained that her coping skills are talking, thinking positive thoughts and listening to music. Participant expressed that she uses coping skills when she is dealing with a difficult person like her sister. Individual was social with peers and staff while participating in group.  Julie Morrow LRT/CTRS          Julie Morrow 01/02/2019 11:51 AM

## 2019-01-02 NOTE — Plan of Care (Signed)
Patient  aware of [place] education and unit programing , verbalize understanding .  Emotional and mental status improved .  Attending unit programing  voice no concerns around sleep or wake cycle.   Continue to work on coping  decision making and anxiety  concerns . Patient denies suicidal ideations. Inform of follow  up  appointment at discharge  Patient voice of no safety concerns . Compliant  with medication .  No anger management  concerns   Problem: Education: Goal: Knowledge of [facility] Education information/materials will improve 01/02/2019 1238 by Crist InfanteFarrish, Adrijana Haros A, RN Outcome: Progressing 01/02/2019 1237 by Crist InfanteFarrish, Magdiel Bartles A, RN Outcome: Progressing 01/02/2019 1237 by Crist InfanteFarrish, Zaquan Duffner A, RN Outcome: Progressing Goal: Emotional status will improve 01/02/2019 1238 by Crist InfanteFarrish, Rukiya Hodgkins A, RN Outcome: Progressing 01/02/2019 1237 by Crist InfanteFarrish, Rie Mcneil A, RN Outcome: Progressing 01/02/2019 1237 by Crist InfanteFarrish, Tyrease Vandeberg A, RN Outcome: Progressing Goal: Mental status will improve 01/02/2019 1238 by Crist InfanteFarrish, Graeson Nouri A, RN Outcome: Progressing 01/02/2019 1237 by Crist InfanteFarrish, Alasha Mcguinness A, RN Outcome: Progressing 01/02/2019 1237 by Crist InfanteFarrish, Aimy Sweeting A, RN Outcome: Progressing Goal: Verbalization of understanding the information provided will improve 01/02/2019 1238 by Crist InfanteFarrish, Jessalynn Mccowan A, RN Outcome: Progressing 01/02/2019 1237 by Crist InfanteFarrish, Richie Bonanno A, RN Outcome: Progressing 01/02/2019 1237 by Crist InfanteFarrish, Tyri Elmore A, RN Outcome: Progressing   Problem: Education: Goal: Emotional status will improve 01/02/2019 1238 by Crist InfanteFarrish, Farrell Pantaleo A, RN Outcome: Progressing 01/02/2019 1237 by Crist InfanteFarrish, Embree Brawley A, RN Outcome: Progressing 01/02/2019 1237 by Crist InfanteFarrish, Imad Shostak A, RN Outcome: Progressing   Problem: Education: Goal: Mental status will improve 01/02/2019 1238 by Crist InfanteFarrish, Dannielle Baskins A, RN Outcome: Progressing 01/02/2019 1237 by Crist InfanteFarrish, Tyrel Lex A, RN Outcome: Progressing 01/02/2019 1237 by Crist InfanteFarrish, Rylie Limburg A, RN Outcome: Progressing   Problem:  Coping: Goal: Ability to verbalize frustrations and anger appropriately will improve 01/02/2019 1238 by Crist InfanteFarrish, Taisa Deloria A, RN Outcome: Progressing 01/02/2019 1237 by Crist InfanteFarrish, Nirvi Boehler A, RN Outcome: Progressing 01/02/2019 1237 by Crist InfanteFarrish, Requan Hardge A, RN Outcome: Progressing Goal: Ability to demonstrate self-control will improve 01/02/2019 1238 by Crist InfanteFarrish, Akiera Allbaugh A, RN Outcome: Progressing 01/02/2019 1237 by Crist InfanteFarrish, Monserat Prestigiacomo A, RN Outcome: Progressing 01/02/2019 1237 by Crist InfanteFarrish, Landis Cassaro A, RN Outcome: Progressing   Problem: Health Behavior/Discharge Planning: Goal: Identification of resources available to assist in meeting health care needs will improve 01/02/2019 1238 by Crist InfanteFarrish, Yuvraj Pfeifer A, RN Outcome: Progressing 01/02/2019 1237 by Crist InfanteFarrish, Lillyonna Armstead A, RN Outcome: Progressing 01/02/2019 1237 by Crist InfanteFarrish, Leialoha Hanna A, RN Outcome: Progressing Goal: Compliance with treatment plan for underlying cause of condition will improve 01/02/2019 1238 by Crist InfanteFarrish, Aby Gessel A, RN Outcome: Progressing 01/02/2019 1237 by Crist InfanteFarrish, Yoav Okane A, RN Outcome: Progressing 01/02/2019 1237 by Crist InfanteFarrish, Yuna Pizzolato A, RN Outcome: Progressing   Problem: Education: Goal: Knowledge of the prescribed therapeutic regimen will improve 01/02/2019 1238 by Crist InfanteFarrish, Chayson Charters A, RN Outcome: Progressing 01/02/2019 1237 by Crist InfanteFarrish, Briar Sword A, RN Outcome: Progressing 01/02/2019 1237 by Crist InfanteFarrish, Breeanna Galgano A, RN Outcome: Progressing   Problem: Coping: Goal: Coping ability will improve 01/02/2019 1238 by Crist InfanteFarrish, Tamarcus Condie A, RN Outcome: Progressing 01/02/2019 1237 by Crist InfanteFarrish, Tytan Sandate A, RN Outcome: Progressing 01/02/2019 1237 by Crist InfanteFarrish, Violia Knopf A, RN Outcome: Progressing Goal: Will verbalize feelings 01/02/2019 1238 by Crist InfanteFarrish, Dorisann Schwanke A, RN Outcome: Progressing 01/02/2019 1237 by Crist InfanteFarrish, Hanah Moultry A, RN Outcome: Progressing 01/02/2019 1237 by Crist InfanteFarrish, Unice Vantassel A, RN Outcome: Progressing   Problem: Health Behavior/Discharge Planning: Goal: Compliance with prescribed medication regimen will  improve 01/02/2019 1238 by Crist InfanteFarrish, Corbin Hott A, RN Outcome: Progressing 01/02/2019 1237 by Crist InfanteFarrish, Tearah Saulsbury A, RN Outcome: Progressing 01/02/2019 1237 by Chrisandra CarotaFarrish,  Colum Colt A, RN Outcome: Progressing   Problem: Self-Care: Goal: Ability to participate in self-care as condition permits will improve 01/02/2019 1238 by Leodis Liverpool, RN Outcome: Progressing 01/02/2019 1237 by Leodis Liverpool, RN Outcome: Progressing 01/02/2019 1237 by Leodis Liverpool, RN Outcome: Progressing   Problem: Self-Concept: Goal: Will verbalize positive feelings about self 01/02/2019 1238 by Leodis Liverpool, RN Outcome: Progressing 01/02/2019 1237 by Leodis Liverpool, RN Outcome: Progressing 01/02/2019 1237 by Leodis Liverpool, RN Outcome: Progressing

## 2019-01-02 NOTE — Progress Notes (Signed)
D: Patient stated slept good last night .Stated appetite  good and energy level  Is normal. Stated concentration  good . Stated on Depression scale 0, hopeless 0 and anxiety0 .( low 0-10 high) .  No auditory hallucinations  No pain concerns . Appropriate ADL'S. Interacting with peers and staff. Patient  aware of [employer] education and unit programing , verbalize understanding .  Emotional and mental status improved .  Attending unit programing  voice no concerns around sleep or wake cycle.   Continue to work on coping  decision making and anxiety  concerns . Patient denies suicidal ideations. Inform of follow  up  appointment at discharge  Patient voice of no safety concerns . Compliant  with medication .  No anger management  concerns   A: Encourage patient participation with unit programming . Instruction  Given on  Medication , verbalize understanding.  R: Voice no other concerns. Staff continue to monitor

## 2019-01-02 NOTE — Plan of Care (Signed)
Patient is pleasant and has improved  physically , walk without assist or a walker, complying with medication regimen and therapy, maintaining safety and ADLs , has not requested any PRNs , attends groups  sleep through the night , denies any SI/HI/AVH. Only requiring 15 minutes safety checks no distress noted.   Problem: Education: Goal: Knowledge of [facility] Education information/materials will improve Outcome: Progressing Goal: Emotional status will improve Outcome: Progressing Goal: Mental status will improve Outcome: Progressing Goal: Verbalization of understanding the information provided will improve Outcome: Progressing   Problem: Activity: Goal: Interest or engagement in activities will improve Outcome: Progressing Goal: Sleeping patterns will improve Outcome: Progressing   Problem: Coping: Goal: Ability to verbalize frustrations and anger appropriately will improve Outcome: Progressing Goal: Ability to demonstrate self-control will improve Outcome: Progressing   Problem: Health Behavior/Discharge Planning: Goal: Identification of resources available to assist in meeting health care needs will improve Outcome: Progressing Goal: Compliance with treatment plan for underlying cause of condition will improve Outcome: Progressing   Problem: Physical Regulation: Goal: Ability to maintain clinical measurements within normal limits will improve Outcome: Progressing   Problem: Safety: Goal: Periods of time without injury will increase Outcome: Progressing   Problem: Activity: Goal: Will verbalize the importance of balancing activity with adequate rest periods Outcome: Progressing   Problem: Education: Goal: Will be free of psychotic symptoms Outcome: Progressing Goal: Knowledge of the prescribed therapeutic regimen will improve Outcome: Progressing   Problem: Coping: Goal: Coping ability will improve Outcome: Progressing Goal: Will verbalize  feelings Outcome: Progressing   Problem: Health Behavior/Discharge Planning: Goal: Compliance with prescribed medication regimen will improve Outcome: Progressing   Problem: Nutritional: Goal: Ability to achieve adequate nutritional intake will improve Outcome: Progressing   Problem: Role Relationship: Goal: Ability to communicate needs accurately will improve Outcome: Progressing Goal: Ability to interact with others will improve Outcome: Progressing   Problem: Safety: Goal: Ability to redirect hostility and anger into socially appropriate behaviors will improve Outcome: Progressing Goal: Ability to remain free from injury will improve Outcome: Progressing   Problem: Self-Care: Goal: Ability to participate in self-care as condition permits will improve Outcome: Progressing   Problem: Self-Concept: Goal: Will verbalize positive feelings about self Outcome: Progressing

## 2019-01-02 NOTE — Progress Notes (Signed)
[facility]  01/02/2019 5:16 PM Julie Morrow  MRN:  [id number] Subjective: Follow-up patient with manic episode.  Patient is still a little hyperactive on the unit laughs and giggles a lot.  In conversation she is hostile about her sister but denies any actual intention of physical harm.  She has not been physically aggressive here on the unit.  She has been taking her Risperdal without any side effects or complaints.  Sleep has improved. Principal Problem: Bipolar I disorder with mania (HCC) Diagnosis: Principal Problem:   Bipolar I disorder with mania (HCC) Active Problems:   HLD (hyperlipidemia)   Diabetes type 2, controlled (HCC)   Morbid (severe) obesity due to excess calories (HCC)   Hypothyroidism  Total Time spent with patient: 30 minutes  Past Psychiatric History: History of bipolar disorder but also history of noncompliance  Past Medical History:  Past Medical History:  Diagnosis Date  . Anxiety   . Bipolar 1 disorder (HCC)   . Bradycardia   . Depression   . Diabetes mellitus, type II (HCC)    Patient takes Glucotrol and Januvia  . Hypothyroidism   . Lithium toxicity     Past Surgical History:  Procedure Laterality Date  . ABDOMINAL HYSTERECTOMY    . CHOLECYSTECTOMY     Family History:  Family History  Problem Relation Age of Onset  . Dementia Mother   . Arthritis Father    Family Psychiatric  History: See previous Social History:  Social History   Substance and Sexual Activity  Alcohol Use No  . Alcohol/week: 0.0 standard drinks     Social History   Substance and Sexual Activity  Drug Use No    Social History   Socioeconomic History  . Marital status: Single    Spouse name: Not on file  . Number of children: Not on file  . Years of education: Not on file  . Highest education level: Not on file  Occupational History  . Not on file  Social Needs  . Financial resource strain: Not on file  . Food insecurity    Worry: Not on file   Inability: Not on file  . Transportation needs    Medical: Not on file    Non-medical: Not on file  Tobacco Use  . Smoking status: Former Smoker    Packs/day: 0.50    Years: 20.00    Pack years: 10.00    Types: Cigarettes    Start date: 11/06/1984  . Smokeless tobacco: Never Used  Substance and Sexual Activity  . Alcohol use: No    Alcohol/week: 0.0 standard drinks  . Drug use: No  . Sexual activity: Never  Lifestyle  . Physical activity    Days per week: Not on file    Minutes per session: Not on file  . Stress: Not on file  Relationships  . Social Musicianconnections    Talks on phone: Not on file    Gets together: Not on file    Attends religious service: Not on file    Active member of club or organization: Not on file    Attends meetings of clubs or organizations: Not on file    Relationship status: Not on file  Other Topics Concern  . Not on file  Social History Narrative  . Not on file   Additional Social History:  Sleep: Fair  Appetite:  Fair  Current Medications: Current Facility-Administered Medications  Medication Dose Route Frequency Provider Last Rate Last Dose  . acetaminophen (TYLENOL) tablet 650 mg  650 mg Oral Q6H PRN Suella Broad, FNP   650 mg at 01/02/19 0958  . alum & mag hydroxide-simeth (MAALOX/MYLANTA) 200-200-20 MG/5ML suspension 30 mL  30 mL Oral Q4H PRN Burt Ek, Gayland Curry, FNP      . aspirin chewable tablet 81 mg  81 mg Oral Daily Suella Broad, FNP   81 mg at 01/02/19 0825  . benztropine (COGENTIN) tablet 0.5 mg  0.5 mg Oral QHS Santia Labate T, MD   0.5 mg at 01/01/19 2123  . fenofibrate tablet 160 mg  160 mg Oral Q breakfast Suella Broad, FNP   160 mg at 01/02/19 0825  . glipiZIDE (GLUCOTROL) tablet 10 mg  10 mg Oral BID AC Suella Broad, FNP   10 mg at 01/02/19 1707  . hydrOXYzine (ATARAX/VISTARIL) tablet 25 mg  25 mg Oral TID PRN Suella Broad, FNP   25 mg at  12/27/18 2203  . levothyroxine (SYNTHROID) tablet 200 mcg  200 mcg Oral QAC breakfast Suella Broad, FNP   200 mcg at 01/02/19 0932  . magnesium hydroxide (MILK OF MAGNESIA) suspension 30 mL  30 mL Oral Daily PRN Suella Broad, FNP      . metFORMIN (GLUCOPHAGE) tablet 500 mg  500 mg Oral Q breakfast Sheliah Fiorillo, Madie Reno, MD   500 mg at 01/02/19 0825  . polyvinyl alcohol (LIQUIFILM TEARS) 1.4 % ophthalmic solution 1 drop  1 drop Both Eyes BID PRN Yanelly Cantrelle, Madie Reno, MD   1 drop at 01/02/19 0958  . risperiDONE (RISPERDAL M-TABS) disintegrating tablet 6 mg  6 mg Oral QHS Temple Ewart, Madie Reno, MD        Lab Results:  Results for orders placed or performed during the hospital encounter of 12/27/18 (from the past 48 hour(s))  Glucose, capillary     Status: Abnormal   Collection Time: 01/01/19  9:05 PM  Result Value Ref Range   Glucose-Capillary 114 (H) 70 - 99 mg/dL   Comment 1 Notify RN   Glucose, capillary     Status: Abnormal   Collection Time: 01/02/19  7:15 AM  Result Value Ref Range   Glucose-Capillary 131 (H) 70 - 99 mg/dL   Comment 1 Notify RN     Blood Alcohol level:  Lab Results  Component Value Date   ETH <10 12/27/2018   ETH <10 35/57/3220    Metabolic Disorder Labs: Lab Results  Component Value Date   HGBA1C 6.1 (H) 12/28/2018   MPG 128.37 12/28/2018   MPG 119.76 05/30/2017   Lab Results  Component Value Date   PROLACTIN 15.1 07/03/2015   Lab Results  Component Value Date   CHOL 99 12/28/2018   TRIG 164 (H) 12/28/2018   HDL 28 (L) 12/28/2018   CHOLHDL 3.5 12/28/2018   VLDL 33 12/28/2018   LDLCALC 38 12/28/2018   LDLCALC 93 07/03/2015    Physical Findings: AIMS: Facial and Oral Movements Muscles of Facial Expression: None, normal Lips and Perioral Area: None, normal Jaw: None, normal Tongue: None, normal,Extremity Movements Upper (arms, wrists, hands, fingers): None, normal Lower (legs, knees, ankles, toes): None, normal, Trunk Movements Neck,  shoulders, hips: None, normal, Overall Severity Severity of abnormal movements (highest score from questions above): None, normal Incapacitation due to abnormal movements: None, normal Patient's awareness of abnormal movements (rate only  patient's report): No Awareness, Dental Status Current problems with teeth and/or dentures?: Yes Does patient usually wear dentures?: Yes  CIWA:  CIWA-Ar Total: 3 COWS:  COWS Total Score: 3  Musculoskeletal: Strength & Muscle Tone: within normal limits Gait & Station: normal Patient leans: N/A  Psychiatric Specialty Exam: Physical Exam  Nursing note and vitals reviewed. Constitutional: She appears well-developed and well-nourished.  HENT:  Head: Normocephalic and atraumatic.  Eyes: Pupils are equal, round, and reactive to light. Conjunctivae are normal.  Neck: Normal range of motion.  Cardiovascular: Regular rhythm and normal heart sounds.  Respiratory: Effort normal. No respiratory distress.  GI: Soft.  Musculoskeletal: Normal range of motion.  Neurological: She is alert.  Skin: Skin is warm and dry.  Psychiatric: Her affect is labile. Her speech is tangential. She is not aggressive. She expresses impulsivity. She expresses no homicidal and no suicidal ideation. She exhibits abnormal recent memory.    Review of Systems  Constitutional: Negative.   HENT: Negative.   Eyes: Negative.   Respiratory: Negative.   Cardiovascular: Negative.   Gastrointestinal: Negative.   Musculoskeletal: Negative.   Skin: Negative.   Neurological: Negative.   Psychiatric/Behavioral: Negative.     Blood pressure (!) 144/49, pulse 81, temperature (!) 97.5 F (36.4 C), temperature source Oral, resp. rate 16, height 5\' 4"  (1.626 m), weight 116.1 kg, SpO2 96 %.Body mass index is 43.94 kg/m.  General Appearance: Casual  Eye Contact:  Fair  Speech:  Pressured  Volume:  Increased  Mood:  Euphoric  Affect:  Congruent  Thought Process:  Disorganized  Orientation:   Full (Time, Place, and Person)  Thought Content:  Rumination  Suicidal Thoughts:  No  Homicidal Thoughts:  No  Memory:  Immediate;   Fair Recent;   Poor Remote;   Fair  Judgement:  Impaired  Insight:  Shallow  Psychomotor Activity:  Increased  Concentration:  Concentration: Poor  Recall:  FiservFair  Fund of Knowledge:  Fair  Language:  Fair  Akathisia:  No  Handed:  Right  AIMS (if indicated):     Assets:  Desire for Improvement  ADL's:  Intact  Cognition:  WNL  Sleep:  Number of Hours: 5.75     Treatment Plan Summary: Daily contact with patient to assess and evaluate symptoms and progress in treatment, Medication management and Plan Patient may be getting back a little closer to her baseline.  Risperdal dose increased to 6 mg at night.  Patient is requesting medicine for allergies which will be provided.  Reassess symptoms see if we can aim for discharge in the next couple days.  Mordecai RasmussenJohn Anand Tejada, MD 01/02/2019, 5:16 PM

## 2019-01-02 NOTE — [facility] Group Notes (Signed)
LCSW Group Therapy Note  01/02/2019 1:00 PM  Type of Therapy/Topic:  Group Therapy:  Feelings about Diagnosis  Participation Level:  Active   Description of Group:   This group will allow patients to explore their thoughts and feelings about diagnoses they have received. Patients will be guided to explore their level of understanding and acceptance of these diagnoses. Facilitator will encourage patients to process their thoughts and feelings about the reactions of others to their diagnosis and will guide patients in identifying ways to discuss their diagnosis with significant others in their lives. This group will be process-oriented, with patients participating in exploration of their own experiences, giving and receiving support, and processing challenge from other group members.   Therapeutic Goals: 1. Patient will demonstrate understanding of diagnosis as evidenced by identifying two or more symptoms of the disorder 2. Patient will be able to express two feelings regarding the diagnosis 3. Patient will demonstrate their ability to communicate their needs through discussion and/or role play  Summary of Patient Progress: Patient was present in group.  Patient was attentive.  Patient was tangential in speech, however, could be redirected with some prodding.  Patient often discussed something that made her emotional, though the subject was not clear, patient appeared to cry often throughout group, however CSW did not observe any tears.  Therapeutic Modalities:   Cognitive Behavioral Therapy Brief Therapy Feelings Identification   Assunta Curtis, MSW, LCSW 01/02/2019 2:12 PM

## 2019-01-02 NOTE — [facility] Group Notes (Signed)
[place] Group Notes:  (Nursing/MHT/Case Management/Adjunct)  Date:  01/02/2019  Time:  8:41 PM  Type of Therapy:  Group Therapy  Participation Level:  Active  Participation Quality:  Appropriate  Affect:  Appropriate  Cognitive:  Appropriate  Insight:  Good  Engagement in Group:  Engaged  Modes of Intervention:  Support  Summary of Progress/Problems:  Julie Morrow 01/02/2019, 8:41 PM

## 2019-01-03 NOTE — Plan of Care (Signed)
Patient is appropriate in the unit.Patient denies SI,HI and AVH.Patient states about her sister "I will let her go".Patient talks more organized and logical.Compliant with medications.Appetite and energy level good.Attended groups.Support and encouragement given.

## 2019-01-03 NOTE — Progress Notes (Signed)
Patient alert and oriented x 4, affect is blunted , thoughts are organized and coherent , no bizarre behavior noted. Patient is complaint with medication regimen tonight, interacting appropriately with peers and staff, no distress noted, she denies pain or discomfort. Patient was offered support and encouragement, 15 minutes safety checks maintained

## 2019-01-03 NOTE — Progress Notes (Signed)
[facility]  01/03/2019 5:02 PM Julie DamesSusan B Morrow  MRN:  [id number] Subjective: Follow-up patient with bipolar disorder.  She seems to have calmed down quite a bit and her behavior is much more appropriate.  No longer has the really wild swings in her mood.  Mostly stays on topic.  Appears to be tolerating medicine well. Principal Problem: Bipolar I disorder with mania (HCC) Diagnosis: Principal Problem:   Bipolar I disorder with mania (HCC) Active Problems:   HLD (hyperlipidemia)   Diabetes type 2, controlled (HCC)   Morbid (severe) obesity due to excess calories (HCC)   Hypothyroidism  Total Time spent with patient: 20 minutes  Past Psychiatric History: History of bipolar disorder with hospitalizations  Past Medical History:  Past Medical History:  Diagnosis Date  . Anxiety   . Bipolar 1 disorder (HCC)   . Bradycardia   . Depression   . Diabetes mellitus, type II (HCC)    Patient takes Glucotrol and Januvia  . Hypothyroidism   . Lithium toxicity     Past Surgical History:  Procedure Laterality Date  . ABDOMINAL HYSTERECTOMY    . CHOLECYSTECTOMY     Family History:  Family History  Problem Relation Age of Onset  . Dementia Mother   . Arthritis Father    Family Psychiatric  History: See previous Social History:  Social History   Substance and Sexual Activity  Alcohol Use No  . Alcohol/week: 0.0 standard drinks     Social History   Substance and Sexual Activity  Drug Use No    Social History   Socioeconomic History  . Marital status: Single    Spouse name: Not on file  . Number of children: Not on file  . Years of education: Not on file  . Highest education level: Not on file  Occupational History  . Not on file  Social Needs  . Financial resource strain: Not on file  . Food insecurity    Worry: Not on file    Inability: Not on file  . Transportation needs    Medical: Not on file    Non-medical: Not on file  Tobacco Use  . Smoking status:  Former Smoker    Packs/day: 0.50    Years: 20.00    Pack years: 10.00    Types: Cigarettes    Start date: 11/06/1984  . Smokeless tobacco: Never Used  Substance and Sexual Activity  . Alcohol use: No    Alcohol/week: 0.0 standard drinks  . Drug use: No  . Sexual activity: Never  Lifestyle  . Physical activity    Days per week: Not on file    Minutes per session: Not on file  . Stress: Not on file  Relationships  . Social Musicianconnections    Talks on phone: Not on file    Gets together: Not on file    Attends religious service: Not on file    Active member of club or organization: Not on file    Attends meetings of clubs or organizations: Not on file    Relationship status: Not on file  Other Topics Concern  . Not on file  Social History Narrative  . Not on file   Additional Social History:                         Sleep: Fair  Appetite:  Fair  Current Medications: Current Facility-Administered Medications  Medication Dose Route Frequency Provider Last Rate Last  Dose  . acetaminophen (TYLENOL) tablet 650 mg  650 mg Oral Q6H PRN Maryagnes AmosStarkes-Perry, Takia S, FNP   650 mg at 01/03/19 1636  . alum & mag hydroxide-simeth (MAALOX/MYLANTA) 200-200-20 MG/5ML suspension 30 mL  30 mL Oral Q4H PRN Rosario AdieStarkes-Perry, Juel Burrowakia S, FNP      . aspirin chewable tablet 81 mg  81 mg Oral Daily Maryagnes AmosStarkes-Perry, Takia S, FNP   81 mg at 01/03/19 0754  . benztropine (COGENTIN) tablet 0.5 mg  0.5 mg Oral QHS Clapacs, John T, MD   0.5 mg at 01/02/19 2100  . fenofibrate tablet 160 mg  160 mg Oral Q breakfast Maryagnes AmosStarkes-Perry, Takia S, FNP   160 mg at 01/03/19 0754  . glipiZIDE (GLUCOTROL) tablet 10 mg  10 mg Oral BID AC Maryagnes AmosStarkes-Perry, Takia S, FNP   10 mg at 01/03/19 1634  . hydrOXYzine (ATARAX/VISTARIL) tablet 25 mg  25 mg Oral TID PRN Maryagnes AmosStarkes-Perry, Takia S, FNP   25 mg at 12/27/18 2203  . levothyroxine (SYNTHROID) tablet 200 mcg  200 mcg Oral QAC breakfast Maryagnes AmosStarkes-Perry, Takia S, FNP   200 mcg at 01/03/19  0630  . loratadine (CLARITIN) tablet 10 mg  10 mg Oral Daily Clapacs, Jackquline DenmarkJohn T, MD   10 mg at 01/03/19 0754  . magnesium hydroxide (MILK OF MAGNESIA) suspension 30 mL  30 mL Oral Daily PRN Maryagnes AmosStarkes-Perry, Takia S, FNP      . metFORMIN (GLUCOPHAGE) tablet 500 mg  500 mg Oral Q breakfast Clapacs, Jackquline DenmarkJohn T, MD   500 mg at 01/03/19 0754  . polyvinyl alcohol (LIQUIFILM TEARS) 1.4 % ophthalmic solution 1 drop  1 drop Both Eyes BID PRN Clapacs, Jackquline DenmarkJohn T, MD   1 drop at 01/02/19 0958  . risperiDONE (RISPERDAL M-TABS) disintegrating tablet 6 mg  6 mg Oral QHS Clapacs, John T, MD   6 mg at 01/02/19 2100    Lab Results:  Results for orders placed or performed during the hospital encounter of 12/27/18 (from the past 48 hour(s))  Glucose, capillary     Status: Abnormal   Collection Time: 01/01/19  9:05 PM  Result Value Ref Range   Glucose-Capillary 114 (H) 70 - 99 mg/dL   Comment 1 Notify RN   Glucose, capillary     Status: Abnormal   Collection Time: 01/02/19  7:15 AM  Result Value Ref Range   Glucose-Capillary 131 (H) 70 - 99 mg/dL   Comment 1 Notify RN     Blood Alcohol level:  Lab Results  Component Value Date   ETH <10 12/27/2018   ETH <10 10/24/2018    Metabolic Disorder Labs: Lab Results  Component Value Date   HGBA1C 6.1 (H) 12/28/2018   MPG 128.37 12/28/2018   MPG 119.76 05/30/2017   Lab Results  Component Value Date   PROLACTIN 15.1 07/03/2015   Lab Results  Component Value Date   CHOL 99 12/28/2018   TRIG 164 (H) 12/28/2018   HDL 28 (L) 12/28/2018   CHOLHDL 3.5 12/28/2018   VLDL 33 12/28/2018   LDLCALC 38 12/28/2018   LDLCALC 93 07/03/2015    Physical Findings: AIMS: Facial and Oral Movements Muscles of Facial Expression: None, normal Lips and Perioral Area: None, normal Jaw: None, normal Tongue: None, normal,Extremity Movements Upper (arms, wrists, hands, fingers): None, normal Lower (legs, knees, ankles, toes): None, normal, Trunk Movements Neck, shoulders, hips:  None, normal, Overall Severity Severity of abnormal movements (highest score from questions above): None, normal Incapacitation due to abnormal movements: None, normal Patient's awareness  of abnormal movements (rate only patient's report): No Awareness, Dental Status Current problems with teeth and/or dentures?: Yes Does patient usually wear dentures?: Yes  CIWA:  CIWA-Ar Total: 3 COWS:  COWS Total Score: 3  Musculoskeletal: Strength & Muscle Tone: within normal limits Gait & Station: normal Patient leans: N/A  Psychiatric Specialty Exam: Physical Exam  Nursing note and vitals reviewed. Constitutional: She appears well-developed and well-nourished.  HENT:  Head: Normocephalic and atraumatic.  Eyes: Pupils are equal, round, and reactive to light. Conjunctivae are normal.  Neck: Normal range of motion.  Cardiovascular: Regular rhythm and normal heart sounds.  Respiratory: Effort normal. No respiratory distress.  GI: Soft.  Musculoskeletal: Normal range of motion.  Neurological: She is alert.  Skin: Skin is warm and dry.  Psychiatric: Her speech is normal and behavior is normal. Judgment and thought content normal. Her affect is labile. Cognition and memory are normal.    Review of Systems  Constitutional: Negative.   HENT: Negative.   Eyes: Negative.   Respiratory: Negative.   Cardiovascular: Negative.   Gastrointestinal: Negative.   Musculoskeletal: Negative.   Skin: Negative.   Neurological: Negative.   Psychiatric/Behavioral: Negative.     Blood pressure (!) 136/59, pulse 64, temperature 98.3 F (36.8 C), temperature source Oral, resp. rate 18, height 5\' 4"  (1.626 m), weight 116.1 kg, SpO2 98 %.Body mass index is 43.94 kg/m.  General Appearance: Casual  Eye Contact:  Good  Speech:  Normal Rate  Volume:  Normal  Mood:  Euthymic  Affect:  Congruent  Thought Process:  Goal Directed  Orientation:  Full (Time, Place, and Person)  Thought Content:  Logical  Suicidal  Thoughts:  No  Homicidal Thoughts:  No  Memory:  Immediate;   Fair Recent;   Fair Remote;   Fair  Judgement:  Fair  Insight:  Fair  Psychomotor Activity:  Normal  Concentration:  Concentration: Poor  Recall:  [employer] of Knowledge:  Fair  Language:  Fair  Akathisia:  No  Handed:  Right  AIMS (if indicated):     Assets:  Desire for Improvement Resilience Social Support  ADL's:  Impaired  Cognition:  WNL  Sleep:  Number of Hours: 1.5     Treatment Plan Summary: Daily contact with patient to assess and evaluate symptoms and progress in treatment, Medication management and Plan Patient has been tolerating medication and appears to be stabilizing.  Not acutely dangerous or threatening.  We are likely to be able to discharge her probably by Friday.  Alethia Berthold, MD 01/03/2019, 5:02 PM

## 2019-01-03 NOTE — [facility] Group Notes (Signed)
Emotional Regulation 01/03/2019 1PM  Type of Therapy/Topic:  Group Therapy:  Emotion Regulation  Participation Level:  Active   Description of Group:   The purpose of this group is to assist patients in learning to regulate negative emotions and experience positive emotions. Patients will be guided to discuss ways in which they have been vulnerable to their negative emotions. These vulnerabilities will be juxtaposed with experiences of positive emotions or situations, and patients will be challenged to use positive emotions to combat negative ones. Special emphasis will be placed on coping with negative emotions in conflict situations, and patients will process healthy conflict resolution skills.  Therapeutic Goals: 1. Patient will identify two positive emotions or experiences to reflect on in order to balance out negative emotions 2. Patient will label two or more emotions that they find the most difficult to experience 3. Patient will demonstrate positive conflict resolution skills through discussion and/or role plays  Summary of Patient Progress:  Actively and appropriately engaged in the group. Patient practiced active listening when interacting with the facilitator and other group members. Patient states enjoying jumping on her trampoline as a form of exercise and way to control her emotions.    Therapeutic Modalities:   Cognitive Behavioral Therapy Feelings Identification Dialectical Behavioral Therapy   Yvette Rack, LCSW 01/03/2019 1:41 PM

## 2019-01-03 NOTE — Tx Team (Signed)
Interdisciplinary Treatment and Diagnostic Plan Update  01/03/2019 Time of Session: 830a Julie DamesSusan B Morrow MRN: [id number]  Principal Diagnosis: Bipolar I disorder with mania (HCC)  Secondary Diagnoses: Principal Problem:   Bipolar I disorder with mania (HCC) Active Problems:   HLD (hyperlipidemia)   Diabetes type 2, controlled (HCC)   Morbid (severe) obesity due to excess calories (HCC)   Hypothyroidism   Current Medications:  Current Facility-Administered Medications  Medication Dose Route Frequency Provider Last Rate Last Dose  . acetaminophen (TYLENOL) tablet 650 mg  650 mg Oral Q6H PRN Maryagnes AmosStarkes-Perry, Takia S, FNP   650 mg at 01/02/19 1839  . alum & mag hydroxide-simeth (MAALOX/MYLANTA) 200-200-20 MG/5ML suspension 30 mL  30 mL Oral Q4H PRN Rosario AdieStarkes-Perry, Juel Burrowakia S, FNP      . aspirin chewable tablet 81 mg  81 mg Oral Daily Maryagnes AmosStarkes-Perry, Takia S, FNP   81 mg at 01/03/19 0754  . benztropine (COGENTIN) tablet 0.5 mg  0.5 mg Oral QHS Clapacs, John T, MD   0.5 mg at 01/02/19 2100  . fenofibrate tablet 160 mg  160 mg Oral Q breakfast Maryagnes AmosStarkes-Perry, Takia S, FNP   160 mg at 01/03/19 0754  . glipiZIDE (GLUCOTROL) tablet 10 mg  10 mg Oral BID AC Maryagnes AmosStarkes-Perry, Takia S, FNP   10 mg at 01/03/19 0755  . hydrOXYzine (ATARAX/VISTARIL) tablet 25 mg  25 mg Oral TID PRN Maryagnes AmosStarkes-Perry, Takia S, FNP   25 mg at 12/27/18 2203  . levothyroxine (SYNTHROID) tablet 200 mcg  200 mcg Oral QAC breakfast Maryagnes AmosStarkes-Perry, Takia S, FNP   200 mcg at 01/03/19 0630  . loratadine (CLARITIN) tablet 10 mg  10 mg Oral Daily Clapacs, Jackquline DenmarkJohn T, MD   10 mg at 01/03/19 0754  . magnesium hydroxide (MILK OF MAGNESIA) suspension 30 mL  30 mL Oral Daily PRN Maryagnes AmosStarkes-Perry, Takia S, FNP      . metFORMIN (GLUCOPHAGE) tablet 500 mg  500 mg Oral Q breakfast Clapacs, Jackquline DenmarkJohn T, MD   500 mg at 01/03/19 0754  . polyvinyl alcohol (LIQUIFILM TEARS) 1.4 % ophthalmic solution 1 drop  1 drop Both Eyes BID PRN Clapacs, Jackquline DenmarkJohn T, MD   1 drop at 01/02/19  0958  . risperiDONE (RISPERDAL M-TABS) disintegrating tablet 6 mg  6 mg Oral QHS Clapacs, John T, MD   6 mg at 01/02/19 2100   PTA Medications: Medications Prior to Admission  Medication Sig Dispense Refill Last Dose  . fenofibrate 160 MG tablet Take 1 tablet (160 mg total) by mouth daily with breakfast. (Patient not taking: Reported on 10/16/2018) 30 tablet 0     Patient Stressors: Health problems Marital or family conflict Medication change or noncompliance  Patient Strengths: Manufacturing systems engineerCommunication skills Supportive family/friends  Treatment Modalities: Medication Management, Group therapy, Case management,  1 to 1 session with clinician, Psychoeducation, Recreational therapy.   Physician Treatment Plan for Primary Diagnosis: Bipolar I disorder with mania (HCC) Long Term Goal(s): Improvement in symptoms so as ready for discharge Improvement in symptoms so as ready for discharge   Short Term Goals: Ability to verbalize feelings will improve Ability to disclose and discuss suicidal ideas Ability to demonstrate self-control will improve Compliance with prescribed medications will improve  Medication Management: Evaluate patient's response, side effects, and tolerance of medication regimen.  Therapeutic Interventions: 1 to 1 sessions, Unit Group sessions and Medication administration.  Evaluation of Outcomes: Progressing  Physician Treatment Plan for Secondary Diagnosis: Principal Problem:   Bipolar I disorder with mania (HCC) Active Problems:   HLD (  hyperlipidemia)   Diabetes type 2, controlled ([place])   Morbid (severe) obesity due to excess calories ([place])   Hypothyroidism  Long Term Goal(s): Improvement in symptoms so as ready for discharge Improvement in symptoms so as ready for discharge   Short Term Goals: Ability to verbalize feelings will improve Ability to disclose and discuss suicidal ideas Ability to demonstrate self-control will improve Compliance with prescribed  medications will improve     Medication Management: Evaluate patient's response, side effects, and tolerance of medication regimen.  Therapeutic Interventions: 1 to 1 sessions, Unit Group sessions and Medication administration.  Evaluation of Outcomes: Progressing   RN Treatment Plan for Primary Diagnosis: Bipolar I disorder with mania ([place]) Long Term Goal(s): Knowledge of disease and therapeutic regimen to maintain health will improve  Short Term Goals: Ability to demonstrate self-control, Ability to participate in decision making will improve, Ability to verbalize feelings will improve, Ability to identify and develop effective coping behaviors will improve and Compliance with prescribed medications will improve  Medication Management: RN will administer medications as ordered by provider, will assess and evaluate patient's response and provide education to patient for prescribed medication. RN will report any adverse and/or side effects to prescribing provider.  Therapeutic Interventions: 1 on 1 counseling sessions, Psychoeducation, Medication administration, Evaluate responses to treatment, Monitor vital signs and CBGs as ordered, Perform/monitor CIWA, COWS, AIMS and Fall Risk screenings as ordered, Perform wound care treatments as ordered.  Evaluation of Outcomes: Progressing   LCSW Treatment Plan for Primary Diagnosis: Bipolar I disorder with mania ([place]) Long Term Goal(s): Safe transition to appropriate next level of care at discharge, Engage patient in therapeutic group addressing interpersonal concerns.  Short Term Goals: Engage patient in aftercare planning with referrals and resources and Increase skills for wellness and recovery  Therapeutic Interventions: Assess for all discharge needs, 1 to 1 time with Social worker, Explore available resources and support systems, Assess for adequacy in community support network, Educate family and significant other(s) on suicide prevention,  Complete Psychosocial Assessment, Interpersonal group therapy.  Evaluation of Outcomes: Progressing   Progress in Treatment: Attending groups: Yes. Participating in groups: Yes. Taking medication as prescribed: Yes. Toleration medication: Yes. Family/Significant other contact made: Yes, individual(s) contacted:  [name], sister Patient understands diagnosis: No. Discussing patient identified problems/goals with staff: Yes. Medical problems stabilized or resolved: Yes. Denies suicidal/homicidal ideation: Yes. Issues/concerns per patient self-inventory: No. Other: NA  New problem(s) identified: No, Describe:  none reported  New Short Term/Long Term Goal(s):Attend outpatient treatment, take medication as prescribed, develop and implement healthy coping methods  Patient Goals:  "To love one another and respect everybody"  Discharge Plan or Barriers: D/C plan TBD, pt unable to identify name of provider. Update 01/03/2019: SPE pamphlet, Mobile Crisis information, and AA/NA information provided to patient for additional community support and resources. Pt has an appointment with [facility] 9/15 at [place].  Reason for Continuation of Hospitalization: Mania Medication stabilization  Estimated Length of Stay:5-7 day  Recreational Therapy: Patient: N/A Patient Goal: Patient will identify 3 positive coping skills strategies to use post d/c within 5 recreation therapy group  Sessions  Attendees: Patient: 01/03/2019 1:07 PM  Physician: Alethia Berthold 01/03/2019 1:07 PM  Nursing:  01/03/2019 1:07 PM  RN Care Manager: 01/03/2019 1:07 PM  Social Worker: Bhavesh Vazquez LCSW 01/03/2019 1:07 PM  Recreational Therapist:  01/03/2019 1:07 PM  Other:  01/03/2019 1:07 PM  Other:  01/03/2019 1:07 PM  Other: 01/03/2019 1:07 PM    Scribe  for Treatment Team: Mechele DawleyOlivia K Vitalia Stough, LCSW 01/03/2019 1:07 PM

## 2019-01-03 NOTE — Progress Notes (Signed)
Recreation Therapy Notes    Date: 01/03/2019  Time: 9:30 am  Location: Craft room  Behavioral response: Appropriate   Intervention Topic: Problem-Solving  Discussion/Intervention:  Group content on today was focused on problem solving. The group described what problem solving is. Patients expressed how problems affect them and how they deal with problems. Individuals identified healthy ways to deal with problems. Patients explained what normally happens to them when they do not deal with problems. The group expressed reoccurring problems for them. The group participated in the intervention "Ways to Solve problems" where patients were given a chance to explore different ways to solve problems.  Clinical Observations/Feedback:  Patient came to group and explained that she can improve her problem solving skills by not keeping things bottled up. Individual was social with peers and staff while participating in the intervention.  Marjie Chea LRT/CTRS         Dashauna Heymann 01/03/2019 11:58 AM

## 2019-01-04 MED ORDER — GLIPIZIDE 10 MG PO TABS: 10.0000 mg | ORAL_TABLET | Freq: Two times a day (BID) | ORAL | 0 refills | Status: DC

## 2019-01-04 MED ORDER — METFORMIN HCL 500 MG PO TABS: 500.0000 mg | ORAL_TABLET | Freq: Every day | ORAL | 0 refills | Status: DC

## 2019-01-04 MED ORDER — LEVOTHYROXINE SODIUM 200 MCG PO TABS: 200.0000 ug | ORAL_TABLET | Freq: Every day | ORAL | 0 refills | Status: DC

## 2019-01-04 MED ORDER — ASPIRIN 81 MG PO CHEW: 81.0000 mg | CHEWABLE_TABLET | Freq: Every day | ORAL | 0 refills | Status: DC

## 2019-01-04 MED ORDER — FENOFIBRATE 160 MG PO TABS: 160.0000 mg | ORAL_TABLET | Freq: Every day | ORAL | 0 refills | Status: DC

## 2019-01-04 MED ORDER — LORATADINE 10 MG PO TABS: 10.0000 mg | ORAL_TABLET | Freq: Every day | ORAL | 0 refills | Status: DC

## 2019-01-04 MED ORDER — BENZTROPINE MESYLATE 0.5 MG PO TABS: 0.5000 mg | ORAL_TABLET | Freq: Every day | ORAL | 0 refills | Status: DC

## 2019-01-04 MED ORDER — RISPERIDONE 3 MG PO TABS: 6.0000 mg | ORAL_TABLET | Freq: Every day | ORAL | 0 refills | Status: DC

## 2019-01-04 NOTE — BHH Group Notes (Signed)
LCSW Group Therapy Note  01/04/2019 11:15 AM  Type of Therapy/Topic:  Group Therapy:  Balance in Life  Participation Level:  Active  Description of Group:    This group will address the concept of balance and how it feels and looks when one is unbalanced. Patients will be encouraged to process areas in their lives that are out of balance and identify reasons for remaining unbalanced. Facilitators will guide patients in utilizing problem-solving interventions to address and correct the stressor making their life unbalanced. Understanding and applying boundaries will be explored and addressed for obtaining and maintaining a balanced life. Patients will be encouraged to explore ways to assertively make their unbalanced needs known to significant others in their lives, using other group members and facilitator for support and feedback.  Therapeutic Goals: 1. Patient will identify two or more emotions or situations they have that consume much of in their lives. 2. Patient will identify signs/triggers that life has become out of balance:  3. Patient will identify two ways to set boundaries in order to achieve balance in their lives:  4. Patient will demonstrate ability to communicate their needs through discussion and/or role plays  Summary of Patient Progress: Pt was appropriate and respectful in group. Pt was able to report that being balanced in life was similar to a scale where both sides have to be even so you cannot have anything outweigh anything else. Pt related most of her balance to her faith and God and reported that she felt balanced in life currently and that all the domains were very good for her.     Therapeutic Modalities:   Cognitive Behavioral Therapy Solution-Focused Therapy Assertiveness Training  Evalina Field, MSW, LCSW Clinical Social Work 01/04/2019 11:15 AM

## 2019-01-04 NOTE — Progress Notes (Addendum)
Recreation Therapy Notes  Date: 01/04/2019  Time: 9:30 am  Location: Outside  Behavioral response: Appropriate   Intervention Topic: Relaxation   Discussion/Intervention:  Group content today was focused on relaxation. The group defined relaxation and identified healthy ways to relax. Individuals expressed how much time they spend relaxing. Patients expressed how much their life would be if they did not make time for themselves to relax. The group stated ways they could improve their relaxation techniques in the future.  Individuals participated in the intervention "Time to Relax" where they had a chance to experience different relaxation techniques.  Clinical Observations/Feedback:  Patient came to group and defined relaxation as chilling out and not doing or thinking about anything. She stated that she likes to read magazines when she is relaxing. Participant explained that relaxation is important to have time to yourself. Patient expressed that she spends about two hours a day relaxing. Individual was social with peers and staff while participating in the intervention.  Lyvonne Cassell LRT/CTRS         Maheen Cwikla 01/04/2019 11:09 AM

## 2019-01-04 NOTE — BHH Suicide Risk Assessment (Signed)
[facility] Discharge Suicide Risk Assessment   Principal Problem: Bipolar I disorder with mania ([place]) Discharge Diagnoses: Principal Problem:   Bipolar I disorder with mania ([place]) Active Problems:   HLD (hyperlipidemia)   Diabetes type 2, controlled ([place])   Morbid (severe) obesity due to excess calories ([place])   Hypothyroidism   Total Time spent with patient: 45 minutes  Musculoskeletal: Strength & Muscle Tone: within normal limits Gait & Station: normal Patient leans: N/A  Psychiatric Specialty Exam: Review of Systems  Constitutional: Negative.   HENT: Negative.   Eyes: Negative.   Respiratory: Negative.   Cardiovascular: Negative.   Gastrointestinal: Negative.   Musculoskeletal: Negative.   Skin: Negative.   Neurological: Negative.   Psychiatric/Behavioral: Negative.     Blood pressure (!) 95/53, pulse 78, temperature 97.8 F (36.6 C), temperature source Oral, resp. rate 18, height 5\' 4"  (1.626 m), weight 116.1 kg, SpO2 97 %.Body mass index is 43.94 kg/m.  General Appearance: Casual  Eye Contact::  Fair  Speech:  Normal Rate409  Volume:  Normal  Mood:  Euthymic  Affect:  Congruent  Thought Process:  Goal Directed  Orientation:  Full (Time, Place, and Person)  Thought Content:  Logical  Suicidal Thoughts:  No  Homicidal Thoughts:  No  Memory:  Immediate;   Fair Recent;   Fair Remote;   Fair  Judgement:  Fair  Insight:  Fair  Psychomotor Activity:  Decreased  Concentration:  Fair  Recall:  [employer] of Knowledge:Fair  Language: Fair  Akathisia:  No  Handed:  Right  AIMS (if indicated):     Assets:  Desire for Improvement Housing Physical Health Resilience  Sleep:  Number of Hours: 6  Cognition: WNL  ADL's:  Intact   Mental Status Per Nursing Assessment::   On Admission:  NA  Demographic Factors:  Caucasian and Unemployed  Loss Factors: Decline in physical health  Historical Factors: Impulsivity  Risk Reduction Factors:   Religious beliefs  about death, Living with another person, especially a relative, Positive social support and Positive therapeutic relationship  Continued Clinical Symptoms:  Bipolar Disorder:   Mixed State  Cognitive Features That Contribute To Risk:  None    Suicide Risk:  Minimal: No identifiable suicidal ideation.  Patients presenting with no risk factors but with morbid ruminations; may be classified as minimal risk based on the severity of the depressive symptoms  [place], [facility] Follow up on 01/30/2019.   Specialty: Behavioral Health Why: You have an appointment with Dr. Casimiro Needle scheduled for Tuesday 01/30/19 at [place], it will be over the phone. You have a balance of $150 due at appointment. Please have payment ready, and call the office if you need to reschedule. Thank You! Contact information: [place] [address] [phone]           Plan Of Care/Follow-up recommendations:  Activity:  Activity as tolerated Diet:  Regular diet Other:  Follow-up with outpatient treatment  Alethia Berthold, MD 01/04/2019, 4:31 PM

## 2019-01-04 NOTE — Progress Notes (Signed)
[facility]  01/04/2019 4:25 PM Arther DamesSusan B Frost  MRN:  [id number] Subjective: Follow-up for patient with bipolar disorder.  She has calm down quite a bit.  Able to sit down and have a lucid conversation.  Does not get angry or agitated.  Denies suicidal or homicidal thought.  Interacting with peers appropriately.  Sleeping better at night.  No sign of side effects to medicine. Principal Problem: Bipolar I disorder with mania (HCC) Diagnosis: Principal Problem:   Bipolar I disorder with mania (HCC) Active Problems:   HLD (hyperlipidemia)   Diabetes type 2, controlled (HCC)   Morbid (severe) obesity due to excess calories (H[facility])   Hypothyroidism  Total Time spent with patient: 30 minutes  Past Psychiatric History: Patient has a history of bipolar disorder and has had recent hospitalizations because of agitation at home  Past Medical History:  Past Medical History:  Diagnosis Date  . Anxiety   . Bipolar 1 disorder (HCC)   . Bradycardia   . Depression   . Diabetes mellitus, type II (HCC)    Patient takes Glucotrol and Januvia  . Hypothyroidism   . Lithium toxicity     Past Surgical History:  Procedure Laterality Date  . ABDOMINAL HYSTERECTOMY    . CHOLECYSTECTOMY     Family History:  Family History  Problem Relation Age of Onset  . Dementia Mother   . Arthritis Father    Family Psychiatric  History: See previous Social History:  Social History   Substance and Sexual Activity  Alcohol Use No  . Alcohol/week: 0.0 standard drinks     Social History   Substance and Sexual Activity  Drug Use No    Social History   Socioeconomic History  . Marital status: Single    Spouse name: Not on file  . Number of children: Not on file  . Years of education: Not on file  . Highest education level: Not on file  Occupational History  . Not on file  Social Needs  . Financial resource strain: Not on file  . Food insecurity    Worry: Not on file    Inability: Not on  file  . Transportation needs    Medical: Not on file    Non-medical: Not on file  Tobacco Use  . Smoking status: Former Smoker    Packs/day: 0.50    Years: 20.00    Pack years: 10.00    Types: Cigarettes    Start date: 11/06/1984  . Smokeless tobacco: Never Used  Substance and Sexual Activity  . Alcohol use: No    Alcohol/week: 0.0 standard drinks  . Drug use: No  . Sexual activity: Never  Lifestyle  . Physical activity    Days per week: Not on file    Minutes per session: Not on file  . Stress: Not on file  Relationships  . Social Musicianconnections    Talks on phone: Not on file    Gets together: Not on file    Attends religious service: Not on file    Active member of club or organization: Not on file    Attends meetings of clubs or organizations: Not on file    Relationship status: Not on file  Other Topics Concern  . Not on file  Social History Narrative  . Not on file   Additional Social History:  Sleep: Fair  Appetite:  Fair  Current Medications: Current Facility-Administered Medications  Medication Dose Route Frequency Provider Last Rate Last Dose  . acetaminophen (TYLENOL) tablet 650 mg  650 mg Oral Q6H PRN Suella Broad, FNP   650 mg at 01/04/19 1551  . alum & mag hydroxide-simeth (MAALOX/MYLANTA) 200-200-20 MG/5ML suspension 30 mL  30 mL Oral Q4H PRN Burt Ek, Gayland Curry, FNP      . aspirin chewable tablet 81 mg  81 mg Oral Daily Suella Broad, FNP   81 mg at 01/04/19 0825  . benztropine (COGENTIN) tablet 0.5 mg  0.5 mg Oral QHS Alyona Romack T, MD   0.5 mg at 01/03/19 2129  . fenofibrate tablet 160 mg  160 mg Oral Q breakfast Suella Broad, FNP   160 mg at 01/04/19 5784  . glipiZIDE (GLUCOTROL) tablet 10 mg  10 mg Oral BID AC Suella Broad, FNP   10 mg at 01/04/19 6962  . hydrOXYzine (ATARAX/VISTARIL) tablet 25 mg  25 mg Oral TID PRN Suella Broad, FNP   25 mg at 12/27/18 2203  .  levothyroxine (SYNTHROID) tablet 200 mcg  200 mcg Oral QAC breakfast Suella Broad, FNP   200 mcg at 01/04/19 9528  . loratadine (CLARITIN) tablet 10 mg  10 mg Oral Daily Reggie Welge, Madie Reno, MD   10 mg at 01/04/19 0824  . magnesium hydroxide (MILK OF MAGNESIA) suspension 30 mL  30 mL Oral Daily PRN Suella Broad, FNP      . metFORMIN (GLUCOPHAGE) tablet 500 mg  500 mg Oral Q breakfast Chyrl Elwell, Madie Reno, MD   500 mg at 01/04/19 0824  . polyvinyl alcohol (LIQUIFILM TEARS) 1.4 % ophthalmic solution 1 drop  1 drop Both Eyes BID PRN Alliana Mcauliff, Madie Reno, MD   1 drop at 01/04/19 0830  . risperiDONE (RISPERDAL M-TABS) disintegrating tablet 6 mg  6 mg Oral QHS Anivea Velasques, Madie Reno, MD   6 mg at 01/03/19 2129    Lab Results: No results found for this or any previous visit (from the past 48 hour(s)).  Blood Alcohol level:  Lab Results  Component Value Date   ETH <10 12/27/2018   ETH <10 41/32/4401    Metabolic Disorder Labs: Lab Results  Component Value Date   HGBA1C 6.1 (H) 12/28/2018   MPG 128.37 12/28/2018   MPG 119.76 05/30/2017   Lab Results  Component Value Date   PROLACTIN 15.1 07/03/2015   Lab Results  Component Value Date   CHOL 99 12/28/2018   TRIG 164 (H) 12/28/2018   HDL 28 (L) 12/28/2018   CHOLHDL 3.5 12/28/2018   VLDL 33 12/28/2018   LDLCALC 38 12/28/2018   LDLCALC 93 07/03/2015    Physical Findings: AIMS: Facial and Oral Movements Muscles of Facial Expression: None, normal Lips and Perioral Area: None, normal Jaw: None, normal Tongue: None, normal,Extremity Movements Upper (arms, wrists, hands, fingers): None, normal Lower (legs, knees, ankles, toes): None, normal, Trunk Movements Neck, shoulders, hips: None, normal, Overall Severity Severity of abnormal movements (highest score from questions above): None, normal Incapacitation due to abnormal movements: None, normal Patient's awareness of abnormal movements (rate only patient's report): No Awareness, Dental  Status Current problems with teeth and/or dentures?: Yes Does patient usually wear dentures?: Yes  CIWA:  CIWA-Ar Total: 3 COWS:  COWS Total Score: 3  Musculoskeletal: Strength & Muscle Tone: within normal limits Gait & Station: normal Patient leans: N/A  Psychiatric Specialty Exam: Physical Exam  Nursing note  and vitals reviewed. Constitutional: She appears well-developed and well-nourished.  HENT:  Head: Normocephalic and atraumatic.  Eyes: Pupils are equal, round, and reactive to light. Conjunctivae are normal.  Neck: Normal range of motion.  Cardiovascular: Regular rhythm and normal heart sounds.  Respiratory: Effort normal. No respiratory distress.  GI: Soft.  Musculoskeletal: Normal range of motion.  Neurological: She is alert.  Skin: Skin is warm and dry.  Psychiatric: She has a normal mood and affect. Her speech is normal. Judgment and thought content normal. Cognition and memory are normal.    Review of Systems  Constitutional: Negative.   HENT: Negative.   Eyes: Negative.   Respiratory: Negative.   Cardiovascular: Negative.   Gastrointestinal: Negative.   Musculoskeletal: Negative.   Skin: Negative.   Neurological: Negative.   Psychiatric/Behavioral: Negative.     Blood pressure (!) 95/53, pulse 78, temperature 97.8 F (36.6 C), temperature source Oral, resp. rate 18, height 5\' 4"  (1.626 m), weight 116.1 kg, SpO2 97 %.Body mass index is 43.94 kg/m.  General Appearance: Casual  Eye Contact:  Good  Speech:  Normal Rate  Volume:  Normal  Mood:  Euthymic  Affect:  Congruent  Thought Process:  Goal Directed  Orientation:  Full (Time, Place, and Person)  Thought Content:  Logical  Suicidal Thoughts:  No  Homicidal Thoughts:  No  Memory:  Immediate;   Fair Recent;   Fair Remote;   Fair  Judgement:  Fair  Insight:  Fair  Psychomotor Activity:  Normal  Concentration:  Concentration: Fair  Recall:  [employer]  Fund of Knowledge:  Fair  Language:  Fair   Akathisia:  No  Handed:  Right  AIMS (if indicated):     Assets:  Desire for Improvement Housing Physical Health  ADL's:  Intact  Cognition:  WNL  Sleep:  Number of Hours: 6     Treatment Plan Summary: Daily contact with patient to assess and evaluate symptoms and progress in treatment, Medication management and Plan Patient agrees to plan for discharge tomorrow.  She will continue on the current Risperdal.  Medical problems appear to be stable.  Patient is not currently appearing to present an acute danger to self or others.  Supportive counseling and review of the importance of outpatient follow-up.  Social work aware.  We will start making arrangements for discharge  Mordecai RasmussenJohn Arah Aro, MD 01/04/2019, 4:25 PM

## 2019-01-04 NOTE — Plan of Care (Signed)
Pt denies depression, anxiety, SI, HI and AVH. Pt was educated on care plan and verbalizes understanding. Mickey Hebel RN  Problem: Education: Goal: Knowledge of [facility] Education information/materials will improve Outcome: Progressing Goal: Emotional status will improve Outcome: Progressing Goal: Mental status will improve Outcome: Progressing Goal: Verbalization of understanding the information provided will improve Outcome: Progressing   Problem: Activity: Goal: Interest or engagement in activities will improve Outcome: Progressing Goal: Sleeping patterns will improve Outcome: Progressing   Problem: Coping: Goal: Ability to verbalize frustrations and anger appropriately will improve Outcome: Progressing Goal: Ability to demonstrate self-control will improve Outcome: Progressing   Problem: Health Behavior/Discharge Planning: Goal: Identification of resources available to assist in meeting health care needs will improve Outcome: Progressing Goal: Compliance with treatment plan for underlying cause of condition will improve Outcome: Progressing   Problem: Physical Regulation: Goal: Ability to maintain clinical measurements within normal limits will improve Outcome: Progressing   Problem: Safety: Goal: Periods of time without injury will increase Outcome: Progressing   Problem: Activity: Goal: Will verbalize the importance of balancing activity with adequate rest periods Outcome: Progressing   Problem: Education: Goal: Will be free of psychotic symptoms Outcome: Progressing Goal: Knowledge of the prescribed therapeutic regimen will improve Outcome: Progressing   Problem: Coping: Goal: Coping ability will improve Outcome: Progressing Goal: Will verbalize feelings Outcome: Progressing   Problem: Health Behavior/Discharge Planning: Goal: Compliance with prescribed medication regimen will improve Outcome: Progressing   Problem: Nutritional: Goal:  Ability to achieve adequate nutritional intake will improve Outcome: Progressing   Problem: Role Relationship: Goal: Ability to communicate needs accurately will improve Outcome: Progressing Goal: Ability to interact with others will improve Outcome: Progressing   Problem: Safety: Goal: Ability to redirect hostility and anger into socially appropriate behaviors will improve Outcome: Progressing Goal: Ability to remain free from injury will improve Outcome: Progressing   Problem: Self-Care: Goal: Ability to participate in self-care as condition permits will improve Outcome: Progressing   Problem: Self-Concept: Goal: Will verbalize positive feelings about self Outcome: Progressing   

## 2019-01-05 MED ORDER — FENOFIBRATE 160 MG PO TABS: 160.0000 mg | ORAL_TABLET | Freq: Every day | ORAL | 1 refills | Status: DC

## 2019-01-05 MED ORDER — GLIPIZIDE 10 MG PO TABS: 10.0000 mg | ORAL_TABLET | Freq: Two times a day (BID) | ORAL | 1 refills | Status: DC

## 2019-01-05 MED ORDER — RISPERIDONE 3 MG PO TABS: 6.0000 mg | ORAL_TABLET | Freq: Every day | ORAL | 1 refills | Status: DC

## 2019-01-05 MED ORDER — LEVOTHYROXINE SODIUM 200 MCG PO TABS: 200.0000 ug | ORAL_TABLET | Freq: Every day | ORAL | 1 refills | Status: DC

## 2019-01-05 MED ORDER — METFORMIN HCL 500 MG PO TABS: 500.0000 mg | ORAL_TABLET | Freq: Every day | ORAL | 1 refills | Status: DC

## 2019-01-05 MED ORDER — ASPIRIN 81 MG PO CHEW: 81.0000 mg | CHEWABLE_TABLET | Freq: Every day | ORAL | 1 refills | Status: DC

## 2019-01-05 MED ORDER — BENZTROPINE MESYLATE 0.5 MG PO TABS: 0.5000 mg | ORAL_TABLET | Freq: Every day | ORAL | 1 refills | Status: DC

## 2019-01-05 MED ORDER — LORATADINE 10 MG PO TABS: 10.0000 mg | ORAL_TABLET | Freq: Every day | ORAL | 1 refills | Status: DC

## 2019-01-05 NOTE — BHH Group Notes (Signed)
[place] Group Notes:  (Nursing/MHT/Case Management/Adjunct)  Date:  01/05/2019  Time:  4:50 AM  Type of Therapy:  Group Therapy  Participation Level:  Active  Participation Quality:  Monopolizing  Affect:  Appropriate  Cognitive:  Alert  Insight:  Good  Engagement in Group:  Engaged  Modes of Intervention:  Support  Summary of Progress/Problems:  Nehemiah Settle 01/05/2019, 4:50 AM

## 2019-01-05 NOTE — Progress Notes (Signed)
Pt denies SI, HI and AVH. Pt received AVS. Suicide risk assessment, transition record,prescriptions, medications and belongings. Pt was educated on discharge plan and verbalizes understanding. Pt was discharged to home via taxi. Collier Bullock RN

## 2019-01-05 NOTE — Progress Notes (Signed)
Recreation Therapy Notes    Date: 01/05/2019  Time: 9:30 am  Location: Craft room  Behavioral response: Appropriate   Intervention Topic: Leisure   Discussion/Intervention:  Group content today was focused on leisure. The group defined what leisure is and some positive leisure activities they participate in. Individuals identified the difference between good and bad leisure. Participants expressed how they feel after participating in the leisure of their choice. The group discussed how they go about picking a leisure activity and if others are involved in their leisure activities. The patient stated how many leisure activities they too choose from and reasons why it is important to have leisure time. Individuals participated in the intervention "Exploration of Leisure" where they had a chance to identify new leisure activities as well as benefits of leisure. Clinical Observations/Feedback:  Patient came to group and identified her favorite movie as Forrest Gump. She explained that she likes to watch any tv shows on the [employer] channel. Participant expressed that she spends at least one hour a day participating in leisure. Individual was social with peers and staff while participating in the intervention.  Keyaria Lawson LRT/CTRS         Dametria Tuzzolino 01/05/2019 11:25 AM

## 2019-01-05 NOTE — Progress Notes (Signed)
Patient alert and oriented x 4, affect is flat but she brightens upon approach, she is pleasant and appropriate with staff no distress noted, she is interacting appropriately with peers and staff, complaint with medication regimen, appears less anxious. Support and encouragement offered to patient, 15 minutes safety checks maintained will continue to monitor.
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
## 2019-01-05 NOTE — Plan of Care (Signed)
  Problem: Coping Skills Goal: STG - Patient will identify 3 positive coping skills strategies to use post d/c within 5 recreation therapy group sessions Description: STG - Patient will identify 3 positive coping skills strategies to use post d/c within 5 recreation therapy group sessions Outcome: Completed/Met

## 2019-01-05 NOTE — Progress Notes (Signed)
  [facility] Adult Case Management Discharge Plan :  Will you be returning to the same living situation after discharge:  Yes,  home At discharge, do you have transportation home?: Yes,  taxi will be provided Do you have the ability to pay for your medications: Yes,  [employer] Medicare  Release of information consent forms completed and in the chart;    Patient to Follow up at: Follow-up [place], [facility] Follow up on 01/30/2019.   Specialty: Behavioral Health Why: You have an appointment with Dr. Casimiro Needle scheduled for Tuesday 01/30/19 at [place], it will be over the phone. You have a balance of $150 due at appointment. Please have payment ready, and call the office if you need to reschedule. Thank You! Contact information: [address] [place] [address] [phone]           Next level of care provider has access to [place] and Suicide Prevention discussed: Yes,  SPE completed with pts sister  Have you used any form of tobacco in the last 30 days? (Cigarettes, Smokeless Tobacco, Cigars, and/or Pipes): No  Has patient been referred to the Quitline?: N/A patient is not a smoker  Patient has been referred for addiction treatment: N/A  Delfin Edis, LCSW 01/05/2019, 9:07 AM

## 2019-01-05 NOTE — Plan of Care (Signed)
  Problem: Coping: Goal: Will verbalize feelings Outcome: Progressing  Patient verbalized feelings to staff.  

## 2019-01-05 NOTE — Progress Notes (Signed)
Recreation Therapy Notes  INPATIENT RECREATION TR PLAN  Patient Details Name: Julie Morrow MRN: [id number] DOB: [birth year] Today's Date: 01/05/2019  Rec Therapy Plan Is patient appropriate for Therapeutic Recreation?: Yes Treatment times per week: At least 3 Estimated Length of Stay: 5-7 days TR Treatment/Interventions: Group participation (Comment)  Discharge Criteria Pt will be discharged from therapy if:: Discharged Treatment plan/goals/alternatives discussed and agreed upon by:: Patient/family  Discharge Summary Short term goals set: Patient will identify 3 positive coping skills strategies to use post d/c within 5 recreation therapy group sessions Short term goals met: Complete Progress toward goals comments: Groups attended Which groups?: Leisure education, Coping skills, Other (Comment)(Relaxation, Problem solving, Time Management, Decision making, Happiness) Reason goals not met: N/A Therapeutic equipment acquired: N/A Reason patient discharged from therapy: Discharge from hospital Pt/family agrees with progress & goals achieved: Yes Date patient discharged from therapy: 01/05/19   Sanjay Broadfoot 01/05/2019, 12:32 PM

## 2019-01-05 NOTE — Plan of Care (Signed)
Pt denies anxiety, depression, SI, HI and AVH. Pt was educated on care plan and discharge plan and verbalizes understanding. Collier Bullock RN Problem: Education: Goal: Knowledge of  General Education information/materials will improve Outcome: Adequate for Discharge Goal: Emotional status will improve Outcome: Adequate for Discharge Goal: Mental status will improve Outcome: Adequate for Discharge Goal: Verbalization of understanding the information provided will improve Outcome: Adequate for Discharge   Problem: Activity: Goal: Interest or engagement in activities will improve Outcome: Adequate for Discharge Goal: Sleeping patterns will improve Outcome: Adequate for Discharge   Problem: Coping: Goal: Ability to verbalize frustrations and anger appropriately will improve Outcome: Adequate for Discharge Goal: Ability to demonstrate self-control will improve Outcome: Adequate for Discharge   Problem: Health Behavior/Discharge Planning: Goal: Identification of resources available to assist in meeting health care needs will improve Outcome: Adequate for Discharge Goal: Compliance with treatment plan for underlying cause of condition will improve Outcome: Adequate for Discharge   Problem: Physical Regulation: Goal: Ability to maintain clinical measurements within normal limits will improve Outcome: Adequate for Discharge   Problem: Safety: Goal: Periods of time without injury will increase Outcome: Adequate for Discharge   Problem: Activity: Goal: Will verbalize the importance of balancing activity with adequate rest periods Outcome: Adequate for Discharge   Problem: Education: Goal: Will be free of psychotic symptoms Outcome: Adequate for Discharge Goal: Knowledge of the prescribed therapeutic regimen will improve Outcome: Adequate for Discharge   Problem: Coping: Goal: Coping ability will improve Outcome: Adequate for Discharge Goal: Will verbalize  feelings Outcome: Adequate for Discharge   Problem: Health Behavior/Discharge Planning: Goal: Compliance with prescribed medication regimen will improve Outcome: Adequate for Discharge   Problem: Nutritional: Goal: Ability to achieve adequate nutritional intake will improve Outcome: Adequate for Discharge   Problem: Role Relationship: Goal: Ability to communicate needs accurately will improve Outcome: Adequate for Discharge Goal: Ability to interact with others will improve Outcome: Adequate for Discharge   Problem: Safety: Goal: Ability to redirect hostility and anger into socially appropriate behaviors will improve Outcome: Adequate for Discharge Goal: Ability to remain free from injury will improve Outcome: Adequate for Discharge   Problem: Self-Care: Goal: Ability to participate in self-care as condition permits will improve Outcome: Adequate for Discharge   Problem: Self-Concept: Goal: Will verbalize positive feelings about self Outcome: Adequate for Discharge

## 2019-01-08 ENCOUNTER — Other Ambulatory Visit: Payer: Self-pay | Admitting: Psychiatry

## 2019-01-14 ENCOUNTER — Emergency Department (HOSPITAL_COMMUNITY)
Admission: EM | Admit: 2019-01-14 | Discharge: 2019-01-15 | Disposition: A | Discharge status: Home / Self Care | Payer: Medicare HMO | Attending: Emergency Medicine | Admitting: Emergency Medicine

## 2019-01-14 ENCOUNTER — Other Ambulatory Visit: Payer: Self-pay

## 2019-01-14 ENCOUNTER — Encounter (HOSPITAL_COMMUNITY): Payer: Self-pay

## 2019-01-14 DIAGNOSIS — Z7984 Long term (current) use of oral hypoglycemic drugs: Secondary | ICD-10-CM | POA: Insufficient documentation

## 2019-01-14 DIAGNOSIS — N289 Disorder of kidney and ureter, unspecified: Secondary | ICD-10-CM

## 2019-01-14 DIAGNOSIS — Z79899 Other long term (current) drug therapy: Secondary | ICD-10-CM | POA: Insufficient documentation

## 2019-01-14 DIAGNOSIS — F29 Unspecified psychosis not due to a substance or known physiological condition: Secondary | ICD-10-CM

## 2019-01-14 DIAGNOSIS — Z046 Encounter for general psychiatric examination, requested by authority: Secondary | ICD-10-CM | POA: Diagnosis present

## 2019-01-14 DIAGNOSIS — J449 Chronic obstructive pulmonary disease, unspecified: Secondary | ICD-10-CM | POA: Diagnosis not present

## 2019-01-14 DIAGNOSIS — Z20828 Contact with and (suspected) exposure to other viral communicable diseases: Secondary | ICD-10-CM | POA: Diagnosis not present

## 2019-01-14 DIAGNOSIS — E119 Type 2 diabetes mellitus without complications: Secondary | ICD-10-CM | POA: Insufficient documentation

## 2019-01-14 DIAGNOSIS — F4325 Adjustment disorder with mixed disturbance of emotions and conduct: Secondary | ICD-10-CM | POA: Diagnosis present

## 2019-01-14 DIAGNOSIS — E039 Hypothyroidism, unspecified: Secondary | ICD-10-CM | POA: Insufficient documentation

## 2019-01-14 DIAGNOSIS — F3163 Bipolar disorder, current episode mixed, severe, without psychotic features: Secondary | ICD-10-CM | POA: Diagnosis present

## 2019-01-14 LAB — COMPREHENSIVE METABOLIC PANEL
ALT: 22 U/L (ref 0–44)
AST: 33 U/L (ref 15–41)
Albumin: 4.3 g/dL (ref 3.5–5.0)
Alkaline Phosphatase: 76 U/L (ref 38–126)
Anion gap: 8 (ref 5–15)
BUN: 25 mg/dL — ABNORMAL HIGH (ref 8–23)
CO2: 28 mmol/L (ref 22–32)
Calcium: 9.6 mg/dL (ref 8.9–10.3)
Chloride: 101 mmol/L (ref 98–111)
Creatinine, Ser: 1.38 mg/dL — ABNORMAL HIGH (ref 0.44–1.00)
GFR calc Af Amer: 46 mL/min — ABNORMAL LOW (ref >60)
GFR calc non Af Amer: 39 mL/min — ABNORMAL LOW (ref >60)
Glucose, Bld: 100 mg/dL — ABNORMAL HIGH (ref 70–99)
Potassium: 3.9 mmol/L (ref 3.5–5.1)
Sodium: 137 mmol/L (ref 135–145)
Total Bilirubin: 0.4 mg/dL (ref 0.3–1.2)
Total Protein: 7.6 g/dL (ref 6.5–8.1)

## 2019-01-14 LAB — SARS CORONAVIRUS 2 BY RT PCR (HOSPITAL ORDER, PERFORMED IN ~~LOC~~ HOSPITAL LAB): SARS Coronavirus 2: NEGATIVE

## 2019-01-14 LAB — RAPID URINE DRUG SCREEN, HOSP PERFORMED
Amphetamines: NOT DETECTED
Barbiturates: NOT DETECTED
Benzodiazepines: NOT DETECTED
Cocaine: NOT DETECTED
Opiates: NOT DETECTED
Tetrahydrocannabinol: NOT DETECTED

## 2019-01-14 LAB — CBC
HCT: 44.8 % (ref 36.0–46.0)
Hemoglobin: 14.3 g/dL (ref 12.0–15.0)
MCH: 28.9 pg (ref 26.0–34.0)
MCHC: 31.9 g/dL (ref 30.0–36.0)
MCV: 90.7 fL (ref 80.0–100.0)
Platelets: 264 10*3/uL (ref 150–400)
RBC: 4.94 MIL/uL (ref 3.87–5.11)
RDW: 16.3 % — ABNORMAL HIGH (ref 11.5–15.5)
WBC: 8.6 10*3/uL (ref 4.0–10.5)
nRBC: 0 % (ref 0.0–0.2)

## 2019-01-14 LAB — ETHANOL: Alcohol, Ethyl (B): <10 mg/dL (ref <10)

## 2019-01-14 LAB — ACETAMINOPHEN LEVEL: Acetaminophen (Tylenol), Serum: <10 ug/mL — ABNORMAL LOW (ref 10–30)

## 2019-01-14 LAB — SALICYLATE LEVEL: Salicylate Lvl: <7 mg/dL (ref 2.8–30.0)

## 2019-01-14 MED ORDER — ASPIRIN 81 MG PO CHEW
81.0000 mg | CHEWABLE_TABLET | Freq: Every day | ORAL | Status: DC
  Administered 2019-01-15: 81 mg via ORAL
  Filled 2019-01-14: qty 1

## 2019-01-14 MED ORDER — LEVOTHYROXINE SODIUM 200 MCG PO TABS
200.0000 ug | ORAL_TABLET | Freq: Every day | ORAL | Status: DC
  Administered 2019-01-15: 06:00:00 200 ug via ORAL
  Filled 2019-01-14: qty 1

## 2019-01-14 MED ORDER — RISPERIDONE 2 MG PO TABS
6.0000 mg | ORAL_TABLET | Freq: Every day | ORAL | Status: DC
  Administered 2019-01-14: 23:00:00 6 mg via ORAL
  Filled 2019-01-14: qty 3

## 2019-01-14 MED ORDER — LORATADINE 10 MG PO TABS
10.0000 mg | ORAL_TABLET | Freq: Every day | ORAL | Status: DC
  Administered 2019-01-15: 10 mg via ORAL
  Filled 2019-01-14: qty 1

## 2019-01-14 MED ORDER — ZOLPIDEM TARTRATE 10 MG PO TABS
10.0000 mg | ORAL_TABLET | Freq: Every evening | ORAL | Status: DC | PRN
  Administered 2019-01-14: 10 mg via ORAL
  Filled 2019-01-14: qty 1

## 2019-01-14 MED ORDER — FENOFIBRATE 160 MG PO TABS
160.0000 mg | ORAL_TABLET | Freq: Every day | ORAL | Status: DC
  Administered 2019-01-15: 160 mg via ORAL
  Filled 2019-01-14: qty 1

## 2019-01-14 MED ORDER — BENZTROPINE MESYLATE 0.5 MG PO TABS
0.5000 mg | ORAL_TABLET | Freq: Every day | ORAL | Status: DC
  Administered 2019-01-14: 23:00:00 0.5 mg via ORAL
  Filled 2019-01-14: qty 1

## 2019-01-14 MED ORDER — METFORMIN HCL 500 MG PO TABS
500.0000 mg | ORAL_TABLET | Freq: Every day | ORAL | Status: DC
  Administered 2019-01-15: 500 mg via ORAL
  Filled 2019-01-14: qty 1
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
## 2019-01-14 NOTE — ED Notes (Signed)
Requested something to help her sleep, HS meds didn't help her to sleep, Ambien given now for sleep.
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
## 2019-01-14 NOTE — ED Notes (Signed)
Report to Collie Siad in [place] to [employer] with security and 3 bags of belongings
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
## 2019-01-14 NOTE — ED Notes (Signed)
Dr Eulis Foster spoke with patient, she is labile, tearful when speaking with him. Psych to evaluate him in the am.

## 2019-01-15 DIAGNOSIS — F3163 Bipolar disorder, current episode mixed, severe, without psychotic features: Secondary | ICD-10-CM

## 2019-01-15 DIAGNOSIS — F4325 Adjustment disorder with mixed disturbance of emotions and conduct: Secondary | ICD-10-CM | POA: Diagnosis present

## 2019-01-15 MED ORDER — ACETAMINOPHEN 325 MG PO TABS
650.0000 mg | ORAL_TABLET | Freq: Four times a day (QID) | ORAL | Status: DC | PRN
  Administered 2019-01-15: 650 mg via ORAL
  Filled 2019-01-15: qty 2

## 2019-01-15 NOTE — Discharge Instructions (Addendum)
For your behavioral health needs, you are advised to follow up with your current outpatient provider.  You have had recent contact with the providers at [facility] and at [facility].  Contact your current provider at your earliest opportunity to schedule a follow up appointment:       [facility]      [address] [place], [place] [address]      [phone]       [facility]      [address]      [place], [place] [address]      [phone]

## 2019-01-15 NOTE — ED Notes (Signed)
Pt DCd off unit to home.  Pt alert, cooperative, calm, no s/s of distress. DC d information given and reviewed with pt with acknowledged understanding. Belongings given to pt. Pt ambulatory off unit, escorted with NT. Pt transported by sheriff.
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
## 2019-01-15 NOTE — Discharge Planning (Signed)
[facility] consulted regarding helping pt with PCP establishment. EDCM reviewed chart to find pt is insured.  EDCM advised pt to call customer service on card for provider accepting her insurance and availability.

## 2019-01-15 NOTE — Progress Notes (Signed)
CSW followed up with patient about her PCP needs. Patient reports she has a PCP in the [place] area, but could not remember the name of her PCP, but has a card with the PCP information in her purse. Patient reports she last saw her PCP "four weeks ago." CSW reiterated to patient to look on the back on her insurance card and call the insurance company customer service line to speak with them about providers in her area that accept her insurance.   Patient also reported that she needs transportation home. CSW called [place] Dept to transport patient back home now that she is cleared and her IVC has been rescinded. [place] Sheriffs reports they will be here soon to pick up patient.   [place], LCSW Transitions of Care Department [facility] ED [phone]

## 2019-01-15 NOTE — ED Notes (Signed)
Sleeping, lying quietly in bed, eyes closed and breathing regularly.

## 2019-01-15 NOTE — Consult Note (Addendum)
[facility] Psych ED Discharge  01/15/2019 1:58 PM Julie Morrow  MRN:  [id number] Principal Problem: Adjustment disorder with mixed disturbance of emotions and conduct Discharge Diagnoses: Principal Problem:   Bipolar 1 disorder, mixed, severe ([place])   Subjective: Patient IVC by sister after argument stemming from disagreement as to care of father who suffers from medical concerns including residual deficit post CVA. Patient alert and oriented for interview, appropriately answers all questions.  "My sister wants me in the hospital because she doesn't want me to have anything to do with our father, this happens all the time." Patient denies SI, HI and AVH. Patient lives with father x 15 years, will return to father's home. Patient cannot recall name of her outpatient psychiatrist, "she is new."   Total Time spent with patient: 30 minutes  Past Psychiatric History: Bipolar disorder  Past Medical History:  Past Medical History:  Diagnosis Date  . Anxiety   . Bipolar 1 disorder ([place])   . Bradycardia   . Depression   . Diabetes mellitus, type II ([place])    Patient takes Glucotrol and Januvia  . Hypothyroidism   . Lithium toxicity     Past Surgical History:  Procedure Laterality Date  . ABDOMINAL HYSTERECTOMY    . CHOLECYSTECTOMY     Family History:  Family History  Problem Relation Age of Onset  . Dementia Mother   . Arthritis Father    Family Psychiatric  History: As listed above.  Social History:  Social History   Substance and Sexual Activity  Alcohol Use No  . Alcohol/week: 0.0 standard drinks     Social History   Substance and Sexual Activity  Drug Use No    Social History   Socioeconomic History  . Marital status: Single    Spouse name: Not on file  . Number of children: Not on file  . Years of education: Not on file  . Highest education level: Not on file  Occupational History  . Not on file  Social Needs  . Financial resource strain: Not on file  . Food  insecurity    Worry: Not on file    Inability: Not on file  . Transportation needs    Medical: Not on file    Non-medical: Not on file  Tobacco Use  . Smoking status: Former Smoker    Packs/day: 0.50    Years: 20.00    Pack years: 10.00    Types: Cigarettes    Start date: 11/06/1984  . Smokeless tobacco: Never Used  Substance and Sexual Activity  . Alcohol use: No    Alcohol/week: 0.0 standard drinks  . Drug use: No  . Sexual activity: Never  Lifestyle  . Physical activity    Days per week: Not on file    Minutes per session: Not on file  . Stress: Not on file  Relationships  . Social Herbalist on phone: Not on file    Gets together: Not on file    Attends religious service: Not on file    Active member of club or organization: Not on file    Attends meetings of clubs or organizations: Not on file    Relationship status: Not on file  Other Topics Concern  . Not on file  Social History Narrative  . Not on file    Has this patient used any form of tobacco in the last 30 days? (Cigarettes, Smokeless Tobacco, Cigars, and/or Pipes) Prescription not provided because: Patient  does not use tobacco products.  Current Medications: Current Facility-Administered Medications  Medication Dose Route Frequency Provider Last Rate Last Dose  . acetaminophen (TYLENOL) tablet 650 mg  650 mg Oral Q6H PRN Nanavati, Ankit, MD      . aspirin chewable tablet 81 mg  81 mg Oral Daily Mancel Bale, MD   81 mg at 01/15/19 0518  . benztropine (COGENTIN) tablet 0.5 mg  0.5 mg Oral QHS Mancel Bale, MD   0.5 mg at 01/14/19 2308  . fenofibrate tablet 160 mg  160 mg Oral Q breakfast Mancel Bale, MD   160 mg at 01/15/19 0933  . levothyroxine (SYNTHROID) tablet 200 mcg  200 mcg Oral Q0600 Mancel Bale, MD   200 mcg at 01/15/19 3358  . loratadine (CLARITIN) tablet 10 mg  10 mg Oral Daily Mancel Bale, MD   10 mg at 01/15/19 2518  . metFORMIN (GLUCOPHAGE) tablet 500 mg  500 mg Oral Q  breakfast Mancel Bale, MD   500 mg at 01/15/19 9842  . risperiDONE (RISPERDAL) tablet 6 mg  6 mg Oral QHS Mancel Bale, MD   6 mg at 01/14/19 2308  . zolpidem (AMBIEN) tablet 10 mg  10 mg Oral QHS PRN Mancel Bale, MD   10 mg at 01/14/19 2335   Current Outpatient Medications  Medication Sig Dispense Refill  . aspirin 81 MG chewable tablet Chew 1 tablet (81 mg total) by mouth daily. 30 tablet 1  . benztropine (COGENTIN) 0.5 MG tablet TAKE 1 TABLET (0.5 MG TOTAL) BY MOUTH AT BEDTIME. 10 tablet 0  . citalopram (CELEXA) 20 MG tablet Take 20 mg by mouth daily.    . fenofibrate 160 MG tablet Take 1 tablet (160 mg total) by mouth daily with breakfast. 30 tablet 1  . levothyroxine (SYNTHROID) 200 MCG tablet Take 1 tablet (200 mcg total) by mouth daily before breakfast. 30 tablet 1  . loratadine (CLARITIN) 10 MG tablet Take 1 tablet (10 mg total) by mouth daily. 30 tablet 1  . metFORMIN (GLUCOPHAGE) 500 MG tablet Take 1 tablet (500 mg total) by mouth daily with breakfast. 30 tablet 1  . Oxcarbazepine (TRILEPTAL) 300 MG tablet Take 300 mg by mouth 2 (two) times daily.    . risperiDONE (RISPERDAL) 3 MG tablet Take 2 tablets (6 mg total) by mouth at bedtime. 60 tablet 1  . rosuvastatin (CRESTOR) 40 MG tablet Take 40 mg by mouth daily.    Marland Kitchen glipiZIDE (GLUCOTROL) 10 MG tablet Take 1 tablet (10 mg total) by mouth 2 (two) times daily before a meal. (Patient not taking: Reported on 01/14/2019) 60 tablet 1   PTA Medications: (Not in a hospital admission)   Musculoskeletal: Strength & Muscle Tone: within normal limits Gait & Station: normal Patient leans: N/A  Psychiatric Specialty Exam: Physical Exam  Nursing note and vitals reviewed. Constitutional: She is oriented to person, place, and time. She appears well-developed.  HENT:  Head: Normocephalic.  Cardiovascular: Normal rate.  Respiratory: Effort normal.  Neurological: She is alert and oriented to person, place, and time.  Psychiatric: Her  speech is normal and behavior is normal. Judgment and thought content normal. Her mood appears anxious (Patient anxious to discharge home, My dad asks me "when are you coming home?"). Cognition and memory are normal.    Review of Systems  Constitutional: Negative.   HENT: Negative.   Eyes: Negative.   Respiratory: Negative.   Cardiovascular: Negative.   Gastrointestinal: Negative.   Genitourinary: Negative.   Musculoskeletal: Negative.  Skin: Negative.   Neurological: Negative.   Endo/Heme/Allergies: Negative.   Psychiatric/Behavioral: Negative for depression, hallucinations, memory loss, substance abuse and suicidal ideas. The patient is nervous/anxious. The patient does not have insomnia.     Blood pressure 122/62, pulse 69, temperature 97.7 F (36.5 C), temperature source Oral, resp. rate 20, height 5\' 4"  (1.626 m), weight 113.4 kg, SpO2 96 %.Body mass index is 42.91 kg/m.  General Appearance: Casual  Eye Contact:  Good  Speech:  Clear and Coherent  Volume:  Normal  Mood:  appropriate  Affect:  Appropriate  Thought Process:  Coherent and Descriptions of Associations: Intact  Orientation:  Full (Time, Place, and Person)  Thought Content:  WDL  Suicidal Thoughts:  No  Homicidal Thoughts:  No  Memory:  Immediate;   Good Recent;   Good Remote;   Good  Judgement:  Intact  Insight:  Good  Psychomotor Activity:  Normal  Concentration:  Concentration: Good and Attention Span: Good  Recall:  Fair  Fund of Knowledge:  Good  Language:  Fair  Akathisia:  No  Handed:  Right  AIMS (if indicated):   N/A  Assets:  Communication Skills Housing Social Support  ADL's:  Intact  Cognition:  WNL  Sleep:   N/A     Demographic Factors:  Caucasian  Loss Factors: NA  Historical Factors: NA  Risk Reduction Factors:   Sense of responsibility to family, Living with another person, especially a relative, Positive social support and Positive therapeutic relationship  Continued  Clinical Symptoms:  Bipolar Disorder:   Depressive phase  Cognitive Features That Contribute To Risk:  None    Suicide Risk:  Minimal: No identifiable suicidal ideation.  Patients presenting with no risk factors but with morbid ruminations; may be classified as minimal risk based on the severity of the depressive symptoms    Plan Of Care/Follow-up recommendations: Follow up with outpatient provider   Disposition: Take all medications as prescribed. Please attend all follow-up appointments as scheduled. Report any side effects to your outpatient psychiatrist. Abstain from alcohol and illegal drugs while taking prescription medications. In the event of worsening symptoms call the crisis hotline, 911 or go to the nearest emergency department for evaluation and treatment.    Julie Dollyina L Tate, FNP 01/15/2019, 1:58 PM   Patient seen by telemedicine for psychiatric evaluation, chart reviewed and case discussed with the physician extender and developed treatment plan. Reviewed the information documented and agree with the treatment plan.  Julie BeetsJacqueline Shameek Nyquist, DO 01/15/19 5:50 PM

## 2019-01-16 ENCOUNTER — Ambulatory Visit (HOSPITAL_COMMUNITY)
Admission: AD | Admit: 2019-01-16 | Discharge: 2019-01-16 | Disposition: A | Discharge status: Home / Self Care | Payer: Medicare HMO | Attending: Psychiatry | Admitting: Psychiatry

## 2019-01-16 DIAGNOSIS — F419 Anxiety disorder, unspecified: Secondary | ICD-10-CM | POA: Insufficient documentation

## 2019-01-16 DIAGNOSIS — Z818 Family history of other mental and behavioral disorders: Secondary | ICD-10-CM | POA: Diagnosis not present

## 2019-01-16 DIAGNOSIS — F3132 Bipolar disorder, current episode depressed, moderate: Secondary | ICD-10-CM | POA: Insufficient documentation

## 2019-01-16 DIAGNOSIS — Z9049 Acquired absence of other specified parts of digestive tract: Secondary | ICD-10-CM | POA: Diagnosis not present

## 2019-01-16 DIAGNOSIS — E119 Type 2 diabetes mellitus without complications: Secondary | ICD-10-CM | POA: Diagnosis not present

## 2019-01-16 DIAGNOSIS — Z87891 Personal history of nicotine dependence: Secondary | ICD-10-CM | POA: Diagnosis not present
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
## 2019-02-26 ENCOUNTER — Other Ambulatory Visit: Payer: Self-pay

## 2019-02-26 DIAGNOSIS — Z20822 Contact with and (suspected) exposure to covid-19: Secondary | ICD-10-CM

## 2019-02-27 LAB — NOVEL CORONAVIRUS, NAA: SARS-CoV-2, NAA: NOT DETECTED

## 2019-03-02 ENCOUNTER — Telehealth: Payer: Self-pay | Admitting: General Practice

## 2019-03-02 NOTE — Telephone Encounter (Signed)
Negative COVID results given. Patient results "NOT Detected." Caller expressed understanding. ° °

## 2019-03-22 ENCOUNTER — Other Ambulatory Visit: Payer: Self-pay | Admitting: Physician Assistant

## 2019-03-23 NOTE — Telephone Encounter (Signed)
This was discontinued correct?

## 2019-03-29 ENCOUNTER — Other Ambulatory Visit: Payer: Self-pay | Admitting: Physician Assistant

## 2019-03-29 NOTE — Telephone Encounter (Signed)
Patient no longer is a patient here, seeing another psychiatrist at [facility]

## 2019-04-05 ENCOUNTER — Other Ambulatory Visit: Payer: Self-pay | Admitting: Physician Assistant

## 2019-05-12 ENCOUNTER — Other Ambulatory Visit: Payer: Self-pay | Admitting: Physician Assistant

## 2019-05-13 ENCOUNTER — Encounter (HOSPITAL_COMMUNITY): Payer: Self-pay

## 2019-05-13 ENCOUNTER — Inpatient Hospital Stay (HOSPITAL_COMMUNITY)
Admission: EM | Admit: 2019-05-13 | Discharge: 2019-05-22 | DRG: 689 | Disposition: A | Discharge status: Skilled Nursing Facility | Payer: Medicare HMO | Attending: Internal Medicine | Admitting: Internal Medicine

## 2019-05-13 ENCOUNTER — Other Ambulatory Visit: Payer: Self-pay

## 2019-05-13 DIAGNOSIS — R570 Cardiogenic shock: Secondary | ICD-10-CM | POA: Diagnosis present

## 2019-05-13 DIAGNOSIS — B9620 Unspecified Escherichia coli [E. coli] as the cause of diseases classified elsewhere: Secondary | ICD-10-CM | POA: Diagnosis present

## 2019-05-13 DIAGNOSIS — R400 Somnolence: Secondary | ICD-10-CM | POA: Diagnosis not present

## 2019-05-13 DIAGNOSIS — Z7984 Long term (current) use of oral hypoglycemic drugs: Secondary | ICD-10-CM

## 2019-05-13 DIAGNOSIS — Z79890 Hormone replacement therapy (postmenopausal): Secondary | ICD-10-CM

## 2019-05-13 DIAGNOSIS — E785 Hyperlipidemia, unspecified: Secondary | ICD-10-CM | POA: Diagnosis present

## 2019-05-13 DIAGNOSIS — E1140 Type 2 diabetes mellitus with diabetic neuropathy, unspecified: Secondary | ICD-10-CM | POA: Diagnosis present

## 2019-05-13 DIAGNOSIS — E871 Hypo-osmolality and hyponatremia: Secondary | ICD-10-CM | POA: Diagnosis present

## 2019-05-13 DIAGNOSIS — N390 Urinary tract infection, site not specified: Secondary | ICD-10-CM | POA: Diagnosis not present

## 2019-05-13 DIAGNOSIS — I10 Essential (primary) hypertension: Secondary | ICD-10-CM | POA: Diagnosis present

## 2019-05-13 DIAGNOSIS — R296 Repeated falls: Secondary | ICD-10-CM | POA: Diagnosis present

## 2019-05-13 DIAGNOSIS — E0781 Sick-euthyroid syndrome: Secondary | ICD-10-CM | POA: Diagnosis present

## 2019-05-13 DIAGNOSIS — R4182 Altered mental status, unspecified: Secondary | ICD-10-CM | POA: Diagnosis present

## 2019-05-13 DIAGNOSIS — Z20822 Contact with and (suspected) exposure to covid-19: Secondary | ICD-10-CM | POA: Diagnosis present

## 2019-05-13 DIAGNOSIS — Z8261 Family history of arthritis: Secondary | ICD-10-CM

## 2019-05-13 DIAGNOSIS — E1165 Type 2 diabetes mellitus with hyperglycemia: Secondary | ICD-10-CM | POA: Diagnosis not present

## 2019-05-13 DIAGNOSIS — E119 Type 2 diabetes mellitus without complications: Secondary | ICD-10-CM

## 2019-05-13 DIAGNOSIS — Z87891 Personal history of nicotine dependence: Secondary | ICD-10-CM

## 2019-05-13 DIAGNOSIS — F3163 Bipolar disorder, current episode mixed, severe, without psychotic features: Secondary | ICD-10-CM | POA: Diagnosis present

## 2019-05-13 DIAGNOSIS — Z7982 Long term (current) use of aspirin: Secondary | ICD-10-CM

## 2019-05-13 DIAGNOSIS — E039 Hypothyroidism, unspecified: Secondary | ICD-10-CM | POA: Diagnosis present

## 2019-05-13 DIAGNOSIS — Z6841 Body Mass Index (BMI) 40.0 and over, adult: Secondary | ICD-10-CM

## 2019-05-13 DIAGNOSIS — G92 Toxic encephalopathy: Secondary | ICD-10-CM | POA: Diagnosis present

## 2019-05-13 NOTE — ED Triage Notes (Signed)
Per ems: Pt coming from home c/o 3 falls within 24 hours. No injuries reported. Recently put on gabapentin for diabetic neuropathy. NIH-0   170 cbg 93 room air 152/84 20 rr 100 hr 97.2 temp

## 2019-05-14 ENCOUNTER — Emergency Department (HOSPITAL_COMMUNITY): Payer: Medicare HMO

## 2019-05-14 ENCOUNTER — Encounter (HOSPITAL_COMMUNITY): Payer: Self-pay | Admitting: Internal Medicine

## 2019-05-14 DIAGNOSIS — B9620 Unspecified Escherichia coli [E. coli] as the cause of diseases classified elsewhere: Secondary | ICD-10-CM | POA: Diagnosis present

## 2019-05-14 DIAGNOSIS — Z79890 Hormone replacement therapy (postmenopausal): Secondary | ICD-10-CM | POA: Diagnosis not present

## 2019-05-14 DIAGNOSIS — E119 Type 2 diabetes mellitus without complications: Secondary | ICD-10-CM | POA: Diagnosis not present

## 2019-05-14 DIAGNOSIS — I10 Essential (primary) hypertension: Secondary | ICD-10-CM | POA: Diagnosis present

## 2019-05-14 DIAGNOSIS — Z7982 Long term (current) use of aspirin: Secondary | ICD-10-CM | POA: Diagnosis not present

## 2019-05-14 DIAGNOSIS — Z8261 Family history of arthritis: Secondary | ICD-10-CM | POA: Diagnosis not present

## 2019-05-14 DIAGNOSIS — R4182 Altered mental status, unspecified: Secondary | ICD-10-CM | POA: Diagnosis not present

## 2019-05-14 DIAGNOSIS — Z20822 Contact with and (suspected) exposure to covid-19: Secondary | ICD-10-CM | POA: Diagnosis present

## 2019-05-14 DIAGNOSIS — R296 Repeated falls: Secondary | ICD-10-CM | POA: Diagnosis present

## 2019-05-14 DIAGNOSIS — F3163 Bipolar disorder, current episode mixed, severe, without psychotic features: Secondary | ICD-10-CM | POA: Diagnosis present

## 2019-05-14 DIAGNOSIS — G92 Toxic encephalopathy: Secondary | ICD-10-CM | POA: Diagnosis present

## 2019-05-14 DIAGNOSIS — N390 Urinary tract infection, site not specified: Secondary | ICD-10-CM

## 2019-05-14 DIAGNOSIS — E0781 Sick-euthyroid syndrome: Secondary | ICD-10-CM | POA: Diagnosis present

## 2019-05-14 DIAGNOSIS — Z6841 Body Mass Index (BMI) 40.0 and over, adult: Secondary | ICD-10-CM | POA: Diagnosis not present

## 2019-05-14 DIAGNOSIS — E871 Hypo-osmolality and hyponatremia: Secondary | ICD-10-CM | POA: Diagnosis present

## 2019-05-14 DIAGNOSIS — Z7984 Long term (current) use of oral hypoglycemic drugs: Secondary | ICD-10-CM | POA: Diagnosis not present

## 2019-05-14 DIAGNOSIS — E1140 Type 2 diabetes mellitus with diabetic neuropathy, unspecified: Secondary | ICD-10-CM | POA: Diagnosis present

## 2019-05-14 DIAGNOSIS — Z87891 Personal history of nicotine dependence: Secondary | ICD-10-CM | POA: Diagnosis not present

## 2019-05-14 DIAGNOSIS — E1165 Type 2 diabetes mellitus with hyperglycemia: Secondary | ICD-10-CM | POA: Diagnosis not present

## 2019-05-14 DIAGNOSIS — R400 Somnolence: Secondary | ICD-10-CM | POA: Diagnosis present

## 2019-05-14 DIAGNOSIS — R570 Cardiogenic shock: Secondary | ICD-10-CM | POA: Diagnosis present

## 2019-05-14 DIAGNOSIS — E785 Hyperlipidemia, unspecified: Secondary | ICD-10-CM | POA: Diagnosis present

## 2019-05-14 DIAGNOSIS — E039 Hypothyroidism, unspecified: Secondary | ICD-10-CM | POA: Diagnosis present

## 2019-05-14 LAB — RAPID URINE DRUG SCREEN, HOSP PERFORMED
Amphetamines: NOT DETECTED
Barbiturates: NOT DETECTED
Benzodiazepines: POSITIVE — AB
Cocaine: NOT DETECTED
Opiates: NOT DETECTED
Tetrahydrocannabinol: NOT DETECTED

## 2019-05-14 LAB — CBG MONITORING, ED
Glucose-Capillary: 101 mg/dL — ABNORMAL HIGH (ref 70–99)
Glucose-Capillary: 104 mg/dL — ABNORMAL HIGH (ref 70–99)
Glucose-Capillary: 149 mg/dL — ABNORMAL HIGH (ref 70–99)

## 2019-05-14 LAB — CBC WITH DIFFERENTIAL/PLATELET
Abs Immature Granulocytes: 0.1 10*3/uL — ABNORMAL HIGH (ref 0.00–0.07)
Basophils Absolute: 0 10*3/uL (ref 0.0–0.1)
Basophils Relative: 0 %
Eosinophils Absolute: 0.2 10*3/uL (ref 0.0–0.5)
Eosinophils Relative: 1 %
HCT: 43.9 % (ref 36.0–46.0)
Hemoglobin: 14 g/dL (ref 12.0–15.0)
Immature Granulocytes: 1 %
Lymphocytes Relative: 14 %
Lymphs Abs: 1.9 10*3/uL (ref 0.7–4.0)
MCH: 30.4 pg (ref 26.0–34.0)
MCHC: 31.9 g/dL (ref 30.0–36.0)
MCV: 95.2 fL (ref 80.0–100.0)
Monocytes Absolute: 1 10*3/uL (ref 0.1–1.0)
Monocytes Relative: 8 %
Neutro Abs: 10.1 10*3/uL — ABNORMAL HIGH (ref 1.7–7.7)
Neutrophils Relative %: 76 %
Platelets: 227 10*3/uL (ref 150–400)
RBC: 4.61 MIL/uL (ref 3.87–5.11)
RDW: 16 % — ABNORMAL HIGH (ref 11.5–15.5)
WBC: 13.4 10*3/uL — ABNORMAL HIGH (ref 4.0–10.5)
nRBC: 0 % (ref 0.0–0.2)

## 2019-05-14 LAB — BLOOD GAS, ARTERIAL
Acid-Base Excess: 1.2 mmol/L (ref 0.0–2.0)
Bicarbonate: 27.1 mmol/L (ref 20.0–28.0)
FIO2: 0.219
O2 Saturation: 86.3 %
Patient temperature: 98.2
pCO2 arterial: 50.3 mmHg — ABNORMAL HIGH (ref 32.0–48.0)
pH, Arterial: 7.35 (ref 7.350–7.450)
pO2, Arterial: 58.1 mmHg — ABNORMAL LOW (ref 83.0–108.0)

## 2019-05-14 LAB — COMPREHENSIVE METABOLIC PANEL
ALT: 34 U/L (ref 0–44)
AST: 38 U/L (ref 15–41)
Albumin: 4.1 g/dL (ref 3.5–5.0)
Alkaline Phosphatase: 71 U/L (ref 38–126)
Anion gap: 13 (ref 5–15)
BUN: 30 mg/dL — ABNORMAL HIGH (ref 8–23)
CO2: 25 mmol/L (ref 22–32)
Calcium: 9.2 mg/dL (ref 8.9–10.3)
Chloride: 94 mmol/L — ABNORMAL LOW (ref 98–111)
Creatinine, Ser: 1.04 mg/dL — ABNORMAL HIGH (ref 0.44–1.00)
GFR calc Af Amer: >60 mL/min (ref >60)
GFR calc non Af Amer: 55 mL/min — ABNORMAL LOW (ref >60)
Glucose, Bld: 140 mg/dL — ABNORMAL HIGH (ref 70–99)
Potassium: 4.3 mmol/L (ref 3.5–5.1)
Sodium: 132 mmol/L — ABNORMAL LOW (ref 135–145)
Total Bilirubin: 0.7 mg/dL (ref 0.3–1.2)
Total Protein: 7.1 g/dL (ref 6.5–8.1)

## 2019-05-14 LAB — URINALYSIS, ROUTINE W REFLEX MICROSCOPIC
Bilirubin Urine: NEGATIVE
Glucose, UA: NEGATIVE mg/dL
Hgb urine dipstick: NEGATIVE
Ketones, ur: NEGATIVE mg/dL
Nitrite: POSITIVE — AB
Protein, ur: NEGATIVE mg/dL
Specific Gravity, Urine: 1.012 (ref 1.005–1.030)
WBC, UA: >50 WBC/hpf — ABNORMAL HIGH (ref 0–5)
pH: 6 (ref 5.0–8.0)

## 2019-05-14 LAB — TSH: TSH: 10.688 u[IU]/mL — ABNORMAL HIGH (ref 0.350–4.500)

## 2019-05-14 LAB — T4, FREE: Free T4: 0.82 ng/dL (ref 0.61–1.12)

## 2019-05-14 LAB — SARS CORONAVIRUS 2 (TAT 6-24 HRS): SARS Coronavirus 2: NEGATIVE

## 2019-05-14 LAB — ETHANOL: Alcohol, Ethyl (B): <10 mg/dL (ref <10)

## 2019-05-14 LAB — AMMONIA: Ammonia: 14 umol/L (ref 9–35)

## 2019-05-14 LAB — GLUCOSE, CAPILLARY: Glucose-Capillary: 144 mg/dL — ABNORMAL HIGH (ref 70–99)

## 2019-05-14 LAB — LITHIUM LEVEL: Lithium Lvl: 0.07 mmol/L — ABNORMAL LOW (ref 0.60–1.20)

## 2019-05-14 MED ORDER — FENOFIBRATE 160 MG PO TABS
160.0000 mg | ORAL_TABLET | Freq: Every day | ORAL | Status: DC
  Administered 2019-05-14 – 2019-05-22 (×9): 160 mg via ORAL
  Filled 2019-05-14 (×9): qty 1

## 2019-05-14 MED ORDER — BENZTROPINE MESYLATE 0.5 MG PO TABS: 0.5000 mg | ORAL_TABLET | Freq: Every day | ORAL | Status: DC

## 2019-05-14 MED ORDER — OXCARBAZEPINE 300 MG PO TABS
300.0000 mg | ORAL_TABLET | Freq: Two times a day (BID) | ORAL | Status: DC
  Administered 2019-05-14 – 2019-05-22 (×17): 300 mg via ORAL
  Filled 2019-05-14 (×18): qty 1

## 2019-05-14 MED ORDER — SODIUM CHLORIDE 0.9 % IV BOLUS (SEPSIS)
1000.0000 mL | Freq: Once | INTRAVENOUS | Status: AC
  Administered 2019-05-14: 1000 mL via INTRAVENOUS

## 2019-05-14 MED ORDER — CITALOPRAM HYDROBROMIDE 20 MG PO TABS
20.0000 mg | ORAL_TABLET | Freq: Every day | ORAL | Status: DC
  Administered 2019-05-14 – 2019-05-22 (×9): 20 mg via ORAL
  Filled 2019-05-14 (×4): qty 1
  Filled 2019-05-14: qty 2
  Filled 2019-05-14 (×5): qty 1

## 2019-05-14 MED ORDER — ACETAMINOPHEN 325 MG PO TABS
650.0000 mg | ORAL_TABLET | Freq: Four times a day (QID) | ORAL | Status: DC | PRN
  Administered 2019-05-21: 650 mg via ORAL
  Filled 2019-05-14: qty 2

## 2019-05-14 MED ORDER — ALBUTEROL SULFATE HFA 108 (90 BASE) MCG/ACT IN AERS
8.0000 | INHALATION_SPRAY | Freq: Once | RESPIRATORY_TRACT | Status: AC
  Administered 2019-05-14: 03:00:00 8 via RESPIRATORY_TRACT
  Filled 2019-05-14: qty 6.7

## 2019-05-14 MED ORDER — ASPIRIN 81 MG PO CHEW
81.0000 mg | CHEWABLE_TABLET | Freq: Every day | ORAL | Status: DC
  Administered 2019-05-14 – 2019-05-22 (×9): 81 mg via ORAL
  Filled 2019-05-14 (×10): qty 1

## 2019-05-14 MED ORDER — SODIUM CHLORIDE 0.9 % IV SOLN
1.0000 g | Freq: Once | INTRAVENOUS | Status: AC
  Administered 2019-05-14: 1 g via INTRAVENOUS
  Filled 2019-05-14: qty 10

## 2019-05-14 MED ORDER — SODIUM CHLORIDE 0.9 % IV SOLN: INTRAVENOUS | Status: AC

## 2019-05-14 MED ORDER — LEVOTHYROXINE SODIUM 25 MCG PO TABS: 225.0000 ug | ORAL_TABLET | Freq: Every day | ORAL | Status: DC

## 2019-05-14 MED ORDER — ROSUVASTATIN CALCIUM 20 MG PO TABS
40.0000 mg | ORAL_TABLET | Freq: Every day | ORAL | Status: DC
  Administered 2019-05-14 – 2019-05-21 (×8): 40 mg via ORAL
  Filled 2019-05-14: qty 4
  Filled 2019-05-14: qty 2
  Filled 2019-05-14: qty 4
  Filled 2019-05-14 (×2): qty 2
  Filled 2019-05-14: qty 4
  Filled 2019-05-14: qty 2
  Filled 2019-05-14: qty 4
  Filled 2019-05-14: qty 2
  Filled 2019-05-14 (×2): qty 4
  Filled 2019-05-14: qty 2
  Filled 2019-05-14: qty 1
  Filled 2019-05-14 (×2): qty 2
  Filled 2019-05-14: qty 4

## 2019-05-14 MED ORDER — RISPERIDONE 1 MG PO TABS
6.0000 mg | ORAL_TABLET | Freq: Every day | ORAL | Status: DC
  Administered 2019-05-14 – 2019-05-16 (×3): 6 mg via ORAL
  Filled 2019-05-14 (×3): qty 6

## 2019-05-14 MED ORDER — SODIUM CHLORIDE 0.9 % IV SOLN
1.0000 g | INTRAVENOUS | Status: DC
  Administered 2019-05-15 – 2019-05-17 (×3): 1 g via INTRAVENOUS
  Filled 2019-05-14: qty 1
  Filled 2019-05-14: qty 10
  Filled 2019-05-14: qty 1
  Filled 2019-05-14: qty 10

## 2019-05-14 MED ORDER — ENOXAPARIN SODIUM 60 MG/0.6ML ~~LOC~~ SOLN
60.0000 mg | SUBCUTANEOUS | Status: DC
  Administered 2019-05-14 – 2019-05-22 (×9): 60 mg via SUBCUTANEOUS
  Filled 2019-05-14 (×9): qty 0.6

## 2019-05-14 MED ORDER — LEVOTHYROXINE SODIUM 75 MCG PO TABS
225.0000 ug | ORAL_TABLET | Freq: Every day | ORAL | Status: DC
  Administered 2019-05-14 – 2019-05-15 (×2): 225 ug via ORAL
  Filled 2019-05-14 (×2): qty 1

## 2019-05-14 MED ORDER — LORATADINE 10 MG PO TABS
10.0000 mg | ORAL_TABLET | Freq: Every day | ORAL | Status: DC
  Administered 2019-05-14 – 2019-05-22 (×9): 10 mg via ORAL
  Filled 2019-05-14 (×10): qty 1

## 2019-05-14 MED ORDER — ACETAMINOPHEN 650 MG RE SUPP: 650.0000 mg | Freq: Four times a day (QID) | RECTAL | Status: DC | PRN

## 2019-05-14 MED ORDER — INSULIN ASPART 100 UNIT/ML ~~LOC~~ SOLN
0.0000 [IU] | Freq: Three times a day (TID) | SUBCUTANEOUS | Status: DC
  Administered 2019-05-15 (×2): 1 [IU] via SUBCUTANEOUS
  Administered 2019-05-15: 2 [IU] via SUBCUTANEOUS
  Administered 2019-05-16: 1 [IU] via SUBCUTANEOUS
  Administered 2019-05-16 (×2): 2 [IU] via SUBCUTANEOUS
  Administered 2019-05-17 – 2019-05-19 (×7): 1 [IU] via SUBCUTANEOUS
  Administered 2019-05-19 – 2019-05-20 (×3): 2 [IU] via SUBCUTANEOUS
  Administered 2019-05-21: 1 [IU] via SUBCUTANEOUS
  Administered 2019-05-21: 2 [IU] via SUBCUTANEOUS
  Administered 2019-05-22: 1 [IU] via SUBCUTANEOUS
  Filled 2019-05-14: qty 0.09

## 2019-05-14 MED ORDER — FENOFIBRATE 160 MG PO TABS: 160.0000 mg | ORAL_TABLET | Freq: Every day | ORAL | Status: DC

## 2019-05-14 MED ORDER — INSULIN ASPART 100 UNIT/ML ~~LOC~~ SOLN
0.0000 [IU] | Freq: Every day | SUBCUTANEOUS | Status: DC
  Administered 2019-05-17: 2 [IU] via SUBCUTANEOUS
  Filled 2019-05-14: qty 0.05

## 2019-05-14 MED ORDER — ENOXAPARIN SODIUM 40 MG/0.4ML ~~LOC~~ SOLN: 40.0000 mg | SUBCUTANEOUS | Status: DC

## 2019-05-14 MED ORDER — METFORMIN HCL 500 MG PO TABS: 500.0000 mg | ORAL_TABLET | Freq: Every day | ORAL | Status: DC

## 2019-05-14 NOTE — ED Notes (Signed)
Attempted to call report. No answer.

## 2019-05-14 NOTE — ED Notes (Signed)
Patient assisted to the restroom with steady. Patient able to bare weight but still requires two person assistance. Patient states that she has chronic leg pain and has difficulty walking at baseline.

## 2019-05-14 NOTE — ED Notes (Signed)
Pt keeps trying to get out of bed to use the bathroom, Purewick is on Pt and is collecting the urine. Pt wants to get out of bed but is a fall risk and keeps trying to do things on her own. Call light is in reach, Changed pt bed linen due to spilled cup of ice in the bed from eating.

## 2019-05-14 NOTE — Evaluation (Signed)
Physical Therapy Evaluation Patient Details Name: Julie Morrow MRN: [id number] DOB: 02-04-1951 Today's Date: 05/14/2019   History of Present Illness  Julie Morrow  is a 68 y.o. female, w hypertension, hyperlipidemia, Dm2, Hypothyroidism, Bipolar do , apparently presents with 3 falls in the past 24 hours,  Per ED has AMS, somnolence.  Not safe to go home. Pt states recently started gabapentin, question if for neuropathy.    Clinical Impression  Julie Morrow is 68 y.o. female admitted with above HPI and diagnosis. Patient is currently limited by functional impairments below (see PT problem list). Patient lives with her father and reports she assists him, unclear to what extent. She reports modified independence with mobility at baseline with use of RW or SPC intermittently. Patient is questionable historian and requires min-mod assist for functional mobility at this time, she was limited by inability to control bladder during as well. Patient will benefit from continued skilled PT interventions to address impairments and progress independence with mobility, recommending 24/hour supervision/assistance with HHPT as pt does not wish to go to SNF at this time. Will continue to educated on SNF benefits throughout stay as uncertain if patient can obtain 24/7 assist. Acute PT will follow and progress as able.    Follow Up Recommendations Home health PT;Supervision/Assistance - 24 hour;Supervision for mobility/OOB(pt declining SNF and wishes to go home.)    Equipment Recommendations  None recommended by PT    Recommendations for Other Services OT consult     Precautions / Restrictions Precautions Precautions: Fall Precaution Comments: pt reprots 3 falls in last 24 hours Restrictions Weight Bearing Restrictions: No      Mobility  Bed Mobility Overal bed mobility: Needs Assistance Bed Mobility: Supine to Sit;Sit to Supine     Supine to sit: Min assist;HOB elevated Sit to supine: Mod  assist   General bed mobility comments: pt required verbal/tactile cues to sequence roll and to press up to raise trunk and sit EOB. assist required and cues to bring LE's back into bed to return to supine.  Transfers Overall transfer level: Needs assistance Equipment used: Rolling walker (2 wheeled) Transfers: Sit to/from Stand Sit to Stand: Min guard;Min assist         General transfer comment: pt able to complete power up without assist to stand with RW, close guarding required for safety. verbal/tactile cues and min assist for controled lowering required for stand<>sit transfer to toilet  Ambulation/Gait Ambulation/Gait assistance: Min assist Gait Distance (Feet): 24 Feet(2x12) Assistive device: Rolling walker (2 wheeled) Gait Pattern/deviations: Trunk flexed;Wide base of support;Shuffle;Decreased stride length;Step-through pattern;Decreased step length - right;Decreased step length - left;Trendelenburg Gait velocity: slow and unsteady   General Gait Details: pt unsteady and required min assist to prevent LOB, assist and verbal cues needed for safe management of RW.  Stairs            Wheelchair Mobility    Modified Rankin (Stroke Patients Only)       Balance Overall balance assessment: Needs assistance Sitting-balance support: Feet supported Sitting balance-Leahy Scale: Fair Sitting balance - Comments: pt required Max assist to doff soiled clothes in sitting for upper body and lower body   Standing balance support: During functional activity;Bilateral upper extremity supported Standing balance-Leahy Scale: Poor                Pertinent Vitals/Pain Pain Assessment: Faces Faces Pain Scale: No hurt    Home Living Family/patient expects to be discharged to:: Private residence Living Arrangements: Parent(lives  with her father) Available Help at Discharge: Family(pt states her and her sister care for her father) Type of Home: House Home Access: Stairs to  enter Entrance Stairs-Rails: Can reach both Entrance Stairs-Number of Steps: 3 Home Layout: One level;Laundry or work area in [place]: Environmental consultant - 2 wheels;Cane - single point      Prior Function Level of Independence: Needs assistance   Gait / Transfers Assistance Needed: pt amb short distances in home with SPC or RW occasionally, not using any device at this time  ADL's / Homemaking Assistance Needed: pt does not wear socks b/c she cannot don herself, needs assist for bath and dressing currently; pt reports normally I for mobilizing and toileting  Comments: pt questionable historian with unclear reliabiltiy of PLOF.     Hand Dominance   Dominant Hand: Right    Extremity/Trunk Assessment   Upper Extremity Assessment Upper Extremity Assessment: Generalized weakness    Lower Extremity Assessment Lower Extremity Assessment: Generalized weakness    Cervical / Trunk Assessment Cervical / Trunk Assessment: Kyphotic  Communication   Communication: No difficulties  Cognition Arousal/Alertness: Awake/alert Behavior During Therapy: WFL for tasks assessed/performed Overall Cognitive Status: No family/caregiver present to determine baseline cognitive functioning          General Comments: pt denies difficulty controlling bowel/bladder typically but upone therapist arrival pt had soiled bed linens and upon sitting continued to have trouble controlling her bladder      General Comments      Exercises     Assessment/Plan    PT Assessment Patient needs continued PT services  PT Problem List Decreased strength;Decreased balance;Decreased mobility;Decreased activity tolerance;Decreased knowledge of use of DME;Decreased safety awareness       PT Treatment Interventions DME instruction;Functional mobility training;Balance training;Patient/family education;Gait training;Therapeutic activities;Therapeutic exercise;Stair training    PT Goals (Current goals can be  found in the Care Plan section)  Acute Rehab PT Goals Patient Stated Goal: to return home and get more independent again PT Goal Formulation: With patient Time For Goal Achievement: 05/28/19 Potential to Achieve Goals: Fair    Frequency Min 3X/week    AM-PAC PT "6 Clicks" Mobility  Outcome Measure Help needed turning from your back to your side while in a flat bed without using bedrails?: A Little Help needed moving from lying on your back to sitting on the side of a flat bed without using bedrails?: A Lot Help needed moving to and from a bed to a chair (including a wheelchair)?: A Little Help needed standing up from a chair using your arms (e.g., wheelchair or bedside chair)?: A Little Help needed to walk in hospital room?: A Little Help needed climbing 3-5 steps with a railing? : A Lot 6 Click Score: 16    End of Session Equipment Utilized During Treatment: Gait belt Activity Tolerance: Patient tolerated treatment well Patient left: in bed;with call bell/phone within reach;with bed alarm set Nurse Communication: Mobility status PT Visit Diagnosis: Muscle weakness (generalized) (M62.81);Unsteadiness on feet (R26.81);Difficulty in walking, not elsewhere classified (R26.2);History of falling (Z91.81)    Time: 8280-0349 PT Time Calculation (min) (ACUTE ONLY): 33 min   Charges:   PT Evaluation $PT Eval Low Complexity: 1 Low PT Treatments $Therapeutic Activity: 8-22 mins        Gwynneth Albright, PT, DPT Physical Therapist with [facility]  05/14/2019 12:56 PM

## 2019-05-14 NOTE — ED Provider Notes (Signed)
TIME SEEN: 1:05 AM  CHIEF COMPLAINT: Multiple falls  HPI: Patient is a 68 year old female with history of bipolar disorder, diabetes, hypothyroidism who presents to the emergency department after she states she fell 3 times today.  States that "something is wrong with my legs".  She reports chronic numbness and burning pain that is unchanged and she feels this has led to her fall.  Patient states she does not use a cane or walker.  She is extremely somnolent here and falls asleep during questioning.  Denies any drug or alcohol use.  Denies fevers, cough, vomiting, diarrhea, back pain, bowel or bladder incontinence, new numbness or weakness, chest pain or shortness of breath.  States she does not think she hit her head or injured anything during her falls.  She is not on blood thinners.  She does not think that she is on sedative medications but cannot recall what medication she is taking.  ROS: See HPI Constitutional: no fever  Eyes: no drainage  ENT: no runny nose   Cardiovascular:  no chest pain  Resp: no SOB  GI: no vomiting GU: no dysuria Integumentary: no rash  Allergy: no hives  Musculoskeletal: no leg swelling  Neurological: no slurred speech ROS otherwise negative  PAST MEDICAL HISTORY/PAST SURGICAL HISTORY:  Past Medical History:  Diagnosis Date  . Anxiety   . Bipolar 1 disorder (HCC)   . Bradycardia   . Depression   . Diabetes mellitus, type II (HCC)    Patient takes Glucotrol and Januvia  . Hypothyroidism   . Lithium toxicity     MEDICATIONS:  Prior to Admission medications   Medication Sig Start Date End Date Taking? Authorizing Provider  aspirin 81 MG chewable tablet Chew 1 tablet (81 mg total) by mouth daily. 01/05/19   Clapacs, Jackquline Denmark, MD  benztropine (COGENTIN) 0.5 MG tablet TAKE 1 TABLET (0.5 MG TOTAL) BY MOUTH AT BEDTIME. 01/10/19   Clapacs, Jackquline Denmark, MD  citalopram (CELEXA) 20 MG tablet Take 20 mg by mouth daily. 12/29/18   [provider]  fenofibrate  160 MG tablet Take 1 tablet (160 mg total) by mouth daily with breakfast. 01/05/19   Clapacs, Jackquline Denmark, MD  levothyroxine (SYNTHROID) 200 MCG tablet Take 1 tablet (200 mcg total) by mouth daily before breakfast. 01/05/19   Clapacs, Jackquline Denmark, MD  loratadine (CLARITIN) 10 MG tablet Take 1 tablet (10 mg total) by mouth daily. 01/05/19   Clapacs, Jackquline Denmark, MD  metFORMIN (GLUCOPHAGE) 500 MG tablet Take 1 tablet (500 mg total) by mouth daily with breakfast. 01/05/19   Clapacs, Jackquline Denmark, MD  Oxcarbazepine (TRILEPTAL) 300 MG tablet Take 300 mg by mouth 2 (two) times daily. 01/06/19   [provider]  risperiDONE (RISPERDAL) 3 MG tablet Take 2 tablets (6 mg total) by mouth at bedtime. 01/05/19   Clapacs, Jackquline Denmark, MD  rosuvastatin (CRESTOR) 40 MG tablet Take 40 mg by mouth daily. 12/31/18   [provider]    ALLERGIES:  No Known Allergies  SOCIAL HISTORY:  Social History   Tobacco Use  . Smoking status: Former Smoker    Packs/day: 0.50    Years: 20.00    Pack years: 10.00    Types: Cigarettes    Start date: 11/06/1984  . Smokeless tobacco: Never Used  Substance Use Topics  . Alcohol use: No    Alcohol/week: 0.0 standard drinks    FAMILY HISTORY: Family History  Problem Relation Age of Onset  . Dementia Mother   .  Arthritis Father     EXAM: BP (!) 130/50 (BP Location: Left Arm)   Pulse 82   Temp 98.2 F (36.8 C) (Oral)   Resp 16   SpO2 92%  CONSTITUTIONAL: Alert and oriented and responds appropriately to questions intermittently but will fall asleep during questioning.  Obese.  In no distress. HEAD: Normocephalic, atraumatic EYES: Conjunctivae clear, pupils appear equal, EOM appear intact ENT: normal nose; moist mucous membranes NECK: Supple, normal ROM CARD: RRR; S1 and S2 appreciated; no murmurs, no clicks, no rubs, no gallops RESP: Normal chest excursion without splinting or tachypnea; breath sounds qual bilaterally; scattered expiratory wheezes, no rhonchi, no rales, no  hypoxia or respiratory distress, speaking full sentences ABD/GI: Normal bowel sounds; non-distended; soft, non-tender, no rebound, no guarding, no peritoneal signs, no hepatosplenomegaly BACK:  The back appears normal, no midline spinal tenderness or step-off or deformity EXT: Normal ROM in all joints; no deformity noted, no edema; no cyanosis, extremities are warm and well perfused, no calf tenderness or swelling, no joint effusion, no redness or warmth, no ecchymosis SKIN: Normal color for age and race; warm; no rash on exposed skin NEURO: Moves all extremities equally, reports diminished sensation in her distal lower extremities bilaterally which she states is chronic, otherwise normal sensation diffusely no saddle anesthesia, strength 5/5 in both lower extremities, patient was seen ambulating without assistance without ataxic or antalgic gait, no asterixis, no clonus, speech is normal, patient somnolent but arousable PSYCH: The patient's mood and manner are appropriate.   MEDICAL DECISION MAKING: Patient here with multiple falls.  She appears almost intoxicated currently.  Her blood glucose is normal.  She has had previous admission for lithium toxicity but does not appear that she is on lithium per her medical records but will check lithium level.  No other medications within her history that appear to be sedating in nature.  Will check alcohol level, drug screen, ABG, ammonia level.  Will obtain CT of the head.  Will check for signs of infection and dehydration.  ED PROGRESS: ABG is reassuring.  CT of the head shows no acute intracranial abnormality.  She has had a previous right occipital craniectomy and has stable postsurgical changes.  Her urine appears infected today.  We will send urine culture and start on Rocephin.  We do not have another previous positive urine culture in our system.  EKG shows no ischemia or arrhythmia.  Labs reassuring.  Ammonia level normal.  Drug screen positive for  benzodiazepines she has been monitored for hours without significant improvement.  She is protecting her airway.  Alcohol negative.  TSH is elevated but free T4 normal.  Lithium level undetectable.  Patient now a little more alert and is oriented x3 but still seems very drowsy.  She had difficulty getting to the bathroom and required significant assistance.  I feel she will need admission.  This may be from her UTI.  Will discuss with hospitalist.   6:38 AM Discussed patient's case with hospitalist, Dr. Maudie Mercury.  I have recommended admission and patient (and family if present) agree with this plan. Admitting physician will place admission orders.   I reviewed all nursing notes, vitals, pertinent previous records and interpreted all EKGs, lab and urine results, imaging (as available).     EKG Interpretation  Date/Time:  Monday May 14 2019 02:17:49 EST Ventricular Rate:  80 PR Interval:  214 QRS Duration: 106 QT Interval:  344 QTC Calculation: 396 R Axis:   32 Text Interpretation: Sinus  rhythm with 1st degree A-V block Otherwise normal ECG No significant change since last tracing Confirmed by Julie Morrow, Julie HireKristen [id number]) on 05/14/2019 2:29:37 AM        CRITICAL CARE Performed by: Julie Morrow   Total critical care time: 50 minutes  Critical care time was exclusive of separately billable procedures and treating other patients.  Critical care was necessary to treat or prevent imminent or life-threatening deterioration.  Critical care was time spent personally by me on the following activities: development of treatment plan with patient and/or surrogate as well as nursing, discussions with consultants, evaluation of patient's response to treatment, examination of patient, obtaining history from patient or surrogate, ordering and performing treatments and interventions, ordering and review of laboratory studies, ordering and review of radiographic studies, pulse oximetry and re-evaluation of  patient's condition.   Julie Morrow was evaluated in Emergency Department on 05/14/2019 for the symptoms described in the history of present illness. She was evaluated in the context of the global COVID-19 pandemic, which necessitated consideration that the patient might be at risk for infection with the SARS-CoV-2 virus that causes COVID-19. Institutional protocols and algorithms that pertain to the evaluation of patients at risk for COVID-19 are in a state of rapid change based on information released by regulatory bodies including the CDC and federal and state organizations. These policies and algorithms were followed during the patient's care in the ED.  Patient was seen wearing N95, face shield, gloves.    Unnamed Zeien, Layla MawKristen N, DO 05/14/19 [phone]

## 2019-05-14 NOTE — ED Notes (Signed)
Patient transported to CT 

## 2019-05-14 NOTE — ED Notes (Signed)
Pt assisted to the bathroom via the steady. Pt needing x2 assist to get to the bathroom. Pt able to provide peri care herself.

## 2019-05-14 NOTE — Progress Notes (Signed)
Patient admitted this morning by Dr Maudie Mercury.  68 year old patient with history of HTN, HLD, DM2, hypothyroidism, bipolar disorder admitted from home after multiple falls at home, change in mental status.  Diagnosed with urinary tract infection, elevated TSH.  Concerns of polypharmacy-secondary to gabapentin and benzodiazepine.  Her vitals are relatively stable. CT of the head-negative besides chronic changes and previous posterior right occipital craniotomy. Ammonia level-14 TSH elevated but free T4 normal. ABG-normal UA-consistent with urinary tract infection UDS-positive for benzodiazepine Alcohol level negative. COVID 19= pending.    At this point continue empiric IV Rocephin, follow-up urine culture data.  Gentle hydration, allow for drug washout.  Neurochecks.  Gabapentin on hold.  Supportive care.  We will discontinue Metformin, continue insulin sliding scale and Accu-Chek.   Gerlean Ren MD [facility]

## 2019-05-14 NOTE — ED Notes (Signed)
Called Hospitalist for EDP,Ward,MD@05 :48am.

## 2019-05-14 NOTE — ED Notes (Signed)
Upon arriving to the ED pt requested to go to the restroom. Pt ambulated from EMS stretcher to restroom and back to wheelchair with minimal assistance. Took pt back to hall D and pt would not get out of the wheel chair, pt stated she could not stand. Reminded pt that she had just went to the restroom and that she was able to get onto the stretcher. Pt climbed onto the stretcher but kept saying she couldn't get up on the bed. Pt got onto stretcher and instructed on how to push herself up in the bed.

## 2019-05-14 NOTE — ED Notes (Signed)
Family at bedside. 

## 2019-05-14 NOTE — Progress Notes (Signed)
ABG collected, lab notified and sample sent for analysis

## 2019-05-14 NOTE — ED Notes (Signed)
Julie Morrow [phone], Call when pt gets a room upstairs.

## 2019-05-14 NOTE — H&P (Signed)
TRH H&P    Patient Demographics:    Julie Morrow, is a 68 y.o. female  MRN: [id number]  DOB - 1951/02/26  Admit Date - 05/13/2019  Referring MD/NP/PA:  Ward  Outpatient Primary MD for the patient is Patient, No Pcp Per  Patient coming from: home  Chief complaint- altered mental status   HPI:    Julie Morrow  is a 68 y.o. female, w hypertension, hyperlipidemia, Dm2, Hypothyroidism, Bipolar do , apparently presents with 3 falls in the past 24 hours,  Per ED has AMS, somnolence.  Not safe to go home. Pt states recently started gabapentin, ? For neuropathy   In ED,  T 98.2, P 82  R 16, Bp 130/50  Pox 92% on RA  Wbc 13.4, Hgb 14.0, Plt 227 Na 132, K 4.3, Bun 30, creatinne 1.04 Glucose 140 Ast 38, Alt 34 ETOH <10 Ammonia 14 Urinalysis  Wbc >50, rbc 0-5 UDS + benzodiazepines Lithium 0.07 TSH 10.688 (prev 20.696)   CT brain IMPRESSION: No acute intracranial abnormality.  Findings consistent with age related atrophy and chronic small vessel ischemia  Posterior right occipital craniectomy with stable postsurgical Changes.   Pt will be admitted for AMS secondary to Acute lower UTI, abnormal tsh, benzodiazepines.    Review of systems:    In addition to the HPI above,  No Fever-chills, No Headache, No changes with Vision or hearing, No problems swallowing food or Liquids, No Chest pain, Cough or Shortness of Breath, No Abdominal pain, No Nausea or Vomiting, bowel movements are regular, No Blood in stool or Urine, No dysuria, No new skin rashes or bruises, No new joints pains-aches,  No new weakness, tingling, numbness in any extremity, No recent weight gain or loss, No polyuria, polydypsia or polyphagia, No significant Mental Stressors.  All other systems reviewed and are negative.    Past History of the following :    Past Medical History:  Diagnosis Date  . Anxiety   .  Bipolar 1 disorder (HCC)   . Bradycardia   . Depression   . Diabetes mellitus, type II (HCC)    Patient takes Glucotrol and Januvia  . Hypothyroidism   . Lithium toxicity       Past Surgical History:  Procedure Laterality Date  . ABDOMINAL HYSTERECTOMY    . CHOLECYSTECTOMY        Social History:      Social History   Tobacco Use  . Smoking status: Former Smoker    Packs/day: 0.50    Years: 20.00    Pack years: 10.00    Types: Cigarettes    Start date: 11/06/1984  . Smokeless tobacco: Never Used  Substance Use Topics  . Alcohol use: No    Alcohol/week: 0.0 standard drinks       Family History :     Family History  Problem Relation Age of Onset  . Dementia Mother   . Arthritis Father        Home Medications:   Prior to Admission medications   Medication Sig Start  Date End Date Taking? Authorizing Provider  aspirin 81 MG chewable tablet Chew 1 tablet (81 mg total) by mouth daily. 01/05/19   Clapacs, Madie Reno, MD  benztropine (COGENTIN) 0.5 MG tablet TAKE 1 TABLET (0.5 MG TOTAL) BY MOUTH AT BEDTIME. 01/10/19   Clapacs, Madie Reno, MD  citalopram (CELEXA) 20 MG tablet Take 20 mg by mouth daily. 12/29/18   [provider]  fenofibrate 160 MG tablet Take 1 tablet (160 mg total) by mouth daily with breakfast. 01/05/19   Clapacs, Madie Reno, MD  levothyroxine (SYNTHROID) 200 MCG tablet Take 1 tablet (200 mcg total) by mouth daily before breakfast. 01/05/19   Clapacs, Madie Reno, MD  loratadine (CLARITIN) 10 MG tablet Take 1 tablet (10 mg total) by mouth daily. 01/05/19   Clapacs, Madie Reno, MD  metFORMIN (GLUCOPHAGE) 500 MG tablet Take 1 tablet (500 mg total) by mouth daily with breakfast. 01/05/19   Clapacs, Madie Reno, MD  Oxcarbazepine (TRILEPTAL) 300 MG tablet Take 300 mg by mouth 2 (two) times daily. 01/06/19   [provider]  risperiDONE (RISPERDAL) 3 MG tablet Take 2 tablets (6 mg total) by mouth at bedtime. 01/05/19   Clapacs, Madie Reno, MD  rosuvastatin (CRESTOR) 40 MG  tablet Take 40 mg by mouth daily. 12/31/18   [provider]     Allergies:    No Known Allergies   Physical Exam:   Vitals  Blood pressure (!) 118/51, pulse 68, temperature 98.2 F (36.8 C), temperature source Oral, resp. rate 20, SpO2 96 %.  1.  General: axoxo3 (person, place, (hospital), time)  2. Psychiatric: euthymic  3. Neurologic: Nonfocal, cns 2-12 intact, reflexes 2+ symmetric, diffuse with no clonus, motor 5/5 in all 4 ext  4. HEENMT:  Anicteric, pupils 1.67mm symmetric, direct, consensual, near intact Neck: no jvd  5. Respiratory : CTAB  6. Cardiovascular : rrr s1, s2, no m/g/r  7. Gastrointestinal:  ABd: soft, nt, nd, +bs  8. Skin:  Ext: no c/c/e,  No rash  9.Musculoskeletal:  Good ROM    Data Review:    CBC Recent Labs  Lab 05/14/19 0130  WBC 13.4*  HGB 14.0  HCT 43.9  PLT 227  MCV 95.2  MCH 30.4  MCHC 31.9  RDW 16.0*  LYMPHSABS 1.9  MONOABS 1.0  EOSABS 0.2  BASOSABS 0.0   ------------------------------------------------------------------------------------------------------------------  Results for orders placed or performed during the hospital encounter of 05/13/19 (from the past 48 hour(s))  CBG monitoring, ED     Status: Abnormal   Collection Time: 05/14/19 12:38 AM  Result Value Ref Range   Glucose-Capillary 149 (H) 70 - 99 mg/dL  CBC with Differential     Status: Abnormal   Collection Time: 05/14/19  1:30 AM  Result Value Ref Range   WBC 13.4 (H) 4.0 - 10.5 K/uL   RBC 4.61 3.87 - 5.11 MIL/uL   Hemoglobin 14.0 12.0 - 15.0 g/dL   HCT 43.9 36.0 - 46.0 %   MCV 95.2 80.0 - 100.0 fL   MCH 30.4 26.0 - 34.0 pg   MCHC 31.9 30.0 - 36.0 g/dL   RDW 16.0 (H) 11.5 - 15.5 %   Platelets 227 150 - 400 K/uL   nRBC 0.0 0.0 - 0.2 %   Neutrophils Relative % 76 %   Neutro Abs 10.1 (H) 1.7 - 7.7 K/uL   Lymphocytes Relative 14 %   Lymphs Abs 1.9 0.7 - 4.0 K/uL   Monocytes Relative 8 %   Monocytes Absolute  1.0 0.1 - 1.0 K/uL    Eosinophils Relative 1 %   Eosinophils Absolute 0.2 0.0 - 0.5 K/uL   Basophils Relative 0 %   Basophils Absolute 0.0 0.0 - 0.1 K/uL   Immature Granulocytes 1 %   Abs Immature Granulocytes 0.10 (H) 0.00 - 0.07 K/uL    Comment: Performed at [facility], [address], [place], [place] [address]  Comprehensive metabolic panel     Status: Abnormal   Collection Time: 05/14/19  1:30 AM  Result Value Ref Range   Sodium 132 (L) 135 - 145 mmol/L   Potassium 4.3 3.5 - 5.1 mmol/L   Chloride 94 (L) 98 - 111 mmol/L   CO2 25 22 - 32 mmol/L   Glucose, Bld 140 (H) 70 - 99 mg/dL   BUN 30 (H) 8 - 23 mg/dL   Creatinine, Ser 6.04 (H) 0.44 - 1.00 mg/dL   Calcium 9.2 8.9 - 54.0 mg/dL   Total Protein 7.1 6.5 - 8.1 g/dL   Albumin 4.1 3.5 - 5.0 g/dL   AST 38 15 - 41 U/L   ALT 34 0 - 44 U/L   Alkaline Phosphatase 71 38 - 126 U/L   Total Bilirubin 0.7 0.3 - 1.2 mg/dL   GFR calc non Af Amer 55 (L) >60 mL/min   GFR calc Af Amer >60 >60 mL/min   Anion gap 13 5 - 15    Comment: Performed at [facility], [address], [place], [place] [address]  Ethanol     Status: None   Collection Time: 05/14/19  1:30 AM  Result Value Ref Range   Alcohol, Ethyl (B) <10 <10 mg/dL    Comment: (NOTE) Lowest detectable limit for serum alcohol is 10 mg/dL. For medical purposes only. Performed at [facility], [address] [place], [place] [address]   Ammonia     Status: None   Collection Time: 05/14/19  1:30 AM  Result Value Ref Range   Ammonia 14 9 - 35 umol/L    Comment: Performed at [facility], [address], [place], [place] [address]  Urinalysis, Routine w reflex microscopic     Status: Abnormal   Collection Time: 05/14/19  1:30 AM  Result Value Ref Range   Color, Urine YELLOW YELLOW   APPearance CLEAR CLEAR   Specific Gravity, Urine 1.012 1.005 - 1.030   pH 6.0 5.0 - 8.0   Glucose, UA NEGATIVE NEGATIVE mg/dL   Hgb urine  dipstick NEGATIVE NEGATIVE   Bilirubin Urine NEGATIVE NEGATIVE   Ketones, ur NEGATIVE NEGATIVE mg/dL   Protein, ur NEGATIVE NEGATIVE mg/dL   Nitrite POSITIVE (A) NEGATIVE   Leukocytes,Ua LARGE (A) NEGATIVE   RBC / HPF 0-5 0 - 5 RBC/hpf   WBC, UA >50 (H) 0 - 5 WBC/hpf   Bacteria, UA MANY (A) NONE SEEN   Squamous Epithelial / LPF 0-5 0 - 5   Mucus PRESENT    Budding Yeast PRESENT     Comment: Performed at [facility], [address], [place], [place] [address]  Urine rapid drug screen (hosp performed)not at [facility]     Status: Abnormal   Collection Time: 05/14/19  1:30 AM  Result Value Ref Range   Opiates NONE DETECTED NONE DETECTED   Cocaine NONE DETECTED NONE DETECTED   Benzodiazepines POSITIVE (A) NONE DETECTED   Amphetamines NONE DETECTED NONE DETECTED   Tetrahydrocannabinol NONE DETECTED NONE DETECTED   Barbiturates NONE DETECTED  NONE DETECTED    Comment: (NOTE) DRUG SCREEN FOR MEDICAL PURPOSES ONLY.  IF CONFIRMATION IS NEEDED FOR ANY PURPOSE, NOTIFY LAB WITHIN 5 DAYS. LOWEST DETECTABLE LIMITS FOR URINE DRUG SCREEN Drug Class                     Cutoff (ng/mL) Amphetamine and metabolites    1000 Barbiturate and metabolites    200 Benzodiazepine                 200 Tricyclics and metabolites     300 Opiates and metabolites        300 Cocaine and metabolites        300 THC                            50 Performed at [facility], [address] [place], [place] [address]   Blood gas, arterial (at [facility])     Status: Abnormal   Collection Time: 05/14/19  1:30 AM  Result Value Ref Range   FIO2 .2190    pH, Arterial 7.350 7.350 - 7.450   pCO2 arterial 50.3 (H) 32.0 - 48.0 mmHg   pO2, Arterial 58.1 (L) 83.0 - 108.0 mmHg   Bicarbonate 27.1 20.0 - 28.0 mmol/L   Acid-Base Excess 1.2 0.0 - 2.0 mmol/L   O2 Saturation 86.3 %   Patient temperature 98.2    Allens test (pass/fail) PASS PASS    Comment: Performed at [facility], [address] [place], [place] [address]  Lithium level     Status: Abnormal   Collection Time: 05/14/19  2:13 AM  Result Value Ref Range   Lithium Lvl 0.07 (L) 0.60 - 1.20 mmol/L    Comment: Performed at [facility], [address] [place], [place] [address]  TSH     Status: Abnormal   Collection Time: 05/14/19  2:13 AM  Result Value Ref Range   TSH 10.688 (H) 0.350 - 4.500 uIU/mL    Comment: Performed by a 3rd Generation assay with a functional sensitivity of <=0.01 uIU/mL. Performed at [facility], [address] [place], [place] [address]   T4, free     Status: None   Collection Time: 05/14/19  2:13 AM  Result Value Ref Range   Free T4 0.82 0.61 - 1.12 ng/dL    Comment: (NOTE) Biotin ingestion may interfere with free T4 tests. If the results are inconsistent with the TSH level, previous test results, or the clinical presentation, then consider biotin interference. If needed, order repeat testing after stopping biotin. Performed at [facility], [address] [place], [place] [address]     Chemistries  Recent Labs  Lab 05/14/19 0130  NA 132*  K 4.3  CL 94*  CO2 25  GLUCOSE 140*  BUN 30*  CREATININE 1.04*  CALCIUM 9.2  AST 38  ALT 34  ALKPHOS 71  BILITOT 0.7   ------------------------------------------------------------------------------------------------------------------  ------------------------------------------------------------------------------------------------------------------ GFR: CrCl cannot be calculated (Unknown ideal weight.). Liver Function Tests: Recent Labs  Lab 05/14/19 0130  AST 38  ALT 34  ALKPHOS 71  BILITOT 0.7  PROT 7.1  ALBUMIN 4.1   No results for input(s): LIPASE, AMYLASE in the last 168 hours. Recent Labs  Lab 05/14/19 0130  AMMONIA 14   Coagulation Profile: No results for input(s): INR, PROTIME in the last 168 hours. Cardiac Enzymes: No results  for  input(s): CKTOTAL, CKMB, CKMBINDEX, TROPONINI in the last 168 hours. BNP (last 3 results) No results for input(s): PROBNP in the last 8760 hours. HbA1C: No results for input(s): HGBA1C in the last 72 hours. CBG: Recent Labs  Lab 05/14/19 0038  GLUCAP 149*   Lipid Profile: No results for input(s): CHOL, HDL, LDLCALC, TRIG, CHOLHDL, LDLDIRECT in the last 72 hours. Thyroid Function Tests: Recent Labs    05/14/19 0213  TSH 10.688*  FREET4 0.82   Anemia Panel: No results for input(s): VITAMINB12, FOLATE, FERRITIN, TIBC, IRON, RETICCTPCT in the last 72 hours.  --------------------------------------------------------------------------------------------------------------- Urine analysis:    Component Value Date/Time   COLORURINE YELLOW 05/14/2019 0130   APPEARANCEUR CLEAR 05/14/2019 0130   APPEARANCEUR Cloudy 10/30/2012 1857   LABSPEC 1.012 05/14/2019 0130   LABSPEC 1.019 10/30/2012 1857   PHURINE 6.0 05/14/2019 0130   GLUCOSEU NEGATIVE 05/14/2019 0130   GLUCOSEU Negative 10/30/2012 1857   HGBUR NEGATIVE 05/14/2019 0130   BILIRUBINUR NEGATIVE 05/14/2019 0130   BILIRUBINUR Negative 10/30/2012 1857   KETONESUR NEGATIVE 05/14/2019 0130   PROTEINUR NEGATIVE 05/14/2019 0130   NITRITE POSITIVE (A) 05/14/2019 0130   LEUKOCYTESUR LARGE (A) 05/14/2019 0130   LEUKOCYTESUR Trace 10/30/2012 1857      Imaging Results:    CT Head Wo Contrast  Result Date: 05/14/2019 CLINICAL DATA:  Mental status change complaining of fall EXAM: CT HEAD WITHOUT CONTRAST TECHNIQUE: Contiguous axial images were obtained from the base of the skull through the vertex without intravenous contrast. COMPARISON:  October 24, 2018 FINDINGS: Brain: No evidence of acute territorial infarction, hemorrhage, hydrocephalus,extra-axial collection or mass lesion/mass effect. Again noted is the right posterior occipital lobe are craniectomy changes with areas of encephalomalacia and postsurgical cyst at the craniectomy site  which is stable dating back to 2019. There is dilatation the ventricles and sulci consistent with age-related atrophy. Low-attenuation changes in the deep white matter consistent with small vessel ischemia. Vascular: No hyperdense vessel or unexpected calcification. Skull: Posterior craniectomy site at the right occipital lobe. No acute fracture seen. Sinuses/Orbits: The visualized paranasal sinuses and mastoid air cells are clear. The orbits and globes intact. Other: None IMPRESSION: No acute intracranial abnormality. Findings consistent with age related atrophy and chronic small vessel ischemia Posterior right occipital craniectomy with stable postsurgical changes. Electronically Signed   By: Jonna Clark M.D.   On: 05/14/2019 01:54   DG Chest Portable 1 View  Result Date: 05/14/2019 CLINICAL DATA:  Altered mental status EXAM: PORTABLE CHEST 1 VIEW COMPARISON:  Radiograph December 27, 2018 FINDINGS: Low volumes with streaky atelectatic changes. No consolidation, features of edema, pneumothorax, or effusion. Suspect cardiomegaly though likely accentuated by low volumes and portable technique. Remaining cardiomediastinal contours are unremarkable. No acute osseous or soft tissue abnormality. Degenerative changes are present in the imaged spine and shoulders. IMPRESSION: 1. Low volume chest x-ray with streaky opacities favoring atelectatic change. 2. Suspect cardiomegaly though likely accentuated by low volumes and portable technique. Electronically Signed   By: Kreg Shropshire M.D.   On: 05/14/2019 02:32       Assessment & Plan:    Principal Problem:   AMS (altered mental status) Active Problems:   HLD (hyperlipidemia)   Diabetes type 2, controlled (HCC)   Hypothyroidism   Bipolar 1 disorder, mixed, severe (HCC)   Acute lower UTI   AMS Secondary to Acute lower UTI, abnormal TSH, benzos  Acute lower uti Urine culture Rocephin 1gm iv qday  Gait instability ? Gabapentin vs uti PT  to evaluate and  tx  Hyponatremia hydratre with ns iv Check cmp in am  Hypothyroidism Increase Synthroid to 225 micrograms po qday  Hyperlipidemia Cont Fenofibrate 160mg  po qday Cont Crestor 40mg  po qday  Dm2 Cont Metformin  No Gabapentin for now Fsbs ac and qhs, ISS  Bipolar do Cont Risperdal  Cont Trileptal Cont Celexa No Gabapentin for now  ? Can't tell if on lithiium or not  DVT Prophylaxis-   Lovenox - SCDs   AM Labs Ordered, also please review Full Orders  Family Communication: Admission, patients condition and plan of care including tests being ordered have been discussed with the patient  who indicate understanding and agree with the plan and Code Status.  Code Status:  FULL CODE per patient, notified sister that patient admitted to [facility]  Admission status: Observation: Based on patients clinical presentation and evaluation of above clinical data, I have made determination that patient meets Observation criteria at this time.   Time spent in minutes : 55 minutes   M.D on 05/14/2019 at 6:45 AM

## 2019-05-14 NOTE — ED Notes (Signed)
ED TO INPATIENT HANDOFF REPORT  ED Nurse Name and Phone #: Hilaria Ota Name/Age/Gender Julie Morrow 68 y.o. female Room/Bed: WA31/WA31  Code Status   Code Status: Full Code  Home/SNF/Other Home Patient oriented to: self, place, time and situation Is this baseline? Yes   Triage Complete: Triage complete  Chief Complaint AMS (altered mental status) [R41.82]  Triage Note Per ems: Pt coming from home c/o 3 falls within 24 hours. No injuries reported. Recently put on gabapentin for diabetic neuropathy. NIH-0   170 cbg 93 room air 152/84 20 rr 100 hr 97.2 temp    Allergies No Known Allergies  Level of Care/Admitting Diagnosis ED Disposition    ED Disposition Condition Comment   Admit  Hospital Area: [facility] [[id number]]  Level of Care: Telemetry [5]  Admit to tele based on following criteria: Monitor for Ischemic changes  Covid Evaluation: Asymptomatic Screening Protocol (No Symptoms)  Diagnosis: AMS (altered mental status) [1610960]  Admitting Physician: Pearson Grippe [[id number]]  Attending Physician: Pearson Grippe [phone]  Estimated length of stay: past midnight tomorrow  Certification:: I certify this patient will need inpatient services for at least 2 midnights       B Medical/Surgery History Past Medical History:  Diagnosis Date  . Anxiety   . Bipolar 1 disorder (HCC)   . Bradycardia   . Depression   . Diabetes mellitus, type II (HCC)    Patient takes Glucotrol and Januvia  . Hypothyroidism   . Lithium toxicity    Past Surgical History:  Procedure Laterality Date  . ABDOMINAL HYSTERECTOMY    . CHOLECYSTECTOMY       A IV Location/Drains/Wounds Patient Lines/Drains/Airways Status   Active Line/Drains/Airways    Name:   Placement date:   Placement time:   Site:   Days:   Peripheral IV 05/14/19 Right Wrist   05/14/19    0214    Wrist   less than 1          Intake/Output Last 24 hours  Intake/Output Summary (Last 24 hours) at  05/14/2019 1833 Last data filed at 05/14/2019 0756 Gross per 24 hour  Intake 1090 ml  Output -  Net 1090 ml    Labs/Imaging Results for orders placed or performed during the hospital encounter of 05/13/19 (from the past 48 hour(s))  CBG monitoring, ED     Status: Abnormal   Collection Time: 05/14/19 12:38 AM  Result Value Ref Range   Glucose-Capillary 149 (H) 70 - 99 mg/dL  CBC with Differential     Status: Abnormal   Collection Time: 05/14/19  1:30 AM  Result Value Ref Range   WBC 13.4 (H) 4.0 - 10.5 K/uL   RBC 4.61 3.87 - 5.11 MIL/uL   Hemoglobin 14.0 12.0 - 15.0 g/dL   HCT 98.1 19.1 - 47.8 %   MCV 95.2 80.0 - 100.0 fL   MCH 30.4 26.0 - 34.0 pg   MCHC 31.9 30.0 - 36.0 g/dL   RDW 29.5 (H) 62.1 - 30.8 %   Platelets 227 150 - 400 K/uL   nRBC 0.0 0.0 - 0.2 %   Neutrophils Relative % 76 %   Neutro Abs 10.1 (H) 1.7 - 7.7 K/uL   Lymphocytes Relative 14 %   Lymphs Abs 1.9 0.7 - 4.0 K/uL   Monocytes Relative 8 %   Monocytes Absolute 1.0 0.1 - 1.0 K/uL   Eosinophils Relative 1 %   Eosinophils Absolute 0.2 0.0 - 0.5 K/uL  Basophils Relative 0 %   Basophils Absolute 0.0 0.0 - 0.1 K/uL   Immature Granulocytes 1 %   Abs Immature Granulocytes 0.10 (H) 0.00 - 0.07 K/uL    Comment: Performed at [facility], [address] [place], [place] [address]  Comprehensive metabolic panel     Status: Abnormal   Collection Time: 05/14/19  1:30 AM  Result Value Ref Range   Sodium 132 (L) 135 - 145 mmol/L   Potassium 4.3 3.5 - 5.1 mmol/L   Chloride 94 (L) 98 - 111 mmol/L   CO2 25 22 - 32 mmol/L   Glucose, Bld 140 (H) 70 - 99 mg/dL   BUN 30 (H) 8 - 23 mg/dL   Creatinine, Ser 7.82 (H) 0.44 - 1.00 mg/dL   Calcium 9.2 8.9 - 95.6 mg/dL   Total Protein 7.1 6.5 - 8.1 g/dL   Albumin 4.1 3.5 - 5.0 g/dL   AST 38 15 - 41 U/L   ALT 34 0 - 44 U/L   Alkaline Phosphatase 71 38 - 126 U/L   Total Bilirubin 0.7 0.3 - 1.2 mg/dL   GFR calc non Af Amer 55 (L) >60 mL/min   GFR calc Af  Amer >60 >60 mL/min   Anion gap 13 5 - 15    Comment: Performed at [facility], [address], [place], [place] [address]  Ethanol     Status: None   Collection Time: 05/14/19  1:30 AM  Result Value Ref Range   Alcohol, Ethyl (B) <10 <10 mg/dL    Comment: (NOTE) Lowest detectable limit for serum alcohol is 10 mg/dL. For medical purposes only. Performed at [facility], [address] [place], [place] [address]   Ammonia     Status: None   Collection Time: 05/14/19  1:30 AM  Result Value Ref Range   Ammonia 14 9 - 35 umol/L    Comment: Performed at [facility], [address], [place], [place] [address]  Urinalysis, Routine w reflex microscopic     Status: Abnormal   Collection Time: 05/14/19  1:30 AM  Result Value Ref Range   Color, Urine YELLOW YELLOW   APPearance CLEAR CLEAR   Specific Gravity, Urine 1.012 1.005 - 1.030   pH 6.0 5.0 - 8.0   Glucose, UA NEGATIVE NEGATIVE mg/dL   Hgb urine dipstick NEGATIVE NEGATIVE   Bilirubin Urine NEGATIVE NEGATIVE   Ketones, ur NEGATIVE NEGATIVE mg/dL   Protein, ur NEGATIVE NEGATIVE mg/dL   Nitrite POSITIVE (A) NEGATIVE   Leukocytes,Ua LARGE (A) NEGATIVE   RBC / HPF 0-5 0 - 5 RBC/hpf   WBC, UA >50 (H) 0 - 5 WBC/hpf   Bacteria, UA MANY (A) NONE SEEN   Squamous Epithelial / LPF 0-5 0 - 5   Mucus PRESENT    Budding Yeast PRESENT     Comment: Performed at [facility], [address] [place], [place] [address]  Urine rapid drug screen (hosp performed)not at [facility]     Status: Abnormal   Collection Time: 05/14/19  1:30 AM  Result Value Ref Range   Opiates NONE DETECTED NONE DETECTED   Cocaine NONE DETECTED NONE DETECTED   Benzodiazepines POSITIVE (A) NONE DETECTED   Amphetamines NONE DETECTED NONE DETECTED   Tetrahydrocannabinol NONE DETECTED NONE DETECTED   Barbiturates NONE DETECTED NONE DETECTED    Comment: (NOTE) DRUG SCREEN FOR MEDICAL PURPOSES ONLY.   IF CONFIRMATION IS NEEDED FOR ANY PURPOSE, NOTIFY  LAB WITHIN 5 DAYS. LOWEST DETECTABLE LIMITS FOR URINE DRUG SCREEN Drug Class                     Cutoff (ng/mL) Amphetamine and metabolites    1000 Barbiturate and metabolites    200 Benzodiazepine                 347 Tricyclics and metabolites     300 Opiates and metabolites        300 Cocaine and metabolites        300 THC                            50 Performed at [facility], [place] [address] [place], [place] [address]   Blood gas, arterial (at [facility])     Status: Abnormal   Collection Time: 05/14/19  1:30 AM  Result Value Ref Range   FIO2 .2190    pH, Arterial 7.350 7.350 - 7.450   pCO2 arterial 50.3 (H) 32.0 - 48.0 mmHg   pO2, Arterial 58.1 (L) 83.0 - 108.0 mmHg   Bicarbonate 27.1 20.0 - 28.0 mmol/L   Acid-Base Excess 1.2 0.0 - 2.0 mmol/L   O2 Saturation 86.3 %   Patient temperature 98.2    Allens test (pass/fail) PASS PASS    Comment: Performed at [facility], [place] [address], [place], [place] [address]  Lithium level     Status: Abnormal   Collection Time: 05/14/19  2:13 AM  Result Value Ref Range   Lithium Lvl 0.07 (L) 0.60 - 1.20 mmol/L    Comment: Performed at [facility], [place] [address], [place], [place] [address]  TSH     Status: Abnormal   Collection Time: 05/14/19  2:13 AM  Result Value Ref Range   TSH 10.688 (H) 0.350 - 4.500 uIU/mL    Comment: Performed by a 3rd Generation assay with a functional sensitivity of <=0.01 uIU/mL. Performed at [facility], [place] [address], [place],  [address]   T4, free     Status: None   Collection Time: 05/14/19  2:13 AM  Result Value Ref Range   Free T4 0.82 0.61 - 1.12 ng/dL    Comment: (NOTE) Biotin ingestion may interfere with free T4 tests. If the results are inconsistent with the TSH level, previous test results, or the clinical presentation, then consider biotin interference. If  needed, order repeat testing after stopping biotin. Performed at [facility], [place] [address], [place], [place] [address]   SARS CORONAVIRUS 2 (TAT 6-24 HRS) Nasopharyngeal Nasopharyngeal Swab     Status: None   Collection Time: 05/14/19  6:27 AM   Specimen: Nasopharyngeal Swab  Result Value Ref Range   SARS Coronavirus 2 NEGATIVE NEGATIVE    Comment: (NOTE) SARS-CoV-2 target nucleic acids are NOT DETECTED. The SARS-CoV-2 RNA is generally detectable in upper and lower respiratory specimens during the acute phase of infection. Negative results do not preclude SARS-CoV-2 infection, do not rule out co-infections with other pathogens, and should not be used as the sole basis for treatment or other patient management decisions. Negative results must be combined with clinical observations, patient history, and epidemiological information. The expected result is Negative. Fact Sheet for Patients: SugarRoll.be Fact Sheet for Healthcare Providers: https://www.woods-mathews.com/ This test is not yet approved or cleared by the [place] FDA and  has been authorized for  detection and/or diagnosis of SARS-CoV-2 by FDA under an Emergency Use Authorization (EUA). This EUA will remain  in effect (meaning this test can be used) for the duration of the COVID-19 declaration under Section 56 4(b)(1) of the Act, 21 U.S.C. section 360bbb-3(b)(1), unless the authorization is terminated or revoked sooner. Performed at [facility], [address] [place], [place] [address]   CBG monitoring, ED     Status: Abnormal   Collection Time: 05/14/19 11:10 AM  Result Value Ref Range   Glucose-Capillary 101 (H) 70 - 99 mg/dL   Comment 1 Notify RN   CBG monitoring, ED     Status: Abnormal   Collection Time: 05/14/19  4:46 PM  Result Value Ref Range   Glucose-Capillary 104 (H) 70 - 99 mg/dL   Comment 1 Notify RN    Comment 2 Document in Chart    CT  Head Wo Contrast  Result Date: 05/14/2019 CLINICAL DATA:  Mental status change complaining of fall EXAM: CT HEAD WITHOUT CONTRAST TECHNIQUE: Contiguous axial images were obtained from the base of the skull through the vertex without intravenous contrast. COMPARISON:  October 24, 2018 FINDINGS: Brain: No evidence of acute territorial infarction, hemorrhage, hydrocephalus,extra-axial collection or mass lesion/mass effect. Again noted is the right posterior occipital lobe are craniectomy changes with areas of encephalomalacia and postsurgical cyst at the craniectomy site which is stable dating back to 2019. There is dilatation the ventricles and sulci consistent with age-related atrophy. Low-attenuation changes in the deep white matter consistent with small vessel ischemia. Vascular: No hyperdense vessel or unexpected calcification. Skull: Posterior craniectomy site at the right occipital lobe. No acute fracture seen. Sinuses/Orbits: The visualized paranasal sinuses and mastoid air cells are clear. The orbits and globes intact. Other: None IMPRESSION: No acute intracranial abnormality. Findings consistent with age related atrophy and chronic small vessel ischemia Posterior right occipital craniectomy with stable postsurgical changes. Electronically Signed   By: Jonna ClarkBindu  Avutu M.D.   On: 05/14/2019 01:54   DG Chest Portable 1 View  Result Date: 05/14/2019 CLINICAL DATA:  Altered mental status EXAM: PORTABLE CHEST 1 VIEW COMPARISON:  Radiograph December 27, 2018 FINDINGS: Low volumes with streaky atelectatic changes. No consolidation, features of edema, pneumothorax, or effusion. Suspect cardiomegaly though likely accentuated by low volumes and portable technique. Remaining cardiomediastinal contours are unremarkable. No acute osseous or soft tissue abnormality. Degenerative changes are present in the imaged spine and shoulders. IMPRESSION: 1. Low volume chest x-ray with streaky opacities favoring atelectatic change. 2.  Suspect cardiomegaly though likely accentuated by low volumes and portable technique. Electronically Signed   By: Kreg ShropshirePrice  DeHay M.D.   On: 05/14/2019 02:32    Pending Labs Unresulted Labs (From admission, onward)    Start     Ordered   05/21/19 0500  Creatinine, serum  (enoxaparin (LOVENOX)    CrCl >/= 30 ml/min)  Weekly,   R    Comments: while on enoxaparin therapy    05/14/19 0643   05/15/19 0500  HIV Antibody (routine testing w rflx)  (HIV Antibody (Routine testing w reflex) panel)  Tomorrow morning,   R     05/14/19 0643   05/15/19 0500  Comprehensive metabolic panel  Tomorrow morning,   R     05/14/19 0643   05/15/19 0500  CBC  Tomorrow morning,   R     05/14/19 0643   05/14/19 0229  Urine culture  ONCE - STAT,   STAT     05/14/19 [id number]  05/14/19 0133  T3, free  ONCE - STAT,   STAT     05/14/19 0132          Vitals/Pain Today's Vitals   05/14/19 0541 05/14/19 0639 05/14/19 0648 05/14/19 1748  BP: 94/73 (!) 118/51  (!) 128/57  Pulse: 75 68  84  Resp: 19 20  17   Temp:    98.6 F (37 C)  TempSrc:    Oral  SpO2: 100% 96%  95%  Weight:   113.4 kg   Height:   5\' 3"  (1.6 m)   PainSc:        Isolation Precautions No active isolations  Medications Medications  0.9 %  sodium chloride infusion ( Intravenous New Bag/Given 05/14/19 0659)  acetaminophen (TYLENOL) tablet 650 mg (has no administration in time range)    Or  acetaminophen (TYLENOL) suppository 650 mg (has no administration in time range)  insulin aspart (novoLOG) injection 0-9 Units (0 Units Subcutaneous Not Given 05/14/19 1234)  insulin aspart (novoLOG) injection 0-5 Units (has no administration in time range)  aspirin chewable tablet 81 mg (81 mg Oral Given 05/14/19 1012)  rosuvastatin (CRESTOR) tablet 40 mg (has no administration in time range)  citalopram (CELEXA) tablet 20 mg (20 mg Oral Given 05/14/19 1012)  risperiDONE (RISPERDAL) tablet 6 mg (has no administration in time range)  Oxcarbazepine  (TRILEPTAL) tablet 300 mg (300 mg Oral Given 05/14/19 1012)  loratadine (CLARITIN) tablet 10 mg (10 mg Oral Given 05/14/19 1012)  cefTRIAXone (ROCEPHIN) 1 g in sodium chloride 0.9 % 100 mL IVPB (has no administration in time range)  enoxaparin (LOVENOX) injection 60 mg (60 mg Subcutaneous Given 05/14/19 1014)  fenofibrate tablet 160 mg (160 mg Oral Given 05/14/19 1238)  levothyroxine (SYNTHROID) tablet 225 mcg (225 mcg Oral Given 05/14/19 1237)  albuterol (VENTOLIN HFA) 108 (90 Base) MCG/ACT inhaler 8 puff (8 puffs Inhalation Given 05/14/19 0253)  cefTRIAXone (ROCEPHIN) 1 g in sodium chloride 0.9 % 100 mL IVPB (0 g Intravenous Stopped 05/14/19 0525)  sodium chloride 0.9 % bolus 1,000 mL (0 mLs Intravenous Stopped 05/14/19 0756)    Mobility walks with person assist High fall risk   Focused Assessments NA   R Recommendations: See Admitting Provider Note  Report given to:   Additional Notes: NA

## 2019-05-14 NOTE — ED Notes (Signed)
Patient assisted to restroom using steady. Patient requires two person assistance to make it to restroom safely. Patient back in bed and denies any needs at this time.

## 2019-05-15 DIAGNOSIS — E039 Hypothyroidism, unspecified: Secondary | ICD-10-CM

## 2019-05-15 DIAGNOSIS — E782 Mixed hyperlipidemia: Secondary | ICD-10-CM

## 2019-05-15 LAB — COMPREHENSIVE METABOLIC PANEL
ALT: 27 U/L (ref 0–44)
AST: 34 U/L (ref 15–41)
Albumin: 3.8 g/dL (ref 3.5–5.0)
Alkaline Phosphatase: 58 U/L (ref 38–126)
Anion gap: 9 (ref 5–15)
BUN: 18 mg/dL (ref 8–23)
CO2: 26 mmol/L (ref 22–32)
Calcium: 8.9 mg/dL (ref 8.9–10.3)
Chloride: 101 mmol/L (ref 98–111)
Creatinine, Ser: 0.95 mg/dL (ref 0.44–1.00)
GFR calc Af Amer: >60 mL/min (ref >60)
GFR calc non Af Amer: >60 mL/min (ref >60)
Glucose, Bld: 173 mg/dL — ABNORMAL HIGH (ref 70–99)
Potassium: 4.7 mmol/L (ref 3.5–5.1)
Sodium: 136 mmol/L (ref 135–145)
Total Bilirubin: 1.1 mg/dL (ref 0.3–1.2)
Total Protein: 6.6 g/dL (ref 6.5–8.1)

## 2019-05-15 LAB — LITHIUM LEVEL: Lithium Lvl: <0.06 mmol/L — ABNORMAL LOW (ref 0.60–1.20)

## 2019-05-15 LAB — GLUCOSE, CAPILLARY
Glucose-Capillary: 130 mg/dL — ABNORMAL HIGH (ref 70–99)
Glucose-Capillary: 139 mg/dL — ABNORMAL HIGH (ref 70–99)
Glucose-Capillary: 156 mg/dL — ABNORMAL HIGH (ref 70–99)
Glucose-Capillary: 135 mg/dL — ABNORMAL HIGH (ref 70–99)

## 2019-05-15 LAB — URINE CULTURE: Culture: >=100000 — AB

## 2019-05-15 LAB — CBC
HCT: 42.3 % (ref 36.0–46.0)
Hemoglobin: 13.2 g/dL (ref 12.0–15.0)
MCH: 30.1 pg (ref 26.0–34.0)
MCHC: 31.2 g/dL (ref 30.0–36.0)
MCV: 96.4 fL (ref 80.0–100.0)
Platelets: 157 10*3/uL (ref 150–400)
RBC: 4.39 MIL/uL (ref 3.87–5.11)
RDW: 16.5 % — ABNORMAL HIGH (ref 11.5–15.5)
WBC: 7.6 10*3/uL (ref 4.0–10.5)
nRBC: 0 % (ref 0.0–0.2)

## 2019-05-15 LAB — T3, FREE: T3, Free: 2.2 pg/mL (ref 2.0–4.4)

## 2019-05-15 LAB — HIV ANTIBODY (ROUTINE TESTING W REFLEX): HIV Screen 4th Generation wRfx: NONREACTIVE

## 2019-05-15 MED ORDER — NICOTINE 7 MG/24HR TD PT24
7.0000 mg | MEDICATED_PATCH | Freq: Every day | TRANSDERMAL | Status: AC
  Administered 2019-05-15 – 2019-05-17 (×3): 7 mg via TRANSDERMAL
  Filled 2019-05-15 (×3): qty 1

## 2019-05-15 MED ORDER — SODIUM CHLORIDE 0.9 % IV SOLN
INTRAVENOUS | Status: DC | PRN
  Administered 2019-05-15: 250 mL via INTRAVENOUS

## 2019-05-15 MED ORDER — LEVOTHYROXINE SODIUM 100 MCG PO TABS
200.0000 ug | ORAL_TABLET | Freq: Every day | ORAL | Status: DC
  Administered 2019-05-16 – 2019-05-22 (×7): 200 ug via ORAL
  Filled 2019-05-15 (×7): qty 2

## 2019-05-15 NOTE — Progress Notes (Addendum)
PROGRESS NOTE  TELIAH BUFFALO HEN:[id number] DOB: 1950/07/11 DOA: 05/13/2019 PCP: Patient, No Pcp Per  Brief History   Darica Goren  is a 68 y.o. female, w hypertension, hyperlipidemia, Dm2, Hypothyroidism, Bipolar do , apparently presents with 3 falls in the past 24 hours,  Per ED has AMS, somnolence.  Not safe to go home. Pt states recently started gabapentin for neuropathy.  Consultants  None  Procedures  None  Antibiotics   Anti-infectives (From admission, onward)    Start     Dose/Rate Route Frequency Ordered Stop   05/15/19 0200  cefTRIAXone (ROCEPHIN) 1 g in sodium chloride 0.9 % 100 mL IVPB     1 g 200 mL/hr over 30 Minutes Intravenous Every 24 hours 05/14/19 0655     05/14/19 0230  cefTRIAXone (ROCEPHIN) 1 g in sodium chloride 0.9 % 100 mL IVPB     1 g 200 mL/hr over 30 Minutes Intravenous  Once 05/14/19 0229 05/14/19 0525        Subjective  The patient is sitting up in as chair at bedside. No new complaints.  Objective   Vitals:  Vitals:   05/15/19 0421 05/15/19 1406  BP: (!) 146/64 (!) 143/61  Pulse: 73 73  Resp: 16 19  Temp: 99 F (37.2 C) 98.2 F (36.8 C)  SpO2: 94% 96%   Exam:  Constitutional:  The patient is awake, alert, and oriented x 3. No acute distress. Respiratory:  No increased work of breathing. No wheezes, rales, or rhonchi No tactile fremitus Cardiovascular:  Regular rate and rhythm No murmurs, ectopy, or gallups. No lateral PMI. No thrills. Abdomen:  Abdomen is soft, non-tender, non-distended No hernias, masses, or organomegaly Normoactive bowel sounds.  Musculoskeletal:  No cyanosis, clubbing, or edema Skin:  No rashes, lesions, ulcers palpation of skin: no induration or nodules Neurologic:  CN 2-12 intact Sensation all 4 extremities intact I have personally reviewed the following:   Today's Data  Vitals, CBC, CMP  Micro Data  Urine culture: E. Coli  Scheduled Meds:  aspirin  81 mg Oral Daily   citalopram  20  mg Oral Daily   enoxaparin (LOVENOX) injection  60 mg Subcutaneous Q24H   fenofibrate  160 mg Oral Q breakfast   insulin aspart  0-5 Units Subcutaneous QHS   insulin aspart  0-9 Units Subcutaneous TID WC   [START ON 05/16/2019] levothyroxine  200 mcg Oral Q0600   loratadine  10 mg Oral Daily   nicotine  7 mg Transdermal Daily   Oxcarbazepine  300 mg Oral BID   risperiDONE  6 mg Oral QHS   rosuvastatin  40 mg Oral q1800   Continuous Infusions:  sodium chloride Stopped (05/15/19 0539)   cefTRIAXone (ROCEPHIN)  IV 1 g (05/15/19 0349)    Principal Problem:   AMS (altered mental status) Active Problems:   HLD (hyperlipidemia)   Diabetes type 2, controlled (HCC)   Hypothyroidism   Bipolar 1 disorder, mixed, severe ([place])   Acute lower UTI   LOS: 1 day    A & P  TIR:[id number] to Acute lower UTI, abnormal TSH, benzos   Acute lower uti:  Urine culture. Rocephin 1gm iv qday   Gait instability:  Gabapentin vs uti PT to evaluate and tx  Cardiogenic shock: Resolved. Present on admission.   Hyponatremia: hydratre with ns iv Check cmp in am   Hypothyroidism: Increase Synthroid to 225 micrograms po qday   Hyperlipidemia Cont Fenofibrate 160mg  po qday Cont Crestor 40mg  po qday  DM II Cont Metformin  No Gabapentin for now Fsbs ac and qhs, ISS   Bipolar disorder: Cont Risperdal  Cont Trileptal Cont Celexa No Gabapentin for now Lithium level is pending.  Severe Morbid obesity: Complicaates all cares. BMI 52.26. Recommendation is for sensible weight loss plan as supervised by the patient's PCP.   DVT Prophylaxis-   Lovenox - SCDs   Family Communication: Admission, patients condition and plan of care including tests being ordered have been discussed with the patient  who indicate understanding and agree with the plan and Code Status. Code Status:  FULL CODE per patient, notified sister that patient admitted to [facility]   Myrtie Leuthold, DO [facility] Direct contact:  see [email or web address]  7PM-7AM contact night coverage as above 05/15/2019, 8:07 PM  LOS: 1 day

## 2019-05-15 NOTE — Evaluation (Signed)
Occupational Therapy Evaluation Patient Details Name: Julie Morrow MRN: [id number] DOB: [birth year] Today's Date: 05/15/2019    History of Present Illness Julie Morrow  is a 68 y.o. female, w hypertension, hyperlipidemia, Dm2, Hypothyroidism, Bipolar do , apparently presents with 3 falls in the past 24 hours,  Per ED has AMS, somnolence.  Not safe to go home. Pt states recently started gabapentin, question if for neuropathy.   Clinical Impression   Pt was admitted for the above.  Pt is unsure why she fell. She normally cares for herself and she and her sister provide care for her father.  She does not wear socks at baseline. Performed ADL from chair and then breakfast arrived. Did not mobilize besides standing. Will follow in acute setting with supervision level goals.  Pt needs min guard to stand and up to mod A for LB adls    Follow Up Recommendations  Home health OT;Supervision/Assistance - 24 hour(pt declines snf per PT notes)    Equipment Recommendations  (tba further)    Recommendations for Other Services       Precautions / Restrictions Precautions Precautions: Fall Precaution Comments: pt reports 3 falls in last 24 hours Restrictions Weight Bearing Restrictions: No      Mobility Bed Mobility               General bed mobility comments: oob  Transfers   Equipment used: Rolling walker (2 wheeled)   Sit to Stand: Min guard         General transfer comment: for safety    Balance                                           ADL either performed or assessed with clinical judgement   ADL Overall ADL's : Needs assistance/impaired Eating/Feeding: Independent   Grooming: Set up   Upper Body Bathing: Set up   Lower Body Bathing: Minimal assistance;Moderate assistance;Sit to/from stand   Upper Body Dressing : Minimal assistance(lines)   Lower Body Dressing: Moderate assistance;Sit to/from stand(no socks)                  General ADL Comments: performed ADL from chair.       Vision         Perception     Praxis      Pertinent Vitals/Pain Pain Assessment: Faces Faces Pain Scale: No hurt     Hand Dominance Right   Extremity/Trunk Assessment Upper Extremity Assessment Upper Extremity Assessment: Generalized weakness           Communication Communication Communication: No difficulties   Cognition Arousal/Alertness: Awake/alert Behavior During Therapy: WFL for tasks assessed/performed Overall Cognitive Status: No family/caregiver present to determine baseline cognitive functioning                                 General Comments: followed one step commands consistently   General Comments       Exercises     Shoulder Instructions      Home Living Family/patient expects to be discharged to:: Private residence Living Arrangements: Parent Available Help at Discharge: Family               Bathroom Shower/Tub: Walk-in shower;Tub/shower unit   Bathroom Toilet: Standard     Home Equipment: Environmental consultant - 2 wheels;Cane -  single point;Shower seat          Prior Functioning/Environment      ADL's / Homemaking Assistance Needed: doesn't wear socks   Comments: she and her sister assist her father with everything        OT Problem List: Decreased activity tolerance;Decreased strength;Decreased knowledge of use of DME or AE;Impaired balance (sitting and/or standing)      OT Treatment/Interventions: Self-care/ADL training;Energy conservation;DME and/or AE instruction;Patient/family education;Balance training;Therapeutic activities    OT Goals(Current goals can be found in the care plan section) Acute Rehab OT Goals Patient Stated Goal: to return home and get more independent again OT Goal Formulation: With patient Time For Goal Achievement: 05/29/19 Potential to Achieve Goals: Good ADL Goals Pt Will Perform Grooming: with supervision;standing Pt Will Transfer  to Toilet: with supervision;ambulating;regular height toilet;bedside commode(vs) Pt Will Perform Toileting - Clothing Manipulation and hygiene: with supervision;sit to/from stand Pt Will Perform Tub/Shower Transfer: Shower transfer;with supervision;shower seat;ambulating Additional ADL Goal #1: pt will complete adl at supervision level (no socks) including clothing retrieval Additional ADL Goal #2: pt will initiate rest break without cues for energy conservation  OT Frequency: Min 2X/week   Barriers to D/C:            Co-evaluation              AM-PAC OT "6 Clicks" Daily Activity     Outcome Measure Help from another person eating meals?: None Help from another person taking care of personal grooming?: A Little Help from another person toileting, which includes using toliet, bedpan, or urinal?: A Little Help from another person bathing (including washing, rinsing, drying)?: A Lot Help from another person to put on and taking off regular upper body clothing?: A Little Help from another person to put on and taking off regular lower body clothing?: A Lot 6 Click Score: 17   End of Session    Activity Tolerance: Patient tolerated treatment well Patient left: in chair;with call bell/phone within reach;with chair alarm set  OT Visit Diagnosis: Unsteadiness on feet (R26.81);Muscle weakness (generalized) (M62.81)                Time: 9702-6378 OT Time Calculation (min): 20 min Charges:  OT General Charges $OT Visit: 1 Visit OT Evaluation $OT Eval Low Complexity: 1 Low  Sandrea Boer S, OTR/L Acute Rehabilitation Services 05/15/2019  Cecilee Rosner 05/15/2019, 9:51 AM

## 2019-05-15 NOTE — Progress Notes (Signed)
Physical Therapy Treatment Patient Details Name: Julie Morrow MRN: [id number] DOB: [birth year] Today's Date: 05/15/2019    History of Present Illness Julie Morrow  is a 68 y.o. female, w hypertension, hyperlipidemia, Dm2, Hypothyroidism, Bipolar do , apparently presents with 3 falls in the past 24 hours,  Per ED has AMS, somnolence.  Not safe to go home. Pt states recently started gabapentin, question if for neuropathy.    PT Comments    Pt just got OOB to recliner via nurse.  Assisted to bathroom.  General transfer comment: 75% VC's on safety with turns, proper walker use and proper hand placement/transfer.  Also assisted to toilet.  Pt attempting to reach beyond BOS for rail and sitting prior to completing pivot.  B LE festinating/freezing. General Gait Details: VERY unsteady gait with Parkinson's like festination, freezing and quick shuffle which decreases with VC's of "take BIG steps" and "pick up your feet" but after a few feet pt reverts back with tendancy to push walker faster than her feet progress. High Fall Risk   Follow Up Recommendations  Home health PT;Supervision/Assistance - 24 hour;Supervision for mobility/OOB     Equipment Recommendations  None recommended by PT    Recommendations for Other Services       Precautions / Restrictions Precautions Precautions: Fall Precaution Comments: pt reports 3 falls just prior to admit Restrictions Weight Bearing Restrictions: No    Mobility  Bed Mobility               General bed mobility comments: OOB via RN  Transfers Overall transfer level: Needs assistance Equipment used: Rolling walker (2 wheeled)(Bariatric) Transfers: Sit to/from [employer] Sit to Stand: Min guard;Min assist Stand pivot transfers: Min assist       General transfer comment: 75% VC's on safety with turns, proper walker use and proper hand placement/transfer.  Also assisted to toilet.  Pt attempting to reach beyond BOS for  rail and sitting prior to completing pivot.  B LE festinating/freezing.  Ambulation/Gait Ambulation/Gait assistance: Min assist Gait Distance (Feet): 50 Feet(25 feet x 2)   Gait Pattern/deviations: Trunk flexed;Wide base of support;Shuffle;Decreased stride length;Step-through pattern;Decreased step length - right;Decreased step length - left;Festinating;Ataxic Gait velocity: decreased   General Gait Details: VERY unsteady gait with Parkinson's like festination, freezing and quick shuffle which decreases with VC's of "take BIG steps" and "pick up your feet" but after a few feet pt reverts back with tendancy to push walker faster than her feet progress.   Stairs             Wheelchair Mobility    Modified Rankin (Stroke Patients Only)       Balance                                            Cognition Arousal/Alertness: Awake/alert   Overall Cognitive Status: No family/caregiver present to determine baseline cognitive functioning                                 General Comments: AxO x 2 with impaited safety cognition and inconsistant recall      Exercises      General Comments        Pertinent Vitals/Pain Pain Assessment: No/denies pain    Home Living  Prior Function            PT Goals (current goals can now be found in the care plan section) Progress towards PT goals: Progressing toward goals    Frequency    Min 3X/week      PT Plan Current plan remains appropriate    Co-evaluation              AM-PAC PT "6 Clicks" Mobility   Outcome Measure  Help needed turning from your back to your side while in a flat bed without using bedrails?: A Little Help needed moving from lying on your back to sitting on the side of a flat bed without using bedrails?: A Little Help needed moving to and from a bed to a chair (including a wheelchair)?: A Little Help needed standing up from a chair  using your arms (e.g., wheelchair or bedside chair)?: A Little Help needed to walk in hospital room?: A Little Help needed climbing 3-5 steps with a railing? : A Lot 6 Click Score: 17    End of Session Equipment Utilized During Treatment: Gait belt Activity Tolerance: Patient limited by fatigue;Other (comment)(gait instability)   Nurse Communication: Mobility status PT Visit Diagnosis: Muscle weakness (generalized) (M62.81);Unsteadiness on feet (R26.81);Difficulty in walking, not elsewhere classified (R26.2);History of falling (Z91.81)     Time: 1535-1600 PT Time Calculation (min) (ACUTE ONLY): 25 min  Charges:  $Gait Training: 8-22 mins $Therapeutic Exercise: 8-22 mins                     [name]  PTA Acute  Rehabilitation Services Pager      [phone] Office      [phone]

## 2019-05-16 LAB — BASIC METABOLIC PANEL
Anion gap: 11 (ref 5–15)
BUN: 16 mg/dL (ref 8–23)
CO2: 23 mmol/L (ref 22–32)
Calcium: 8.9 mg/dL (ref 8.9–10.3)
Chloride: 99 mmol/L (ref 98–111)
Creatinine, Ser: 1.06 mg/dL — ABNORMAL HIGH (ref 0.44–1.00)
GFR calc Af Amer: >60 mL/min (ref >60)
GFR calc non Af Amer: 54 mL/min — ABNORMAL LOW (ref >60)
Glucose, Bld: 140 mg/dL — ABNORMAL HIGH (ref 70–99)
Potassium: 4.5 mmol/L (ref 3.5–5.1)
Sodium: 133 mmol/L — ABNORMAL LOW (ref 135–145)

## 2019-05-16 LAB — CBC
HCT: 41.9 % (ref 36.0–46.0)
Hemoglobin: 13.6 g/dL (ref 12.0–15.0)
MCH: 30.2 pg (ref 26.0–34.0)
MCHC: 32.5 g/dL (ref 30.0–36.0)
MCV: 93.1 fL (ref 80.0–100.0)
Platelets: 149 10*3/uL — ABNORMAL LOW (ref 150–400)
RBC: 4.5 MIL/uL (ref 3.87–5.11)
RDW: 15.9 % — ABNORMAL HIGH (ref 11.5–15.5)
WBC: 8.3 10*3/uL (ref 4.0–10.5)
nRBC: 0 % (ref 0.0–0.2)

## 2019-05-16 LAB — GLUCOSE, CAPILLARY
Glucose-Capillary: 146 mg/dL — ABNORMAL HIGH (ref 70–99)
Glucose-Capillary: 187 mg/dL — ABNORMAL HIGH (ref 70–99)
Glucose-Capillary: 166 mg/dL — ABNORMAL HIGH (ref 70–99)
Glucose-Capillary: 141 mg/dL — ABNORMAL HIGH (ref 70–99)

## 2019-05-16 NOTE — TOC Initial Note (Signed)
Transition of Care [facility]) - Initial/Assessment Note    Patient Details  Name: Julie Morrow MRN: [id number] Date of Birth: 04/19/1951  [facility] CM/SW Contact:    Lia Hopping, [place] Phone Number: 05/16/2019, 8:56 AM  Clinical Narrative:                 CSW met with the patient at bedside to discuss Home Health vs. SNF. Patient declines SNF placement and prefers to go home. Patient reports she live in the home with father and her sister visits at times. She reports she uses a rolling walker. Patient is independent with ADL's and still drives. CSW provided the patient with Home Health choice. Patient agreeable to [facility].  Patient informed CSW that her insurance will change to [facility] on the January 1st. CSW notified the agency Rep. Georgina Snell.   Expected Discharge Plan: [place] Barriers to Discharge: No Barriers Identified   Patient Goals and CMS Choice   CMS Medicare.gov Compare Post Acute Care list provided to:: Patient Choice offered to / list presented to : Patient  Expected Discharge Plan and Services Expected Discharge Plan: [place] In-house Referral: Clinical Social Work Discharge Planning Services: CM Consult Post Acute Care Choice: [place] arrangements for the past 2 months: Single Family Home                           HH Arranged: PT, OT HH Agency: [place] Date [facility] Agency Contacted: 05/16/19 Time HH Agency Contacted: 1600 Representative spoke with at [place]: Georgina Snell  Prior Living Arrangements/Services Living arrangements for the past 2 months: [place] with:: Self Patient language and need for interpreter reviewed:: Yes Do you feel safe going back to the place where you live?: Yes      Need for Family Participation in Patient Care: Yes (Comment) Care giver support system in place?: Yes (comment) Current home services: Home OT, Home  PT Criminal Activity/Legal Involvement Pertinent to Current Situation/Hospitalization: No - Comment as needed  Activities of Daily Living Home Assistive Devices/Equipment: CBG Meter ADL Screening (condition at time of admission) Patient's cognitive ability adequate to safely complete daily activities?: Yes Is the patient deaf or have difficulty hearing?: No Does the patient have difficulty seeing, even when wearing glasses/contacts?: No Does the patient have difficulty concentrating, remembering, or making decisions?: No Patient able to express need for assistance with ADLs?: Yes Does the patient have difficulty dressing or bathing?: Yes(secondary to weakness) Independently performs ADLs?: No Communication: Independent Dressing (OT): Needs assistance Is this a change from baseline?: Pre-admission baseline Grooming: Needs assistance Is this a change from baseline?: Pre-admission baseline Feeding: Independent Bathing: Needs assistance Is this a change from baseline?: Pre-admission baseline Toileting: Needs assistance Is this a change from baseline?: Pre-admission baseline In/Out Bed: Needs assistance Is this a change from baseline?: Pre-admission baseline Walks in Home: Needs assistance Is this a change from baseline?: Pre-admission baseline Does the patient have difficulty walking or climbing stairs?: Yes(secondary to weakness and neuropathy) Weakness of Legs: Both(has peripheral neuropathy) Weakness of Arms/Hands: None  Permission Sought/Granted Permission sought to share information with : Case Manager                Emotional Assessment Appearance:: Appears stated age   Affect (typically observed): Accepting, Pleasant Orientation: : Oriented to Self, Oriented to Place, Oriented to  Time, Oriented  to Situation Alcohol / Substance Use: Not Applicable Psych Involvement: No (comment)  Admission diagnosis:  Somnolence [R40.0] Acute urinary tract infection [N39.0] AMS  (altered mental status) [R41.82] Patient Active Problem List   Diagnosis Date Noted  . AMS (altered mental status) 05/14/2019  . Acute lower UTI 05/14/2019  . Adjustment disorder with mixed disturbance of emotions and conduct   . Bipolar I disorder with mania ([place]) 12/27/2018  . PAD (peripheral artery disease) ([place]) 10/16/2018  . GAD (generalized anxiety disorder) 03/04/2018  . Insomnia 03/04/2018  . Situational depression 03/04/2018  . Bipolar disorder ([place]) 03/04/2018  . Acute kidney injury ([place]) 05/30/2017  . Bradycardia   . Lithium toxicity   . COPD GOLD 0 / still smoking  12/22/2016  . Generalized weakness 10/27/2015  . GERD (gastroesophageal reflux disease) 10/15/2015  . Bipolar 1 disorder, mixed, severe ([place]) 10/11/2015  . Diabetes type 2, controlled ([place]) 07/07/2015  . Morbid (severe) obesity due to excess calories ([place]) 07/07/2015  . Cigarette smoker 07/07/2015  . Hypothyroidism 07/07/2015  . HLD (hyperlipidemia) 11/07/2014  . Psoriasis 11/07/2014   PCP:  Patient, No Pcp Per Pharmacy:   [id number] [address], [place] [address] Phone: [phone] Fax: [phone]     Social Determinants of Health (SDOH) Interventions    Readmission Risk Interventions No flowsheet data found.

## 2019-05-16 NOTE — Progress Notes (Signed)
PROGRESS NOTE  Julie Morrow:[id number] DOB: [birth year] DOA: 05/13/2019 PCP: Patient, No Pcp Per  Brief History   Julie Morrow  is a 68 y.o. female, w hypertension, hyperlipidemia, Dm2, Hypothyroidism, Bipolar do , apparently presents with 3 falls in the past 24 hours,  Per ED has AMS, somnolence.  Not safe to go home. Pt states recently started gabapentin for neuropathy.  Pt is not safe for discharge for home per physical therapy. Family states that they are not able to care for both the patient in her unstable state and their father who had an intracranial bleed last May. Pt is currently declining SNF. I have discussed this with family. They will talk to the patient.  Consultants  . None  Procedures  None  Antibiotics   Anti-infectives (From admission, onward)   Start     Dose/Rate Route Frequency Ordered Stop   05/15/19 0200  cefTRIAXone (ROCEPHIN) 1 g in sodium chloride 0.9 % 100 mL IVPB     1 g 200 mL/hr over 30 Minutes Intravenous Every 24 hours 05/14/19 0655     05/14/19 0230  cefTRIAXone (ROCEPHIN) 1 g in sodium chloride 0.9 % 100 mL IVPB     1 g 200 mL/hr over 30 Minutes Intravenous  Once 05/14/19 0229 05/14/19 0525     .   Subjective  The patient is sitting up in as chair at bedside. No new complaints.  Objective   Vitals:  Vitals:   05/16/19 0414 05/16/19 1505  BP: (!) 143/70 122/65  Pulse: 76 75  Resp: 16 18  Temp: 98.5 F (36.9 C) 98.6 F (37 C)  SpO2: 92% 95%   Exam:  Constitutional:  . The patient is awake, alert, and oriented x 3. No acute distress. Respiratory:  . No increased work of breathing. . No wheezes, rales, or rhonchi . No tactile fremitus Cardiovascular:  . Regular rate and rhythm . No murmurs, ectopy, or gallups. . No lateral PMI. No thrills. Abdomen:  . Abdomen is soft, non-tender, non-distended . No hernias, masses, or organomegaly . Normoactive bowel sounds.  Musculoskeletal:  . No cyanosis, clubbing, or edema Skin:   . No rashes, lesions, ulcers . palpation of skin: no induration or nodules Neurologic:  . CN 2-12 intact . Sensation all 4 extremities intact I have personally reviewed the following:   Today's Data  . Vitals, CBC, CMP  Micro Data  . Urine culture: E. Coli  Scheduled Meds: . aspirin  81 mg Oral Daily  . citalopram  20 mg Oral Daily  . enoxaparin (LOVENOX) injection  60 mg Subcutaneous Q24H  . fenofibrate  160 mg Oral Q breakfast  . insulin aspart  0-5 Units Subcutaneous QHS  . insulin aspart  0-9 Units Subcutaneous TID WC  . levothyroxine  200 mcg Oral Q0600  . loratadine  10 mg Oral Daily  . nicotine  7 mg Transdermal Daily  . Oxcarbazepine  300 mg Oral BID  . risperiDONE  6 mg Oral QHS  . rosuvastatin  40 mg Oral q1800   Continuous Infusions: . sodium chloride Stopped (05/16/19 0305)  . cefTRIAXone (ROCEPHIN)  IV Stopped (05/16/19 0135)    Principal Problem:   AMS (altered mental status) Active Problems:   HLD (hyperlipidemia)   Diabetes type 2, controlled (HCC)   Hypothyroidism   Bipolar 1 disorder, mixed, severe ([place])   Acute lower UTI   LOS: 2 days    A & P  BDZ:[id number] to Acute lower UTI, benzos  Acute lower uti:  Urine culture. Rocephin 1gm iv qday. She has completed 3 days of IV antibiotics. Change to PO.    Gait instability:  Gabapentin vs uti vs parkinson's. Pt unsafe to ambulate even with walker. Family not able to provide 24/7 support due to burden of care for their father. Not a safe discharge.   Hyponatremia: Resolved with IV fluids. Monitor.  Hypothyroidism: Continue synthroid as at home. The patient has euthyroid sick syndrome. She should have her TSH rechecked 6 weeks after discharge.   Hyperlipidemia. Continue fenofibrate and crestor as at home.   DM II: Glucoses are followed by FSBS and SSI qac an dhs. Cont Metformin.  Bipolar disorder: Continue Risperdal, trileptal, and celexa. Lithium level is low.   Peripheral neuropathy: Pt on  gapapentin at home. It has been held.   Severe Morbid obesity: Complicaates all cares. BMI 52.26. Recommendation is for sensible weight loss plan as supervised by the patient's PCP.  I have seen and examined this patient myself. I have spent 44 minutes in her evaluation and care. More than 50% of this was spent in counseling with the patient's sister.   DVT Prophylaxis-   Lovenox - SCDs   Family Communication: Admission, patients condition and plan of care including tests being ordered have been discussed with the patient  who indicate understanding and agree with the plan and Code Status. Code Status:  FULL CODE per patient, notified sister that patient admitted to [facility]  Zahria Ding, DO [facility] Direct contact: see [email or web address]  7PM-7AM contact night coverage as above 05/16/2019, 6:21 PM  LOS: 1 day

## 2019-05-16 NOTE — Progress Notes (Signed)
Physical Therapy Treatment Patient Details Name: Julie Morrow MRN: [id number] DOB: 1951/03/04 Today's Date: 05/16/2019    History of Present Illness Julie Morrow  is a 68 y.o. female, w hypertension, hyperlipidemia, Dm2, Hypothyroidism, Bipolar do , apparently presents with 3 falls in the past 24 hours,  Per ED has AMS, somnolence.  Not safe to go home. Pt states recently started gabapentin, question if for neuropathy.    PT Comments    Pt in bed incont of urine.  Assisted OOB to amb to bathroom.  General bed mobility comments: supine to sit is difficult for her due to her ABD girth and short stature.  Pt tends to use momentum. General transfer comment: 50% VC's on safety with turns and targeting toilet/recliner as pt tends to let go of walker and reach for chair prior to completing turn.General Gait Details: slight improvement from yesterday but still present with Parkinson's like gait of ataxia, freezing at doorways, festination with turns and shuffled steps.  Pt is able to self correct with VC's "big steps" but eventually reverts back.  HIGH FALL RISK  Follow Up Recommendations  Home health PT;Supervision/Assistance - 24 hour;Supervision for mobility/OOB     Equipment Recommendations  None recommended by PT    Recommendations for Other Services       Precautions / Restrictions Precautions Precautions: Fall Precaution Comments: pt reports 3 falls just prior to admit Restrictions Weight Bearing Restrictions: No    Mobility  Bed Mobility Overal bed mobility: Needs Assistance Bed Mobility: Supine to Sit           General bed mobility comments: supine to sit is difficult for her due to her ABD girth and short stature.  Pt tends to use momentum.  Transfers Overall transfer level: Needs assistance Equipment used: Rolling walker (2 wheeled) Transfers: Sit to/from [employer] Sit to Stand: Min guard;Min assist Stand pivot transfers: Min assist        General transfer comment: 50% VC's on safety with turns and targeting toilet/recliner as pt tends to let go of walker and reach for chair prior to completing turn.  Ambulation/Gait Ambulation/Gait assistance: Supervision;Min guard Gait Distance (Feet): 62 Feet(20', 18', 24') Assistive device: Rolling walker (2 wheeled) Gait Pattern/deviations: Trunk flexed;Wide base of support;Shuffle;Decreased stride length;Step-through pattern;Decreased step length - right;Decreased step length - left;Festinating;Ataxic Gait velocity: decreased   General Gait Details: slight improvement from yesterday but still present with Parkinson's like gait of ataxia, freezing at doorways, festination with turns and shuffled steps.  Pt is able to self correct with VC's "big steps" but eventually reverts back.  HIGH FALL RISK   Stairs             Wheelchair Mobility    Modified Rankin (Stroke Patients Only)       Balance                                            Cognition   Behavior During Therapy: WFL for tasks assessed/performed Overall Cognitive Status: No family/caregiver present to determine baseline cognitive functioning                                 General Comments: AxO x 2 with impaited safety cognition and inconsistant recall      Exercises  General Comments        Pertinent Vitals/Pain Pain Assessment: No/denies pain    Home Living                      Prior Function            PT Goals (current goals can now be found in the care plan section) Progress towards PT goals: Progressing toward goals    Frequency    Min 3X/week      PT Plan Current plan remains appropriate    Co-evaluation              AM-PAC PT "6 Clicks" Mobility   Outcome Measure  Help needed turning from your back to your side while in a flat bed without using bedrails?: A Little Help needed moving from lying on your back to sitting on the  side of a flat bed without using bedrails?: A Little Help needed moving to and from a bed to a chair (including a wheelchair)?: A Little Help needed standing up from a chair using your arms (e.g., wheelchair or bedside chair)?: A Little Help needed to walk in hospital room?: A Little Help needed climbing 3-5 steps with a railing? : A Lot 6 Click Score: 17    End of Session Equipment Utilized During Treatment: Gait belt Activity Tolerance: Patient tolerated treatment well Patient left: in chair;with chair alarm set Nurse Communication: Mobility status PT Visit Diagnosis: Muscle weakness (generalized) (M62.81);Unsteadiness on feet (R26.81);Difficulty in walking, not elsewhere classified (R26.2);History of falling (Z91.81)     Time: 0923-3007 PT Time Calculation (min) (ACUTE ONLY): 32 min  Charges:  $Gait Training: 8-22 mins $Therapeutic Activity: 8-22 mins                     Felecia Shelling  PTA Acute  Rehabilitation Services Pager      [phone] Office      [phone]

## 2019-05-17 LAB — GLUCOSE, CAPILLARY
Glucose-Capillary: 150 mg/dL — ABNORMAL HIGH (ref 70–99)
Glucose-Capillary: 126 mg/dL — ABNORMAL HIGH (ref 70–99)
Glucose-Capillary: 124 mg/dL — ABNORMAL HIGH (ref 70–99)
Glucose-Capillary: 202 mg/dL — ABNORMAL HIGH (ref 70–99)

## 2019-05-17 MED ORDER — RISPERIDONE 2 MG PO TABS
6.0000 mg | ORAL_TABLET | Freq: Every day | ORAL | Status: DC
  Administered 2019-05-17 – 2019-05-21 (×5): 6 mg via ORAL
  Filled 2019-05-17 (×6): qty 3

## 2019-05-17 MED ORDER — CEFDINIR 300 MG PO CAPS
300.0000 mg | ORAL_CAPSULE | Freq: Two times a day (BID) | ORAL | Status: AC
  Administered 2019-05-17 – 2019-05-20 (×6): 300 mg via ORAL
  Filled 2019-05-17 (×6): qty 1

## 2019-05-17 NOTE — NC FL2 (Signed)
[place] LEVEL OF CARE SCREENING TOOL     IDENTIFICATION  Patient Name: Julie Morrow Birthdate: [birth year] Sex: female Admission Date (Current Location): 05/13/2019  [facility] and [place] Number:  Herbalist and Address:  [facility],  [place] [address] [place]      Provider Number: [id number]  Attending Physician Name and Address:  Hosie Poisson, MD  Relative Name and Phone Number:       Current Level of Care: Hospital Recommended Level of Care: [place] Prior Approval Number:    Date Approved/Denied:   PASRR Number: Pending  Discharge Plan: SNF    Current Diagnoses: Patient Active Problem List   Diagnosis Date Noted  . AMS (altered mental status) 05/14/2019  . Acute lower UTI 05/14/2019  . Adjustment disorder with mixed disturbance of emotions and conduct   . Bipolar I disorder with mania ([place]) 12/27/2018  . PAD (peripheral artery disease) ([place]) 10/16/2018  . GAD (generalized anxiety disorder) 03/04/2018  . Insomnia 03/04/2018  . Situational depression 03/04/2018  . Bipolar disorder ([place]) 03/04/2018  . Acute kidney injury ([place]) 05/30/2017  . Bradycardia   . Lithium toxicity   . COPD GOLD 0 / still smoking  12/22/2016  . Generalized weakness 10/27/2015  . GERD (gastroesophageal reflux disease) 10/15/2015  . Bipolar 1 disorder, mixed, severe ([place]) 10/11/2015  . Diabetes type 2, controlled ([place]) 07/07/2015  . Morbid (severe) obesity due to excess calories ([place]) 07/07/2015  . Cigarette smoker 07/07/2015  . Hypothyroidism 07/07/2015  . HLD (hyperlipidemia) 11/07/2014  . Psoriasis 11/07/2014    Orientation RESPIRATION BLADDER Height & Weight     Self, Place, Time, Situation  Normal Continent Weight: 297 lb (134.7 kg) Height:  5\' 3"  (160 cm)  BEHAVIORAL SYMPTOMS/MOOD NEUROLOGICAL BOWEL NUTRITION STATUS      Continent Diet(Heart Healthy Carb Modified.)  AMBULATORY STATUS COMMUNICATION OF NEEDS  Skin   Extensive Assist Verbally Normal                       Personal Care Assistance Level of Assistance  Bathing, Dressing, Feeding Bathing Assistance: Limited assistance Feeding assistance: Independent Dressing Assistance: Maximum assistance     Functional Limitations Info  Sight, Speech, Hearing Sight Info: Adequate Hearing Info: Adequate Speech Info: Adequate    SPECIAL CARE FACTORS FREQUENCY  PT (By licensed PT), OT (By licensed OT)     PT Frequency: 5x/week OT Frequency: 5x/week            Contractures Contractures Info: Not present    Additional Factors Info  Code Status, Allergies, Psychotropic, Insulin Sliding Scale Code Status Info: Fullcode Allergies Info: Allergies: No Known Allergies Psychotropic Info: Celexa, Risperdal, Trileptal Insulin Sliding Scale Info: See medication list       Current Medications (05/17/2019):  This is the current hospital active medication list Current Facility-Administered Medications  Medication Dose Route Frequency Provider Last Rate Last Admin  . 0.9 %  sodium chloride infusion   Intravenous PRN Caren Griffins, MD   Stopped at 05/16/19 0305  . acetaminophen (TYLENOL) tablet 650 mg  650 mg Oral Q6H PRN Jani Gravel, MD       Or  . acetaminophen (TYLENOL) suppository 650 mg  650 mg Rectal Q6H PRN Jani Gravel, MD      . aspirin chewable tablet 81 mg  81 mg Oral Daily Jani Gravel, MD   81 mg at 05/17/19 1036  . cefdinir (OMNICEF) capsule 300  mg  300 mg Oral Q12H Kathlen Mody, MD      . citalopram (CELEXA) tablet 20 mg  20 mg Oral Daily Pearson Grippe, MD   20 mg at 05/17/19 1036  . enoxaparin (LOVENOX) injection 60 mg  60 mg Subcutaneous Q24H Pearson Grippe, MD   60 mg at 05/17/19 1037  . fenofibrate tablet 160 mg  160 mg Oral Q breakfast Pearson Grippe, MD   160 mg at 05/17/19 8295  . insulin aspart (novoLOG) injection 0-5 Units  0-5 Units Subcutaneous QHS Pearson Grippe, MD   Stopped at 05/14/19 2157  . insulin aspart (novoLOG)  injection 0-9 Units  0-9 Units Subcutaneous TID WC Pearson Grippe, MD   1 Units at 05/17/19 1252  . levothyroxine (SYNTHROID) tablet 200 mcg  200 mcg Oral Q0600 Swayze, Ava, DO   200 mcg at 05/17/19 0649  . loratadine (CLARITIN) tablet 10 mg  10 mg Oral Daily Pearson Grippe, MD   10 mg at 05/17/19 1036  . nicotine (NICODERM CQ - dosed in mg/24 hr) patch 7 mg  7 mg Transdermal Daily Marikay Alar, FNP   7 mg at 05/17/19 1037  . Oxcarbazepine (TRILEPTAL) tablet 300 mg  300 mg Oral BID Pearson Grippe, MD   300 mg at 05/17/19 6213  . risperiDONE (RISPERDAL) tablet 6 mg  6 mg Oral QHS Pearson Grippe, MD   6 mg at 05/16/19 2316  . rosuvastatin (CRESTOR) tablet 40 mg  40 mg Oral q1800 Pearson Grippe, MD   40 mg at 05/16/19 1722     Discharge Medications: Please see discharge summary for a list of discharge medications.  Relevant Imaging Results:  Relevant Lab Results:   Additional Information SSN:  [id number]  Clearance Coots, LCSW

## 2019-05-17 NOTE — Progress Notes (Signed)
Occupational Therapy Treatment Patient Details Name: Julie Morrow MRN: [id number] DOB: [birth year] Today's Date: 05/17/2019    History of present illness Julie Morrow  is a 68 y.o. female, w hypertension, hyperlipidemia, Dm2, Hypothyroidism, Bipolar do , apparently presents with 3 falls in the past 24 hours,  Per ED has AMS, somnolence.    OT comments  Asked to check chair alarm as RN reports that pt has gotten up from chair twice and chair alarm hadn't gone off. When I stepped into room, floor was wet, chair was saturated.  Assisted pt with cleaning up and back to bed.  She will need snf for rehab prior to home  Follow Up Recommendations  SNF   Equipment Recommendations  3 in 1 bedside commode(may need wide)    Recommendations for Other Services      Precautions / Restrictions Precautions Precautions: Fall Precaution Comments: pt reports 3 falls just prior to admit Restrictions Weight Bearing Restrictions: No       Mobility Bed Mobility         Supine to sit: Mod assist;HOB elevated Sit to supine: Mod assist   General bed mobility comments: for legs and to straighten up in bed  Transfers   Equipment used: Rolling walker (2 wheeled)   Sit to Stand: Min assist Stand pivot transfers: Min assist       General transfer comment: cues for hand placement and to turn completely. Pt with decreased safety    Balance                                           ADL either performed or assessed with clinical judgement   ADL                       Toilet Transfer: Minimal assistance;Stand-pivot;RW (bed)   Toileting- Clothing Manipulation and Hygiene: Total assistance              Vision       Perception     Praxis      Cognition Arousal/Alertness: Awake/alert Behavior During Therapy: WFL for tasks assessed/performed Overall Cognitive Status: No family/caregiver present to determine baseline cognitive functioning                                  General Comments: decreased safety  unaware she was saturated with urine        Exercises     Shoulder Instructions       General Comments      Pertinent Vitals/ Pain       Pain Assessment: No/denies pain  Home Living                                          Prior Functioning/Environment              Frequency  Min 2X/week        Progress Toward Goals  OT Goals(current goals can now be found in the care plan section)  Progress towards OT goals: Not progressing toward goals - comment(slow progress; more A with bed mob today)     Plan      Co-evaluation  AM-PAC OT "6 Clicks" Daily Activity     Outcome Measure   Help from another person eating meals?: None Help from another person taking care of personal grooming?: A Little Help from another person toileting, which includes using toliet, bedpan, or urinal?: A Little Help from another person bathing (including washing, rinsing, drying)?: A Lot Help from another person to put on and taking off regular upper body clothing?: A Little Help from another person to put on and taking off regular lower body clothing?: A Lot 6 Click Score: 17    End of Session    OT Visit Diagnosis: Unsteadiness on feet (R26.81)   Activity Tolerance Patient tolerated treatment well   Patient Left in chair;with call bell/phone within reach;with chair alarm set   Nurse Communication          Time: 1610-9604 OT Time Calculation (min): 19 min  Charges: OT General Charges $OT Visit: 1 Visit OT Treatments $Self Care/Home Management : 8-22 mins  Bowe Sidor S, OTR/L Acute Rehabilitation Services 05/17/2019   [place] 05/17/2019, 1:41 PM

## 2019-05-17 NOTE — TOC Progression Note (Addendum)
Transition of Care [facility]) - Progression Note    Patient Details  Name: Julie Morrow MRN: [id number] Date of Birth: 1951-03-28  Transition of Care [facility]) CM/SW [place], LCSW Phone Number: 05/17/2019, 1:45 PM  Clinical Narrative:    Patient now agreeable to SNF placement at this time.  FL2 completed.  PASRR level II, requested documents faxed.  Patient insurance will change to [facility] tomorrow, CSW notified SNF options. CSW reached out to the patient sister Otila Kluver and Casper Harrison and provided an update.    Expected Discharge Plan: Skilled Nursing Facility Barriers to Discharge: [place] (PASRR), Insurance Authorization, Other (comment)(COVID-19 test pending)  Expected Discharge Plan and Services Expected Discharge Plan: [place] In-house Referral: Clinical Social Work Discharge Planning Services: CM Consult Post Acute Care Choice: [place] arrangements for the past 2 months: Single Family Home                           HH Arranged: PT, OT HH Agency: [place] Date [place]: 05/16/19 Time HH Agency Contacted: 1600 Representative spoke with at [place]: [place] ([place]) Interventions    Readmission Risk Interventions No flowsheet data found.

## 2019-05-17 NOTE — Progress Notes (Signed)
Occupational Therapy Treatment Patient Details Name: Julie Morrow MRN: [id number] DOB: 09-11-1950 Today's Date: 05/17/2019    History of present illness Julie Morrow  is a 68 y.o. female, w hypertension, hyperlipidemia, Dm2, Hypothyroidism, Bipolar do , apparently presents with 3 falls in the past 24 hours,  Per ED has AMS, somnolence.    OT comments  Pt progressing slowly.  She needed more assistance with bed mobility, was processing slowly, requring multimodal cues and demonstrated decreased safety with SPT to chair.  Recommend SNF and expressed this to her.  Follow Up Recommendations  SNF(if home, needs 24/7 and HHOT.  Julie Morrow is caring for their dad )    Equipment Recommendations  3 in 1 bedside commode(may need wide)    Recommendations for Other Services      Precautions / Restrictions Precautions Precautions: Fall Precaution Comments: pt reports 3 falls just prior to admit Restrictions Weight Bearing Restrictions: No       Mobility Bed Mobility         Supine to sit: Mod assist;HOB elevated     General bed mobility comments: slow processing, mod A for trunk and to scoot to EOB even with use of rails  Transfers   Equipment used: Rolling walker (2 wheeled)   Sit to Stand: Min assist Stand pivot transfers: Min assist       General transfer comment: cues for hand placement and to turn completely. Pt with decreased safety    Balance                                           ADL either performed or assessed with clinical judgement   ADL       Grooming: Set up   Upper Body Bathing: Set up   Lower Body Bathing: Minimal assistance;Moderate assistance   Upper Body Dressing : Minimal assistance       Toilet Transfer: Minimal assistance;Stand-pivot;RW(chair)             General ADL Comments: performed ADL from EOB and then got up to chair to await breakfast     Vision       Perception     Praxis      Cognition  Arousal/Alertness: Awake/alert Behavior During Therapy: WFL for tasks assessed/performed Overall Cognitive Status: No family/caregiver present to determine baseline cognitive functioning                                 General Comments: Slow processing, multimodal cues, decreased safety awareness with transfer        Exercises     Shoulder Instructions       General Comments      Pertinent Vitals/ Pain       Pain Assessment: No/denies pain  Home Living                                          Prior Functioning/Environment              Frequency  Min 2X/week        Progress Toward Goals  OT Goals(current goals can now be found in the care plan section)  Progress towards OT goals: Not progressing toward goals - comment(slow  progress; more A with bed mob today)     Plan      Co-evaluation                 AM-PAC OT "6 Clicks" Daily Activity     Outcome Measure   Help from another person eating meals?: None Help from another person taking care of personal grooming?: A Little Help from another person toileting, which includes using toliet, bedpan, or urinal?: A Little Help from another person bathing (including washing, rinsing, drying)?: A Lot Help from another person to put on and taking off regular upper body clothing?: A Little Help from another person to put on and taking off regular lower body clothing?: A Lot 6 Click Score: 17    End of Session    OT Visit Diagnosis: Unsteadiness on feet (R26.81)   Activity Tolerance Patient tolerated treatment well   Patient Left in chair;with call bell/phone within reach;with chair alarm set   Nurse Communication          Time: [phone] OT Time Calculation (min): 27 min  Charges: OT General Charges $OT Visit: 1 Visit OT Treatments $Self Care/Home Management : 23-37 mins  Rainy Rothman S, OTR/L Acute Rehabilitation Services 05/17/2019    05/17/2019, 10:30  AM

## 2019-05-17 NOTE — Progress Notes (Signed)
PROGRESS NOTE    Arther DamesSusan B Kubly  ZOX:[id number] DOB: [birth year] DOA: 05/13/2019 PCP: Patient, No Pcp Per    Brief Narrative:  68 year old female with prior h/o hypertension, hyperlipidemia, hypothyroidism, DM, bipolar disorder, presents with multiple falls. PT evaluation recommended SNF.  Pt is not safe for discharge home as family is not able to care for both the patient and their father .   Assessment & Plan:   Principal Problem:   AMS (altered mental status) Active Problems:   HLD (hyperlipidemia)   Diabetes type 2, controlled (HCC)   Hypothyroidism   Bipolar 1 disorder, mixed, severe (HCC)   Acute lower UTI  Acute metabolic and toxic encephalopathy:  Appears to have resolved.    UTI:  GROWING E coli. Sensitive to cephalosporins.  Change to omnicef to complete the course.  Afebrile and leukocytosis resolved.   Hyponatremia:  Possibly from dehydration.   Improving.    DM:  CBG (last 3)  Recent Labs    05/16/19 2143 05/17/19 0759 05/17/19 1216  GLUCAP 141* 150* 126*   Resume SSI.    Bipolar disorder:  Resume risperidal, trileptal and celexa.    Morbid obesity: Body mass index is 52.61 kg/m. Will need outpatient follow up with PCP , for bariatric surgery referral .    Hypertension:  Well controlled.     Hypothyroidism:  Resume synthroid 200 mcg daily.     H/o tobacco abuse:  Resume nicotine patch tapering dose.    Hyperlipidemia:  Resume fenofibrate and crestor.     Diabetes mellitus:  CBG (last 3)  Recent Labs    05/16/19 2143 05/17/19 0759 05/17/19 1216  GLUCAP 141* 150* 126*   Diet controlled. Last A1c in august is 6.1.    DVT prophylaxis: lovenox.  Code Status: full code.  Family Communication: none at bedside.  Disposition Plan: pending SNF placement,   Consultants:   None.   Procedures: none.  Antimicrobials:  omnicef to complete the course.  Subjective: Pt denies any  New complaints. Agreeable to SNF.    Objective: Vitals:   05/16/19 0414 05/16/19 1505 05/16/19 2140 05/17/19 0547  BP: (!) 143/70 122/65 (!) 110/91 (!) 143/78  Pulse: 76 75 78 80  Resp: 16 18 18 19   Temp: 98.5 F (36.9 C) 98.6 F (37 C) 97.7 F (36.5 C) 97.8 F (36.6 C)  TempSrc: Oral Oral Oral Oral  SpO2: 92% 95% 98% 98%  Weight:    134.7 kg  Height:        Intake/Output Summary (Last 24 hours) at 05/17/2019 1325 Last data filed at 05/17/2019 0600 Gross per 24 hour  Intake 320 ml  Output 1000 ml  Net -680 ml   Filed Weights   05/15/19 0500 05/16/19 0410 05/17/19 0547  Weight: 133.8 kg 133.8 kg 134.7 kg    Examination:  General exam: Appears calm and comfortable  Respiratory system: Clear to auscultation. Respiratory effort normal. Cardiovascular system: S1 & S2 heard, RRR.  No pedal edema. Gastrointestinal system: Abdomen is nondistended, soft and nontender. Normal bowel sounds heard. Central nervous system: Alert and oriented and able to move all extremities, no focal deficits.  Extremities: no pedal edema cyanosis or clubbing.  Skin: No rashes,  Psychiatry:  Mood & affect appropriate.     Data Reviewed: I have personally reviewed following labs and imaging studies  CBC: Recent Labs  Lab 05/14/19 0130 05/15/19 0551 05/16/19 0612  WBC 13.4* 7.6 8.3  NEUTROABS 10.1*  --   --  HGB 14.0 13.2 13.6  HCT 43.9 42.3 41.9  MCV 95.2 96.4 93.1  PLT 227 157 149*   Basic Metabolic Panel: Recent Labs  Lab 05/14/19 0130 05/15/19 0551 05/16/19 0612  NA 132* 136 133*  K 4.3 4.7 4.5  CL 94* 101 99  CO2 25 26 23   GLUCOSE 140* 173* 140*  BUN 30* 18 16  CREATININE 1.04* 0.95 1.06*  CALCIUM 9.2 8.9 8.9   GFR: Estimated Creatinine Clearance: 68.4 mL/min (A) (by C-G formula based on SCr of 1.06 mg/dL (H)). Liver Function Tests: Recent Labs  Lab 05/14/19 0130 05/15/19 0551  AST 38 34  ALT 34 27  ALKPHOS 71 58  BILITOT 0.7 1.1  PROT 7.1 6.6  ALBUMIN 4.1 3.8   No results for input(s):  LIPASE, AMYLASE in the last 168 hours. Recent Labs  Lab 05/14/19 0130  AMMONIA 14   Coagulation Profile: No results for input(s): INR, PROTIME in the last 168 hours. Cardiac Enzymes: No results for input(s): CKTOTAL, CKMB, CKMBINDEX, TROPONINI in the last 168 hours. BNP (last 3 results) No results for input(s): PROBNP in the last 8760 hours. HbA1C: No results for input(s): HGBA1C in the last 72 hours. CBG: Recent Labs  Lab 05/16/19 1131 05/16/19 1611 05/16/19 2143 05/17/19 0759 05/17/19 1216  GLUCAP 187* 166* 141* 150* 126*   Lipid Profile: No results for input(s): CHOL, HDL, LDLCALC, TRIG, CHOLHDL, LDLDIRECT in the last 72 hours. Thyroid Function Tests: No results for input(s): TSH, T4TOTAL, FREET4, T3FREE, THYROIDAB in the last 72 hours. Anemia Panel: No results for input(s): VITAMINB12, FOLATE, FERRITIN, TIBC, IRON, RETICCTPCT in the last 72 hours. Sepsis Labs: No results for input(s): PROCALCITON, LATICACIDVEN in the last 168 hours.  Recent Results (from the past 240 hour(s))  Urine culture     Status: Abnormal   Collection Time: 05/14/19  2:29 AM   Specimen: Urine, Clean Catch  Result Value Ref Range Status   Specimen Description   Final    URINE, CLEAN CATCH Performed at [facility], [address], [place], [place]    Special Requests   Final    NONE Performed at [facility], [address] [place], [place]    Culture >=100,000 COLONIES/mL ESCHERICHIA COLI (A)  Final   Report Status 05/15/2019 FINAL  Final   Organism ID, Bacteria ESCHERICHIA COLI (A)  Final      Susceptibility   Escherichia coli - MIC*    AMPICILLIN >=32 RESISTANT Resistant     CEFAZOLIN <=4 SENSITIVE Sensitive     CEFTRIAXONE <=1 SENSITIVE Sensitive     CIPROFLOXACIN >=4 RESISTANT Resistant     GENTAMICIN >=16 RESISTANT Resistant     IMIPENEM <=0.25 SENSITIVE Sensitive     NITROFURANTOIN <=16 SENSITIVE Sensitive      TRIMETH/SULFA <=20 SENSITIVE Sensitive     AMPICILLIN/SULBACTAM 16 INTERMEDIATE Intermediate     PIP/TAZO <=4 SENSITIVE Sensitive     * >=100,000 COLONIES/mL ESCHERICHIA COLI  SARS CORONAVIRUS 2 (TAT 6-24 HRS) Nasopharyngeal Nasopharyngeal Swab     Status: None   Collection Time: 05/14/19  6:27 AM   Specimen: Nasopharyngeal Swab  Result Value Ref Range Status   SARS Coronavirus 2 NEGATIVE NEGATIVE Final    Comment: (NOTE) SARS-CoV-2 target nucleic acids are NOT DETECTED. The SARS-CoV-2 RNA is generally detectable in upper and lower respiratory specimens during the acute phase of infection. Negative results do not preclude SARS-CoV-2 infection, do not rule out co-infections with other pathogens,  and should not be used as the sole basis for treatment or other patient management decisions. Negative results must be combined with clinical observations, patient history, and epidemiological information. The expected result is Negative. Fact Sheet for Patients: SugarRoll.be Fact Sheet for Healthcare Providers: https://www.woods-mathews.com/ This test is not yet approved or cleared by the [place] FDA and  has been authorized for detection and/or diagnosis of SARS-CoV-2 by FDA under an Emergency Use Authorization (EUA). This EUA will remain  in effect (meaning this test can be used) for the duration of the COVID-19 declaration under Section 56 4(b)(1) of the Act, 21 U.S.C. section 360bbb-3(b)(1), unless the authorization is terminated or revoked sooner. Performed at [facility], [place] [address], [place], [place] [address]          Radiology Studies: No results found.      Scheduled Meds: . aspirin  81 mg Oral Daily  . cefdinir  300 mg Oral Q12H  . citalopram  20 mg Oral Daily  . enoxaparin (LOVENOX) injection  60 mg Subcutaneous Q24H  . fenofibrate  160 mg Oral Q breakfast  . insulin aspart  0-5 Units Subcutaneous QHS  .  insulin aspart  0-9 Units Subcutaneous TID WC  . levothyroxine  200 mcg Oral Q0600  . loratadine  10 mg Oral Daily  . nicotine  7 mg Transdermal Daily  . Oxcarbazepine  300 mg Oral BID  . risperiDONE  6 mg Oral QHS  . rosuvastatin  40 mg Oral q1800   Continuous Infusions: . sodium chloride Stopped (05/16/19 0305)     LOS: 3 days        Hosie Poisson, MD [facility]

## 2019-05-17 NOTE — Care Management Important Message (Signed)
Important Message  Patient Details IM Letter given to Kathrin Greathouse SW to present to the Patient Name: Julie Morrow MRN: [id number] Date of Birth: 1950-11-02   Medicare Important Message Given:  Yes     Kerin Salen 05/17/2019, 10:29 AM

## 2019-05-18 LAB — GLUCOSE, CAPILLARY
Glucose-Capillary: 146 mg/dL — ABNORMAL HIGH (ref 70–99)
Glucose-Capillary: 135 mg/dL — ABNORMAL HIGH (ref 70–99)
Glucose-Capillary: 143 mg/dL — ABNORMAL HIGH (ref 70–99)
Glucose-Capillary: 112 mg/dL — ABNORMAL HIGH (ref 70–99)

## 2019-05-18 NOTE — Progress Notes (Signed)
PROGRESS NOTE    Julie Morrow  NGE:[id number] DOB: [birth year] DOA: 05/13/2019 PCP: Patient, No Pcp Per    Brief Narrative:  69 year old female with prior h/o hypertension, hyperlipidemia, hypothyroidism, DM, bipolar disorder, presents with multiple falls. PT evaluation recommended SNF.  Pt is not safe for discharge home as family is not able to care for both the patient and their father .  PT will need insurance authorization for her disposition to SNF which will happened Monday as per CSW.  Meanwhile patient did not have any complaints today, sitting in the chair and eating breakfast and a able to answer all questions and is following commands.  Assessment & Plan:   Principal Problem:   AMS (altered mental status) Active Problems:   HLD (hyperlipidemia)   Diabetes type 2, controlled (HCC)   Hypothyroidism   Bipolar 1 disorder, mixed, severe (HCC)   Acute lower UTI  Acute metabolic and toxic encephalopathy:  Peers to have resolved.  She is alert and oriented to place and person and answering all questions and following commands.   E. coli urinary tract infection On Omnicef to complete the course Afebrile and leukocytosis resolved.   Hyponatremia:  Improving.   Bipolar disorder:  Resume risperidal, trileptal and celexa.    Morbid obesity: Body mass index is 53.5 kg/m. Will need outpatient follow up with PCP , for bariatric surgery referral .    Hypertension:  Well-controlled blood pressure parameters.    Hypothyroidism:  Resume synthroid 200 mcg daily.     H/o tobacco abuse:  Counseling provided.  On nicotine patch   Hyperlipidemia:  Resume Crestor and fenofibrate    Diabetes mellitus: Hyperglycemia CBG (last 3)  Recent Labs    05/17/19 2101 05/18/19 0740 05/18/19 1134  GLUCAP 202* 146* 135*   Diet controlled. Last A1c in august is 6.1. Continue with sliding scale insulin   DVT prophylaxis: lovenox.  Code Status: full code.  Family  Communication: none at bedside.  Disposition Plan: pending SNF placement, probably on Monday  Consultants:   None.   Procedures: none.  Antimicrobials:  omnicef to complete the course.  Subjective: Patient denies any new complaints at this time.  Objective: Vitals:   05/17/19 1350 05/17/19 2059 05/18/19 0500 05/18/19 0602  BP: 136/60 (!) 142/67  (!) 151/72  Pulse: 73 66  77  Resp: (!) 24 16  20   Temp: 98.4 F (36.9 C) 98.5 F (36.9 C)  98.8 F (37.1 C)  TempSrc: Oral   Oral  SpO2: 95% 92%  93%  Weight:   (!) 137 kg   Height:        Intake/Output Summary (Last 24 hours) at 05/18/2019 1229 Last data filed at 05/17/2019 1900 Gross per 24 hour  Intake --  Output 1150 ml  Net -1150 ml   Filed Weights   05/16/19 0410 05/17/19 0547 05/18/19 0500  Weight: 133.8 kg 134.7 kg (!) 137 kg    Examination:  General exam: Alert and comfortable, sitting in the chair and eating breakfast Respiratory system: Clear to auscultation bilaterally, no wheezing or rhonchi Cardiovascular system: S1-S2 heard, regular rate rhythm, no pedal edema Gastrointestinal system: Abdomen is soft, nontender, nondistended, bowel sounds good Central nervous system: Alert oriented to place and person, answering questions and following commands. Extremities: No pedal edema Skin: No rashes Psychiatry: Mood appropriate    Data Reviewed: I have personally reviewed following labs and imaging studies  CBC: Recent Labs  Lab 05/14/19 0130 05/15/19 0551 05/16/19  0612  WBC 13.4* 7.6 8.3  NEUTROABS 10.1*  --   --   HGB 14.0 13.2 13.6  HCT 43.9 42.3 41.9  MCV 95.2 96.4 93.1  PLT 227 157 149*   Basic Metabolic Panel: Recent Labs  Lab 05/14/19 0130 05/15/19 0551 05/16/19 0612  NA 132* 136 133*  K 4.3 4.7 4.5  CL 94* 101 99  CO2 25 26 23   GLUCOSE 140* 173* 140*  BUN 30* 18 16  CREATININE 1.04* 0.95 1.06*  CALCIUM 9.2 8.9 8.9   GFR: Estimated Creatinine Clearance: 69.1 mL/min (A) (by C-G  formula based on SCr of 1.06 mg/dL (H)). Liver Function Tests: Recent Labs  Lab 05/14/19 0130 05/15/19 0551  AST 38 34  ALT 34 27  ALKPHOS 71 58  BILITOT 0.7 1.1  PROT 7.1 6.6  ALBUMIN 4.1 3.8   No results for input(s): LIPASE, AMYLASE in the last 168 hours. Recent Labs  Lab 05/14/19 0130  AMMONIA 14   Coagulation Profile: No results for input(s): INR, PROTIME in the last 168 hours. Cardiac Enzymes: No results for input(s): CKTOTAL, CKMB, CKMBINDEX, TROPONINI in the last 168 hours. BNP (last 3 results) No results for input(s): PROBNP in the last 8760 hours. HbA1C: No results for input(s): HGBA1C in the last 72 hours. CBG: Recent Labs  Lab 05/17/19 1216 05/17/19 1700 05/17/19 2101 05/18/19 0740 05/18/19 1134  GLUCAP 126* 124* 202* 146* 135*   Lipid Profile: No results for input(s): CHOL, HDL, LDLCALC, TRIG, CHOLHDL, LDLDIRECT in the last 72 hours. Thyroid Function Tests: No results for input(s): TSH, T4TOTAL, FREET4, T3FREE, THYROIDAB in the last 72 hours. Anemia Panel: No results for input(s): VITAMINB12, FOLATE, FERRITIN, TIBC, IRON, RETICCTPCT in the last 72 hours. Sepsis Labs: No results for input(s): PROCALCITON, LATICACIDVEN in the last 168 hours.  Recent Results (from the past 240 hour(s))  Urine culture     Status: Abnormal   Collection Time: 05/14/19  2:29 AM   Specimen: Urine, Clean Catch  Result Value Ref Range Status   Specimen Description   Final    URINE, CLEAN CATCH Performed at [facility], [address], [place], [place]    Special Requests   Final    NONE Performed at [facility], [address], [place], [place]    Culture >=100,000 COLONIES/mL ESCHERICHIA COLI (A)  Final   Report Status 05/15/2019 FINAL  Final   Organism ID, Bacteria ESCHERICHIA COLI (A)  Final      Susceptibility   Escherichia coli - MIC*    AMPICILLIN >=32 RESISTANT Resistant     CEFAZOLIN <=4 SENSITIVE  Sensitive     CEFTRIAXONE <=1 SENSITIVE Sensitive     CIPROFLOXACIN >=4 RESISTANT Resistant     GENTAMICIN >=16 RESISTANT Resistant     IMIPENEM <=0.25 SENSITIVE Sensitive     NITROFURANTOIN <=16 SENSITIVE Sensitive     TRIMETH/SULFA <=20 SENSITIVE Sensitive     AMPICILLIN/SULBACTAM 16 INTERMEDIATE Intermediate     PIP/TAZO <=4 SENSITIVE Sensitive     * >=100,000 COLONIES/mL ESCHERICHIA COLI  SARS CORONAVIRUS 2 (TAT 6-24 HRS) Nasopharyngeal Nasopharyngeal Swab     Status: None   Collection Time: 05/14/19  6:27 AM   Specimen: Nasopharyngeal Swab  Result Value Ref Range Status   SARS Coronavirus 2 NEGATIVE NEGATIVE Final    Comment: (NOTE) SARS-CoV-2 target nucleic acids are NOT DETECTED. The SARS-CoV-2 RNA is generally detectable in upper and lower respiratory specimens during the acute phase of  infection. Negative results do not preclude SARS-CoV-2 infection, do not rule out co-infections with other pathogens, and should not be used as the sole basis for treatment or other patient management decisions. Negative results must be combined with clinical observations, patient history, and epidemiological information. The expected result is Negative. Fact Sheet for Patients: SugarRoll.be Fact Sheet for Healthcare Providers: https://www.woods-mathews.com/ This test is not yet approved or cleared by the [place] FDA and  has been authorized for detection and/or diagnosis of SARS-CoV-2 by FDA under an Emergency Use Authorization (EUA). This EUA will remain  in effect (meaning this test can be used) for the duration of the COVID-19 declaration under Section 56 4(b)(1) of the Act, 21 U.S.C. section 360bbb-3(b)(1), unless the authorization is terminated or revoked sooner. Performed at [facility], [place] [address], [place], [place] [address]          Radiology Studies: No results found.      Scheduled Meds: . aspirin  81 mg  Oral Daily  . cefdinir  300 mg Oral Q12H  . citalopram  20 mg Oral Daily  . enoxaparin (LOVENOX) injection  60 mg Subcutaneous Q24H  . fenofibrate  160 mg Oral Q breakfast  . insulin aspart  0-5 Units Subcutaneous QHS  . insulin aspart  0-9 Units Subcutaneous TID WC  . levothyroxine  200 mcg Oral Q0600  . loratadine  10 mg Oral Daily  . Oxcarbazepine  300 mg Oral BID  . risperiDONE  6 mg Oral QHS  . rosuvastatin  40 mg Oral q1800   Continuous Infusions: . sodium chloride Stopped (05/16/19 0305)     LOS: 4 days        Hosie Poisson, MD [facility]

## 2019-05-18 NOTE — TOC Progression Note (Signed)
Transition of Care [facility]) - Progression Note    Patient Details  Name: Julie Morrow MRN: [id number] Date of Birth: 1950-12-30  Transition of Care [facility]) CM/SW Contact  Clearance Coots, LCSW Phone Number: 05/18/2019, 2:41 PM  Clinical Narrative:    PASRR received.  Unable to start SNF authorization process due to [facility] offices being closed in observance of the Holiday.   Expected Discharge Plan: Skilled Nursing Facility Barriers to Discharge: Awaiting State Approval (PASRR), Insurance Authorization, Other (comment)(COVID-19 test pending)  Expected Discharge Plan and Services Expected Discharge Plan: Skilled Nursing Facility In-house Referral: Clinical Social Work Discharge Planning Services: CM Consult Post Acute Care Choice: Home Health Living arrangements for the past 2 months: Single Family Home                           HH Arranged: PT, OT HH Agency: [facility] Date [facility] Agency Contacted: 05/16/19 Time HH Agency Contacted: 1600 Representative spoke with at [facility] Agency: Denyse Amass   Social Determinants of Health (SDOH) Interventions    Readmission Risk Interventions No flowsheet data found.

## 2019-05-18 NOTE — Progress Notes (Signed)
Physical Therapy Treatment Patient Details Name: Julie Morrow MRN: [id number] DOB: 02-21-1951 Today's Date: 05/18/2019    History of Present Illness Julie Morrow  is a 69 y.o. female, w hypertension, hyperlipidemia, Dm2, Hypothyroidism, Bipolar do , apparently presents with 3 falls in the past 24 hours,  Per ED has AMS, somnolence.     PT Comments    Pt OOB in recliner.  Assisted to bathroom then amb in hallway. General transfer comment: cues for hand placement and to turn completely. Pt with decreased safety awareness.   General Gait Details: slight improvement  but still present with Parkinson's like gait of ataxia, freezing at doorways, festination with turns and shuffled steps.  Pt is able to self correct with VC's "big steps" but eventually reverts back.  HIGH FALL RISK  Follow Up Recommendations  SNF     Equipment Recommendations  None recommended by PT    Recommendations for Other Services       Precautions / Restrictions Precautions Precautions: Fall Precaution Comments: pt reports 3 falls just prior to admit Restrictions Weight Bearing Restrictions: No    Mobility  Bed Mobility               General bed mobility comments: OOB in recliner  Transfers Overall transfer level: Needs assistance Equipment used: Rolling walker (2 wheeled) Transfers: Sit to/from [employer] Sit to Stand: Min assist Stand pivot transfers: Min assist       General transfer comment: cues for hand placement and to turn completely. Pt with decreased safety awareness  Ambulation/Gait Ambulation/Gait assistance: Supervision;Min guard Gait Distance (Feet): 75 Feet(x 4 seated rest breaks) Assistive device: Rolling walker (2 wheeled) Gait Pattern/deviations: Trunk flexed;Wide base of support;Shuffle;Decreased stride length;Step-through pattern;Decreased step length - right;Decreased step length - left;Festinating;Ataxic Gait velocity: decreased   General Gait Details:  slight improvement  but still present with Parkinson's like gait of ataxia, freezing at doorways, festination with turns and shuffled steps.  Pt is able to self correct with VC's "big steps" but eventually reverts back.  HIGH FALL RISK   Stairs             Wheelchair Mobility    Modified Rankin (Stroke Patients Only)       Balance                                            Cognition Arousal/Alertness: Awake/alert Behavior During Therapy: WFL for tasks assessed/performed                                   General Comments: decreased safety cognition      Exercises      General Comments        Pertinent Vitals/Pain Pain Assessment: No/denies pain    Home Living                      Prior Function            PT Goals (current goals can now be found in the care plan section) Progress towards PT goals: Progressing toward goals    Frequency    Min 3X/week      PT Plan Current plan remains appropriate    Co-evaluation  AM-PAC PT "6 Clicks" Mobility   Outcome Measure  Help needed turning from your back to your side while in a flat bed without using bedrails?: A Little Help needed moving from lying on your back to sitting on the side of a flat bed without using bedrails?: A Little Help needed moving to and from a bed to a chair (including a wheelchair)?: A Little Help needed standing up from a chair using your arms (e.g., wheelchair or bedside chair)?: A Little Help needed to walk in hospital room?: A Little Help needed climbing 3-5 steps with a railing? : A Lot 6 Click Score: 17    End of Session Equipment Utilized During Treatment: Gait belt Activity Tolerance: Patient tolerated treatment well Patient left: in chair;with chair alarm set Nurse Communication: Mobility status PT Visit Diagnosis: Muscle weakness (generalized) (M62.81);Unsteadiness on feet (R26.81);Difficulty in walking, not  elsewhere classified (R26.2);History of falling (Z91.81)     Time: 0160-1093 PT Time Calculation (min) (ACUTE ONLY): 28 min  Charges:  $Gait Training: 8-22 mins $Therapeutic Activity: 8-22 mins                     [name]  PTA Acute  Rehabilitation Services Pager      [phone] Office      [phone]

## 2019-05-19 LAB — GLUCOSE, CAPILLARY
Glucose-Capillary: 130 mg/dL — ABNORMAL HIGH (ref 70–99)
Glucose-Capillary: 174 mg/dL — ABNORMAL HIGH (ref 70–99)
Glucose-Capillary: 93 mg/dL (ref 70–99)
Glucose-Capillary: 159 mg/dL — ABNORMAL HIGH (ref 70–99)

## 2019-05-19 NOTE — Progress Notes (Signed)
PROGRESS NOTE    Julie Morrow  ZYS:[id number] DOB: March 11, 1951 DOA: 05/13/2019 PCP: Patient, No Pcp Per   Brief Narrative:  Patient is a 69 year old female with history of hypertension, hyperlipidemia, hypothyroidism, diabetes mellitus, bipolar disorder who presented with confusion,multiple falls from home.  Currently she is hemodynamically stable.  Physical therapy recommended skilled nursing facility.  Waiting for insurance authorization which will likely happen on Monday.  Assessment & Plan:   Principal Problem:   AMS (altered mental status) Active Problems:   HLD (hyperlipidemia)   Diabetes type 2, controlled (HCC)   Hypothyroidism   Bipolar 1 disorder, mixed, severe (HCC)   Acute lower UTI   Acute toxic metabolic encephalopathy: Suspected to be from urinary tract infection, electrolyte abnormalities.  Currently she has returned to her baseline mental status and is alert and oriented.  E. coli UTI: Being treated with antibiotic.  On oral antibiotic at present.  Leukocytosis resolved.  Afebrile.  Hyponatremia: Improved  Bipolar disorder: On Risperdal, Trileptal, Celexa  Morbid obesity: BMI of 53  Hypertension: Continue current meds.  Currently blood pressure stable  Hypothyroidism: Continue Synthyroid  Diabetes mellitus: Hemoglobin A1c was 6.1 in August 20.  Continue sliding scale insulin  Tobacco abuse: On nicotine patch  Hyperlipidemia: Continue Crestor, fenofibrate.         DVT prophylaxis: Lovenox Code Status: Full Family Communication: None present at the bedside Disposition Plan: Skilled nursing facility as soon as bed is available   Consultants: None  Procedures: None  Antimicrobials:  Anti-infectives (From admission, onward)   Start     Dose/Rate Route Frequency Ordered Stop   05/17/19 2200  cefdinir (OMNICEF) capsule 300 mg     300 mg Oral Every 12 hours 05/17/19 0943 05/20/19 2159   05/15/19 0200  cefTRIAXone (ROCEPHIN) 1 g in sodium  chloride 0.9 % 100 mL IVPB  Status:  Discontinued     1 g 200 mL/hr over 30 Minutes Intravenous Every 24 hours 05/14/19 0655 05/17/19 0943   05/14/19 0230  cefTRIAXone (ROCEPHIN) 1 g in sodium chloride 0.9 % 100 mL IVPB     1 g 200 mL/hr over 30 Minutes Intravenous  Once 05/14/19 0229 05/14/19 0525      Subjective: Patient seen and examined the bedside this morning.  Hemodynamically stable.  Alert and oriented.  Denies any complaints.  Objective: Vitals:   05/18/19 1342 05/18/19 2056 05/19/19 0500 05/19/19 0522  BP: 126/64 (!) 144/72  (!) 158/68  Pulse: 74 63  67  Resp: 18 17  17   Temp: 98.8 F (37.1 C) 97.7 F (36.5 C)  98 F (36.7 C)  TempSrc: Oral Oral  Axillary  SpO2: 95% 96%  94%  Weight:   136 kg   Height:        Intake/Output Summary (Last 24 hours) at 05/19/2019 1342 Last data filed at 05/19/2019 1207 Gross per 24 hour  Intake 1080 ml  Output 700 ml  Net 380 ml   Filed Weights   05/17/19 0547 05/18/19 0500 05/19/19 0500  Weight: 134.7 kg (!) 137 kg 136 kg    Examination:  General exam: Appears calm and comfortable ,Not in distress, obese HEENT:PERRL Ear/Nose normal on gross exam Respiratory system: Bilateral equal air entry, normal vesicular breath sounds, no wheezes or crackles  Cardiovascular system: S1 & S2 heard, RRR. No JVD, murmurs, rubs, gallops or clicks. No pedal edema. Gastrointestinal system: Abdomen is nondistended, soft and nontender. No organomegaly or masses felt. Normal bowel sounds heard. Central nervous  system: Alert and oriented. No focal neurological deficits. Extremities: No edema, no clubbing ,no cyanosis Skin: No rashes, lesions or ulcers,no icterus ,no pallor    Data Reviewed: I have personally reviewed following labs and imaging studies  CBC: Recent Labs  Lab 05/14/19 0130 05/15/19 0551 05/16/19 0612  WBC 13.4* 7.6 8.3  NEUTROABS 10.1*  --   --   HGB 14.0 13.2 13.6  HCT 43.9 42.3 41.9  MCV 95.2 96.4 93.1  PLT 227 157 149*     Basic Metabolic Panel: Recent Labs  Lab 05/14/19 0130 05/15/19 0551 05/16/19 0612  NA 132* 136 133*  K 4.3 4.7 4.5  CL 94* 101 99  CO2 25 26 23   GLUCOSE 140* 173* 140*  BUN 30* 18 16  CREATININE 1.04* 0.95 1.06*  CALCIUM 9.2 8.9 8.9   GFR: Estimated Creatinine Clearance: 68.8 mL/min (A) (by C-G formula based on SCr of 1.06 mg/dL (H)). Liver Function Tests: Recent Labs  Lab 05/14/19 0130 05/15/19 0551  AST 38 34  ALT 34 27  ALKPHOS 71 58  BILITOT 0.7 1.1  PROT 7.1 6.6  ALBUMIN 4.1 3.8   No results for input(s): LIPASE, AMYLASE in the last 168 hours. Recent Labs  Lab 05/14/19 0130  AMMONIA 14   Coagulation Profile: No results for input(s): INR, PROTIME in the last 168 hours. Cardiac Enzymes: No results for input(s): CKTOTAL, CKMB, CKMBINDEX, TROPONINI in the last 168 hours. BNP (last 3 results) No results for input(s): PROBNP in the last 8760 hours. HbA1C: No results for input(s): HGBA1C in the last 72 hours. CBG: Recent Labs  Lab 05/18/19 1134 05/18/19 1654 05/18/19 2202 05/19/19 0719 05/19/19 1109  GLUCAP 135* 143* 112* 130* 174*   Lipid Profile: No results for input(s): CHOL, HDL, LDLCALC, TRIG, CHOLHDL, LDLDIRECT in the last 72 hours. Thyroid Function Tests: No results for input(s): TSH, T4TOTAL, FREET4, T3FREE, THYROIDAB in the last 72 hours. Anemia Panel: No results for input(s): VITAMINB12, FOLATE, FERRITIN, TIBC, IRON, RETICCTPCT in the last 72 hours. Sepsis Labs: No results for input(s): PROCALCITON, LATICACIDVEN in the last 168 hours.  Recent Results (from the past 240 hour(s))  Urine culture     Status: Abnormal   Collection Time: 05/14/19  2:29 AM   Specimen: Urine, Clean Catch  Result Value Ref Range Status   Specimen Description   Final    URINE, CLEAN CATCH Performed at [facility], [address], [place], [place]    Special Requests   Final    NONE Performed at [facility],  [address], [place], [place]    Culture >=100,000 COLONIES/mL ESCHERICHIA COLI (A)  Final   Report Status 05/15/2019 FINAL  Final   Organism ID, Bacteria ESCHERICHIA COLI (A)  Final      Susceptibility   Escherichia coli - MIC*    AMPICILLIN >=32 RESISTANT Resistant     CEFAZOLIN <=4 SENSITIVE Sensitive     CEFTRIAXONE <=1 SENSITIVE Sensitive     CIPROFLOXACIN >=4 RESISTANT Resistant     GENTAMICIN >=16 RESISTANT Resistant     IMIPENEM <=0.25 SENSITIVE Sensitive     NITROFURANTOIN <=16 SENSITIVE Sensitive     TRIMETH/SULFA <=20 SENSITIVE Sensitive     AMPICILLIN/SULBACTAM 16 INTERMEDIATE Intermediate     PIP/TAZO <=4 SENSITIVE Sensitive     * >=100,000 COLONIES/mL ESCHERICHIA COLI  SARS CORONAVIRUS 2 (TAT 6-24 HRS) Nasopharyngeal Nasopharyngeal Swab     Status: None   Collection Time: 05/14/19  6:27 AM   Specimen: Nasopharyngeal Swab  Result Value Ref Range Status   SARS Coronavirus 2 NEGATIVE NEGATIVE Final    Comment: (NOTE) SARS-CoV-2 target nucleic acids are NOT DETECTED. The SARS-CoV-2 RNA is generally detectable in upper and lower respiratory specimens during the acute phase of infection. Negative results do not preclude SARS-CoV-2 infection, do not rule out co-infections with other pathogens, and should not be used as the sole basis for treatment or other patient management decisions. Negative results must be combined with clinical observations, patient history, and epidemiological information. The expected result is Negative. Fact Sheet for Patients: SugarRoll.be Fact Sheet for Healthcare Providers: https://www.woods-mathews.com/ This test is not yet approved or cleared by the [place] FDA and  has been authorized for detection and/or diagnosis of SARS-CoV-2 by FDA under an Emergency Use Authorization (EUA). This EUA will remain  in effect (meaning this test can be used) for the duration of the COVID-19  declaration under Section 56 4(b)(1) of the Act, 21 U.S.C. section 360bbb-3(b)(1), unless the authorization is terminated or revoked sooner. Performed at [facility], [place] [address], [place], [place] [address]          Radiology Studies: No results found.      Scheduled Meds: . aspirin  81 mg Oral Daily  . cefdinir  300 mg Oral Q12H  . citalopram  20 mg Oral Daily  . enoxaparin (LOVENOX) injection  60 mg Subcutaneous Q24H  . fenofibrate  160 mg Oral Q breakfast  . insulin aspart  0-5 Units Subcutaneous QHS  . insulin aspart  0-9 Units Subcutaneous TID WC  . levothyroxine  200 mcg Oral Q0600  . loratadine  10 mg Oral Daily  . Oxcarbazepine  300 mg Oral BID  . risperiDONE  6 mg Oral QHS  . rosuvastatin  40 mg Oral q1800   Continuous Infusions: . sodium chloride Stopped (05/16/19 0305)     LOS: 5 days    Time spent: 25 mins.More than 50% of that time was spent in counseling and/or coordination of care.      Shelly Coss, MD [facility] Pager [phone]  If 7PM-7AM, please contact night-coverage [email or web address] Password [email or web address] 05/19/2019, 1:42 PM

## 2019-05-20 LAB — BASIC METABOLIC PANEL
Anion gap: 11 (ref 5–15)
BUN: 22 mg/dL (ref 8–23)
CO2: 25 mmol/L (ref 22–32)
Calcium: 9.1 mg/dL (ref 8.9–10.3)
Chloride: 96 mmol/L — ABNORMAL LOW (ref 98–111)
Creatinine, Ser: 0.84 mg/dL (ref 0.44–1.00)
GFR calc Af Amer: >60 mL/min (ref >60)
GFR calc non Af Amer: >60 mL/min (ref >60)
Glucose, Bld: 181 mg/dL — ABNORMAL HIGH (ref 70–99)
Potassium: 3.9 mmol/L (ref 3.5–5.1)
Sodium: 132 mmol/L — ABNORMAL LOW (ref 135–145)

## 2019-05-20 LAB — GLUCOSE, CAPILLARY
Glucose-Capillary: 163 mg/dL — ABNORMAL HIGH (ref 70–99)
Glucose-Capillary: 190 mg/dL — ABNORMAL HIGH (ref 70–99)
Glucose-Capillary: 109 mg/dL — ABNORMAL HIGH (ref 70–99)
Glucose-Capillary: 161 mg/dL — ABNORMAL HIGH (ref 70–99)

## 2019-05-20 NOTE — Progress Notes (Signed)
PROGRESS NOTE    Julie Morrow  GXQ:[id number] DOB: [birth year] DOA: 05/13/2019 PCP: Patient, No Pcp Per   Brief Narrative:  Patient is a 69 year old female with history of hypertension, hyperlipidemia, hypothyroidism, diabetes mellitus, bipolar disorder who presented with confusion,multiple falls from home.  Currently she is hemodynamically stable.  Physical therapy recommended skilled nursing facility.  Waiting for insurance authorization which will likely happen on Monday.  Assessment & Plan:   Principal Problem:   AMS (altered mental status) Active Problems:   HLD (hyperlipidemia)   Diabetes type 2, controlled (HCC)   Hypothyroidism   Bipolar 1 disorder, mixed, severe (HCC)   Acute lower UTI   Acute toxic metabolic encephalopathy: Suspected to be from urinary tract infection, electrolyte abnormalities.  Currently she has returned to her baseline mental status and is alert and oriented.  E. coli UTI: Being treated with antibiotic.  On oral antibiotic at present.  Leukocytosis resolved.  Afebrile.  Hyponatremia: Improved  Bipolar disorder: On Risperdal, Trileptal, Celexa  Morbid obesity: BMI of 53  Hypertension: Continue current meds.  Currently blood pressure stable  Hypothyroidism: Continue Synthyroid  Diabetes mellitus: Hemoglobin A1c was 6.1 in August 20.  Continue sliding scale insulin  Tobacco abuse: On nicotine patch  Hyperlipidemia: Continue Crestor, fenofibrate.         DVT prophylaxis: Lovenox Code Status: Full Family Communication: None present at the bedside Disposition Plan: Skilled nursing facility as soon as bed is available   Consultants: None  Procedures: None  Antimicrobials:  Anti-infectives (From admission, onward)   Start     Dose/Rate Route Frequency Ordered Stop   05/17/19 2200  cefdinir (OMNICEF) capsule 300 mg     300 mg Oral Every 12 hours 05/17/19 0943 05/20/19 0852   05/15/19 0200  cefTRIAXone (ROCEPHIN) 1 g in sodium  chloride 0.9 % 100 mL IVPB  Status:  Discontinued     1 g 200 mL/hr over 30 Minutes Intravenous Every 24 hours 05/14/19 0655 05/17/19 0943   05/14/19 0230  cefTRIAXone (ROCEPHIN) 1 g in sodium chloride 0.9 % 100 mL IVPB     1 g 200 mL/hr over 30 Minutes Intravenous  Once 05/14/19 0229 05/14/19 0525      Subjective: Patient seen and examined the bedside this morning.  Hemodynamically stable for discharge as soon as the bed is available.  No active issues.  Objective: Vitals:   05/19/19 0522 05/19/19 1359 05/19/19 2012 05/20/19 0528  BP: (!) 158/68 (!) 103/46 138/62 (!) 122/53  Pulse: 67 64 62 63  Resp: 17 16 18 16   Temp: 98 F (36.7 C) 98.8 F (37.1 C) 98.3 F (36.8 C) 98.1 F (36.7 C)  TempSrc: Axillary Oral Oral Oral  SpO2: 94% 95% 91% 95%  Weight:    135.2 kg  Height:        Intake/Output Summary (Last 24 hours) at 05/20/2019 1159 Last data filed at 05/20/2019 0529 Gross per 24 hour  Intake 480 ml  Output 2400 ml  Net -1920 ml   Filed Weights   05/18/19 0500 05/19/19 0500 05/20/19 0528  Weight: (!) 137 kg 136 kg 135.2 kg    Examination:  General exam: Appears calm and comfortable ,Not in distress, obese HEENT:PERRL Ear/Nose normal on gross exam Respiratory system: Bilateral equal air entry, normal vesicular breath sounds, no wheezes or crackles  Cardiovascular system: S1 & S2 heard, RRR. No JVD, murmurs, rubs, gallops or clicks. No pedal edema. Gastrointestinal system: Abdomen is nondistended, soft and nontender. No organomegaly  or masses felt. Normal bowel sounds heard. Central nervous system: Alert and oriented. No focal neurological deficits. Extremities: No edema, no clubbing ,no cyanosis Skin: No rashes, lesions or ulcers,no icterus ,no pallor    Data Reviewed: I have personally reviewed following labs and imaging studies  CBC: Recent Labs  Lab 05/14/19 0130 05/15/19 0551 05/16/19 0612  WBC 13.4* 7.6 8.3  NEUTROABS 10.1*  --   --   HGB 14.0 13.2 13.6   HCT 43.9 42.3 41.9  MCV 95.2 96.4 93.1  PLT 227 157 149*   Basic Metabolic Panel: Recent Labs  Lab 05/14/19 0130 05/15/19 0551 05/16/19 0612 05/20/19 0634  NA 132* 136 133* 132*  K 4.3 4.7 4.5 3.9  CL 94* 101 99 96*  CO2 25 26 23 25   GLUCOSE 140* 173* 140* 181*  BUN 30* 18 16 22   CREATININE 1.04* 0.95 1.06* 0.84  CALCIUM 9.2 8.9 8.9 9.1   GFR: Estimated Creatinine Clearance: 86.5 mL/min (by C-G formula based on SCr of 0.84 mg/dL). Liver Function Tests: Recent Labs  Lab 05/14/19 0130 05/15/19 0551  AST 38 34  ALT 34 27  ALKPHOS 71 58  BILITOT 0.7 1.1  PROT 7.1 6.6  ALBUMIN 4.1 3.8   No results for input(s): LIPASE, AMYLASE in the last 168 hours. Recent Labs  Lab 05/14/19 0130  AMMONIA 14   Coagulation Profile: No results for input(s): INR, PROTIME in the last 168 hours. Cardiac Enzymes: No results for input(s): CKTOTAL, CKMB, CKMBINDEX, TROPONINI in the last 168 hours. BNP (last 3 results) No results for input(s): PROBNP in the last 8760 hours. HbA1C: No results for input(s): HGBA1C in the last 72 hours. CBG: Recent Labs  Lab 05/19/19 1109 05/19/19 1711 05/19/19 2016 05/20/19 0745 05/20/19 1146  GLUCAP 174* 93 159* 163* 190*   Lipid Profile: No results for input(s): CHOL, HDL, LDLCALC, TRIG, CHOLHDL, LDLDIRECT in the last 72 hours. Thyroid Function Tests: No results for input(s): TSH, T4TOTAL, FREET4, T3FREE, THYROIDAB in the last 72 hours. Anemia Panel: No results for input(s): VITAMINB12, FOLATE, FERRITIN, TIBC, IRON, RETICCTPCT in the last 72 hours. Sepsis Labs: No results for input(s): PROCALCITON, LATICACIDVEN in the last 168 hours.  Recent Results (from the past 240 hour(s))  Urine culture     Status: Abnormal   Collection Time: 05/14/19  2:29 AM   Specimen: Urine, Clean Catch  Result Value Ref Range Status   Specimen Description   Final    URINE, CLEAN CATCH Performed at [facility], [address]  [place], [place]    Special Requests   Final    NONE Performed at [facility], [address] [place], [place]    Culture >=100,000 COLONIES/mL ESCHERICHIA COLI (A)  Final   Report Status 05/15/2019 FINAL  Final   Organism ID, Bacteria ESCHERICHIA COLI (A)  Final      Susceptibility   Escherichia coli - MIC*    AMPICILLIN >=32 RESISTANT Resistant     CEFAZOLIN <=4 SENSITIVE Sensitive     CEFTRIAXONE <=1 SENSITIVE Sensitive     CIPROFLOXACIN >=4 RESISTANT Resistant     GENTAMICIN >=16 RESISTANT Resistant     IMIPENEM <=0.25 SENSITIVE Sensitive     NITROFURANTOIN <=16 SENSITIVE Sensitive     TRIMETH/SULFA <=20 SENSITIVE Sensitive     AMPICILLIN/SULBACTAM 16 INTERMEDIATE Intermediate     PIP/TAZO <=4 SENSITIVE Sensitive     * >=100,000 COLONIES/mL ESCHERICHIA COLI  SARS CORONAVIRUS 2 (TAT 6-24  HRS) Nasopharyngeal Nasopharyngeal Swab     Status: None   Collection Time: 05/14/19  6:27 AM   Specimen: Nasopharyngeal Swab  Result Value Ref Range Status   SARS Coronavirus 2 NEGATIVE NEGATIVE Final    Comment: (NOTE) SARS-CoV-2 target nucleic acids are NOT DETECTED. The SARS-CoV-2 RNA is generally detectable in upper and lower respiratory specimens during the acute phase of infection. Negative results do not preclude SARS-CoV-2 infection, do not rule out co-infections with other pathogens, and should not be used as the sole basis for treatment or other patient management decisions. Negative results must be combined with clinical observations, patient history, and epidemiological information. The expected result is Negative. Fact Sheet for Patients: HairSlick.no Fact Sheet for Healthcare Providers: quierodirigir.com This test is not yet approved or cleared by the [place] FDA and  has been authorized for detection and/or diagnosis of SARS-CoV-2 by FDA under an Emergency Use Authorization  (EUA). This EUA will remain  in effect (meaning this test can be used) for the duration of the COVID-19 declaration under Section 56 4(b)(1) of the Act, 21 U.S.C. section 360bbb-3(b)(1), unless the authorization is terminated or revoked sooner. Performed at [facility], [address] [place], [place] [address]          Radiology Studies: No results found.      Scheduled Meds: . aspirin  81 mg Oral Daily  . citalopram  20 mg Oral Daily  . enoxaparin (LOVENOX) injection  60 mg Subcutaneous Q24H  . fenofibrate  160 mg Oral Q breakfast  . insulin aspart  0-5 Units Subcutaneous QHS  . insulin aspart  0-9 Units Subcutaneous TID WC  . levothyroxine  200 mcg Oral Q0600  . loratadine  10 mg Oral Daily  . Oxcarbazepine  300 mg Oral BID  . risperiDONE  6 mg Oral QHS  . rosuvastatin  40 mg Oral q1800   Continuous Infusions: . sodium chloride Stopped (05/16/19 0305)     LOS: 6 days    Time spent: 15 mins.More than 50% of that time was spent in counseling and/or coordination of care.      Burnadette Pop, MD [facility] Pager [phone]  If 7PM-7AM, please contact night-coverage [email or web address] Password [email or web address] 05/20/2019, 11:59 AM

## 2019-05-21 LAB — SARS CORONAVIRUS 2 (TAT 6-24 HRS): SARS Coronavirus 2: NEGATIVE

## 2019-05-21 LAB — CREATININE, SERUM
Creatinine, Ser: 0.84 mg/dL (ref 0.44–1.00)
GFR calc Af Amer: >60 mL/min (ref >60)
GFR calc non Af Amer: >60 mL/min (ref >60)

## 2019-05-21 LAB — GLUCOSE, CAPILLARY
Glucose-Capillary: 135 mg/dL — ABNORMAL HIGH (ref 70–99)
Glucose-Capillary: 178 mg/dL — ABNORMAL HIGH (ref 70–99)
Glucose-Capillary: 93 mg/dL (ref 70–99)
Glucose-Capillary: 111 mg/dL — ABNORMAL HIGH (ref 70–99)

## 2019-05-21 NOTE — Progress Notes (Signed)
Physical Therapy Treatment Patient Details Name: Julie Morrow MRN: [id number] DOB: 1951/05/16 Today's Date: 05/21/2019    History of Present Illness Julie Morrow  is a 69 y.o. female, w hypertension, hyperlipidemia, Dm2, Hypothyroidism, Bipolar do , apparently presents with 3 falls in the past 24 hours, Pt admitted with AMS suspected to be from urinary tract infection, electrolyte abnormalities.    PT Comments    Pt required increased assist today for bed mobility and repositioning once back in supine.   Pt unable to stand with RW or stedy equipment with multiple attempts.  Pt would require +2 assist to safely mobilize.  Continue to recommend SNF upon d/c.   Follow Up Recommendations  SNF     Equipment Recommendations  None recommended by PT    Recommendations for Other Services       Precautions / Restrictions Precautions Precautions: Fall    Mobility  Bed Mobility Overal bed mobility: Needs Assistance Bed Mobility: Supine to Sit;Sit to Supine     Supine to sit: Mod assist Sit to supine: Mod assist   General bed mobility comments: assist for LEs over EOB and trunk upright, assist for LEs onto bed  Transfers Overall transfer level: Needs assistance Equipment used: Rolling walker (2 wheeled) Transfers: Sit to/from Stand Sit to Stand: Total assist;From elevated surface         General transfer comment: attempted to stand from slightly elevated bed x3 however pt unable with RW, provided stedy and attempted x3 however pt unable to assist; would need +2 assist  Ambulation/Gait                 Stairs             Wheelchair Mobility    Modified Rankin (Stroke Patients Only)       Balance Overall balance assessment: Needs assistance Sitting-balance support: Bilateral upper extremity supported Sitting balance-Leahy Scale: Poor Sitting balance - Comments: requires UE support     Standing balance-Leahy Scale: Zero Standing balance comment:  unable to stand today                            Cognition Arousal/Alertness: Awake/alert Behavior During Therapy: WFL for tasks assessed/performed Overall Cognitive Status: No family/caregiver present to determine baseline cognitive functioning                                 General Comments: Slow processing, multimodal cues      Exercises      General Comments        Pertinent Vitals/Pain Pain Assessment: Faces Faces Pain Scale: Hurts little more Pain Location: back Pain Descriptors / Indicators: Aching Pain Intervention(s): Repositioned;Monitored during session    Home Living                      Prior Function            PT Goals (current goals can now be found in the care plan section) Progress towards PT goals: Not progressing toward goals - comment(increased weakness)    Frequency    Min 3X/week      PT Plan Current plan remains appropriate    Co-evaluation              AM-PAC PT "6 Clicks" Mobility   Outcome Measure  Help needed turning from your back to your side  while in a flat bed without using bedrails?: A Lot Help needed moving from lying on your back to sitting on the side of a flat bed without using bedrails?: A Lot Help needed moving to and from a bed to a chair (including a wheelchair)?: A Lot Help needed standing up from a chair using your arms (e.g., wheelchair or bedside chair)?: Total Help needed to walk in hospital room?: Total Help needed climbing 3-5 steps with a railing? : Total 6 Click Score: 9    End of Session Equipment Utilized During Treatment: Gait belt Activity Tolerance: Patient tolerated treatment well Patient left: in bed;with call bell/phone within reach;with bed alarm set Nurse Communication: Mobility status PT Visit Diagnosis: Muscle weakness (generalized) (M62.81)     Time: 8264-1583 PT Time Calculation (min) (ACUTE ONLY): 23 min  Charges:  $Therapeutic Activity: 23-37  mins                     [name] PT, DPT Acute Rehabilitation Services Office: [phone]  [name] 05/21/2019, 4:51 PM

## 2019-05-21 NOTE — TOC Progression Note (Signed)
Transition of Care [facility]) - Progression Note    Patient Details  Name: Julie Morrow MRN: [id number] Date of Birth: 1951-05-05  Transition of Care [facility]) CM/SW Contact  Clearance Coots, LCSW Phone Number:  05/21/2019, 3:29 PM  Clinical Narrative:    Patient has 2 Bed offers, [facility] and [facility]. CSW notified patient sister about bed offers and how to reference [email or web address] Patient chose [facility]. Facility will accept the patient tomorrow. Insurance Authorization-[id number]  COVID-19 test pending.   Patient agreeable and reports understanding the plan.      Expected Discharge Plan: Skilled Nursing Facility Barriers to Discharge: English as a second language teacher, Other (comment)(COVID-19 test)  Expected Discharge Plan and Services Expected Discharge Plan: Skilled Nursing Facility In-house Referral: Clinical Social Work Discharge Planning Services: CM Consult Post Acute Care Choice: Home Health Living arrangements for the past 2 months: Single Family Home Expected Discharge Date: 05/21/19                         HH Arranged: PT, OT HH Agency: [facility] Date [facility] Agency Contacted: 05/16/19 Time HH Agency Contacted: 1600 Representative spoke with at [facility] Agency: Denyse Amass   Social Determinants of Health (SDOH) Interventions    Readmission Risk Interventions No flowsheet data found.

## 2019-05-21 NOTE — Discharge Summary (Addendum)
Physician Discharge Summary  Julie Morrow PYP:[id number] DOB: 1950/10/14 DOA: 05/13/2019  PCP: Patient, No Pcp Per  Admit date: 05/13/2019 Discharge date: 05/22/19  Admitted From: Home Disposition:  SNF  Discharge Condition:Stable CODE STATUS:FULL Diet recommendation: Heart Healthy  Brief/Interim Summary: Patient is a 69 year old female with history of hypertension, hyperlipidemia, hypothyroidism, diabetes mellitus, bipolar disorder who presented with confusion,multiple falls from home.  Currently she is hemodynamically stable.  Physical therapy recommended skilled nursing facility.    She is stable for discharge to skilled nursing facility as soon as bed is available.  Following problems were addressed during her hospitalization:  Acute toxic metabolic encephalopathy: Suspected to be from urinary tract infection, electrolyte abnormalities.  Currently she has returned to her baseline mental status and is alert and oriented.  E. coli UTI: Completed abx course.  Leukocytosis resolved.  Afebrile.  Hyponatremia: Improved  Bipolar disorder: On Risperdal, Trileptal, Celexa  Morbid obesity: BMI of 53  Hypertension:   Currently blood pressure stable  Hypothyroidism: Continue Synthyroid  Diabetes mellitus: Hemoglobin A1c was 6.1 in August 20.  Continue home regimen  Tobacco abuse: Was on  nicotine patch  Hyperlipidemia: Continue Crestor, fenofibrate.   Discharge Diagnoses:  Principal Problem:   AMS (altered mental status) Active Problems:   HLD (hyperlipidemia)   Diabetes type 2, controlled (HCC)   Hypothyroidism   Bipolar 1 disorder, mixed, severe (HCC)   Acute lower UTI    Discharge Instructions  Discharge Instructions    Diet - low sodium heart healthy   Complete by: As directed    Discharge instructions   Complete by: As directed    1)Please do a CBC and BMP tests in  A week. 2)Take your medications as instructed. 3)Follow up with your psychiatrist as  an outpatient.   Increase activity slowly   Complete by: As directed      Allergies as of 05/22/2019   No Known Allergies     Medication List    STOP taking these medications   benztropine 0.5 MG tablet Commonly known as: COGENTIN   losartan 25 MG tablet Commonly known as: COZAAR   QUEtiapine 100 MG tablet Commonly known as: SEROQUEL   temazepam 15 MG capsule Commonly known as: RESTORIL     TAKE these medications   aspirin 81 MG chewable tablet Chew 1 tablet (81 mg total) by mouth daily.   BIOTIN PO Take 1 tablet by mouth daily.   Centrum tablet Take 1 tablet by mouth daily.   citalopram 20 MG tablet Commonly known as: CELEXA Take 20 mg by mouth daily.   fenofibrate 160 MG tablet Take 1 tablet (160 mg total) by mouth daily with breakfast.   Fish Oil 1000 MG Caps Take 1,000 mg by mouth daily.   gabapentin 100 MG capsule Commonly known as: NEURONTIN Take 100 mg by mouth 2 (two) times daily.   glipiZIDE 5 MG 24 hr tablet Commonly known as: GLUCOTROL XL Take 5 mg by mouth daily.   ibuprofen 800 MG tablet Commonly known as: ADVIL Take 800 mg by mouth every 6 (six) hours as needed.   levothyroxine 200 MCG tablet Commonly known as: SYNTHROID Take 1 tablet (200 mcg total) by mouth daily before breakfast.   loratadine 10 MG tablet Commonly known as: CLARITIN Take 1 tablet (10 mg total) by mouth daily.   metFORMIN 500 MG tablet Commonly known as: GLUCOPHAGE Take 1 tablet (500 mg total) by mouth daily with breakfast.   Oxcarbazepine 300 MG tablet Commonly known  as: TRILEPTAL Take 300 mg by mouth 2 (two) times daily.   risperiDONE 3 MG tablet Commonly known as: RisperDAL Take 2 tablets (6 mg total) by mouth at bedtime.   rosuvastatin 40 MG tablet Commonly known as: CRESTOR Take 40 mg by mouth daily.      Contact information for after-discharge care    Destination    [facility] .   Service: Skilled Nursing Contact information: [address]  [address] [place] [id number] [phone]             No Known Allergies  Consultations:  None   Procedures/Studies: CT Head Wo Contrast  Result Date: 05/14/2019 CLINICAL DATA:  Mental status change complaining of fall EXAM: CT HEAD WITHOUT CONTRAST TECHNIQUE: Contiguous axial images were obtained from the base of the skull through the vertex without intravenous contrast. COMPARISON:  October 24, 2018 FINDINGS: Brain: No evidence of acute territorial infarction, hemorrhage, hydrocephalus,extra-axial collection or mass lesion/mass effect. Again noted is the right posterior occipital lobe are craniectomy changes with areas of encephalomalacia and postsurgical cyst at the craniectomy site which is stable dating back to 2019. There is dilatation the ventricles and sulci consistent with age-related atrophy. Low-attenuation changes in the deep white matter consistent with small vessel ischemia. Vascular: No hyperdense vessel or unexpected calcification. Skull: Posterior craniectomy site at the right occipital lobe. No acute fracture seen. Sinuses/Orbits: The visualized paranasal sinuses and mastoid air cells are clear. The orbits and globes intact. Other: None IMPRESSION: No acute intracranial abnormality. Findings consistent with age related atrophy and chronic small vessel ischemia Posterior right occipital craniectomy with stable postsurgical changes. Electronically Signed   By: Jonna ClarkBindu  Avutu M.D.   On: 05/14/2019 01:54   DG Chest Portable 1 View  Result Date: 05/14/2019 CLINICAL DATA:  Altered mental status EXAM: PORTABLE CHEST 1 VIEW COMPARISON:  Radiograph December 27, 2018 FINDINGS: Low volumes with streaky atelectatic changes. No consolidation, features of edema, pneumothorax, or effusion. Suspect cardiomegaly though likely accentuated by low volumes and portable technique. Remaining cardiomediastinal contours are unremarkable. No acute osseous or soft tissue abnormality.  Degenerative changes are present in the imaged spine and shoulders. IMPRESSION: 1. Low volume chest x-ray with streaky opacities favoring atelectatic change. 2. Suspect cardiomegaly though likely accentuated by low volumes and portable technique. Electronically Signed   By: Kreg ShropshirePrice  DeHay M.D.   On: 05/14/2019 02:32      Subjective: Patient seen and examined at the bedside this morning.  Hemodynamically stable for discharge.  Discharge Exam: Vitals:   05/21/19 2121 05/22/19 0522  BP: (!) 124/41 (!) 111/57  Pulse: 61 63  Resp: 19 19  Temp: 98.2 F (36.8 C) 97.7 F (36.5 C)  SpO2: 92% 95%   Vitals:   05/21/19 1356 05/21/19 2121 05/22/19 0500 05/22/19 0522  BP: (!) 122/58 (!) 124/41  (!) 111/57  Pulse: 60 61  63  Resp: 18 19  19   Temp: 97.6 F (36.4 C) 98.2 F (36.8 C)  97.7 F (36.5 C)  TempSrc: Oral Oral  Oral  SpO2: 100% 92%  95%  Weight:   135 kg   Height:        General: Pt is alert, awake, not in acute distress Cardiovascular: RRR, S1/S2 +, no rubs, no gallops Respiratory: CTA bilaterally, no wheezing, no rhonchi Abdominal: Soft, NT, ND, bowel sounds + Extremities: no edema, no cyanosis    The results of significant diagnostics from this hospitalization (including imaging, microbiology, ancillary and laboratory) are listed  below for reference.     Microbiology: Recent Results (from the past 240 hour(s))  Urine culture     Status: Abnormal   Collection Time: 05/14/19  2:29 AM   Specimen: Urine, Clean Catch  Result Value Ref Range Status   Specimen Description   Final    URINE, CLEAN CATCH Performed at [facility], [place] [address] [place], [place] [address]    Special Requests   Final    NONE Performed at [facility], [place] [address] [place], [place] [address]    Culture >=100,000 COLONIES/mL ESCHERICHIA COLI (A)  Final   Report Status 05/15/2019 FINAL  Final   Organism ID, Bacteria ESCHERICHIA COLI (A)  Final       Susceptibility   Escherichia coli - MIC*    AMPICILLIN >=32 RESISTANT Resistant     CEFAZOLIN <=4 SENSITIVE Sensitive     CEFTRIAXONE <=1 SENSITIVE Sensitive     CIPROFLOXACIN >=4 RESISTANT Resistant     GENTAMICIN >=16 RESISTANT Resistant     IMIPENEM <=0.25 SENSITIVE Sensitive     NITROFURANTOIN <=16 SENSITIVE Sensitive     TRIMETH/SULFA <=20 SENSITIVE Sensitive     AMPICILLIN/SULBACTAM 16 INTERMEDIATE Intermediate     PIP/TAZO <=4 SENSITIVE Sensitive     * >=100,000 COLONIES/mL ESCHERICHIA COLI  SARS CORONAVIRUS 2 (TAT 6-24 HRS) Nasopharyngeal Nasopharyngeal Swab     Status: None   Collection Time: 05/14/19  6:27 AM   Specimen: Nasopharyngeal Swab  Result Value Ref Range Status   SARS Coronavirus 2 NEGATIVE NEGATIVE Final    Comment: (NOTE) SARS-CoV-2 target nucleic acids are NOT DETECTED. The SARS-CoV-2 RNA is generally detectable in upper and lower respiratory specimens during the acute phase of infection. Negative results do not preclude SARS-CoV-2 infection, do not rule out co-infections with other pathogens, and should not be used as the sole basis for treatment or other patient management decisions. Negative results must be combined with clinical observations, patient history, and epidemiological information. The expected result is Negative. Fact Sheet for Patients: SugarRoll.be Fact Sheet for Healthcare Providers: https://www.woods-mathews.com/ This test is not yet approved or cleared by the [place] FDA and  has been authorized for detection and/or diagnosis of SARS-CoV-2 by FDA under an Emergency Use Authorization (EUA). This EUA will remain  in effect (meaning this test can be used) for the duration of the COVID-19 declaration under Section 56 4(b)(1) of the Act, 21 U.S.C. section 360bbb-3(b)(1), unless the authorization is terminated or revoked sooner. Performed at [facility], [place] [address] [place],  [place] [address]   SARS CORONAVIRUS 2 (TAT 6-24 HRS) Nasopharyngeal Nasopharyngeal Swab     Status: None   Collection Time: 05/21/19  5:15 PM   Specimen: Nasopharyngeal Swab  Result Value Ref Range Status   SARS Coronavirus 2 NEGATIVE NEGATIVE Final    Comment: (NOTE) SARS-CoV-2 target nucleic acids are NOT DETECTED. The SARS-CoV-2 RNA is generally detectable in upper and lower respiratory specimens during the acute phase of infection. Negative results do not preclude SARS-CoV-2 infection, do not rule out co-infections with other pathogens, and should not be used as the sole basis for treatment or other patient management decisions. Negative results must be combined with clinical observations, patient history, and epidemiological information. The expected result is Negative. Fact Sheet for Patients: SugarRoll.be Fact Sheet for Healthcare Providers: https://www.woods-mathews.com/ This test is not yet approved or cleared by the [place] FDA and  has been authorized for detection and/or diagnosis of  SARS-CoV-2 by FDA under an Emergency Use Authorization (EUA). This EUA will remain  in effect (meaning this test can be used) for the duration of the COVID-19 declaration under Section 56 4(b)(1) of the Act, 21 U.S.C. section 360bbb-3(b)(1), unless the authorization is terminated or revoked sooner. Performed at [facility], [address], [place], [place] [address]      Labs: BNP (last 3 results) No results for input(s): BNP in the last 8760 hours. Basic Metabolic Panel: Recent Labs  Lab 05/16/19 0612 05/20/19 0634 05/21/19 0536  NA 133* 132*  --   K 4.5 3.9  --   CL 99 96*  --   CO2 23 25  --   GLUCOSE 140* 181*  --   BUN 16 22  --   CREATININE 1.06* 0.84 0.84  CALCIUM 8.9 9.1  --    Liver Function Tests: No results for input(s): AST, ALT, ALKPHOS, BILITOT, PROT, ALBUMIN in the last 168 hours. No results for input(s): LIPASE,  AMYLASE in the last 168 hours. No results for input(s): AMMONIA in the last 168 hours. CBC: Recent Labs  Lab 05/16/19 0612 05/22/19 0513  WBC 8.3 7.9  HGB 13.6 13.7  HCT 41.9 43.3  MCV 93.1 95.0  PLT 149* 188   Cardiac Enzymes: No results for input(s): CKTOTAL, CKMB, CKMBINDEX, TROPONINI in the last 168 hours. BNP: Invalid input(s): POCBNP CBG: Recent Labs  Lab 05/21/19 0742 05/21/19 1132 05/21/19 1654 05/21/19 2149 05/22/19 0741  GLUCAP 135* 178* 93 111* 116*   D-Dimer No results for input(s): DDIMER in the last 72 hours. Hgb A1c No results for input(s): HGBA1C in the last 72 hours. Lipid Profile No results for input(s): CHOL, HDL, LDLCALC, TRIG, CHOLHDL, LDLDIRECT in the last 72 hours. Thyroid function studies No results for input(s): TSH, T4TOTAL, T3FREE, THYROIDAB in the last 72 hours.  Invalid input(s): FREET3 Anemia work up No results for input(s): VITAMINB12, FOLATE, FERRITIN, TIBC, IRON, RETICCTPCT in the last 72 hours. Urinalysis    Component Value Date/Time   COLORURINE YELLOW 05/14/2019 0130   APPEARANCEUR CLEAR 05/14/2019 0130   APPEARANCEUR Cloudy 10/30/2012 1857   LABSPEC 1.012 05/14/2019 0130   LABSPEC 1.019 10/30/2012 1857   PHURINE 6.0 05/14/2019 0130   GLUCOSEU NEGATIVE 05/14/2019 0130   GLUCOSEU Negative 10/30/2012 1857   HGBUR NEGATIVE 05/14/2019 0130   BILIRUBINUR NEGATIVE 05/14/2019 0130   BILIRUBINUR Negative 10/30/2012 1857   KETONESUR NEGATIVE 05/14/2019 0130   PROTEINUR NEGATIVE 05/14/2019 0130   NITRITE POSITIVE (A) 05/14/2019 0130   LEUKOCYTESUR LARGE (A) 05/14/2019 0130   LEUKOCYTESUR Trace 10/30/2012 1857   Sepsis Labs Invalid input(s): PROCALCITONIN,  WBC,  LACTICIDVEN Microbiology Recent Results (from the past 240 hour(s))  Urine culture     Status: Abnormal   Collection Time: 05/14/19  2:29 AM   Specimen: Urine, Clean Catch  Result Value Ref Range Status   Specimen Description   Final    URINE, CLEAN CATCH Performed  at [facility], [address], [place], [place] [address]    Special Requests   Final    NONE Performed at [facility], [address] [place], [place] [address]    Culture >=100,000 COLONIES/mL ESCHERICHIA COLI (A)  Final   Report Status 05/15/2019 FINAL  Final   Organism ID, Bacteria ESCHERICHIA COLI (A)  Final      Susceptibility   Escherichia coli - MIC*    AMPICILLIN >=32 RESISTANT Resistant     CEFAZOLIN <=  4 SENSITIVE Sensitive     CEFTRIAXONE <=1 SENSITIVE Sensitive     CIPROFLOXACIN >=4 RESISTANT Resistant     GENTAMICIN >=16 RESISTANT Resistant     IMIPENEM <=0.25 SENSITIVE Sensitive     NITROFURANTOIN <=16 SENSITIVE Sensitive     TRIMETH/SULFA <=20 SENSITIVE Sensitive     AMPICILLIN/SULBACTAM 16 INTERMEDIATE Intermediate     PIP/TAZO <=4 SENSITIVE Sensitive     * >=100,000 COLONIES/mL ESCHERICHIA COLI  SARS CORONAVIRUS 2 (TAT 6-24 HRS) Nasopharyngeal Nasopharyngeal Swab     Status: None   Collection Time: 05/14/19  6:27 AM   Specimen: Nasopharyngeal Swab  Result Value Ref Range Status   SARS Coronavirus 2 NEGATIVE NEGATIVE Final    Comment: (NOTE) SARS-CoV-2 target nucleic acids are NOT DETECTED. The SARS-CoV-2 RNA is generally detectable in upper and lower respiratory specimens during the acute phase of infection. Negative results do not preclude SARS-CoV-2 infection, do not rule out co-infections with other pathogens, and should not be used as the sole basis for treatment or other patient management decisions. Negative results must be combined with clinical observations, patient history, and epidemiological information. The expected result is Negative. Fact Sheet for Patients: HairSlick.no Fact Sheet for Healthcare Providers: quierodirigir.com This test is not yet approved or cleared by the [place] FDA and  has been authorized for detection and/or diagnosis of  SARS-CoV-2 by FDA under an Emergency Use Authorization (EUA). This EUA will remain  in effect (meaning this test can be used) for the duration of the COVID-19 declaration under Section 56 4(b)(1) of the Act, 21 U.S.C. section 360bbb-3(b)(1), unless the authorization is terminated or revoked sooner. Performed at [facility], [address] [place], [place] [address]   SARS CORONAVIRUS 2 (TAT 6-24 HRS) Nasopharyngeal Nasopharyngeal Swab     Status: None   Collection Time: 05/21/19  5:15 PM   Specimen: Nasopharyngeal Swab  Result Value Ref Range Status   SARS Coronavirus 2 NEGATIVE NEGATIVE Final    Comment: (NOTE) SARS-CoV-2 target nucleic acids are NOT DETECTED. The SARS-CoV-2 RNA is generally detectable in upper and lower respiratory specimens during the acute phase of infection. Negative results do not preclude SARS-CoV-2 infection, do not rule out co-infections with other pathogens, and should not be used as the sole basis for treatment or other patient management decisions. Negative results must be combined with clinical observations, patient history, and epidemiological information. The expected result is Negative. Fact Sheet for Patients: HairSlick.no Fact Sheet for Healthcare Providers: quierodirigir.com This test is not yet approved or cleared by the [place] FDA and  has been authorized for detection and/or diagnosis of SARS-CoV-2 by FDA under an Emergency Use Authorization (EUA). This EUA will remain  in effect (meaning this test can be used) for the duration of the COVID-19 declaration under Section 56 4(b)(1) of the Act, 21 U.S.C. section 360bbb-3(b)(1), unless the authorization is terminated or revoked sooner. Performed at [facility], [address] [place], [place] [address]     Please note: You were cared for by a hospitalist during your hospital stay. Once you are discharged, your  primary care physician will handle any further medical issues. Please note that NO REFILLS for any discharge medications will be authorized once you are discharged, as it is imperative that you return to your primary care physician (or establish a relationship with a primary care physician if you do not have one) for your post hospital discharge needs so that they can reassess your need  for medications and monitor your lab values.    Time coordinating discharge: 40 minutes  SIGNED:   Burnadette PopAmrit Hunter Bachar, MD  [facility] 05/22/2019, 9:54 AM Pager [phone]  If 7PM-7AM, please contact night-coverage [email or web address] Password [email or web address]

## 2019-05-21 NOTE — Care Management Important Message (Signed)
Important Message  Patient Details IM Letter given to [name] SW Case Manager to present to the Patient Name: Julie Morrow MRN: [id number] Date of Birth: 03-Feb-1951   Medicare Important Message Given:  Yes     Caren Macadam 05/21/2019, 11:25 AM

## 2019-05-22 LAB — GLUCOSE, CAPILLARY
Glucose-Capillary: 116 mg/dL — ABNORMAL HIGH (ref 70–99)
Glucose-Capillary: 133 mg/dL — ABNORMAL HIGH (ref 70–99)

## 2019-05-22 LAB — CBC
HCT: 43.3 % (ref 36.0–46.0)
Hemoglobin: 13.7 g/dL (ref 12.0–15.0)
MCH: 30 pg (ref 26.0–34.0)
MCHC: 31.6 g/dL (ref 30.0–36.0)
MCV: 95 fL (ref 80.0–100.0)
Platelets: 188 10*3/uL (ref 150–400)
RBC: 4.56 MIL/uL (ref 3.87–5.11)
RDW: 15 % (ref 11.5–15.5)
WBC: 7.9 10*3/uL (ref 4.0–10.5)
nRBC: 0 % (ref 0.0–0.2)

## 2019-05-22 NOTE — Progress Notes (Signed)
Discussed with patient discharge instructions, they verbalized agreement and understanding. Called report to [employer] to [place], Charity fundraiser. Patient to leave by [facility] with all belongings.

## 2019-05-22 NOTE — Progress Notes (Signed)
Patient seen and examined at the bedside this morning.  Hemodynamically stable for discharge.  No new changes from yesterday.

## 2019-05-22 NOTE — TOC Transition Note (Signed)
Transition of Care [facility]) - CM/SW Discharge Note   Patient Details  Name: Julie Morrow MRN: [id number] Date of Birth: 04/23/1951  [facility] CM/SW Contact:  Clearance Coots, LCSW Phone Number: 05/22/2019, 11:04 AM   Clinical Narrative:    Authorization [id number]- 3 days starting 1/4-1/6 [name] [phone] Patient discharge to [facility].  Nurse call report to: [address] [phone] "ask for [place], Charity fundraiser) Sharin Mons to transport.     Final next level of care: Skilled Nursing Facility Barriers to Discharge: Barriers Resolved   Patient Goals and CMS Choice Patient states their goals for this hospitalization and ongoing recovery are:: go to rehab CMS Medicare.gov Compare Post Acute Care list provided to:: Patient Choice offered to / list presented to : Patient  Discharge Placement PASRR number recieved: 05/18/19            Patient chooses bed at: [facility] Patient to be transferred to facility by: [facility] Name of family member notified: Victorio Palm Patient and family notified of of transfer: 05/22/19  Discharge Plan and Services In-house Referral: Clinical Social Work Discharge Planning Services: CM Consult Post Acute Care Choice: Home Health                    HH Arranged: PT, OT [facility] Agency: [facility] Date [facility] Agency Contacted: 05/16/19 Time HH Agency Contacted: 1600 Representative spoke with at [facility] Agency: Denyse Amass  Social Determinants of Health (SDOH) Interventions     Readmission Risk Interventions No flowsheet data found.

## 2019-07-12 ENCOUNTER — Ambulatory Visit: Payer: Medicare HMO | Attending: Internal Medicine

## 2019-07-12 DIAGNOSIS — Z23 Encounter for immunization: Secondary | ICD-10-CM | POA: Insufficient documentation

## 2019-07-12 NOTE — Progress Notes (Signed)
   Covid-19 Vaccination Clinic  Name:  Julie Morrow    MRN: [id number] DOB: Oct 13, 1950  07/12/2019  Julie Morrow was observed post Covid-19 immunization for 15 minutes without incidence. She was provided with Vaccine Information Sheet and instruction to access the V-Safe system.   Julie Morrow was instructed to call 911 with any severe reactions post vaccine: Marland Kitchen Difficulty breathing  . Swelling of your face and throat  . A fast heartbeat  . A bad rash all over your body  . Dizziness and weakness    Immunizations Administered    Name Date Dose VIS Date Route   Pfizer COVID-19 Vaccine 07/12/2019 10:42 AM 0.3 mL 04/27/2019 Intramuscular   Manufacturer: [employer], [employer]   Lot: [id number]   NDC: [address]-1

## 2019-08-07 ENCOUNTER — Ambulatory Visit: Payer: Self-pay

## 2019-08-07 ENCOUNTER — Ambulatory Visit: Payer: Medicare HMO | Attending: Internal Medicine

## 2019-08-07 DIAGNOSIS — Z23 Encounter for immunization: Secondary | ICD-10-CM

## 2019-08-07 NOTE — Progress Notes (Signed)
   Covid-19 Vaccination Clinic  Name:  Julie Morrow    MRN: [id number] DOB: 11/22/1950  08/07/2019  [name] was observed post Covid-19 immunization for 15 minutes without incident. She was provided with Vaccine Information Sheet and instruction to access the V-Safe system.   [name] was instructed to call 911 with any severe reactions post vaccine: Marland Kitchen Difficulty breathing  . Swelling of face and throat  . A fast heartbeat  . A bad rash all over body  . Dizziness and weakness   Immunizations Administered    Name Date Dose VIS Date Route   Pfizer COVID-19 Vaccine 08/07/2019 11:19 AM 0.3 mL 04/27/2019 Intramuscular   Manufacturer: [employer], [employer]   Lot: [id number]   NDC: [address]-8

## 2019-08-08 ENCOUNTER — Ambulatory Visit: Payer: Self-pay

## 2019-08-29 ENCOUNTER — Ambulatory Visit: Payer: Self-pay | Admitting: Dermatology

## 2019-10-18 ENCOUNTER — Ambulatory Visit (INDEPENDENT_AMBULATORY_CARE_PROVIDER_SITE_OTHER): Payer: Medicare Other | Admitting: Vascular Surgery

## 2019-10-18 ENCOUNTER — Encounter (INDEPENDENT_AMBULATORY_CARE_PROVIDER_SITE_OTHER): Payer: Medicare Other

## 2019-11-02 ENCOUNTER — Other Ambulatory Visit: Payer: Self-pay | Admitting: Internal Medicine

## 2019-11-02 DIAGNOSIS — Z1231 Encounter for screening mammogram for malignant neoplasm of breast: Secondary | ICD-10-CM

## 2019-12-03 ENCOUNTER — Other Ambulatory Visit: Payer: Self-pay

## 2019-12-03 ENCOUNTER — Emergency Department (HOSPITAL_COMMUNITY)
Admission: EM | Admit: 2019-12-03 | Discharge: 2019-12-05 | Disposition: A | Discharge status: Home / Self Care | Payer: Medicare HMO | Attending: Emergency Medicine | Admitting: Emergency Medicine

## 2019-12-03 ENCOUNTER — Encounter (HOSPITAL_COMMUNITY): Payer: Self-pay

## 2019-12-03 DIAGNOSIS — Z7982 Long term (current) use of aspirin: Secondary | ICD-10-CM | POA: Insufficient documentation

## 2019-12-03 DIAGNOSIS — Z87891 Personal history of nicotine dependence: Secondary | ICD-10-CM | POA: Insufficient documentation

## 2019-12-03 DIAGNOSIS — Z638 Other specified problems related to primary support group: Secondary | ICD-10-CM

## 2019-12-03 DIAGNOSIS — Z7984 Long term (current) use of oral hypoglycemic drugs: Secondary | ICD-10-CM | POA: Insufficient documentation

## 2019-12-03 DIAGNOSIS — F319 Bipolar disorder, unspecified: Secondary | ICD-10-CM | POA: Diagnosis not present

## 2019-12-03 DIAGNOSIS — R259 Unspecified abnormal involuntary movements: Secondary | ICD-10-CM | POA: Insufficient documentation

## 2019-12-03 DIAGNOSIS — Z20822 Contact with and (suspected) exposure to covid-19: Secondary | ICD-10-CM | POA: Diagnosis not present

## 2019-12-03 DIAGNOSIS — Z8659 Personal history of other mental and behavioral disorders: Secondary | ICD-10-CM

## 2019-12-03 DIAGNOSIS — E039 Hypothyroidism, unspecified: Secondary | ICD-10-CM | POA: Diagnosis not present

## 2019-12-03 DIAGNOSIS — Z046 Encounter for general psychiatric examination, requested by authority: Secondary | ICD-10-CM

## 2019-12-03 DIAGNOSIS — E119 Type 2 diabetes mellitus without complications: Secondary | ICD-10-CM | POA: Diagnosis not present

## 2019-12-03 DIAGNOSIS — F4325 Adjustment disorder with mixed disturbance of emotions and conduct: Secondary | ICD-10-CM | POA: Diagnosis present

## 2019-12-03 DIAGNOSIS — Z79899 Other long term (current) drug therapy: Secondary | ICD-10-CM | POA: Diagnosis not present

## 2019-12-03 LAB — RAPID URINE DRUG SCREEN, HOSP PERFORMED
Amphetamines: NOT DETECTED
Barbiturates: NOT DETECTED
Benzodiazepines: NOT DETECTED
Cocaine: NOT DETECTED
Opiates: NOT DETECTED
Tetrahydrocannabinol: NOT DETECTED

## 2019-12-03 LAB — COMPREHENSIVE METABOLIC PANEL
ALT: 23 U/L (ref 0–44)
AST: 35 U/L (ref 15–41)
Albumin: 4.3 g/dL (ref 3.5–5.0)
Alkaline Phosphatase: 63 U/L (ref 38–126)
Anion gap: 10 (ref 5–15)
BUN: 21 mg/dL (ref 8–23)
CO2: 26 mmol/L (ref 22–32)
Calcium: 9.3 mg/dL (ref 8.9–10.3)
Chloride: 99 mmol/L (ref 98–111)
Creatinine, Ser: 1.31 mg/dL — ABNORMAL HIGH (ref 0.44–1.00)
GFR calc Af Amer: 48 mL/min — ABNORMAL LOW (ref >60)
GFR calc non Af Amer: 42 mL/min — ABNORMAL LOW (ref >60)
Glucose, Bld: 162 mg/dL — ABNORMAL HIGH (ref 70–99)
Potassium: 3.9 mmol/L (ref 3.5–5.1)
Sodium: 135 mmol/L (ref 135–145)
Total Bilirubin: 0.3 mg/dL (ref 0.3–1.2)
Total Protein: 7.3 g/dL (ref 6.5–8.1)

## 2019-12-03 LAB — CBC
HCT: 43.2 % (ref 36.0–46.0)
Hemoglobin: 13.6 g/dL (ref 12.0–15.0)
MCH: 29.2 pg (ref 26.0–34.0)
MCHC: 31.5 g/dL (ref 30.0–36.0)
MCV: 92.9 fL (ref 80.0–100.0)
Platelets: 242 10*3/uL (ref 150–400)
RBC: 4.65 MIL/uL (ref 3.87–5.11)
RDW: 15.3 % (ref 11.5–15.5)
WBC: 12.1 10*3/uL — ABNORMAL HIGH (ref 4.0–10.5)
nRBC: 0 % (ref 0.0–0.2)

## 2019-12-03 LAB — SALICYLATE LEVEL: Salicylate Lvl: <7 mg/dL — ABNORMAL LOW (ref 7.0–30.0)

## 2019-12-03 LAB — ACETAMINOPHEN LEVEL: Acetaminophen (Tylenol), Serum: <10 ug/mL — ABNORMAL LOW (ref 10–30)

## 2019-12-03 LAB — ETHANOL: Alcohol, Ethyl (B): <10 mg/dL (ref <10)

## 2019-12-03 MED ORDER — ACETAMINOPHEN 325 MG PO TABS: 650.0000 mg | ORAL_TABLET | ORAL | Status: DC | PRN

## 2019-12-03 MED ORDER — ONDANSETRON HCL 4 MG PO TABS: 4.0000 mg | ORAL_TABLET | Freq: Three times a day (TID) | ORAL | Status: DC | PRN

## 2019-12-03 MED ORDER — NICOTINE 21 MG/24HR TD PT24
21.0000 mg | MEDICATED_PATCH | Freq: Every day | TRANSDERMAL | Status: DC
  Administered 2019-12-05: 21 mg via TRANSDERMAL
  Filled 2019-12-03: qty 1
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
## 2019-12-03 NOTE — ED Provider Notes (Signed)
Patient under IVC.    Abnormal Labs Reviewed  COMPREHENSIVE METABOLIC PANEL - Abnormal; Notable for the following components:      Result Value   Glucose, Bld 162 (*)    Creatinine, Ser 1.31 (*)    GFR calc non Af Amer 42 (*)    GFR calc Af Amer 48 (*)    All other components within normal limits  CBC - Abnormal; Notable for the following components:   WBC 12.1 (*)    All other components within normal limits  SALICYLATE LEVEL - Abnormal; Notable for the following components:   Salicylate Lvl <7.0 (*)    All other components within normal limits  ACETAMINOPHEN LEVEL - Abnormal; Notable for the following components:   Acetaminophen (Tylenol), Serum <10 (*)    All other components within normal limits    Creatinine  Date Value Ref Range Status  10/30/2012 1.09 0.60 - 1.30 mg/dL Final  19/50/9326 7.12 0.60 - 1.30 mg/dL Final  45/80/9983 3.82 0.60 - 1.30 mg/dL Final  50/53/9767 3.41 0.60 - 1.30 mg/dL Final   Creatinine, Ser  Date Value Ref Range Status  12/03/2019 1.31 (H) 0.44 - 1.00 mg/dL Final  93/79/0240 9.73 0.44 - 1.00 mg/dL Final  53/29/9242 6.83 0.44 - 1.00 mg/dL Final  41/96/2229 7.98 (H) 0.44 - 1.00 mg/dL Final     Initial evaluation performed by Britni Henderly, PA-C. Plan: Follow-up on pending acetaminophen and salicylate levels.  Pending TTS evaluation.   Patient's labs were reviewed.   She does have a small increase in her creatinine.  We will encourage oral hydration. Mild leukocytosis nonspecific at this time.  Patient medically cleared. Based on elements in the patient's chart and IVC paperwork, patient may not be entirely reliable in recounting her medical history.  Due to the hour at which I took over the patient's chart, the pharmacy tech was unable to verify her medications by calling the listed pharmacies.  At the end of my shift Claudette Stapler, PA-C will follow up on the reconciliation of the patient's home medications and order these  medications. Patient is also awaiting TTS evaluation.  Vitals:   12/03/19 2134 12/03/19 2201 12/04/19 0256  BP:  137/70 (!) 159/86  Pulse:  66 60  Resp:  18 18  Temp:  98.8 F (37.1 C) 97.6 F (36.4 C)  TempSrc:  Oral Oral  SpO2:  93% 94%  Weight: (!) 140 kg    Height: 5' 3.5" (1.613 m)        Anselm Pancoast, PA-C 12/04/19 9211    Glynn Octave, MD 12/04/19 [phone]

## 2019-12-03 NOTE — ED Triage Notes (Signed)
Pt comes in IVC by daughter.

## 2019-12-04 LAB — SARS CORONAVIRUS 2 BY RT PCR (HOSPITAL ORDER, PERFORMED IN ~~LOC~~ HOSPITAL LAB): SARS Coronavirus 2: NEGATIVE

## 2019-12-04 MED ORDER — ROSUVASTATIN CALCIUM 20 MG PO TABS
40.0000 mg | ORAL_TABLET | Freq: Every day | ORAL | Status: DC
  Administered 2019-12-04 – 2019-12-05 (×2): 40 mg via ORAL
  Filled 2019-12-04 (×2): qty 2

## 2019-12-04 MED ORDER — LEVOTHYROXINE SODIUM 50 MCG PO TABS
175.0000 ug | ORAL_TABLET | Freq: Every day | ORAL | Status: DC
  Administered 2019-12-04 – 2019-12-05 (×2): 175 ug via ORAL
  Filled 2019-12-04 (×2): qty 1

## 2019-12-04 NOTE — ED Notes (Signed)
Pt is much calmer now after showering and getting her daily medication.  Sitter remains with patient.

## 2019-12-04 NOTE — ED Notes (Signed)
Called security to wand patient 
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
## 2019-12-04 NOTE — ED Provider Notes (Signed)
Care assumed from [place], [place] at shift change pending TTS evaluation and medication reconciliation. See his note for full HPI.  In short, C68-year-old female with a past medical history significant for anxiety, bipolar, bradycardia, depression, diabetes, hypothyroidism who presents to the ED under IVC.  Patient IVC due to bipolar, manic, depressive behaviors.  At shift change, patient is awaiting TTS evaluation for this meant and medication reconciliation.  Will order home meds once medication reconciliation has been performed.  8:01 AM reassessed patient at bedside.  Patient denies any physical complaints.  She is tearful on exam because she is upset about how her sister is treating her father.   2:40 PM [facility] recommends overnight observation and reassessment in the morning. Patient placed in psych hold. Home medications ordered.   The patient has been placed in psychiatric observation due to the need to provide a safe environment for the patient while obtaining psychiatric consultation and evaluation, as well as ongoing medical and medication management to treat the patient's condition.  The patient has been placed under full IVC at this time.      Jesusita Oka 12/04/19 1441    Little, Ambrose Finland, MD 12/04/19 1459

## 2019-12-04 NOTE — ED Notes (Signed)
This patient arrived to unit agitated and loud.  She is cursing and screaming.  Sitter is with patient.
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
## 2019-12-04 NOTE — ED Notes (Addendum)
Pt belongings collected and placed in belongings bag, including shirt, shoes, shorts, jewelry (rings, bracelets, necklace), phone. Labeled and placed in pt belongings cabinet in triage. Triage RN advised. [employer]

## 2019-12-04 NOTE — BH Assessment (Signed)
[facility] Assessment Progress Note  Per Shuvon Rankin, FNP, this pt is to remain at [facility] overnight for further observation and stabilization.  Pt presents under IVC initiated by pt's sister and upheld by EDP Cherlynn Perches.  Pt's nurse has been notified.  Doylene Canning, [place] Behavioral Health Coordinator [phone]

## 2019-12-04 NOTE — BH Assessment (Signed)
Case was staffed with Rankin NP who recommended patient be observed and monitored for 24 hours.

## 2019-12-05 ENCOUNTER — Encounter (HOSPITAL_COMMUNITY): Payer: Self-pay | Admitting: Registered Nurse

## 2019-12-05 DIAGNOSIS — Z638 Other specified problems related to primary support group: Secondary | ICD-10-CM

## 2019-12-05 MED ORDER — TRAZODONE HCL 50 MG PO TABS: 50.0000 mg | ORAL_TABLET | Freq: Every evening | ORAL | Status: DC | PRN

## 2019-12-05 MED ORDER — RISPERIDONE 2 MG PO TABS: 6.0000 mg | ORAL_TABLET | Freq: Every day | ORAL | Status: DC
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
## 2019-12-05 NOTE — Social Work (Signed)
Consult request has been received. CSW attempting to follow up at present time.   CSW will continue to follow for dc needs for transportation.  Ryon Layton Tarpley-Carter, MSW, LCSW-A [facility] ED Transitions of Education administrator Health [phone]

## 2019-12-05 NOTE — Discharge Instructions (Addendum)
For your behavioral health needs, you are advised to follow up with your current outpatient provider. °
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
## 2019-12-05 NOTE — ED Notes (Signed)
Pt DCd off unit to home per provider. Pt alert, cooperative, no s/s of distress. DC information given to pt. Belongings given to pt. Pt ambulatory, pt off unit in w/c, escorted by [facility]. Pt transported by [employer].

## 2019-12-05 NOTE — Social Work (Addendum)
CSW provided pt with Therapist, nutritional, pt signed Therapist, nutritional, and CSW faxed [employer] to transport.  CSW obtained safe transport for pt to her home.  Please reconsult if new social work issues arise, CSW signing off.  Yesena Reaves Tarpley-Carter, MSW, LCSW-A [facility] ED Transitions of Education administrator Health [phone]

## 2020-02-07 ENCOUNTER — Ambulatory Visit: Payer: Medicare HMO | Admitting: Dermatology

## 2020-03-06 ENCOUNTER — Encounter: Payer: Self-pay | Admitting: Psychiatry

## 2020-08-19 DIAGNOSIS — E039 Hypothyroidism, unspecified: Secondary | ICD-10-CM | POA: Diagnosis not present

## 2020-08-19 DIAGNOSIS — E781 Pure hyperglyceridemia: Secondary | ICD-10-CM | POA: Diagnosis not present

## 2020-08-19 DIAGNOSIS — E119 Type 2 diabetes mellitus without complications: Secondary | ICD-10-CM | POA: Diagnosis not present

## 2020-08-19 DIAGNOSIS — E782 Mixed hyperlipidemia: Secondary | ICD-10-CM | POA: Diagnosis not present

## 2020-08-19 DIAGNOSIS — I10 Essential (primary) hypertension: Secondary | ICD-10-CM | POA: Diagnosis not present

## 2020-08-20 ENCOUNTER — Other Ambulatory Visit: Payer: Self-pay | Admitting: Internal Medicine

## 2020-08-20 DIAGNOSIS — Z1231 Encounter for screening mammogram for malignant neoplasm of breast: Secondary | ICD-10-CM

## 2020-09-13 ENCOUNTER — Emergency Department
Admission: EM | Admit: 2020-09-13 | Discharge: 2020-09-13 | Disposition: A | Discharge status: Home / Self Care | Payer: Medicare Other | Attending: Emergency Medicine | Admitting: Emergency Medicine

## 2020-09-13 ENCOUNTER — Ambulatory Visit (HOSPITAL_COMMUNITY)
Admission: RE | Admit: 2020-09-13 | Discharge: 2020-09-13 | Disposition: A | Discharge status: Home / Self Care | Payer: Medicare Other | Attending: Psychiatry | Admitting: Psychiatry

## 2020-09-13 ENCOUNTER — Other Ambulatory Visit: Payer: Self-pay

## 2020-09-13 DIAGNOSIS — J449 Chronic obstructive pulmonary disease, unspecified: Secondary | ICD-10-CM | POA: Diagnosis not present

## 2020-09-13 DIAGNOSIS — F332 Major depressive disorder, recurrent severe without psychotic features: Secondary | ICD-10-CM | POA: Insufficient documentation

## 2020-09-13 DIAGNOSIS — Z20822 Contact with and (suspected) exposure to covid-19: Secondary | ICD-10-CM | POA: Diagnosis not present

## 2020-09-13 DIAGNOSIS — F411 Generalized anxiety disorder: Secondary | ICD-10-CM | POA: Insufficient documentation

## 2020-09-13 DIAGNOSIS — F419 Anxiety disorder, unspecified: Secondary | ICD-10-CM

## 2020-09-13 DIAGNOSIS — E039 Hypothyroidism, unspecified: Secondary | ICD-10-CM | POA: Diagnosis not present

## 2020-09-13 DIAGNOSIS — Z87891 Personal history of nicotine dependence: Secondary | ICD-10-CM | POA: Insufficient documentation

## 2020-09-13 DIAGNOSIS — F4322 Adjustment disorder with anxiety: Secondary | ICD-10-CM | POA: Diagnosis present

## 2020-09-13 DIAGNOSIS — Z7984 Long term (current) use of oral hypoglycemic drugs: Secondary | ICD-10-CM | POA: Insufficient documentation

## 2020-09-13 DIAGNOSIS — R197 Diarrhea, unspecified: Secondary | ICD-10-CM | POA: Diagnosis not present

## 2020-09-13 DIAGNOSIS — Z79899 Other long term (current) drug therapy: Secondary | ICD-10-CM | POA: Insufficient documentation

## 2020-09-13 DIAGNOSIS — E119 Type 2 diabetes mellitus without complications: Secondary | ICD-10-CM | POA: Diagnosis not present

## 2020-09-13 LAB — RESP PANEL BY RT-PCR (FLU A&B, COVID) ARPGX2
Influenza A by PCR: NEGATIVE
Influenza B by PCR: NEGATIVE
SARS Coronavirus 2 by RT PCR: NEGATIVE

## 2020-09-13 NOTE — ED Triage Notes (Signed)
Pt to ED POV for anxiety x2 weeks, states she "can't take it anymore" and it is keeping her from sleeping.  Denies SI/HI  Thinks she is depressed

## 2020-09-13 NOTE — BH Assessment (Signed)
Comprehensive Clinical Assessment (CCA) Note  09/13/2020 Julie Morrow [id number]  Disposition: Shaune Pollack DNP recommends patient be discharged and follow up with current provider.   Flowsheet Row [facility] Most recent reading at 09/13/2020  6:07 PM ED from 09/13/2020 in [facility] EM[facility]GENCY DEPARTMENT Most recent reading at 09/13/2020  1:56 PM ED from 12/03/2019 in [facility] Most recent reading at 12/03/2019  9:36 PM  C-SSRS RISK CATEGORY No Risk No Risk No Risk    The patient demonstrates the following risk factors for suicide: Chronic risk factors for suicide include: N/A. Acute risk factors for suicide include: N/A. Protective factors for this patient include: coping skills. Considering these factors, the overall suicide risk at this point appears to be low. Patient is appropriate for outpatient follow up.  Patient presents this date voluntary as a walk in requesting assistance with ongoing anxiety. Patient denies any S/I, H/I or AVH. Patient has a past medical history of bipolar disorder, anxiety and depression. Patient currently receives OP services from [facility] who assists with medication management for symptom management. Patient denies any SA issues. Patient was last seen on 12/04/19 when she presented with similar symptoms. Patient denies access to firearms or current legal charges. Patient denies any history of abuse. Patient states this date that she has been taking her medications although has not been sleeping the last two weeks reporting 5 to 6 hours a night which "makes her feel jumpy when she wakes up." Patient reports she feels her depression/anxiety medications are working as indicated and "just needs some sleep." Patient contacts for safety and is requesting to be evlauted for medication management for ongoing sleep issues.  Lord DNP evlauted patient and  writes: Julie Morrow is an 70 y.o. female. Presents with anxiety and depression.  She states she is anxious and it prevents her from sleeping, no panic attacks.  Depression is low to moderate, no suicidal ideations.  No homicidal ideations, hallucinations, or mania noted.  Appetite is "good". She was at [facility] earlier for anxiety and provided follow up at [facility].  She got a ride who she had bring her here.  She wants medications and will call in a prescription for hydroxyzine 10 mg TID PRN and 30 mg at bedtime for sleep with follow up on Monday with [facility].  Patient has contacted her sister [name] [phone] to assist with transportation back to her residence..   Patient is oriented this date x 4 and presents with a pleasant affect. Patient is cooperative and memory appears to be intact with thoughts organized. Patient does not appear to be responding to internal stimuli.        Chief Complaint:  Chief Complaint  Patient presents with  . Psychiatric Evaluation   Visit Diagnosis: MDD recurrent without psychotic features, severe, GAD     CCA Screening, Triage and Referral (STR)  Patient Reported Information How did you hear about [place]? Self  Referral name: No data recorded Referral phone number: No data recorded  Whom do you see for routine medical problems? I don't have a doctor  Practice/Facility Name: No data recorded Practice/Facility Phone Number: No data recorded Name of Contact: No data recorded Contact Number: No data recorded Contact Fax Number: No data recorded Prescriber Name: No data recorded Prescriber Address (if known): No data recorded  What Is the Reason for Your Visit/Call Today? Ongoing anxiety  How Long Has  This Been Causing You Problems? 1 wk - 1 month  What Do You Feel Would Help You the Most Today? Stress Management; Medication(s)   Have You Recently Been in Any Inpatient Treatment (Hospital/Detox/Crisis Center/28-Day Program)?  No  Name/Location of Program/Hospital:No data recorded How Long Were You There? No data recorded When Were You Discharged? No data recorded  Have You Ever Received Services From [facility] Before? No  Who Do You [facility]? Pt has been assessed in the past   Have You Recently Had Any Thoughts About Hurting Yourself? No  Are You Planning to Commit Suicide/Harm Yourself At This time? No   Have you Recently Had Thoughts About Hurting Someone Karolee Ohslse? No  Explanation: Pt reports she intends to harm sister   Have You Used Any Alcohol or Drugs in the Past 24 Hours? No  How Long Ago Did You Use Drugs or Alcohol? No data recorded What Did You Use and How Much? No data recorded  Do You Currently Have a Therapist/Psychiatrist? No  Name of Therapist/Psychiatrist: No data recorded  Have You Been Recently Discharged From Any Office Practice or Programs? No  Explanation of Discharge From Practice/Program: No data recorded    CCA Screening Triage Referral Assessment Type of Contact: Face-to-Face  Is this Initial or Reassessment? No data recorded Date Telepsych consult ordered in [facility]:  No data recorded Time Telepsych consult ordered in [facility]:  No data recorded  Patient Reported Information Reviewed? Yes  Patient Left Without Being Seen? No data recorded Reason for Not Completing Assessment: No data recorded  Collateral Involvement: Contacted sister Beckie BusingBillie Allen [phone]   Does Patient Have a Court Appointed Legal Guardian? No data recorded Name and Contact of Legal Guardian: No data recorded If Minor and Not Living with Parent(s), Who has Custody? No data recorded Is CPS involved or ever been involved? Never  Is APS involved or ever been involved? Never   Patient Determined To Be At Risk for Harm To Self or Others Based on Review of Patient Reported Information or Presenting Complaint? No  Method: No Plan  Availability of Means: No access or NA  Intent: Vague  intent or NA  Notification Required: Identifiable person is aware  Additional Information for Danger to Others Potential: Previous attempts  Additional Comments for Danger to Others Potential: NA  Are There Guns or Other Weapons in Your Home? No  Types of Guns/Weapons: No data recorded Are These Weapons Safely Secured?                            No data recorded Who Could Verify You Are Able To Have These Secured: No data recorded Do You Have any Outstanding Charges, Pending Court Dates, Parole/Probation? No  Contacted To Inform of Risk of Harm To Self or Others: -- (NA)   Location of Assessment: -- [facility])   Does Patient Present under Involuntary Commitment? No  IVC Papers Initial File Date: 12/03/2019   [place]   Patient Currently Receiving the Following Services: Not Receiving Services   Determination of Need: Routine (7 days)   Options For Referral: Outpatient Therapy     CCA Biopsychosocial Intake/Chief Complaint:  No data recorded Current Symptoms/Problems: No data recorded  Patient Reported Schizophrenia/Schizoaffective Diagnosis in Past: No data recorded  Strengths: No data recorded Preferences: No data recorded Abilities: No data recorded  Type of Services Patient Feels are Needed: No data recorded  Initial Clinical  Notes/Concerns: No data recorded  Mental Health Symptoms Depression:  No data recorded  Duration of Depressive symptoms: No data recorded  Mania:  No data recorded  Anxiety:   No data recorded  Psychosis:  No data recorded  Duration of Psychotic symptoms: No data recorded  Trauma:  No data recorded  Obsessions:  No data recorded  Compulsions:  No data recorded  Inattention:  No data recorded  Hyperactivity/Impulsivity:  No data recorded  Oppositional/Defiant Behaviors:  No data recorded  Emotional Irregularity:  No data recorded  Other Mood/Personality Symptoms:  No data recorded   Mental Status  Exam Appearance and self-care  Stature:  No data recorded  Weight:  No data recorded  Clothing:  No data recorded  Grooming:  No data recorded  Cosmetic use:  No data recorded  Posture/gait:  No data recorded  Motor activity:  No data recorded  Sensorium  Attention:  No data recorded  Concentration:  No data recorded  Orientation:  No data recorded  Recall/memory:  No data recorded  Affect and Mood  Affect:  No data recorded  Mood:  No data recorded  Relating  Eye contact:  No data recorded  Facial expression:  No data recorded  Attitude toward examiner:  No data recorded  Thought and Language  Speech flow: No data recorded  Thought content:  No data recorded  Preoccupation:  No data recorded  Hallucinations:  No data recorded  Organization:  No data recorded  [employer] of Knowledge:  No data recorded  Intelligence:  No data recorded  Abstraction:  No data recorded  Judgement:  No data recorded  Reality Testing:  No data recorded  Insight:  No data recorded  Decision Making:  No data recorded  Social Functioning  Social Maturity:  No data recorded  Social Judgement:  No data recorded  Stress  Stressors:  No data recorded  Coping Ability:  No data recorded  Skill Deficits:  No data recorded  Supports:  No data recorded    Religion:    Leisure/Recreation:    Exercise/Diet:     CCA Employment/Education Employment/Work Situation:    Education:     CCA Family/Childhood History Family and Relationship History:    Childhood History:     Child/Adolescent Assessment:     CCA Substance Use Alcohol/Drug Use:                           ASAM's:  Six Dimensions of Multidimensional Assessment  Dimension 1:  Acute Intoxication and/or Withdrawal Potential:      Dimension 2:  Biomedical Conditions and Complications:      Dimension 3:  Emotional, Behavioral, or Cognitive Conditions and Complications:     Dimension 4:   Readiness to Change:     Dimension 5:  Relapse, Continued use, or Continued Problem Potential:     Dimension 6:  Recovery/Living Environment:     ASAM Severity Score:    ASAM Recommended Level of Treatment:     Substance use Disorder (SUD)    Recommendations for Services/Supports/Treatments:    DSM5 Diagnoses: Patient Active Problem List   Diagnosis Date Noted  . Adjustment disorder with anxiety 09/13/2020  . Family discord 12/05/2019  . AMS (altered mental status) 05/14/2019  . Acute lower UTI 05/14/2019  . Adjustment disorder with mixed disturbance of emotions and conduct   . Bipolar I disorder with mania (HCC) 12/27/2018  . PAD (peripheral artery  disease) (HCC) 10/16/2018  . GAD (generalized anxiety disorder) 03/04/2018  . Insomnia 03/04/2018  . Situational depression 03/04/2018  . Bipolar disorder (HCC) 03/04/2018  . Acute kidney injury (HCC) 05/30/2017  . Bradycardia   . Lithium toxicity   . COPD GOLD 0 / still smoking  12/22/2016  . Generalized weakness 10/27/2015  . GERD (gastroesophageal reflux disease) 10/15/2015  . Bipolar 1 disorder, mixed, severe (HCC) 10/11/2015  . Diabetes type 2, controlled (HCC) 07/07/2015  . Morbid (severe) obesity due to excess calories (HCC) 07/07/2015  . Cigarette smoker 07/07/2015  . Hypothyroidism 07/07/2015  . HLD (hyperlipidemia) 11/07/2014  . Psoriasis 11/07/2014    Patient Centered Plan: Patient is on the following Treatment Plan(s):    Referrals to Alternative Service(s): Referred to Alternative Service(s):   Place:   Date:   Time:    Referred to Alternative Service(s):   Place:   Date:   Time:    Referred to Alternative Service(s):   Place:   Date:   Time:    Referred to Alternative Service(s):   Place:   Date:   Time:     Alfredia Ferguson, LCAS

## 2020-09-13 NOTE — ED Notes (Signed)
Pt asked RN to call Carollee Herter ([phone]) to ask for a ride home. Carollee Herter stated she will be here shortly.

## 2020-09-13 NOTE — ED Notes (Signed)
Pt unable to give urine sample at this time 

## 2020-09-13 NOTE — H&P (Addendum)
Behavioral Health Medical Screening Exam  Julie Morrow is an 70 y.o. female. Presents with anxiety and depression.  She states she is anxious and it prevents her from sleeping, no panic attacks.  Depression is low to moderate, no suicidal ideations.  No homicidal ideations, hallucinations, or mania noted.  Appetite is "good". She was at [facility] earlier for anxiety and provided follow up at [facility].  She got a ride who she had bring her here.  She wants medications and will call in a prescription for hydroxyzine 10 mg TID PRN and 30 mg at bedtime for sleep with follow up on Monday with [facility].  Total Time spent with patient: 30 minutes  Psychiatric Specialty Exam: Physical Exam Vitals and nursing note reviewed.  Constitutional:      Appearance: Normal appearance.  HENT:     Head: Normocephalic.     Nose: Nose normal.  Pulmonary:     Effort: Pulmonary effort is normal.  Musculoskeletal:        General: Normal range of motion.     Cervical back: Normal range of motion.  Neurological:     General: No focal deficit present.     Mental Status: She is alert and oriented to person, place, and time.  Psychiatric:        Attention and Perception: Attention and perception normal.        Mood and Affect: Mood is anxious and depressed.        Speech: Speech normal.        Behavior: Behavior normal. Behavior is cooperative.        Thought Content: Thought content normal.        Cognition and Memory: Cognition and memory normal.        Judgment: Judgment normal.    Review of Systems  Psychiatric/Behavioral: Positive for dysphoric mood. The patient is nervous/anxious.   All other systems reviewed and are negative.  Blood pressure (!) 121/54, pulse 75, temperature 97.6 F (36.4 C), temperature source Oral, resp. rate 20, SpO2 99 %.There is no height or weight on file to calculate BMI. General Appearance: Casual Eye Contact:  Good Speech:  Normal Rate Volume:  Normal Mood:  Anxious and  Depressed Affect:  Blunt Thought Process:  Coherent and Descriptions of Associations: Intact Orientation:  Full (Time, Place, and Person) Thought Content:  WDL and Logical Suicidal Thoughts:  No Homicidal Thoughts:  No Memory:  Immediate;   Good Recent;   Good Remote;   Good Judgement:  Fair Insight:  Fair Psychomotor Activity:  Normal Concentration: Concentration: Good and Attention Span: Good Recall:  Good Fund of Knowledge:Fair Language: Good Akathisia:  No Handed:  Right AIMS (if indicated):    Assets:  Housing Leisure Time Physical Health Resilience Social Support Sleep:     Musculoskeletal: Strength & Muscle Tone: within normal limits Gait & Station: normal Patient leans: N/A  Blood pressure (!) 121/54, pulse 75, temperature 97.6 F (36.4 C), temperature source Oral, resp. rate 20, SpO2 99 %.  Recommendations: Based on my evaluation the patient does not appear to have an emergency medical condition.  Nanine Means, NP 09/13/2020, 5:09 PM

## 2020-09-13 NOTE — ED Notes (Signed)
Pt discharged home. VS stable.  Pt denies SI.  Pt given information on [facility] to follow up with complaint of anxiety.  Discharge instructions reviewed with patient. Pt to be given a ride home by a friend.

## 2020-09-13 NOTE — ED Provider Notes (Signed)
[facility] Emergency Department Provider Note  ____________________________________________   Event Date/Time   First MD Initiated Contact with Patient 09/13/20 1403     (approximate)  I have reviewed the triage vital signs and the nursing notes.   HISTORY  Chief Complaint Anxiety   HPI Julie Morrow is a 70 y.o. female with a past medical history of bipolar disorder, anxiety, depression, DM, hypothyroidism who presents for assessment of some worsening [name] over the last couple of weeks.  Patient states he does have a little diarrhea today.  She denies any SI, HI or hallucinations.  Denies any fevers, chills, headache, earache, sore throat, nausea, vomiting, chest pain, cough, shortness of breath, rash, recent traumatic injuries or falls or any other acute pain.  States she has been sleeping poorly over the last 2 weeks has not been continuously awake over this time.  States she is just feeling very anxious wants to get some treatment for that.         Past Medical History:  Diagnosis Date  . Anxiety   . Bipolar 1 disorder (HCC)   . Bradycardia   . Depression   . Diabetes mellitus, type II (HCC)    Patient takes Glucotrol and Januvia  . Hypothyroidism   . Lithium toxicity     Patient Active Problem List   Diagnosis Date Noted  . Family discord 12/05/2019  . AMS (altered mental status) 05/14/2019  . Acute lower UTI 05/14/2019  . Adjustment disorder with mixed disturbance of emotions and conduct   . Bipolar I disorder with mania (HCC) 12/27/2018  . PAD (peripheral artery disease) (HCC) 10/16/2018  . GAD (generalized anxiety disorder) 03/04/2018  . Insomnia 03/04/2018  . Situational depression 03/04/2018  . Bipolar disorder (HCC) 03/04/2018  . Acute kidney injury (HCC) 05/30/2017  . Bradycardia   . Lithium toxicity   . COPD GOLD 0 / still smoking  12/22/2016  . Generalized weakness 10/27/2015  . GERD (gastroesophageal reflux disease)  10/15/2015  . Bipolar 1 disorder, mixed, severe (HCC) 10/11/2015  . Diabetes type 2, controlled (HCC) 07/07/2015  . Morbid (severe) obesity due to excess calories (HCC) 07/07/2015  . Cigarette smoker 07/07/2015  . Hypothyroidism 07/07/2015  . HLD (hyperlipidemia) 11/07/2014  . Psoriasis 11/07/2014    Past Surgical History:  Procedure Laterality Date  . ABDOMINAL HYSTERECTOMY    . CHOLECYSTECTOMY      Prior to Admission medications   Medication Sig Start Date End Date Taking? Authorizing Provider  aspirin 81 MG chewable tablet Chew 1 tablet (81 mg total) by mouth daily. Patient not taking: Reported on 12/04/2019 01/05/19   Clapacs, Jackquline Denmark, MD  CINNAMON PO Take 1 tablet by mouth daily.    [provider]  divalproex (DEPAKOTE ER) 250 MG 24 hr tablet Take 750 mg by mouth at bedtime. 10/01/19   [provider]  fenofibrate 160 MG tablet Take 1 tablet (160 mg total) by mouth daily with breakfast. Patient not taking: Reported on 12/04/2019 01/05/19   Clapacs, Jackquline Denmark, MD  FEROSUL 325 (65 Fe) MG tablet Take 325 mg by mouth daily. 10/01/19   [provider]  levothyroxine (SYNTHROID) 175 MCG tablet Take 175 mcg by mouth daily. 11/02/19   [provider]  levothyroxine (SYNTHROID) 200 MCG tablet Take 1 tablet (200 mcg total) by mouth daily before breakfast. Patient not taking: Reported on 12/04/2019 01/05/19   Clapacs, Jackquline Denmark, MD  loratadine (CLARITIN) 10 MG tablet Take 1 tablet (10  mg total) by mouth daily. Patient not taking: Reported on 12/04/2019 01/05/19   Clapacs, Jackquline Denmark, MD  melatonin 5 MG TABS Take 5-10 mg by mouth at bedtime.    [provider]  metFORMIN (GLUCOPHAGE) 500 MG tablet Take 1 tablet (500 mg total) by mouth daily with breakfast. Patient not taking: Reported on 12/04/2019 01/05/19   Clapacs, Jackquline Denmark, MD  Multiple Vitamins-Minerals (CENTRUM) tablet Take 1 tablet by mouth daily.    [provider]  polyvinyl alcohol (LIQUIFILM TEARS)  1.4 % ophthalmic solution Place 1 drop into both eyes as needed for dry eyes.    [provider]  Probiotic Product (PROBIOTIC PO) Take 1 capsule by mouth daily.    [provider]  risperiDONE (RISPERDAL) 3 MG tablet Take 2 tablets (6 mg total) by mouth at bedtime. Patient not taking: Reported on 12/04/2019 01/05/19   Clapacs, Jackquline Denmark, MD  rosuvastatin (CRESTOR) 40 MG tablet Take 40 mg by mouth daily. 12/31/18   [provider]  traZODone (DESYREL) 50 MG tablet Take 50 mg by mouth at bedtime as needed for sleep. 10/24/19   [provider]    Allergies Patient has no known allergies.  Family History  Problem Relation Age of Onset  . Dementia Mother   . Arthritis Father     Social History Social History   Tobacco Use  . Smoking status: Former Smoker    Packs/day: 0.50    Years: 20.00    Pack years: 10.00    Types: Cigarettes    Start date: 11/06/1984  . Smokeless tobacco: Never Used  Vaping Use  . Vaping Use: Never used  Substance Use Topics  . Alcohol use: No    Alcohol/week: 0.0 standard drinks  . Drug use: No    Review of Systems  Review of Systems  Constitutional: Negative for chills and fever.  HENT: Negative for sore throat.   Eyes: Negative for pain.  Respiratory: Negative for cough and stridor.   Cardiovascular: Negative for chest pain.  Gastrointestinal: Positive for diarrhea. Negative for vomiting.  Genitourinary: Negative for dysuria.  Musculoskeletal: Negative for myalgias.  Skin: Negative for rash.  Neurological: Negative for seizures, loss of consciousness and headaches.  Endo/Heme/Allergies: Negative for polydipsia.  Psychiatric/Behavioral: Negative for suicidal ideas. The patient is nervous/anxious and has insomnia.   All other systems reviewed and are negative.     ____________________________________________   PHYSICAL EXAM:  VITAL SIGNS: ED Triage Vitals  Enc Vitals Group     BP 09/13/20 1352 124/66      Pulse Rate 09/13/20 1352 72     Resp 09/13/20 1352 20     Temp 09/13/20 1352 98 F (36.7 C)     Temp Source 09/13/20 1352 Oral     SpO2 09/13/20 1352 100 %     Weight 09/13/20 1353 300 lb (136.1 kg)     Height 09/13/20 1353 5\' 3"  (1.6 m)     Head Circumference --      Peak Flow --      Pain Score 09/13/20 1353 0     Pain Loc --      Pain Edu? --      Excl. in GC? --    Vitals:   09/13/20 1352  BP: 124/66  Pulse: 72  Resp: 20  Temp: 98 F (36.7 C)  SpO2: 100%   Physical Exam Vitals and nursing note reviewed.  Constitutional:      General: She is not in  acute distress.    Appearance: She is well-developed. She is obese.  HENT:     Head: Normocephalic and atraumatic.     Right Ear: External ear normal.     Left Ear: External ear normal.     Nose: Nose normal.     Mouth/Throat:     Mouth: Mucous membranes are moist.  Eyes:     Conjunctiva/sclera: Conjunctivae normal.  Cardiovascular:     Rate and Rhythm: Normal rate and regular rhythm.     Heart sounds: No murmur heard.   Pulmonary:     Effort: Pulmonary effort is normal. No respiratory distress.     Breath sounds: Normal breath sounds.  Abdominal:     Palpations: Abdomen is soft.     Tenderness: There is no abdominal tenderness.  Musculoskeletal:        General: No deformity.     Cervical back: Neck supple. No rigidity.  Skin:    General: Skin is warm and dry.     Capillary Refill: Capillary refill takes less than 2 seconds.  Neurological:     Mental Status: She is alert and oriented to person, place, and time.  Psychiatric:        Mood and Affect: Mood is anxious.        Thought Content: Thought content does not include homicidal or suicidal ideation.      ____________________________________________   LABS (all labs ordered are listed, but only abnormal results are displayed)  Labs Reviewed  RESP PANEL BY RT-PCR (FLU A&B, COVID) ARPGX2  URINALYSIS, COMPLETE (UACMP) WITH MICROSCOPIC    ____________________________________________  EKG  ____________________________________________  RADIOLOGY  ED MD interpretation:    Official radiology report(s): No results found.  ____________________________________________   PROCEDURES  Procedure(s) performed (including Critical Care):  Procedures   ____________________________________________   INITIAL IMPRESSION / ASSESSMENT AND PLAN / ED COURSE      Patient presents with above to history exam requesting some treatment of anxiety.  States she is compliant with all her other medications and is afebrile hemodynamically stable on arrival.  She does not appear psychotic or delusional she is not homicidal or suicidal.  She does not appear intoxicated.  With regard to her anxiety I think she is safe for outpatient [facility] follow-up.  Do not believe she requires IVC or emergent psych eval at this time.  Has regard to her area of complaint suspect likely infectious enteritis.  She denies any urinary symptoms to suggest cystitis.  She has no fever tenderness or other symptoms to suggest appendicitis, diverticulitis, cholecystitis or any other emergent life-threatening pathology at this time.  She is drinking soda on reassessment and states she feels "good".  Given stable vitals with otherwise reassuring exam I think she is safe for discharge with outpatient PCP and [facility] follow-up.  Will swab for influenza and advised patient she can follow this with little blood lines.  Discharged stable condition.  Strict return precautions advised and discussed        ____________________________________________   FINAL CLINICAL IMPRESSION(S) / ED DIAGNOSES  Final diagnoses:  Anxiety  Diarrhea, unspecified type    Medications - No data to display   ED Discharge Orders    None       Note:  This document was prepared using Dragon voice recognition software and may include unintentional dictation errors.   Gilles Chiquito,  MD 09/13/20 [phone]

## 2020-09-16 ENCOUNTER — Emergency Department
Admission: EM | Admit: 2020-09-16 | Discharge: 2020-09-19 | Disposition: A | Discharge status: Home / Self Care | Payer: Medicare Other | Attending: Emergency Medicine | Admitting: Emergency Medicine

## 2020-09-16 ENCOUNTER — Other Ambulatory Visit: Payer: Self-pay

## 2020-09-16 DIAGNOSIS — Z7982 Long term (current) use of aspirin: Secondary | ICD-10-CM | POA: Diagnosis not present

## 2020-09-16 DIAGNOSIS — F17210 Nicotine dependence, cigarettes, uncomplicated: Secondary | ICD-10-CM | POA: Insufficient documentation

## 2020-09-16 DIAGNOSIS — Z20822 Contact with and (suspected) exposure to covid-19: Secondary | ICD-10-CM | POA: Diagnosis not present

## 2020-09-16 DIAGNOSIS — Z79899 Other long term (current) drug therapy: Secondary | ICD-10-CM | POA: Diagnosis not present

## 2020-09-16 DIAGNOSIS — E119 Type 2 diabetes mellitus without complications: Secondary | ICD-10-CM | POA: Diagnosis not present

## 2020-09-16 DIAGNOSIS — Z7984 Long term (current) use of oral hypoglycemic drugs: Secondary | ICD-10-CM | POA: Diagnosis not present

## 2020-09-16 DIAGNOSIS — E039 Hypothyroidism, unspecified: Secondary | ICD-10-CM | POA: Insufficient documentation

## 2020-09-16 DIAGNOSIS — E782 Mixed hyperlipidemia: Secondary | ICD-10-CM | POA: Diagnosis not present

## 2020-09-16 DIAGNOSIS — F3163 Bipolar disorder, current episode mixed, severe, without psychotic features: Secondary | ICD-10-CM | POA: Diagnosis not present

## 2020-09-16 DIAGNOSIS — R45851 Suicidal ideations: Secondary | ICD-10-CM | POA: Diagnosis not present

## 2020-09-16 DIAGNOSIS — Z046 Encounter for general psychiatric examination, requested by authority: Secondary | ICD-10-CM | POA: Diagnosis present

## 2020-09-16 DIAGNOSIS — E785 Hyperlipidemia, unspecified: Secondary | ICD-10-CM | POA: Diagnosis present

## 2020-09-16 DIAGNOSIS — F329 Major depressive disorder, single episode, unspecified: Secondary | ICD-10-CM | POA: Diagnosis not present

## 2020-09-16 LAB — LIPID PANEL
Cholesterol: 106 mg/dL (ref 0–200)
HDL: 28 mg/dL — ABNORMAL LOW (ref >40)
LDL Cholesterol: 15 mg/dL (ref 0–99)
Total CHOL/HDL Ratio: 3.8 RATIO
Triglycerides: 315 mg/dL — ABNORMAL HIGH (ref <150)
VLDL: 63 mg/dL — ABNORMAL HIGH (ref 0–40)

## 2020-09-16 LAB — CBC
HCT: 46.3 % — ABNORMAL HIGH (ref 36.0–46.0)
Hemoglobin: 15.9 g/dL — ABNORMAL HIGH (ref 12.0–15.0)
MCH: 30.9 pg (ref 26.0–34.0)
MCHC: 34.3 g/dL (ref 30.0–36.0)
MCV: 89.9 fL (ref 80.0–100.0)
Platelets: 192 10*3/uL (ref 150–400)
RBC: 5.15 MIL/uL — ABNORMAL HIGH (ref 3.87–5.11)
RDW: 13.9 % (ref 11.5–15.5)
WBC: 11.7 10*3/uL — ABNORMAL HIGH (ref 4.0–10.5)
nRBC: 0 % (ref 0.0–0.2)

## 2020-09-16 LAB — HEMOGLOBIN A1C
Hgb A1c MFr Bld: 6.5 % — ABNORMAL HIGH (ref 4.8–5.6)
Mean Plasma Glucose: 139.85 mg/dL

## 2020-09-16 LAB — COMPREHENSIVE METABOLIC PANEL
ALT: 32 U/L (ref 0–44)
AST: 38 U/L (ref 15–41)
Albumin: 4.4 g/dL (ref 3.5–5.0)
Alkaline Phosphatase: 69 U/L (ref 38–126)
Anion gap: 12 (ref 5–15)
BUN: 13 mg/dL (ref 8–23)
CO2: 24 mmol/L (ref 22–32)
Calcium: 9.8 mg/dL (ref 8.9–10.3)
Chloride: 100 mmol/L (ref 98–111)
Creatinine, Ser: 0.96 mg/dL (ref 0.44–1.00)
GFR, Estimated: >60 mL/min (ref >60)
Glucose, Bld: 159 mg/dL — ABNORMAL HIGH (ref 70–99)
Potassium: 3.5 mmol/L (ref 3.5–5.1)
Sodium: 136 mmol/L (ref 135–145)
Total Bilirubin: 0.7 mg/dL (ref 0.3–1.2)
Total Protein: 7.5 g/dL (ref 6.5–8.1)

## 2020-09-16 LAB — RESP PANEL BY RT-PCR (FLU A&B, COVID) ARPGX2
Influenza A by PCR: NEGATIVE
Influenza B by PCR: NEGATIVE
SARS Coronavirus 2 by RT PCR: NEGATIVE

## 2020-09-16 LAB — ACETAMINOPHEN LEVEL: Acetaminophen (Tylenol), Serum: <10 ug/mL — ABNORMAL LOW (ref 10–30)

## 2020-09-16 LAB — ETHANOL: Alcohol, Ethyl (B): <10 mg/dL (ref <10)

## 2020-09-16 LAB — SALICYLATE LEVEL: Salicylate Lvl: <7 mg/dL — ABNORMAL LOW (ref 7.0–30.0)

## 2020-09-16 LAB — TSH: TSH: 1.116 u[IU]/mL (ref 0.350–4.500)

## 2020-09-16 MED ORDER — LEVOTHYROXINE SODIUM 100 MCG PO TABS
200.0000 ug | ORAL_TABLET | Freq: Every day | ORAL | Status: DC
  Administered 2020-09-17 – 2020-09-19 (×3): 200 ug via ORAL
  Filled 2020-09-16 (×4): qty 2
  Filled 2020-09-16: qty 8

## 2020-09-16 MED ORDER — LORATADINE 10 MG PO TABS
10.0000 mg | ORAL_TABLET | Freq: Every day | ORAL | Status: DC
  Administered 2020-09-16 – 2020-09-19 (×4): 10 mg via ORAL
  Filled 2020-09-16 (×4): qty 1

## 2020-09-16 MED ORDER — GLIPIZIDE 10 MG PO TABS
10.0000 mg | ORAL_TABLET | Freq: Two times a day (BID) | ORAL | Status: DC
  Administered 2020-09-17 – 2020-09-19 (×6): 10 mg via ORAL
  Filled 2020-09-16 (×8): qty 1

## 2020-09-16 MED ORDER — BENZTROPINE MESYLATE 1 MG PO TABS
0.5000 mg | ORAL_TABLET | Freq: Every day | ORAL | Status: DC
  Administered 2020-09-16 – 2020-09-18 (×3): 0.5 mg via ORAL
  Filled 2020-09-16 (×3): qty 1

## 2020-09-16 MED ORDER — ASPIRIN EC 81 MG PO TBEC
81.0000 mg | DELAYED_RELEASE_TABLET | Freq: Every day | ORAL | Status: DC
  Administered 2020-09-16 – 2020-09-19 (×4): 81 mg via ORAL
  Filled 2020-09-16 (×3): qty 1

## 2020-09-16 MED ORDER — FENOFIBRATE 160 MG PO TABS
160.0000 mg | ORAL_TABLET | Freq: Every day | ORAL | Status: DC
  Administered 2020-09-16 – 2020-09-19 (×4): 160 mg via ORAL
  Filled 2020-09-16 (×6): qty 1

## 2020-09-16 MED ORDER — RISPERIDONE 3 MG PO TABS
6.0000 mg | ORAL_TABLET | Freq: Every day | ORAL | Status: DC
  Administered 2020-09-16 – 2020-09-18 (×3): 6 mg via ORAL
  Filled 2020-09-16 (×3): qty 2

## 2020-09-16 MED ORDER — METFORMIN HCL 500 MG PO TABS
500.0000 mg | ORAL_TABLET | Freq: Every day | ORAL | Status: DC
  Administered 2020-09-17 – 2020-09-19 (×3): 500 mg via ORAL
  Filled 2020-09-16 (×3): qty 1

## 2020-09-16 NOTE — ED Notes (Signed)
Report to Amy RN

## 2020-09-16 NOTE — ED Notes (Signed)
Pt provided dinner tray.

## 2020-09-16 NOTE — ED Notes (Signed)
This rn received this pt to [facility].  Pt informed of video cameras and policies in the b[facility].  Pt calm and cooperative.  States I feel bad today and need help. Pt states i'm depressed, not eating or sleeping.

## 2020-09-16 NOTE — ED Notes (Signed)
meds given  Pt resting quietly

## 2020-09-16 NOTE — ED Notes (Signed)
VOL, pend psych consult 
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
## 2020-09-16 NOTE — ED Notes (Signed)
2 yellow necklaces, 2 silver necklaces, 2 yellow rings, 2 silver rings, one shgirt, one bra, one pair of black pants, one pair of white grey socks, one pair of brooks sneakers, one light blue hat.no underwear

## 2020-09-16 NOTE — ED Notes (Signed)
Pt sleeping. 

## 2020-09-16 NOTE — Consult Note (Signed)
[facility] Face-to-Face Psychiatry Consult   Reason for Consult: Consult for 70 year old woman with history of bipolar disorder who comes to the emergency room with suicidal ideation Referring Physician: Vicente Males Patient Identification: Julie Morrow MRN:  [id number] Principal Diagnosis: Bipolar 1 disorder, mixed, severe (HCC) Diagnosis:  Principal Problem:   Bipolar 1 disorder, mixed, severe (HCC) Active Problems:   HLD (hyperlipidemia)   Diabetes type 2, controlled (HCC)   Hypothyroidism   Total Time spent with patient: 1 hour  Subjective:   Julie Morrow is a 70 y.o. female patient admitted with patient seen and chart reviewed.  Patient comes in stating "I am so suicidal".  HPI: 70 year old woman with a long history of bipolar disorder comes to the emergency room stating she is severely suicidal and depressed.  She described herself as extremely depressed.  Claims she has not been sleeping or eating for 2 weeks.  Has no energy to do anything.  Says multiple times that she is very very suicidal.  Denies any hallucinations.  She is not currently getting any outpatient psychiatric treatment.  Denies any drug or alcohol use.  Patient claims that the current symptoms have been present for months but that she has been off medicine for for 3 to 4 years which is demonstrated really untrue because she has had hospitalizations within the last year and a half.  I spoke with her sister who reports also that the patient has been complaining of severe depression but also anxiety and has been agitated making the same statements at home but has not acted to harm herself.  Patient is currently living independently no longer with family.  Past Psychiatric History: Past history of bipolar disorder.  Multiple prior hospitalizations.  Multiple medications tried in the past.  Lithium worked well but she had toxicity problems.  She has had lots of threats of suicide some aggression in the past.  Risperdal had been used  several times as a mood stabilizer with good success.  Risk to Self:   Risk to Others:   Prior Inpatient Therapy:   Prior Outpatient Therapy:    Past Medical History:  Past Medical History:  Diagnosis Date  . Anxiety   . Bipolar 1 disorder (HCC)   . Bradycardia   . Depression   . Diabetes mellitus, type II (HCC)    Patient takes Glucotrol and Januvia  . Hypothyroidism   . Lithium toxicity     Past Surgical History:  Procedure Laterality Date  . ABDOMINAL HYSTERECTOMY    . CHOLECYSTECTOMY     Family History:  Family History  Problem Relation Age of Onset  . Dementia Mother   . Arthritis Father    Family Psychiatric  History: None reported Social History:  Social History   Substance and Sexual Activity  Alcohol Use No  . Alcohol/week: 0.0 standard drinks     Social History   Substance and Sexual Activity  Drug Use No    Social History   Socioeconomic History  . Marital status: Single    Spouse name: Not on file  . Number of children: Not on file  . Years of education: Not on file  . Highest education level: Not on file  Occupational History  . Not on file  Tobacco Use  . Smoking status: Former Smoker    Packs/day: 0.50    Years: 20.00    Pack years: 10.00    Types: Cigarettes    Start date: 11/06/1984  . Smokeless tobacco: Never Used  Vaping Use  . Vaping Use: Never used  Substance and Sexual Activity  . Alcohol use: No    Alcohol/week: 0.0 standard drinks  . Drug use: No  . Sexual activity: Never  Other Topics Concern  . Not on file  Social History Narrative  . Not on file   Social Determinants of Health   Financial Resource Strain: Not on file  Food Insecurity: Not on file  Transportation Needs: Not on file  Physical Activity: Not on file  Stress: Not on file  Social Connections: Not on file   Additional Social History:    Allergies:  No Known Allergies  Labs:  Results for orders placed or performed during the hospital encounter  of 09/16/20 (from the past 48 hour(s))  Comprehensive metabolic panel     Status: Abnormal   Collection Time: 09/16/20  4:21 PM  Result Value Ref Range   Sodium 136 135 - 145 mmol/L   Potassium 3.5 3.5 - 5.1 mmol/L   Chloride 100 98 - 111 mmol/L   CO2 24 22 - 32 mmol/L   Glucose, Bld 159 (H) 70 - 99 mg/dL    Comment: Glucose reference range applies only to samples taken after fasting for at least 8 hours.   BUN 13 8 - 23 mg/dL   Creatinine, Ser 8.67 0.44 - 1.00 mg/dL   Calcium 9.8 8.9 - 61.9 mg/dL   Total Protein 7.5 6.5 - 8.1 g/dL   Albumin 4.4 3.5 - 5.0 g/dL   AST 38 15 - 41 U/L   ALT 32 0 - 44 U/L   Alkaline Phosphatase 69 38 - 126 U/L   Total Bilirubin 0.7 0.3 - 1.2 mg/dL   GFR, Estimated >50 >93 mL/min    Comment: (NOTE) Calculated using the CKD-EPI Creatinine Equation (2021)    Anion gap 12 5 - 15    Comment: Performed at [facility], [address] [place], [place] [address]  Ethanol     Status: None   Collection Time: 09/16/20  4:21 PM  Result Value Ref Range   Alcohol, Ethyl (B) <10 <10 mg/dL    Comment: (NOTE) Lowest detectable limit for serum alcohol is 10 mg/dL.  For medical purposes only. Performed at [facility], [address] [place], [place] [address]   Salicylate level     Status: Abnormal   Collection Time: 09/16/20  4:21 PM  Result Value Ref Range   Salicylate Lvl <7.0 (L) 7.0 - 30.0 mg/dL    Comment: Performed at [facility], [address] [place], [place] [address]  Acetaminophen level     Status: Abnormal   Collection Time: 09/16/20  4:21 PM  Result Value Ref Range   Acetaminophen (Tylenol), Serum <10 (L) 10 - 30 ug/mL    Comment: (NOTE) Therapeutic concentrations vary significantly. A range of 10-30 ug/mL  may be an effective concentration for many patients. However, some  are best treated at concentrations outside of this range. Acetaminophen concentrations >150 ug/mL at 4 hours after ingestion  and >50  ug/mL at 12 hours after ingestion are often associated with  toxic reactions.  Performed at [facility], [address] [place], [place] [address]   cbc     Status: Abnormal   Collection Time: 09/16/20  4:21 PM  Result Value Ref Range   WBC 11.7 (H) 4.0 - 10.5 K/uL   RBC 5.15 (H) 3.87 - 5.11 MIL/uL   Hemoglobin 15.9 (H) 12.0 - 15.0 g/dL  HCT 46.3 (H) 36.0 - 46.0 %   MCV 89.9 80.0 - 100.0 fL   MCH 396.0.9 26.0 - 34.0 pg   MCHC 34.3 30.0 - 36.0 g/dL   RDW 13.9 45.4 - 09.8 %   Platelets 192 150 - 400 K/uL   nRBC 0.0 0.0 - 0.2 %    Comment: Performed at [facility], [address], [place], [place] [address]  Resp Panel by RT-PCR (Flu A&B, Covid) Nasopharyngeal Swab     Status: None   Collection Time: 09/16/20  4:45 PM   Specimen: Nasopharyngeal Swab; Nasopharyngeal(NP) swabs in vial transport medium  Result Value Ref Range   SARS Coronavirus 2 by RT PCR NEGATIVE NEGATIVE    Comment: (NOTE) SARS-CoV-2 target nucleic acids are NOT DETECTED.  The SARS-CoV-2 RNA is generally detectable in upper respiratory specimens during the acute phase of infection. The lowest concentration of SARS-CoV-2 viral copies this assay can detect is 138 copies/mL. A negative result does not preclude SARS-Cov-2 infection and should not be used as the sole basis for treatment or other patient management decisions. A negative result may occur with  improper specimen collection/handling, submission of specimen other than nasopharyngeal swab, presence of viral mutation(s) within the areas targeted by this assay, and inadequate number of viral copies(<138 copies/mL). A negative result must be combined with clinical observations, patient history, and epidemiological information. The expected result is Negative.  Fact Sheet for Patients:  BloggerCourse.com  Fact Sheet for Healthcare Providers:  SeriousBroker.it  This test is no t yet  approved or cleared by the [place] FDA and  has been authorized for detection and/or diagnosis of SARS-CoV-2 by FDA under an Emergency Use Authorization (EUA). This EUA will remain  in effect (meaning this test can be used) for the duration of the COVID-19 declaration under Section 564(b)(1) of the Act, 21 U.S.C.section 360bbb-3(b)(1), unless the authorization is terminated  or revoked sooner.       Influenza A by PCR NEGATIVE NEGATIVE   Influenza B by PCR NEGATIVE NEGATIVE    Comment: (NOTE) The Xpert Xpress SARS-CoV-2/FLU/RSV plus assay is intended as an aid in the diagnosis of influenza from Nasopharyngeal swab specimens and should not be used as a sole basis for treatment. Nasal washings and aspirates are unacceptable for Xpert Xpress SARS-CoV-2/FLU/RSV testing.  Fact Sheet for Patients: BloggerCourse.com  Fact Sheet for Healthcare Providers: SeriousBroker.it  This test is not yet approved or cleared by the [place] FDA and has been authorized for detection and/or diagnosis of SARS-CoV-2 by FDA under an Emergency Use Authorization (EUA). This EUA will remain in effect (meaning this test can be used) for the duration of the COVID-19 declaration under Section 564(b)(1) of the Act, 21 U.S.C. section 360bbb-3(b)(1), unless the authorization is terminated or revoked.  Performed at [facility], [address] [place], [place] [address]     Current Facility-Administered Medications  Medication Dose Route Frequency Provider Last Rate Last Admin  . aspirin EC tablet 81 mg  81 mg Oral Daily Zollie Ellery T, MD      . benztropine (COGENTIN) tablet 0.5 mg  0.5 mg Oral QHS Lathon Adan T, MD      . fenofibrate tablet 160 mg  160 mg Oral Daily Lou Loewe T, MD      . Melene Muller ON 09/17/2020] glipiZIDE (GLUCOTROL) tablet 10 mg  10 mg Oral BID AC Hallis Meditz, Jackquline Denmark, MD      . Melene Muller ON 09/17/2020] levothyroxine  (SYNTHROID) tablet  200 mcg  200 mcg Oral Q0600 Georgie Eduardo T, MD      . loratadine (CLARITIN) tablet 10 mg  10 mg Oral Daily Devansh Riese T, MD      . Melene Muller[START ON 09/17/2020] metFORMIN (GLUCOPHAGE) tablet 500 mg  500 mg Oral Q breakfast Satrina Magallanes T, MD      . risperiDONE (RISPERDAL) tablet 6 mg  6 mg Oral QHS Jamari Diana, Jackquline DenmarkJohn T, MD       Current Outpatient Medications  Medication Sig Dispense Refill  . aspirin 81 MG chewable tablet Chew 1 tablet (81 mg total) by mouth daily. (Patient not taking: Reported on 12/04/2019) 30 tablet 1  . CINNAMON PO Take 1 tablet by mouth daily.    . divalproex (DEPAKOTE ER) 250 MG 24 hr tablet Take 750 mg by mouth at bedtime.    . fenofibrate 160 MG tablet Take 1 tablet (160 mg total) by mouth daily with breakfast. (Patient not taking: Reported on 12/04/2019) 30 tablet 1  . FEROSUL 325 (65 Fe) MG tablet Take 325 mg by mouth daily.    Marland Kitchen. levothyroxine (SYNTHROID) 175 MCG tablet Take 175 mcg by mouth daily.    Marland Kitchen. levothyroxine (SYNTHROID) 200 MCG tablet Take 1 tablet (200 mcg total) by mouth daily before breakfast. (Patient not taking: Reported on 12/04/2019) 30 tablet 1  . loratadine (CLARITIN) 10 MG tablet Take 1 tablet (10 mg total) by mouth daily. (Patient not taking: Reported on 12/04/2019) 30 tablet 1  . melatonin 5 MG TABS Take 5-10 mg by mouth at bedtime.    . metFORMIN (GLUCOPHAGE) 500 MG tablet Take 1 tablet (500 mg total) by mouth daily with breakfast. (Patient not taking: Reported on 12/04/2019) 30 tablet 1  . Multiple Vitamins-Minerals (CENTRUM) tablet Take 1 tablet by mouth daily.    . polyvinyl alcohol (LIQUIFILM TEARS) 1.4 % ophthalmic solution Place 1 drop into both eyes as needed for dry eyes.    . Probiotic Product (PROBIOTIC PO) Take 1 capsule by mouth daily.    . risperiDONE (RISPERDAL) 3 MG tablet Take 2 tablets (6 mg total) by mouth at bedtime. (Patient not taking: Reported on 12/04/2019) 60 tablet 1  . rosuvastatin (CRESTOR) 40 MG tablet Take 40 mg by  mouth daily.    . traZODone (DESYREL) 50 MG tablet Take 50 mg by mouth at bedtime as needed for sleep.      Musculoskeletal: Strength & Muscle Tone: within normal limits Gait & Station: normal Patient leans: N/A            Psychiatric Specialty Exam:  Presentation  General Appearance: No data recorded Eye Contact:No data recorded Speech:No data recorded Speech Volume:No data recorded Handedness:No data recorded  Mood and Affect  Mood:No data recorded Affect:No data recorded  Thought Process  Thought Processes:No data recorded Descriptions of Associations:No data recorded Orientation:No data recorded Thought Content:No data recorded History of Schizophrenia/Schizoaffective disorder:No  Duration of Psychotic Symptoms:No data recorded Hallucinations:No data recorded Ideas of Reference:No data recorded Suicidal Thoughts:No data recorded Homicidal Thoughts:No data recorded  Sensorium  Memory:No data recorded Judgment:No data recorded Insight:No data recorded  Executive Functions  Concentration:No data recorded Attention Span:No data recorded Recall:No data recorded Fund of Knowledge:No data recorded Language:No data recorded  Psychomotor Activity  Psychomotor Activity:No data recorded  Assets  Assets:No data recorded  Sleep  Sleep:No data recorded  Physical Exam: Physical Exam Vitals and nursing note reviewed.  Constitutional:      Appearance: Normal appearance.  HENT:  Head: Normocephalic and atraumatic.     Mouth/Throat:     Pharynx: Oropharynx is clear.  Eyes:     Pupils: Pupils are equal, round, and reactive to light.  Cardiovascular:     Rate and Rhythm: Normal rate and regular rhythm.  Pulmonary:     Effort: Pulmonary effort is normal.     Breath sounds: Normal breath sounds.  Abdominal:     General: Abdomen is flat.     Palpations: Abdomen is soft.  Musculoskeletal:        General: Normal range of motion.  Skin:    General:  Skin is warm and dry.  Neurological:     General: No focal deficit present.     Mental Status: She is alert. Mental status is at baseline.  Psychiatric:        Attention and Perception: She is inattentive.        Mood and Affect: Mood is anxious.        Speech: Speech is rapid and pressured and tangential.        Behavior: Behavior is agitated.        Thought Content: Thought content includes suicidal ideation. Thought content does not include suicidal plan.        Cognition and Memory: Cognition is impaired. Memory is impaired.        Judgment: Judgment is impulsive.    Review of Systems  Constitutional: Negative.   HENT: Negative.   Eyes: Negative.   Respiratory: Negative.   Cardiovascular: Negative.   Gastrointestinal: Negative.   Musculoskeletal: Negative.   Skin: Negative.   Neurological: Negative.   Psychiatric/Behavioral: Positive for depression and suicidal ideas. The patient is nervous/anxious and has insomnia.    Blood pressure (!) 128/95, pulse 74, resp. rate 20, height 5\' 3"  (1.6 m), weight 136.1 kg, SpO2 100 %. Body mass index is 53.14 kg/m.  Treatment Plan Summary: Medication management and Plan This is a 70 year old woman with severe complaints of depressed mood mixed with hyper verbality agitation and mood swings irritability.  Not eating and not sleeping well.  She appears to have good insight.  It looks like she was seen in [place] for evaluation just a week or so ago and at that time was not reporting the severity of these symptoms and was turned away without specific treatment.  Patient at this point is reporting symptoms that would be consistent with needing inpatient treatment.  Labs all ordered including TSH hemoglobin A1c and EKG lipid panel.  Restarted medicine for bipolar disorder triglyceride elevation and diabetes.  Contacted TTS and recommended patient be referred out for admission.  Disposition: Recommend psychiatric Inpatient admission when medically  cleared. Supportive therapy provided about ongoing stressors.  [place], MD 09/16/2020 6:49 PM

## 2020-09-16 NOTE — ED Notes (Signed)
Pt assisted to restroom.  

## 2020-09-16 NOTE — ED Triage Notes (Signed)
Pt states that she was dropped off by her sister, states that she is suicidal and has been for the past 2-3 weeks, denies having a plan and states that she takes medication but it isn't helping her

## 2020-09-16 NOTE — BH Assessment (Signed)
Comprehensive Clinical Assessment (CCA) Note  09/16/2020 Julie Morrow [id number]   Julie Morrow, 70 year old female who presents to [facility] ED voluntarily for treatment. Per triage note, Pt states that she was dropped off by her sister, states that she is suicidal and has been for the past 2-3 weeks, denies having a plan and states that she takes medication, but it isn't helping her.   During TTS assessment pt presents alert and oriented x 4, restless but cooperative, and mood-congruent with affect. The pt does not appear to be responding to internal or external stimuli. Neither is the pt presenting with any delusional thinking. Pt verified the information provided to triage RN.   Pt identifies her main complaint to be that she is "very suicidal" and her current medication is not working. Patient reported having symptoms of depression, anxiety, stress, and irritability. Patient lives alone, receives disability and has 1 child. Patient states one of her children passed away 3 years ago. Patient reports she is not eating or sleeping and has lost 16lbs. Pt denies using drugs and alcohol. Pt reports no INPT hx but has a psychiatrist, Dr. Welton Flakes at [facility]. Pt reports no family hx of MH/SA. Pt endorses vague SI but does not have a plan. Patient states she does have access to firearms at her home. " I want to get better. I don't want to feel this way." Patient denies HI/AH/VH. Pt is not able to contract for safety. Pt provided her sister Julie Morrow [phone] as a collateral contact.   Pending provider disposition.     Chief Complaint:  Chief Complaint  Patient presents with  . Suicidal   Visit Diagnosis: Major Depressive Disorder    CCA Screening, Triage and Referral (STR)  Patient Reported Information How did you hear about [place]? Family/Friend  Referral name: No data recorded Referral phone number: No data recorded  Whom do you see for routine medical problems? I don't have  a doctor  Practice/Facility Name: No data recorded Practice/Facility Phone Number: No data recorded Name of Contact: No data recorded Contact Number: No data recorded Contact Fax Number: No data recorded Prescriber Name: No data recorded Prescriber Address (if known): No data recorded  What Is the Reason for Your Visit/Call Today? Patient reports she is "very suicidal".  How Long Has This Been Causing You Problems? 1 wk - 1 month  What Do You Feel Would Help You the Most Today? Medication(s); Stress Management   Have You Recently Been in Any Inpatient Treatment (Hospital/Detox/Crisis Center/28-Day Program)? No  Name/Location of Program/Hospital:No data recorded How Long Were You There? No data recorded When Were You Discharged? No data recorded  Have You Ever Received Services From [facility] Before? Yes  Who Do You See at [facility]? Pt has been assessed in the past   Have You Recently Had Any Thoughts About Hurting Yourself? Yes  Are You Planning to Commit Suicide/Harm Yourself At This time? No   Have you Recently Had Thoughts About Hurting Someone [name]? No  Explanation: Pt reports she intends to harm sister   Have You Used Any Alcohol or Drugs in the Past 24 Hours? No  How Long Ago Did You Use Drugs or Alcohol? No data recorded What Did You Use and How Much? No data recorded  Do You Currently Have a Therapist/Psychiatrist? Yes  Name of Therapist/Psychiatrist: Dr. Welton Flakes   Have You Been Recently Discharged From Any Office Practice or Programs? No  Explanation of Discharge  From Practice/Program: No data recorded    CCA Screening Triage Referral Assessment Type of Contact: Face-to-Face  Is this Initial or Reassessment? No data recorded Date Telepsych consult ordered in [facility]:  No data recorded Time Telepsych consult ordered in [facility]:  No data recorded  Patient Reported Information Reviewed? Yes  Patient Left Without Being Seen? No data recorded Reason for  Not Completing Assessment: No data recorded  Collateral Involvement: (Sister) Julie Morrow- [phone]   Does Patient Have a Court Appointed Legal Guardian? No data recorded Name and Contact of Legal Guardian: No data recorded If Minor and Not Living with Parent(s), Who has Custody? n/a  Is CPS involved or ever been involved? Never  Is APS involved or ever been involved? Never   Patient Determined To Be At Risk for Harm To Self or Others Based on Review of Patient Reported Information or Presenting Complaint? No  Method: No Plan  Availability of Means: No access or NA  Intent: Vague intent or NA  Notification Required: Identifiable person is aware  Additional Information for Danger to Others Potential: Previous attempts  Additional Comments for Danger to Others Potential: NA  Are There Guns or Other Weapons in Your Home? No  Types of Guns/Weapons: No data recorded Are These Weapons Safely Secured?                            No data recorded Who Could Verify You Are Able To Have These Secured: No data recorded Do You Have any Outstanding Charges, Pending Court Dates, Parole/Probation? No  Contacted To Inform of Risk of Harm To Self or Others: -- (NA)   Location of Assessment: [facility] ED   Does Patient Present under Involuntary Commitment? No  IVC Papers Initial File Date: 12/03/2019   [place] of Residence: [place]   Patient Currently Receiving the Following Services: Medication Management; Individual Therapy   Determination of Need: Urgent (48 hours)   Options For Referral: ED Visit; Outpatient Therapy; Medication Management     CCA Biopsychosocial Intake/Chief Complaint:  Patient reports she is "very suicidal".  Current Symptoms/Problems: anxiety, stress, depressed, irritability   Patient Reported Schizophrenia/Schizoaffective Diagnosis in Past: No   Strengths: No data recorded Preferences: No data recorded Abilities: No data recorded  Type of  Services Patient Feels are Needed: No data recorded  Initial Clinical Notes/Concerns: No data recorded  Mental Health Symptoms Depression:  Irritability; Hopelessness; Weight gain/loss; Increase/decrease in appetite; Change in energy/activity; Sleep (too much or little)   Duration of Depressive symptoms: Greater than two weeks   Mania:  None   Anxiety:   Difficulty concentrating; Irritability; Restlessness; Sleep; Worrying   Psychosis:  None   Duration of Psychotic symptoms: No data recorded  Trauma:  None   Obsessions:  None   Compulsions:  None   Inattention:  None   Hyperactivity/Impulsivity:  N/A   Oppositional/Defiant Behaviors:  None   Emotional Irregularity:  Chronic feelings of emptiness   Other Mood/Personality Symptoms:  anxious, irritable    Mental Status Exam Appearance and self-care  Stature:  Average   Weight:  Overweight   Clothing:  No data recorded  Grooming:  Normal   Cosmetic use:  None   Posture/gait:  Normal   Motor activity:  Restless   Sensorium  Attention:  Normal   Concentration:  Anxiety interferes   Orientation:  X5   Recall/memory:  Normal   Affect and Mood  Affect:  Anxious; Depressed  Mood:  Anxious; Depressed; Hopeless   Relating  Eye contact:  Normal   Facial expression:  Anxious; Depressed   Attitude toward examiner:  Cooperative   Thought and Language  Speech flow: Pressured   Thought content:  Appropriate to Mood and Circumstances   Preoccupation:  None   Hallucinations:  None   Organization:  No data recorded  [employer] ServicesExecutive Functions  Fund of Knowledge:  Average   Intelligence:  Average   Abstraction:  Functional   Judgement:  Fair   Dance movement psychotherapisteality Testing:  Realistic   Insight:  Poor   Decision Making:  Confused   Social Functioning  Social Maturity:  Isolates   Social Judgement:  Victimized   Stress  Stressors:  Illness   Coping Ability:  Contractorxhausted   Skill Deficits:  Decision making    Supports:  Family     Religion:    Leisure/Recreation:    Exercise/Diet: Exercise/Diet Do You Exercise?: No Have You Gained or Lost A Significant Amount of Weight in the Past Six Months?: Yes-Lost Number of Pounds Lost?: 16 Do You Follow a Special Diet?: No Do You Have Any Trouble Sleeping?: Yes Explanation of Sleeping Difficulties: Patient reports getting 2 hrs of sleep   CCA Employment/Education Employment/Work Situation: Employment / Work Situation Employment situation: On disability Has patient ever been in the [employer] and Companymilitary?: No  Education:     CCA Family/Childhood History Family and Relationship History: Family history Does patient have children?: Yes How many children?: 1  Childhood History:  Childhood History Does patient have siblings?: Yes  Child/Adolescent Assessment:     CCA Substance Use Alcohol/Drug Use: Alcohol / Drug Use Pain Medications: See MAR Prescriptions: See MAR Over the Counter: See MAR History of alcohol / drug use?: No history of alcohol / drug abuse                         ASAM's:  Six Dimensions of Multidimensional Assessment  Dimension 1:  Acute Intoxication and/or Withdrawal Potential:      Dimension 2:  Biomedical Conditions and Complications:      Dimension 3:  Emotional, Behavioral, or Cognitive Conditions and Complications:     Dimension 4:  Readiness to Change:     Dimension 5:  Relapse, Continued use, or Continued Problem Potential:     Dimension 6:  Recovery/Living Environment:     ASAM Severity Score:    ASAM Recommended Level of Treatment:     Substance use Disorder (SUD)    Recommendations for Services/Supports/Treatments:    DSM5 Diagnoses: Patient Active Problem List   Diagnosis Date Noted  . Adjustment disorder with anxiety 09/13/2020  . Family discord 12/05/2019  . AMS (altered mental status) 05/14/2019  . Acute lower UTI 05/14/2019  . Adjustment disorder with mixed disturbance of  emotions and conduct   . Bipolar I disorder with mania (HCC) 12/27/2018  . PAD (peripheral artery disease) (HCC) 10/16/2018  . GAD (generalized anxiety disorder) 03/04/2018  . Insomnia 03/04/2018  . Situational depression 03/04/2018  . Bipolar disorder (HCC) 03/04/2018  . Acute kidney injury (HCC) 05/30/2017  . Bradycardia   . Lithium toxicity   . COPD GOLD 0 / still smoking  12/22/2016  . Generalized weakness 10/27/2015  . GERD (gastroesophageal reflux disease) 10/15/2015  . Bipolar 1 disorder, mixed, severe (HCC) 10/11/2015  . Diabetes type 2, controlled (HCC) 07/07/2015  . Morbid (severe) obesity due to excess calories (HCC) 07/07/2015  . Cigarette smoker 07/07/2015  .  Hypothyroidism 07/07/2015  . HLD (hyperlipidemia) 11/07/2014  . Psoriasis 11/07/2014    Patient Centered Plan: Patient is on the following Treatment Plan(s):  Anxiety and Depression   Referrals to Alternative Service(s): Referred to Alternative Service(s):   Place:   Date:   Time:    Referred to Alternative Service(s):   Place:   Date:   Time:    Referred to Alternative Service(s):   Place:   Date:   Time:    Referred to Alternative Service(s):   Place:   Date:   Time:     Tashyra Adduci Dierdre Searles, Counselor, LCAS-A

## 2020-09-16 NOTE — BH Assessment (Signed)
Adult MH  Referral information for Psychiatric Hospitalization faxed to:   [facility]     [place] [phone]),   [address] [phone]),   [facility] [phone] -or- [phone]),   [facility] [phone]) [phone]   [facility]arlene Plater [phone]),   [facility] [phone] or [phone])   [facility] [phone] or [phone])   [facility] [phone] or [phone]),   [facility] [phone])

## 2020-09-16 NOTE — ED Notes (Signed)
VOL, pending placement 

## 2020-09-16 NOTE — ED Notes (Signed)
See triage note, pt reports she has been feeling depressed and suicidal for the past couple weeks. When asked about a plan to harm herself pt states she would use a knife or gun but has not started to do anything. Pt very calm and cooperative.

## 2020-09-17 ENCOUNTER — Inpatient Hospital Stay: Admission: RE | Admit: 2020-09-17 | Payer: Medicare Other | Source: Intra-hospital | Admitting: Psychiatry

## 2020-09-17 DIAGNOSIS — F329 Major depressive disorder, single episode, unspecified: Secondary | ICD-10-CM | POA: Diagnosis not present

## 2020-09-17 DIAGNOSIS — F3163 Bipolar disorder, current episode mixed, severe, without psychotic features: Secondary | ICD-10-CM | POA: Diagnosis not present

## 2020-09-17 MED ORDER — LOPERAMIDE HCL 2 MG PO CAPS
4.0000 mg | ORAL_CAPSULE | Freq: Once | ORAL | Status: AC
  Administered 2020-09-17: 4 mg via ORAL
  Filled 2020-09-17: qty 2

## 2020-09-17 MED ORDER — ACETAMINOPHEN 500 MG PO TABS
1000.0000 mg | ORAL_TABLET | Freq: Once | ORAL | Status: AC
  Administered 2020-09-17: 1000 mg via ORAL
  Filled 2020-09-17: qty 2

## 2020-09-17 NOTE — Consult Note (Signed)
[facility] Face-to-Face Psychiatry Consult   Reason for Consult: Follow-up consult for 70 year old woman with bipolar disorder with mixed presentation Referring Physician: Paduchowski Patient Identification: Julie Morrow MRN:  [id number] Principal Diagnosis: Bipolar 1 disorder, mixed, severe (HCC) Diagnosis:  Principal Problem:   Bipolar 1 disorder, mixed, severe (HCC) Active Problems:   HLD (hyperlipidemia)   Diabetes type 2, controlled (HCC)   Hypothyroidism   Total Time spent with patient: 30 minutes  Subjective:   Julie Morrow is a 70 y.o. female patient admitted with "not any better".  HPI: Patient seen chart reviewed.  Patient has been compliant with medicine.  Blood sugars are a little better controlled.  No side effects noted from medication.  Labs reviewed and it looks like her TSH was okay so she must have been compliant with her medicine.  Patient continues to say her mood is bad and that she is having suicidal ideation but she is much less agitated than yesterday.  Not aggressive or threatening.  Past Psychiatric History: Past history of bipolar disorder  Risk to Self:   Risk to Others:   Prior Inpatient Therapy:   Prior Outpatient Therapy:    Past Medical History:  Past Medical History:  Diagnosis Date  . Anxiety   . Bipolar 1 disorder (HCC)   . Bradycardia   . Depression   . Diabetes mellitus, type II (HCC)    Patient takes Glucotrol and Januvia  . Hypothyroidism   . Lithium toxicity     Past Surgical History:  Procedure Laterality Date  . ABDOMINAL HYSTERECTOMY    . CHOLECYSTECTOMY     Family History:  Family History  Problem Relation Age of Onset  . Dementia Mother   . Arthritis Father    Family Psychiatric  History: See previous Social History:  Social History   Substance and Sexual Activity  Alcohol Use No  . Alcohol/week: 0.0 standard drinks     Social History   Substance and Sexual Activity  Drug Use No    Social History    Socioeconomic History  . Marital status: Single    Spouse name: Not on file  . Number of children: Not on file  . Years of education: Not on file  . Highest education level: Not on file  Occupational History  . Not on file  Tobacco Use  . Smoking status: Former Smoker    Packs/day: 0.50    Years: 20.00    Pack years: 10.00    Types: Cigarettes    Start date: 11/06/1984  . Smokeless tobacco: Never Used  Vaping Use  . Vaping Use: Never used  Substance and Sexual Activity  . Alcohol use: No    Alcohol/week: 0.0 standard drinks  . Drug use: No  . Sexual activity: Never  Other Topics Concern  . Not on file  Social History Narrative  . Not on file   Social Determinants of Health   Financial Resource Strain: Not on file  Food Insecurity: Not on file  Transportation Needs: Not on file  Physical Activity: Not on file  Stress: Not on file  Social Connections: Not on file   Additional Social History:    Allergies:  No Known Allergies  Labs:  Results for orders placed or performed during the hospital encounter of 09/16/20 (from the past 48 hour(s))  Comprehensive metabolic panel     Status: Abnormal   Collection Time: 09/16/20  4:21 PM  Result Value Ref Range   Sodium 136 135 -  145 mmol/L   Potassium 3.5 3.5 - 5.1 mmol/L   Chloride 100 98 - 111 mmol/L   CO2 24 22 - 32 mmol/L   Glucose, Bld 159 (H) 70 - 99 mg/dL    Comment: Glucose reference range applies only to samples taken after fasting for at least 8 hours.   BUN 13 8 - 23 mg/dL   Creatinine, Ser 0.21 0.44 - 1.00 mg/dL   Calcium 9.8 8.9 - 11.7 mg/dL   Total Protein 7.5 6.5 - 8.1 g/dL   Albumin 4.4 3.5 - 5.0 g/dL   AST 38 15 - 41 U/L   ALT 32 0 - 44 U/L   Alkaline Phosphatase 69 38 - 126 U/L   Total Bilirubin 0.7 0.3 - 1.2 mg/dL   GFR, Estimated >35 >67 mL/min    Comment: (NOTE) Calculated using the CKD-EPI Creatinine Equation (2021)    Anion gap 12 5 - 15    Comment: Performed at [facility],  [address] [place], [place] [address]  Ethanol     Status: None   Collection Time: 09/16/20  4:21 PM  Result Value Ref Range   Alcohol, Ethyl (B) <10 <10 mg/dL    Comment: (NOTE) Lowest detectable limit for serum alcohol is 10 mg/dL.  For medical purposes only. Performed at [facility], [address] [place], [place] [address]   Salicylate level     Status: Abnormal   Collection Time: 09/16/20  4:21 PM  Result Value Ref Range   Salicylate Lvl <7.0 (L) 7.0 - 30.0 mg/dL    Comment: Performed at [facility], [address] [place], [place] [address]  Acetaminophen level     Status: Abnormal   Collection Time: 09/16/20  4:21 PM  Result Value Ref Range   Acetaminophen (Tylenol), Serum <10 (L) 10 - 30 ug/mL    Comment: (NOTE) Therapeutic concentrations vary significantly. A range of 10-30 ug/mL  may be an effective concentration for many patients. However, some  are best treated at concentrations outside of this range. Acetaminophen concentrations >150 ug/mL at 4 hours after ingestion  and >50 ug/mL at 12 hours after ingestion are often associated with  toxic reactions.  Performed at [facility], [address] [place], [place] [address]   cbc     Status: Abnormal   Collection Time: 09/16/20  4:21 PM  Result Value Ref Range   WBC 11.7 (H) 4.0 - 10.5 K/uL   RBC 5.15 (H) 3.87 - 5.11 MIL/uL   Hemoglobin 15.9 (H) 12.0 - 15.0 g/dL   HCT 28.2 (H) 06.0 - 15.6 %   MCV 89.9 80.0 - 100.0 fL   MCH 30.9 26.0 - 34.0 pg   MCHC 34.3 30.0 - 36.0 g/dL   RDW 15.3 79.4 - 32.7 %   Platelets 192 150 - 400 K/uL   nRBC 0.0 0.0 - 0.2 %    Comment: Performed at [facility], [address] [place], [place] [address]  TSH     Status: None   Collection Time: 09/16/20  4:21 PM  Result Value Ref Range   TSH 1.116 0.350 - 4.500 uIU/mL    Comment: Performed by a 3rd Generation assay with a functional sensitivity of <=0.01 uIU/mL. Performed at  [facility], [address] [place], [place] [address]   Lipid panel     Status: Abnormal   Collection Time: 09/16/20  4:21 PM  Result Value Ref Range   Cholesterol 106 0 -  200 mg/dL   Triglycerides 952 (H) <150 mg/dL   HDL 28 (L) >84 mg/dL   Total CHOL/HDL Ratio 3.8 RATIO   VLDL 63 (H) 0 - 40 mg/dL   LDL Cholesterol 15 0 - 99 mg/dL    Comment:        Total Cholesterol/HDL:CHD Risk Coronary Heart Disease Risk Table                     Men   Women  1/2 Average Risk   3.4   3.3  Average Risk       5.0   4.4  2 X Average Risk   9.6   7.1  3 X Average Risk  23.4   11.0        Use the calculated Patient Ratio above and the CHD Risk Table to determine the patient's CHD Risk.        ATP III CLASSIFICATION (LDL):  <100     mg/dL   Optimal  132-440  mg/dL   Near or Above                    Optimal  130-159  mg/dL   Borderline  102-725  mg/dL   High  >366     mg/dL   Very High Performed at [facility], [address] [place], [place] [address]   Hemoglobin A1c     Status: Abnormal   Collection Time: 09/16/20  4:21 PM  Result Value Ref Range   Hgb A1c MFr Bld 6.5 (H) 4.8 - 5.6 %    Comment: (NOTE) Pre diabetes:          5.7%-6.4%  Diabetes:              >6.4%  Glycemic control for   <7.0% adults with diabetes    Mean Plasma Glucose 139.85 mg/dL    Comment: Performed at [facility], [address], [place], [place] [address]  Resp Panel by RT-PCR (Flu A&B, Covid) Nasopharyngeal Swab     Status: None   Collection Time: 09/16/20  4:45 PM   Specimen: Nasopharyngeal Swab; Nasopharyngeal(NP) swabs in vial transport medium  Result Value Ref Range   SARS Coronavirus 2 by RT PCR NEGATIVE NEGATIVE    Comment: (NOTE) SARS-CoV-2 target nucleic acids are NOT DETECTED.  The SARS-CoV-2 RNA is generally detectable in upper respiratory specimens during the acute phase of infection. The lowest concentration of SARS-CoV-2 viral copies this assay can detect  is 138 copies/mL. A negative result does not preclude SARS-Cov-2 infection and should not be used as the sole basis for treatment or other patient management decisions. A negative result may occur with  improper specimen collection/handling, submission of specimen other than nasopharyngeal swab, presence of viral mutation(s) within the areas targeted by this assay, and inadequate number of viral copies(<138 copies/mL). A negative result must be combined with clinical observations, patient history, and epidemiological information. The expected result is Negative.  Fact Sheet for Patients:  BloggerCourse.com  Fact Sheet for Healthcare Providers:  SeriousBroker.it  This test is no t yet approved or cleared by the [place] FDA and  has been authorized for detection and/or diagnosis of SARS-CoV-2 by FDA under an Emergency Use Authorization (EUA). This EUA will remain  in effect (meaning this test can be used) for the duration of the COVID-19 declaration under Section 564(b)(1) of the Act, 21 U.S.C.section 360bbb-3(b)(1), unless the authorization is terminated  or revoked  sooner.       Influenza A by PCR NEGATIVE NEGATIVE   Influenza B by PCR NEGATIVE NEGATIVE    Comment: (NOTE) The Xpert Xpress SARS-CoV-2/FLU/RSV plus assay is intended as an aid in the diagnosis of influenza from Nasopharyngeal swab specimens and should not be used as a sole basis for treatment. Nasal washings and aspirates are unacceptable for Xpert Xpress SARS-CoV-2/FLU/RSV testing.  Fact Sheet for Patients: BloggerCourse.comhttps://www.fda.gov/media/152166/download  Fact Sheet for Healthcare Providers: SeriousBroker.ithttps://www.fda.gov/media/152162/download  This test is not yet approved or cleared by the [place] FDA and has been authorized for detection and/or diagnosis of SARS-CoV-2 by FDA under an Emergency Use Authorization (EUA). This EUA will remain in effect (meaning this  test can be used) for the duration of the COVID-19 declaration under Section 564(b)(1) of the Act, 21 U.S.C. section 360bbb-3(b)(1), unless the authorization is terminated or revoked.  Performed at [facility], [address] [place], [place] [address]     Current Facility-Administered Medications  Medication Dose Route Frequency Provider Last Rate Last Admin  . aspirin EC tablet 81 mg  81 mg Oral Daily Kjersten Ormiston, Jackquline DenmarkJohn T, MD   81 mg at 09/17/20 0946  . benztropine (COGENTIN) tablet 0.5 mg  0.5 mg Oral QHS Shakti Fleer T, MD   0.5 mg at 09/16/20 2137  . fenofibrate tablet 160 mg  160 mg Oral Daily Maris Abascal, Jackquline DenmarkJohn T, MD   160 mg at 09/17/20 0947  . glipiZIDE (GLUCOTROL) tablet 10 mg  10 mg Oral BID AC Chantae Soo, Jackquline DenmarkJohn T, MD   10 mg at 09/17/20 1631  . levothyroxine (SYNTHROID) tablet 200 mcg  200 mcg Oral Q0600 Zayan Delvecchio, Jackquline DenmarkJohn T, MD   200 mcg at 09/17/20 [phone]  . loratadine (CLARITIN) tablet 10 mg  10 mg Oral Daily Bonifacio Pruden, Jackquline DenmarkJohn T, MD   10 mg at 09/17/20 0947  . metFORMIN (GLUCOPHAGE) tablet 500 mg  500 mg Oral Q breakfast Francesco Provencal, Jackquline DenmarkJohn T, MD   500 mg at 09/17/20 0946  . risperiDONE (RISPERDAL) tablet 6 mg  6 mg Oral QHS Arlin Sass, Jackquline DenmarkJohn T, MD   6 mg at 09/16/20 2137   Current Outpatient Medications  Medication Sig Dispense Refill  . aspirin 81 MG chewable tablet Chew 1 tablet (81 mg total) by mouth daily. (Patient not taking: Reported on 12/04/2019) 30 tablet 1  . CINNAMON PO Take 1 tablet by mouth daily.    . divalproex (DEPAKOTE ER) 250 MG 24 hr tablet Take 750 mg by mouth at bedtime.    . fenofibrate 160 MG tablet Take 1 tablet (160 mg total) by mouth daily with breakfast. (Patient not taking: Reported on 12/04/2019) 30 tablet 1  . FEROSUL 325 (65 Fe) MG tablet Take 325 mg by mouth daily.    Marland Kitchen. levothyroxine (SYNTHROID) 175 MCG tablet Take 175 mcg by mouth daily.    Marland Kitchen. levothyroxine (SYNTHROID) 200 MCG tablet Take 1 tablet (200 mcg total) by mouth daily before breakfast. (Patient not  taking: Reported on 12/04/2019) 30 tablet 1  . loratadine (CLARITIN) 10 MG tablet Take 1 tablet (10 mg total) by mouth daily. (Patient not taking: Reported on 12/04/2019) 30 tablet 1  . melatonin 5 MG TABS Take 5-10 mg by mouth at bedtime.    . metFORMIN (GLUCOPHAGE) 500 MG tablet Take 1 tablet (500 mg total) by mouth daily with breakfast. (Patient not taking: Reported on 12/04/2019) 30 tablet 1  . Multiple Vitamins-Minerals (CENTRUM) tablet Take 1 tablet by mouth daily.    . polyvinyl  alcohol (LIQUIFILM TEARS) 1.4 % ophthalmic solution Place 1 drop into both eyes as needed for dry eyes.    . Probiotic Product (PROBIOTIC PO) Take 1 capsule by mouth daily.    . risperiDONE (RISPERDAL) 3 MG tablet Take 2 tablets (6 mg total) by mouth at bedtime. (Patient not taking: Reported on 12/04/2019) 60 tablet 1  . rosuvastatin (CRESTOR) 40 MG tablet Take 40 mg by mouth daily.    . traZODone (DESYREL) 50 MG tablet Take 50 mg by mouth at bedtime as needed for sleep.      Musculoskeletal: Strength & Muscle Tone: within normal limits Gait & Station: normal Patient leans: N/A            Psychiatric Specialty Exam:  Presentation  General Appearance: No data recorded Eye Contact:No data recorded Speech:No data recorded Speech Volume:No data recorded Handedness:No data recorded  Mood and Affect  Mood:No data recorded Affect:No data recorded  Thought Process  Thought Processes:No data recorded Descriptions of Associations:No data recorded Orientation:No data recorded Thought Content:No data recorded History of Schizophrenia/Schizoaffective disorder:No  Duration of Psychotic Symptoms:No data recorded Hallucinations:No data recorded Ideas of Reference:No data recorded Suicidal Thoughts:No data recorded Homicidal Thoughts:No data recorded  Sensorium  Memory:No data recorded Judgment:No data recorded Insight:No data recorded  Executive Functions  Concentration:No data  recorded Attention Span:No data recorded Recall:No data recorded Fund of Knowledge:No data recorded Language:No data recorded  Psychomotor Activity  Psychomotor Activity:No data recorded  Assets  Assets:No data recorded  Sleep  Sleep:No data recorded  Physical Exam: Physical Exam Vitals and nursing note reviewed.  Constitutional:      Appearance: Normal appearance.  HENT:     Head: Normocephalic and atraumatic.     Mouth/Throat:     Pharynx: Oropharynx is clear.  Eyes:     Pupils: Pupils are equal, round, and reactive to light.  Cardiovascular:     Rate and Rhythm: Normal rate and regular rhythm.  Pulmonary:     Effort: Pulmonary effort is normal.     Breath sounds: Normal breath sounds.  Abdominal:     General: Abdomen is flat.     Palpations: Abdomen is soft.  Musculoskeletal:        General: Normal range of motion.  Skin:    General: Skin is warm and dry.  Neurological:     General: No focal deficit present.     Mental Status: She is alert. Mental status is at baseline.  Psychiatric:        Attention and Perception: Attention normal.        Mood and Affect: Mood is anxious and depressed.        Speech: Speech is delayed.        Behavior: Behavior is slowed.        Thought Content: Thought content includes suicidal ideation. Thought content does not include suicidal plan.        Cognition and Memory: Memory is impaired.        Judgment: Judgment is impulsive.    Review of Systems  Constitutional: Negative.   HENT: Negative.   Eyes: Negative.   Respiratory: Negative.   Cardiovascular: Negative.   Gastrointestinal: Negative.   Musculoskeletal: Negative.   Skin: Negative.   Neurological: Negative.   Psychiatric/Behavioral: Positive for depression, hallucinations and suicidal ideas. The patient is nervous/anxious.    Blood pressure 110/67, pulse 66, temperature 97.7 F (36.5 C), temperature source Oral, resp. rate 16, height 5\' 3"  (1.6 m), weight 136.1  kg, SpO2 96 %. Body mass index is 53.14 kg/m.  Treatment Plan Summary: Medication management and Plan Patient meets criteria for inpatient hospitalization.  I will go ahead and put in orders.  Case has been reviewed with TTS and nursing downstairs.  Plan for admission to the psychiatric unit with continued treatment  Disposition: Recommend psychiatric Inpatient admission when medically cleared. Supportive therapy provided about ongoing stressors.  Mordecai Rasmussen, MD 09/17/2020 5:06 PM

## 2020-09-17 NOTE — BH Assessment (Addendum)
Referral Check for Psychiatric Hospitalization:   [facility]     Julie Morrow [phone]- [phone]), Julie Morrow reports denied due to financial reasons   Julie Morrow [phone]), No answer   Julie Morrow [phone] -or- [phone]), Julie Morrow reports denied due to dementia and medical   [name] [phone]- [phone]) [phone] No behavioral health intake staff until after 8am   Julie Morrow ([phone]---[phone]), No answer at any 3 numbers   [place] [phone] or [phone]) Not accepting outside referrals   [facility] [phone] or [phone]) No longer in business   [place] [phone] or [phone]), No behavioral health intake staff available until the morning   [place] ([phone]) No answer, voicemail mailbox is full

## 2020-09-17 NOTE — ED Notes (Signed)
Pt given phone to speak with family  

## 2020-09-17 NOTE — ED Notes (Signed)
Patient requested her meds a little early to go to sleep. Tolerated well.

## 2020-09-17 NOTE — ED Notes (Signed)
VOL, accepted to [facility]U [id number] on 5.5.2022

## 2020-09-17 NOTE — ED Notes (Signed)
Patient declined to call her brother at this time.

## 2020-09-17 NOTE — ED Notes (Signed)
Pt spoke with her brother on the phone.

## 2020-09-17 NOTE — ED Notes (Signed)
Gery Pray (patient's brother) [phone]

## 2020-09-17 NOTE — BH Assessment (Signed)
Patient is to be admitted to [facility] by Dr. Toni Amend on tomm 09/18/20.  Attending Physician will be Dr. Toni Amend.   Patient has been assigned to room 305, by [facility] Charge Nurse Megan.     ER staff is aware of the admission:  Nitchia, ER Secretary    Dr. Lenard Lance, ER MD   Amy, Patient's Nurse   Ethelene Browns, Patient Access.

## 2020-09-17 NOTE — ED Notes (Signed)
Pt came to nurse's station door asking for new pants.  Pt had soiled her pants and bed.  Bed cleaned and clean linen provided.   Pt given new scrubs, wash cloth and soap to wash up.

## 2020-09-17 NOTE — ED Notes (Signed)
Pt sleeeping

## 2020-09-17 NOTE — ED Notes (Signed)
Patient requesting medication for diarrhea.  EDP made aware.

## 2020-09-18 DIAGNOSIS — F329 Major depressive disorder, single episode, unspecified: Secondary | ICD-10-CM | POA: Diagnosis not present

## 2020-09-18 MED ORDER — DIVALPROEX SODIUM 500 MG PO DR TAB
500.0000 mg | DELAYED_RELEASE_TABLET | Freq: Two times a day (BID) | ORAL | Status: DC
  Administered 2020-09-18 – 2020-09-19 (×3): 500 mg via ORAL
  Filled 2020-09-18 (×3): qty 1

## 2020-09-18 NOTE — ED Notes (Signed)
Pt assisted with tv and provided with pillow to prop up with when lying down to allow improved respirations and water

## 2020-09-18 NOTE — ED Notes (Signed)
Hourly rounding performed, patient currently awake in room, reminded for need of urine sample. Patient has no complaints at this time. Q15 minute rounds and monitoring via [employer] to continue.

## 2020-09-18 NOTE — ED Notes (Signed)
Pt provided with snack per request- sandwich tray, apple sauce and grape juice. Lights off in room per pt request

## 2020-09-18 NOTE — ED Notes (Signed)
Hourly rounding performed, patient currently asleep in room. Patient has no complaints at this time. Q15 minute rounds and monitoring via [employer] to continue. 

## 2020-09-18 NOTE — ED Notes (Signed)
Report received from Amy RN including SBAR. Patient alert and oriented, warm and dry, in no acute distress. Patient denies SI, HI, AVH and pain. Patient made aware of Q15 minute rounds and security cameras for their safety. Patient instructed to come to this nurse with needs or concerns. ?

## 2020-09-18 NOTE — ED Notes (Signed)
This nurse calls BMU to check on bed availability for pt. Informed by Susy Frizzle, RN of BMU that there is a call out and he is only nurse on unit and cannot take any more patients. This is same information shared with this nurse in report.

## 2020-09-18 NOTE — ED Notes (Signed)
Pt spoke with Gery Pray on the phone.

## 2020-09-18 NOTE — Consult Note (Signed)
[facility] Face-to-Face Psychiatry Consult   Reason for Consult: Consult follow-up with this 70 year old woman with bipolar depression in the emergency room Referring Physician: Fuller Plan Patient Identification: Julie Morrow MRN:  [id number] Principal Diagnosis: Bipolar 1 disorder, mixed, severe (HCC) Diagnosis:  Principal Problem:   Bipolar 1 disorder, mixed, severe (HCC) Active Problems:   HLD (hyperlipidemia)   Diabetes type 2, controlled (HCC)   Hypothyroidism   Total Time spent with patient: 30 minutes  Subjective:   Julie Morrow is a 70 y.o. female patient admitted with "not good".  HPI: Patient continues to endorse depressed mood hopelessness and suicidal thoughts.  Low energy.  Hard to get her engage in conversation.  It sounds like ever since she has been living independently she has been going downhill probably from self neglect and lack of stimulation.  Patient is mostly withdrawn and not acting out here.  We had an agreement to admit her downstairs but bed is not available yet.  Past Psychiatric History: Past history of bipolar depression  Risk to Self:   Risk to Others:   Prior Inpatient Therapy:   Prior Outpatient Therapy:    Past Medical History:  Past Medical History:  Diagnosis Date  . Anxiety   . Bipolar 1 disorder (HCC)   . Bradycardia   . Depression   . Diabetes mellitus, type II (HCC)    Patient takes Glucotrol and Januvia  . Hypothyroidism   . Lithium toxicity     Past Surgical History:  Procedure Laterality Date  . ABDOMINAL HYSTERECTOMY    . CHOLECYSTECTOMY     Family History:  Family History  Problem Relation Age of Onset  . Dementia Mother   . Arthritis Father    Family Psychiatric  History: See previous Social History:  Social History   Substance and Sexual Activity  Alcohol Use No  . Alcohol/week: 0.0 standard drinks     Social History   Substance and Sexual Activity  Drug Use No    Social History   Socioeconomic History  .  Marital status: Single    Spouse name: Not on file  . Number of children: Not on file  . Years of education: Not on file  . Highest education level: Not on file  Occupational History  . Not on file  Tobacco Use  . Smoking status: Former Smoker    Packs/day: 0.50    Years: 20.00    Pack years: 10.00    Types: Cigarettes    Start date: 11/06/1984  . Smokeless tobacco: Never Used  Vaping Use  . Vaping Use: Never used  Substance and Sexual Activity  . Alcohol use: No    Alcohol/week: 0.0 standard drinks  . Drug use: No  . Sexual activity: Never  Other Topics Concern  . Not on file  Social History Narrative  . Not on file   Social Determinants of Health   Financial Resource Strain: Not on file  Food Insecurity: Not on file  Transportation Needs: Not on file  Physical Activity: Not on file  Stress: Not on file  Social Connections: Not on file   Additional Social History:    Allergies:  No Known Allergies  Labs:  Results for orders placed or performed during the hospital encounter of 09/16/20 (from the past 48 hour(s))  Comprehensive metabolic panel     Status: Abnormal   Collection Time: 09/16/20  4:21 PM  Result Value Ref Range   Sodium 136 135 - 145 mmol/L  Potassium 3.5 3.5 - 5.1 mmol/L   Chloride 100 98 - 111 mmol/L   CO2 24 22 - 32 mmol/L   Glucose, Bld 159 (H) 70 - 99 mg/dL    Comment: Glucose reference range applies only to samples taken after fasting for at least 8 hours.   BUN 13 8 - 23 mg/dL   Creatinine, Ser 6.96 0.44 - 1.00 mg/dL   Calcium 9.8 8.9 - 29.5 mg/dL   Total Protein 7.5 6.5 - 8.1 g/dL   Albumin 4.4 3.5 - 5.0 g/dL   AST 38 15 - 41 U/L   ALT 32 0 - 44 U/L   Alkaline Phosphatase 69 38 - 126 U/L   Total Bilirubin 0.7 0.3 - 1.2 mg/dL   GFR, Estimated >28 >41 mL/min    Comment: (NOTE) Calculated using the CKD-EPI Creatinine Equation (2021)    Anion gap 12 5 - 15    Comment: Performed at [facility], [address]  [place], [place] [address]  Ethanol     Status: None   Collection Time: 09/16/20  4:21 PM  Result Value Ref Range   Alcohol, Ethyl (B) <10 <10 mg/dL    Comment: (NOTE) Lowest detectable limit for serum alcohol is 10 mg/dL.  For medical purposes only. Performed at [facility], [address] [place], [place] [address]   Salicylate level     Status: Abnormal   Collection Time: 09/16/20  4:21 PM  Result Value Ref Range   Salicylate Lvl <7.0 (L) 7.0 - 30.0 mg/dL    Comment: Performed at [facility], [address] [place], [place] [address]  Acetaminophen level     Status: Abnormal   Collection Time: 09/16/20  4:21 PM  Result Value Ref Range   Acetaminophen (Tylenol), Serum <10 (L) 10 - 30 ug/mL    Comment: (NOTE) Therapeutic concentrations vary significantly. A range of 10-30 ug/mL  may be an effective concentration for many patients. However, some  are best treated at concentrations outside of this range. Acetaminophen concentrations >150 ug/mL at 4 hours after ingestion  and >50 ug/mL at 12 hours after ingestion are often associated with  toxic reactions.  Performed at [facility], [address] [place], [place] [address]   cbc     Status: Abnormal   Collection Time: 09/16/20  4:21 PM  Result Value Ref Range   WBC 11.7 (H) 4.0 - 10.5 K/uL   RBC 5.15 (H) 3.87 - 5.11 MIL/uL   Hemoglobin 15.9 (H) 12.0 - 15.0 g/dL   HCT 42.5 (H) 95.6 - 38.7 %   MCV 89.9 80.0 - 100.0 fL   MCH 30.9 26.0 - 34.0 pg   MCHC 34.3 30.0 - 36.0 g/dL   RDW 56.4 33.2 - 95.1 %   Platelets 192 150 - 400 K/uL   nRBC 0.0 0.0 - 0.2 %    Comment: Performed at [facility], [address] [place], [place] [address]  TSH     Status: None   Collection Time: 09/16/20  4:21 PM  Result Value Ref Range   TSH 1.116 0.350 - 4.500 uIU/mL    Comment: Performed by a 3rd Generation assay with a functional sensitivity of <=0.01 uIU/mL. Performed at [facility], [address] [place], [place] [address]   Lipid panel     Status: Abnormal   Collection Time: 09/16/20  4:21 PM  Result Value Ref Range   Cholesterol 106 0 - 200 mg/dL  Triglycerides 315 (H) <150 mg/dL   HDL 28 (L) >62 mg/dL   Total CHOL/HDL Ratio 3.8 RATIO   VLDL 63 (H) 0 - 40 mg/dL   LDL Cholesterol 15 0 - 99 mg/dL    Comment:        Total Cholesterol/HDL:CHD Risk Coronary Heart Disease Risk Table                     Men   Women  1/2 Average Risk   3.4   3.3  Average Risk       5.0   4.4  2 X Average Risk   9.6   7.1  3 X Average Risk  23.4   11.0        Use the calculated Patient Ratio above and the CHD Risk Table to determine the patient's CHD Risk.        ATP III CLASSIFICATION (LDL):  <100     mg/dL   Optimal  836-629  mg/dL   Near or Above                    Optimal  130-159  mg/dL   Borderline  476-546  mg/dL   High  >503     mg/dL   Very High Performed at [facility], [address] [place], [place] [address]   Hemoglobin A1c     Status: Abnormal   Collection Time: 09/16/20  4:21 PM  Result Value Ref Range   Hgb A1c MFr Bld 6.5 (H) 4.8 - 5.6 %    Comment: (NOTE) Pre diabetes:          5.7%-6.4%  Diabetes:              >6.4%  Glycemic control for   <7.0% adults with diabetes    Mean Plasma Glucose 139.85 mg/dL    Comment: Performed at [facility], [address], [place], [place] [address]  Resp Panel by RT-PCR (Flu A&B, Covid) Nasopharyngeal Swab     Status: None   Collection Time: 09/16/20  4:45 PM   Specimen: Nasopharyngeal Swab; Nasopharyngeal(NP) swabs in vial transport medium  Result Value Ref Range   SARS Coronavirus 2 by RT PCR NEGATIVE NEGATIVE    Comment: (NOTE) SARS-CoV-2 target nucleic acids are NOT DETECTED.  The SARS-CoV-2 RNA is generally detectable in upper respiratory specimens during the acute phase of infection. The lowest concentration of SARS-CoV-2 viral copies this assay can detect is 138 copies/mL. A negative  result does not preclude SARS-Cov-2 infection and should not be used as the sole basis for treatment or other patient management decisions. A negative result may occur with  improper specimen collection/handling, submission of specimen other than nasopharyngeal swab, presence of viral mutation(s) within the areas targeted by this assay, and inadequate number of viral copies(<138 copies/mL). A negative result must be combined with clinical observations, patient history, and epidemiological information. The expected result is Negative.  Fact Sheet for Patients:  BloggerCourse.com  Fact Sheet for Healthcare Providers:  SeriousBroker.it  This test is no t yet approved or cleared by the [place] FDA and  has been authorized for detection and/or diagnosis of SARS-CoV-2 by FDA under an Emergency Use Authorization (EUA). This EUA will remain  in effect (meaning this test can be used) for the duration of the COVID-19 declaration under Section 564(b)(1) of the Act, 21 U.S.C.section 360bbb-3(b)(1), unless the authorization is terminated  or revoked sooner.  Influenza A by PCR NEGATIVE NEGATIVE   Influenza B by PCR NEGATIVE NEGATIVE    Comment: (NOTE) The Xpert Xpress SARS-CoV-2/FLU/RSV plus assay is intended as an aid in the diagnosis of influenza from Nasopharyngeal swab specimens and should not be used as a sole basis for treatment. Nasal washings and aspirates are unacceptable for Xpert Xpress SARS-CoV-2/FLU/RSV testing.  Fact Sheet for Patients: BloggerCourse.com  Fact Sheet for Healthcare Providers: SeriousBroker.it  This test is not yet approved or cleared by the [place] FDA and has been authorized for detection and/or diagnosis of SARS-CoV-2 by FDA under an Emergency Use Authorization (EUA). This EUA will remain in effect (meaning this test can be used) for the  duration of the COVID-19 declaration under Section 564(b)(1) of the Act, 21 U.S.C. section 360bbb-3(b)(1), unless the authorization is terminated or revoked.  Performed at [facility], [address] [place], [place] [address]     Current Facility-Administered Medications  Medication Dose Route Frequency Provider Last Rate Last Admin  . aspirin EC tablet 81 mg  81 mg Oral Daily Tayona Sarnowski T, MD   81 mg at 09/18/20 1003  . benztropine (COGENTIN) tablet 0.5 mg  0.5 mg Oral QHS Nisa Decaire T, MD   0.5 mg at 09/17/20 2024  . divalproex (DEPAKOTE) DR tablet 500 mg  500 mg Oral Q12H Devontaye Ground T, MD      . fenofibrate tablet 160 mg  160 mg Oral Daily Stephens Shreve T, MD   160 mg at 09/18/20 1002  . glipiZIDE (GLUCOTROL) tablet 10 mg  10 mg Oral BID AC Oney Folz T, MD   10 mg at 09/18/20 1001  . levothyroxine (SYNTHROID) tablet 200 mcg  200 mcg Oral Q0600 Verlin Duke, Jackquline Denmark, MD   200 mcg at 09/18/20 0540  . loratadine (CLARITIN) tablet 10 mg  10 mg Oral Daily Mahaley Schwering, Jackquline Denmark, MD   10 mg at 09/18/20 1003  . metFORMIN (GLUCOPHAGE) tablet 500 mg  500 mg Oral Q breakfast Kristene Liberati T, MD   500 mg at 09/18/20 1003  . risperiDONE (RISPERDAL) tablet 6 mg  6 mg Oral QHS Shaunette Gassner, Jackquline Denmark, MD   6 mg at 09/17/20 2023   Current Outpatient Medications  Medication Sig Dispense Refill  . aspirin 81 MG chewable tablet Chew 1 tablet (81 mg total) by mouth daily. (Patient not taking: Reported on 12/04/2019) 30 tablet 1  . CINNAMON PO Take 1 tablet by mouth daily.    . divalproex (DEPAKOTE ER) 250 MG 24 hr tablet Take 750 mg by mouth at bedtime.    . fenofibrate 160 MG tablet Take 1 tablet (160 mg total) by mouth daily with breakfast. (Patient not taking: Reported on 12/04/2019) 30 tablet 1  . FEROSUL 325 (65 Fe) MG tablet Take 325 mg by mouth daily.    Marland Kitchen levothyroxine (SYNTHROID) 175 MCG tablet Take 175 mcg by mouth daily.    Marland Kitchen levothyroxine (SYNTHROID) 200 MCG tablet Take 1 tablet (200 mcg  total) by mouth daily before breakfast. (Patient not taking: Reported on 12/04/2019) 30 tablet 1  . loratadine (CLARITIN) 10 MG tablet Take 1 tablet (10 mg total) by mouth daily. (Patient not taking: Reported on 12/04/2019) 30 tablet 1  . melatonin 5 MG TABS Take 5-10 mg by mouth at bedtime.    . metFORMIN (GLUCOPHAGE) 500 MG tablet Take 1 tablet (500 mg total) by mouth daily with breakfast. (Patient not taking: Reported on 12/04/2019) 30 tablet 1  . Multiple  Vitamins-Minerals (CENTRUM) tablet Take 1 tablet by mouth daily.    . polyvinyl alcohol (LIQUIFILM TEARS) 1.4 % ophthalmic solution Place 1 drop into both eyes as needed for dry eyes.    . Probiotic Product (PROBIOTIC PO) Take 1 capsule by mouth daily.    . risperiDONE (RISPERDAL) 3 MG tablet Take 2 tablets (6 mg total) by mouth at bedtime. (Patient not taking: Reported on 12/04/2019) 60 tablet 1  . rosuvastatin (CRESTOR) 40 MG tablet Take 40 mg by mouth daily.    . traZODone (DESYREL) 50 MG tablet Take 50 mg by mouth at bedtime as needed for sleep.      Musculoskeletal: Strength & Muscle Tone: within normal limits Gait & Station: normal Patient leans: N/A            Psychiatric Specialty Exam:  Presentation  General Appearance: No data recorded Eye Contact:No data recorded Speech:No data recorded Speech Volume:No data recorded Handedness:No data recorded  Mood and Affect  Mood:No data recorded Affect:No data recorded  Thought Process  Thought Processes:No data recorded Descriptions of Associations:No data recorded Orientation:No data recorded Thought Content:No data recorded History of Schizophrenia/Schizoaffective disorder:No  Duration of Psychotic Symptoms:No data recorded Hallucinations:No data recorded Ideas of Reference:No data recorded Suicidal Thoughts:No data recorded Homicidal Thoughts:No data recorded  Sensorium  Memory:No data recorded Judgment:No data recorded Insight:No data recorded  Executive  Functions  Concentration:No data recorded Attention Span:No data recorded Recall:No data recorded Fund of Knowledge:No data recorded Language:No data recorded  Psychomotor Activity  Psychomotor Activity:No data recorded  Assets  Assets:No data recorded  Sleep  Sleep:No data recorded  Physical Exam: Physical Exam Vitals and nursing note reviewed.  Constitutional:      Appearance: Normal appearance.  HENT:     Head: Normocephalic and atraumatic.     Mouth/Throat:     Pharynx: Oropharynx is clear.  Eyes:     Pupils: Pupils are equal, round, and reactive to light.  Cardiovascular:     Rate and Rhythm: Normal rate and regular rhythm.  Pulmonary:     Effort: Pulmonary effort is normal.     Breath sounds: Normal breath sounds.  Abdominal:     General: Abdomen is flat.     Palpations: Abdomen is soft.  Musculoskeletal:        General: Normal range of motion.  Skin:    General: Skin is warm and dry.  Neurological:     General: No focal deficit present.     Mental Status: She is alert. Mental status is at baseline.  Psychiatric:        Attention and Perception: She is inattentive.        Mood and Affect: Mood is depressed.        Speech: Speech normal.        Behavior: Behavior is slowed.        Thought Content: Thought content includes suicidal ideation.    Review of Systems  Constitutional: Negative.   HENT: Negative.   Eyes: Negative.   Respiratory: Negative.   Cardiovascular: Negative.   Gastrointestinal: Negative.   Musculoskeletal: Negative.   Skin: Negative.   Neurological: Negative.   Psychiatric/Behavioral: Positive for depression and suicidal ideas. The patient is nervous/anxious.    Blood pressure 95/71, pulse 89, temperature 97.9 F (36.6 C), temperature source Oral, resp. rate 16, height 5\' 3"  (1.6 m), weight 136.1 kg, SpO2 96 %. Body mass index is 53.14 kg/m.  Treatment Plan Summary: Daily contact with patient to assess and  evaluate symptoms and  progress in treatment, Medication management and Plan I am going to add modest dose Depakote to her regimen.  Continue the Risperdal.  Engage patient for a while in conversation with motivational interviewing.  Still on the list for admission to psychiatric unit.  Disposition: Recommend psychiatric Inpatient admission when medically cleared.  Mordecai RasmussenJohn Chey Cho, MD 09/18/2020 2:56 PM

## 2020-09-18 NOTE — ED Notes (Signed)
Hourly rounding performed, patient currently awake in room. Patient has no complaints at this time. Q15 minute rounds and monitoring via Security Cameras to continue. 

## 2020-09-18 NOTE — ED Provider Notes (Signed)
Emergency Medicine Observation Re-evaluation Note  Julie Morrow is a 70 y.o. female, seen on rounds today.  Pt initially presented to the ED for complaints of Suicidal  Currently, the patient is is no acute distress. Resting in bed.   Physical Exam  Blood pressure (!) 87/60, pulse 67, temperature 97.8 F (36.6 C), temperature source Oral, resp. rate 16, height 5\' 3"  (1.6 m), weight 136.1 kg, SpO2 97 %.  Physical Exam General: No apparent distress HEENT: moist mucous membranes CV: RRR Pulm: Normal WOB GI: soft and non tender MSK: no edema or cyanosis Neuro: face symmetric, moving all extremities     ED Course / MDM     I have reviewed the labs performed to date as well as medications administered while in observation.  Recent changes in the last 24 hours include none   Plan   Plan to admit later today. Patient is not under full IVC at this time.   , MD 09/18/20 [phone]

## 2020-09-18 NOTE — ED Notes (Signed)
Patient sleeping in no acute distress. VS not obtained at this time so patient can have uninterrupted sleep. Will continue to monitor for the remainder of the shift.  

## 2020-09-19 ENCOUNTER — Inpatient Hospital Stay (HOSPITAL_COMMUNITY)
Admission: AD | Admit: 2020-09-19 | Discharge: 2020-10-07 | DRG: 885 | Disposition: A | Discharge status: Other Acute Inpatient Hospital | Payer: Medicare Other | Source: Other Acute Inpatient Hospital | Attending: Emergency Medicine | Admitting: Emergency Medicine

## 2020-09-19 DIAGNOSIS — R627 Adult failure to thrive: Secondary | ICD-10-CM | POA: Diagnosis not present

## 2020-09-19 DIAGNOSIS — F3160 Bipolar disorder, current episode mixed, unspecified: Secondary | ICD-10-CM | POA: Diagnosis present

## 2020-09-19 DIAGNOSIS — R32 Unspecified urinary incontinence: Secondary | ICD-10-CM | POA: Diagnosis not present

## 2020-09-19 DIAGNOSIS — E119 Type 2 diabetes mellitus without complications: Secondary | ICD-10-CM | POA: Diagnosis not present

## 2020-09-19 DIAGNOSIS — Z7401 Bed confinement status: Secondary | ICD-10-CM | POA: Diagnosis not present

## 2020-09-19 DIAGNOSIS — F17210 Nicotine dependence, cigarettes, uncomplicated: Secondary | ICD-10-CM | POA: Diagnosis not present

## 2020-09-19 DIAGNOSIS — E781 Pure hyperglyceridemia: Secondary | ICD-10-CM | POA: Diagnosis present

## 2020-09-19 DIAGNOSIS — J449 Chronic obstructive pulmonary disease, unspecified: Secondary | ICD-10-CM | POA: Diagnosis present

## 2020-09-19 DIAGNOSIS — M79604 Pain in right leg: Secondary | ICD-10-CM | POA: Diagnosis not present

## 2020-09-19 DIAGNOSIS — Z20822 Contact with and (suspected) exposure to covid-19: Secondary | ICD-10-CM | POA: Diagnosis not present

## 2020-09-19 DIAGNOSIS — R296 Repeated falls: Secondary | ICD-10-CM | POA: Diagnosis not present

## 2020-09-19 DIAGNOSIS — F329 Major depressive disorder, single episode, unspecified: Secondary | ICD-10-CM | POA: Diagnosis not present

## 2020-09-19 DIAGNOSIS — G4700 Insomnia, unspecified: Secondary | ICD-10-CM | POA: Diagnosis present

## 2020-09-19 DIAGNOSIS — N390 Urinary tract infection, site not specified: Secondary | ICD-10-CM | POA: Diagnosis present

## 2020-09-19 DIAGNOSIS — E785 Hyperlipidemia, unspecified: Secondary | ICD-10-CM | POA: Diagnosis not present

## 2020-09-19 DIAGNOSIS — J302 Other seasonal allergic rhinitis: Secondary | ICD-10-CM | POA: Diagnosis present

## 2020-09-19 DIAGNOSIS — B965 Pseudomonas (aeruginosa) (mallei) (pseudomallei) as the cause of diseases classified elsewhere: Secondary | ICD-10-CM | POA: Diagnosis not present

## 2020-09-19 DIAGNOSIS — W19XXXA Unspecified fall, initial encounter: Secondary | ICD-10-CM

## 2020-09-19 DIAGNOSIS — Z820 Family history of epilepsy and other diseases of the nervous system: Secondary | ICD-10-CM | POA: Diagnosis not present

## 2020-09-19 DIAGNOSIS — K649 Unspecified hemorrhoids: Secondary | ICD-10-CM | POA: Diagnosis present

## 2020-09-19 DIAGNOSIS — Z86711 Personal history of pulmonary embolism: Secondary | ICD-10-CM

## 2020-09-19 DIAGNOSIS — F319 Bipolar disorder, unspecified: Secondary | ICD-10-CM | POA: Diagnosis not present

## 2020-09-19 DIAGNOSIS — Z7984 Long term (current) use of oral hypoglycemic drugs: Secondary | ICD-10-CM | POA: Diagnosis not present

## 2020-09-19 DIAGNOSIS — F0390 Unspecified dementia without behavioral disturbance: Secondary | ICD-10-CM

## 2020-09-19 DIAGNOSIS — E882 Lipomatosis, not elsewhere classified: Secondary | ICD-10-CM | POA: Diagnosis not present

## 2020-09-19 DIAGNOSIS — R4781 Slurred speech: Secondary | ICD-10-CM | POA: Diagnosis not present

## 2020-09-19 DIAGNOSIS — F3163 Bipolar disorder, current episode mixed, severe, without psychotic features: Secondary | ICD-10-CM | POA: Diagnosis present

## 2020-09-19 DIAGNOSIS — Z6841 Body Mass Index (BMI) 40.0 and over, adult: Secondary | ICD-10-CM | POA: Diagnosis not present

## 2020-09-19 DIAGNOSIS — Z79890 Hormone replacement therapy (postmenopausal): Secondary | ICD-10-CM | POA: Diagnosis not present

## 2020-09-19 DIAGNOSIS — Z8261 Family history of arthritis: Secondary | ICD-10-CM | POA: Diagnosis not present

## 2020-09-19 DIAGNOSIS — G9340 Encephalopathy, unspecified: Secondary | ICD-10-CM | POA: Diagnosis not present

## 2020-09-19 DIAGNOSIS — Z86718 Personal history of other venous thrombosis and embolism: Secondary | ICD-10-CM | POA: Diagnosis not present

## 2020-09-19 DIAGNOSIS — R531 Weakness: Secondary | ICD-10-CM | POA: Diagnosis not present

## 2020-09-19 DIAGNOSIS — K219 Gastro-esophageal reflux disease without esophagitis: Secondary | ICD-10-CM | POA: Diagnosis not present

## 2020-09-19 DIAGNOSIS — Z7982 Long term (current) use of aspirin: Secondary | ICD-10-CM | POA: Diagnosis not present

## 2020-09-19 DIAGNOSIS — F314 Bipolar disorder, current episode depressed, severe, without psychotic features: Secondary | ICD-10-CM | POA: Diagnosis present

## 2020-09-19 DIAGNOSIS — Z79899 Other long term (current) drug therapy: Secondary | ICD-10-CM | POA: Diagnosis not present

## 2020-09-19 DIAGNOSIS — G928 Other toxic encephalopathy: Secondary | ICD-10-CM | POA: Diagnosis not present

## 2020-09-19 DIAGNOSIS — E039 Hypothyroidism, unspecified: Secondary | ICD-10-CM | POA: Diagnosis not present

## 2020-09-19 DIAGNOSIS — R1310 Dysphagia, unspecified: Secondary | ICD-10-CM | POA: Diagnosis not present

## 2020-09-19 DIAGNOSIS — M25461 Effusion, right knee: Secondary | ICD-10-CM | POA: Diagnosis not present

## 2020-09-19 DIAGNOSIS — M4802 Spinal stenosis, cervical region: Secondary | ICD-10-CM | POA: Diagnosis not present

## 2020-09-19 DIAGNOSIS — R269 Unspecified abnormalities of gait and mobility: Secondary | ICD-10-CM | POA: Diagnosis not present

## 2020-09-19 DIAGNOSIS — K5900 Constipation, unspecified: Secondary | ICD-10-CM | POA: Diagnosis present

## 2020-09-19 DIAGNOSIS — B369 Superficial mycosis, unspecified: Secondary | ICD-10-CM | POA: Diagnosis present

## 2020-09-19 DIAGNOSIS — R4182 Altered mental status, unspecified: Secondary | ICD-10-CM | POA: Diagnosis present

## 2020-09-19 DIAGNOSIS — Z743 Need for continuous supervision: Secondary | ICD-10-CM | POA: Diagnosis not present

## 2020-09-19 DIAGNOSIS — R0902 Hypoxemia: Secondary | ICD-10-CM | POA: Diagnosis not present

## 2020-09-19 DIAGNOSIS — G9389 Other specified disorders of brain: Secondary | ICD-10-CM | POA: Diagnosis not present

## 2020-09-19 DIAGNOSIS — S0990XA Unspecified injury of head, initial encounter: Secondary | ICD-10-CM | POA: Diagnosis not present

## 2020-09-19 DIAGNOSIS — R45851 Suicidal ideations: Secondary | ICD-10-CM | POA: Diagnosis present

## 2020-09-19 DIAGNOSIS — F419 Anxiety disorder, unspecified: Secondary | ICD-10-CM | POA: Diagnosis present

## 2020-09-19 DIAGNOSIS — M25551 Pain in right hip: Secondary | ICD-10-CM | POA: Diagnosis not present

## 2020-09-19 DIAGNOSIS — S199XXA Unspecified injury of neck, initial encounter: Secondary | ICD-10-CM | POA: Diagnosis not present

## 2020-09-19 DIAGNOSIS — M47812 Spondylosis without myelopathy or radiculopathy, cervical region: Secondary | ICD-10-CM | POA: Diagnosis not present

## 2020-09-19 DIAGNOSIS — N898 Other specified noninflammatory disorders of vagina: Secondary | ICD-10-CM | POA: Diagnosis not present

## 2020-09-19 DIAGNOSIS — R9431 Abnormal electrocardiogram [ECG] [EKG]: Secondary | ICD-10-CM | POA: Diagnosis not present

## 2020-09-19 LAB — RESP PANEL BY RT-PCR (FLU A&B, COVID) ARPGX2
Influenza A by PCR: NEGATIVE
Influenza B by PCR: NEGATIVE
SARS Coronavirus 2 by RT PCR: NEGATIVE

## 2020-09-19 MED ORDER — LORATADINE 10 MG PO TABS
10.0000 mg | ORAL_TABLET | Freq: Every day | ORAL | Status: DC
  Administered 2020-09-20 – 2020-10-07 (×18): 10 mg via ORAL
  Filled 2020-09-19 (×23): qty 1

## 2020-09-19 MED ORDER — ALUM & MAG HYDROXIDE-SIMETH 200-200-20 MG/5ML PO SUSP: 30.0000 mL | ORAL | Status: DC | PRN

## 2020-09-19 MED ORDER — BENZTROPINE MESYLATE 0.5 MG PO TABS
0.5000 mg | ORAL_TABLET | Freq: Every day | ORAL | Status: DC
  Administered 2020-09-19 – 2020-09-22 (×4): 0.5 mg via ORAL
  Filled 2020-09-19 (×6): qty 1

## 2020-09-19 MED ORDER — GLIPIZIDE 10 MG PO TABS
10.0000 mg | ORAL_TABLET | Freq: Two times a day (BID) | ORAL | Status: DC
  Administered 2020-09-20 (×2): 10 mg via ORAL
  Filled 2020-09-19 (×2): qty 1
  Filled 2020-09-19: qty 2
  Filled 2020-09-19 (×3): qty 1

## 2020-09-19 MED ORDER — RISPERIDONE 3 MG PO TABS
6.0000 mg | ORAL_TABLET | Freq: Every day | ORAL | Status: DC
  Administered 2020-09-19 – 2020-09-24 (×6): 6 mg via ORAL
  Filled 2020-09-19 (×8): qty 2

## 2020-09-19 MED ORDER — METFORMIN HCL 500 MG PO TABS
500.0000 mg | ORAL_TABLET | Freq: Every day | ORAL | Status: DC
  Administered 2020-09-20 – 2020-10-07 (×18): 500 mg via ORAL
  Filled 2020-09-19 (×22): qty 1

## 2020-09-19 MED ORDER — ASPIRIN EC 81 MG PO TBEC
81.0000 mg | DELAYED_RELEASE_TABLET | Freq: Every day | ORAL | Status: DC
  Administered 2020-09-20 – 2020-10-07 (×18): 81 mg via ORAL
  Filled 2020-09-19 (×20): qty 1

## 2020-09-19 MED ORDER — ACETAMINOPHEN 325 MG PO TABS
650.0000 mg | ORAL_TABLET | Freq: Four times a day (QID) | ORAL | Status: DC | PRN
  Administered 2020-09-19 – 2020-10-05 (×8): 650 mg via ORAL
  Filled 2020-09-19 (×10): qty 2

## 2020-09-19 MED ORDER — MAGNESIUM HYDROXIDE 400 MG/5ML PO SUSP: 30.0000 mL | Freq: Every day | ORAL | Status: DC | PRN

## 2020-09-19 MED ORDER — LEVOTHYROXINE SODIUM 200 MCG PO TABS
200.0000 ug | ORAL_TABLET | Freq: Every day | ORAL | Status: DC
  Administered 2020-09-20 – 2020-09-23 (×4): 200 ug via ORAL
  Filled 2020-09-19: qty 2
  Filled 2020-09-19 (×5): qty 1

## 2020-09-19 MED ORDER — FENOFIBRATE 160 MG PO TABS
160.0000 mg | ORAL_TABLET | Freq: Every day | ORAL | Status: DC
  Administered 2020-09-20 – 2020-10-07 (×18): 160 mg via ORAL
  Filled 2020-09-19 (×21): qty 1

## 2020-09-19 NOTE — ED Notes (Signed)
Hourly rounding performed, patient currently asleep in room. Patient has no complaints at this time. Q15 minute rounds and monitoring via [employer] to continue. 

## 2020-09-19 NOTE — Consult Note (Signed)
[facility] Face-to-Face Psychiatry Consult   Reason for Consult: Consult follow-up 70 year old woman with bipolar disorder Referring Physician: Scotty Court Patient Identification: Julie Morrow MRN:  [id number] Principal Diagnosis: Bipolar 1 disorder, mixed, severe (HCC) Diagnosis:  Principal Problem:   Bipolar 1 disorder, mixed, severe (HCC) Active Problems:   HLD (hyperlipidemia)   Diabetes type 2, controlled (HCC)   Hypothyroidism   Total Time spent with patient: 30 minutes  Subjective:   Julie Morrow is a 70 y.o. female patient admitted with "I am not any better".  HPI: Follow-up on this 70 year old woman with bipolar disorder who presented with agitated depression and statements of suicidal ideation.  Patient has been restarted on psychiatric medicines and has been awaiting appropriate bed placement.  She continues to endorse depressed mood suicidal thoughts occasional hallucinations hopelessness and dysphoria.  Physically she appears to be stable.  She is fully ambulatory and able to take care of all of her usual activities of daily living.  No sign of dementia.  Cooperative with medication including Depakote which has been added to her Risperdal.  Past Psychiatric History: History of bipolar disorder with both manic and depressed presentations.  Recently has moved into her own independent living situation which I think has been a big stress.  Risk to Self:   Risk to Others:   Prior Inpatient Therapy:   Prior Outpatient Therapy:    Past Medical History:  Past Medical History:  Diagnosis Date  . Anxiety   . Bipolar 1 disorder (HCC)   . Bradycardia   . Depression   . Diabetes mellitus, type II (HCC)    Patient takes Glucotrol and Januvia  . Hypothyroidism   . Lithium toxicity     Past Surgical History:  Procedure Laterality Date  . ABDOMINAL HYSTERECTOMY    . CHOLECYSTECTOMY     Family History:  Family History  Problem Relation Age of Onset  . Dementia Mother   .  Arthritis Father    Family Psychiatric  History: See previous Social History:  Social History   Substance and Sexual Activity  Alcohol Use No  . Alcohol/week: 0.0 standard drinks     Social History   Substance and Sexual Activity  Drug Use No    Social History   Socioeconomic History  . Marital status: Single    Spouse name: Not on file  . Number of children: Not on file  . Years of education: Not on file  . Highest education level: Not on file  Occupational History  . Not on file  Tobacco Use  . Smoking status: Former Smoker    Packs/day: 0.50    Years: 20.00    Pack years: 10.00    Types: Cigarettes    Start date: 11/06/1984  . Smokeless tobacco: Never Used  Vaping Use  . Vaping Use: Never used  Substance and Sexual Activity  . Alcohol use: No    Alcohol/week: 0.0 standard drinks  . Drug use: No  . Sexual activity: Never  Other Topics Concern  . Not on file  Social History Narrative  . Not on file   Social Determinants of Health   Financial Resource Strain: Not on file  Food Insecurity: Not on file  Transportation Needs: Not on file  Physical Activity: Not on file  Stress: Not on file  Social Connections: Not on file   Additional Social History:    Allergies:  No Known Allergies  Labs:  Results for orders placed or performed during the hospital  encounter of 09/16/20 (from the past 48 hour(s))  Resp Panel by RT-PCR (Flu A&B, Covid) Nasopharyngeal Swab     Status: None   Collection Time: 09/19/20  2:30 PM   Specimen: Nasopharyngeal Swab; Nasopharyngeal(NP) swabs in vial transport medium  Result Value Ref Range   SARS Coronavirus 2 by RT PCR NEGATIVE NEGATIVE    Comment: (NOTE) SARS-CoV-2 target nucleic acids are NOT DETECTED.  The SARS-CoV-2 RNA is generally detectable in upper respiratory specimens during the acute phase of infection. The lowest concentration of SARS-CoV-2 viral copies this assay can detect is 138 copies/mL. A negative result  does not preclude SARS-Cov-2 infection and should not be used as the sole basis for treatment or other patient management decisions. A negative result may occur with  improper specimen collection/handling, submission of specimen other than nasopharyngeal swab, presence of viral mutation(s) within the areas targeted by this assay, and inadequate number of viral copies(<138 copies/mL). A negative result must be combined with clinical observations, patient history, and epidemiological information. The expected result is Negative.  Fact Sheet for Patients:  BloggerCourse.com  Fact Sheet for Healthcare Providers:  SeriousBroker.it  This test is no t yet approved or cleared by the [place] FDA and  has been authorized for detection and/or diagnosis of SARS-CoV-2 by FDA under an Emergency Use Authorization (EUA). This EUA will remain  in effect (meaning this test can be used) for the duration of the COVID-19 declaration under Section 564(b)(1) of the Act, 21 U.S.C.section 360bbb-3(b)(1), unless the authorization is terminated  or revoked sooner.       Influenza A by PCR NEGATIVE NEGATIVE   Influenza B by PCR NEGATIVE NEGATIVE    Comment: (NOTE) The Xpert Xpress SARS-CoV-2/FLU/RSV plus assay is intended as an aid in the diagnosis of influenza from Nasopharyngeal swab specimens and should not be used as a sole basis for treatment. Nasal washings and aspirates are unacceptable for Xpert Xpress SARS-CoV-2/FLU/RSV testing.  Fact Sheet for Patients: BloggerCourse.com  Fact Sheet for Healthcare Providers: SeriousBroker.it  This test is not yet approved or cleared by the [place] FDA and has been authorized for detection and/or diagnosis of SARS-CoV-2 by FDA under an Emergency Use Authorization (EUA). This EUA will remain in effect (meaning this test can be used) for the duration  of the COVID-19 declaration under Section 564(b)(1) of the Act, 21 U.S.C. section 360bbb-3(b)(1), unless the authorization is terminated or revoked.  Performed at [facility], [address], [place], [place] [address]     Current Facility-Administered Medications  Medication Dose Route Frequency Provider Last Rate Last Admin  . aspirin EC tablet 81 mg  81 mg Oral Daily Taeko Schaffer, Jackquline Denmark, MD   81 mg at 09/19/20 0946  . benztropine (COGENTIN) tablet 0.5 mg  0.5 mg Oral QHS Yvonne Petite T, MD   0.5 mg at 09/18/20 2127  . divalproex (DEPAKOTE) DR tablet 500 mg  500 mg Oral Q12H Jensine Luz, Jackquline Denmark, MD   500 mg at 09/19/20 0947  . fenofibrate tablet 160 mg  160 mg Oral Daily Keshawn Fiorito, Jackquline Denmark, MD   160 mg at 09/19/20 0948  . glipiZIDE (GLUCOTROL) tablet 10 mg  10 mg Oral BID AC Emmarae Cowdery T, MD   10 mg at 09/19/20 1023  . levothyroxine (SYNTHROID) tablet 200 mcg  200 mcg Oral Q0600 Brittannie Tawney, Jackquline Denmark, MD   200 mcg at 09/19/20 0949  . loratadine (CLARITIN) tablet 10 mg  10 mg Oral Daily Kenza Munar T,  MD   10 mg at 09/19/20 0947  . metFORMIN (GLUCOPHAGE) tablet 500 mg  500 mg Oral Q breakfast Cordarious Zeek, Jackquline DenmarkJohn T, MD   500 mg at 09/19/20 0948  . risperiDONE (RISPERDAL) tablet 6 mg  6 mg Oral QHS Ladesha Pacini, Jackquline DenmarkJohn T, MD   6 mg at 09/18/20 2127   Current Outpatient Medications  Medication Sig Dispense Refill  . aspirin 81 MG chewable tablet Chew 1 tablet (81 mg total) by mouth daily. (Patient not taking: No sig reported) 30 tablet 1  . CINNAMON PO Take 1 tablet by mouth daily.    . divalproex (DEPAKOTE ER) 250 MG 24 hr tablet Take 750 mg by mouth at bedtime.    . fenofibrate 160 MG tablet Take 1 tablet (160 mg total) by mouth daily with breakfast. (Patient not taking: No sig reported) 30 tablet 1  . FEROSUL 325 (65 Fe) MG tablet Take 325 mg by mouth daily.    . hydrOXYzine (ATARAX/VISTARIL) 10 MG tablet Take 10 mg by mouth 3 (three) times daily as needed. Take 10 mg by mouth three times daily as  needed for anxiety and 30 mg at bedtime as needed for sleep    . levothyroxine (SYNTHROID) 175 MCG tablet Take 175 mcg by mouth daily.    Marland Kitchen. levothyroxine (SYNTHROID) 200 MCG tablet Take 1 tablet (200 mcg total) by mouth daily before breakfast. (Patient not taking: No sig reported) 30 tablet 1  . loratadine (CLARITIN) 10 MG tablet Take 1 tablet (10 mg total) by mouth daily. (Patient not taking: No sig reported) 30 tablet 1  . losartan (COZAAR) 25 MG tablet Take 25 mg by mouth daily.    . melatonin 5 MG TABS Take 5-10 mg by mouth at bedtime.    . metFORMIN (GLUCOPHAGE) 500 MG tablet Take 1 tablet (500 mg total) by mouth daily with breakfast. (Patient not taking: No sig reported) 30 tablet 1  . Multiple Vitamins-Minerals (CENTRUM) tablet Take 1 tablet by mouth daily.    . polyvinyl alcohol (LIQUIFILM TEARS) 1.4 % ophthalmic solution Place 1 drop into both eyes as needed for dry eyes.    . Probiotic Product (PROBIOTIC PO) Take 1 capsule by mouth daily.    . risperiDONE (RISPERDAL) 3 MG tablet Take 2 tablets (6 mg total) by mouth at bedtime. (Patient not taking: No sig reported) 60 tablet 1  . rosuvastatin (CRESTOR) 40 MG tablet Take 40 mg by mouth daily.    . RYBELSUS 14 MG TABS Take 1 tablet by mouth every morning.    . traZODone (DESYREL) 50 MG tablet Take 50 mg by mouth at bedtime as needed for sleep.      Musculoskeletal: Strength & Muscle Tone: within normal limits Gait & Station: normal Patient leans: N/A            Psychiatric Specialty Exam:  Presentation  General Appearance: No data recorded Eye Contact:No data recorded Speech:No data recorded Speech Volume:No data recorded Handedness:No data recorded  Mood and Affect  Mood:No data recorded Affect:No data recorded  Thought Process  Thought Processes:No data recorded Descriptions of Associations:No data recorded Orientation:No data recorded Thought Content:No data recorded History of Schizophrenia/Schizoaffective  disorder:No  Duration of Psychotic Symptoms:No data recorded Hallucinations:No data recorded Ideas of Reference:No data recorded Suicidal Thoughts:No data recorded Homicidal Thoughts:No data recorded  Sensorium  Memory:No data recorded Judgment:No data recorded Insight:No data recorded  Executive Functions  Concentration:No data recorded Attention Span:No data recorded Recall:No data recorded Fund of Knowledge:No data recorded  Language:No data recorded  Psychomotor Activity  Psychomotor Activity:No data recorded  Assets  Assets:No data recorded  Sleep  Sleep:No data recorded  Physical Exam: Physical Exam Vitals and nursing note reviewed.  Constitutional:      Appearance: Normal appearance.  HENT:     Head: Normocephalic and atraumatic.     Mouth/Throat:     Pharynx: Oropharynx is clear.  Eyes:     Pupils: Pupils are equal, round, and reactive to light.  Cardiovascular:     Rate and Rhythm: Normal rate and regular rhythm.  Pulmonary:     Effort: Pulmonary effort is normal.     Breath sounds: Normal breath sounds.  Abdominal:     General: Abdomen is flat.     Palpations: Abdomen is soft.  Musculoskeletal:        General: Normal range of motion.  Skin:    General: Skin is warm and dry.  Neurological:     General: No focal deficit present.     Mental Status: She is alert. Mental status is at baseline.  Psychiatric:        Attention and Perception: Attention normal.        Mood and Affect: Mood is depressed.        Speech: Speech is delayed.        Behavior: Behavior is slowed.        Thought Content: Thought content includes suicidal ideation.        Cognition and Memory: Cognition normal.        Judgment: Judgment is impulsive.    Review of Systems  Constitutional: Negative.   HENT: Negative.   Eyes: Negative.   Respiratory: Negative.   Cardiovascular: Negative.   Gastrointestinal: Negative.   Musculoskeletal: Negative.   Skin: Negative.    Neurological: Negative.   Psychiatric/Behavioral: Positive for depression, hallucinations and suicidal ideas.   Blood pressure 115/77, pulse 75, temperature 98.7 F (37.1 C), temperature source Oral, resp. rate 18, height 5\' 3"  (1.6 m), weight 136.1 kg, SpO2 95 %. Body mass index is 53.14 kg/m.  Treatment Plan Summary: Medication management and Plan Patient continues to meet criteria for inpatient hospitalization.  We have been advised that [facility] has a bed available today and will accept the patient for transfer.  Nursing is aware and is working on arranging transportation.  No change for now to current medication  Disposition: Recommend psychiatric Inpatient admission when medically cleared.  , MD 09/19/2020 3:46 PM

## 2020-09-19 NOTE — ED Notes (Signed)
Report called to [facility] at this time to [place], [place]. Secretary to be notified to set up transport.

## 2020-09-19 NOTE — ED Notes (Signed)
Pt signs discharge paper on placed in chart. Pt belonging bag 1 of 1 sent with pt

## 2020-09-19 NOTE — BH Assessment (Deleted)
Patient is to be admitted to [facility] by Dr. Toni Amend.  Attending Physician will be Dr. Neale Burly.   Patient has been assigned to room 305, by [facility] Charge Nurse Molli Hazard.   Intake Paper Work has been signed and placed on patient chart.  ER staff is aware of the admission:  Melody, ER Secretary    Dr. Larinda Buttery, ER MD   Cristal Deer, Patient's Nurse   Rosey Bath Patient Access.

## 2020-09-19 NOTE — ED Notes (Signed)
Pt asleep at this time, unable to collect vitals. Will collect pt vitals once awake. 

## 2020-09-19 NOTE — ED Notes (Signed)
Bed and room search completed no issues at this time notified nurse Bill.Katrinka Blazing

## 2020-09-19 NOTE — ED Notes (Signed)
Pt can go to [facility] after 1930 today 09/19/20 Room 407-2 Dr. Jola Babinski Call report [phone]  Voluntary consent form signed and on chart

## 2020-09-19 NOTE — ED Notes (Signed)
Attempted to call report to [facility], informed that nurses are still in report. Will call back.

## 2020-09-19 NOTE — ED Notes (Addendum)
Hourly rounding performed, patient currently asleep in room. Patient has no complaints at this time. Q15 minute rounds and monitoring via Security Cameras to continue. 

## 2020-09-19 NOTE — ED Notes (Signed)
Report received from [place], English as a second language teacher. Patient alert and oriented, warm and dry, in no acute distress. Patient denies SI, HI, AVH and pain. Patient made aware of Q15 minute rounds and security cameras for their safety. Patient instructed to come to this nurse with needs or concerns.

## 2020-09-19 NOTE — ED Provider Notes (Signed)
Emergency Medicine Observation Re-evaluation Note  Julie Morrow is a 70 y.o. female, seen on rounds today.  Pt initially presented to the ED for complaints of Suicidal  Currently, the patient is calm, no acute complaints.  Physical Exam  Blood pressure 132/60, pulse 67, temperature 98.4 F (36.9 C), temperature source Oral, resp. rate 19, height 5\' 3"  (1.6 m), weight 136.1 kg, SpO2 95 %. Physical Exam General: NAD Lungs: CTAB Psych: not agitated  ED Course / MDM  EKG:    I have reviewed the labs performed to date as well as medications administered while in observation.  Recent changes in the last 24 hours include no acute events overnight.    Plan  Current plan is for psychiatric admission. Patient is not under full IVC at this time.   , MD 09/19/20 [phone]

## 2020-09-19 NOTE — ED Notes (Signed)
Hourly rounding performed, patient currently awake in room. Patient has no complaints at this time. Q15 minute rounds and monitoring via Security Cameras to continue. 

## 2020-09-20 ENCOUNTER — Encounter (HOSPITAL_COMMUNITY): Payer: Self-pay | Admitting: Psychiatry

## 2020-09-20 ENCOUNTER — Other Ambulatory Visit: Payer: Self-pay

## 2020-09-20 DIAGNOSIS — F3160 Bipolar disorder, current episode mixed, unspecified: Secondary | ICD-10-CM | POA: Diagnosis not present

## 2020-09-20 NOTE — Progress Notes (Signed)
   09/20/20 2110  Psych Admission Type (Psych Patients Only)  Admission Status Voluntary  Psychosocial Assessment  Patient Complaints Depression  Eye Contact Fair  Facial Expression Flat  Affect Appropriate to circumstance  Speech Logical/coherent  Interaction Assertive  Motor Activity Slow  Appearance/Hygiene Disheveled;In scrubs  Behavior Characteristics Appropriate to situation  Mood Pleasant;Depressed  Thought Process  Coherency WDL  Content WDL  Delusions None reported or observed  Perception WDL  Hallucination None reported or observed  Judgment WDL  Confusion None  Danger to Self  Current suicidal ideation? Denies  Danger to Others  Danger to Others None reported or observed

## 2020-09-20 NOTE — Progress Notes (Signed)
Did not attend group 
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
## 2020-09-20 NOTE — Progress Notes (Signed)
[place] NOVEL CORONAVIRUS (COVID-19) DAILY CHECK-OFF SYMPTOMS - answer yes or no to each - every day NO YES  Have you had a fever in the past 24 hours?  . Fever (Temp > 37.80C / 100F) X   Have you had any of these symptoms in the past 24 hours? . New Cough .  Sore Throat  .  Shortness of Breath .  Difficulty Breathing .  Unexplained Body Aches   X   Have you had any one of these symptoms in the past 24 hours not related to allergies?   . Runny Nose .  Nasal Congestion .  Sneezing   X   If you have had runny nose, nasal congestion, sneezing in the past 24 hours, has it worsened?  X   EXPOSURES - check yes or no X   Have you traveled outside the state in the past 14 days?  X   Have you been in contact with someone with a confirmed diagnosis of COVID-19 or PUI in the past 14 days without wearing appropriate PPE?  X   Have you been living in the same home as a person with confirmed diagnosis of COVID-19 or a PUI (household contact)?    X   Have you been diagnosed with COVID-19?    X              What to do next: Answered NO to all: Answered YES to anything:   Proceed with unit schedule Follow the [facility] Inpatient Flowsheet.   

## 2020-09-20 NOTE — [facility] Group Notes (Signed)
[facility] Group Notes:  (Nursing/MHT/Case Management/Adjunct)  Date:  09/20/2020  Time:  11:02 PM  Type of Therapy:  Wrap up group  Participation Level:  Did Not Attend  Participation Quality:    Affect:    Cognitive:    Insight:    Engagement in Group:  Did not attend  Modes of Intervention:  Discussion and Education  Summary of Progress/Problems:  Did not attend  Levin Bacon 09/20/2020, 11:02 PM

## 2020-09-20 NOTE — [facility] Suicide Risk Assessment (Signed)
[facility] Admission Suicide Risk Assessment   Nursing information obtained from:  Patient,Review of record Demographic factors:  Age 70 or older,Caucasian,Living alone Current Mental Status:  Suicidal ideation indicated by patient,Self-harm thoughts Loss Factors:  Decline in physical health Historical Factors:  Prior suicide attempts,Family history of mental illness or substance abuse Risk Reduction Factors:  Positive social support  Total Time spent with patient: 30 minutes Principal Problem: <principal problem not specified> Diagnosis:  Active Problems:   Bipolar disorder, mixed (HCC)  Subjective Data: Patient is seen and examined.  Patient is a 70 year old female with a reported past psychiatric history significant for bipolar disorder, anxiety disorder, depression who presented to the [facility] emergency department on 09/13/2020 after having been dropped off by her sister.  The patient initially stated that she had had anxiety for 2 weeks prior to admission and stated "I cannot take it anymore".  In the emergency department she was evaluated by the emergency room physician and stated that she had not been sleeping.  She stated she had not been sleeping well over the last 2 weeks.  Psychiatric consultation was obtained on 4/30.  She again reported that she was anxious and was not sleeping.  She had reported her depression was low to moderate without suicidal ideation.  She apparently is followed at [facility].  Initial consultation wrote for hydroxyzine and that the patient could be discharged.  This was on 4/30.  A comprehensive clinical assessment was done shortly after that and agreed that the patient could be discharged to home.  She then presented again on 09/16/2020.  She was dropped off by her sister again, and the patient stated she was suicidal and had been for the last 2 to 3 weeks.  She stated she was taking medication but apparently this was not effective.  The comprehensive  clinical assessment at that time on 5/3 reinforced that she was suicidal, had been that way for 2 to 3 weeks.  Reportedly she had not been psychiatrically hospitalized in the recent past, and is followed by a physician at [facility].  She reported not eating or sleeping, and reportedly lost approximately 16 pounds.  She was seen by psychiatric consultation on 09/19/2020.  At that time she continued to endorse depressive symptoms as well as suicidal ideations, occasional hallucinations, hopelessness as well as dysphoria.  It did note that she was "fully ambulatory" and that she had "no sign of dementia".  Unfortunately since she is arrived at our facility she has required a wheelchair.  That note stated that the patient had been receiving Depakote and Risperdal while in the [facility] emergency department.  Reportedly she had also recently moved into her own independent living situation which the evaluating psychiatrist thought was a major stressor.  On examination today the patient is not a very good historian.  She does not know any of her medications.  She is unable to remember whether or not she has been psychiatrically hospitalized in the last several years.  She stated that she was hospitalized several years ago.  She is alert and oriented x3, but only able to remember the president's to [facility].  She is in a wheelchair, and currently unable to ambulate.  She does admit to depression.  She was admitted to the hospital for evaluation and stabilization.  Review of the electronic medical record revealed an involuntary commitment from 12/03/2019.  She reportedly had manic and depressive symptoms at that time.  Her list of  medications at that point included Celexa 20 mg p.o. daily, Trileptal 300 mg p.o. twice daily and Risperdal 6 mg p.o. nightly.  A medical hospitalization from 05/21/2019 revealed that the patient was being sent to a skilled nursing facility.  Prior to admission her  medications included Cogentin, losartan, Seroquel and temazepam.  Her psychiatric medications at discharge included citalopram, gabapentin, Trileptal and Risperdal.  Review of the electronic medical record also revealed her last psychiatric hospitalization within our system was in August 2020.  She was admitted to [facility] at that time because of an agitated incident with aggression.  Her diagnosis at that time included bipolar disorder type I with mania.  Her discharge medications at that time included Cogentin, Risperdal.  She was transferred to our facility for evaluation and stabilization.  Continued Clinical Symptoms:  Alcohol Use Disorder Identification Test Final Score (AUDIT): 0 The "Alcohol Use Disorders Identification Test", Guidelines for Use in Primary Care, Second Edition.  World Science writer [facility]). Score between 0-7:  no or low risk or alcohol related problems. Score between 8-15:  moderate risk of alcohol related problems. Score between 16-19:  high risk of alcohol related problems. Score 20 or above:  warrants further diagnostic evaluation for alcohol dependence and treatment.   CLINICAL FACTORS:   Bipolar Disorder:   Depressive phase Depression:   Anhedonia Hopelessness Insomnia More than one psychiatric diagnosis   Musculoskeletal: Strength & Muscle Tone: decreased Gait & Station: unable to stand Patient leans: N/A  Psychiatric Specialty Exam:  Presentation  General Appearance: Disheveled  Eye Contact:Fair  Speech:Normal Rate  Speech Volume:Decreased  Handedness:Right   Mood and Affect  Mood:Anxious; Depressed  Affect:Blunt   Thought Process  Thought Processes:Goal Directed  Descriptions of Associations:Circumstantial  Orientation:Partial  Thought Content:Rumination; Scattered  History of Schizophrenia/Schizoaffective disorder:No  Duration of Psychotic Symptoms:No data recorded Hallucinations:Hallucinations:  None  Ideas of Reference:None  Suicidal Thoughts:Suicidal Thoughts: Yes, Active  Homicidal Thoughts:Homicidal Thoughts: No   Sensorium  Memory:Immediate Poor; Recent Poor; Remote Poor  Judgment:Impaired  Insight:Lacking   Executive Functions  Concentration:Fair  Attention Span:Fair  Recall:Poor  Fund of Knowledge:Fair  Language:Fair   Psychomotor Activity  Psychomotor Activity:Psychomotor Activity: Decreased   Assets  Assets:Desire for Improvement; Resilience   Sleep  Sleep:Sleep: Good Number of Hours of Sleep: 6.75    Physical Exam: Physical Exam Vitals and nursing note reviewed.  HENT:     Head: Normocephalic and atraumatic.  Pulmonary:     Effort: Pulmonary effort is normal.  Neurological:     Mental Status: She is alert.     Motor: Weakness present.    ROS Blood pressure (!) 133/53, pulse 63, temperature 98 F (36.7 C), temperature source Oral, resp. rate 18, height 5\' 1"  (1.549 m), weight 105.7 kg, SpO2 98 %. Body mass index is 44.02 kg/m.   COGNITIVE FEATURES THAT CONTRIBUTE TO RISK:  None    SUICIDE RISK:   Moderate:  Frequent suicidal ideation with limited intensity, and duration, some specificity in terms of plans, no associated intent, good self-control, limited dysphoria/symptomatology, some risk factors present, and identifiable protective factors, including available and accessible social support.  PLAN OF CARE: Patient is seen and examined.  Patient is a 70 year old female with a reported past psychiatric history significant for bipolar disorder type I, most recently depressed.  She was transferred to our facility for evaluation and stabilization.  On admission last night she was started on Cogentin 0.5 mg p.o. nightly and Risperdal 6 mg p.o. nightly.  These will be continued for now.  We will have to contact her sister for collateral information for confirmation of her medications.  We will also attempt to contact her primary pharmacy  again for current dosages of her medications.  Review of her admission laboratories revealed a glucose that was elevated at 159.  Creatinine was normal at 0.96.  Liver function enzymes were normal.  Lipid panel was essentially normal except for a significantly elevated triglyceride at 315.  Her CBC showed a mild elevation of her white blood cell count at 11.7.  Hemoglobin was 15.9 and hematocrit was 46.3.  The patient did admit that she continues to smoke at home.  Her platelets were slightly low normal at 192,000.  9 months ago they were 242,000.  Acetaminophen was less than 10, salicylate less than 7.  Hemoglobin A1c was 6.5.  TSH was 1.116.  Respiratory panel for influenza A, B and coronavirus were negative.  Blood alcohol was less than 10.  EKG appeared to suggest a previous myocardial infarction as well as an elevated QTc interval at approximately 450.  Her vital signs are stable, she is afebrile.  Pulse oximetry on room air was 98%.  Since the patient is arrived on the unit she is having a great deal of difficulty ambulating has remained in her bed and wheelchair for the majority of the time she has been here.  We will have to place her on a one-to-one, and as well write for physical therapy consultation.  It also appears that this may be a placement issue, but hopefully the sister will be able to provide [place] with additional information.  She did sleep 6.75 hours last night.  I certify that inpatient services furnished can reasonably be expected to improve the patient's condition.   Antonieta Pert, MD 09/20/2020, 10:03 AM

## 2020-09-20 NOTE — [facility] Group Notes (Signed)
.  Psychoeducational Group Note  Date: 09/20/2020 Time: 0900-1000    Goal Setting   Purpose of Group: This group helps to provide patients with the steps of setting a goal that is specific, measurable, attainable, realistic and time specific. A discussion on how we keep ourselves stuck with negative self talk.    Participation Level:  Did not attend   Julie Morrow A  

## 2020-09-20 NOTE — Tx Team (Signed)
Initial Treatment Plan 09/20/2020 2:15 AM Julie Morrow [id number]    PATIENT STRESSORS: Health problems Occupational concerns Other: living by myself and feeling home bound   PATIENT STRENGTHS: Ability for insight Capable of independent living Communication skills Financial means Supportive family/friends   PATIENT IDENTIFIED PROBLEMS:    " Got really depressed and staying in the bed a lot"   "suicidal thoughts for a week"                 DISCHARGE CRITERIA:  Adequate post-discharge living arrangements Improved stabilization in mood, thinking, and/or behavior Reduction of life-threatening or endangering symptoms to within safe limits  PRELIMINARY DISCHARGE PLAN: Outpatient therapy Return to previous living arrangement  PATIENT/FAMILY INVOLVEMENT: This treatment plan has been presented to and reviewed with the patient, Julie Morrow, and/or family member, .  The patient and family have been given the opportunity to ask questions and make suggestions.  Andres Ege, RN 09/20/2020, 2:15 AM

## 2020-09-20 NOTE — Progress Notes (Signed)
D. Pt presents with a sad affect- depressed mood- calm, cooperative, somewhat guarded behavior. Pt stayed in her room for much of the shift. Initially, this am, pt appeared as if she would need assistance with her ADL's, but with some encouragement pt was able to take a shower and dress herself. Pt was given a walker for safety, due to pt's complaints of weakness. Pt did come to the dayroom for a brief time this afternoon, but returned to her room to lay down. Pt currently denies SI/HI and AVH.  A. Labs and vitals monitored. Pt given and educated on medications. Pt supported emotionally and encouraged to express concerns and ask questions.   R. Pt remains safe with 15 minute checks. Will continue POC.

## 2020-09-20 NOTE — [facility] Group Notes (Signed)
.  Psychoeducational Group Note    Date:09/20/2020 Time: 1300-1400    Life Skills:  A group where two lists are made. What people need and what are things that we do that are healthy. The lists are developed by the patients and it is explained that we often do the actions that are not healthy to get our list of needs met.   Purpose of Group: . The group focus' on teaching patients on how to identify their needs and how to develop the coping skills needed to get their needs met  Participation Level:  Did not attend   Julie Morrow A   

## 2020-09-20 NOTE — H&P (Signed)
Psychiatric Admission Assessment Adult  Patient Identification: Julie Morrow MRN:  [id number] Date of Evaluation:  09/20/2020 Chief Complaint:  Bipolar disorder, mixed (HCC) [F31.60] Principal Diagnosis: <principal problem not specified> Diagnosis:  Active Problems:   Bipolar disorder, mixed (HCC)  History of Present Illness: Patient is seen and examined.  Patient is a 70 year old female with a reported past psychiatric history significant for bipolar disorder, anxiety disorder, depression who presented to the [facility] emergency department on 09/13/2020 after having been dropped off by her sister.  The patient initially stated that she had had anxiety for 2 weeks prior to admission and stated "I cannot take it anymore".  In the emergency department she was evaluated by the emergency room physician and stated that she had not been sleeping.  She stated she had not been sleeping well over the last 2 weeks.  Psychiatric consultation was obtained on 4/30.  She again reported that she was anxious and was not sleeping.  She had reported her depression was low to moderate without suicidal ideation.  She apparently is followed at [facility].  Initial consultation wrote for hydroxyzine and that the patient could be discharged.  This was on 4/30.  A comprehensive clinical assessment was done shortly after that and agreed that the patient could be discharged to home.  She then presented again on 09/16/2020.  She was dropped off by her sister again, and the patient stated she was suicidal and had been for the last 2 to 3 weeks.  She stated she was taking medication but apparently this was not effective.  The comprehensive clinical assessment at that time on 5/3 reinforced that she was suicidal, had been that way for 2 to 3 weeks.  Reportedly she had not been psychiatrically hospitalized in the recent past, and is followed by a physician [facility].  She reported not eating or sleeping,  and reportedly lost approximately 16 pounds.  She was seen by psychiatric consultation on 09/19/2020.  At that time she continued to endorse depressive symptoms as well as suicidal ideations, occasional hallucinations, hopelessness as well as dysphoria.  It did note that she was "fully ambulatory" and that she had "no sign of dementia".  Unfortunately since she is arrived at our facility she has required a wheelchair.  That note stated that the patient had been receiving Depakote and Risperdal while in the [facility] emergency department.  Reportedly she had also recently moved into her own independent living situation which the evaluating psychiatrist thought was a major stressor.  On examination today the patient is not a very good historian.  She does not know any of her medications.  She is unable to remember whether or not she has been psychiatrically hospitalized in the last several years.  She stated that she was hospitalized several years ago.  She is alert and oriented x3, but only able to remember the president's to [facility].  She is in a wheelchair, and currently unable to ambulate.  She does admit to depression.  She was admitted to the hospital for evaluation and stabilization.  Review of the electronic medical record revealed an involuntary commitment from 12/03/2019.  She reportedly had manic and depressive symptoms at that time.  Her list of medications at that point included Celexa 20 mg p.o. daily, Trileptal 300 mg p.o. twice daily and Risperdal 6 mg p.o. nightly.  A medical hospitalization from 05/21/2019 revealed that the patient was being sent to a skilled nursing facility.  Prior to admission her medications included Cogentin, losartan, Seroquel and temazepam.  Her psychiatric medications at discharge included citalopram, gabapentin, Trileptal and Risperdal.  Review of the electronic medical record also revealed her last psychiatric hospitalization within our system was in August  2020.  She was admitted to [facility] at that time because of an agitated incident with aggression.  Her diagnosis at that time included bipolar disorder type I with mania.  Her discharge medications at that time included Cogentin, Risperdal.  She was transferred to our facility for evaluation and stabilization.  Associated Signs/Symptoms: Depression Symptoms:  depressed mood, anhedonia, insomnia, psychomotor agitation, fatigue, feelings of worthlessness/guilt, difficulty concentrating, hopelessness, suicidal thoughts without plan, anxiety, loss of energy/fatigue, disturbed sleep, Duration of Depression Symptoms: Greater than two weeks  (Hypo) Manic Symptoms:  Impulsivity, Irritable Mood, Labiality of Mood, Anxiety Symptoms:  Excessive Worry, Psychotic Symptoms:  Negative PTSD Symptoms: Negative Total Time spent with patient: 45 minutes  Past Psychiatric History: Review of the electronic medical record revealed an involuntary commitment from 12/03/2019.  She was reportedly manic at that time and also having depressive symptoms.  Review of the electronic medical record also revealed that the patient been treated with Cogentin, Seroquel, temazepam, citalopram, gabapentin, Trileptal as well as Risperdal in the past.  Is the patient at risk to self? Yes.    Has the patient been a risk to self in the past 6 months? Yes.    Has the patient been a risk to self within the distant past? Yes.    Is the patient a risk to others? No.  Has the patient been a risk to others in the past 6 months? No.  Has the patient been a risk to others within the distant past? No.   Prior Inpatient Therapy:   Prior Outpatient Therapy:    Alcohol Screening: Patient refused Alcohol Screening Tool: Yes 1. How often do you have a drink containing alcohol?: Never 2. How many drinks containing alcohol do you have on a typical day when you are drinking?: 1 or 2 3. How often do you have six  or more drinks on one occasion?: Never AUDIT-C Score: 0 4. How often during the last year have you found that you were not able to stop drinking once you had started?: Never 5. How often during the last year have you failed to do what was normally expected from you because of drinking?: Never 6. How often during the last year have you needed a first drink in the morning to get yourself going after a heavy drinking session?: Never 7. How often during the last year have you had a feeling of guilt of remorse after drinking?: Never 8. How often during the last year have you been unable to remember what happened the night before because you had been drinking?: Never 9. Have you or someone else been injured as a result of your drinking?: No 10. Has a relative or friend or a doctor or another health worker been concerned about your drinking or suggested you cut down?: No Alcohol Use Disorder Identification Test Final Score (AUDIT): 0 Substance Abuse History in the last 12 months:  No. Consequences of Substance Abuse: Negative Previous Psychotropic Medications: Yes  Psychological Evaluations: Yes  Past Medical History:  Past Medical History:  Diagnosis Date  . Anxiety   . Bipolar 1 disorder (HCC)   . Bradycardia   . Depression   . Diabetes mellitus, type II [facility])    Patient takes  Glucotrol and Januvia  . Hypothyroidism   . Lithium toxicity     Past Surgical History:  Procedure Laterality Date  . ABDOMINAL HYSTERECTOMY    . CHOLECYSTECTOMY     Family History:  Family History  Problem Relation Age of Onset  . Dementia Mother   . Arthritis Father    Family Psychiatric  History: Unknown Tobacco Screening: Have you used any form of tobacco in the last 30 days? (Cigarettes, Smokeless Tobacco, Cigars, and/or Pipes): Yes Tobacco use, Select all that apply: 5 or more cigarettes per day Are you interested in Tobacco Cessation Medications?: Yes, will notify MD for an order Counseled patient on  smoking cessation including recognizing danger situations, developing coping skills and basic information about quitting provided: Refused/Declined practical counseling Social History:  Social History   Substance and Sexual Activity  Alcohol Use No  . Alcohol/week: 0.0 standard drinks     Social History   Substance and Sexual Activity  Drug Use No    Additional Social History:                           Allergies:  No Known Allergies Lab Results:  Results for orders placed or performed during the hospital encounter of 09/16/20 (from the past 48 hour(s))  Resp Panel by RT-PCR (Flu A&B, Covid) Nasopharyngeal Swab     Status: None   Collection Time: 09/19/20  2:30 PM   Specimen: Nasopharyngeal Swab; Nasopharyngeal(NP) swabs in vial transport medium  Result Value Ref Range   SARS Coronavirus 2 by RT PCR NEGATIVE NEGATIVE    Comment: (NOTE) SARS-CoV-2 target nucleic acids are NOT DETECTED.  The SARS-CoV-2 RNA is generally detectable in upper respiratory specimens during the acute phase of infection. The lowest concentration of SARS-CoV-2 viral copies this assay can detect is 138 copies/mL. A negative result does not preclude SARS-Cov-2 infection and should not be used as the sole basis for treatment or other patient management decisions. A negative result may occur with  improper specimen collection/handling, submission of specimen other than nasopharyngeal swab, presence of viral mutation(s) within the areas targeted by this assay, and inadequate number of viral copies(<138 copies/mL). A negative result must be combined with clinical observations, patient history, and epidemiological information. The expected result is Negative.  Fact Sheet for Patients:  BloggerCourse.com  Fact Sheet for Healthcare Providers:  SeriousBroker.it  This test is no t yet approved or cleared by the [place] FDA and  has been  authorized for detection and/or diagnosis of SARS-CoV-2 by FDA under an Emergency Use Authorization (EUA). This EUA will remain  in effect (meaning this test can be used) for the duration of the COVID-19 declaration under Section 564(b)(1) of the Act, 21 U.S.C.section 360bbb-3(b)(1), unless the authorization is terminated  or revoked sooner.       Influenza A by PCR NEGATIVE NEGATIVE   Influenza B by PCR NEGATIVE NEGATIVE    Comment: (NOTE) The Xpert Xpress SARS-CoV-2/FLU/RSV plus assay is intended as an aid in the diagnosis of influenza from Nasopharyngeal swab specimens and should not be used as a sole basis for treatment. Nasal washings and aspirates are unacceptable for Xpert Xpress SARS-CoV-2/FLU/RSV testing.  Fact Sheet for Patients: BloggerCourse.com  Fact Sheet for Healthcare Providers: SeriousBroker.it  This test is not yet approved or cleared by the [place] FDA and has been authorized for detection and/or diagnosis of SARS-CoV-2 by FDA under an Emergency Use Authorization (EUA). This EUA will  remain in effect (meaning this test can be used) for the duration of the COVID-19 declaration under Section 564(b)(1) of the Act, 21 U.S.C. section 360bbb-3(b)(1), unless the authorization is terminated or revoked.  Performed at [facility], [address] [place], [place] [address]     Blood Alcohol level:  Lab Results  Component Value Date   [facility] <10 09/16/2020   ETH <10 12/03/2019    Metabolic Disorder Labs:  Lab Results  Component Value Date   HGBA1C 6.5 (H) 09/16/2020   MPG 139.85 09/16/2020   MPG 128.37 12/28/2018   Lab Results  Component Value Date   PROLACTIN 15.1 07/03/2015   Lab Results  Component Value Date   CHOL 106 09/16/2020   TRIG 315 (H) 09/16/2020   HDL 28 (L) 09/16/2020   CHOLHDL 3.8 09/16/2020   VLDL 63 (H) 09/16/2020   LDLCALC 15 09/16/2020   LDLCALC 38 12/28/2018     Current Medications: Current Facility-Administered Medications  Medication Dose Route Frequency Provider Last Rate Last Admin  . acetaminophen (TYLENOL) tablet 650 mg  650 mg Oral Q6H PRN Clapacs, Jackquline Denmark, MD   650 mg at 09/19/20 2322  . alum & mag hydroxide-simeth (MAALOX/MYLANTA) 200-200-20 MG/5ML suspension 30 mL  30 mL Oral Q4H PRN Clapacs, John T, MD      . aspirin EC tablet 81 mg  81 mg Oral Daily Clapacs, Jackquline Denmark, MD   81 mg at 09/20/20 0936  . benztropine (COGENTIN) tablet 0.5 mg  0.5 mg Oral QHS Clapacs, John T, MD   0.5 mg at 09/19/20 2322  . fenofibrate tablet 160 mg  160 mg Oral Daily Clapacs, Jackquline Denmark, MD   160 mg at 09/20/20 0936  . glipiZIDE (GLUCOTROL) tablet 10 mg  10 mg Oral BID AC Clapacs, Jackquline Denmark, MD   10 mg at 09/20/20 6045  . levothyroxine (SYNTHROID) tablet 200 mcg  200 mcg Oral Q0600 Clapacs, Jackquline Denmark, MD   200 mcg at 09/20/20 [phone]  . loratadine (CLARITIN) tablet 10 mg  10 mg Oral Daily Clapacs, Jackquline Denmark, MD   10 mg at 09/20/20 0936  . magnesium hydroxide (MILK OF MAGNESIA) suspension 30 mL  30 mL Oral Daily PRN Clapacs, John T, MD      . metFORMIN (GLUCOPHAGE) tablet 500 mg  500 mg Oral Q breakfast Clapacs, Jackquline Denmark, MD   500 mg at 09/20/20 0936  . risperiDONE (RISPERDAL) tablet 6 mg  6 mg Oral QHS Clapacs, John T, MD   6 mg at 09/19/20 2322   PTA Medications: Medications Prior to Admission  Medication Sig Dispense Refill Last Dose  . aspirin 81 MG chewable tablet Chew 1 tablet (81 mg total) by mouth daily. (Patient not taking: No sig reported) 30 tablet 1   . CINNAMON PO Take 1 tablet by mouth daily.     . divalproex (DEPAKOTE ER) 250 MG 24 hr tablet Take 750 mg by mouth at bedtime.     . fenofibrate 160 MG tablet Take 1 tablet (160 mg total) by mouth daily with breakfast. (Patient not taking: No sig reported) 30 tablet 1   . FEROSUL 325 (65 Fe) MG tablet Take 325 mg by mouth daily.     . hydrOXYzine (ATARAX/VISTARIL) 10 MG tablet Take 10 mg by mouth 3 (three) times daily  as needed. Take 10 mg by mouth three times daily as needed for anxiety and 30 mg at bedtime as needed for sleep     . levothyroxine (  SYNTHROID) 175 MCG tablet Take 175 mcg by mouth daily.     Marland Kitchen levothyroxine (SYNTHROID) 200 MCG tablet Take 1 tablet (200 mcg total) by mouth daily before breakfast. (Patient not taking: No sig reported) 30 tablet 1   . loratadine (CLARITIN) 10 MG tablet Take 1 tablet (10 mg total) by mouth daily. (Patient not taking: No sig reported) 30 tablet 1   . losartan (COZAAR) 25 MG tablet Take 25 mg by mouth daily.     . melatonin 5 MG TABS Take 5-10 mg by mouth at bedtime.     . metFORMIN (GLUCOPHAGE) 500 MG tablet Take 1 tablet (500 mg total) by mouth daily with breakfast. (Patient not taking: No sig reported) 30 tablet 1   . Multiple Vitamins-Minerals (CENTRUM) tablet Take 1 tablet by mouth daily.     . polyvinyl alcohol (LIQUIFILM TEARS) 1.4 % ophthalmic solution Place 1 drop into both eyes as needed for dry eyes.     . Probiotic Product (PROBIOTIC PO) Take 1 capsule by mouth daily.     . risperiDONE (RISPERDAL) 3 MG tablet Take 2 tablets (6 mg total) by mouth at bedtime. (Patient not taking: No sig reported) 60 tablet 1   . rosuvastatin (CRESTOR) 40 MG tablet Take 40 mg by mouth daily.     . RYBELSUS 14 MG TABS Take 1 tablet by mouth every morning.     . traZODone (DESYREL) 50 MG tablet Take 50 mg by mouth at bedtime as needed for sleep.       Musculoskeletal: Strength & Muscle Tone: decreased Gait & Station: unsteady Patient leans: N/A            Psychiatric Specialty Exam:  Presentation  General Appearance: Disheveled  Eye Contact:Fair  Speech:Normal Rate  Speech Volume:Decreased  Handedness:Right   Mood and Affect  Mood:Anxious; Depressed  Affect:Blunt   Thought Process  Thought Processes:Goal Directed  Duration of Psychotic Symptoms: No data recorded Past Diagnosis of Schizophrenia or Psychoactive disorder: No  Descriptions of  Associations:Circumstantial  Orientation:Partial  Thought Content:Rumination; Scattered  Hallucinations:Hallucinations: None  Ideas of Reference:None  Suicidal Thoughts:Suicidal Thoughts: Yes, Active  Homicidal Thoughts:Homicidal Thoughts: No   Sensorium  Memory:Immediate Poor; Recent Poor; Remote Poor  Judgment:Impaired  Insight:Lacking   Executive Functions  Concentration:Fair  Attention Span:Fair  Recall:Poor  Fund of Knowledge:Fair  Language:Fair   Psychomotor Activity  Psychomotor Activity:Psychomotor Activity: Decreased   Assets  Assets:Desire for Improvement; Resilience   Sleep  Sleep:Sleep: Good Number of Hours of Sleep: 6.75    Physical Exam: Physical Exam Vitals and nursing note reviewed.  HENT:     Head: Normocephalic and atraumatic.  Pulmonary:     Effort: Pulmonary effort is normal.  Neurological:     General: No focal deficit present.     Mental Status: She is alert.    ROS Blood pressure (!) 133/53, pulse 63, temperature 98 F (36.7 C), temperature source Oral, resp. rate 18, height  (1.549 m), weight 105.7 kg, SpO2 98 %. Body mass index is 44.02 kg/m.  Treatment Plan Summary: Daily contact with patient to assess and evaluate symptoms and progress in treatment, Medication management and Plan : Patient is seen and examined.  Patient is a 70 year old female with a reported past psychiatric history significant for bipolar disorder type I, most recently depressed.  She was transferred to our facility for evaluation and stabilization.  On admission last night she was started on Cogentin 0.5 mg p.o. nightly and Risperdal 6 mg p.o.  nightly.  These will be continued for now.  We will have to contact her sister for collateral information for confirmation of her medications.  We will also attempt to contact her primary pharmacy again for current dosages of her medications.  Review of her admission laboratories revealed a glucose that was  elevated at 159.  Creatinine was normal at 0.96.  Liver function enzymes were normal.  Lipid panel was essentially normal except for a significantly elevated triglyceride at 315.  Her CBC showed a mild elevation of her white blood cell count at 11.7.  Hemoglobin was 15.9 and hematocrit was 46.3.  The patient did admit that she continues to smoke at home.  Her platelets were slightly low normal at 192,000.  9 months ago they were 242,000.  Acetaminophen was less than 10, salicylate less than 7.  Hemoglobin A1c was 6.5.  TSH was 1.116.  Respiratory panel for influenza A, B and coronavirus were negative.  Blood alcohol was less than 10.  EKG appeared to suggest a previous myocardial infarction as well as an elevated QTc interval at approximately 450.  Her vital signs are stable, she is afebrile.  Pulse oximetry on room air was 98%.  Since the patient is arrived on the unit she is having a great deal of difficulty ambulating has remained in her bed and wheelchair for the majority of the time she has been here.  We will have to place her on a one-to-one, and as well write for physical therapy consultation.  It also appears that this may be a placement issue, but hopefully the sister will be able to provide us with additional information.  She did sleep 6.75 hours last night.  Observation Level/Precautions:  15 minute checks  Laboratory:  Chemistry Profile  Psychotherapy:    Medications:    Consultations:    Discharge Concerns:    Estimated LOS:  Other:     Physician Treatment Plan for Primary Diagnosis: <principal problem not specified> Long Term Goal(s): Improvement in symptoms so as ready for discharge  Short Term Goals: Ability to identify changes in lifestyle to reduce recurrence of condition will improve, Ability to verbalize feelings will improve, Ability to disclose and discuss suicidal ideas, Ability to demonstrate self-control will improve, Ability to identify and develop effective coping behaviors  will improve, Ability to maintain clinical measurements within normal limits will improve and Compliance with prescribed medications will improve  Physician Treatment Plan for Secondary Diagnosis: Active Problems:   Bipolar disorder, mixed (HCC)  Long Term Goal(s): Improvement in symptoms so as ready for discharge  Short Term Goals: Ability to identify changes in lifestyle to reduce recurrence of condition will improve, Ability to verbalize feelings will improve, Ability to disclose and discuss suicidal ideas, Ability to demonstrate self-control will improve, Ability to identify and develop effective coping behaviors will improve, Ability to maintain clinical measurements within normal limits will improve and Compliance with prescribed medications will improve  I certify that inpatient services furnished can reasonably be expected to improve the patient's condition.    Antonieta PertGreg Lawson Anakin Varkey, MD 5/7/20223:21 PM

## 2020-09-21 DIAGNOSIS — F3160 Bipolar disorder, current episode mixed, unspecified: Secondary | ICD-10-CM | POA: Diagnosis not present

## 2020-09-21 LAB — GLUCOSE, CAPILLARY
Glucose-Capillary: 61 mg/dL — ABNORMAL LOW (ref 70–99)
Glucose-Capillary: 90 mg/dL (ref 70–99)
Glucose-Capillary: 140 mg/dL — ABNORMAL HIGH (ref 70–99)
Glucose-Capillary: 101 mg/dL — ABNORMAL HIGH (ref 70–99)

## 2020-09-21 MED ORDER — BUPROPION HCL 75 MG PO TABS
75.0000 mg | ORAL_TABLET | Freq: Every day | ORAL | Status: DC
  Administered 2020-09-21 – 2020-09-22 (×2): 75 mg via ORAL
  Filled 2020-09-21 (×4): qty 1

## 2020-09-21 MED ORDER — NICOTINE 14 MG/24HR TD PT24
14.0000 mg | MEDICATED_PATCH | Freq: Every day | TRANSDERMAL | Status: DC
  Administered 2020-09-21 – 2020-10-07 (×17): 14 mg via TRANSDERMAL
  Filled 2020-09-21 (×19): qty 1

## 2020-09-21 MED ORDER — GLUCOSE 4 G PO CHEW
CHEWABLE_TABLET | ORAL | Status: AC
  Filled 2020-09-21: qty 2

## 2020-09-21 MED ORDER — GLIPIZIDE 5 MG PO TABS
5.0000 mg | ORAL_TABLET | Freq: Two times a day (BID) | ORAL | Status: DC
  Administered 2020-09-21 – 2020-09-22 (×2): 5 mg via ORAL
  Filled 2020-09-21 (×4): qty 1

## 2020-09-21 MED ORDER — NICOTINE 14 MG/24HR TD PT24
MEDICATED_PATCH | TRANSDERMAL | Status: AC
  Filled 2020-09-21: qty 1

## 2020-09-21 NOTE — Progress Notes (Signed)
[facility] MD Progress Note  09/21/2020 11:25 AM Julie Morrow  MRN:  [id number] Subjective: Patient is a 70 year old female with a reported past psychiatric history significant for bipolar disorder, anxiety disorder, depression who originally presented to the [facility] emergency department on 09/13/2020 after having been dropped off by her sister.  The patient's original complaint was "I cannot take it anymore".  Objective: Patient is seen and examined.  Patient is a 70 year old female with the above-stated past psychiatric history who is seen in follow-up.  She looks a little bit better today, but stated that her depression is still a 9 out of 10 with 10 being the worst.  Review of the nursing notes from yesterday showed that she was able to get out of bed with a walker, and was able to dress herself as well as take a shower.  She seemed more awake and alert today.  She still does not recall any of her medications.  I discussed with her what I had seen in the old notes with where she had apparently been medically hospitalized and then sent to a skilled nursing facility.  As per Dr. Jacques Earthly note it stated that she had somehow or another fairly recently moved from a skilled nursing facility into independent living.  So far we do not have any collateral information from the sister.  She remains alert and oriented x3.  She has been given a walker instead of the wheelchair and apparently has been getting around okay.  Occupational Therapy is to see her tomorrow.  She stated that she still having suicidal thoughts.  Her vital signs are stable, she is afebrile.  Pulse oximetry on room air was 97%.  She slept 6.75 hours last night.  Unfortunately her blood sugar did drop last night, and I have gone on and cut her glipizide back to 5 mg p.o. twice daily.  I did not change her metformin dosage at this point.  No new laboratories.  Principal Problem: <principal problem not specified> Diagnosis:  Active Problems:   Bipolar disorder, mixed (HCC)  Total Time spent with patient: 20 minutes  Past Psychiatric History: See admission H&P  Past Medical History:  Past Medical History:  Diagnosis Date  . Anxiety   . Bipolar 1 disorder (HCC)   . Bradycardia   . Depression   . Diabetes mellitus, type II (HCC)    Patient takes Glucotrol and Januvia  . Hypothyroidism   . Lithium toxicity     Past Surgical History:  Procedure Laterality Date  . ABDOMINAL HYSTERECTOMY    . CHOLECYSTECTOMY     Family History:  Family History  Problem Relation Age of Onset  . Dementia Mother   . Arthritis Father    Family Psychiatric  History: See admission H&P Social History:  Social History   Substance and Sexual Activity  Alcohol Use No  . Alcohol/week: 0.0 standard drinks     Social History   Substance and Sexual Activity  Drug Use No    Social History   Socioeconomic History  . Marital status: Single    Spouse name: Not on file  . Number of children: Not on file  . Years of education: Not on file  . Highest education level: Not on file  Occupational History  . Not on file  Tobacco Use  . Smoking status: Current Every Day Smoker    Packs/day: 0.50    Years: 20.00    Pack years: 10.00    Types:  Cigarettes    Start date: 11/06/1984  . Smokeless tobacco: Never Used  Vaping Use  . Vaping Use: Never used  Substance and Sexual Activity  . Alcohol use: No    Alcohol/week: 0.0 standard drinks  . Drug use: No  . Sexual activity: Not Currently  Other Topics Concern  . Not on file  Social History Narrative  . Not on file   Social Determinants of Health   Financial Resource Strain: Not on file  Food Insecurity: Not on file  Transportation Needs: Not on file  Physical Activity: Not on file  Stress: Not on file  Social Connections: Not on file   Additional Social History:                         Sleep: Good  Appetite:  Fair  Current Medications: Current  Facility-Administered Medications  Medication Dose Route Frequency Provider Last Rate Last Admin  . acetaminophen (TYLENOL) tablet 650 mg  650 mg Oral Q6H PRN Clapacs, Jackquline DenmarkJohn T, MD   650 mg at 09/21/20 1102  . alum & mag hydroxide-simeth (MAALOX/MYLANTA) 200-200-20 MG/5ML suspension 30 mL  30 mL Oral Q4H PRN Clapacs, John T, MD      . aspirin EC tablet 81 mg  81 mg Oral Daily Clapacs, Jackquline DenmarkJohn T, MD   81 mg at 09/21/20 0814  . benztropine (COGENTIN) tablet 0.5 mg  0.5 mg Oral QHS Clapacs, John T, MD   0.5 mg at 09/20/20 2107  . buPROPion [facility](WELLBUTRIN) tablet 75 mg  75 mg Oral Daily Antonieta Pertlary, Courtnie Brenes Lawson, MD   75 mg at 09/21/20 1056  . fenofibrate tablet 160 mg  160 mg Oral Daily Clapacs, Jackquline DenmarkJohn T, MD   160 mg at 09/21/20 0814  . glipiZIDE (GLUCOTROL) tablet 5 mg  5 mg Oral BID AC Brandee Markin, Marlane MingleGreg Lawson, MD      . glucose 4 GM chewable tablet           . levothyroxine (SYNTHROID) tablet 200 mcg  200 mcg Oral Q0600 Clapacs, Jackquline DenmarkJohn T, MD   200 mcg at 09/21/20 0636  . loratadine (CLARITIN) tablet 10 mg  10 mg Oral Daily Clapacs, Jackquline DenmarkJohn T, MD   10 mg at 09/21/20 0814  . magnesium hydroxide (MILK OF MAGNESIA) suspension 30 mL  30 mL Oral Daily PRN Clapacs, John T, MD      . metFORMIN (GLUCOPHAGE) tablet 500 mg  500 mg Oral Q breakfast Clapacs, Jackquline DenmarkJohn T, MD   500 mg at 09/21/20 0814  . risperiDONE (RISPERDAL) tablet 6 mg  6 mg Oral QHS Clapacs, John T, MD   6 mg at 09/20/20 2107    Lab Results:  Results for orders placed or performed during the hospital encounter of 09/19/20 (from the past 48 hour(s))  Glucose, capillary     Status: Abnormal   Collection Time: 09/21/20  6:10 AM  Result Value Ref Range   Glucose-Capillary 61 (L) 70 - 99 mg/dL    Comment: Glucose reference range applies only to samples taken after fasting for at least 8 hours.  Glucose, capillary     Status: None   Collection Time: 09/21/20  6:48 AM  Result Value Ref Range   Glucose-Capillary 90 70 - 99 mg/dL    Comment: Glucose reference range applies  only to samples taken after fasting for at least 8 hours.    Blood Alcohol level:  Lab Results  Component Value Date   ETH <10 09/16/2020  ETH <10 12/03/2019    Metabolic Disorder Labs: Lab Results  Component Value Date   HGBA1C 6.5 (H) 09/16/2020   MPG 139.85 09/16/2020   MPG 128.37 12/28/2018   Lab Results  Component Value Date   PROLACTIN 15.1 07/03/2015   Lab Results  Component Value Date   CHOL 106 09/16/2020   TRIG 315 (H) 09/16/2020   HDL 28 (L) 09/16/2020   CHOLHDL 3.8 09/16/2020   VLDL 63 (H) 09/16/2020   LDLCALC 15 09/16/2020   LDLCALC 38 12/28/2018    Physical Findings: AIMS: Facial and Oral Movements Muscles of Facial Expression: None, normal Lips and Perioral Area: None, normal Jaw: None, normal Tongue: None, normal,Extremity Movements Upper (arms, wrists, hands, fingers): None, normal Lower (legs, knees, ankles, toes): None, normal, Trunk Movements Neck, shoulders, hips: None, normal, Overall Severity Severity of abnormal movements (highest score from questions above): None, normal Incapacitation due to abnormal movements: None, normal Patient's awareness of abnormal movements (rate only patient's report): No Awareness, Dental Status Current problems with teeth and/or dentures?: No Does patient usually wear dentures?: No  CIWA:    COWS:     Musculoskeletal: Strength & Muscle Tone: decreased Gait & Station: unsteady Patient leans: N/A  Psychiatric Specialty Exam:  Presentation  General Appearance: Fairly Groomed  Eye Contact:Fair  Speech:Normal Rate  Speech Volume:Normal  Handedness:Right   Mood and Affect  Mood:Anxious; Depressed  Affect:Blunt   Thought Process  Thought Processes:Goal Directed  Descriptions of Associations:Circumstantial  Orientation:Full (Time, Place and Person)  Thought Content:Rumination  History of Schizophrenia/Schizoaffective disorder:No  Duration of Psychotic Symptoms:No data  recorded Hallucinations:Hallucinations: None  Ideas of Reference:None  Suicidal Thoughts:Suicidal Thoughts: Yes, Passive SI Active Intent and/or Plan: Without Intent  Homicidal Thoughts:Homicidal Thoughts: No   Sensorium  Memory:Immediate Fair; Recent Fair; Remote Fair  Judgment:Fair  Insight:Fair   Executive Functions  Concentration:Fair  Attention Span:Fair  Recall:Poor  Fund of Knowledge:Fair  Language:Fair   Psychomotor Activity  Psychomotor Activity:Psychomotor Activity: Decreased   Assets  Assets:Desire for Improvement; Resilience; Social Support   Sleep  Sleep:Sleep: Good Number of Hours of Sleep: 6.75    Physical Exam: Physical Exam Vitals and nursing note reviewed.  Constitutional:      Appearance: Normal appearance.  HENT:     Head: Normocephalic and atraumatic.  Pulmonary:     Effort: Pulmonary effort is normal.  Neurological:     Mental Status: She is alert and oriented to person, place, and time.    ROS Blood pressure (!) 133/53, pulse 63, temperature 98 F (36.7 C), temperature source Oral, resp. rate 18, height 5\' 1"  (1.549 m), weight 105.7 kg, SpO2 98 %. Body mass index is 44.02 kg/m.   Treatment Plan Summary: Daily contact with patient to assess and evaluate symptoms and progress in treatment, Medication management and Plan : Patient is seen and examined.  Patient is a 70 year old female with the above-stated past psychiatric history who is seen in follow-up.   Diagnosis: 1.  Bipolar disorder, most recently depressed 2.  Type 2 diabetes mellitus 3.  Hypothyroidism 4.  Mild cognitive impairment  Pertinent findings on examination today: 1.  Patient remains depressed. 2.  Patient continued to endorse passive suicidal ideation. 3.  Patient suffered an episode of hypoglycemia early this a.m. 4.  She has been able to use a walker versus a wheelchair. 5.  Vital signs are stable and she is afebrile.  Plan: 1.  Check blood  sugars 3 times daily until there is some degree of  stability. 2.  Reduce glipizide to 5 mg p.o. twice daily for diabetes mellitus type 2 3.  Continue Cogentin 0.5 mg p.o. nightly for side effects of medications. 4.  Add short acting Wellbutrin 75 mg p.o. daily for depression.  Continue to monitor for possibility of flipping into a manic phase. 5.  Continue Synthroid 200 mcg p.o. daily for hypothyroidism. 6.  Continue Claritin 10 mg p.o. daily for seasonal allergies. 7.  Continue metformin 500 mg p.o. daily for diabetes mellitus type 2. 8.  Continue Risperdal 6 mg p.o. nightly for mood stability and sleep. 9.  Occupational therapy evaluation and treatment consultation pending 10.  Disposition planning-in progress.  Antonieta Pert, MD 09/21/2020, 11:25 AM

## 2020-09-21 NOTE — [facility] Suicide Risk Assessment (Signed)
[facility] INPATIENT:  Family/Significant Other Suicide Prevention Education  Suicide Prevention Education:  Contact Attempts:  sister Tilman Neat [phone], (name of family member/significant other) has been identified by the patient as the family member/significant other with whom the patient will be residing, and identified as the person(s) who will aid the patient in the event of a mental health crisis.  With written consent from the patient, two attempts were made to provide suicide prevention education, prior to and/or following the patient's discharge.  We were unsuccessful in providing suicide prevention education.  A suicide education pamphlet was given to the patient to share with family/significant other.  Date and time of first attempt:  09/21/2020  /  11:15am - phone kept ringing, could not leave voicemail Date and time of second attempt:  CSW team to continue efforts to reach  Lynnell Chad 09/21/2020, 3:40 PM

## 2020-09-21 NOTE — Progress Notes (Signed)
Psychoeducational Group Note  Date:  09/21/2020 Time: 2015 Group Topic/Focus:  wrap up group  Participation Level: Did Not Attend  Participation Quality:  Not Applicable  Affect:  Not Applicable  Cognitive:  Not Applicable  Insight:  Not Applicable  Engagement in Group: Not Applicable  Additional Comments:  Did not attend.   Marcille Buffy 09/21/2020, 9:12 PM

## 2020-09-21 NOTE — Progress Notes (Signed)
D. Pt continues to present with a flat affect/ depressed mood, attended group this afternoon. Pt continues to be able to ambulate with the walker without difficulty. Pt currently denies SI/HI and AVH  A. Labs and vitals monitored. Pt administered scheduled meds and prn Tylenol for chronic leg pain. Pt supported emotionally and encouraged to express concerns and ask questions.   R. Pt remains safe with 15 minute checks. Will continue POC.

## 2020-09-21 NOTE — [facility] Group Notes (Signed)
Adult Psychoeducational Group Not Date:  09/21/2020 Time:  0900-1045 Group Topic/Focus: PROGRESSIVE RELAXATION. A group where deep breathing is taught and tensing and relaxation muscle groups is used. Imagery is used as well.  Pts are asked to imagine 3 pillars that hold them up when they are not able to hold themselves up.  Participation Level:  Active  Participation Quality:  Appropriate  Affect:  Appropriate  Cognitive:  Oriented  Insight: Improving  Engagement in Group:  Engaged  Modes of Intervention:  Activity, Discussion, Education, and Support  Additional Comments:  Rates her energy at a 3. Did not share what holds her up.  Dione Housekeeper

## 2020-09-21 NOTE — Progress Notes (Signed)
Hypoglycemic Event  CBG: 61  Treatment: 8 oz juice/soda and 2 glucose tablets  Symptoms: Shaky  Follow-up CBG: Time 6:35  CBG Result 91  Possible Reasons for Event: Unknown  Comments/MD notified:Cody Ladona Ridgel PA notified of event at 6:50 am and verbal order given to hold glucotrol this am until doctor able to assess this morning.     Julie Morrow

## 2020-09-21 NOTE — Progress Notes (Signed)
[place] NOVEL CORONAVIRUS (COVID-19) DAILY CHECK-OFF SYMPTOMS - answer yes or no to each - every day NO YES  Have you had a fever in the past 24 hours?  . Fever (Temp > 37.80C / 100F) X   Have you had any of these symptoms in the past 24 hours? . New Cough .  Sore Throat  .  Shortness of Breath .  Difficulty Breathing .  Unexplained Body Aches   X   Have you had any one of these symptoms in the past 24 hours not related to allergies?   . Runny Nose .  Nasal Congestion .  Sneezing   X   If you have had runny nose, nasal congestion, sneezing in the past 24 hours, has it worsened?  X   EXPOSURES - check yes or no X   Have you traveled outside the state in the past 14 days?  X   Have you been in contact with someone with a confirmed diagnosis of COVID-19 or PUI in the past 14 days without wearing appropriate PPE?  X   Have you been living in the same home as a person with confirmed diagnosis of COVID-19 or a PUI (household contact)?    X   Have you been diagnosed with COVID-19?    X              What to do next: Answered NO to all: Answered YES to anything:   Proceed with unit schedule Follow the [facility] Inpatient Flowsheet.   
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
## 2020-09-21 NOTE — Downtime Event Note (Signed)
Writer entered patients room and observed her lying in bed awake. She reports having had an okay day and reports still feeling depressed and suicidal but able to verbally contract for safety. Writer encouraged her to try and come to the dayroom and watch some tv instead of staying in her room.  Writer informed her of medications that will be given later on tonight. Dan Humphreys is by her bedside for fall safety.

## 2020-09-21 NOTE — Progress Notes (Signed)
OT Cancellation Note  Patient Details Name: Julie Morrow MRN: [id number] DOB: 01/25/1951   Cancelled Treatment:     OT order reviewed. OT will follow up on Monday as able.  Carisma Troupe L Kemond Amorin 09/21/2020, 7:37 AM

## 2020-09-21 NOTE — Progress Notes (Signed)
   09/21/20 1950  COVID-19 Daily Checkoff  Have you had a fever (temp > 37.80C/100F)  in the past 24 hours?  No  If you have had runny nose, nasal congestion, sneezing in the past 24 hours, has it worsened? No  COVID-19 EXPOSURE  Have you traveled outside the state in the past 14 days? No  Have you been in contact with someone with a confirmed diagnosis of COVID-19 or PUI in the past 14 days without wearing appropriate PPE? No  Have you been living in the same home as a person with confirmed diagnosis of COVID-19 or a PUI (household contact)? No  Have you been diagnosed with COVID-19? No

## 2020-09-22 DIAGNOSIS — F3160 Bipolar disorder, current episode mixed, unspecified: Secondary | ICD-10-CM | POA: Diagnosis not present

## 2020-09-22 LAB — GLUCOSE, CAPILLARY
Glucose-Capillary: 75 mg/dL (ref 70–99)
Glucose-Capillary: 137 mg/dL — ABNORMAL HIGH (ref 70–99)
Glucose-Capillary: 96 mg/dL (ref 70–99)

## 2020-09-22 MED ORDER — DIPHENOXYLATE-ATROPINE 2.5-0.025 MG PO TABS: 2.0000 | ORAL_TABLET | Freq: Four times a day (QID) | ORAL | Status: DC | PRN

## 2020-09-22 MED ORDER — GLIPIZIDE 2.5 MG HALF TABLET
2.5000 mg | ORAL_TABLET | Freq: Two times a day (BID) | ORAL | Status: DC
  Administered 2020-09-22 – 2020-09-24 (×5): 2.5 mg via ORAL
  Filled 2020-09-22 (×8): qty 1

## 2020-09-22 MED ORDER — BUPROPION HCL 100 MG PO TABS
100.0000 mg | ORAL_TABLET | Freq: Every day | ORAL | Status: DC
  Administered 2020-09-23: 100 mg via ORAL
  Filled 2020-09-22 (×3): qty 1

## 2020-09-22 MED ORDER — MELATONIN 3 MG PO TABS
3.0000 mg | ORAL_TABLET | Freq: Every day | ORAL | Status: DC
  Administered 2020-09-22: 3 mg via ORAL
  Filled 2020-09-22 (×3): qty 1

## 2020-09-22 MED ORDER — DIVALPROEX SODIUM 500 MG PO DR TAB
500.0000 mg | DELAYED_RELEASE_TABLET | Freq: Every evening | ORAL | Status: DC
  Administered 2020-09-22 – 2020-09-26 (×4): 500 mg via ORAL
  Filled 2020-09-22 (×7): qty 1

## 2020-09-22 MED ORDER — TRAZODONE HCL 50 MG PO TABS
50.0000 mg | ORAL_TABLET | Freq: Every evening | ORAL | Status: DC | PRN
  Administered 2020-09-22 – 2020-09-23 (×2): 50 mg via ORAL
  Filled 2020-09-22 (×2): qty 1

## 2020-09-22 NOTE — Tx Team (Signed)
Interdisciplinary Treatment and Diagnostic Plan Update  09/22/2020 Time of Session: 9:35am  Julie Morrow MRN: [id number]  Principal Diagnosis: <principal problem not specified>  Secondary Diagnoses: Active Problems:   Bipolar disorder, mixed (HCC)   Current Medications:  Current Facility-Administered Medications  Medication Dose Route Frequency Provider Last Rate Last Admin  . acetaminophen (TYLENOL) tablet 650 mg  650 mg Oral Q6H PRN Clapacs, Jackquline Denmark, MD   650 mg at 09/21/20 1102  . alum & mag hydroxide-simeth (MAALOX/MYLANTA) 200-200-20 MG/5ML suspension 30 mL  30 mL Oral Q4H PRN Clapacs, John T, MD      . aspirin EC tablet 81 mg  81 mg Oral Daily Clapacs, Jackquline Denmark, MD   81 mg at 09/22/20 0855  . benztropine (COGENTIN) tablet 0.5 mg  0.5 mg Oral QHS Clapacs, John T, MD   0.5 mg at 09/21/20 2100  . [START ON 09/23/2020] buPROPion (WELLBUTRIN) tablet 100 mg  100 mg Oral Daily Antonieta Pert, MD      . fenofibrate tablet 160 mg  160 mg Oral Daily Clapacs, Jackquline Denmark, MD   160 mg at 09/22/20 9924  . glipiZIDE (GLUCOTROL) tablet 2.5 mg  2.5 mg Oral BID AC Antonieta Pert, MD      . levothyroxine (SYNTHROID) tablet 200 mcg  200 mcg Oral Q0600 Clapacs, Jackquline Denmark, MD   200 mcg at 09/22/20 0655  . loratadine (CLARITIN) tablet 10 mg  10 mg Oral Daily Clapacs, Jackquline Denmark, MD   10 mg at 09/22/20 0855  . magnesium hydroxide (MILK OF MAGNESIA) suspension 30 mL  30 mL Oral Daily PRN Clapacs, John T, MD      . metFORMIN (GLUCOPHAGE) tablet 500 mg  500 mg Oral Q breakfast Clapacs, Jackquline Denmark, MD   500 mg at 09/22/20 0854  . nicotine (NICODERM CQ - dosed in mg/24 hours) patch 14 mg  14 mg Transdermal Daily Antonieta Pert, MD   14 mg at 09/22/20 0856  . risperiDONE (RISPERDAL) tablet 6 mg  6 mg Oral QHS Clapacs, John T, MD   6 mg at 09/21/20 2100   PTA Medications: Medications Prior to Admission  Medication Sig Dispense Refill Last Dose  . aspirin 81 MG chewable tablet Chew 1 tablet (81 mg total) by mouth  daily. (Patient not taking: No sig reported) 30 tablet 1   . CINNAMON PO Take 1 tablet by mouth daily.     . divalproex (DEPAKOTE ER) 250 MG 24 hr tablet Take 750 mg by mouth at bedtime.     . fenofibrate 160 MG tablet Take 1 tablet (160 mg total) by mouth daily with breakfast. (Patient not taking: No sig reported) 30 tablet 1   . FEROSUL 325 (65 Fe) MG tablet Take 325 mg by mouth daily.     . hydrOXYzine (ATARAX/VISTARIL) 10 MG tablet Take 10 mg by mouth 3 (three) times daily as needed. Take 10 mg by mouth three times daily as needed for anxiety and 30 mg at bedtime as needed for sleep     . levothyroxine (SYNTHROID) 175 MCG tablet Take 175 mcg by mouth daily.     Marland Kitchen levothyroxine (SYNTHROID) 200 MCG tablet Take 1 tablet (200 mcg total) by mouth daily before breakfast. (Patient not taking: No sig reported) 30 tablet 1   . loratadine (CLARITIN) 10 MG tablet Take 1 tablet (10 mg total) by mouth daily. (Patient not taking: No sig reported) 30 tablet 1   . losartan (COZAAR) 25 MG tablet  Take 25 mg by mouth daily.     . melatonin 5 MG TABS Take 5-10 mg by mouth at bedtime.     . metFORMIN (GLUCOPHAGE) 500 MG tablet Take 1 tablet (500 mg total) by mouth daily with breakfast. (Patient not taking: No sig reported) 30 tablet 1   . Multiple Vitamins-Minerals (CENTRUM) tablet Take 1 tablet by mouth daily.     . polyvinyl alcohol (LIQUIFILM TEARS) 1.4 % ophthalmic solution Place 1 drop into both eyes as needed for dry eyes.     . Probiotic Product (PROBIOTIC PO) Take 1 capsule by mouth daily.     . risperiDONE (RISPERDAL) 3 MG tablet Take 2 tablets (6 mg total) by mouth at bedtime. (Patient not taking: No sig reported) 60 tablet 1   . rosuvastatin (CRESTOR) 40 MG tablet Take 40 mg by mouth daily.     . RYBELSUS 14 MG TABS Take 1 tablet by mouth every morning.     . traZODone (DESYREL) 50 MG tablet Take 50 mg by mouth at bedtime as needed for sleep.       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   Physician Treatment Plan for Primary Diagnosis: <principal problem not specified> Long Term Goal(s): Improvement in symptoms so as ready for discharge Improvement in symptoms so as ready for discharge   Short Term Goals: Ability to identify changes in lifestyle to reduce recurrence of condition will improve Ability to verbalize feelings will improve Ability to disclose and discuss suicidal ideas Ability to demonstrate self-control will improve Ability to identify and develop effective coping behaviors will improve Ability to maintain clinical measurements within normal limits will improve Compliance with prescribed medications will improve Ability to identify changes in lifestyle to reduce recurrence of condition will improve Ability to verbalize feelings will improve Ability to disclose and discuss suicidal ideas Ability to demonstrate self-control will improve Ability to identify and develop effective coping behaviors will improve Ability to maintain clinical measurements within normal limits will improve Compliance with prescribed medications will improve  Medication Management: Evaluate patient's response, side effects, and tolerance of medication regimen.  Therapeutic Interventions: 1 to 1 sessions, Unit Group sessions and Medication administration.  Evaluation of Outcomes: Progressing  Physician Treatment Plan for Secondary Diagnosis: Active Problems:   Bipolar disorder, mixed (HCC)  Long Term Goal(s): Improvement in symptoms so as ready for discharge Improvement in symptoms so as ready for discharge   Short Term Goals: Ability to  identify changes in lifestyle to reduce recurrence of condition will improve Ability to verbalize feelings will improve Ability to disclose and discuss suicidal ideas Ability to demonstrate self-control will improve Ability to identify and develop effective coping behaviors will improve Ability to maintain clinical measurements within normal limits will improve Compliance with prescribed medications will improve Ability to identify changes in lifestyle to reduce recurrence of condition will improve Ability to verbalize feelings will improve Ability to disclose and discuss suicidal ideas Ability to demonstrate self-control will improve Ability to identify and develop effective coping behaviors will improve Ability to maintain clinical measurements within normal limits will improve Compliance with prescribed medications will improve     Medication Management: Evaluate patient's response, side effects, and tolerance of medication regimen.  Therapeutic Interventions: 1  to 1 sessions, Unit Group sessions and Medication administration.  Evaluation of Outcomes: Progressing   RN Treatment Plan for Primary Diagnosis: <principal problem not specified> Long Term Goal(s): Knowledge of disease and therapeutic regimen to maintain health will improve  Short Term Goals: Ability to remain free from injury will improve, Ability to participate in decision making will improve, Ability to verbalize feelings will improve, Ability to disclose and discuss suicidal ideas and Ability to identify and develop effective coping behaviors will improve  Medication Management: RN will administer medications as ordered by provider, will assess and evaluate patient's response and provide education to patient for prescribed medication. RN will report any adverse and/or side effects to prescribing provider.  Therapeutic Interventions: 1 on 1 counseling sessions, Psychoeducation, Medication administration, Evaluate responses  to treatment, Monitor vital signs and CBGs as ordered, Perform/monitor CIWA, COWS, AIMS and Fall Risk screenings as ordered, Perform wound care treatments as ordered.  Evaluation of Outcomes: Progressing   LCSW Treatment Plan for Primary Diagnosis: <principal problem not specified> Long Term Goal(s): Safe transition to appropriate next level of care at discharge, Engage patient in therapeutic group addressing interpersonal concerns.  Short Term Goals: Engage patient in aftercare planning with referrals and resources, Increase social support, Increase emotional regulation, Facilitate acceptance of mental health diagnosis and concerns, Identify triggers associated with mental health/substance abuse issues and Increase skills for wellness and recovery  Therapeutic Interventions: Assess for all discharge needs, 1 to 1 time with Social worker, Explore available resources and support systems, Assess for adequacy in community support network, Educate family and significant other(s) on suicide prevention, Complete Psychosocial Assessment, Interpersonal group therapy.  Evaluation of Outcomes: Progressing   Progress in Treatment: Attending groups: Yes. Participating in groups: Yes. Taking medication as prescribed: Yes. Toleration medication: Yes. Family/Significant other contact made: Yes, individual(s) contacted:  Sister Patient understands diagnosis: No. Discussing patient identified problems/goals with staff: Yes. Medical problems stabilized or resolved: Yes. Denies suicidal/homicidal ideation: Yes. Issues/concerns per patient self-inventory: No.   New problem(s) identified: No, Describe:  None  New Short Term/Long Term Goal(s): medication stabilization, elimination of SI thoughts, development of comprehensive mental wellness plan.   Patient Goals: "To move forward and learn some new skills"  Discharge Plan or Barriers: Patient recently admitted. CSW will continue to follow and assess for  appropriate referrals and possible discharge planning.   Reason for Continuation of Hospitalization: Depression Medication stabilization Suicidal ideation  Estimated Length of Stay: 3 to 5 days   Attendees: Patient: Julie Morrow  09/22/2020   Physician: Landry Mellow, MD  09/22/2020   Nursing:  09/22/2020   RN Care Manager: 09/22/2020   Social Worker: Melba Coon, LCSWA  09/22/2020   Recreational Therapist:  09/22/2020   Other:  09/22/2020   Other:  09/22/2020   Other: 09/22/2020     Scribe for Treatment Team: Aram Beecham, LCSWA 09/22/2020 1:55 PM

## 2020-09-22 NOTE — Progress Notes (Addendum)
[facility]  09/23/2020 12:23 PM Julie DamesSusan B Morrow  MRN:  [id number]   Chief Complaint: depression and SI  Subjective:  Julie DamesSusan B Morrow is a 70 y.o. female with a past psychiatric history significant for bipolar d/o and anxiety, who was initially admitted for inpatient psychiatric hospitalization on 09/19/2020 for management of worsening depression and SI. The patient is currently on Hospital Day 4.   Chart Review from last 24 hours:  The patient's chart was reviewed and nursing notes were reviewed. The patient's case was discussed in multidisciplinary team meeting. Per nursing she ambulated with rolling walker and attended some groups. She had an episode of incontinence with diarrhea. Per MAR she was compliant with scheduled medications and did receive Trazodone X1 for insomnia.   Information Obtained Today During Patient Interview: The patient was seen and evaluated on the unit. On assessment today the patient reports that she still feels depressed and is frustrated because she does not believe she is sleeping. She was advised that per nursing she slept over 6 hours last night. She denies AVH, paranoia, or ideas of reference. When questioned about first rank symptoms, she does endorse belief in thought insertion but denies thought withdrawal or broadcasting. When asked who she thinks could be inserting thoughts she states "I'm not sure, maybe the devil." She denies SI or HI. She denies any CP, SOB, syncope, or current dizziness. We discussed her EKG and labs and she is unsure who her PCP is that manages her medical health. She cannot tell me her previous bipolar medication regimen or who has been managing her bipolar medications as an outpatient. She is vague as to whether she has been compliant with her home medications for BP, DM, cholesterol, or hypothyroidism.  Principal Problem: Bipolar depression (HCC) Diagnosis: Principal Problem:   Bipolar depression (HCC) Active Problems:   HLD  (hyperlipidemia)   Hypothyroidism   Diabetes (HCC)  Total Time Spent in Direct Patient Care:  I personally spent 30 minutes on the unit in direct patient care. The direct patient care time included face-to-face time with the patient, reviewing the patient's chart, communicating with other professionals, and coordinating care. Greater than 50% of this time was spent in counseling or coordinating care with the patient regarding goals of hospitalization, psycho-education, and discharge planning needs.  Past Psychiatric History: see admission H&P  Past Medical History:  Past Medical History:  Diagnosis Date  . Anxiety   . Bipolar 1 disorder (HCC)   . Bradycardia   . Depression   . Diabetes mellitus, type II (HCC)    Patient takes Glucotrol and Januvia  . Hypothyroidism   . Lithium toxicity     Past Surgical History:  Procedure Laterality Date  . ABDOMINAL HYSTERECTOMY    . CHOLECYSTECTOMY     Family History:  Family History  Problem Relation Age of Onset  . Dementia Mother   . Arthritis Father    Family Psychiatric  History: see admission H&P  Social History:  Social History   Substance and Sexual Activity  Alcohol Use No  . Alcohol/week: 0.0 standard drinks     Social History   Substance and Sexual Activity  Drug Use No    Social History   Socioeconomic History  . Marital status: Single    Spouse name: Not on file  . Number of children: Not on file  . Years of education: Not on file  . Highest education level: Not on file  Occupational History  . Not  on file  Tobacco Use  . Smoking status: Current Every Day Smoker    Packs/day: 0.50    Years: 20.00    Pack years: 10.00    Types: Cigarettes    Start date: 11/06/1984  . Smokeless tobacco: Never Used  Vaping Use  . Vaping Use: Never used  Substance and Sexual Activity  . Alcohol use: No    Alcohol/week: 0.0 standard drinks  . Drug use: No  . Sexual activity: Not Currently  Other Topics Concern  . Not  on file  Social History Narrative  . Not on file   Social Determinants of Health   Financial Resource Strain: Not on file  Food Insecurity: Not on file  Transportation Needs: Not on file  Physical Activity: Not on file  Stress: Not on file  Social Connections: Not on file   Sleep: Poor  Per patient but slept 6.25 hours per staff  Appetite:  Good  Current Medications: Current Facility-Administered Medications  Medication Dose Route Frequency Provider Last Rate Last Admin  . acetaminophen (TYLENOL) tablet 650 mg  650 mg Oral Q6H PRN Clapacs, Jackquline Denmark, MD   650 mg at 09/21/20 1102  . alum & mag hydroxide-simeth (MAALOX/MYLANTA) 200-200-20 MG/5ML suspension 30 mL  30 mL Oral Q4H PRN Clapacs, John T, MD      . aspirin EC tablet 81 mg  81 mg Oral Daily Clapacs, Jackquline Denmark, MD   81 mg at 09/23/20 0927  . benztropine (COGENTIN) tablet 0.5 mg  0.5 mg Oral BID Mason Jim, Inaaya Vellucci E, MD      . buPROPion [facility]) tablet 100 mg  100 mg Oral Daily Antonieta Pert, MD   100 mg at 09/23/20 4944  . diphenoxylate-atropine (LOMOTIL) 2.5-0.025 MG per tablet 2 tablet  2 tablet Oral QID PRN Antonieta Pert, MD      . divalproex (DEPAKOTE) DR tablet 500 mg  500 mg Oral QPM Antonieta Pert, MD   500 mg at 09/22/20 1728  . fenofibrate tablet 160 mg  160 mg Oral Daily Clapacs, Jackquline Denmark, MD   160 mg at 09/23/20 0927  . glipiZIDE (GLUCOTROL) tablet 2.5 mg  2.5 mg Oral BID AC Antonieta Pert, MD   2.5 mg at 09/23/20 9675  . [START ON 09/24/2020] levothyroxine (SYNTHROID) tablet 175 mcg  175 mcg Oral Q0600 Comer Locket, MD      . loratadine (CLARITIN) tablet 10 mg  10 mg Oral Daily Clapacs, Jackquline Denmark, MD   10 mg at 09/23/20 9163  . magnesium hydroxide (MILK OF MAGNESIA) suspension 30 mL  30 mL Oral Daily PRN Clapacs, John T, MD      . melatonin tablet 3 mg  3 mg Oral QHS Antonieta Pert, MD   3 mg at 09/22/20 2133  . metFORMIN (GLUCOPHAGE) tablet 500 mg  500 mg Oral Q breakfast Clapacs, John T, MD   500 mg  at 09/23/20 8466  . nicotine (NICODERM CQ - dosed in mg/24 hours) patch 14 mg  14 mg Transdermal Daily Antonieta Pert, MD   14 mg at 09/23/20 0930  . risperiDONE (RISPERDAL) tablet 6 mg  6 mg Oral QHS Clapacs, Jackquline Denmark, MD   6 mg at 09/22/20 2133  . rosuvastatin (CRESTOR) tablet 40 mg  40 mg Oral Daily Lowana Hable E, MD      . traZODone (DESYREL) tablet 50 mg  50 mg Oral QHS PRN Antonieta Pert, MD   50 mg at 09/22/20  2310    Lab Results:  Results for orders placed or performed during the hospital encounter of 09/19/20 (from the past 48 hour(s))  Glucose, capillary     Status: Abnormal   Collection Time: 09/21/20  5:31 PM  Result Value Ref Range   Glucose-Capillary 140 (H) 70 - 99 mg/dL    Comment: Glucose reference range applies only to samples taken after fasting for at least 8 hours.  Glucose, capillary     Status: Abnormal   Collection Time: 09/21/20  8:48 PM  Result Value Ref Range   Glucose-Capillary 101 (H) 70 - 99 mg/dL    Comment: Glucose reference range applies only to samples taken after fasting for at least 8 hours.  Glucose, capillary     Status: None   Collection Time: 09/22/20  6:10 AM  Result Value Ref Range   Glucose-Capillary 75 70 - 99 mg/dL    Comment: Glucose reference range applies only to samples taken after fasting for at least 8 hours.   Comment 1 Notify RN   Glucose, capillary     Status: Abnormal   Collection Time: 09/22/20 12:00 PM  Result Value Ref Range   Glucose-Capillary 137 (H) 70 - 99 mg/dL    Comment: Glucose reference range applies only to samples taken after fasting for at least 8 hours.  Glucose, capillary     Status: None   Collection Time: 09/22/20  5:01 PM  Result Value Ref Range   Glucose-Capillary 96 70 - 99 mg/dL    Comment: Glucose reference range applies only to samples taken after fasting for at least 8 hours.  Glucose, capillary     Status: None   Collection Time: 09/23/20  5:52 AM  Result Value Ref Range    Glucose-Capillary 79 70 - 99 mg/dL    Comment: Glucose reference range applies only to samples taken after fasting for at least 8 hours.   Comment 1 Notify RN   Glucose, capillary     Status: None   Collection Time: 09/23/20 11:39 AM  Result Value Ref Range   Glucose-Capillary 87 70 - 99 mg/dL    Comment: Glucose reference range applies only to samples taken after fasting for at least 8 hours.    Blood Alcohol level:  Lab Results  Component Value Date   ETH <10 09/16/2020   ETH <10 12/03/2019    Metabolic Disorder Labs: Lab Results  Component Value Date   HGBA1C 6.5 (H) 09/16/2020   MPG 139.85 09/16/2020   MPG 128.37 12/28/2018   Lab Results  Component Value Date   PROLACTIN 15.1 07/03/2015   Lab Results  Component Value Date   CHOL 106 09/16/2020   TRIG 315 (H) 09/16/2020   HDL 28 (L) 09/16/2020   CHOLHDL 3.8 09/16/2020   VLDL 63 (H) 09/16/2020   LDLCALC 15 09/16/2020   LDLCALC 38 12/28/2018    Physical Findings: AIMS: Facial and Oral Movements Muscles of Facial Expression: None, normal Lips and Perioral Area: None, normal Jaw: None, normal Tongue: None, normal,Extremity Movements Upper (arms, wrists, hands, fingers): None, normal Lower (legs, knees, ankles, toes): None, normal, Trunk Movements Neck, shoulders, hips: None, normal, Overall Severity Severity of abnormal movements (highest score from questions above): None, normal Incapacitation due to abnormal movements: None, normal Patient's awareness of abnormal movements (rate only patient's report): No Awareness, Dental Status Current problems with teeth and/or dentures?: No Does patient usually wear dentures?: No      Musculoskeletal: Strength & Muscle Tone: within  normal limits Gait & Station: untested Patient leans: N/A  Psychiatric Specialty Exam: Physical Exam Vitals reviewed.  HENT:     Head: Normocephalic.  Pulmonary:     Effort: Pulmonary effort is normal.  Neurological:     Mental  Status: She is alert.     Review of Systems  Respiratory: Negative for shortness of breath.   Cardiovascular: Negative for chest pain.  Gastrointestinal: Negative for nausea and vomiting.  Neurological: Negative for dizziness and syncope.    Blood pressure (!) 141/75, pulse 91, temperature 97.8 F (36.6 C), temperature source Oral, resp. rate 18, height 5\' 1"  (1.549 m), weight 105.7 kg, SpO2 99 %.Body mass index is 44.02 kg/m.  General Appearance: obese, casually dressed with fair hygiene  Eye Contact:  Fair  Speech:  Clear and Coherent and Normal Rate  Volume:  Normal  Mood:  Dysphoric  Affect:  Congruent  Thought Process:  Tangential with ruminations about sleep  Orientation:  Oriented to month, year, President, state  Thought Content:  Denies AVH, paranoia, or ideas of reference; denies thought withdrawal or broadcasting but endorses thought insertion  Suicidal Thoughts:  No  Homicidal Thoughts:  No  Memory:  Recent;   Fair  Judgement:  Fair  Insight:  Lacking  Psychomotor Activity:  Mild cogwheeling and stiffness LUE and none in RUE; no tremors  Concentration:  Concentration: Fair  Recall:  Poor  Fund of Knowledge:  Fair  Language:  Fair  Akathisia:  Negative  AIMS (if indicated):   0  Assets:  Desire for Improvement Resilience  ADL's:  independent  Sleep:  Number of Hours: 6.25   Treatment Plan Summary: Diagnoses / Active Problems: Bipolar d/o MRE depressed Type II DM Hypothyroidism Hypertriglyceridemia Tobacco Use d/o  PLAN: 1. Safety and Monitoring:  -- Voluntary admission to inpatient psychiatric unit for safety, stabilization and treatment  -- Daily contact with patient to assess and evaluate symptoms and progress in treatment  -- Patient's case to be discussed in multi-disciplinary team meeting  -- Observation Level : q15 minute checks  -- Vital signs:  q12 hours  -- Precautions: suicide  2. Psychiatric Diagnoses and Treatment:   Bipolar d/o MRE  depressed -- Increase Cogentin to 0.5mg  bid for side effects of medication -- Continue Risperdal 6mg  po qhs for mood stability and psychosis -- Continue Depakote 500mg  every afternoon for mood stability (AST 38, ALT 32, WBC 11.7, H/H 15.9/46.3, platelets 192) -- Continue Trazodone 50mg  po qhs PRN for insomnia  -- Increase Melatonin to 6mg  po qhs insomnia -- Continue Wellbutrin 100mg  daily for depression and monitor for symptom improvement with recent dose increase  -- Metabolic profile and EKG monitoring obtained while on an atypical antipsychotic (BMI:44.02;  Lipid Panel: cholesterol 106, triglycerides 315, HDL 28, LDL 15; HbgA1c: 6.5; QTc:446ms)   -- Encouraged patient to participate in unit milieu and in scheduled group therapies   -- Short Term Goals: Ability to identify changes in lifestyle to reduce recurrence of condition will improve and Ability to identify and develop effective coping behaviors will improve  -- Long Term Goals: Improvement in symptoms so as ready for discharge   3. Medical Issues Being Addressed:   Hypertriglyceridemia  -- Continue Fenofibrate 160mg  daily  -- Verified home med with pharmacist and will restart home Crestor 40mg  daily    Type II DM  -- Continue Glipizide 2.5mg  po bid  -- Continue Glucophage 500mg  daily  -- Checking FSBS tid and consulted diabetes coordinator   Hypothyroidism  --  Verified with pharmacist and recent home Synthroid dose was daily - medication adjusted to reflect home dosing  -- TSH 1.116   Seasonal Allergies  -- Continue Claritin  daily   Tobacco Use d/o  -- Smoking cessation encouraged  -- Continue Nicoderm patch /24 hours   Age Indeterminate septal infarct on EKG  -- Patient will remain on ASA  daily  -- Patient currently asymptomatic - spoke with Dr. Eden Emms, on call for cardiology, who feels was likely lead placement and he recommends f/u with PCP as an outpatient - no further inpatient w/u needed at this  time  -- Holding restart of Cozaar since patient has not been compliant with med prior to admission and monitoring blood pressures (today 131/67 and 107/80)   High Risk Medication Use: -- The complexity of this patient's case involves drug therapy with Depakote which requires intensive monitoring for toxicity. This is in part due to a narrow therapeutic window as well as potential for toxicity with this medication.   4. Discharge Planning:   -- Social work and case management to assist with discharge planning and identification of hospital follow-up needs prior to discharge  -- Estimated LOS: 3-4 days  -- Discharge Concerns: Need to establish a safety plan; Medication compliance and effectiveness  -- Discharge Goals: Return home with outpatient referrals for mental health follow-up including medication management/psychotherapy  Comer Locket, MD, FAPA 09/23/2020, 12:23 PM

## 2020-09-22 NOTE — Progress Notes (Signed)
Pt observed in bed on initial approach. Denies SI, HI, AVH and pain when assessed. Observed with intermittent confusion, flat affect and slight irritability in her demands for sleep medications during assessment. Pt was preoccupied about her sleep habit on assessment "I need to sleep, I didn't sleep well last night and before I got here. Please take me home. Please give me something to sleep now honey". Per nursing report slept 5.75 hours last night. Reports her appetite is good "I ate all my breakfast, I just want to sleep now". Ambulatory in room and in milieu with her walker, tolerating activities well. Tolerates all meals and fluids well. Observed in scheduled groups with peers in the 300 hall dayroom, interacts well with others and is getting her needs met safely. Pt cooperative with CBG monitoring as ordered. Showered this afternoon after having a BM and not making it to the bathroom on time. PT on unit to assess pt at this time.  Emotional support and encouragement offered. All medications given with verbal education and effects monitored. Q 15 minutes safety checks maintained without self harm gestures or outburst.  Fall precaution remains effective without incident to note thus far.

## 2020-09-22 NOTE — Progress Notes (Signed)
Recreation Therapy Notes  Date:  5.9.22 Time: 0930 Location: 300 Hall Dayroom  Group Topic: Stress Management  Goal Area(s) Addresses:  Patient will identify positive stress management techniques. Patient will identify benefits of using stress management post d/c.  Behavioral Response: Engaged, Attentive  Intervention: Stress Management  Activity :  Meditation.  LRT played a meditation that focused on the importance of self esteem.  Patients were to listen and follow along as the meditation played to fully engage in meditation.    Education:  Stress Management, Discharge Planning.   Education Outcome: Acknowledges Education  Clinical Observations/Feedback: Pt attended group session.    Julie Morrow, LRT/CTRS         Julie Morrow A 09/22/2020 11:06 AM 

## 2020-09-22 NOTE — Evaluation (Signed)
Physical Therapy Evaluation Patient Details Name: Julie Morrow MRN: [id number] DOB: [birth year] Today's Date: 09/22/2020   History of Present Illness  pt 70 yo admit with reports of suicide ideation per sister. PT consulted to assess due to arrived to [facility] ED  09/16/2020 and then admit to [facility] on 09/20/2020 in wheelchair.  Clinical Impression  Pt in room after episode on incontinence with diarrhea. Pt states this is not normal for her she just has had this today. Walked to restroom with RW and stepped over items and got into shower independently and re clothed herself. Walking in room independently with RW and states she was walking in hallway as well with RW without difficulty. I also was able to see her walk without RW in room with no loss of balance as well. However then she needed to have another BM so session was stopped at this time. She stated she feels she is getting around fairly well and will use RW at her home if she feels she will need it.     Follow Up Recommendations No PT follow up    Equipment Recommendations   (pt states she has a RW at home if she needs it.)    Recommendations for Other Services       Precautions / Restrictions Precautions Precautions: None      Mobility  Bed Mobility Overal bed mobility: Independent                  Transfers Overall transfer level: Independent (slow to rise and uses her UEs on her LEs to rise and get upright at times)                  Ambulation/Gait Ambulation/Gait assistance: Modified independent (Device/Increase time) Gait Distance (Feet): 25 Feet Assistive device: Rolling walker (2 wheeled)   Gait velocity: slow, wide base of support due to body habitus   General Gait Details: pt using RW in room independently and also witnessed using no AD in room with no issues for this short disant. Steady on feet in bathroom turning ans stepping over clothes, did use handrail for safety appropirately as well  Stairs             Wheelchair Mobility    Modified Rankin (Stroke Patients Only)       Balance Overall balance assessment: Modified Independent (no balance deficits seen., did use rails and RW at times and held to furniture)                                           Pertinent Vitals/Pain Pain Assessment: Faces Pain Score: 2  Pain Location: R leg , vague with description , all over but at times focuses on the anterior leg and anterior knee area. Pain gets better when lying down and sitting not bad, but hurts with squating and walking at times, Pain Descriptors / Indicators: Sore Pain Intervention(s): Limited activity within patient's tolerance;Monitored during session    Home Living Family/patient expects to be discharged to:: Private residence   Available Help at Discharge: Family (pt reports they stop by at least 1x per week to bring grocery and medicines and to check on patient)   Home Access: Level entry     Home Layout: One level Home Equipment: Walker - 2 wheels      Prior Function Level of Independence: Independent  Comments: pt reports that she doesn't use a RW in the home but has one in case she needs it. Reports she fixes her meals and completes all ADLS herself. She is not drving at this time     Hand Dominance        Extremity/Trunk Assessment        Lower Extremity Assessment Lower Extremity Assessment: Overall WFL for tasks assessed (difficult to formally assess due to pt just had incontience of diarrhea  and needed to return to bathroom to take a shower and change clothes.)       Communication   Communication: No difficulties  Cognition Arousal/Alertness: Awake/alert Behavior During Therapy: WFL for tasks assessed/performed Overall Cognitive Status: Within Functional Limits for tasks assessed                                        General Comments      Exercises     Assessment/Plan    PT Assessment  Patent does not need any further PT services  PT Problem List         PT Treatment Interventions      PT Goals (Current goals can be found in the Care Plan section)  Acute Rehab PT Goals Patient Stated Goal: I am feeling better and want to be able to go back home PT Goal Formulation: All assessment and education complete, DC therapy    Frequency     Barriers to discharge        Co-evaluation               AM-PAC PT "6 Clicks" Mobility  Outcome Measure Help needed turning from your back to your side while in a flat bed without using bedrails?: None Help needed moving from lying on your back to sitting on the side of a flat bed without using bedrails?: None Help needed moving to and from a bed to a chair (including a wheelchair)?: None Help needed standing up from a chair using your arms (e.g., wheelchair or bedside chair)?: None Help needed to walk in hospital room?: A Little Help needed climbing 3-5 steps with a railing? : A Little 6 Click Score: 22    End of Session   Activity Tolerance: Other (comment);Patient tolerated treatment well (limites session due to diarrhea at this time and then after shower, she need to go tot he restroom again .) Patient left:  (in bathroom) Nurse Communication: Mobility status      Time: 2563-8937 PT Time Calculation (min) (ACUTE ONLY): 18 min   Charges:   PT Evaluation $PT Eval Low Complexity: 1 Low          Aydrien Froman, PT, MPT Acute Rehabilitation Services Office: [phone] Pager: [phone] 09/22/2020   Marella Bile 09/22/2020, 3:10 PM

## 2020-09-22 NOTE — [facility] Group Notes (Signed)
Type of Therapy and Topic: Group Therapy: Anger Management   Participation: Attended   Description of Group: In this group, patients will learn helpful strategies and techniques to manage anger, express anger in alternative ways, change hostile attitudes, and prevent aggressive acts, such as verbal abuse and violence.This group will be process-oriented and eductional, with patients participating in exploration of their own experiences as well as giving and receiving support and challenge from other group members.  Therapeutic Goals: 1. Patient will learn to manage anger. 2. Patient will learn to stop violence or the threat of violence. 3. Patient will learn to develop self control over thoughts and actions. 4. Patient will receive support and feedback from others  Therapeutic Modalities: Cognitive Behavioral Therapy Solution Focused Therapy Motivational Interviewing 

## 2020-09-22 NOTE — [facility] Suicide Risk Assessment (Signed)
[facility] INPATIENT:  Family/Significant Other Suicide Prevention Education  Suicide Prevention Education:  Education Completed; Tilman Neat [phone] (Sister) has been identified by the patient as the family member/significant other with whom the patient will be residing, and identified as the person(s) who will aid the patient in the event of a mental health crisis (suicidal ideations/suicide attempt).  With written consent from the patient, the family member/significant other has been provided the following suicide prevention education, prior to the and/or following the discharge of the patient.  The suicide prevention education provided includes the following:  Suicide risk factors  Suicide prevention and interventions  National Suicide Hotline telephone number  [facility] assessment telephone number  [facility] Emergency Assistance 911  [facility] and/or Residential Mobile Crisis Unit telephone number  Request made of family/significant other to:  Remove weapons (e.g., guns, rifles, knives), all items previously/currently identified as safety concern.    Remove drugs/medications (over-the-counter, prescriptions, illicit drugs), all items previously/currently identified as a safety concern.  The family member/significant other verbalizes understanding of the suicide prevention education information provided.  The family member/significant other agrees to remove the items of safety concern listed above.  CSW spoke with Mrs. Chrismon who states that her sister was living with their father and then she moved into her own home.  Mrs. Chrismon states that for the past month her sister has been depressed, not eating, and not sleeping.  Mrs. Chrismon states that her sister cannot live with her father because her father is 32 years old and is not in good health.  Mrs. Chrismon states "once our father dies we are selling the home and she cannot be there when we sell it".   Mrs. Chrismon states that she also is not capable of taking care of both her father and her sister.  Mrs. Chrismon state that her sister's daughter passed away approximately one year ago.  Mrs. Chrismon states that she is not sure why her sister left the nursing home and is not able to provide CSW with any further information about her sister.  Mrs. Chrismon states that there are no firearms or weapons in the home that she is aware of.  CSW completed SPE with Mrs. Chrismon.   Metro Kung Jordie Skalsky 09/22/2020, 3:11 PM

## 2020-09-22 NOTE — Progress Notes (Signed)
Spiritual care group on grief and loss facilitated by chaplain Burnis Kingfisher MDiv, [facility]  Group Goal:  Support / Education around grief and loss Members engage in facilitated group support and psycho-social education.  Group Description:  Following introductions and group rules, group members engaged in facilitated group dialog and support around topic of loss, with particular support around experiences of loss in their lives. Group Identified types of loss (relationships / self / things) and identified patterns, circumstances, and changes that precipitate losses. Reflected on thoughts / feelings around loss, normalized grief responses, and recognized variety in grief experience.   Group noted Worden's four tasks of grief in discussion.  Group drew on Adlerian / Rogerian, narrative, MI, Patient Progress:  Julie Morrow was present throughout group.  Attentive to group members as evidenced by eye contact.  Did not engage verbally in group discussion.

## 2020-09-22 NOTE — Progress Notes (Signed)
[facility] MD Progress Note  09/22/2020 1:47 PM Julie Morrow  MRN:  [id number] Subjective:  Patient is a 70 year old female with a reported past psychiatric history significant for bipolar disorder, anxiety disorder, depression who originally presented to the [facility] emergency department on 09/13/2020 after having been dropped off by her sister.  The patient's original complaint was "I cannot take it anymore".  Objective: Patient is seen and examined.  Patient is a 70 year old female with the above-stated past psychiatric history who is seen in follow-up.  She stated today several things.  #1 she stated that her depression was a 2 out of 10.  The second thing she stated was that she did not sleep well last night.  The third thing she stated is when I am I getting out of here.  She provided some additional history.  She stated that recently she had been living in a home by herself that she had purchased.  Prior to that she was living with her father.  She stated she had attempted to be admitted to [facility] regional twice, and then was transferred here on her second attempt.  She stated that she had stopped taking her medications but that was not her fault.  She said she had no medications to take.  She stated that she needed more medication today. Initially her blood pressure was stable at 130/72, but repeat was 141/75.  Pulse went from 69-91.  She is afebrile.  Pulse oximetry on room air was 99%.  Nursing notes reflected she slept 5.75 hours.  Her blood sugar this a.m. again got low at 75.  Repeat at lunch was 137.  I have already reduced her glipizide down to 2.5 mg p.o. twice daily.  No other new laboratories.  Principal Problem: <principal problem not specified> Diagnosis: Active Problems:   Bipolar disorder, mixed (HCC)  Total Time spent with patient: 20 minutes  Past Psychiatric History: See admission H&P  Past Medical History:  Past Medical History:  Diagnosis Date  .  Anxiety   . Bipolar 1 disorder (HCC)   . Bradycardia   . Depression   . Diabetes mellitus, type II (HCC)    Patient takes Glucotrol and Januvia  . Hypothyroidism   . Lithium toxicity     Past Surgical History:  Procedure Laterality Date  . ABDOMINAL HYSTERECTOMY    . CHOLECYSTECTOMY     Family History:  Family History  Problem Relation Age of Onset  . Dementia Mother   . Arthritis Father    Family Psychiatric  History: See admission H&P Social History:  Social History   Substance and Sexual Activity  Alcohol Use No  . Alcohol/week: 0.0 standard drinks     Social History   Substance and Sexual Activity  Drug Use No    Social History   Socioeconomic History  . Marital status: Single    Spouse name: Not on file  . Number of children: Not on file  . Years of education: Not on file  . Highest education level: Not on file  Occupational History  . Not on file  Tobacco Use  . Smoking status: Current Every Day Smoker    Packs/day: 0.50    Years: 20.00    Pack years: 10.00    Types: Cigarettes    Start date: 11/06/1984  . Smokeless tobacco: Never Used  Vaping Use  . Vaping Use: Never used  Substance and Sexual Activity  . Alcohol use: No    Alcohol/week:  0.0 standard drinks  . Drug use: No  . Sexual activity: Not Currently  Other Topics Concern  . Not on file  Social History Narrative  . Not on file   Social Determinants of Health   Financial Resource Strain: Not on file  Food Insecurity: Not on file  Transportation Needs: Not on file  Physical Activity: Not on file  Stress: Not on file  Social Connections: Not on file   Additional Social History:                         Sleep: Fair  Appetite:  Fair  Current Medications: Current Facility-Administered Medications  Medication Dose Route Frequency Provider Last Rate Last Admin  . acetaminophen (TYLENOL) tablet 650 mg  650 mg Oral Q6H PRN Clapacs, Jackquline Denmark, MD   650 mg at 09/21/20 1102  .  alum & mag hydroxide-simeth (MAALOX/MYLANTA) 200-200-20 MG/5ML suspension 30 mL  30 mL Oral Q4H PRN Clapacs, John T, MD      . aspirin EC tablet 81 mg  81 mg Oral Daily Clapacs, Jackquline Denmark, MD   81 mg at 09/22/20 0855  . benztropine (COGENTIN) tablet 0.5 mg  0.5 mg Oral QHS Clapacs, John T, MD   0.5 mg at 09/21/20 2100  . buPROPion [facility]) tablet 75 mg  75 mg Oral Daily Antonieta Pert, MD   75 mg at 09/22/20 0854  . fenofibrate tablet 160 mg  160 mg Oral Daily Clapacs, Jackquline Denmark, MD   160 mg at 09/22/20 5537  . glipiZIDE (GLUCOTROL) tablet 2.5 mg  2.5 mg Oral BID AC Antonieta Pert, MD      . levothyroxine (SYNTHROID) tablet 200 mcg  200 mcg Oral Q0600 Clapacs, Jackquline Denmark, MD   200 mcg at 09/22/20 0655  . loratadine (CLARITIN) tablet 10 mg  10 mg Oral Daily Clapacs, Jackquline Denmark, MD   10 mg at 09/22/20 0855  . magnesium hydroxide (MILK OF MAGNESIA) suspension 30 mL  30 mL Oral Daily PRN Clapacs, John T, MD      . metFORMIN (GLUCOPHAGE) tablet 500 mg  500 mg Oral Q breakfast Clapacs, Jackquline Denmark, MD   500 mg at 09/22/20 0854  . nicotine (NICODERM CQ - dosed in mg/24 hours) patch 14 mg  14 mg Transdermal Daily Antonieta Pert, MD   14 mg at 09/22/20 0856  . risperiDONE (RISPERDAL) tablet 6 mg  6 mg Oral QHS Clapacs, John T, MD   6 mg at 09/21/20 2100    Lab Results:  Results for orders placed or performed during the hospital encounter of 09/19/20 (from the past 48 hour(s))  Glucose, capillary     Status: Abnormal   Collection Time: 09/21/20  6:10 AM  Result Value Ref Range   Glucose-Capillary 61 (L) 70 - 99 mg/dL    Comment: Glucose reference range applies only to samples taken after fasting for at least 8 hours.  Glucose, capillary     Status: None   Collection Time: 09/21/20  6:48 AM  Result Value Ref Range   Glucose-Capillary 90 70 - 99 mg/dL    Comment: Glucose reference range applies only to samples taken after fasting for at least 8 hours.  Glucose, capillary     Status: Abnormal   Collection  Time: 09/21/20  5:31 PM  Result Value Ref Range   Glucose-Capillary 140 (H) 70 - 99 mg/dL    Comment: Glucose reference range applies only to samples  taken after fasting for at least 8 hours.  Glucose, capillary     Status: Abnormal   Collection Time: 09/21/20  8:48 PM  Result Value Ref Range   Glucose-Capillary 101 (H) 70 - 99 mg/dL    Comment: Glucose reference range applies only to samples taken after fasting for at least 8 hours.  Glucose, capillary     Status: None   Collection Time: 09/22/20  6:10 AM  Result Value Ref Range   Glucose-Capillary 75 70 - 99 mg/dL    Comment: Glucose reference range applies only to samples taken after fasting for at least 8 hours.   Comment 1 Notify RN   Glucose, capillary     Status: Abnormal   Collection Time: 09/22/20 12:00 PM  Result Value Ref Range   Glucose-Capillary 137 (H) 70 - 99 mg/dL    Comment: Glucose reference range applies only to samples taken after fasting for at least 8 hours.    Blood Alcohol level:  Lab Results  Component Value Date   ETH <10 09/16/2020   ETH <10 12/03/2019    Metabolic Disorder Labs: Lab Results  Component Value Date   HGBA1C 6.5 (H) 09/16/2020   MPG 139.85 09/16/2020   MPG 128.37 12/28/2018   Lab Results  Component Value Date   PROLACTIN 15.1 07/03/2015   Lab Results  Component Value Date   CHOL 106 09/16/2020   TRIG 315 (H) 09/16/2020   HDL 28 (L) 09/16/2020   CHOLHDL 3.8 09/16/2020   VLDL 63 (H) 09/16/2020   LDLCALC 15 09/16/2020   LDLCALC 38 12/28/2018    Physical Findings: AIMS: Facial and Oral Movements Muscles of Facial Expression: None, normal Lips and Perioral Area: None, normal Jaw: None, normal Tongue: None, normal,Extremity Movements Upper (arms, wrists, hands, fingers): None, normal Lower (legs, knees, ankles, toes): None, normal, Trunk Movements Neck, shoulders, hips: None, normal, Overall Severity Severity of abnormal movements (highest score from questions above):  None, normal Incapacitation due to abnormal movements: None, normal Patient's awareness of abnormal movements (rate only patient's report): No Awareness, Dental Status Current problems with teeth and/or dentures?: No Does patient usually wear dentures?: No  CIWA:    COWS:     Musculoskeletal: Strength & Muscle Tone: decreased Gait & Station: unsteady Patient leans: N/A  Psychiatric Specialty Exam:  Presentation  General Appearance: Fairly Groomed  Eye Contact:Fair  Speech:Normal Rate  Speech Volume:Normal  Handedness:Right   Mood and Affect  Mood:Anxious  Affect:Congruent   Thought Process  Thought Processes:Goal Directed  Descriptions of Associations:Circumstantial  Orientation:Full (Time, Place and Person)  Thought Content:Rumination  History of Schizophrenia/Schizoaffective disorder:No  Duration of Psychotic Symptoms:No data recorded Hallucinations:Hallucinations: None  Ideas of Reference:None  Suicidal Thoughts:Suicidal Thoughts: No SI Active Intent and/or Plan: Without Intent  Homicidal Thoughts:Homicidal Thoughts: No   Sensorium  Memory:Immediate Fair; Recent Fair; Remote Fair  Judgment:Poor  Insight:Fair   Executive Functions  Concentration:Fair  Attention Span:Fair  Recall:Poor  Fund of Knowledge:Poor  Language:Fair   Psychomotor Activity  Psychomotor Activity:Psychomotor Activity: Decreased   Assets  Assets:Desire for Improvement; Resilience   Sleep  Sleep:Sleep: Fair Number of Hours of Sleep: 5.5    Physical Exam: Physical Exam Vitals and nursing note reviewed.  HENT:     Head: Normocephalic and atraumatic.  Pulmonary:     Effort: Pulmonary effort is normal.  Neurological:     Mental Status: She is alert.     Motor: Weakness present.     Gait: Gait abnormal.  ROS Blood pressure (!) 141/75, pulse 91, temperature 97.8 F (36.6 C), temperature source Oral, resp. rate 18, height 5\' 1"  (1.549 m), weight  105.7 kg, SpO2 99 %. Body mass index is 44.02 kg/m.   Treatment Plan Summary: Daily contact with patient to assess and evaluate symptoms and progress in treatment, Medication management and Plan : Patient is seen and examined.  Patient is a 70 year old female with the above-stated past psychiatric history who is seen in follow-up.   Diagnosis: 1.  Bipolar disorder, most recently depressed 2.  Type 2 diabetes mellitus 3.  Hypothyroidism 4.  Mild cognitive impairment  Pertinent findings on examination today: 1.  Patient remains depressed. 2.  Patient continued to endorse passive suicidal ideation. 3.  Blood sugar still remains low. 4.  Patient stated she is unable to sleep well. 5.  Patient continues to be able to use a walker versus a wheelchair. 6.  Vital signs are stable.  Plan: 1.  Continue Cogentin 0.5 mg p.o. nightly for side effects of medication. 2.  Increase short acting Wellbutrin to 100 mg p.o. daily.  This is for depression.  We will need to continue to monitor for exacerbation of bipolar disorder; mania. 3.  Continue fenofibrate 160 mg p.o. daily for hypertriglyceridemia. 4.  Decrease glipizide to 2.5 mg p.o. twice daily for diabetes mellitus. 5.  Continue levothyroxine 200 mcg p.o. daily for hypothyroidism. 6.  Continue Claritin 10 mg p.o. daily for seasonal allergies. 7.  Continue Glucophage 500 mg p.o. daily for diabetes mellitus type 2. 8.  Patient continues on Risperdal 6 mg p.o. nightly for mood stability and psychosis. 9.  Add back trazodone 50 mg p.o. nightly for insomnia. 10.  Add back melatonin 5 mg 1-2 tabs p.o. nightly as needed insomnia. 11.  Add back Depakote DR 500 mg p.o. every afternoon for mood stability. 12.  Disposition planning-in progress.  Antonieta PertGreg Lawson Demonta Wombles, MD 09/22/2020, 1:47 PM

## 2020-09-23 DIAGNOSIS — F319 Bipolar disorder, unspecified: Secondary | ICD-10-CM | POA: Diagnosis not present

## 2020-09-23 DIAGNOSIS — E119 Type 2 diabetes mellitus without complications: Secondary | ICD-10-CM

## 2020-09-23 LAB — GLUCOSE, CAPILLARY
Glucose-Capillary: 79 mg/dL (ref 70–99)
Glucose-Capillary: 87 mg/dL (ref 70–99)
Glucose-Capillary: 105 mg/dL — ABNORMAL HIGH (ref 70–99)

## 2020-09-23 MED ORDER — ROSUVASTATIN CALCIUM 40 MG PO TABS
40.0000 mg | ORAL_TABLET | Freq: Every day | ORAL | Status: DC
  Administered 2020-09-23 – 2020-10-07 (×15): 40 mg via ORAL
  Filled 2020-09-23 (×16): qty 1

## 2020-09-23 MED ORDER — BENZTROPINE MESYLATE 0.5 MG PO TABS
0.5000 mg | ORAL_TABLET | Freq: Two times a day (BID) | ORAL | Status: DC
  Administered 2020-09-23 – 2020-10-07 (×27): 0.5 mg via ORAL
  Filled 2020-09-23 (×31): qty 1

## 2020-09-23 MED ORDER — LEVOTHYROXINE SODIUM 175 MCG PO TABS
175.0000 ug | ORAL_TABLET | Freq: Every day | ORAL | Status: DC
  Administered 2020-09-24 – 2020-10-07 (×12): 175 ug via ORAL
  Filled 2020-09-23 (×15): qty 1

## 2020-09-23 MED ORDER — TRAZODONE HCL 50 MG PO TABS
50.0000 mg | ORAL_TABLET | Freq: Once | ORAL | Status: AC | PRN
  Administered 2020-09-23: 50 mg via ORAL
  Filled 2020-09-23: qty 1

## 2020-09-23 MED ORDER — MELATONIN 3 MG PO TABS
6.0000 mg | ORAL_TABLET | Freq: Every day | ORAL | Status: DC
  Filled 2020-09-23 (×2): qty 2

## 2020-09-23 NOTE — Progress Notes (Signed)
Pt rates her depression and anxiety at a 10 out of 10 with 10 being the highest.  Pt is forgetful and appears to be confused at times during conversation between nurse and pt.  Pt's blood sugars have been well controlled this shift.  Pt is a high fall risk and is using a 4 wheel walker.  Pt took medications without incident.  RN assessed for needs and concerns and provided support.  Pt remains safe on the unit with q 15 min checks in place.

## 2020-09-23 NOTE — [facility] Counselor (Signed)
CSW attempted to make an Adult Protective Services reports with [facility].  CSW contacted [phone] and [phone] and left voicemails asking for a return call to complete the report.  CSW will attempt to contact them again tomorrow on 09/24/2020.

## 2020-09-23 NOTE — Progress Notes (Signed)
Pt rates her anxiety and depression a 10 tonight on a scale of 0-10 (10 being the worse). When asked about stressors or triggers she said "idk." Pt was resting in bed at the beginning of the shift when this writer did her assessment. She also did not attend group. She was preoccupied with getting her sleep medication. She was informed that she has melatonin scheduled for tonight, but proceeded to say that she knows that isn't going to be enough. She said that 3 years ago she lost her 70 y.o. daughter to a heart attack. Pt said that her daughter also had myotonic dystrophy and was supposed to follow-up with a doctor, but she never did. She said that before admission she was staying with her 70 year old father. Pt said that he is doing fine now. She said that he fell 2 years ago and had a stroke. Her plan is to return back home to him. Pt was administered her PRN trazodone at 2310 and it was effective. Pt denies SI/HI and AVH. Active listening, reassurance, and support provided. Medications administered as ordered by provider. Q 15 min safety checks continue. Pt's safety has been maintained.    09/22/20 2000  Psych Admission Type (Psych Patients Only)  Admission Status Voluntary  Psychosocial Assessment  Patient Complaints Anxiety;Depression  Eye Contact Fair  Facial Expression Flat  Affect Appropriate to circumstance  Speech Logical/coherent;Slow  Interaction Assertive  Motor Activity Slow  Appearance/Hygiene Disheveled  Behavior Characteristics Cooperative;Appropriate to situation  Mood Depressed;Anxious;Pleasant  Thought Process  Coherency WDL  Content WDL  Delusions None reported or observed  Perception WDL  Hallucination None reported or observed  Judgment WDL  Confusion Mild  Danger to Self  Current suicidal ideation? Denies  Danger to Others  Danger to Others None reported or observed

## 2020-09-23 NOTE — Progress Notes (Addendum)
   09/23/20 2158  Psych Admission Type (Psych Patients Only)  Admission Status Voluntary  Psychosocial Assessment  Patient Complaints Anxiety;Insomnia  Eye Contact Fair  Facial Expression Flat  Affect Anxious  Speech Slow;Logical/coherent  Interaction Assertive  Motor Activity Slow  Appearance/Hygiene Disheveled  Behavior Characteristics Cooperative;Appropriate to situation;Anxious  Mood Anxious  Thought Process  Coherency Concrete thinking  Content WDL  Delusions None reported or observed  Perception WDL  Hallucination None reported or observed  Judgment WDL  Confusion Mild  Danger to Self  Current suicidal ideation? Denies  Danger to Others  Danger to Others None reported or observed   Pt seen at med window. Pt denies SI, HI, AVH. Pt rates pain 9/10 as chronic pain in her right leg. Pt rates anxiety 9/10. Pt states she has not been sleeping well. Says melatonin doesn't work for her. Asked provider for additional 50 mg of Trazodone. Pt blood sugar 105 this evening. Pt takes all medications as prescribed.

## 2020-09-23 NOTE — Progress Notes (Signed)
Pt did not attend morning group.

## 2020-09-24 DIAGNOSIS — F319 Bipolar disorder, unspecified: Secondary | ICD-10-CM | POA: Diagnosis not present

## 2020-09-24 LAB — GLUCOSE, CAPILLARY
Glucose-Capillary: 104 mg/dL — ABNORMAL HIGH (ref 70–99)
Glucose-Capillary: 92 mg/dL (ref 70–99)
Glucose-Capillary: 121 mg/dL — ABNORMAL HIGH (ref 70–99)

## 2020-09-24 MED ORDER — TRAZODONE HCL 100 MG PO TABS
100.0000 mg | ORAL_TABLET | Freq: Every day | ORAL | Status: DC
  Administered 2020-09-24: 100 mg via ORAL
  Filled 2020-09-24 (×3): qty 1

## 2020-09-24 MED ORDER — BUPROPION HCL ER (XL) 150 MG PO TB24
150.0000 mg | ORAL_TABLET | Freq: Every day | ORAL | Status: DC
  Administered 2020-09-24 – 2020-10-03 (×10): 150 mg via ORAL
  Filled 2020-09-24 (×12): qty 1

## 2020-09-24 MED ORDER — NYSTATIN 100000 UNIT/GM EX POWD
Freq: Three times a day (TID) | CUTANEOUS | Status: DC
  Administered 2020-09-25: 1 [IU] via TOPICAL
  Administered 2020-10-04 – 2020-10-05 (×5): 1 via TOPICAL
  Filled 2020-09-24 (×3): qty 15

## 2020-09-24 NOTE — Progress Notes (Signed)
Pt observed this evening presenting with some confusion and took time to comprehend when communicated to and required repeation. Pt was able to come out of her room,  went and sat in the dayroom for snacks. Going back to her  room, pt got confused, went to another pt room and sat on the bed. Staff had to show her where her room was. Pt given her evening medications and took without any problems, denied SI/Hi and contracted for safety, will continue to monitor.

## 2020-09-24 NOTE — [facility] Group Notes (Signed)
LCSW Group Therapy Note  Type of Therapy/Topic: Group Therapy: Six Dimensions of Wellness  Participation Level: Did Not Attend  Description of Group:  This group will address the concept of wellness and the six concepts of wellness: occupational, physical, social, intellectual, spiritual, and emotional. Patients will be encouraged to process areas in their lives that are out of balance and identify reasons for remaining unbalanced. Patients will be encouraged to explore ways to practice healthy habits daily to attain better physical and mental health outcomes.  Therapeutic Goals:  1. Identify aspects of wellness that they are doing well.  2. Identify aspects of wellness that they would like to improve upon.  3. Identify one action they can take to improve an aspect of wellness in their lives.  Summary of Patient Progress: Did not attend  

## 2020-09-24 NOTE — Progress Notes (Signed)
Recreation Therapy Notes  Date: 5.11.22 Time: 0930 Location: [address] Dayroom  Group Topic: Stress Management   Goal Area(s) Addresses:  Patient will actively participate in stress management techniques presented during session.  Patient will successfully identify benefit of practicing stress management post d/c.   Intervention: Guided exercise with ambient sound and script  Activity :Guided Imagery  LRT read a script that focused enjoying the sights and sounds of being in a wildlife sanctuary.  Patients were to listen, focus on their breathing and relax to follow along as script was being read.  Education:  Stress Management, Discharge Planning.   Education Outcome: Acknowledges education  Clinical Observations/Feedback: Patient did not attend group session.     Caroll Rancher, LRT/CTRS    Lillia Abed, [place] Jakub[place]ak A 09/24/2020 11:01 AM

## 2020-09-24 NOTE — [facility] Group Notes (Signed)
Pt did not attend group. 

## 2020-09-24 NOTE — Progress Notes (Signed)
Psychoeducational Group Note  Date:  09/24/2020 Time:  2325  Group Topic/Focus:  Wrap-Up Group:   The focus of this group is to help patients review their daily goal of treatment and discuss progress on daily workbooks.  Participation Level: Did Not Attend  Participation Quality:  Not Applicable  Affect:  Not Applicable  Cognitive:  Not Applicable  Insight:  Not Applicable  Engagement in Group: Not Applicable  Additional Comments:  The patient did not attend group this evening.   Hazle Coca S 09/24/2020, 11:25 PM

## 2020-09-24 NOTE — Progress Notes (Signed)
Inpatient Diabetes Program Recommendations  AACE/ADA: New Consensus Statement on Inpatient Glycemic Control (2015)  Target Ranges:  Prepandial:   less than 140 mg/dL      Peak postprandial:   less than 180 mg/dL (1-2 hours)      Critically ill patients:  140 - 180 mg/dL   Lab Results  Component Value Date   GLUCAP 104 (H) 09/24/2020   HGBA1C 6.5 (H) 09/16/2020    Review of Glycemic Control Results for Julie Morrow, Julie Morrow (MRN [id number]) as of 09/24/2020 10:23  Ref. Range 09/22/2020 17:01 09/23/2020 05:52 09/23/2020 11:39 09/23/2020 16:58 09/24/2020 06:36  Glucose-Capillary Latest Ref Range: 70 - 99 mg/dL 96 79 87 081 (H) 448 (H)   Diabetes history:  DM2 Outpatient Diabetes medications:  Glipizide 2.5 mg BID Current orders for Inpatient glycemic control:  Glipizide 2.5 mg BID Metformin 1000 mg BID  Reached out to Chrissie Noa, [place] yesterday about Patient home diabetes regimen.  She takes Glipizide, does not take Metformin and checks CBG's once a week.  Current inpatient regimen is working well.  No recommendations at this time.    Will continue to follow while inpatient.  Thank you, Dulce Sellar, RN, BSN Diabetes Coordinator Inpatient Diabetes Program [phone] (team pager from 8a-5p)

## 2020-09-24 NOTE — Progress Notes (Signed)
   09/24/20 1130  Psych Admission Type (Psych Patients Only)  Admission Status Voluntary  Psychosocial Assessment  Patient Complaints Anxiety  Eye Contact Fair  Facial Expression Flat  Affect Anxious  Speech Slow;Logical/coherent  Interaction Assertive  Motor Activity Slow  Appearance/Hygiene Disheveled  Behavior Characteristics Cooperative;Appropriate to situation  Mood Anxious  Thought Process  Coherency Concrete thinking  Content WDL  Delusions None reported or observed  Perception WDL  Hallucination None reported or observed  Judgment WDL  Confusion Mild  Danger to Self  Current suicidal ideation? Denies  Danger to Others  Danger to Others None reported or observed

## 2020-09-24 NOTE — BHH Counselor (Signed)
CSW spoke with [facility] APS at [phone] and made an Adult Protective Services Report.  CSW provided APS with all the patient's details and requested that CSW be notified by mail if the case is accepted.

## 2020-09-24 NOTE — Progress Notes (Signed)
[facility] MD Progress Note  09/24/2020 7:36 AM Julie Morrow  MRN:  [id number]   Chief Complaint: depression and SI  Subjective:  Julie Morrow is a 70 y.o. female with a past psychiatric history significant for bipolar d/o and anxiety, who was initially admitted for inpatient psychiatric hospitalization on 09/19/2020 for management of worsening depression and SI. The patient is currently on Hospital Day 5.   Chart Review from last 24 hours:  The patient's chart was reviewed and nursing notes were reviewed. The patient's case was discussed in multidisciplinary team meeting. Per nursing she had no behavioral issues or safety concerns noted.She did not attend groups. Per MAR she was compliant with scheduled medication except refusal of Melatonin. She did receive Trazodone X2 for sleep. She slept 6.5 hours.  Information Obtained Today During Patient Interview: The patient was seen and evaluated on the unit. On exam the patient is in bed. She states she is suffering with chronic right hip and knee pain and therefore does not want to ambulate today. She admits she has not requested any pain medications today and was encouraged to talk with staff about meds if needed. She was encouraged to get up with her walker and attend groups. She reports she showered yesterday and missed breakfast this morning. She reports stable appetite and improved sleep. She denies SI, HI, AVH, paranoia, ideas of reference, first rank symptoms, racing thoughts, grandiosity, or talkativeness. She denies medication side-effects. She states her mood is "a little less depressed" and I discussed the plans to titrate up on her Wellbutrin dose. She again denies h/o seizures to contraindicate Wellbutrin use. I discussed option of ALF after discharge, and she states she would like to return to her home. I discussed that she needs to get out of bed and participate in her treatment to show the team she is capable of self-care for  discharge.  Principal Problem: Bipolar depression (HCC) Diagnosis: Principal Problem:   Bipolar depression (HCC) Active Problems:   HLD (hyperlipidemia)   Hypothyroidism   Diabetes (HCC)  Total Time Spent in Direct Patient Care:  I personally spent 30 minutes on the unit in direct patient care. The direct patient care time included face-to-face time with the patient, reviewing the patient's chart, communicating with other professionals, and coordinating care. Greater than 50% of this time was spent in counseling or coordinating care with the patient regarding goals of hospitalization, psycho-education, and discharge planning needs.  Past Psychiatric History: see admission H&P  Past Medical History:  Past Medical History:  Diagnosis Date  . Anxiety   . Bipolar 1 disorder (HCC)   . Bradycardia   . Depression   . Diabetes mellitus, type II (HCC)    Patient takes Glucotrol and Januvia  . Hypothyroidism   . Lithium toxicity     Past Surgical History:  Procedure Laterality Date  . ABDOMINAL HYSTERECTOMY    . CHOLECYSTECTOMY     Family History:  Family History  Problem Relation Age of Onset  . Dementia Mother   . Arthritis Father    Family Psychiatric  History: see admission H&P  Social History:  Social History   Substance and Sexual Activity  Alcohol Use No  . Alcohol/week: 0.0 standard drinks     Social History   Substance and Sexual Activity  Drug Use No    Social History   Socioeconomic History  . Marital status: Single    Spouse name: Not on file  . Number of children: Not on  file  . Years of education: Not on file  . Highest education level: Not on file  Occupational History  . Not on file  Tobacco Use  . Smoking status: Current Every Day Smoker    Packs/day: 0.50    Years: 20.00    Pack years: 10.00    Types: Cigarettes    Start date: 11/06/1984  . Smokeless tobacco: Never Used  Vaping Use  . Vaping Use: Never used  Substance and Sexual Activity   . Alcohol use: No    Alcohol/week: 0.0 standard drinks  . Drug use: No  . Sexual activity: Not Currently  Other Topics Concern  . Not on file  Social History Narrative  . Not on file   Social Determinants of Health   Financial Resource Strain: Not on file  Food Insecurity: Not on file  Transportation Needs: Not on file  Physical Activity: Not on file  Stress: Not on file  Social Connections: Not on file   Sleep: Improved per patient self-report  Appetite:  Good  Current Medications: Current Facility-Administered Medications  Medication Dose Route Frequency Provider Last Rate Last Admin  . acetaminophen (TYLENOL) tablet 650 mg  650 mg Oral Q6H PRN Clapacs, Jackquline Denmark, MD   650 mg at 09/23/20 2107  . alum & mag hydroxide-simeth (MAALOX/MYLANTA) 200-200-20 MG/5ML suspension 30 mL  30 mL Oral Q4H PRN Clapacs, John T, MD      . aspirin EC tablet 81 mg  81 mg Oral Daily Clapacs, Jackquline Denmark, MD   81 mg at 09/23/20 0927  . benztropine (COGENTIN) tablet 0.5 mg  0.5 mg Oral BID Bartholomew Crews E, MD   0.5 mg at 09/23/20 1713  . buPROPion (WELLBUTRIN XL) 24 hr tablet 150 mg  150 mg Oral Daily Real Cona E, MD      . diphenoxylate-atropine (LOMOTIL) 2.5-0.025 MG per tablet 2 tablet  2 tablet Oral QID PRN Antonieta Pert, MD      . divalproex (DEPAKOTE) DR tablet 500 mg  500 mg Oral QPM Antonieta Pert, MD   500 mg at 09/23/20 1713  . fenofibrate tablet 160 mg  160 mg Oral Daily Clapacs, Jackquline Denmark, MD   160 mg at 09/23/20 0927  . glipiZIDE (GLUCOTROL) tablet 2.5 mg  2.5 mg Oral BID AC Antonieta Pert, MD   2.5 mg at 09/24/20 0640  . levothyroxine (SYNTHROID) tablet 175 mcg  175 mcg Oral Q0600 Comer Locket, MD   175 mcg at 09/24/20 0640  . loratadine (CLARITIN) tablet 10 mg  10 mg Oral Daily Clapacs, Jackquline Denmark, MD   10 mg at 09/23/20 0928  . magnesium hydroxide (MILK OF MAGNESIA) suspension 30 mL  30 mL Oral Daily PRN Clapacs, John T, MD      . metFORMIN (GLUCOPHAGE) tablet 500 mg  500 mg  Oral Q breakfast Clapacs, John T, MD   500 mg at 09/23/20 7893  . nicotine (NICODERM CQ - dosed in mg/24 hours) patch 14 mg  14 mg Transdermal Daily Antonieta Pert, MD   14 mg at 09/23/20 0930  . risperiDONE (RISPERDAL) tablet 6 mg  6 mg Oral QHS Clapacs, Jackquline Denmark, MD   6 mg at 09/23/20 2108  . rosuvastatin (CRESTOR) tablet 40 mg  40 mg Oral Daily Mason Jim, Jaselle Pryer E, MD   40 mg at 09/23/20 1407  . traZODone (DESYREL) tablet 100 mg  100 mg Oral QHS Comer Locket, MD  Lab Results:  Results for orders placed or performed during the hospital encounter of 09/19/20 (from the past 48 hour(s))  Glucose, capillary     Status: Abnormal   Collection Time: 09/22/20 12:00 PM  Result Value Ref Range   Glucose-Capillary 137 (H) 70 - 99 mg/dL    Comment: Glucose reference range applies only to samples taken after fasting for at least 8 hours.  Glucose, capillary     Status: None   Collection Time: 09/22/20  5:01 PM  Result Value Ref Range   Glucose-Capillary 96 70 - 99 mg/dL    Comment: Glucose reference range applies only to samples taken after fasting for at least 8 hours.  Glucose, capillary     Status: None   Collection Time: 09/23/20  5:52 AM  Result Value Ref Range   Glucose-Capillary 79 70 - 99 mg/dL    Comment: Glucose reference range applies only to samples taken after fasting for at least 8 hours.   Comment 1 Notify RN   Glucose, capillary     Status: None   Collection Time: 09/23/20 11:39 AM  Result Value Ref Range   Glucose-Capillary 87 70 - 99 mg/dL    Comment: Glucose reference range applies only to samples taken after fasting for at least 8 hours.  Glucose, capillary     Status: Abnormal   Collection Time: 09/23/20  4:58 PM  Result Value Ref Range   Glucose-Capillary 105 (H) 70 - 99 mg/dL    Comment: Glucose reference range applies only to samples taken after fasting for at least 8 hours.  Glucose, capillary     Status: Abnormal   Collection Time: 09/24/20  6:36 AM   Result Value Ref Range   Glucose-Capillary 104 (H) 70 - 99 mg/dL    Comment: Glucose reference range applies only to samples taken after fasting for at least 8 hours.    Blood Alcohol level:  Lab Results  Component Value Date   ETH <10 09/16/2020   ETH <10 12/03/2019    Metabolic Disorder Labs: Lab Results  Component Value Date   HGBA1C 6.5 (H) 09/16/2020   MPG 139.85 09/16/2020   MPG 128.37 12/28/2018   Lab Results  Component Value Date   PROLACTIN 15.1 07/03/2015   Lab Results  Component Value Date   CHOL 106 09/16/2020   TRIG 315 (H) 09/16/2020   HDL 28 (L) 09/16/2020   CHOLHDL 3.8 09/16/2020   VLDL 63 (H) 09/16/2020   LDLCALC 15 09/16/2020   LDLCALC 38 12/28/2018    Physical Findings: AIMS: Facial and Oral Movements Muscles of Facial Expression: None, normal Lips and Perioral Area: None, normal Jaw: None, normal Tongue: None, normal,Extremity Movements Upper (arms, wrists, hands, fingers): None, normal Lower (legs, knees, ankles, toes): None, normal, Trunk Movements Neck, shoulders, hips: None, normal, Overall Severity Severity of abnormal movements (highest score from questions above): None, normal Incapacitation due to abnormal movements: None, normal Patient's awareness of abnormal movements (rate only patient's report): No Awareness, Dental Status Current problems with teeth and/or dentures?: No Does patient usually wear dentures?: No      Musculoskeletal: Strength & Muscle Tone: within normal limits Gait & Station: untested Patient leans: N/A  Psychiatric Specialty Exam: Physical Exam Vitals reviewed.  HENT:     Head: Normocephalic.  Pulmonary:     Effort: Pulmonary effort is normal.  Musculoskeletal:     Comments: Nonspecific tenderness in right knee with palpation but no swelling or redness noted. She is able to  dorsi and plantar flex and do straight leg raise without difficulty or noted pain. Hip was nontender to palpation.  Neurological:      Mental Status: She is alert.     Review of Systems  Respiratory: Negative for shortness of breath.   Cardiovascular: Negative for chest pain.  Gastrointestinal: Negative for nausea and vomiting.  Musculoskeletal:       Nonspecific right knee and hip pain    BP 107/80, HR 85, Temp 97.7, sats 97% RA  General Appearance: obese, casually dressed with fair hygiene  Eye Contact:  Fair  Speech:  Clear and Coherent and Normal Rate  Volume:  Normal  Mood:  Dysphoric  Affect:  Constricted  Thought Process:  Superficially goal directed, more linear  Orientation:  Oriented to month, year, President, self  Thought Content:  Denies AVH, paranoia, ideas of reference, of first rank symptoms; no acute psychosis on exam  Suicidal Thoughts:  No  Homicidal Thoughts:  No  Memory:  Recent;   Fair  Judgement:  Fair  Insight:  Lacking  Psychomotor Activity:  Decreased   Concentration:  Concentration: Fair  Recall:  Poor  Fund of Knowledge:  Fair  Language:  Fair  Akathisia:  Negative  Assets:  Desire for Improvement Resilience  ADL's:  independent  Sleep:  Number of Hours: 6.5   Treatment Plan Summary: Diagnoses / Active Problems: Bipolar d/o MRE depressed Type II DM Hypothyroidism Hypertriglyceridemia Tobacco Use d/o  PLAN: 1. Safety and Monitoring:  -- Voluntary admission to inpatient psychiatric unit for safety, stabilization and treatment  -- Daily contact with patient to assess and evaluate symptoms and progress in treatment  -- Patient's case to be discussed in multi-disciplinary team meeting  -- Observation Level : q15 minute checks  -- Vital signs:  q12 hours  -- Precautions: suicide  2. Psychiatric Diagnoses and Treatment:   Bipolar d/o MRE depressed -- Continue Cogentin 0.5mg  bid for side effects of medication -- Continue Risperdal 6mg  po qhs for mood stability and psychosis -- Continue Depakote 500mg  every afternoon for mood stability (AST 38, ALT 32, WBC 11.7, H/H  15.9/46.3, platelets 192) checking VPA level, CBC and LFTs on Friday -- Schedule Trazodone 100mg  po qhs for insomnia and discontinue Melatonin -- Increase Wellbutrin to XL 150mg  daily for depression and monitor for symptom improvement with recent dose increase  -- Metabolic profile and EKG monitoring obtained while on an atypical antipsychotic (BMI:44.02;  Lipid Panel: cholesterol 106, triglycerides 315, HDL 28, LDL 15; HbgA1c: 6.5; QTc:475ms)   -- Encouraged patient to participate in unit milieu and in scheduled group therapies   -- Short Term Goals: Ability to identify changes in lifestyle to reduce recurrence of condition will improve and Ability to identify and develop effective coping behaviors will improve  -- Long Term Goals: Improvement in symptoms so as ready for discharge   3. Medical Issues Being Addressed:   Hypertriglyceridemia  -- Continue Fenofibrate 160mg  daily  -- Continue Crestor 40mg  daily    Type II DM  -- Continue Glipizide 2.5mg  po bid  -- Continue Glucophage 500mg  daily  -- Checking FSBS tid and consulted diabetes coordinator   Hypothyroidism  -- Continue Synthroid Friday daily  -- TSH 1.116   Seasonal Allergies  -- Continue Claritin 10mg  daily   Tobacco Use d/o  -- Smoking cessation encouraged  -- Continue Nicoderm patch 14mg /24 hours   Age Indeterminate septal infarct on EKG  -- Patient will remain on ASA 81mg  daily  -- Patient  currently asymptomatic - spoke with Dr. Eden Emms, on call for cardiology, who feels was likely lead placement and he recommends f/u with PCP as an outpatient - no further inpatient w/u needed at this time  -- Holding restart of Cozaar since patient has not been compliant with med prior to admission and monitoring blood pressures   High Risk Medication Use: -- The complexity of this patient's case involves drug therapy with Depakote which requires intensive monitoring for toxicity. This is in part due to a narrow therapeutic window as  well as potential for toxicity with this medication.   4. Discharge Planning:   -- Social work and case management to assist with discharge planning and identification of hospital follow-up needs prior to discharge - APS referral pending and asked patient to consider ALF referral  -- Estimated LOS: 3-4 days  -- Discharge Concerns: Need to establish a safety plan; Medication compliance and effectiveness  -- Discharge Goals: Return home with outpatient referrals for mental health follow-up including medication management/psychotherapy  Comer Locket, MD, FAPA 09/24/2020, 7:36 AM

## 2020-09-25 ENCOUNTER — Inpatient Hospital Stay (HOSPITAL_COMMUNITY): Payer: Medicare Other

## 2020-09-25 ENCOUNTER — Encounter (HOSPITAL_COMMUNITY): Payer: Self-pay | Admitting: Psychiatry

## 2020-09-25 LAB — CBC WITH DIFFERENTIAL/PLATELET
Abs Immature Granulocytes: 0.02 10*3/uL (ref 0.00–0.07)
Basophils Absolute: 0 10*3/uL (ref 0.0–0.1)
Basophils Relative: 1 %
Eosinophils Absolute: 0.1 10*3/uL (ref 0.0–0.5)
Eosinophils Relative: 1 %
HCT: 47.4 % — ABNORMAL HIGH (ref 36.0–46.0)
Hemoglobin: 15.2 g/dL — ABNORMAL HIGH (ref 12.0–15.0)
Immature Granulocytes: 0 %
Lymphocytes Relative: 32 %
Lymphs Abs: 2.5 10*3/uL (ref 0.7–4.0)
MCH: 31.1 pg (ref 26.0–34.0)
MCHC: 32.1 g/dL (ref 30.0–36.0)
MCV: 96.9 fL (ref 80.0–100.0)
Monocytes Absolute: 0.6 10*3/uL (ref 0.1–1.0)
Monocytes Relative: 8 %
Neutro Abs: 4.5 10*3/uL (ref 1.7–7.7)
Neutrophils Relative %: 58 %
Platelets: 160 10*3/uL (ref 150–400)
RBC: 4.89 MIL/uL (ref 3.87–5.11)
RDW: 13.8 % (ref 11.5–15.5)
WBC: 7.8 10*3/uL (ref 4.0–10.5)
nRBC: 0 % (ref 0.0–0.2)

## 2020-09-25 LAB — COMPREHENSIVE METABOLIC PANEL
ALT: 21 U/L (ref 0–44)
AST: 19 U/L (ref 15–41)
Albumin: 4.2 g/dL (ref 3.5–5.0)
Alkaline Phosphatase: 53 U/L (ref 38–126)
Anion gap: 11 (ref 5–15)
BUN: 17 mg/dL (ref 8–23)
CO2: 24 mmol/L (ref 22–32)
Calcium: 9.8 mg/dL (ref 8.9–10.3)
Chloride: 105 mmol/L (ref 98–111)
Creatinine, Ser: 0.91 mg/dL (ref 0.44–1.00)
GFR, Estimated: >60 mL/min (ref >60)
Glucose, Bld: 100 mg/dL — ABNORMAL HIGH (ref 70–99)
Potassium: 4.4 mmol/L (ref 3.5–5.1)
Sodium: 140 mmol/L (ref 135–145)
Total Bilirubin: 0.3 mg/dL (ref 0.3–1.2)
Total Protein: 7.3 g/dL (ref 6.5–8.1)

## 2020-09-25 LAB — GLUCOSE, CAPILLARY
Glucose-Capillary: 81 mg/dL (ref 70–99)
Glucose-Capillary: 111 mg/dL — ABNORMAL HIGH (ref 70–99)

## 2020-09-25 LAB — AMMONIA: Ammonia: 19 umol/L (ref 9–35)

## 2020-09-25 MED ORDER — RISPERIDONE 2 MG PO TABS
4.0000 mg | ORAL_TABLET | Freq: Every day | ORAL | Status: DC
  Administered 2020-09-25 – 2020-10-06 (×12): 4 mg via ORAL
  Filled 2020-09-25 (×14): qty 2

## 2020-09-25 MED ORDER — TRAZODONE HCL 100 MG PO TABS: 100.0000 mg | ORAL_TABLET | Freq: Every evening | ORAL | Status: DC | PRN

## 2020-09-25 MED ORDER — GLIPIZIDE 2.5 MG HALF TABLET
2.5000 mg | ORAL_TABLET | Freq: Every day | ORAL | Status: DC
  Filled 2020-09-25: qty 1

## 2020-09-25 NOTE — ED Notes (Signed)
Report and transport called

## 2020-09-25 NOTE — ED Triage Notes (Signed)
Patient does not know why she is here-only complaint is right leg pain-denies injury or falls

## 2020-09-25 NOTE — ED Provider Notes (Signed)
[facility]-EMERGENCY DEPT Provider Note   CSN: [id number] Arrival date & time: 09/25/20  1211     History Chief Complaint  Patient presents with  . bipolar disorder  . Fall    Julie Morrow is a 70 y.o. female with a history of diabetes, COPD, hypothyroidism, bipolar 1 disorder.  Patient presents to the emergency department from behavioral health inpatient admission.  Per triage note patient was sent from behavioral health due to having multiple unwitnessed falls and possibly hitting her head.  Providers were concerned that patient had some confusion.  Patient found to be alert to person, place, and time.  Patient is a poor historian and is unable to give any details about her falls.  Patient complains of pain to her entire right leg.  Pain 10/10 on the pain scale.  Patient denies any alleviating or aggravating factors.  Patient denies any neck pain, back pain, bowel or bladder dysfunction, numbness extremities, weakness to extremities, visual disturbance, abdominal pain, nausea, vomiting.  HPI     Past Medical History:  Diagnosis Date  . Anxiety   . Bipolar 1 disorder (HCC)   . Bradycardia   . Depression   . Diabetes mellitus, type II (HCC)    Patient takes Glucotrol and Januvia  . Hypothyroidism   . Lithium toxicity     Patient Active Problem List   Diagnosis Date Noted  . Diabetes (HCC) 09/23/2020  . Adjustment disorder with anxiety 09/13/2020  . Family discord 12/05/2019  . AMS (altered mental status) 05/14/2019  . Acute lower UTI 05/14/2019  . Adjustment disorder with mixed disturbance of emotions and conduct   . Bipolar I disorder with mania (HCC) 12/27/2018  . PAD (peripheral artery disease) (HCC) 10/16/2018  . GAD (generalized anxiety disorder) 03/04/2018  . Insomnia 03/04/2018  . Situational depression 03/04/2018  . Bipolar depression (HCC) 03/04/2018  . Acute kidney injury (HCC) 05/30/2017  . Bradycardia   . Lithium toxicity   .  COPD GOLD 0 / still smoking  12/22/2016  . Generalized weakness 10/27/2015  . GERD (gastroesophageal reflux disease) 10/15/2015  . Bipolar 1 disorder, mixed, severe (HCC) 10/11/2015  . Diabetes type 2, controlled (HCC) 07/07/2015  . Morbid (severe) obesity due to excess calories (HCC) 07/07/2015  . Cigarette smoker 07/07/2015  . Hypothyroidism 07/07/2015  . HLD (hyperlipidemia) 11/07/2014  . Psoriasis 11/07/2014    Past Surgical History:  Procedure Laterality Date  . ABDOMINAL HYSTERECTOMY    . CHOLECYSTECTOMY       OB History   No obstetric history on file.     Family History  Problem Relation Age of Onset  . Dementia Mother   . Arthritis Father     Social History   Tobacco Use  . Smoking status: Current Every Day Smoker    Packs/day: 0.50    Years: 20.00    Pack years: 10.00    Types: Cigarettes    Start date: 11/06/1984  . Smokeless tobacco: Never Used  Vaping Use  . Vaping Use: Never used  Substance Use Topics  . Alcohol use: No    Alcohol/week: 0.0 standard drinks  . Drug use: No    Home Medications Prior to Admission medications   Medication Sig Start Date End Date Taking? Authorizing Provider  aspirin 81 MG chewable tablet Chew 1 tablet (81 mg total) by mouth daily. Patient not taking: No sig reported 01/05/19   Clapacs, Jackquline Denmark, MD  CINNAMON PO Take 1 tablet by mouth daily.  [provider]  divalproex (DEPAKOTE ER) 250 MG 24 hr tablet Take 750 mg by mouth at bedtime. 10/01/19   [provider]  fenofibrate 160 MG tablet Take 1 tablet (160 mg total) by mouth daily with breakfast. Patient not taking: No sig reported 01/05/19   Clapacs, John T, MD  FEROSUL 325 (65 Fe) MG tablet Take 325 mg by mouth daily. 10/01/19   [provider]  hydrOXYzine (ATARAX/VISTARIL) 10 MG tablet Take 10 mg by mouth 3 (three) times daily as needed. Take 10 mg by mouth three times daily as needed for anxiety and 30 mg at bedtime as needed for sleep  09/13/20   [provider]  levothyroxine (SYNTHROID) 175 MCG tablet Take 175 mcg by mouth daily. 11/02/19   [provider]  levothyroxine (SYNTHROID) 200 MCG tablet Take 1 tablet (200 mcg total) by mouth daily before breakfast. Patient not taking: No sig reported 01/05/19   Clapacs, Jackquline Denmark, MD  loratadine (CLARITIN) 10 MG tablet Take 1 tablet (10 mg total) by mouth daily. Patient not taking: No sig reported 01/05/19   Clapacs, Jackquline Denmark, MD  losartan (COZAAR) 25 MG tablet Take 25 mg by mouth daily. 08/26/20   [provider]  melatonin 5 MG TABS Take 5-10 mg by mouth at bedtime.    [provider]  metFORMIN (GLUCOPHAGE) 500 MG tablet Take 1 tablet (500 mg total) by mouth daily with breakfast. Patient not taking: No sig reported 01/05/19   Clapacs, Jackquline Denmark, MD  Multiple Vitamins-Minerals (CENTRUM) tablet Take 1 tablet by mouth daily.    [provider]  polyvinyl alcohol (LIQUIFILM TEARS) 1.4 % ophthalmic solution Place 1 drop into both eyes as needed for dry eyes.    [provider]  Probiotic Product (PROBIOTIC PO) Take 1 capsule by mouth daily.    [provider]  risperiDONE (RISPERDAL) 3 MG tablet Take 2 tablets (6 mg total) by mouth at bedtime. Patient not taking: No sig reported 01/05/19   Clapacs, Jackquline Denmark, MD  rosuvastatin (CRESTOR) 40 MG tablet Take 40 mg by mouth daily. 12/31/18   [provider]  RYBELSUS 14 MG TABS Take 1 tablet by mouth every morning. 08/26/20   [provider]  traZODone (DESYREL) 50 MG tablet Take 50 mg by mouth at bedtime as needed for sleep. 10/24/19   [provider]    Allergies    Patient has no known allergies.  Review of Systems   Review of Systems  Constitutional: Negative for chills and fever.  Eyes: Negative for visual disturbance.  Respiratory: Negative for shortness of breath.   Cardiovascular: Negative for chest pain.  Gastrointestinal: Negative for abdominal pain,  nausea and vomiting.  Genitourinary: Negative for difficulty urinating and dysuria.  Musculoskeletal: Negative for back pain and neck pain.  Skin: Negative for color change and rash.  Neurological: Negative for dizziness, syncope, light-headedness and headaches.  Psychiatric/Behavioral: Negative for confusion.    Physical Exam Updated Vital Signs BP 120/67 (BP Location: Left Arm)   Pulse 60   Temp 98.2 F (36.8 C) (Oral)   Resp 17   Ht  (1.549 m)   Wt 105.7 kg   SpO2 98%   BMI 44.02 kg/m   Physical Exam Vitals and nursing note reviewed.  Constitutional:      General: She is not in acute distress.    Appearance: She is not ill-appearing, toxic-appearing or diaphoretic.  HENT:     Head: Normocephalic and atraumatic.  No raccoon eyes, abrasion, contusion, masses, right periorbital erythema, left periorbital erythema or laceration.     Mouth/Throat:     Pharynx: Oropharynx is clear. Uvula midline. No pharyngeal swelling, oropharyngeal exudate, posterior oropharyngeal erythema or uvula swelling.  Eyes:     General: No scleral icterus.       Right eye: No discharge.        Left eye: No discharge.     Extraocular Movements: Extraocular movements intact.     Pupils: Pupils are equal, round, and reactive to light.  Cardiovascular:     Rate and Rhythm: Normal rate.     Pulses:          Dorsalis pedis pulses are 3+ on the right side.       Posterior tibial pulses are 3+ on the right side.  Pulmonary:     Effort: Pulmonary effort is normal.  Abdominal:     General: There is no distension. There are no signs of injury.     Palpations: Abdomen is soft.     Tenderness: There is no abdominal tenderness.  Musculoskeletal:     Cervical back: Normal range of motion and neck supple. No swelling, edema, deformity, erythema, signs of trauma, lacerations, rigidity, spasms, torticollis, tenderness, bony tenderness or crepitus. No pain with movement, spinous process tenderness or muscular  tenderness. Normal range of motion.     Thoracic back: No swelling, edema, deformity, signs of trauma, lacerations, spasms, tenderness or bony tenderness.     Lumbar back: No swelling, edema, deformity, signs of trauma, lacerations, spasms, tenderness or bony tenderness.     Right hip: Tenderness and bony tenderness present. No deformity or crepitus.     Left hip: No deformity, tenderness, bony tenderness or crepitus.     Right knee: No swelling, deformity, effusion, erythema, ecchymosis or lacerations. Normal range of motion. No tenderness.     Right lower leg: No swelling, deformity, lacerations, tenderness or bony tenderness. No edema.     Left lower leg: No swelling, deformity, lacerations, tenderness or bony tenderness. No edema.     Right ankle: No swelling, deformity, ecchymosis or lacerations. No tenderness. Normal range of motion. Normal pulse.     Left ankle: No swelling, deformity, ecchymosis or lacerations. No tenderness. Normal range of motion. Normal pulse.     Comments: Patient complains of diffuse tenderness throughout her entire right leg.  Patient has full range of motion to right knee and +5 strength knee flexion and extension.  Feet:     Right foot:     Skin integrity: Callus and dry skin present. No ulcer, blister, skin breakdown, erythema, warmth or fissure.     Toenail Condition: Right toenails are abnormally thick.  Skin:    General: Skin is warm and dry.  Neurological:     General: No focal deficit present.     Mental Status: She is alert and oriented to person, place, and time.     GCS: GCS eye subscore is 4. GCS verbal subscore is 5. GCS motor subscore is 6.     Cranial Nerves: No cranial nerve deficit or facial asymmetry.     Sensory: Sensation is intact.     Motor: No weakness, tremor, seizure activity or pronator drift.     Coordination: Finger-Nose-Finger Test normal.     Comments: CN II-XII intact, equal grip strength, +5 strength to bilateral upper and lower  extremities   Psychiatric:        Behavior: Behavior is cooperative.  ED Results / Procedures / Treatments   Labs (all labs ordered are listed, but only abnormal results are displayed) Labs Reviewed  GLUCOSE, CAPILLARY - Abnormal; Notable for the following components:      Result Value   Glucose-Capillary 61 (*)    All other components within normal limits  GLUCOSE, CAPILLARY - Abnormal; Notable for the following components:   Glucose-Capillary 140 (*)    All other components within normal limits  GLUCOSE, CAPILLARY - Abnormal; Notable for the following components:   Glucose-Capillary 101 (*)    All other components within normal limits  GLUCOSE, CAPILLARY - Abnormal; Notable for the following components:   Glucose-Capillary 137 (*)    All other components within normal limits  GLUCOSE, CAPILLARY - Abnormal; Notable for the following components:   Glucose-Capillary 105 (*)    All other components within normal limits  GLUCOSE, CAPILLARY - Abnormal; Notable for the following components:   Glucose-Capillary 104 (*)    All other components within normal limits  GLUCOSE, CAPILLARY - Abnormal; Notable for the following components:   Glucose-Capillary 121 (*)    All other components within normal limits  GLUCOSE, CAPILLARY  GLUCOSE, CAPILLARY  GLUCOSE, CAPILLARY  GLUCOSE, CAPILLARY  GLUCOSE, CAPILLARY  GLUCOSE, CAPILLARY  GLUCOSE, CAPILLARY  AMMONIA  CBC WITH DIFFERENTIAL/PLATELET  HEPATIC FUNCTION PANEL  VALPROIC ACID LEVEL  AMMONIA  CBC WITH DIFFERENTIAL/PLATELET  COMPREHENSIVE METABOLIC PANEL    EKG None  Radiology CT Head Wo Contrast  Result Date: 09/25/2020 CLINICAL DATA:  Neck and head trauma status post fall last night EXAM: CT HEAD WITHOUT CONTRAST CT CERVICAL SPINE WITHOUT CONTRAST TECHNIQUE: Multidetector CT imaging of the head and cervical spine was performed following the standard protocol without intravenous contrast. Multiplanar CT image  reconstructions of the cervical spine were also generated. COMPARISON:  10/24/2018 FINDINGS: CT HEAD FINDINGS Brain: No evidence of acute infarction, hemorrhage, hydrocephalus, extra-axial collection or mass lesion/mass effect. Right posterior occipital craniectomy changes again seen with underlying encephalomalacia. Periventricular white matter hypodensity is a nonspecific finding, but most commonly relates to chronic ischemic small vessel disease. Vascular: No hyperdense vessel or unexpected calcification. Skull: Normal. Negative for fracture or focal lesion. Sinuses/Orbits: No acute finding. Other: None. CT CERVICAL SPINE FINDINGS Alignment: There is reversal of normal cervical lordosis with apex at C5. Skull base and vertebrae: No acute fracture. No primary bone lesion or focal pathologic process. Soft tissues and spinal canal: Thyroid is atrophic versus absent. Mediastinal lipomatosis partially visualized. Prevertebral soft tissues otherwise unremarkable. Disc levels: Degenerative changes are seen throughout the cervical spine, greatest at C5-C6 with posterior spondylotic ridge causing moderate bilateral neural foraminal stenosis and indentation of the ventral thecal sac. Upper chest: Negative. Other: None IMPRESSION: 1. No acute intracranial abnormality. 2. No acute fracture or dislocation of the cervical or visualized upper thoracic spine. Electronically Signed   By: Acquanetta BellingFarhaan  Mir M.D.   On: 09/25/2020 14:09   CT Cervical Spine Wo Contrast  Result Date: 09/25/2020 CLINICAL DATA:  Neck and head trauma status post fall last night EXAM: CT HEAD WITHOUT CONTRAST CT CERVICAL SPINE WITHOUT CONTRAST TECHNIQUE: Multidetector CT imaging of the head and cervical spine was performed following the standard protocol without intravenous contrast. Multiplanar CT image reconstructions of the cervical spine were also generated. COMPARISON:  10/24/2018 FINDINGS: CT HEAD FINDINGS Brain: No evidence of acute infarction,  hemorrhage, hydrocephalus, extra-axial collection or mass lesion/mass effect. Right posterior occipital craniectomy changes again seen with underlying encephalomalacia. Periventricular white matter hypodensity is a  nonspecific finding, but most commonly relates to chronic ischemic small vessel disease. Vascular: No hyperdense vessel or unexpected calcification. Skull: Normal. Negative for fracture or focal lesion. Sinuses/Orbits: No acute finding. Other: None. CT CERVICAL SPINE FINDINGS Alignment: There is reversal of normal cervical lordosis with apex at C5. Skull base and vertebrae: No acute fracture. No primary bone lesion or focal pathologic process. Soft tissues and spinal canal: Thyroid is atrophic versus absent. Mediastinal lipomatosis partially visualized. Prevertebral soft tissues otherwise unremarkable. Disc levels: Degenerative changes are seen throughout the cervical spine, greatest at C5-C6 with posterior spondylotic ridge causing moderate bilateral neural foraminal stenosis and indentation of the ventral thecal sac. Upper chest: Negative. Other: None IMPRESSION: 1. No acute intracranial abnormality. 2. No acute fracture or dislocation of the cervical or visualized upper thoracic spine. Electronically Signed   By: Acquanetta Belling M.D.   On: 09/25/2020 14:09   DG Knee Complete 4 Views Right  Result Date: 09/25/2020 CLINICAL DATA:  Pain following fall EXAM: RIGHT KNEE - COMPLETE 4+ VIEW COMPARISON:  None. FINDINGS: Frontal, lateral, and bilateral oblique views were obtained. There is no appreciable acute fracture. There is a focus of ossification along the superior patella with well corticated margins suggesting residua of prior trauma. No dislocation. Small joint effusion present. There is mild spurring laterally. There is mild joint space narrowing medially. No erosion. IMPRESSION: No acute fracture or dislocation. Question prior trauma along the superior patella with well corticated bony focus  slightly superior to the posterior patella. Small joint effusion is noted. Joint space narrowing medially. Mild spurring laterally. No erosions. Electronically Signed   By: Bretta Bang III M.D.   On: 09/25/2020 13:51   DG Hip Unilat W or Wo Pelvis 2-3 Views Right  Result Date: 09/25/2020 CLINICAL DATA:  Pain following fall EXAM: DG HIP (WITH OR WITHOUT PELVIS) 2-3V RIGHT COMPARISON:  None. FINDINGS: Frontal pelvis as well as frontal and lateral right hip images were obtained. No fracture or dislocation. No appreciable joint space narrowing or erosion. Slight bony overgrowth along each superolateral acetabulum noted. IMPRESSION: Slight bony overgrowth along each superolateral acetabulum potentially places patient at increased risk for femoroacetabular impingement. No fracture or dislocation. No appreciable joint space narrowing or erosion. Electronically Signed   By: Bretta Bang III M.D.   On: 09/25/2020 13:49    Procedures Procedures   Medications Ordered in ED Medications  metFORMIN (GLUCOPHAGE) tablet 500 mg (500 mg Oral Given 09/25/20 0944)  loratadine (CLARITIN) tablet 10 mg (10 mg Oral Given 09/25/20 0943)  fenofibrate tablet 160 mg (160 mg Oral Given 09/25/20 0939)  aspirin EC tablet 81 mg (81 mg Oral Given 09/25/20 0938)  acetaminophen (TYLENOL) tablet 650 mg (650 mg Oral Given 09/24/20 1722)  alum & mag hydroxide-simeth (MAALOX/MYLANTA) 200-200-20 MG/5ML suspension 30 mL (has no administration in time range)  magnesium hydroxide (MILK OF MAGNESIA) suspension 30 mL (has no administration in time range)  glucose 4 GM chewable tablet (has no administration in time range)  nicotine (NICODERM CQ - dosed in mg/24 hours) patch 14 mg (14 mg Transdermal Patch Applied 09/25/20 0942)  nicotine (NICODERM CQ - dosed in mg/24 hours) 14 mg/24hr patch (  Not Given 09/21/20 1713)  divalproex (DEPAKOTE) DR tablet 500 mg (500 mg Oral Given 09/24/20 1731)  diphenoxylate-atropine (LOMOTIL) 2.5-0.025 MG  per tablet 2 tablet (has no administration in time range)  benztropine (COGENTIN) tablet 0.5 mg (0.5 mg Oral Given 09/25/20 0939)  rosuvastatin (CRESTOR) tablet 40 mg (40 mg Oral  Given 09/25/20 0955)  levothyroxine (SYNTHROID) tablet 175 mcg (175 mcg Oral Given 09/25/20 0956)  buPROPion (WELLBUTRIN XL) 24 hr tablet 150 mg (150 mg Oral Given 09/25/20 0939)  nystatin (MYCOSTATIN/NYSTOP) topical powder (1 Units Topical Given 09/25/20 0940)  risperiDONE (RISPERDAL) tablet 4 mg (has no administration in time range)  traZODone (DESYREL) tablet 100 mg (has no administration in time range)  glipiZIDE (GLUCOTROL) tablet 2.5 mg (has no administration in time range)  traZODone (DESYREL) tablet 50 mg (50 mg Oral Given 09/23/20 2137)    ED Course  I have reviewed the triage vital signs and the nursing notes.  Pertinent labs & imaging results that were available during my care of the patient were reviewed by me and considered in my medical decision making (see chart for details).    MDM Rules/Calculators/A&P                          70 year old female no acute distress, nontoxic-appearing.  Patient presents from behavioral health unit after reports of multiple unwitnessed falls and confusion.  Patient is a poor historian and does not have any details of her falls.  Patient does endorse diffuse right leg pain.  Sitter at bedside from behavioral health unit with patient.  Patient found to be alert and oriented x3.  Patient has no midline tenderness or deformity to cervical, thoracic, lumbar spine.  Patient head is atraumatic.  Patient has diffuse tenderness throughout her right leg upon initial assessment.  Patient has +3 dorsalis pedis and posterior tibialis pulse, no deformity noted to right lower extremity, patient has full range of motion to right ankle, right knee, and is able to perform straight leg raise without difficulty.  Low suspicion for DVT as patient has no swelling or edema to right lower  extremity.  Will obtain noncontrast CT of head and cervical spine.  Will obtain x-ray imaging of right hip and right knee.  Will obtain ammonia levels, CBC, CMP.    Noncontrast CT of head and cervical spine showed no acute intracranial abnormality, no acute fracture or dislocation of the cervical or visualized upper thoracic spine.  X-ray imaging of right hip showed no acute fracture or dislocation.  Slight bony overgrowth along each superior lateral acetabulum potentially places patient at increased risk for femoral acetabular impingement.  X-ray imaging of right knee shows no acute fracture or dislocation.  Small joint effusion noted.  Transverse narrowing medially, mild spurring laterally, no erosions.  CMP is unremarkable. CBC shows slight increase in hemoglobin and hematocrit. Ammonia within normal limits.  On serial repeat examination patient is alert and oriented.  Patient has no tenderness to right lower extremity on repeat examinations.  Patient hemodynamically stable.  Will discharge patient back to behavioral health unit at this time.  Final Clinical Impression(s) / ED Diagnoses Final diagnoses:  Fall, initial encounter  Right leg pain    Rx / DC Orders ED Discharge Orders    None       Berneice Heinrich 09/26/20 [id number]    Gerhard Munch, MD 09/30/20 [phone]

## 2020-09-25 NOTE — Progress Notes (Signed)
Writer was doing checks when they heard pt walker slam loudly. Writer enters pt room to see them attempting to use the the walker backwards. Writer assisted pt with using the walker correctly and assisted the pt to the bathroom. While continuing Regulatory affairs officer heard pt yell out "nurse! I need help, Nurse!" Writer rushed to pt room where they discovered pt on the floor in a sitting position. Writer grabbed Nurse for assistance with getting pt up from the floor to the toilet. Writer tried to asked pt how they got on the floor, if they became light headed or did they miss the toilet seat. Pt was unable to communicate. Writer and RN assisted pt to the bed and tried to talk with the pt. Pt was speaking incoherently and writer was unable to understand pt. Pt appeared to be confused about their fall and what lead to it. Pt was placed on a 1:1 for safety.

## 2020-09-25 NOTE — Progress Notes (Signed)
[facility] MD Progress Note  09/25/2020 9:02 AM Julie Morrow  MRN:  [id number]   Chief Complaint: depression and SI  Subjective:  Julie Morrow is a 70 y.o. female with a past psychiatric history significant for bipolar d/o and anxiety, who was initially admitted for inpatient psychiatric hospitalization on 09/19/2020 for management of worsening depression and SI. The patient is currently on Hospital Day 6.   Chart Review from last 24 hours:  The patient's chart was reviewed and nursing notes were reviewed. The patient's case was discussed in multidisciplinary team meeting. Per nursing, overnight the patient was more confused and was attempting to enter another patient's bathroom. She required redirection by staff when attempting to use her walker backwards. She was later found in the floor with unwitnessed fall around 1am at which time she was placed on a 1:1. Later in the night she woke to go to the bathroom and instead of sitting on the commode sat on the floor and required 4 person assist to get up. Per nursing, skin assessment shows no bruising. This morning, the patient has had periods of confusion. Per [facility] she was compliant with scheduled medications.  Information Obtained Today During Patient Interview: The patient was seen and evaluated on the unit. She is generally a poor historian and is vague in her answers on exam. She does not seem to recall events of last night but states she thinks she fell yesterday morning. She does not know if she hit her head or had LOC. She denies HA, CP, SOB, dizziness or focal neuro complaints. She denies current issues with pain. She states her sleep was fair and her appetite has been fair. She denies current issues with depressed mood, SI, HI, AVH, paranoia, ideas of reference, or first rank symptoms. She is oriented to person, date, place, and Economist. She denies medication side-effects.   Principal Problem: Bipolar depression (HCC) Diagnosis: Principal  Problem:   Bipolar depression (HCC) Active Problems:   HLD (hyperlipidemia)   Hypothyroidism   Diabetes (HCC)  Total Time Spent in Direct Patient Care:  I personally spent 35 minutes on the unit in direct patient care. The direct patient care time included face-to-face time with the patient, reviewing the patient's chart, communicating with other professionals, and coordinating care. Greater than 50% of this time was spent in counseling or coordinating care with the patient regarding goals of hospitalization, psycho-education, and discharge planning needs.  Past Psychiatric History: see admission H&P  Past Medical History:  Past Medical History:  Diagnosis Date  . Anxiety   . Bipolar 1 disorder (HCC)   . Bradycardia   . Depression   . Diabetes mellitus, type II (HCC)    Patient takes Glucotrol and Januvia  . Hypothyroidism   . Lithium toxicity     Past Surgical History:  Procedure Laterality Date  . ABDOMINAL HYSTERECTOMY    . CHOLECYSTECTOMY     Family History:  Family History  Problem Relation Age of Onset  . Dementia Mother   . Arthritis Father    Family Psychiatric  History: see admission H&P  Social History:  Social History   Substance and Sexual Activity  Alcohol Use No  . Alcohol/week: 0.0 standard drinks     Social History   Substance and Sexual Activity  Drug Use No    Social History   Socioeconomic History  . Marital status: Single    Spouse name: Not on file  . Number of children: Not on file  .  Years of education: Not on file  . Highest education level: Not on file  Occupational History  . Not on file  Tobacco Use  . Smoking status: Current Every Day Smoker    Packs/day: 0.50    Years: 20.00    Pack years: 10.00    Types: Cigarettes    Start date: 11/06/1984  . Smokeless tobacco: Never Used  Vaping Use  . Vaping Use: Never used  Substance and Sexual Activity  . Alcohol use: No    Alcohol/week: 0.0 standard drinks  . Drug use: No  .  Sexual activity: Not Currently  Other Topics Concern  . Not on file  Social History Narrative  . Not on file   Social Determinants of Health   Financial Resource Strain: Not on file  Food Insecurity: Not on file  Transportation Needs: Not on file  Physical Activity: Not on file  Stress: Not on file  Social Connections: Not on file   Sleep: Fair  Appetite:  Fair  Current Medications: Current Facility-Administered Medications  Medication Dose Route Frequency Provider Last Rate Last Admin  . acetaminophen (TYLENOL) tablet 650 mg  650 mg Oral Q6H PRN Clapacs, Jackquline Denmark, MD   650 mg at 09/24/20 1722  . alum & mag hydroxide-simeth (MAALOX/MYLANTA) 200-200-20 MG/5ML suspension 30 mL  30 mL Oral Q4H PRN Clapacs, John T, MD      . aspirin EC tablet 81 mg  81 mg Oral Daily Clapacs, Jackquline Denmark, MD   81 mg at 09/24/20 0926  . benztropine (COGENTIN) tablet 0.5 mg  0.5 mg Oral BID Mason Jim, Klaryssa Fauth E, MD   0.5 mg at 09/24/20 1658  . buPROPion (WELLBUTRIN XL) 24 hr tablet 150 mg  150 mg Oral Daily Bartholomew Crews E, MD   150 mg at 09/24/20 0928  . diphenoxylate-atropine (LOMOTIL) 2.5-0.025 MG per tablet 2 tablet  2 tablet Oral QID PRN Antonieta Pert, MD      . divalproex (DEPAKOTE) DR tablet 500 mg  500 mg Oral QPM Antonieta Pert, MD   500 mg at 09/24/20 1731  . fenofibrate tablet 160 mg  160 mg Oral Daily Clapacs, Jackquline Denmark, MD   160 mg at 09/24/20 0926  . glipiZIDE (GLUCOTROL) tablet 2.5 mg  2.5 mg Oral BID AC Antonieta Pert, MD   2.5 mg at 09/24/20 1658  . levothyroxine (SYNTHROID) tablet 175 mcg  175 mcg Oral Q0600 Comer Locket, MD   175 mcg at 09/24/20 0640  . loratadine (CLARITIN) tablet 10 mg  10 mg Oral Daily Clapacs, Jackquline Denmark, MD   10 mg at 09/24/20 0926  . magnesium hydroxide (MILK OF MAGNESIA) suspension 30 mL  30 mL Oral Daily PRN Clapacs, John T, MD      . metFORMIN (GLUCOPHAGE) tablet 500 mg  500 mg Oral Q breakfast Clapacs, John T, MD   500 mg at 09/24/20 0926  . nicotine (NICODERM  CQ - dosed in mg/24 hours) patch 14 mg  14 mg Transdermal Daily Antonieta Pert, MD   14 mg at 09/24/20 0925  . nystatin (MYCOSTATIN/NYSTOP) topical powder   Topical TID Laveda Abbe, NP      . risperiDONE (RISPERDAL) tablet 4 mg  4 mg Oral QHS Nahia Nissan E, MD      . rosuvastatin (CRESTOR) tablet 40 mg  40 mg Oral Daily Comer Locket, MD   40 mg at 09/24/20 0937  . traZODone (DESYREL) tablet 100 mg  100 mg  Oral QHS Bartholomew CrewsSingleton, Tishana Clinkenbeard E, MD   100 mg at 09/24/20 2056    Lab Results:  Results for orders placed or performed during the hospital encounter of 09/19/20 (from the past 48 hour(s))  Glucose, capillary     Status: None   Collection Time: 09/23/20 11:39 AM  Result Value Ref Range   Glucose-Capillary 87 70 - 99 mg/dL    Comment: Glucose reference range applies only to samples taken after fasting for at least 8 hours.  Glucose, capillary     Status: Abnormal   Collection Time: 09/23/20  4:58 PM  Result Value Ref Range   Glucose-Capillary 105 (H) 70 - 99 mg/dL    Comment: Glucose reference range applies only to samples taken after fasting for at least 8 hours.  Glucose, capillary     Status: Abnormal   Collection Time: 09/24/20  6:36 AM  Result Value Ref Range   Glucose-Capillary 104 (H) 70 - 99 mg/dL    Comment: Glucose reference range applies only to samples taken after fasting for at least 8 hours.  Glucose, capillary     Status: None   Collection Time: 09/24/20 11:41 AM  Result Value Ref Range   Glucose-Capillary 92 70 - 99 mg/dL    Comment: Glucose reference range applies only to samples taken after fasting for at least 8 hours.  Glucose, capillary     Status: Abnormal   Collection Time: 09/24/20  5:00 PM  Result Value Ref Range   Glucose-Capillary 121 (H) 70 - 99 mg/dL    Comment: Glucose reference range applies only to samples taken after fasting for at least 8 hours.  Glucose, capillary     Status: None   Collection Time: 09/25/20  7:27 AM  Result Value  Ref Range   Glucose-Capillary 81 70 - 99 mg/dL    Comment: Glucose reference range applies only to samples taken after fasting for at least 8 hours.    Blood Alcohol level:  Lab Results  Component Value Date   ETH <10 09/16/2020   ETH <10 12/03/2019    Metabolic Disorder Labs: Lab Results  Component Value Date   HGBA1C 6.5 (H) 09/16/2020   MPG 139.85 09/16/2020   MPG 128.37 12/28/2018   Lab Results  Component Value Date   PROLACTIN 15.1 07/03/2015   Lab Results  Component Value Date   CHOL 106 09/16/2020   TRIG 315 (H) 09/16/2020   HDL 28 (L) 09/16/2020   CHOLHDL 3.8 09/16/2020   VLDL 63 (H) 09/16/2020   LDLCALC 15 09/16/2020   LDLCALC 38 12/28/2018    Physical Findings: AIMS: Facial and Oral Movements Muscles of Facial Expression: None, normal Lips and Perioral Area: None, normal Jaw: None, normal Tongue: None, normal,Extremity Movements Upper (arms, wrists, hands, fingers): None, normal Lower (legs, knees, ankles, toes): None, normal, Trunk Movements Neck, shoulders, hips: None, normal, Overall Severity Severity of abnormal movements (highest score from questions above): None, normal Incapacitation due to abnormal movements: None, normal Patient's awareness of abnormal movements (rate only patient's report): No Awareness, Dental Status Current problems with teeth and/or dentures?: No Does patient usually wear dentures?: No      Musculoskeletal: Strength & Muscle Tone: within normal limits Gait & Station: untested Patient leans: N/A  Psychiatric Specialty Exam: Physical Exam Vitals reviewed.  HENT:     Head: Normocephalic.  Cardiovascular:     Rate and Rhythm: Normal rate and regular rhythm.  Pulmonary:     Effort: Pulmonary effort is normal.  Breath sounds: Normal breath sounds.  Musculoskeletal:     Comments: 4/5 UE and LE strength but has difficulty sitting up on side of bed unassisted, equal grip  Neurological:     General: No focal deficit  present.     Mental Status: She is alert.     Comments: CN 3-12 grossly intact; intact finger to nose     Review of Systems  Respiratory: Negative for shortness of breath.   Cardiovascular: Negative for chest pain.  Gastrointestinal: Negative for abdominal pain, diarrhea, nausea and vomiting.  Musculoskeletal:       Denies joint or extremity pain  Neurological: Negative for dizziness and headaches.  Psychiatric/Behavioral: Positive for confusion.    BP 120/92, HR 55, sats 99%  General Appearance: obese, casually dressed with poor hygiene in hospital gown  Eye Contact:  Fair  Speech:  Mumbling quality at times, regular rate  Volume:  Normal  Mood:  Aloof, ambivalent  Affect:  Constricted  Thought Process:  Superficially goal directed  Orientation:  Oriented to month, year, President, self  Thought Content:  Denies AVH, paranoia, ideas of reference, of first rank symptoms; no acute psychosis on exam  Suicidal Thoughts:  No  Homicidal Thoughts:  No  Memory: Poor  Judgement:  Fair  Insight:  Lacking  Psychomotor Activity:  Decreased   Concentration:  Concentration: Fair  Recall:  Poor  Fund of Knowledge:  Fair  Language:  Fair  Akathisia:  Negative  Assets:  Desire for Improvement Resilience  ADL's:  independent  Sleep:  Number of Hours: 5   Treatment Plan Summary: Diagnoses / Active Problems: Bipolar d/o MRE depressed Mild Cognitive impairment by hx Type II DM Hypothyroidism Hypertriglyceridemia Tobacco Use d/o  PLAN: 1. Safety and Monitoring:  -- Voluntary admission to inpatient psychiatric unit for safety, stabilization and treatment  -- Daily contact with patient to assess and evaluate symptoms and progress in treatment  -- Patient's case to be discussed in multi-disciplinary team meeting  -- Observation Level : q15 minute checks  -- Vital signs:  q12 hours  -- Precautions: suicide  2. Psychiatric Diagnoses and Treatment:   Bipolar d/o MRE depressed  Mild  cognitive impairment by hx  -- Patient on 1:1 for confusion and fall risk and sending now to the ED for w/u given her increased recent confusion and unwitnessed falls for medical w/u - I talked with the charge nurse at [facility] about pending transfer - had ammonia level, CBC, VPA level, and LFTs ordered for tonight but holding on labs since she is being transferred to ED and will see what labs she receives there  -- Have ordered KELS eval and APS referral pending since I do not feel patient is currently safe for discharge home alone  -- Continue walker for ambulation  -- SW looking into ALF referral -- Continue Cogentin 0.5mg  bid for side effects of medication - trying to reduce the amount of anticholinergic medication needed given her intermittent confusion -- Reduce Risperdal to 4mg  po qhs given increased confusion and monitor for mania/psychosis with dose reduction -- Continue Depakote 500mg  every afternoon for mood stability (AST 38, ALT 32, WBC 11.7, H/H 15.9/46.3, platelets 192)  -- Change Trazodone to 50mg  po qhs PRN for insomnia to avoid oversedation or fall risk -- Continue Wellbutrin XL 150mg  daily for depression and monitor for symptom improvement with recent dose increase  -- Metabolic profile and EKG monitoring obtained while on an atypical antipsychotic (BMI:44.02;  Lipid Panel: cholesterol 106,  triglycerides 315, HDL 28, LDL 15; HbgA1c: 6.5; QTc:44ms)   -- Encouraged patient to participate in unit milieu and in scheduled group therapies   -- Short Term Goals: Ability to identify changes in lifestyle to reduce recurrence of condition will improve and Ability to identify and develop effective coping behaviors will improve  -- Long Term Goals: Improvement in symptoms so as ready for discharge   3. Medical Issues Being Addressed:   Hypertriglyceridemia  -- Continue Fenofibrate 160mg  daily  -- Continue Crestor 40mg  daily    Type II DM  -- Reduce Glipizide to 2.5mg  po daily given recent  blood sugar trends  -- Continue Glucophage 500mg  daily  -- Checking FSBS tid (recent blood sugars 81, 121, 92, 104)   Hypothyroidism  -- Continue Synthroid daily  -- TSH 1.116   Seasonal Allergies  -- Continue Claritin 10mg  daily   Tobacco Use d/o  -- Smoking cessation encouraged  -- Continue Nicoderm patch 14mg /24 hours   Age Indeterminate septal infarct on EKG  -- Patient will remain on ASA 81mg  daily  -- Patient currently asymptomatic - spoke with Dr. , on call for cardiology, who feels was likely lead placement and he recommends f/u with PCP as an outpatient - no further inpatient w/u needed at this time  -- Holding restart of Cozaar since patient has not been compliant with med prior to admission and monitoring blood pressures   High Risk Medication Use: -- The complexity of this patient's case involves drug therapy with Depakote which requires intensive monitoring for toxicity. This is in part due to a narrow therapeutic window as well as potential for toxicity with this medication.   4. Discharge Planning:   -- Social work and case management to assist with discharge planning and identification of hospital follow-up needs prior to discharge - APS referral pending and asked patient to consider ALF referral  -- Estimated LOS: TBD  -- Discharge Concerns: Need to establish a safety plan; Medication compliance and effectiveness  -- Discharge Goals: Return home with outpatient referrals for mental health follow-up including medication management/psychotherapy  , MD, FAPA 09/25/2020, 9:02 AM

## 2020-09-25 NOTE — Progress Notes (Signed)
1:1 Note Pt woke up to go to the bathroom but appeared very confused, instead of seating on the commode, pt sat down on the floor and could not get up. It took 4 staff members to get the pt off the floor to the commode and back to bed bed. Pt is mumbling words and hard to understand. Pt placed on 1:1 observation for safety. Pt safe at this time, will continue to monitor.

## 2020-09-25 NOTE — ED Provider Notes (Signed)
Emergency Medicine Provider Triage Evaluation Note  Julie Morrow , a 70 y.o. female  was evaluated in triage.  Patient was sent from behavioral health by provider due to multiple unwitnessed falls and possible hitting her head.  They were concerned that patient was confused that she was walking backwards using her walker.  Pt complains of right leg pain.  Patient is alert and oriented x3 however is a poor historian and unable to give any details on falls.  Review of Systems  Positive: Right leg pain Negative: Neck or back pain  Physical Exam  BP (!) 110/54 (BP Location: Left Arm)   Pulse 61   Temp 98.2 F (36.8 C) (Oral)   Resp 16   Ht 5\' 1"  (1.549 m)   Wt 105.7 kg   SpO2 94%   BMI 44.02 kg/m  Gen:   Awake, no distress   Resp:  Normal effort  MSK:   Moves extremities without difficulty, patient has diffuse tenderness throughout her entire right leg however is able to lift and straighten her leg without difficulty Other:  Cervical spinous tenderness  Medical Decision Making  Medically screening exam initiated at 12:58 PM.  Appropriate orders placed.  was informed that the remainder of the evaluation will be completed by another provider, this initial triage assessment does not replace that evaluation, and the importance of remaining in the ED until their evaluation is complete.  The patient appears stable so that the remainder of the work up may be completed by another provider.      [name], PA-C 09/25/20 1303    11/25/20, MD 09/25/20 1355

## 2020-09-25 NOTE — ED Notes (Signed)
Per MMD-states patient has had 2 falls at [facility] not sure if she hit her head-states patient is not "acting" her norm-wants an ammonia and CT of head done

## 2020-09-25 NOTE — [facility] Suicide Risk Assessment (Signed)
[facility] INPATIENT:  Family/Significant Other Suicide Prevention Education  Suicide Prevention Education:  Education Completed; Nehemiah Settle [phone] (Friend) has been identified by the patient as the family member/significant other with whom the patient will be residing, and identified as the person(s) who will aid the patient in the event of a mental health crisis (suicidal ideations/suicide attempt).  With written consent from the patient, the family member/significant other has been provided the following suicide prevention education, prior to the and/or following the discharge of the patient.  The suicide prevention education provided includes the following:  Suicide risk factors  Suicide prevention and interventions  National Suicide Hotline telephone number  [facility] assessment telephone number  [facility] Emergency Assistance 911  [facility] and/or Residential Mobile Crisis Unit telephone number  Request made of family/significant other to:  Remove weapons (e.g., guns, rifles, knives), all items previously/currently identified as safety concern.    Remove drugs/medications (over-the-counter, prescriptions, illicit drugs), all items previously/currently identified as a safety concern.  The family member/significant other verbalizes understanding of the suicide prevention education information provided.  The family member/significant other agrees to remove the items of safety concern listed above.  CSW spoke with Mrs. Clapp who states that she is a family friend and is not Ms. Cutting's legal payee or power of attorney.  Mrs. Coralee North states that she only helps Ms. Knudsen pay her light bill, phone bill, ect. And does not wish to be a legal guardian or responsible for Ms. Mikita's care in any way.  Mrs. Coralee North states that she was Ms. Grajeda's daughter's friend since childhood and has seen Ms. Dorsainvil experience Depression and Bipolar symptoms for many years.  Mrs.  Coralee North also states that she has witnessed Ms. Eblen attempt suicide in the past as well.  Mrs. Clapp provided Ms. Gilreath's primary care doctor's name and number Dr. Welton Flakes at [phone] and states that he is located in [place].  Mrs. Coralee North states that Dr. Welton Flakes made some medication changes in mid-April of 2022 and Ms. Buczek's mental health began to decline after that.  Mrs. Coralee North states that Ms. Shull also received a ticket for driving while license revoked because her license was taken away due to medical reasons.  Mrs. Clapps states that Ms. Pyper missed the court date for this ticket because she was admitted to the hospital at [facility].  Mrs. Coralee North states that Ms. Willmann gave her the debit card to pay the ticket for her.  Mrs. Coralee North states that there are no weapons or firearms at Ms. Piazza's home.  CSW completed SPE with Mrs. Clapp.   Aram Beecham 09/25/2020, 12:32 PM

## 2020-09-25 NOTE — Discharge Instructions (Signed)
You came to the emergency department today to be evaluated after suffering multiple falls.  The scans of your head and neck were unremarkable.  The x-rays of your right hip, and right knee showed no broken bones or dislocations.  Your lab work was reassuring.  You are cleared to go back to the behavioral health unit at this time.

## 2020-09-25 NOTE — ED Notes (Signed)
Attempted to all transport, LM

## 2020-09-25 NOTE — Progress Notes (Signed)
Pt in 405-1 came to writer and stated that there was someone in their room and that they were trying to sit on their bed. Writer went down to the room and discovered pt attempting to enter the other pt's bathroom. Writer called out to pt and informed them that their room was next door. Writer reminded pt that their name is on the door and to reference the door tag to make sure that they were in the right room. Pt appeared to be confused so Clinical research associate assisted pt to their room and apologized to the other pt. Pt made note of the name on the door and proceeded to lie in their bed. Writer asked pt if they were okay and they replied yes. Writer reported incident to Charity fundraiser.

## 2020-09-26 DIAGNOSIS — F319 Bipolar disorder, unspecified: Secondary | ICD-10-CM | POA: Diagnosis not present

## 2020-09-26 LAB — GLUCOSE, CAPILLARY
Glucose-Capillary: 101 mg/dL — ABNORMAL HIGH (ref 70–99)
Glucose-Capillary: 110 mg/dL — ABNORMAL HIGH (ref 70–99)

## 2020-09-26 MED ORDER — TRAZODONE HCL 100 MG PO TABS
100.0000 mg | ORAL_TABLET | Freq: Every day | ORAL | Status: DC
  Administered 2020-09-26 – 2020-09-27 (×2): 100 mg via ORAL
  Filled 2020-09-26 (×4): qty 1

## 2020-09-26 NOTE — Progress Notes (Signed)
Pt resting in bed with eyes closed.  Pt taking medications without incident and in no acute distress. 1:1 sitter at bedside.

## 2020-09-26 NOTE — Tx Team (Addendum)
Interdisciplinary Treatment and Diagnostic Plan Update  09/26/2020 Time of Session: 10:05am  Julie Morrow MRN: [id number]  Principal Diagnosis: Bipolar depression [facility])  Secondary Diagnoses: Principal Problem:   Bipolar depression (HCC) Active Problems:   HLD (hyperlipidemia)   Hypothyroidism   Diabetes (HCC)   Current Medications:  Current Facility-Administered Medications  Medication Dose Route Frequency Provider Last Rate Last Admin  . acetaminophen (TYLENOL) tablet 650 mg  650 mg Oral Q6H PRN Clapacs, Jackquline Denmark, MD   650 mg at 09/24/20 1722  . alum & mag hydroxide-simeth (MAALOX/MYLANTA) 200-200-20 MG/5ML suspension 30 mL  30 mL Oral Q4H PRN Clapacs, John T, MD      . aspirin EC tablet 81 mg  81 mg Oral Daily Clapacs, Jackquline Denmark, MD   81 mg at 09/25/20 0938  . benztropine (COGENTIN) tablet 0.5 mg  0.5 mg Oral BID Bartholomew Crews E, MD   0.5 mg at 09/25/20 0939  . buPROPion (WELLBUTRIN XL) 24 hr tablet 150 mg  150 mg Oral Daily Bartholomew Crews E, MD   150 mg at 09/25/20 0939  . diphenoxylate-atropine (LOMOTIL) 2.5-0.025 MG per tablet 2 tablet  2 tablet Oral QID PRN Antonieta Pert, MD      . divalproex (DEPAKOTE) DR tablet 500 mg  500 mg Oral QPM Antonieta Pert, MD   500 mg at 09/24/20 1731  . fenofibrate tablet 160 mg  160 mg Oral Daily Clapacs, Jackquline Denmark, MD   160 mg at 09/25/20 0939  . levothyroxine (SYNTHROID) tablet 175 mcg  175 mcg Oral Q0600 Comer Locket, MD   175 mcg at 09/26/20 0747  . loratadine (CLARITIN) tablet 10 mg  10 mg Oral Daily Clapacs, Jackquline Denmark, MD   10 mg at 09/25/20 0943  . magnesium hydroxide (MILK OF MAGNESIA) suspension 30 mL  30 mL Oral Daily PRN Clapacs, John T, MD      . metFORMIN (GLUCOPHAGE) tablet 500 mg  500 mg Oral Q breakfast Clapacs, Jackquline Denmark, MD   500 mg at 09/25/20 0944  . nicotine (NICODERM CQ - dosed in mg/24 hours) patch 14 mg  14 mg Transdermal Daily Antonieta Pert, MD   14 mg at 09/25/20 0942  . nystatin (MYCOSTATIN/NYSTOP) topical powder    Topical TID Laveda Abbe, NP   1 Units at 09/25/20 0940  . risperiDONE (RISPERDAL) tablet 4 mg  4 mg Oral QHS Comer Locket, MD   4 mg at 09/25/20 2058  . rosuvastatin (CRESTOR) tablet 40 mg  40 mg Oral Daily Comer Locket, MD   40 mg at 09/25/20 0955  . traZODone (DESYREL) tablet 100 mg  100 mg Oral QHS Mason Jim, Amy E, MD       PTA Medications: Medications Prior to Admission  Medication Sig Dispense Refill Last Dose  . aspirin 81 MG chewable tablet Chew 1 tablet (81 mg total) by mouth daily. (Patient not taking: No sig reported) 30 tablet 1   . CINNAMON PO Take 1 tablet by mouth daily.     . divalproex (DEPAKOTE ER) 250 MG 24 hr tablet Take 750 mg by mouth at bedtime.     . fenofibrate 160 MG tablet Take 1 tablet (160 mg total) by mouth daily with breakfast. (Patient not taking: No sig reported) 30 tablet 1   . FEROSUL 325 (65 Fe) MG tablet Take 325 mg by mouth daily.     . hydrOXYzine (ATARAX/VISTARIL) 10 MG tablet Take 10 mg by mouth 3 (three)  times daily as needed. Take 10 mg by mouth three times daily as needed for anxiety and 30 mg at bedtime as needed for sleep     . levothyroxine (SYNTHROID) 175 MCG tablet Take 175 mcg by mouth daily.     Marland Kitchen levothyroxine (SYNTHROID) 200 MCG tablet Take 1 tablet (200 mcg total) by mouth daily before breakfast. (Patient not taking: No sig reported) 30 tablet 1   . loratadine (CLARITIN) 10 MG tablet Take 1 tablet (10 mg total) by mouth daily. (Patient not taking: No sig reported) 30 tablet 1   . losartan (COZAAR) 25 MG tablet Take 25 mg by mouth daily.     . melatonin 5 MG TABS Take 5-10 mg by mouth at bedtime.     . metFORMIN (GLUCOPHAGE) 500 MG tablet Take 1 tablet (500 mg total) by mouth daily with breakfast. (Patient not taking: No sig reported) 30 tablet 1   . Multiple Vitamins-Minerals (CENTRUM) tablet Take 1 tablet by mouth daily.     . polyvinyl alcohol (LIQUIFILM TEARS) 1.4 % ophthalmic solution Place 1 drop into both eyes as  needed for dry eyes.     . Probiotic Product (PROBIOTIC PO) Take 1 capsule by mouth daily.     . risperiDONE (RISPERDAL) 3 MG tablet Take 2 tablets (6 mg total) by mouth at bedtime. (Patient not taking: No sig reported) 60 tablet 1   . rosuvastatin (CRESTOR) 40 MG tablet Take 40 mg by mouth daily.     . RYBELSUS 14 MG TABS Take 1 tablet by mouth every morning.     . traZODone (DESYREL) 50 MG tablet Take 50 mg by mouth at bedtime as needed for sleep.       Patient Stressors: Health problems Occupational concerns Other: living by myself and feeling home bound  Patient Strengths: Ability for insight Capable of independent living Licensed conveyancer Supportive family/friends  Treatment Modalities: Medication Management, Group therapy, Case management,  1 to 1 session with clinician, Psychoeducation, Recreational therapy.   Physician Treatment Plan for Primary Diagnosis: Bipolar depression (HCC) Long Term Goal(s): Improvement in symptoms so as ready for discharge   Short Term Goals: Ability to identify changes in lifestyle to reduce recurrence of condition will improve Ability to identify and develop effective coping behaviors will improve  Medication Management: Evaluate patient's response, side effects, and tolerance of medication regimen.  Therapeutic Interventions: 1 to 1 sessions, Unit Group sessions and Medication administration.  Evaluation of Outcomes: Progressing  Physician Treatment Plan for Secondary Diagnosis: Principal Problem:   Bipolar depression (HCC) Active Problems:   HLD (hyperlipidemia)   Hypothyroidism   Diabetes (HCC)  Long Term Goal(s): Improvement in symptoms so as ready for discharge   Short Term Goals: Ability to identify changes in lifestyle to reduce recurrence of condition will improve Ability to identify and develop effective coping behaviors will improve     Medication Management: Evaluate patient's response, side effects, and  tolerance of medication regimen.  Therapeutic Interventions: 1 to 1 sessions, Unit Group sessions and Medication administration.  Evaluation of Outcomes: Progressing   RN Treatment Plan for Primary Diagnosis: Bipolar depression (HCC) Long Term Goal(s): Knowledge of disease and therapeutic regimen to maintain health will improve  Short Term Goals: Ability to remain free from injury will improve, Ability to participate in decision making will improve, Ability to verbalize feelings will improve, Ability to disclose and discuss suicidal ideas and Ability to identify and develop effective coping behaviors will improve  Medication Management:  RN will administer medications as ordered by provider, will assess and evaluate patient's response and provide education to patient for prescribed medication. RN will report any adverse and/or side effects to prescribing provider.  Therapeutic Interventions: 1 on 1 counseling sessions, Psychoeducation, Medication administration, Evaluate responses to treatment, Monitor vital signs and CBGs as ordered, Perform/monitor CIWA, COWS, AIMS and Fall Risk screenings as ordered, Perform wound care treatments as ordered.  Evaluation of Outcomes: Progressing   LCSW Treatment Plan for Primary Diagnosis: Bipolar depression (HCC) Long Term Goal(s): Safe transition to appropriate next level of care at discharge, Engage patient in therapeutic group addressing interpersonal concerns.  Short Term Goals: Engage patient in aftercare planning with referrals and resources, Increase social support, Increase emotional regulation, Facilitate acceptance of mental health diagnosis and concerns, Identify triggers associated with mental health/substance abuse issues and Increase skills for wellness and recovery  Therapeutic Interventions: Assess for all discharge needs, 1 to 1 time with Social worker, Explore available resources and support systems, Assess for adequacy in community  support network, Educate family and significant other(s) on suicide prevention, Complete Psychosocial Assessment, Interpersonal group therapy.  Evaluation of Outcomes: Progressing   Progress in Treatment: Attending groups: Yes. Participating in groups: Yes. Taking medication as prescribed: Yes. Toleration medication: Yes. Family/Significant other contact made: Yes, individual(s) contacted:  Sister Patient understands diagnosis: No. Discussing patient identified problems/goals with staff: Yes. Medical problems stabilized or resolved: Yes. Denies suicidal/homicidal ideation: Yes. Issues/concerns per patient self-inventory: No.   New problem(s) identified: No, Describe:  None  New Short Term/Long Term Goal(s): medication stabilization, elimination of SI thoughts, development of comprehensive mental wellness plan.   Patient Goals: "To move forward and learn some new skills"  Discharge Plan or Barriers: Patient recently admitted. CSW will continue to follow and assess for appropriate referrals and possible discharge planning.   Reason for Continuation of Hospitalization: Depression Medical Issues Medication stabilization Suicidal ideation  Estimated Length of Stay: 3 to 5 days   Attendees: Patient: Julie Morrow  10/01/2020   Physician: Landry Mellow, MD  10/01/2020   Nursing:  10/01/2020   RN Care Manager: 10/01/2020   Social Worker: Melba Coon, LCSWA  10/01/2020   Recreational Therapist:  10/01/2020   Other:  10/01/2020   Other:  10/01/2020   Other: 10/01/2020     Scribe for Treatment Team: Felizardo Hoffmann, LCSWA 09/26/2020 10:04 AM

## 2020-09-26 NOTE — Progress Notes (Signed)
Psychoeducational Group Note  Date:  09/26/2020 Time:  0338  Group Topic/Focus:  Wrap-Up Group:   The focus of this group is to help patients review their daily goal of treatment and discuss progress on daily workbooks.  Participation Level: Did Not Attend  Participation Quality:  Not Applicable  Affect:  Not Applicable  Cognitive:  Not Applicable  Insight:  Not Applicable  Engagement in Group: Not Applicable  Additional Comments:  The patient did not attend group last evening.   Hazle Coca S 09/26/2020, 3:39 AM

## 2020-09-26 NOTE — Progress Notes (Signed)
Patient yelling she need a fork and a spoon. Patient trying to get out bed. Staff ask patient what is wrong patient continue to yell she needs a fork and spoon.Staff ask patient to lower her voice she is too loud  Other patient are trying to sleep.   RN came in the room to ask patient to lower her voice she is too loud other patients are trying to sleep.

## 2020-09-26 NOTE — Progress Notes (Addendum)
[facility] MD Progress Note  09/26/2020 7:08 AM Julie Morrow  MRN:  [id number]   Chief Complaint: depression and SI  Subjective:  Julie Morrow is a 70 y.o. female with a past psychiatric history significant for bipolar d/o and anxiety, who was initially admitted for inpatient psychiatric hospitalization on 09/19/2020 for management of worsening depression and SI. The patient is currently on [facility] Day 7.   Chart Review from last 24 hours:  The patient's chart was reviewed and nursing notes were reviewed. The patient's case was discussed in multidisciplinary team meeting. Per nursing, she returned from the ED late yesterday after being evaluated for confusion and recent falls. Overnight she was yelling out and required redirection by 1:1 for safety. She did not attend groups. Per [facility] she was compliant with scheduled medications and did not require PRNs.   Information Obtained Today During Patient Interview: The patient was seen and evaluated on the unit with 1:1 present. She states she is "having good thoughts" today and states her mood "is about the same." When questioned about events last night, she has no recall of being agitated or yelling. She denies SI, HI, AVH, paranoia, ideas of reference, or first rank symptoms. She denies medication side-effects and voices no physical complaints other than chronic right leg pain that is nonspecific. She states she moved her bowels yesterday and denies any dysuria/urgency or frequency. She states her appetite is good and her sleep has been fair. She is oriented to month, year and President but not date or day of the week. She states she showered "a few days ago" and was encouraged to attend to ADLs with help by staff.    Principal Problem: Bipolar depression (HCC) Diagnosis: Principal Problem:   Bipolar depression (HCC) Active Problems:   HLD (hyperlipidemia)   Hypothyroidism   Diabetes (HCC)  Total Time Spent in Direct Patient Care:  I personally spent  30 minutes on the unit in direct patient care. The direct patient care time included face-to-face time with the patient, reviewing the patient's chart, communicating with other professionals, and coordinating care. Greater than 50% of this time was spent in counseling or coordinating care with the patient regarding goals of hospitalization, psycho-education, and discharge planning needs.  Past Psychiatric History: see admission H&P  Past Medical History:  Past Medical History:  Diagnosis Date  . Anxiety   . Bipolar 1 disorder (HCC)   . Bradycardia   . Depression   . Diabetes mellitus, type II (HCC)    Patient takes Glucotrol and Januvia  . Hypothyroidism   . Lithium toxicity     Past Surgical History:  Procedure Laterality Date  . ABDOMINAL HYSTERECTOMY    . CHOLECYSTECTOMY     Family History:  Family History  Problem Relation Age of Onset  . Dementia Mother   . Arthritis Father    Family Psychiatric  History: see admission H&P  Social History:  Social History   Substance and Sexual Activity  Alcohol Use No  . Alcohol/week: 0.0 standard drinks     Social History   Substance and Sexual Activity  Drug Use No    Social History   Socioeconomic History  . Marital status: Single    Spouse name: Not on file  . Number of children: Not on file  . Years of education: Not on file  . Highest education level: Not on file  Occupational History  . Not on file  Tobacco Use  . Smoking status: Current Every Day  Smoker    Packs/day: 0.50    Years: 20.00    Pack years: 10.00    Types: Cigarettes    Start date: 11/06/1984  . Smokeless tobacco: Never Used  Vaping Use  . Vaping Use: Never used  Substance and Sexual Activity  . Alcohol use: No    Alcohol/week: 0.0 standard drinks  . Drug use: No  . Sexual activity: Not Currently  Other Topics Concern  . Not on file  Social History Narrative  . Not on file   Social Determinants of Health   Financial Resource Strain: Not  on file  Food Insecurity: Not on file  Transportation Needs: Not on file  Physical Activity: Not on file  Stress: Not on file  Social Connections: Not on file   Sleep: Fair  Appetite:  Fair  Current Medications: Current Facility-Administered Medications  Medication Dose Route Frequency Provider Last Rate Last Admin  . acetaminophen (TYLENOL) tablet 650 mg  650 mg Oral Q6H PRN Clapacs, Jackquline Denmark, MD   650 mg at 09/24/20 1722  . alum & mag hydroxide-simeth (MAALOX/MYLANTA) 200-200-20 MG/5ML suspension 30 mL  30 mL Oral Q4H PRN Clapacs, John T, MD      . aspirin EC tablet 81 mg  81 mg Oral Daily Clapacs, Jackquline Denmark, MD   81 mg at 09/25/20 0938  . benztropine (COGENTIN) tablet 0.5 mg  0.5 mg Oral BID Bartholomew Crews E, MD   0.5 mg at 09/25/20 0939  . buPROPion (WELLBUTRIN XL) 24 hr tablet 150 mg  150 mg Oral Daily Bartholomew Crews E, MD   150 mg at 09/25/20 0939  . diphenoxylate-atropine (LOMOTIL) 2.5-0.025 MG per tablet 2 tablet  2 tablet Oral QID PRN Antonieta Pert, MD      . divalproex (DEPAKOTE) DR tablet 500 mg  500 mg Oral QPM Antonieta Pert, MD   500 mg at 09/24/20 1731  . fenofibrate tablet 160 mg  160 mg Oral Daily Clapacs, Jackquline Denmark, MD   160 mg at 09/25/20 0939  . levothyroxine (SYNTHROID) tablet 175 mcg  175 mcg Oral Q0600 Comer Locket, MD   175 mcg at 09/25/20 0956  . loratadine (CLARITIN) tablet 10 mg  10 mg Oral Daily Clapacs, Jackquline Denmark, MD   10 mg at 09/25/20 0943  . magnesium hydroxide (MILK OF MAGNESIA) suspension 30 mL  30 mL Oral Daily PRN Clapacs, John T, MD      . metFORMIN (GLUCOPHAGE) tablet 500 mg  500 mg Oral Q breakfast Clapacs, Jackquline Denmark, MD   500 mg at 09/25/20 0944  . nicotine (NICODERM CQ - dosed in mg/24 hours) patch 14 mg  14 mg Transdermal Daily Antonieta Pert, MD   14 mg at 09/25/20 0942  . nystatin (MYCOSTATIN/NYSTOP) topical powder   Topical TID Laveda Abbe, NP   1 Units at 09/25/20 0940  . risperiDONE (RISPERDAL) tablet 4 mg  4 mg Oral QHS  Comer Locket, MD   4 mg at 09/25/20 2058  . rosuvastatin (CRESTOR) tablet 40 mg  40 mg Oral Daily Comer Locket, MD   40 mg at 09/25/20 0955  . traZODone (DESYREL) tablet 100 mg  100 mg Oral QHS PRN Comer Locket, MD        Lab Results:  Results for orders placed or performed during the [facility] encounter of 09/19/20 (from the past 48 hour(s))  Glucose, capillary     Status: None   Collection Time: 09/24/20 11:41 AM  Result Value Ref Range   Glucose-Capillary 92 70 - 99 mg/dL    Comment: Glucose reference range applies only to samples taken after fasting for at least 8 hours.  Glucose, capillary     Status: Abnormal   Collection Time: 09/24/20  5:00 PM  Result Value Ref Range   Glucose-Capillary 121 (H) 70 - 99 mg/dL    Comment: Glucose reference range applies only to samples taken after fasting for at least 8 hours.  Glucose, capillary     Status: None   Collection Time: 09/25/20  7:27 AM  Result Value Ref Range   Glucose-Capillary 81 70 - 99 mg/dL    Comment: Glucose reference range applies only to samples taken after fasting for at least 8 hours.  CBC with Differential     Status: Abnormal   Collection Time: 09/25/20  2:20 PM  Result Value Ref Range   WBC 7.8 4.0 - 10.5 K/uL   RBC 4.89 3.87 - 5.11 MIL/uL   Hemoglobin 15.2 (H) 12.0 - 15.0 g/dL   HCT 19.1 (H) 47.8 - 29.5 %   MCV 96.9 80.0 - 100.0 fL   MCH 31.1 26.0 - 34.0 pg   MCHC 32.1 30.0 - 36.0 g/dL   RDW 62.1 30.8 - 65.7 %   Platelets 160 150 - 400 K/uL   nRBC 0.0 0.0 - 0.2 %   Neutrophils Relative % 58 %   Neutro Abs 4.5 1.7 - 7.7 K/uL   Lymphocytes Relative 32 %   Lymphs Abs 2.5 0.7 - 4.0 K/uL   Monocytes Relative 8 %   Monocytes Absolute 0.6 0.1 - 1.0 K/uL   Eosinophils Relative 1 %   Eosinophils Absolute 0.1 0.0 - 0.5 K/uL   Basophils Relative 1 %   Basophils Absolute 0.0 0.0 - 0.1 K/uL   Immature Granulocytes 0 %   Abs Immature Granulocytes 0.02 0.00 - 0.07 K/uL    Comment: Performed at [facility], [address] [place], [place] [address]  Comprehensive metabolic panel     Status: Abnormal   Collection Time: 09/25/20  2:20 PM  Result Value Ref Range   Sodium 140 135 - 145 mmol/L   Potassium 4.4 3.5 - 5.1 mmol/L   Chloride 105 98 - 111 mmol/L   CO2 24 22 - 32 mmol/L   Glucose, Bld 100 (H) 70 - 99 mg/dL    Comment: Glucose reference range applies only to samples taken after fasting for at least 8 hours.   BUN 17 8 - 23 mg/dL   Creatinine, Ser 2.95 0.44 - 1.00 mg/dL   Calcium 9.8 8.9 - 28.4 mg/dL   Total Protein 7.3 6.5 - 8.1 g/dL   Albumin 4.2 3.5 - 5.0 g/dL   AST 19 15 - 41 U/L   ALT 21 0 - 44 U/L   Alkaline Phosphatase 53 38 - 126 U/L   Total Bilirubin 0.3 0.3 - 1.2 mg/dL   GFR, Estimated >13 >24 mL/min    Comment: (NOTE) Calculated using the CKD-EPI Creatinine Equation (2021)    Anion gap 11 5 - 15    Comment: Performed at [facility], [address], [place], [place] [address]  Ammonia     Status: None   Collection Time: 09/25/20  3:58 PM  Result Value Ref Range   Ammonia 19 9 - 35 umol/L    Comment: Performed at [facility], [address], [place], [place] [address]  Glucose, capillary  Status: Abnormal   Collection Time: 09/25/20  8:52 PM  Result Value Ref Range   Glucose-Capillary 111 (H) 70 - 99 mg/dL    Comment: Glucose reference range applies only to samples taken after fasting for at least 8 hours.   Comment 1 Notify RN    Comment 2 Document in Chart   Glucose, capillary     Status: Abnormal   Collection Time: 09/26/20  5:52 AM  Result Value Ref Range   Glucose-Capillary 101 (H) 70 - 99 mg/dL    Comment: Glucose reference range applies only to samples taken after fasting for at least 8 hours.   Comment 1 Notify RN    Comment 2 Document in Chart     Blood Alcohol level:  Lab Results  Component Value Date   ETH <10 09/16/2020   ETH <10 12/03/2019    Metabolic Disorder Labs: Lab  Results  Component Value Date   HGBA1C 6.5 (H) 09/16/2020   MPG 139.85 09/16/2020   MPG 128.37 12/28/2018   Lab Results  Component Value Date   PROLACTIN 15.1 07/03/2015   Lab Results  Component Value Date   CHOL 106 09/16/2020   TRIG 315 (H) 09/16/2020   HDL 28 (L) 09/16/2020   CHOLHDL 3.8 09/16/2020   VLDL 63 (H) 09/16/2020   LDLCALC 15 09/16/2020   LDLCALC 38 12/28/2018    Physical Findings: AIMS: Facial and Oral Movements Muscles of Facial Expression: None, normal Lips and Perioral Area: None, normal Jaw: None, normal Tongue: None, normal,Extremity Movements Upper (arms, wrists, hands, fingers): None, normal Lower (legs, knees, ankles, toes): None, normal, Trunk Movements Neck, shoulders, hips: None, normal, Overall Severity Severity of abnormal movements (highest score from questions above): None, normal Incapacitation due to abnormal movements: None, normal Patient's awareness of abnormal movements (rate only patient's report): No Awareness, Dental Status Current problems with teeth and/or dentures?: No Does patient usually wear dentures?: No      Musculoskeletal: Strength & Muscle Tone: within normal limits Gait & Station: untested Patient leans: N/A  Psychiatric Specialty Exam: Physical Exam Vitals reviewed.  HENT:     Head: Normocephalic.  Pulmonary:     Effort: Pulmonary effort is normal.  Neurological:     General: No focal deficit present.     Mental Status: She is alert.     Review of Systems  Respiratory: Negative for shortness of breath.   Cardiovascular: Negative for chest pain.  Gastrointestinal: Negative for abdominal pain, constipation, diarrhea, nausea and vomiting.  Musculoskeletal:       Nonspecific right leg pain  Neurological: Negative for dizziness and headaches.  Psychiatric/Behavioral: Positive for confusion.    BP 115/56, HR 62, sats 98%  General Appearance: obese, casually dressed with poor hygiene  Eye Contact:  Fair   Speech:  Mumbling quality at times, regular rate  Volume:  Normal  Mood:  Aloof, ambivalent  Affect:  Constricted  Thought Process:  concrete  Orientation:  Oriented to month, year, President, self  Thought Content:  Denies AVH, paranoia, ideas of reference, of first rank symptoms; no acute psychosis on exam  Suicidal Thoughts:  No  Homicidal Thoughts:  No  Memory: Poor  Judgement:  Limited  Insight:  Lacking  Psychomotor Activity:  Decreased   Concentration:  Concentration: Fair  Recall:  Poor  Fund of Knowledge:  Fair  Language:  Fair  Akathisia:  Negative  Assets:  Desire for Improvement Resilience  ADL's:  independent  Sleep:  Number of Hours: 5  Treatment Plan Summary: Diagnoses / Active Problems: Bipolar d/o MRE depressed Mild Cognitive impairment by hx Type II DM Hypothyroidism Hypertriglyceridemia Tobacco Use d/o  PLAN: 1. Safety and Monitoring:  -- Voluntary admission to inpatient psychiatric unit for safety, stabilization and treatment  -- Daily contact with patient to assess and evaluate symptoms and progress in treatment  -- Patient's case to be discussed in multi-disciplinary team meeting  -- Observation Level : q15 minute checks  -- Vital signs:  q12 hours  -- Precautions: suicide  2. Psychiatric Diagnoses and Treatment:   Bipolar d/o MRE depressed  Mild cognitive impairment by hx  -- Patient on 1:1 for confusion and fall risk   -- Have ordered KELS eval and APS referral pending since I do not feel patient is currently safe for discharge home alone; will attempt MOCA when she will cooperate for testing  -- While in the ED on 09/25/20 patient had Head CT noncontrast and CT cervical spine which were negative and Ammonia level 19, CMP WNL except for glucose 100, and CBC with WBC 7.8, H/H 15.2/47.4, platelets 160)  -- Continue walker for ambulation  -- SW looking into ALF referral -- Continue Cogentin 0.5mg  bid for side effects of medication -- Monitor  with dose reduction in Risperdal to 4mg  po qhs - if agitation persists may need to increase dose again -- Checking clean catch UA given recent confusion to r/o UTI -- Continue VPA 500mg  every afternoon for mood stability and nightly agitation (AST 19, ALT 21, WBC 7.8, H/H 15.2/47.4, platelets 160) VPA level pending  -- Restart scheduled Trazodone 100mg  po qhs PRN for insomnia while on 1:1 monitoring   -- Continue Wellbutrin XL 150mg  daily for depression   -- Metabolic profile and EKG monitoring obtained while on an atypical antipsychotic (BMI:44.02;  Lipid Panel: cholesterol 106, triglycerides 315, HDL 28, LDL 15; HbgA1c: 6.5; QTc:48ms)   -- Encouraged patient to participate in unit milieu and in scheduled group therapies   -- Short Term Goals: Ability to identify changes in lifestyle to reduce recurrence of condition will improve and Ability to identify and develop effective coping behaviors will improve  -- Long Term Goals: Improvement in symptoms so as ready for discharge   3. Medical Issues Being Addressed:   Hypertriglyceridemia  -- Continue Fenofibrate 160mg  daily  -- Continue Crestor 40mg  daily    Type II DM  -- Discontinue Glipizide to 2.5mg  given recent blood sugar trends  -- Continue Glucophage 500mg  daily  -- Checking FSBS tid (recent blood sugars 101, 111, 81)   Hypothyroidism  -- Continue Synthroid 07-22-2000 daily  -- TSH 1.116   Seasonal Allergies  -- Continue Claritin 10mg  daily   Tobacco Use d/o  -- Smoking cessation encouraged  -- Continue Nicoderm patch 14mg /24 hours   Age Indeterminate septal infarct on EKG  -- Patient will remain on ASA 81mg  daily  -- Patient currently asymptomatic - spoke with Dr. , on call for cardiology, who feels was likely lead placement and he recommends f/u with PCP as an outpatient - no further inpatient w/u needed at this time  -- Holding restart of Cozaar since patient has not been compliant with med prior to admission and  monitoring blood pressures   High Risk Medication Use: -- The complexity of this patient's case involves drug therapy with Depakote which requires intensive monitoring for toxicity. This is in part due to a narrow therapeutic window as well as potential for toxicity with this medication.   4.  Discharge Planning:   -- Social work and case management to assist with discharge planning and identification of [facility] follow-up needs prior to discharge - APS referral pending and asked patient to consider ALF referral  -- Estimated LOS: TBD  -- Discharge Concerns: Need to establish a safety plan; Medication compliance and effectiveness  -- Discharge Goals: Return home with outpatient referrals for mental health follow-up including medication management/psychotherapy  Comer LocketAmy E Macedonio Scallon, MD, FAPA 09/26/2020, 7:08 AM

## 2020-09-26 NOTE — Progress Notes (Signed)
Patient yelling. Staff remind patient this a hospital. She needs to be quiet there are patients trying to sleep.

## 2020-09-26 NOTE — Progress Notes (Signed)
Pt sitting in dayroom with 1:1 sitter in place.  Pt is in no acute distress and interacting with peers.

## 2020-09-26 NOTE — NC FL2 (Signed)
[place] MEDICAID FL2 LEVEL OF CARE SCREENING TOOL     IDENTIFICATION  Patient Name: Julie [facility]row Birthdate: [birth year] Sex: female Admission Date (Current Location): 09/19/2020  [facility] and [place] Number:  Chiropodist and Address:  [facility] [place]. [facility], [address] [place], [place] [address]      Provider Number: [id number]  Attending Physician Name and Address:  No att. providers found  Relative Name and Phone Number:  [name] [phone]    Current Level of Care: Hospital Recommended Level of Care: Assisted Living Facility,Nursing Facility,Family Care Home,Skilled Nursing Facility Prior Approval Number:    Date Approved/Denied:   PASRR Number:    Discharge Plan: Other (Comment) (Assisted Living or Nursing facility)    Current Diagnoses: Patient Active Problem List   Diagnosis Date Noted  . Diabetes (HCC) 09/23/2020  . Adjustment disorder with anxiety 09/13/2020  . Family discord 12/05/2019  . AMS (altered mental status) 05/14/2019  . Acute lower UTI 05/14/2019  . Adjustment disorder with mixed disturbance of emotions and conduct   . Bipolar I disorder with mania (HCC) 12/27/2018  . PAD (peripheral artery disease) (HCC) 10/16/2018  . GAD (generalized anxiety disorder) 03/04/2018  . Insomnia 03/04/2018  . Situational depression 03/04/2018  . Bipolar depression (HCC) 03/04/2018  . Acute kidney injury (HCC) 05/30/2017  . Bradycardia   . Lithium toxicity   . COPD GOLD 0 / still smoking  12/22/2016  . Generalized weakness 10/27/2015  . GERD (gastroesophageal reflux disease) 10/15/2015  . Bipolar 1 disorder, mixed, severe (HCC) 10/11/2015  . Diabetes type 2, controlled (HCC) 07/07/2015  . Morbid (severe) obesity due to excess calories (HCC) 07/07/2015  . Cigarette smoker 07/07/2015  . Hypothyroidism 07/07/2015  . HLD (hyperlipidemia) 11/07/2014  . Psoriasis 11/07/2014    Orientation RESPIRATION BLADDER Height & Weight      Place,Self  Normal Continent Weight: 105.7 kg Height:  5\' 1"  (154.9 cm)  BEHAVIORAL SYMPTOMS/MOOD NEUROLOGICAL BOWEL NUTRITION STATUS      Incontinent Diet (Normal)  AMBULATORY STATUS COMMUNICATION OF NEEDS Skin   Extensive Assist Verbally Normal                       Personal Care Assistance Level of Assistance  Bathing,Feeding,Dressing,Total care Bathing Assistance: Maximum assistance Feeding assistance: Limited assistance Dressing Assistance: Maximum assistance Total Care Assistance: Maximum assistance   Functional Limitations Info  Sight,Hearing,Speech Sight Info: Adequate Hearing Info: Adequate Speech Info: Adequate    SPECIAL CARE FACTORS FREQUENCY                       Contractures Contractures Info: Not present    Additional Factors Info  Code Status,Allergies Code Status Info: Full Allergies Info: None           Current Medications (09/26/2020):  This is the current hospital active medication list Current Facility-Administered Medications  Medication Dose Route Frequency Provider Last Rate Last Admin  . acetaminophen (TYLENOL) tablet 650 mg  650 mg Oral Q6H PRN Clapacs, 09/28/2020, MD   650 mg at 09/24/20 1722  . alum & mag hydroxide-simeth (MAALOX/MYLANTA) 200-200-20 MG/5ML suspension 30 mL  30 mL Oral Q4H PRN Clapacs, John T, MD      . aspirin EC tablet 81 mg  81 mg Oral Daily Clapacs, 11/24/20, MD   81 mg at 09/25/20 0938  . benztropine (COGENTIN) tablet 0.5 mg  0.5 mg Oral BID 11/25/20, Amy  E, MD   0.5 mg at 09/25/20 0939  . buPROPion (WELLBUTRIN XL) 24 hr tablet 150 mg  150 mg Oral Daily Bartholomew Crews E, MD   150 mg at 09/25/20 0939  . diphenoxylate-atropine (LOMOTIL) 2.5-0.025 MG per tablet 2 tablet  2 tablet Oral QID PRN Antonieta Pert, MD      . divalproex (DEPAKOTE) DR tablet 500 mg  500 mg Oral QPM Antonieta Pert, MD   500 mg at 09/24/20 1731  . fenofibrate tablet 160 mg  160 mg Oral Daily Clapacs, Jackquline Denmark, MD   160 mg at 09/25/20  0939  . levothyroxine (SYNTHROID) tablet 175 mcg  175 mcg Oral Q0600 Comer Locket, MD   175 mcg at 09/26/20 0747  . loratadine (CLARITIN) tablet 10 mg  10 mg Oral Daily Clapacs, Jackquline Denmark, MD   10 mg at 09/25/20 0943  . magnesium hydroxide (MILK OF MAGNESIA) suspension 30 mL  30 mL Oral Daily PRN Clapacs, John T, MD      . metFORMIN (GLUCOPHAGE) tablet 500 mg  500 mg Oral Q breakfast Clapacs, Jackquline Denmark, MD   500 mg at 09/25/20 0944  . nicotine (NICODERM CQ - dosed in mg/24 hours) patch 14 mg  14 mg Transdermal Daily Antonieta Pert, MD   14 mg at 09/25/20 0942  . nystatin (MYCOSTATIN/NYSTOP) topical powder   Topical TID Laveda Abbe, NP   1 Units at 09/25/20 0940  . risperiDONE (RISPERDAL) tablet 4 mg  4 mg Oral QHS Comer Locket, MD   4 mg at 09/25/20 2058  . rosuvastatin (CRESTOR) tablet 40 mg  40 mg Oral Daily Comer Locket, MD   40 mg at 09/25/20 0955  . traZODone (DESYREL) tablet 100 mg  100 mg Oral QHS Comer Locket, MD         Discharge Medications: Please see discharge summary for a list of discharge medications.  Relevant Imaging Results:  Relevant Lab Results:   Additional Information [id number] Social Security  Aram Bee[place], [place]

## 2020-09-26 NOTE — Progress Notes (Signed)
   09/25/20 2058  Psych Admission Type (Psych Patients Only)  Admission Status Voluntary  Psychosocial Assessment  Patient Complaints None  Eye Contact Fair  Facial Expression Flat  Affect Anxious;Apprehensive  Speech Slow;Logical/coherent  Interaction Assertive  Motor Activity Slow;Unsteady  Appearance/Hygiene Body odor  Behavior Characteristics Cooperative  Mood Pleasant  Thought Process  Coherency Concrete thinking  Content WDL  Delusions None reported or observed  Perception WDL  Hallucination None reported or observed  Judgment Impaired  Confusion Mild  Danger to Self  Current suicidal ideation? Denies  Danger to Others  Danger to Others None reported or observed

## 2020-09-26 NOTE — [facility] Group Notes (Signed)
Type of Therapy and Topic:  Group Therapy - Healthy vs Unhealthy Coping Skills  Participation Level:  Active   Description of Group The focus of this group was to determine what unhealthy coping techniques typically are used by group members and what healthy coping techniques would be helpful in coping with various problems. Patients were guided in becoming aware of the differences between healthy and unhealthy coping techniques. Patients were asked to identify 2-3 healthy coping skills they would like to learn to use more effectively.  Therapeutic Goals 1. Patients learned that coping is what human beings do all day long to deal with various situations in their lives 2. Patients defined and discussed healthy vs unhealthy coping techniques 3. Patients identified their preferred coping techniques and identified whether these were healthy or unhealthy 4. Patients determined 2-3 healthy coping skills they would like to become more familiar with and use more often. 5. Patients provided support and ideas to each other   Summary of Patient Progress:  Julie Morrow attended group, interacted appropriately during peer discussions, and accepted the worksheets that were provided.

## 2020-09-26 NOTE — Progress Notes (Signed)
Patient refused to sit in the middle of the bed. Patient sitting on the edge of the bed. Staff remind patient tice it is not safe for her to sit so close to the edge of the bed. Patient said she refused to move to the middle of the bed. RN will be notify

## 2020-09-26 NOTE — Progress Notes (Signed)
1:1 sitter in place.  Pt is eating dinner.  Pt in no acute distress.

## 2020-09-26 NOTE — Evaluation (Signed)
Occupational Therapy Evaluation Patient Details Name: Julie Morrow MRN: [id number] DOB: Oct 02, 1950 Today's Date: 09/26/2020    History of Present Illness Pt is a 70 y/o female with PMHx of bipolar disorder, anxiety disorder, depression who presented to the [facility] ED 09/16/2020 and then admitted to [facility] on 09/20/2020. OT ordered/consulted to address and assess recent decline in cognition   Clinical Impression   While inpatient, pt had two unwitnessed falls and was sent to ED for workup. Pt presents with confusion, disorientation, yelling out in the middle of the night randomly. OT ordered and patient seen at [facility] to address and assess recent decline in patient cognition and ADL/iADL performance. Prior to admission, pt was independent with all ADL/iADLs, living at home with her 60 y/o father.  Session initiated at the request of the treatment team to further evaluate Julie Morrow's functional cognition in the context of discharge planning. Julie Morrow was educated on the reasoning and benefits of this assessment and was agreeable to the session. As part of this evaluation, Julie Morrow was administered the [facility] Mental Status Exam (SLUMS), an assessment tool for mild cognitive impairment and dementia. The assessment tool is comprised of several questions and is scored on three different levels - normal (27-30), mild cognitive disorder (21-26), and dementia (1-20).  Julie Morrow scored a 6.  During the assessment, patient demonstrated alternating attention, at times asking OT to repeat the question more than once. Other times, patient appeared distracted and looking around the room. Julie Morrow demonstrated appropriate initiation, evidenced by her ability to wait for instructions to be given before completing a task or answering a question, however demonstrated difficulty with time management, often taking longer periods of time to respond or work through a question and give a response. Julie Morrow demonstrated impaired  problem-solving abilities, taking a long period of time to respond or responding quickly with "I don't know" and declining to work through to find an answer. Pt presents with decreased short term memory AEB inability to recall five objects asked of her, name more than 4 animals in a minute, and recall information from a story shared to her. Pt presents with difficulty following one-step commands AEB pt asking OT to repeat the question and taking long periods of time to respond. Pt also presents with decreased awareness of safety/personal deficits.  Based upon OT evaluation and findings from completion of SLUMS evaluation tool, it is recommended that patient is not  appropriate to return home at this time and live independently d/t safety risk, cognitive impairments discussed/identified, and inability to safely engage in ADL/iADLs. Pt demonstrates behavior that inhibits/restricts participation in occupation and would benefit from continued OT services and assistance in the next venue of care (skilled nursing facility) with safety, medication management, meal preparation, dressing, hygiene, and mobility.     Follow Up Recommendations  SNF    Equipment Recommendations       Recommendations for Other Services       Precautions / Restrictions Precautions Precautions: None Restrictions Weight Bearing Restrictions: No      ADL either performed or assessed with clinical judgement   ADL Overall ADL's : Needs assistance/impaired Eating/Feeding: Set up   Grooming: Set up;Supervision/safety;Cueing for safety;Cueing for sequencing   Upper Body Bathing: Moderate assistance;With adaptive equipment;Cueing for safety;Cueing for sequencing;Sitting   Lower Body Bathing: Moderate assistance;+2 for physical assistance;+2 for safety/equipment;Cueing for safety;Cueing for sequencing;Sit to/from stand   Upper Body Dressing : Moderate assistance;Cueing for safety;Cueing for sequencing   Lower Body  Dressing: Moderate assistance;Cueing  for safety;Sit to/from stand;Cueing for sequencing   Toilet Transfer: Moderate assistance;Cueing for safety;Cueing for sequencing;Stand-pivot;Grab bars   Toileting- Clothing Manipulation and Hygiene: Minimal assistance;Set up;Cueing for safety;Cueing for compensatory techniques   Tub/ Shower Transfer: Moderate assistance;Cueing for safety;Cueing for sequencing;Stand-pivot;Grab bars;Shower seat   Functional mobility during ADLs: Moderate assistance;Wheelchair       Cognition Arousal/Alertness: Awake/alert Behavior During Therapy: WFL for tasks assessed/performed Overall Cognitive Status: Impaired/Different from baseline Area of Impairment: Orientation;Attention;Memory;Following commands;Safety/judgement;Awareness;Problem solving                 Orientation Level: Disoriented to;Time Current Attention Level: Selective;Alternating Memory: Decreased short-term memory Following Commands: Follows one step commands with increased time Safety/Judgement: Decreased awareness of safety;Decreased awareness of deficits   Problem Solving: Slow processing;Decreased initiation;Difficulty sequencing;Requires verbal cues General Comments: OT assessed pt cognition using SLUMS evaluation tool - pt scored in lowest range categorized as 'dementia'. Pt presents with impaired cognition, decreased short term memory, difficulty following one step commands, and decreased awareness of safety and deficits. Pt not appropriate to return home at this time d/t safety risk and inability to complete ADL/iADLs independently.   General Comments       Exercises     Shoulder Instructions      Home Living Family/patient expects to be discharged to:: Skilled nursing facility Living Arrangements: Alone                                               OT Problem List: Decreased cognition;Decreased safety awareness      OT Treatment/Interventions:       OT Goals(Current goals can be found in the care plan section) Acute Rehab OT Goals Patient Stated Goal: I want to get out of here (the hospital) OT Goal Formulation: All assessment and education complete, DC therapy  OT Frequency:     Barriers to D/C:            Co-evaluation              AM-PAC OT "6 Clicks" Daily Activity     Outcome Measure Help from another person eating meals?: A Little Help from another person taking care of personal grooming?: A Lot Help from another person toileting, which includes using toliet, bedpan, or urinal?: A Lot Help from another person bathing (including washing, rinsing, drying)?: A Lot Help from another person to put on and taking off regular upper body clothing?: A Lot Help from another person to put on and taking off regular lower body clothing?: A Little 6 Click Score: 14   End of Session Nurse Communication: Other (comment) (cognitive assessment)  Activity Tolerance: Patient tolerated treatment well Patient left: in chair;with nursing/sitter in room  OT Visit Diagnosis: Other symptoms and signs involving cognitive function                Time: 9937-1696 OT Time Calculation (min): 31 min Charges:  OT General Charges $OT Visit: 1 Visit OT Evaluation $OT Eval Low Complexity: 1 Low OT Treatments $Cognitive Funtion inital: Initial 15 mins  09/26/2020  Donne Hazel, MOT, OTR/L  [place] Samaya Boardley 09/26/2020, 3:59 PM

## 2020-09-26 NOTE — Progress Notes (Signed)
Patient  Yelling out uttering words that do not make sense. Staff remind patient she is in the hospital. She has to lower her voice there are other patients asleep.

## 2020-09-26 NOTE — [facility] Counselor (Signed)
CSW completed an FL2 for the Pt in attempts to get the patient into an Assisted Living Facility.  CSW then contacted 2 Assisted Living Facilities in [facility] [place] where CSW left a voicemail for [facility] [phone] and [facility] where CSW was told that they are not accepting patients at this time due to renovations [phone].  CSW then contacted the [facility] [employer] [facility].  CSW spoke with a representative from [facility] [employer] [place]) at [phone] who states that Medicare will not pay for room and board for an Assisted Living Facility or Nursing Home.  The representative states that the patient would need to apply for Medicaid or could have a referral sent to [facility] for in home skilled nursing care.  CSW will look into this option and will send a referral to this agency.

## 2020-09-26 NOTE — Progress Notes (Signed)
Female RN attempted to help patient with clean catch urine sample.  Pt refused to clean herself appropriately for the clean catch urine sample, therefore sample could not be collected.

## 2020-09-26 NOTE — Plan of Care (Signed)
MOCA testing done on 09/26/20 and score 10/30.   Bartholomew Crews, MD, Celene Skeen

## 2020-09-26 NOTE — Progress Notes (Signed)
   09/26/20 1030  Psych Admission Type (Psych Patients Only)  Admission Status Voluntary  Psychosocial Assessment  Patient Complaints Anxiety  Eye Contact Fair  Facial Expression Flat  Affect Anxious;Apprehensive  Speech Slow;Logical/coherent  Interaction Assertive  Motor Activity Slow;Unsteady  Appearance/Hygiene Body odor  Behavior Characteristics Cooperative  Mood Pleasant  Thought Process  Coherency Concrete thinking  Content WDL  Delusions None reported or observed  Perception WDL  Hallucination None reported or observed  Judgment Impaired  Confusion Mild  Danger to Self  Current suicidal ideation? Denies  Danger to Others  Danger to Others None reported or observed

## 2020-09-26 NOTE — Progress Notes (Signed)
Recreation Therapy Notes  Date:  5.13.22 Time: 0930 Location: [address] Dayroom  Group Topic: Stress Management  Goal Area(s) Addresses:  Patient will identify positive stress management techniques. Patient will identify benefits of using stress management post d/c.  Intervention: Stress Management  Activity :  Meditation.  LRT played a meditation that focused on making the most of each opportunity we're given.  The meditation went on to talk about each breath being a fresh start and each day a blank page to start over.  Education:  Stress Management, Discharge Planning.   Education Outcome: Acknowledges Education  Clinical Observations/Feedback: Pt did not attend group session.   Xavien Dauphinais, LRT/CTRS         Nasim Garofano A 09/26/2020 11:25 AM 

## 2020-09-26 NOTE — [facility] Counselor (Signed)
CSW spoke with Julie Morrow with Adult Protective Services at [phone] who states that there is no date or determination on Guardianship.  Mrs. Julie Morrow states that a decision will be made in the next 30 days but they will be making weekly visits during that time and these visits will start on 09/30/2020 at 11:00am.  During the 30 days a competency exam will be provided.  Mrs. Julie Morrow states that Julie Morrow scored a 20 out of 30 on her mini competency exam.  CSW will continue contact with Julie Morrow to determine the next steps for Julie Morrow.  If unable to contact Mrs. Julie Morrow you may also contact her supervisor Julie Morrow at [phone].

## 2020-09-26 NOTE — Progress Notes (Signed)
PT was notified but did not attend group. 

## 2020-09-27 DIAGNOSIS — F319 Bipolar disorder, unspecified: Secondary | ICD-10-CM | POA: Diagnosis not present

## 2020-09-27 DIAGNOSIS — F0390 Unspecified dementia without behavioral disturbance: Secondary | ICD-10-CM

## 2020-09-27 LAB — URINALYSIS, COMPLETE (UACMP) WITH MICROSCOPIC
Bilirubin Urine: NEGATIVE
Glucose, UA: NEGATIVE mg/dL
Hgb urine dipstick: NEGATIVE
Ketones, ur: NEGATIVE mg/dL
Nitrite: POSITIVE — AB
Protein, ur: NEGATIVE mg/dL
Specific Gravity, Urine: 1.012 (ref 1.005–1.030)
WBC, UA: >50 WBC/hpf — ABNORMAL HIGH (ref 0–5)
pH: 5 (ref 5.0–8.0)

## 2020-09-27 LAB — GLUCOSE, CAPILLARY
Glucose-Capillary: 91 mg/dL (ref 70–99)
Glucose-Capillary: 174 mg/dL — ABNORMAL HIGH (ref 70–99)

## 2020-09-27 LAB — VALPROIC ACID LEVEL: Valproic Acid Lvl: 16 ug/mL — ABNORMAL LOW (ref 50.0–100.0)

## 2020-09-27 MED ORDER — DIVALPROEX SODIUM 500 MG PO DR TAB
500.0000 mg | DELAYED_RELEASE_TABLET | Freq: Every evening | ORAL | Status: DC
  Administered 2020-09-27: 500 mg via ORAL
  Filled 2020-09-27 (×2): qty 1

## 2020-09-27 NOTE — Progress Notes (Signed)
   09/27/20 2356  COVID-19 Daily Checkoff  Have you had a fever (temp > 37.80C/100F)  in the past 24 hours?  No  If you have had runny nose, nasal congestion, sneezing in the past 24 hours, has it worsened? No  COVID-19 EXPOSURE  Have you traveled outside the state in the past 14 days? No  Have you been in contact with someone with a confirmed diagnosis of COVID-19 or PUI in the past 14 days without wearing appropriate PPE? No  Have you been living in the same home as a person with confirmed diagnosis of COVID-19 or a PUI (household contact)? No  Have you been diagnosed with COVID-19? No

## 2020-09-27 NOTE — Progress Notes (Signed)
D.  Pt resting in bed awake, no distress noted.  Night medications given and taken.    A.  1:1 continued as ordered for patient safety  R.  Pt remains safe on unit.

## 2020-09-27 NOTE — Progress Notes (Signed)
Nursing Note 1:1   D..,  Pt up to bathroom,with assistance, appears in not acute distress.    A.  1:1 continued as ordered for patient safety  R.  Pt remains safe on the unit.

## 2020-09-27 NOTE — Progress Notes (Signed)
1:1 Note Pt. On toilet 1:1 present. Pt. Had urinated in bed. Pt. Taken to the shower room. Safety maintained. 1:1 continues.

## 2020-09-27 NOTE — Progress Notes (Addendum)
[facility] MD Progress Note  09/27/2020 2:02 PM Julie Morrow  MRN:  [id number]   Chief Complaint: depression and SI  Subjective:  Julie Morrow is a 70 y.o. female with a past psychiatric history significant for bipolar d/o and anxiety, who was initially admitted for inpatient psychiatric hospitalization on 09/19/2020 for management of worsening depression and SI. The patient is currently on Hospital Day 8.   Chart Review from last 24 hours:  The patient's chart was reviewed and nursing notes were reviewed. The patient's case was discussed in multidisciplinary team meeting. Per nursing, no behavioral issues noted yesterday or overnight. She remains on 1:1 for falls and confusion. She did not attend groups. Per MAR, she was compliant with scheduled medications and did not require PRNs.   Information Obtained Today During Patient Interview: The patient was seen and evaluated on the unit with 1:1 present. She states she is "feeling pretty good" today and voices no physical complaints. Per nursing, she wet the bed and had 3 person assist to shower and change today. She denies any urinary symptoms and seems unaware that she had urinary incontinence. She denies SI, HI, AVH, paranoia, thought insertion/withdrawal, or ideas of reference. She does make vague reference to belief in thought broadcasting but cannot give details. She was up in the wheelchair and in the dayroom earlier and was encouraged to ambulate with walker and be out of bed. She denies medication side-effects. She states she moved her bowels yesterday and reports good appetite. She states her sleep was fair overnight.    Principal Problem: Bipolar depression (HCC) Diagnosis: Principal Problem:   Bipolar depression (HCC) Active Problems:   HLD (hyperlipidemia)   Hypothyroidism   Diabetes (HCC)   Major neurocognitive disorder (HCC)  Total Time Spent in Direct Patient Care:  I personally spent 30 minutes on the unit in direct patient care. The  direct patient care time included face-to-face time with the patient, reviewing the patient's chart, communicating with other professionals, and coordinating care. Greater than 50% of this time was spent in counseling or coordinating care with the patient regarding goals of hospitalization, psycho-education, and discharge planning needs.  Past Psychiatric History: see admission H&P  Past Medical History:  Past Medical History:  Diagnosis Date  . Anxiety   . Bipolar 1 disorder (HCC)   . Bradycardia   . Depression   . Diabetes mellitus, type II (HCC)    Patient takes Glucotrol and Januvia  . Hypothyroidism   . Lithium toxicity     Past Surgical History:  Procedure Laterality Date  . ABDOMINAL HYSTERECTOMY    . CHOLECYSTECTOMY     Family History:  Family History  Problem Relation Age of Onset  . Dementia Mother   . Arthritis Father    Family Psychiatric  History: see admission H&P  Social History:  Social History   Substance and Sexual Activity  Alcohol Use No  . Alcohol/week: 0.0 standard drinks     Social History   Substance and Sexual Activity  Drug Use No    Social History   Socioeconomic History  . Marital status: Single    Spouse name: Not on file  . Number of children: Not on file  . Years of education: Not on file  . Highest education level: Not on file  Occupational History  . Not on file  Tobacco Use  . Smoking status: Current Every Day Smoker    Packs/day: 0.50    Years: 20.00    Pack  years: 10.00    Types: Cigarettes    Start date: 11/06/1984  . Smokeless tobacco: Never Used  Vaping Use  . Vaping Use: Never used  Substance and Sexual Activity  . Alcohol use: No    Alcohol/week: 0.0 standard drinks  . Drug use: No  . Sexual activity: Not Currently  Other Topics Concern  . Not on file  Social History Narrative  . Not on file   Social Determinants of Health   Financial Resource Strain: Not on file  Food Insecurity: Not on file   Transportation Needs: Not on file  Physical Activity: Not on file  Stress: Not on file  Social Connections: Not on file   Sleep: Fair  Appetite:  Fair  Current Medications: Current Facility-Administered Medications  Medication Dose Route Frequency Provider Last Rate Last Admin  . acetaminophen (TYLENOL) tablet 650 mg  650 mg Oral Q6H PRN Clapacs, Jackquline Denmark, MD   650 mg at 09/24/20 1722  . alum & mag hydroxide-simeth (MAALOX/MYLANTA) 200-200-20 MG/5ML suspension 30 mL  30 mL Oral Q4H PRN Clapacs, John T, MD      . aspirin EC tablet 81 mg  81 mg Oral Daily Clapacs, Jackquline Denmark, MD   81 mg at 09/27/20 0926  . benztropine (COGENTIN) tablet 0.5 mg  0.5 mg Oral BID Bartholomew Crews E, MD   0.5 mg at 09/27/20 0927  . buPROPion (WELLBUTRIN XL) 24 hr tablet 150 mg  150 mg Oral Daily Bartholomew Crews E, MD   150 mg at 09/27/20 0927  . diphenoxylate-atropine (LOMOTIL) 2.5-0.025 MG per tablet 2 tablet  2 tablet Oral QID PRN Antonieta Pert, MD      . divalproex (DEPAKOTE) DR tablet 500 mg  500 mg Oral QPM Breken Nazari E, MD      . fenofibrate tablet 160 mg  160 mg Oral Daily Clapacs, Jackquline Denmark, MD   160 mg at 09/27/20 0927  . levothyroxine (SYNTHROID) tablet 175 mcg  175 mcg Oral Q0600 Comer Locket, MD   175 mcg at 09/27/20 0648  . loratadine (CLARITIN) tablet 10 mg  10 mg Oral Daily Clapacs, Jackquline Denmark, MD   10 mg at 09/27/20 0926  . magnesium hydroxide (MILK OF MAGNESIA) suspension 30 mL  30 mL Oral Daily PRN Clapacs, John T, MD      . metFORMIN (GLUCOPHAGE) tablet 500 mg  500 mg Oral Q breakfast Clapacs, John T, MD   500 mg at 09/27/20 0927  . nicotine (NICODERM CQ - dosed in mg/24 hours) patch 14 mg  14 mg Transdermal Daily Antonieta Pert, MD   14 mg at 09/27/20 0928  . nystatin (MYCOSTATIN/NYSTOP) topical powder   Topical TID Laveda Abbe, NP   Given at 09/27/20 1205  . risperiDONE (RISPERDAL) tablet 4 mg  4 mg Oral QHS Comer Locket, MD   4 mg at 09/26/20 2109  . rosuvastatin (CRESTOR)  tablet 40 mg  40 mg Oral Daily Comer Locket, MD   40 mg at 09/27/20 0928  . traZODone (DESYREL) tablet 100 mg  100 mg Oral QHS Bartholomew Crews E, MD   100 mg at 09/26/20 2109    Lab Results:  Results for orders placed or performed during the hospital encounter of 09/19/20 (from the past 48 hour(s))  CBC with Differential     Status: Abnormal   Collection Time: 09/25/20  2:20 PM  Result Value Ref Range   WBC 7.8 4.0 - 10.5 K/uL  RBC 4.89 3.87 - 5.11 MIL/uL   Hemoglobin 15.2 (H) 12.0 - 15.0 g/dL   HCT 78.2 (H) 95.6 - 21.3 %   MCV 96.9 80.0 - 100.0 fL   MCH 31.1 26.0 - 34.0 pg   MCHC 32.1 30.0 - 36.0 g/dL   RDW 08.6 57.8 - 46.9 %   Platelets 160 150 - 400 K/uL   nRBC 0.0 0.0 - 0.2 %   Neutrophils Relative % 58 %   Neutro Abs 4.5 1.7 - 7.7 K/uL   Lymphocytes Relative 32 %   Lymphs Abs 2.5 0.7 - 4.0 K/uL   Monocytes Relative 8 %   Monocytes Absolute 0.6 0.1 - 1.0 K/uL   Eosinophils Relative 1 %   Eosinophils Absolute 0.1 0.0 - 0.5 K/uL   Basophils Relative 1 %   Basophils Absolute 0.0 0.0 - 0.1 K/uL   Immature Granulocytes 0 %   Abs Immature Granulocytes 0.02 0.00 - 0.07 K/uL    Comment: Performed at [facility], [address], [place], [place] [address]  Comprehensive metabolic panel     Status: Abnormal   Collection Time: 09/25/20  2:20 PM  Result Value Ref Range   Sodium 140 135 - 145 mmol/L   Potassium 4.4 3.5 - 5.1 mmol/L   Chloride 105 98 - 111 mmol/L   CO2 24 22 - 32 mmol/L   Glucose, Bld 100 (H) 70 - 99 mg/dL    Comment: Glucose reference range applies only to samples taken after fasting for at least 8 hours.   BUN 17 8 - 23 mg/dL   Creatinine, Ser 8.41 0.44 - 1.00 mg/dL   Calcium 9.8 8.9 - 32.4 mg/dL   Total Protein 7.3 6.5 - 8.1 g/dL   Albumin 4.2 3.5 - 5.0 g/dL   AST 19 15 - 41 U/L   ALT 21 0 - 44 U/L   Alkaline Phosphatase 53 38 - 126 U/L   Total Bilirubin 0.3 0.3 - 1.2 mg/dL   GFR, Estimated >40 >10 mL/min    Comment:  (NOTE) Calculated using the CKD-EPI Creatinine Equation (2021)    Anion gap 11 5 - 15    Comment: Performed at [facility], [address] [place], [place] [address]  Ammonia     Status: None   Collection Time: 09/25/20  3:58 PM  Result Value Ref Range   Ammonia 19 9 - 35 umol/L    Comment: Performed at [facility], [address], [place], [place] [address]  Glucose, capillary     Status: Abnormal   Collection Time: 09/25/20  8:52 PM  Result Value Ref Range   Glucose-Capillary 111 (H) 70 - 99 mg/dL    Comment: Glucose reference range applies only to samples taken after fasting for at least 8 hours.   Comment 1 Notify RN    Comment 2 Document in Chart   Glucose, capillary     Status: Abnormal   Collection Time: 09/26/20  5:52 AM  Result Value Ref Range   Glucose-Capillary 101 (H) 70 - 99 mg/dL    Comment: Glucose reference range applies only to samples taken after fasting for at least 8 hours.   Comment 1 Notify RN    Comment 2 Document in Chart   Glucose, capillary     Status: Abnormal   Collection Time: 09/26/20 12:10 PM  Result Value Ref Range   Glucose-Capillary 110 (H) 70 - 99 mg/dL    Comment: Glucose reference range  applies only to samples taken after fasting for at least 8 hours.  Glucose, capillary     Status: None   Collection Time: 09/27/20 12:07 PM  Result Value Ref Range   Glucose-Capillary 91 70 - 99 mg/dL    Comment: Glucose reference range applies only to samples taken after fasting for at least 8 hours.    Blood Alcohol level:  Lab Results  Component Value Date   ETH <10 09/16/2020   ETH <10 12/03/2019    Metabolic Disorder Labs: Lab Results  Component Value Date   HGBA1C 6.5 (H) 09/16/2020   MPG 139.85 09/16/2020   MPG 128.37 12/28/2018   Lab Results  Component Value Date   PROLACTIN 15.1 07/03/2015   Lab Results  Component Value Date   CHOL 106 09/16/2020   TRIG 315 (H) 09/16/2020   HDL 28 (L)  09/16/2020   CHOLHDL 3.8 09/16/2020   VLDL 63 (H) 09/16/2020   LDLCALC 15 09/16/2020   LDLCALC 38 12/28/2018    Physical Findings: AIMS: Facial and Oral Movements Muscles of Facial Expression: None, normal Lips and Perioral Area: None, normal Jaw: None, normal Tongue: None, normal,Extremity Movements Upper (arms, wrists, hands, fingers): None, normal Lower (legs, knees, ankles, toes): None, normal, Trunk Movements Neck, shoulders, hips: None, normal, Overall Severity Severity of abnormal movements (highest score from questions above): None, normal Incapacitation due to abnormal movements: None, normal Patient's awareness of abnormal movements (rate only patient's report): No Awareness, Dental Status Current problems with teeth and/or dentures?: No Does patient usually wear dentures?: No      Musculoskeletal: Strength & Muscle Tone: within normal limits Gait & Station: untested Patient leans: N/A  Psychiatric Specialty Exam: Physical Exam Vitals reviewed.  HENT:     Head: Normocephalic.  Pulmonary:     Effort: Pulmonary effort is normal.  Neurological:     General: No focal deficit present.     Mental Status: She is alert.     Review of Systems  Respiratory: Negative for shortness of breath.   Cardiovascular: Negative for chest pain.  Gastrointestinal: Negative for abdominal pain, constipation, diarrhea, nausea and vomiting.  Musculoskeletal:       Nonspecific right leg pain  Neurological: Negative for dizziness and headaches.  Psychiatric/Behavioral: Positive for confusion.    BP 116/74, HR 74, RR 18, sats 98%  General Appearance: obese, casually dressed with improved hygiene  Eye Contact:  Fair  Speech:  Mumbling quality at times, regular rate  Volume:  Normal  Mood:  Aloof, ambivalent  Affect:  Brighter affect - can smile on exam  Thought Process:  Concrete, tangential  Orientation:  Oriented to month, year, [place], and city  Thought Content:  Denies AVH,  paranoia, ideas of reference,thought insertion/withdrawal but endorses belief in thought broadcasting; not acutely responding to internal/external stimuli on exam  Suicidal Thoughts:  No  Homicidal Thoughts:  No  Memory: Poor  Judgement:  Limited  Insight:  Lacking  Psychomotor Activity:  Decreased   Concentration:  Concentration: Fair  Recall:  Poor  Fund of Knowledge:  Limited   Language:  Fair  Akathisia:  Negative  Assets:  Desire for Improvement Resilience  ADL's:  Needs prompts and assistance  Sleep:  6.75 hours   Treatment Plan Summary: Diagnoses / Active Problems: Bipolar d/o MRE depressed Major Neurocognitive impairment - likely multifactorial Type II DM Hypothyroidism Hypertriglyceridemia Tobacco Use d/o  PLAN: 1. Safety and Monitoring:  -- Voluntary admission to inpatient psychiatric unit for safety, stabilization and  treatment  -- Daily contact with patient to assess and evaluate symptoms and progress in treatment  -- Patient's case to be discussed in multi-disciplinary team meeting  -- Observation Level : q15 minute checks  -- Vital signs:  q12 hours  -- Precautions: suicide  2. Psychiatric Diagnoses and Treatment:   Bipolar d/o MRE depressed  Major cognitive impairment - likely multifactorial  -- Patient on 1:1 for confusion and fall risk; Continue walker or wheelchair for ambulation  -- MOCA 10/30 on 09/26/20  -- OT evaluated patient on 09/26/20 and she scored 6 on her SLUMS evaluation with recommendations that patient is not appropriate to return home at this time and live independently d/t safety risk, cognitive impairments, and inability to safely engage in ADL/iADLs.  -- Per social work patient scored 20/30 on her mini competency exam done by APS and a decision will be made in the next 30 days about potential guardianship  -- While in the ED on 09/25/20 patient had Head CT noncontrast and CT cervical spine which were negative and Ammonia level 19, CMP WNL  except for glucose 100, and CBC with WBC 7.8, H/H 15.2/47.4, platelets 160)  -- SW looking into ALF referral with FL2 completed -- Continue Cogentin 0.5mg  bid for side effects of medication -- Monitor for agitation/psychosis with dose reduction in Risperdal to 4mg  po qhs - no recent behavioral issues or agitation noted -- Attempting to contact her outpatient provider, Dr. [phone]) with her signed consent for collateral and better understanding of her previous medication regimen and clinical baseline -- Ordered clean catch UA given her recent confusion and urinary incontinence -- Continue VPA 500mg  every evening for mood stability and nightly agitation (AST 19, ALT 21, WBC 7.8, H/H 15.2/47.4, platelets 160) VPA level pending for tonight since patient did not get labs this morning  -- Continue Trazodone 100mg  po qhs PRN for insomnia while on 1:1 monitoring   -- Continue Wellbutrin XL 150mg  daily for depression   -- Metabolic profile and EKG monitoring obtained while on an atypical antipsychotic (BMI:44.02;  Lipid Panel: cholesterol 106, triglycerides 315, HDL 28, LDL 15; HbgA1c: 6.5; QTc:442ms)   -- Encouraged patient to participate in unit milieu and in scheduled group therapies   -- Short Term Goals: Ability to identify changes in lifestyle to reduce recurrence of condition will improve and Ability to identify and develop effective coping behaviors will improve  -- Long Term Goals: Improvement in symptoms so as ready for discharge   3. Medical Issues Being Addressed:   Hypertriglyceridemia  -- Continue Fenofibrate 160mg  daily  -- Continue Crestor 40mg  daily    Type II DM  -- Discontinue Glipizide to 2.5mg  given recent blood sugar trends  -- Continue Glucophage 500mg  daily  -- Checking FSBS tid (recent blood sugars 110, 101, 111)   Hypothyroidism  -- Continue Synthroid 07-22-2000 daily  -- TSH 1.116   Seasonal Allergies  -- Continue Claritin 10mg  daily   Tobacco Use  d/o  -- Smoking cessation encouraged  -- Continue Nicoderm patch 14mg /24 hours   Age Indeterminate septal infarct on EKG  -- Patient will remain on ASA 81mg  daily  -- Patient currently asymptomatic - spoke with Dr. , on call for cardiology, who feels was likely lead placement and he recommends f/u with PCP as an outpatient - no further inpatient w/u needed at this time  -- Holding restart of Cozaar since patient has not been compliant with med prior to admission and monitoring blood pressures  High Risk Medication Use: -- The complexity of this patient's case involves drug therapy with Depakote which requires intensive monitoring for toxicity. This is in part due to a narrow therapeutic window as well as potential for toxicity with this medication.   4. Discharge Planning:   -- Social work and case management to assist with discharge planning and identification of hospital follow-up needs prior to discharge - APS referral pending and asked patient to consider ALF referral  -- Estimated LOS: TBD  -- Discharge Concerns: Need to establish a safety plan; Medication compliance and effectiveness  -- Discharge Goals: Return home with outpatient referrals for mental health follow-up including medication management/psychotherapy  Comer Locket, MD, FAPA 09/27/2020, 2:02 PM

## 2020-09-27 NOTE — [facility] Group Notes (Incomplete)
.  Psychoeducational Group Note  Date:09/27/2020 Time: 0900-1000    Goal Setting   Purpose of Group: This group helps to provide patients with the steps of setting a goal that is specific, measurable, attainable, realistic and time specific. A discussion on how we keep ourselves stuck with negative self talk.    Participation Level:  Active  Participation Quality:  Appropriate  Affect:  Appropriate  Cognitive:  Appropriate  Insight:  Improving  Engagement in Group:  Engaged  Additional Comments:  ...  Aralyn Nowak A  

## 2020-09-27 NOTE — Progress Notes (Signed)
   09/26/20 2109  Psych Admission Type (Psych Patients Only)  Admission Status Voluntary  Psychosocial Assessment  Patient Complaints None  Eye Contact Fair  Facial Expression Flat  Affect Apprehensive  Speech Slow;Logical/coherent  Interaction Assertive  Motor Activity Slow;Unsteady  Appearance/Hygiene Body odor;Disheveled  Behavior Characteristics Appropriate to situation;Cooperative  Mood Pleasant  Thought Administrator, sports thinking  Content WDL  Delusions None reported or observed  Perception WDL  Hallucination None reported or observed  Judgment Impaired  Confusion Mild  Danger to Self  Current suicidal ideation? Denies  Danger to Others  Danger to Others None reported or observed

## 2020-09-27 NOTE — Progress Notes (Signed)
1:1 Note: Pt. Went to group, walked with 2 person assist with gait belt and walker, wheelchair being pushed behind her. Pt. Toileted and given a snack. Female MHT with pt. Safety maintained. Continue 1:1.

## 2020-09-27 NOTE — [facility] Group Notes (Incomplete)
.  Psychoeducational Group Note    Date:09/27/2020 Time: 1300-1400    Life Skills:  A group where two lists are made. What people need and what are things that we do that are healthy. The lists are developed by the patients and it is explained that we often do the actions that are not healthy to get our list of needs met.   Purpose of Group: . The group focus' on teaching patients on how to identify their needs and how to develop the coping skills needed to get their needs met  Participation Level:  Active  Participation Quality:  Appropriate  Affect:  Appropriate  Cognitive:  Oriented  Insight:  Improving  Engagement in Group:  Engaged  Additional Comments:  ...  Paulino Rily

## 2020-09-27 NOTE — [facility] Group Notes (Signed)
[facility] LCSW Group Therapy Note  Date/Time:    09/27/2020 11:00AM-12:00PM  Type of Therapy and Topic:  Group Therapy:  Healthy vs Unhealthy Coping Skills  Participation Level:  Did Not Attend   Description of Group:  The focus of this group was to determine what unhealthy and healthy coping techniques typically are used by group members and why they tend to fall back on those particular techniques of handling hard situations. Patients were guided in becoming aware of the differences between healthy and unhealthy coping techniques.  Facilitator led a discussion about the benefits and costs of returning to old unhealthy coping techniques, as well as the benefits and costs of learning new healthier coping skills.  Therapeutic Goals 1. Patients learned that coping is what human beings do all day long to deal with various situations in their lives 2. Patients defined and discussed healthy vs unhealthy coping techniques 3. Patients came to understand the the reasons human beings often return to old coping techniques that they know are unhelpful 4. Patients were provided with motivation to consider new means of coping that may be more healthy and helpful 5. Patients provided support and ideas to each other  Summary of Patient Progress:  N/A   Therapeutic Modalities Psychoeducation Motivational Interviewing   Ambrose Mantle, LCSW 09/27/2020, 4:34 PM

## 2020-09-27 NOTE — Progress Notes (Signed)
   09/27/20 0900  Vital Signs  Pulse Rate 74  Pulse Rate Source Dinamap  Resp 18  BP 116/74  BP Location Left Arm  BP Method Automatic  Patient Position (if appropriate) Sitting  Oxygen Therapy  SpO2 98 %   D: Patient denies SI/HI/AVH. Patient rated both anxiety and depression10/10. Pt. Complained of some generalized pain.In the morning pt made some nonsensical statements.  Pt. Was incontinent this morning and was given a shower in the tub room with 2 nurses and a MHT assisting. Pt.'s room was malodorous. Linens were changed and mattress wiped down. Pt. Was walked with a gait belt, 2 person assist and wheel chair being rolled behind her x2.  On the second walk down the hall pt. Became angry and didn't want to walk anymore. Pt. Was a bit on steady with the walker and impulsive.Pt. was toileted x3 with a 2 person assist. When pt. Was toileted she stated that she "didn't have to go" then pt. Urinated. Pt. Was not able to clean self and needed assistance. Pt.'s blood sugar before lunch was 91. Pt. Was able to give a urine sample.  A:  Patient took scheduled medicine.  Support and encouragement provided Routine safety checks conducted every 15 minutes. Patient  Informed to notify staff with any concerns.   R: Pt. Has an MHT for 1:1. Safety maintained. 1:1 continues.

## 2020-09-27 NOTE — Progress Notes (Signed)
Psychoeducational Group Note  Date:  09/27/2020 Time: 2000 Group Topic/Focus:  wrap up group  Participation Level: Did Not Attend  Participation Quality:  Not Applicable  Affect:  Not Applicable  Cognitive:  Not Applicable  Insight:  Not Applicable  Engagement in Group: Not Applicable  Additional Comments:  Did not attend.   Marcille Buffy 09/27/2020, 10:14 PM

## 2020-09-28 DIAGNOSIS — F319 Bipolar disorder, unspecified: Secondary | ICD-10-CM | POA: Diagnosis not present

## 2020-09-28 LAB — GLUCOSE, CAPILLARY
Glucose-Capillary: 115 mg/dL — ABNORMAL HIGH (ref 70–99)
Glucose-Capillary: 184 mg/dL — ABNORMAL HIGH (ref 70–99)
Glucose-Capillary: 107 mg/dL — ABNORMAL HIGH (ref 70–99)

## 2020-09-28 MED ORDER — POLYETHYLENE GLYCOL 3350 17 G PO PACK
17.0000 g | PACK | Freq: Every day | ORAL | Status: DC
  Administered 2020-09-28 – 2020-10-07 (×10): 17 g via ORAL
  Filled 2020-09-28 (×11): qty 1

## 2020-09-28 MED ORDER — DOCUSATE SODIUM 100 MG PO CAPS
100.0000 mg | ORAL_CAPSULE | Freq: Every day | ORAL | Status: DC
  Administered 2020-09-28 – 2020-10-05 (×8): 100 mg via ORAL
  Filled 2020-09-28 (×9): qty 1

## 2020-09-28 MED ORDER — TRAZODONE HCL 100 MG PO TABS
100.0000 mg | ORAL_TABLET | Freq: Every evening | ORAL | Status: DC | PRN
  Filled 2020-09-28: qty 1

## 2020-09-28 MED ORDER — NITROFURANTOIN MONOHYD MACRO 100 MG PO CAPS
100.0000 mg | ORAL_CAPSULE | Freq: Two times a day (BID) | ORAL | Status: AC
  Administered 2020-09-28 – 2020-10-04 (×14): 100 mg via ORAL
  Filled 2020-09-28 (×16): qty 1

## 2020-09-28 MED ORDER — DIVALPROEX SODIUM ER 250 MG PO TB24
750.0000 mg | ORAL_TABLET | Freq: Every day | ORAL | Status: DC
  Administered 2020-09-28 – 2020-10-02 (×5): 750 mg via ORAL
  Filled 2020-09-28 (×6): qty 3

## 2020-09-28 NOTE — Progress Notes (Signed)
   09/28/20 2353  COVID-19 Daily Checkoff  Have you had a fever (temp > 37.80C/100F)  in the past 24 hours?  No  If you have had runny nose, nasal congestion, sneezing in the past 24 hours, has it worsened? No  COVID-19 EXPOSURE  Have you traveled outside the state in the past 14 days? No  Have you been in contact with someone with a confirmed diagnosis of COVID-19 or PUI in the past 14 days without wearing appropriate PPE? No  Have you been living in the same home as a person with confirmed diagnosis of COVID-19 or a PUI (household contact)? No  Have you been diagnosed with COVID-19? No

## 2020-09-28 NOTE — Progress Notes (Signed)
Writer tried to help patient to the bathroom. Writer explain she can stand and pivot to sit in the wheelchair. Patient refused to stand and pivot to sit in the wheelchair.Patient refused to go to the bathroom. Writer explain to the patient she's wet and she needs to take off the wet clothing.Patient refused to take wet clothing off.  Writer began to remove  the two wet pads. Patient told the writer I need to clean her up and remove her wet clothing. Writer explain to the patient I need her assistant to help me. Patient told the writer she refused to help.

## 2020-09-28 NOTE — Progress Notes (Signed)
Patient was awaken by RN to sit up on the side of the bed to take morning medication.RN advise patient she will help her to the bathroom if she need to go. Patient was persistent she can not get up. Patient said, she needs to go home.RN explain to the patient she can not go home because she is a patient in the hospital. RN explain patient she needs to  take her morning medication before breakfast. Patient took her medication. RN explain she needs  to have lab work done. Garnet RN asked patient to have lab work done.Patient refused lab work

## 2020-09-28 NOTE — Progress Notes (Signed)
1:1 Nursing Note  D.  Pt up to bathroom very soon after MHT had previously taken her.  Noted that patient, bed, and bedding was wet.  Pt appeared in no acute distress, did not remember going to bathroom earlier and did not believe she was wet.  Pt stated "well I'm going home tomorrow anyway"  A.  MHT and this Clinical research associate changed bedding and Pt/s clothing.  Disposable underwear and pad placed on Pt as well as padding placed under bedding.  Pt tolerated well, pleasantly confused.  R.  Pt remains safe on unit, will continue to monitor 1:1 as ordered.

## 2020-09-28 NOTE — Progress Notes (Signed)
1:1 note  Pt has been up to chair since last encounter. Pt toileted q2h. Pt provided meal. Blood sugar obtained. Pt resting now. Pt safe on the unit. No distress noted. q23m safety checks implemented and continued. Will continue to monitor. 1:1 continues for safety. Sitter within arms reach of pt.

## 2020-09-28 NOTE — [facility] Group Notes (Signed)
Adult Psychoeducational Group Not Date:  09/28/2020 Time:  0900-1045 Group Topic/Focus: PROGRESSIVE RELAXATION. A group where deep breathing is taught and tensing and relaxation muscle groups is used. Imagery is used as well.  Pts are asked to imagine 3 pillars that hold them up when they are not able to hold themselves up.  Participation Level:  Did not attend   Julie Morrow A   

## 2020-09-28 NOTE — Progress Notes (Signed)
Psychoeducational Group Note  Date:  09/28/2020 Time:  2015  Group Topic/Focus:  wrap up group  Participation Level: Did Not Attend  Participation Quality:  Not Applicable  Affect:  Not Applicable  Cognitive:  Not Applicable  Insight:  Not Applicable  Engagement in Group: Not Applicable  Additional Comments: Did not attend.  Marcille Buffy 09/28/2020, 10:15 PM

## 2020-09-28 NOTE — Progress Notes (Signed)
1:1 note  Pt found in bed; allowed to rest. Sitter within arms reach. No signs of distress noted with equal and unlabored breathing. Pt safe on the unit. q27m safety checks implemented and continued. Will continue to monitor. 1:1 continues for safety.

## 2020-09-28 NOTE — Progress Notes (Signed)
1:1 Nursing Note  D. Pt has been up and was assisted to shower and wash hair by MHTs.  Pt appeared pleasant and joking with staff.  Pt took HS medications without issue.  She asked for Gatorade and was given this.  No complaints voiced.  A.  1:1 continued as ordered for Pt safety.  R.  Pt remains safe on the unit.

## 2020-09-28 NOTE — Progress Notes (Signed)
Nursing 1:1 note  D.  Pt resting in bed with eyes closed, no distress noted.  Pt required two assist to sit up to take medication and held it in her mouth for some time before being able to swallow it.  Blood sugar 115 this AM.  Pt denied need to toilet at this time, did not appear to be wet.    A.  1:1 continued as ordered for Pt safety  R.  Pt remains safe on the unit.

## 2020-09-28 NOTE — Progress Notes (Signed)
Writer asking the patient to help with getting her dry and to the bathroom. Patient refused. Duwayne Heck the  tech came to help assist. Writer was able to wipe the bed with wipes and wipe  the pillows to clean the bed. The solid lien was placed in the dirty hamper.

## 2020-09-28 NOTE — Progress Notes (Addendum)
[facility]  09/28/2020 7:33 AM Arther DamesSusan B Danzy  MRN:  [id number]   Chief Complaint: depression and SI  Subjective:  Arther DamesSusan B Racz is a 70 y.o. female with a past psychiatric history significant for bipolar d/o and anxiety, who was initially admitted for inpatient psychiatric hospitalization on 09/19/2020 for management of worsening depression and SI. The patient is currently on Hospital Day 9.   Chart Review from last 24 hours:  The patient's chart was reviewed and nursing notes were reviewed. The patient's case was discussed in multidisciplinary team meeting. Per nursing, patient remained on 1:1 and had intermittent urinary incontinence yesterday. She toileted and showered with assistance and did walk some in the hall with gait belt and walker. She did not attend groups. No behavioral issues or SI/HI noted. Per MAR, she was compliant with scheduled medications and required no PRNs for agitation or anxiety.  Information Obtained Today During Patient Interview: The patient was seen and evaluated on the unit with 1:1 present. She is requiring assistance with ambulation to the bathroom and general prompting to address ADLs. She admits she has had increased urinary urgency and frequency and I advised that she appears to have a likely UTI. She is in agreement to start an antibiotic while a urine culture is pending. She states she feels "a little depressed" but denies anxiety. She states she slept well and has a good appetite. She claims to have moved her bowels yesterday but according to sitter no BM has been documented. She admits she is a little constipated and agrees to start of stool softener. She denies medication side-effects. She denies SI, HI, AVH, paranoia, first rank symptoms, or ideas of reference.    Principal Problem: Bipolar depression (HCC) Diagnosis: Principal Problem:   Bipolar depression (HCC) Active Problems:   HLD (hyperlipidemia)   Hypothyroidism   Diabetes (HCC)   Major  neurocognitive disorder (HCC)  Total Time Spent in Direct Patient Care:  I personally spent 30 minutes on the unit in direct patient care. The direct patient care time included face-to-face time with the patient, reviewing the patient's chart, communicating with other professionals, and coordinating care. Greater than 50% of this time was spent in counseling or coordinating care with the patient regarding goals of hospitalization, psycho-education, and discharge planning needs.  Past Psychiatric History: see admission H&P  Past Medical History:  Past Medical History:  Diagnosis Date  . Anxiety   . Bipolar 1 disorder (HCC)   . Bradycardia   . Depression   . Diabetes mellitus, type II (HCC)    Patient takes Glucotrol and Januvia  . Hypothyroidism   . Lithium toxicity     Past Surgical History:  Procedure Laterality Date  . ABDOMINAL HYSTERECTOMY    . CHOLECYSTECTOMY     Family History:  Family History  Problem Relation Age of Onset  . Dementia Mother   . Arthritis Father    Family Psychiatric  History: see admission H&P  Social History:  Social History   Substance and Sexual Activity  Alcohol Use No  . Alcohol/week: 0.0 standard drinks     Social History   Substance and Sexual Activity  Drug Use No    Social History   Socioeconomic History  . Marital status: Single    Spouse name: Not on file  . Number of children: Not on file  . Years of education: Not on file  . Highest education level: Not on file  Occupational History  . Not on  file  Tobacco Use  . Smoking status: Current Every Day Smoker    Packs/day: 0.50    Years: 20.00    Pack years: 10.00    Types: Cigarettes    Start date: 11/06/1984  . Smokeless tobacco: Never Used  Vaping Use  . Vaping Use: Never used  Substance and Sexual Activity  . Alcohol use: No    Alcohol/week: 0.0 standard drinks  . Drug use: No  . Sexual activity: Not Currently  Other Topics Concern  . Not on file  Social History  Narrative  . Not on file   Social Determinants of Health   Financial Resource Strain: Not on file  Food Insecurity: Not on file  Transportation Needs: Not on file  Physical Activity: Not on file  Stress: Not on file  Social Connections: Not on file   Sleep: good  Appetite:  good  Current Medications: Current Facility-Administered Medications  Medication Dose Route Frequency Provider Last Rate Last Admin  . acetaminophen (TYLENOL) tablet 650 mg  650 mg Oral Q6H PRN Clapacs, Jackquline Denmark, MD   650 mg at 09/27/20 2104  . alum & mag hydroxide-simeth (MAALOX/MYLANTA) 200-200-20 MG/5ML suspension 30 mL  30 mL Oral Q4H PRN Clapacs, John T, MD      . aspirin EC tablet 81 mg  81 mg Oral Daily Clapacs, Jackquline Denmark, MD   81 mg at 09/27/20 0926  . benztropine (COGENTIN) tablet 0.5 mg  0.5 mg Oral BID Mason Jim, Faraz Ponciano E, MD   0.5 mg at 09/27/20 1705  . buPROPion (WELLBUTRIN XL) 24 hr tablet 150 mg  150 mg Oral Daily Bartholomew Crews E, MD   150 mg at 09/27/20 0927  . diphenoxylate-atropine (LOMOTIL) 2.5-0.025 MG per tablet 2 tablet  2 tablet Oral QID PRN Antonieta Pert, MD      . divalproex (DEPAKOTE) DR tablet 500 mg  500 mg Oral QPM Mason Jim, Bryor Rami E, MD   500 mg at 09/27/20 2101  . fenofibrate tablet 160 mg  160 mg Oral Daily Clapacs, Jackquline Denmark, MD   160 mg at 09/27/20 0927  . levothyroxine (SYNTHROID) tablet 175 mcg  175 mcg Oral Q0600 Comer Locket, MD   175 mcg at 09/28/20 0554  . loratadine (CLARITIN) tablet 10 mg  10 mg Oral Daily Clapacs, Jackquline Denmark, MD   10 mg at 09/27/20 0926  . magnesium hydroxide (MILK OF MAGNESIA) suspension 30 mL  30 mL Oral Daily PRN Clapacs, John T, MD      . metFORMIN (GLUCOPHAGE) tablet 500 mg  500 mg Oral Q breakfast Clapacs, John T, MD   500 mg at 09/27/20 0927  . nicotine (NICODERM CQ - dosed in mg/24 hours) patch 14 mg  14 mg Transdermal Daily Antonieta Pert, MD   14 mg at 09/27/20 0928  . nystatin (MYCOSTATIN/NYSTOP) topical powder   Topical TID Laveda Abbe,  NP   Given at 09/27/20 1705  . risperiDONE (RISPERDAL) tablet 4 mg  4 mg Oral QHS Comer Locket, MD   4 mg at 09/27/20 2101  . rosuvastatin (CRESTOR) tablet 40 mg  40 mg Oral Daily Comer Locket, MD   40 mg at 09/27/20 0928  . traZODone (DESYREL) tablet 100 mg  100 mg Oral QHS Bartholomew Crews E, MD   100 mg at 09/27/20 2102    Lab Results:  Results for orders placed or performed during the hospital encounter of 09/19/20 (from the past 48 hour(s))  Glucose, capillary  Status: Abnormal   Collection Time: 09/26/20 12:10 PM  Result Value Ref Range   Glucose-Capillary 110 (H) 70 - 99 mg/dL    Comment: Glucose reference range applies only to samples taken after fasting for at least 8 hours.  Glucose, capillary     Status: None   Collection Time: 09/27/20 12:07 PM  Result Value Ref Range   Glucose-Capillary 91 70 - 99 mg/dL    Comment: Glucose reference range applies only to samples taken after fasting for at least 8 hours.  Urinalysis, Complete w Microscopic Urine, Clean Catch     Status: Abnormal   Collection Time: 09/27/20  1:34 PM  Result Value Ref Range   Color, Urine YELLOW YELLOW   APPearance HAZY (A) CLEAR   Specific Gravity, Urine 1.012 1.005 - 1.030   pH 5.0 5.0 - 8.0   Glucose, UA NEGATIVE NEGATIVE mg/dL   Hgb urine dipstick NEGATIVE NEGATIVE   Bilirubin Urine NEGATIVE NEGATIVE   Ketones, ur NEGATIVE NEGATIVE mg/dL   Protein, ur NEGATIVE NEGATIVE mg/dL   Nitrite POSITIVE (A) NEGATIVE   Leukocytes,Ua LARGE (A) NEGATIVE   RBC / HPF 0-5 0 - 5 RBC/hpf   WBC, UA >50 (H) 0 - 5 WBC/hpf   Bacteria, UA RARE (A) NONE SEEN   Squamous Epithelial / LPF 0-5 0 - 5   Mucus PRESENT     Comment: Performed at [facility], [address], [place], [place] [address]  Glucose, capillary     Status: Abnormal   Collection Time: 09/27/20  4:35 PM  Result Value Ref Range   Glucose-Capillary 174 (H) 70 - 99 mg/dL    Comment: Glucose reference range applies only to  samples taken after fasting for at least 8 hours.  Valproic acid level     Status: Abnormal   Collection Time: 09/27/20  5:58 PM  Result Value Ref Range   Valproic Acid Lvl 16 (L) 50.0 - 100.0 ug/mL    Comment: Performed at [facility], [address], [place], [place] [address]  Glucose, capillary     Status: Abnormal   Collection Time: 09/28/20  5:59 AM  Result Value Ref Range   Glucose-Capillary 115 (H) 70 - 99 mg/dL    Comment: Glucose reference range applies only to samples taken after fasting for at least 8 hours.   Comment 1 Notify RN    Comment 2 Document in Chart     Blood Alcohol level:  Lab Results  Component Value Date   ETH <10 09/16/2020   ETH <10 12/03/2019    Metabolic Disorder Labs: Lab Results  Component Value Date   HGBA1C 6.5 (H) 09/16/2020   MPG 139.85 09/16/2020   MPG 128.37 12/28/2018   Lab Results  Component Value Date   PROLACTIN 15.1 07/03/2015   Lab Results  Component Value Date   CHOL 106 09/16/2020   TRIG 315 (H) 09/16/2020   HDL 28 (L) 09/16/2020   CHOLHDL 3.8 09/16/2020   VLDL 63 (H) 09/16/2020   LDLCALC 15 09/16/2020   LDLCALC 38 12/28/2018    Physical Findings: AIMS: Facial and Oral Movements Muscles of Facial Expression: None, normal Lips and Perioral Area: None, normal Jaw: None, normal Tongue: None, normal,Extremity Movements Upper (arms, wrists, hands, fingers): None, normal Lower (legs, knees, ankles, toes): None, normal, Trunk Movements Neck, shoulders, hips: None, normal, Overall Severity Severity of abnormal movements (highest score from questions above): None, normal Incapacitation due to abnormal movements: None, normal Patient's awareness  of abnormal movements (rate only patient's report): No Awareness, Dental Status Current problems with teeth and/or dentures?: No Does patient usually wear dentures?: No      Musculoskeletal: Strength & Muscle Tone: within normal limits Gait & Station:  ambulates with walker - normal station Patient leans: N/A  Psychiatric Specialty Exam: Physical Exam Vitals reviewed.  HENT:     Head: Normocephalic.  Pulmonary:     Effort: Pulmonary effort is normal.  Neurological:     Mental Status: She is alert.     Review of Systems  Respiratory: Negative for shortness of breath.   Cardiovascular: Negative for chest pain.  Gastrointestinal: Positive for constipation. Negative for abdominal pain, diarrhea, nausea and vomiting.    BP 135/70 HR 53 RR 16 sats 98% RA, temp 97.8  General Appearance: obese, casually dressed with fair hygiene  Eye Contact:  Fair  Speech:  Mumbling quality at times, regular rate  Volume:  Normal  Mood:  Aloof, ambivalent  Affect:  Mildly constricted  Thought Process:  Concrete  Orientation:  Oriented to month, year, city  Thought Content:  Denies AVH, paranoia, ideas of reference, or first rank symptoms; not acutely responding to internal/external stimuli on exam  Suicidal Thoughts:  No  Homicidal Thoughts:  No  Memory: Poor  Judgement:  Limited  Insight:  Lacking  Psychomotor Activity:  Decreased   Concentration:  Concentration: Fair  Recall:  Poor  Fund of Knowledge:  Limited   Language:  Fair  Akathisia:  Negative  Assets:  Desire for Improvement Resilience  ADL's:  Needs prompts and assistance  Sleep:  6.75 hours   Treatment Plan Summary: Diagnoses / Active Problems: Bipolar d/o MRE depressed Major Neurocognitive impairment - likely multifactorial Type II DM Hypothyroidism Hypertriglyceridemia Tobacco Use d/o  PLAN: 1. Safety and Monitoring:  -- Voluntary admission to inpatient psychiatric unit for safety, stabilization and treatment  -- Daily contact with patient to assess and evaluate symptoms and progress in treatment  -- Patient's case to be discussed in multi-disciplinary team meeting  -- Observation Level : q15 minute checks  -- Vital signs:  q12 hours  -- Precautions: suicide  2.  Psychiatric Diagnoses and Treatment:   Bipolar d/o MRE depressed  Major cognitive impairment - likely multifactorial  -- Patient on 1:1 for confusion and fall risk  -- MOCA 10/30 on 09/26/20  -- OT evaluated patient on 09/26/20 and she scored 6 on her SLUMS evaluation with recommendations that patient is not appropriate to return home at this time and live independently d/t safety risk, cognitive impairments, and inability to safely engage in ADL/iADLs.  -- Per social work patient scored 20/30 on her mini competency exam done by APS and a decision will be made in the next 30 days about potential guardianship-SW looking into ALF referral with FL2 completed  -- While in the ED on 09/25/20 patient had Head CT noncontrast and CT cervical spine which were negative and Ammonia level 19, CMP WNL except for glucose 100, and CBC with WBC 7.8, H/H 15.2/47.4, platelets 160 -- Continue Cogentin 0.5mg  bid for side effects of medication -- Monitor for agitation/psychosis with dose reduction in Risperdal to 4mg  po qhs  -- Attempting to contact her outpatient provider, Dr. [phone]) with her signed consent for collateral and better understanding of her previous medication regimen and clinical baseline  -- Increase VPA to 750mg  ER every evening for mood stability and nightly agitation (AST 19, ALT 21, WBC 7.8, H/H 15.2/47.4, platelets  160; VPA level on 09/27/20 is 16)   -- Continue Trazodone  po qhs PRN for insomnia while on 1:1 monitoring   -- Continue Wellbutrin XL  daily for depression   -- Metabolic profile and EKG monitoring obtained while on an atypical antipsychotic (BMI:44.02;  Lipid Panel: cholesterol 106, triglycerides 315, HDL 28, LDL 15; HbgA1c: 6.5; QTc:47ms)   -- Encouraged patient to participate in unit milieu and in scheduled group therapies   -- Short Term Goals: Ability to identify changes in lifestyle to reduce recurrence of condition will improve and Ability to identify and  develop effective coping behaviors will improve  -- Long Term Goals: Improvement in symptoms so as ready for discharge   3. Medical Issues Being Addressed:   Fall Risk  -- continue 1:1 with walker and wheelchair and reconsult PT   Constipation  -- Start on Colace  daily and Miralax 17g daily  -- Encourage ambulation and po hydration   UTI with urinary incontinence  -- Ordered clean catch UA given her recent confusion and urinary incontinence - UA shows positive nitrites, large leukocytes, >50 WBC and rare bacteria - will send for urine culture and start Macrobid  bid  -- Start Macrobid  bid and pending urine culture   Hypertriglyceridemia  -- Continue Fenofibrate  daily  -- Continue Crestor  daily    Type II DM  -- Discontinued Glipizide given recent blood sugar trends  -- Continue Glucophage  daily  -- Checking FSBS tid (recent blood sugars 115, 174, 91)   Hypothyroidism  -- Continue Synthroid daily  -- TSH 1.116   Seasonal Allergies  -- Continue Claritin  daily   Tobacco Use d/o  -- Smoking cessation encouraged  -- Continue Nicoderm patch /24 hours   Age Indeterminate septal infarct on EKG  -- Patient will remain on ASA  daily  -- Patient currently asymptomatic - spoke with Dr. Eden Emms, on call for cardiology, who feels was likely lead placement and he recommends f/u with PCP as an outpatient - no further inpatient w/u needed at this time  -- Holding restart of Cozaar since patient has not been compliant with med prior to admission and monitoring blood pressures   High Risk Medication Use: -- The complexity of this patient's case involves drug therapy with Depakote which requires intensive monitoring for toxicity. This is in part due to a narrow therapeutic window as well as potential for toxicity with this medication.   4. Discharge Planning:   -- Social work and case management to assist with discharge planning and  identification of hospital follow-up needs prior to discharge - APS referral pending and asked patient to consider ALF referral  -- Estimated LOS: TBD  -- Discharge Concerns: Need to establish a safety plan; Medication compliance and effectiveness  -- Discharge Goals: Return home with outpatient referrals for mental health follow-up including medication management/psychotherapy  Comer Locket, MD, FAPA 09/28/2020, 7:33 AM

## 2020-09-28 NOTE — Progress Notes (Signed)
   09/28/20 0001  Psych Admission Type (Psych Patients Only)  Admission Status Voluntary  Psychosocial Assessment  Patient Complaints None  Eye Contact Fair  Facial Expression Flat  Affect Appropriate to circumstance  Speech Logical/coherent;Slow  Interaction Assertive  Motor Activity Unsteady;Slow  Appearance/Hygiene Disheveled  Behavior Characteristics Appropriate to situation  Mood Pleasant  Thought Process  Coherency Concrete thinking  Content WDL  Delusions None reported or observed  Perception WDL  Hallucination None reported or observed  Judgment Impaired  Confusion Moderate  Danger to Self  Current suicidal ideation? Denies  Danger to Others  Danger to Others None reported or observed

## 2020-09-28 NOTE — Plan of Care (Signed)
  Problem: Education: Goal: Mental status will improve Outcome: Progressing Goal: Verbalization of understanding the information provided will improve Outcome: Progressing   Problem: Coping: Goal: Ability to demonstrate self-control will improve Outcome: Progressing   Problem: Health Behavior/Discharge Planning: Goal: Identification of resources available to assist in meeting health care needs will improve Outcome: Progressing

## 2020-09-28 NOTE — Progress Notes (Signed)
1:1 note  Pt has been resting most of the afternoon. Pt did ambulate to make phone calls. Pt provided medication for constipation. Pt has been animated while awake. Pt safe on the unit. q34m safety checks implemented and continued. 1:1 continues for safety. Sitter within arms reach of pt. Gait belt present when ambulating. Will continue to monitor.

## 2020-09-29 DIAGNOSIS — F319 Bipolar disorder, unspecified: Secondary | ICD-10-CM | POA: Diagnosis not present

## 2020-09-29 LAB — GLUCOSE, CAPILLARY
Glucose-Capillary: 107 mg/dL — ABNORMAL HIGH (ref 70–99)
Glucose-Capillary: 148 mg/dL — ABNORMAL HIGH (ref 70–99)
Glucose-Capillary: 117 mg/dL — ABNORMAL HIGH (ref 70–99)

## 2020-09-29 NOTE — [facility] Group Notes (Signed)
Patient did not attend group   ADULT GRIEF GROUP NOTE:   Spiritual care group on grief and loss facilitated by chaplain Katy Michaelene Dutan, [facility]   Group Goal:   Support / Education around grief and loss   Members engage in facilitated group support and psycho-social education.   Group Description:   Following introductions and group rules, group members engaged in facilitated group dialog and support around topic of loss, with particular support around experiences of loss in their lives. Group Identified types of loss (relationships / self / things) and identified patterns, circumstances, and changes that precipitate losses. Reflected on thoughts / feelings around loss, normalized grief responses, and recognized variety in grief experience. Group noted Worden's four tasks of grief in discussion.   Group drew on Adlerian / Rogerian, narrative, MI,    

## 2020-09-29 NOTE — [facility] Group Notes (Addendum)
Type of Therapy and Topic:Group Therapy: Effective Communication  Participation Level:Attended  Description of Group:  In this group patients will be asked to identify their own styles of communication as well as defining and identifying passive, assertive, and aggressive styles of communication. Participants will identify strategies to communicate in a more assertive manner in an effort to appropriately meet their needs. This group will be process-oriented, with patients participating in exploration of their own experiences as well as giving and receiving support and challenge from other group members.  Therapeutic Goals: 1. Patient will identify their personal communication style. 2. Patient will identify passive, assertive, and aggressive forms of communication. 3. Patient will identify strategies for developing more effective communication to appropriately meet their needs.   Summary of Patient Progress: CSW provided Jaliya with a packet of worksheets and encouraged pt to asks questions 1:1 if any presented.   Therapeutic Modalities:  Communication Skills Solution Focused Therapy Motivational Interviewing

## 2020-09-29 NOTE — BHH Counselor (Signed)
CSW spoke with Nehemiah Settle [phone] who states that [name] was doing better when she was at home and did not need a walker or extra assistance at home.  Mrs. Coralee North states that [name] was having some difficulties using the bathroom before coming to the hospital but "down played" these issues to friends and family.  Mrs. Coralee North states that she is thinking about seeking guardianship over [name] to help with the decisions about her health.  Mrs. Coralee North states that she has spoken with [name]'s sisters and brother and states that there are no family members willing to help her.  Mrs. Coralee North states that she will continue to think about applying for this and would like to be kept up to date about the states decisions from APS, [facility], and any skilled or assisted nursing facilities.  CSW stated that she would let Mrs. Coralee North know about what decisions are made and what care will be best suited for [name]. Mrs. Coralee North also asked if [name] would be able to attend her grandson's graduation from high school on June 8th, 2022.  CSW explained that she was not sure where [name] will be on that date or if she will still be in the hospital.  CSW informed Mrs. Coralee North that she would let her know about this as it got closer to time.

## 2020-09-29 NOTE — Progress Notes (Signed)
[facility] MD Progress Note  09/29/2020 7:10 AM Julie Morrow  MRN:  [id number]   Chief Complaint: depression and SI  Subjective:  Julie Morrow is a 70 y.o. female with a past psychiatric history significant for bipolar d/o and anxiety, who was initially admitted for inpatient psychiatric hospitalization on 09/19/2020 for management of worsening depression and SI. The patient is currently on Hospital Day 10.   Chart Review from last 24 hours:  The patient's chart was reviewed and nursing notes were reviewed. The patient's case was discussed in multidisciplinary team meeting. Per nursing, patient was assisted with bathing and did have urinary incontinence. She did ambulate with assistance yesterday and was out of bed to chair with prompts. She continues with 1:1. No behavioral issues noted. Per [facility] she was compliant with scheduled medications and did not require PRNs for agitation or safety.   Information Obtained Today During Patient Interview: The patient was seen and evaluated on the unit with 1:1 present. She states she is sleeping well, and I encouraged her to be out of the bed more. She was encouraged to ambulate with her walker and to attend groups or sit up in the dayroom. She states her mood is "okay," and she voices no physical complaints. She states she is still constipated but did more her bowels some yesterday. I suggested she try a dose of MOM today and advised that increased ambulation and increase fluid intake would help her constipation. She denies AVH, paranoia, first rank symptoms, or ideas of reference today. She denies medication side-effects. She continues to have confusion on exam and is unable to tell me the day of the week, the upcoming or most recent holiday, or the reason for her admission. She is oriented to month, year, and self. She is requiring prompts to attend to ADLs and is requiring assistance with toileting and ambulation.   Principal Problem: Bipolar depression  (HCC) Diagnosis: Principal Problem:   Bipolar depression (HCC) Active Problems:   HLD (hyperlipidemia)   Hypothyroidism   Diabetes (HCC)   Major neurocognitive disorder (HCC)  Total Time Spent in Direct Patient Care:  I personally spent 30 minutes on the unit in direct patient care. The direct patient care time included face-to-face time with the patient, reviewing the patient's chart, communicating with other professionals, and coordinating care. Greater than 50% of this time was spent in counseling or coordinating care with the patient regarding goals of hospitalization, psycho-education, and discharge planning needs.  Past Psychiatric History: see admission H&P  Past Medical History:  Past Medical History:  Diagnosis Date  . Anxiety   . Bipolar 1 disorder (HCC)   . Bradycardia   . Depression   . Diabetes mellitus, type II (HCC)    Patient takes Glucotrol and Januvia  . Hypothyroidism   . Lithium toxicity     Past Surgical History:  Procedure Laterality Date  . ABDOMINAL HYSTERECTOMY    . CHOLECYSTECTOMY     Family History:  Family History  Problem Relation Age of Onset  . Dementia Mother   . Arthritis Father    Family Psychiatric  History: see admission H&P  Social History:  Social History   Substance and Sexual Activity  Alcohol Use No  . Alcohol/week: 0.0 standard drinks     Social History   Substance and Sexual Activity  Drug Use No    Social History   Socioeconomic History  . Marital status: Single    Spouse name: Not on file  .  Number of children: Not on file  . Years of education: Not on file  . Highest education level: Not on file  Occupational History  . Not on file  Tobacco Use  . Smoking status: Current Every Day Smoker    Packs/day: 0.50    Years: 20.00    Pack years: 10.00    Types: Cigarettes    Start date: 11/06/1984  . Smokeless tobacco: Never Used  Vaping Use  . Vaping Use: Never used  Substance and Sexual Activity  . Alcohol  use: No    Alcohol/week: 0.0 standard drinks  . Drug use: No  . Sexual activity: Not Currently  Other Topics Concern  . Not on file  Social History Narrative  . Not on file   Social Determinants of Health   Financial Resource Strain: Not on file  Food Insecurity: Not on file  Transportation Needs: Not on file  Physical Activity: Not on file  Stress: Not on file  Social Connections: Not on file   Sleep: good  Appetite:  good  Current Medications: Current Facility-Administered Medications  Medication Dose Route Frequency Provider Last Rate Last Admin  . acetaminophen (TYLENOL) tablet 650 mg  650 mg Oral Q6H PRN Clapacs, Jackquline DenmarkJohn T, MD   650 mg at 09/28/20 2054  . alum & mag hydroxide-simeth (MAALOX/MYLANTA) 200-200-20 MG/5ML suspension 30 mL  30 mL Oral Q4H PRN Clapacs, John T, MD      . aspirin EC tablet 81 mg  81 mg Oral Daily Clapacs, Jackquline DenmarkJohn T, MD   81 mg at 09/28/20 1033  . benztropine (COGENTIN) tablet 0.5 mg  0.5 mg Oral BID Mason JimSingleton, Ilaisaane Marts E, MD   0.5 mg at 09/28/20 2130  . buPROPion (WELLBUTRIN XL) 24 hr tablet 150 mg  150 mg Oral Daily Mason JimSingleton, Teressa Mcglocklin E, MD   150 mg at 09/28/20 1033  . divalproex (DEPAKOTE ER) 24 hr tablet 750 mg  750 mg Oral QHS Bartholomew CrewsSingleton, Marlen Mollica E, MD   750 mg at 09/28/20 2053  . docusate sodium (COLACE) capsule 100 mg  100 mg Oral Daily Mason JimSingleton, Keelie Zemanek E, MD   100 mg at 09/28/20 1436  . fenofibrate tablet 160 mg  160 mg Oral Daily Clapacs, Jackquline DenmarkJohn T, MD   160 mg at 09/28/20 1033  . levothyroxine (SYNTHROID) tablet 175 mcg  175 mcg Oral Q0600 Comer LocketSingleton, Denielle Bayard E, MD   175 mcg at 09/28/20 0554  . loratadine (CLARITIN) tablet 10 mg  10 mg Oral Daily Clapacs, Jackquline DenmarkJohn T, MD   10 mg at 09/28/20 1033  . magnesium hydroxide (MILK OF MAGNESIA) suspension 30 mL  30 mL Oral Daily PRN Clapacs, John T, MD      . metFORMIN (GLUCOPHAGE) tablet 500 mg  500 mg Oral Q breakfast Clapacs, John T, MD   500 mg at 09/28/20 1032  . nicotine (NICODERM CQ - dosed in mg/24 hours) patch 14 mg  14 mg  Transdermal Daily Antonieta Pertlary, Greg Lawson, MD   14 mg at 09/28/20 1033  . nitrofurantoin (macrocrystal-monohydrate) (MACROBID) capsule 100 mg  100 mg Oral Q12H Mason JimSingleton, Artemus Romanoff E, MD   100 mg at 09/28/20 2053  . nystatin (MYCOSTATIN/NYSTOP) topical powder   Topical TID Laveda AbbeParks, Laurie Britton, NP   Given at 09/28/20 2056  . polyethylene glycol (MIRALAX / GLYCOLAX) packet 17 g  17 g Oral Daily Mason JimSingleton, Clorine Swing E, MD   17 g at 09/28/20 1436  . risperiDONE (RISPERDAL) tablet 4 mg  4 mg Oral QHS Shevonne Wolf E,  MD   4 mg at 09/28/20 2054  . rosuvastatin (CRESTOR) tablet 40 mg  40 mg Oral Daily Bartholomew Crews E, MD   40 mg at 09/28/20 1033  . traZODone (DESYREL) tablet 100 mg  100 mg Oral QHS PRN Comer Locket, MD        Lab Results:  Results for orders placed or performed during the hospital encounter of 09/19/20 (from the past 48 hour(s))  Glucose, capillary     Status: None   Collection Time: 09/27/20 12:07 PM  Result Value Ref Range   Glucose-Capillary 91 70 - 99 mg/dL    Comment: Glucose reference range applies only to samples taken after fasting for at least 8 hours.  Urinalysis, Complete w Microscopic Urine, Clean Catch     Status: Abnormal   Collection Time: 09/27/20  1:34 PM  Result Value Ref Range   Color, Urine YELLOW YELLOW   APPearance HAZY (A) CLEAR   Specific Gravity, Urine 1.012 1.005 - 1.030   pH 5.0 5.0 - 8.0   Glucose, UA NEGATIVE NEGATIVE mg/dL   Hgb urine dipstick NEGATIVE NEGATIVE   Bilirubin Urine NEGATIVE NEGATIVE   Ketones, ur NEGATIVE NEGATIVE mg/dL   Protein, ur NEGATIVE NEGATIVE mg/dL   Nitrite POSITIVE (A) NEGATIVE   Leukocytes,Ua LARGE (A) NEGATIVE   RBC / HPF 0-5 0 - 5 RBC/hpf   WBC, UA >50 (H) 0 - 5 WBC/hpf   Bacteria, UA RARE (A) NONE SEEN   Squamous Epithelial / LPF 0-5 0 - 5   Mucus PRESENT     Comment: Performed at [facility], [address], [place], [place] [address]  Glucose, capillary     Status: Abnormal   Collection Time:  09/27/20  4:35 PM  Result Value Ref Range   Glucose-Capillary 174 (H) 70 - 99 mg/dL    Comment: Glucose reference range applies only to samples taken after fasting for at least 8 hours.  Valproic acid level     Status: Abnormal   Collection Time: 09/27/20  5:58 PM  Result Value Ref Range   Valproic Acid Lvl 16 (L) 50.0 - 100.0 ug/mL    Comment: Performed at [facility], [address] [place], [place] [address]  Glucose, capillary     Status: Abnormal   Collection Time: 09/28/20  5:59 AM  Result Value Ref Range   Glucose-Capillary 115 (H) 70 - 99 mg/dL    Comment: Glucose reference range applies only to samples taken after fasting for at least 8 hours.   Comment 1 Notify RN    Comment 2 Document in Chart   Glucose, capillary     Status: Abnormal   Collection Time: 09/28/20 11:38 AM  Result Value Ref Range   Glucose-Capillary 184 (H) 70 - 99 mg/dL    Comment: Glucose reference range applies only to samples taken after fasting for at least 8 hours.  Glucose, capillary     Status: Abnormal   Collection Time: 09/28/20  4:41 PM  Result Value Ref Range   Glucose-Capillary 107 (H) 70 - 99 mg/dL    Comment: Glucose reference range applies only to samples taken after fasting for at least 8 hours.  Glucose, capillary     Status: Abnormal   Collection Time: 09/29/20  6:23 AM  Result Value Ref Range   Glucose-Capillary 107 (H) 70 - 99 mg/dL    Comment: Glucose reference range applies only to samples taken after fasting for at least 8 hours.  Blood Alcohol level:  Lab Results  Component Value Date   ETH <10 09/16/2020   ETH <10 12/03/2019    Metabolic Disorder Labs: Lab Results  Component Value Date   HGBA1C 6.5 (H) 09/16/2020   MPG 139.85 09/16/2020   MPG 128.37 12/28/2018   Lab Results  Component Value Date   PROLACTIN 15.1 07/03/2015   Lab Results  Component Value Date   CHOL 106 09/16/2020   TRIG 315 (H) 09/16/2020   HDL 28 (L) 09/16/2020    CHOLHDL 3.8 09/16/2020   VLDL 63 (H) 09/16/2020   LDLCALC 15 09/16/2020   LDLCALC 38 12/28/2018    Physical Findings: AIMS: Facial and Oral Movements Muscles of Facial Expression: None, normal Lips and Perioral Area: None, normal Jaw: None, normal Tongue: None, normal,Extremity Movements Upper (arms, wrists, hands, fingers): None, normal Lower (legs, knees, ankles, toes): None, normal, Trunk Movements Neck, shoulders, hips: None, normal, Overall Severity Severity of abnormal movements (highest score from questions above): None, normal Incapacitation due to abnormal movements: None, normal Patient's awareness of abnormal movements (rate only patient's report): No Awareness, Dental Status Current problems with teeth and/or dentures?: No Does patient usually wear dentures?: No      Musculoskeletal: Strength & Muscle Tone: within normal limits Gait & Station: untested in bed today Patient leans: N/A  Psychiatric Specialty Exam: Physical Exam Vitals reviewed.  HENT:     Head: Normocephalic.  Pulmonary:     Effort: Pulmonary effort is normal.  Neurological:     Mental Status: She is alert.     Review of Systems  Respiratory: Negative for shortness of breath.   Cardiovascular: Negative for chest pain.  Gastrointestinal: Positive for constipation. Negative for abdominal pain, diarrhea, nausea and vomiting.    BP 111/50 HR 55, Temp 98.3   General Appearance: obese, casually dressed with fair hygiene  Eye Contact:  Fair  Speech:  Mumbling quality at times, regular rate  Volume:  soft  Mood:  Aloof, ambivalent  Affect:  Can smile at times on exam with brighter affect  Thought Process:  Concrete  Orientation:  Oriented to month, year,self - does not know day of week or recent or upcoming holiday  Thought Content:  Denies AVH, paranoia, ideas of reference, or first rank symptoms; not acutely responding to internal/external stimuli on exam  Suicidal Thoughts:  No  Homicidal  Thoughts:  No  Memory: Poor  Judgement:  Limited  Insight:  Lacking  Psychomotor Activity:  Decreased - would not sit up to cooperate for AIMS testing today; no tremor at rest  Concentration:  Concentration: Fair  Recall:  Poor  Fund of Knowledge:  Limited   Language:  Fair  Akathisia:  Negative  Assets:  Desire for Improvement Resilience  ADL's:  Needs prompts and assistance  Sleep:  6.75 hours   Treatment Plan Summary: Diagnoses / Active Problems: Bipolar d/o MRE depressed Major Neurocognitive impairment - likely multifactorial Type II DM Hypothyroidism Hypertriglyceridemia Tobacco Use d/o  PLAN: 1. Safety and Monitoring:  -- Voluntary admission to inpatient psychiatric unit for safety, stabilization and treatment  -- Daily contact with patient to assess and evaluate symptoms and progress in treatment  -- Patient's case to be discussed in multi-disciplinary team meeting  -- Observation Level : q15 minute checks  -- Vital signs:  q12 hours  -- Precautions: suicide  2. Psychiatric Diagnoses and Treatment:   Bipolar d/o MRE depressed  Major cognitive impairment - likely multifactorial  -- Patient on 1:1 for  confusion and fall risk  -- MOCA 10/30 on 09/26/20  -- OT evaluated patient on 09/26/20 and she scored 6 on her SLUMS evaluation with recommendations that patient is not appropriate to return home at this time and live independently d/t safety risk, cognitive impairments, and inability to safely engage in ADL/iADLs.  -- Per social work patient scored 20/30 on her mini competency exam done by APS and a decision will be made in the next 30 days about potential guardianship-SW looking into ALF referral with FL2 completed  -- While in the ED on 09/25/20 patient had Head CT noncontrast and CT cervical spine which were negative and Ammonia level 19, CMP WNL except for glucose 100, and CBC with WBC 7.8, H/H 15.2/47.4, platelets 160 -- Continue Cogentin 0.5mg  bid for side effects of  medication -- Monitor for agitation/psychosis with dose reduction in Risperdal to 4mg  po qhs  -- Attempted to contact her outpatient provider, Dr. [phone] with her signed consent for collateral and better understanding of her previous medication regimen and clinical baseline - I have called the practice twice and cannot get an answer on nursing line  -- Continue VPA 750mg  ER every evening for mood stability (AST 19, ALT 21, WBC 7.8, H/H 15.2/47.4, platelets 160; VPA level on 09/27/20 is 16) Rechecking VPA level, CBC, and LFTs on Thursday  -- Continue Trazodone 100mg  po qhs PRN for insomnia while on 1:1 monitoring   -- Continue Wellbutrin XL 150mg  daily for depression   -- Metabolic profile and EKG monitoring obtained while on an atypical antipsychotic (BMI:44.02;  Lipid Panel: cholesterol 106, triglycerides 315, HDL 28, LDL 15; HbgA1c: 6.5; QTc:462ms)   -- Encouraged patient to participate in unit milieu and in scheduled group therapies   -- Short Term Goals: Ability to identify changes in lifestyle to reduce recurrence of condition will improve and Ability to identify and develop effective coping behaviors will improve  -- Long Term Goals: Improvement in symptoms so as ready for discharge   3. Medical Issues Being Addressed:   Fall Risk  -- continue 1:1 with walker and wheelchair and reconsult PT for help with getting patient out of bed and ambulating on the unit   Constipation  -- Start on Colace 100mg  daily and Miralax 17g daily; giving MOM X1 today  -- Encourage ambulation and po hydration   UTI with urinary incontinence  -- Ordered clean catch UA given her recent confusion and urinary incontinence - UA shows positive nitrites, large leukocytes, >50 WBC and rare bacteria - will send for urine culture  -- Start Macrobid 100mg  bid and pending urine culture   Hypertriglyceridemia  -- Continue Fenofibrate 160mg  daily  -- Continue Crestor 40mg  daily    Type II DM  --  Discontinued Glipizide given recent blood sugar trends  -- Continue Glucophage 500mg  daily  -- Checking FSBS tid (recent blood sugars 107, 107,184)   Hypothyroidism  -- Continue Synthroid Friday daily  -- TSH 1.116   Seasonal Allergies  -- Continue Claritin 10mg  daily   Tobacco Use d/o  -- Smoking cessation encouraged  -- Continue Nicoderm patch 14mg /24 hours   Age Indeterminate septal infarct on EKG  -- Patient will remain on ASA 81mg  daily  -- Patient currently asymptomatic - spoke with Dr. , on call for cardiology, who feels was likely lead placement and he recommends f/u with PCP as an outpatient - no further inpatient w/u needed at this time  -- Holding restart of Cozaar since patient  has not been compliant with med prior to admission and monitoring blood pressures   High Risk Medication Use: -- The complexity of this patient's case involves drug therapy with Depakote which requires intensive monitoring for toxicity. This is in part due to a narrow therapeutic window as well as potential for toxicity with this medication.   4. Discharge Planning:   -- Social work and case management to assist with discharge planning and identification of hospital follow-up needs prior to discharge - APS referral pending and asked patient to consider ALF referral  -- Estimated LOS: TBD  -- Discharge Concerns: Need to establish a safety plan; Medication compliance and effectiveness  -- Discharge Goals: Return home with outpatient referrals for mental health follow-up including medication management/psychotherapy  Comer Locket, MD, FAPA 09/29/2020, 7:10 AM

## 2020-09-29 NOTE — Progress Notes (Signed)
Patient ate 25 % of her food. Staff tried to encourage Patient to eat a little more food. Patient refused.

## 2020-09-29 NOTE — Progress Notes (Signed)
   09/29/20 0921  Vital Signs  Temp 98.3 F (36.8 C)  Temp Source Oral  Pulse Rate (!) 55  BP (!) 111/50  BP Method Automatic    09/29/20 0921  Vital Signs  Temp 98.3 F (36.8 C)  Temp Source Oral  Pulse Rate (!) 55  BP (!) 111/50  BP Method Automatic   D: Pt. Denies SI/HI/AVH. Pt. Denies both anxiety and depression. Pt. Was assisted to her knees while walking. PT. Was unsteady on her feet and was a 3 person assist and a 4 person assist to get her back in her chair. Pt. Was toilet x2. Pt. Had some fecal smear. Pt. Walked x2 with staff and once with physical therapy. A:  Patient took scheduled medicine.  Support and encouragement provided Routine safety checks conducted every 15 minutes. Patient informed to notify staff with any concerns.   R: Safety maintained.

## 2020-09-29 NOTE — [facility] Group Notes (Signed)
Occupational Therapy Group Note Date: 09/29/2020 Group Topic/Focus: Stress Management  Group Description: Group encouraged increased participation and engagement through discussion focused on topic of stress management. Patients engaged interactively to discuss components of stress including physical signs, emotional signs, negative management strategies, and positive management strategies. Each individual identified one new stress management strategy they would like to try moving forward.    Therapeutic Goals: Identify current stressors Identify healthy vs unhealthy stress management strategies/techniques Discuss and identify physical and emotional signs of stress Participation Level: Patient did not attend OT group session despite personal invitation. Pt was asleep in bedroom when OT attempted to invite.  Plan: Continue to engage patient in OT groups 2 - 3x/week.  09/29/2020  Donne Hazel, MOT, OTR/L

## 2020-09-29 NOTE — Progress Notes (Signed)
1:1 Nursing Note  D.  Pt has got up to bathroom with two assist and urinated on her pants.  Underwear and pants changed, Pt clean appropriately.  Pt tolerated well.  Pt's blood sugar was 107 this AM.    A.  1:1 continued as ordered for Pt safety  R.   Pt remains safe on the unit.

## 2020-09-29 NOTE — [facility] Counselor (Signed)
CSW spoke with Julie Morrow about possible placement of Julie Morrow on their geriatric unit.  CSW was informed that there are currently no beds available but did verify that the paperwork was received by fax and that she will be considered for a bed when one becomes available.  The receptionist also stated that there is no official waiting list and that someone will need to call every day to check on placement and bed availability.  CSW will continue to contact Julie Morrow and check on bed availability each day that Julie Morrow is in this facility.

## 2020-09-29 NOTE — Progress Notes (Signed)
1:1 patient walked with 3 person assist x2. And toileted x2. Pt in room with MHT. 1:1 continues. Safety maintained

## 2020-09-29 NOTE — Progress Notes (Signed)
1:1 Nursing Note  D.  Pt resting in bed with eyes closed, respirations even and unlabored  A.  1:1 continued as ordered for Pt safety  R.  Pt remains safe on the unit

## 2020-09-29 NOTE — Evaluation (Signed)
Physical Therapy Evaluation Patient Details Name: Julie Morrow MRN: [id number] DOB: 08-22-1950 Today's Date: 09/29/2020   History of Present Illness  Pt is a 70 y/o female with PMHx of bipolar disorder, anxiety disorder, depression who presented to the [facility] ED 09/16/2020 and then admitted to [facility] on 09/20/2020.PTT ordered/consulted to address and assess recent decline in functional mobility. Patient was modified independnet on 09/22/20 PT eval. Now requiores 2 mod/max assist.  Clinical Impression  The patient presents with a significant decline in functional mobility since PT eval on 09/22/20 where patient was modified independent in mobility in room using RW. CT of head and neck are negative.  Patient requires max of 2 to ambulate with WC following closely as patient  May buckle at the knees per RN.  Pt admitted with above diagnosis.  Pt currently with functional limitations due to the deficits listed below (see PT Problem List). Pt will benefit from skilled PT to increase their independence and safety with mobility to allow discharge to the venue listed below.       Follow Up Recommendations SNF    Equipment Recommendations  None recommended by PT    Recommendations for Other Services       Precautions / Restrictions Precautions Precautions: Fall Precaution Comments: RN reports knees will buckle      Mobility  Bed Mobility Overal bed mobility: Needs Assistance Bed Mobility: Supine to Sit     Supine to sit: Max assist     General bed mobility comments: assist with trunk to assume upright position from supine    Transfers Overall transfer level: Needs assistance Equipment used: Rolling walker (2 wheeled) Transfers: Sit to/from [employer] Sit to Stand: Mod assist Stand pivot transfers: Mod assist       General transfer comment: assist of 2 to rise from bed, WC and Toilet. uses rail at toilet. stand and pivot to and from  WC/toilet.  Ambulation/Gait Ambulation/Gait assistance: Mod assist;+2 physical assistance;+2 safety/equipment Gait Distance (Feet): 40 Feet Assistive device: Rolling walker (2 wheeled) Gait Pattern/deviations: Step-to pattern;Shuffle;Trunk flexed Gait velocity: slow, wide base of support due to body habitus   General Gait Details: patient requiring support to steady for balance, begins to festinate in gait , rolling walker in front.  Stairs            Wheelchair Mobility    Modified Rankin (Stroke Patients Only)       Balance Overall balance assessment: Needs assistance Sitting-balance support: Bilateral upper extremity supported;Feet supported Sitting balance-Leahy Scale: Fair Sitting balance - Comments: able to lean and pull up socks     Standing balance-Leahy Scale: Poor Standing balance comment: reliant on RW                             Pertinent Vitals/Pain Pain Assessment: No/denies pain    Home Living Family/patient expects to be discharged to:: Skilled nursing facility                      Prior Function                 Hand Dominance        Extremity/Trunk Assessment   Upper Extremity Assessment Upper Extremity Assessment: Defer to OT evaluation    Lower Extremity Assessment Lower Extremity Assessment: Generalized weakness    Cervical / Trunk Assessment Cervical / Trunk Assessment: Normal  Communication      Cognition  Arousal/Alertness: Awake/alert Behavior During Therapy: WFL for tasks assessed/performed Overall Cognitive Status: Impaired/Different from baseline Area of Impairment: Orientation;Attention;Memory;Following commands;Safety/judgement;Awareness;Problem solving                 Orientation Level: Disoriented to;Time Current Attention Level: Selective Memory: Decreased short-term memory Following Commands: Follows one step commands with increased time Safety/Judgement: Decreased awareness of  safety;Decreased awareness of deficits Awareness: Intellectual Problem Solving: Slow processing;Decreased initiation;Difficulty sequencing;Requires verbal cues General Comments: multimodal cues for activities, extra tome to process      General Comments      Exercises     Assessment/Plan    PT Assessment Patient needs continued PT services  PT Problem List Decreased strength;Decreased mobility;Decreased safety awareness;Decreased knowledge of precautions;Decreased activity tolerance;Decreased cognition;Decreased balance;Decreased knowledge of use of DME       PT Treatment Interventions DME instruction;Gait training;Functional mobility training;Therapeutic activities;Therapeutic exercise;Patient/family education;Cognitive remediation    PT Goals (Current goals can be found in the Care Plan section)  Acute Rehab PT Goals PT Goal Formulation: Patient unable to participate in goal setting Time For Goal Achievement: 10/13/20 Potential to Achieve Goals: Fair    Frequency Min 1X/week   Barriers to discharge Decreased caregiver support      Co-evaluation               AM-PAC PT "6 Clicks" Mobility  Outcome Measure Help needed turning from your back to your side while in a flat bed without using bedrails?: A Little Help needed moving from lying on your back to sitting on the side of a flat bed without using bedrails?: A Lot Help needed moving to and from a bed to a chair (including a wheelchair)?: Total Help needed standing up from a chair using your arms (e.g., wheelchair or bedside chair)?: Total Help needed to walk in hospital room?: Total Help needed climbing 3-5 steps with a railing? : Total 6 Click Score: 9    End of Session Equipment Utilized During Treatment: Gait belt Activity Tolerance: Patient limited by fatigue Patient left: in chair;with nursing/sitter in room Nurse Communication: Mobility status PT Visit Diagnosis: Unsteadiness on feet (R26.81);Difficulty  in walking, not elsewhere classified (R26.2);Muscle weakness (generalized) (M62.81)    Time: 2229-7989 PT Time Calculation (min) (ACUTE ONLY): 19 min   Charges:   PT Evaluation $PT Eval Low Complexity: 1 Low          Blanchard Kelch PT Acute Rehabilitation Services Pager [phone] Office [phone]   Rada Hay 09/29/2020, 3:34 PM

## 2020-09-29 NOTE — BHH Counselor (Signed)
CSW spoke with Ms. Gauger who agreed to let the CSW speak with her brother Charleston Ropes at [phone].  Mr. [name] states that he wanted an update on how his sister is doing.  Mr. [name] states that he heard she had fallen.  CSW explained the situation to Mr. [name] and informed him that many tests had been completed and showed that Ms. Donnellan is not injured from any fall.  After being informed of Ms. Demaria's condition Mr. [name] asked CSW if the medication were causing his sister to be "sleepy" and if there was any type or surgery or medication that we could give the patient for her knees "if that is what is wrong".  CSW explained that it was not Ms. Villagran's knees and that the doctors are working on her medications each day.  Mr. [name] stated that he would call back to check on his sister again in a few days.

## 2020-09-29 NOTE — Progress Notes (Signed)
1:1 Patient walked down the hall. Pt. Was then toileted where she had 2 BMs. MHT and nurse assisting.  1:1 continues. Safety maintained.

## 2020-09-29 NOTE — Progress Notes (Signed)
   09/29/20 0921  Vital Signs  Temp 98.3 F (36.8 C)  Temp Source Oral  Pulse Rate (!) 55  BP (!) 111/50  BP Method Automatic   1:1  Patient in room with MHT. 1:1 continues. Safety maintained.

## 2020-09-29 NOTE — Progress Notes (Addendum)
Recreation Therapy Notes  Date: 5.16.22 Time: 0930 Location: [address] Dayroom  Group Topic: Stress Management   Goal Area(s) Addresses:  Patient will actively participate in stress management techniques presented during session.  Patient will successfully identify benefit of practicing stress management post d/c.   Intervention: Guided exercise with ambient sound and script  Activity :Guided Imagery  LRT read Morrow script that dealt with patients visualizing their peaceful place.  This is Morrow place where can have peace, relax and escape from the things they are dealing for Morrow few moments.  Education:  Stress Management, Discharge Planning.   Education Outcome: Acknowledges education  Clinical Observations/Feedback: Patient did not attend group session.    Julie Morrow, LRT/CTRS         Julie Morrow 09/29/2020 10:47 AM 

## 2020-09-30 DIAGNOSIS — F3160 Bipolar disorder, current episode mixed, unspecified: Secondary | ICD-10-CM | POA: Diagnosis not present

## 2020-09-30 LAB — GLUCOSE, CAPILLARY
Glucose-Capillary: 101 mg/dL — ABNORMAL HIGH (ref 70–99)
Glucose-Capillary: 130 mg/dL — ABNORMAL HIGH (ref 70–99)
Glucose-Capillary: 105 mg/dL — ABNORMAL HIGH (ref 70–99)

## 2020-09-30 MED ORDER — TRAZODONE HCL 150 MG PO TABS
150.0000 mg | ORAL_TABLET | Freq: Every evening | ORAL | Status: DC | PRN
  Administered 2020-10-01 – 2020-10-06 (×4): 150 mg via ORAL
  Filled 2020-09-30 (×4): qty 1

## 2020-09-30 NOTE — Progress Notes (Addendum)
1:1 Note:   Pt was observed eating breakfast in her bed. Pt denies SI/HI/AVH at this time. Pt was given a.m. medications without issue. Pt requires a 2 person assist for assisting with ADLs. Pt remains safe. Sitter within arms reach.

## 2020-09-30 NOTE — Progress Notes (Signed)
1:1 Nursing Note  Pt continues on a 1:1 for safety since she is a high fall risk and also requires assistance with her ADL's. Pt is currently sitting in the dayroom in her wheelchair attending group. Her sitter remains at her side at all times. Pt reports that her appetite has improved and that she is sleeping well at night. She rated her anxiety a 9/10 on a scale of 0-10 (10 being the worse). Pt was watching TV at that time. She remains unsteady on her feet. Staff has been also using her gait belt and walker for assistance with ambulation. Pt denies SI/HI and AVH. Active listening, reassurance, and support provided. Pt has her high fall risk bracelet on and her yellow non-skid socks on. Q 15 min safety checks continue. Pt's safety has been maintained.

## 2020-09-30 NOTE — Progress Notes (Signed)
1:1 Note:   Pt was observed watching TV in the dayroom. No behavioral changes. Pt remains safe. Sitter within arms reach.

## 2020-09-30 NOTE — Progress Notes (Signed)
1;1 Note, Pt remains on 1:1 observation for safety, pt is resting with eyes closed in bed, respirations are even and unlabored. Pt remains safe, will continue to monitor.

## 2020-09-30 NOTE — Progress Notes (Signed)
[facility] MD Progress Note  09/30/2020 12:58 PM Julie Morrow  MRN:  [id number] Subjective:  Patient is a 70 year old female with a reported past psychiatric history significant for bipolar disorder, anxiety disorder, depression who originally presented to the [facility] emergency department on 09/13/2020 after having been dropped off by her sister. The patient's original complaint was "I cannot take it anymore".  Objective: Patient is seen and examined.  Patient is a 70 year old female familiar to me from my last rotation.  The patient had been admitted on 5/7 after transfer from the [facility] emergency department.  She is perhaps slightly better.  She is alert and oriented today.  She still in a wheelchair.  Nursing staff reported that she is having the development of possible pressure sores because she spends so much time in the bed and in the chair.  She was wondering when she is good to get out of the hospital.  I told her that I did not believe that she would be able to care for herself, and that her family was unwilling to be able to help her out given the amount of time and effort it would take to monitor her.  Apparently Adult Protective Services is now involved and were waiting for them to assist.  I do know from personal experience that the chance of [place] being able to get her into some form of a skilled nursing facility from a psychiatric unit is almost 0.  She denied any auditory or visual hallucinations.  She denied any suicidal or homicidal ideation.  Review of recent laboratories showed her blood sugar this morning at 130.  Her laboratories from 5/12 were all essentially normal.  Her valproic acid level on 5/14 was 16.  Her urinalysis from 5/14 was also abnormal with large leukocyte esterase, positive nitrate, rare bacteria, greater than 50 white blood cells and 0-5 squamous epithelial cells.  Urine culture is still pending.  Her vital signs are stable, she  is afebrile.  Pulse oximetry on room air was 94%.  Nursing notes reflect that she slept 4 hours last night.  Principal Problem: Bipolar depression (HCC) Diagnosis: Principal Problem:   Bipolar depression (HCC) Active Problems:   HLD (hyperlipidemia)   Hypothyroidism   Diabetes (HCC)   Major neurocognitive disorder (HCC)  Total Time spent with patient: 20 minutes  Past Psychiatric History: See admission H&P  Past Medical History:  Past Medical History:  Diagnosis Date  . Anxiety   . Bipolar 1 disorder (HCC)   . Bradycardia   . Depression   . Diabetes mellitus, type II (HCC)    Patient takes Glucotrol and Januvia  . Hypothyroidism   . Lithium toxicity     Past Surgical History:  Procedure Laterality Date  . ABDOMINAL HYSTERECTOMY    . CHOLECYSTECTOMY     Family History:  Family History  Problem Relation Age of Onset  . Dementia Mother   . Arthritis Father    Family Psychiatric  History: See admission H&P Social History:  Social History   Substance and Sexual Activity  Alcohol Use No  . Alcohol/week: 0.0 standard drinks     Social History   Substance and Sexual Activity  Drug Use No    Social History   Socioeconomic History  . Marital status: Single    Spouse name: Not on file  . Number of children: Not on file  . Years of education: Not on file  . Highest education level: Not  on file  Occupational History  . Not on file  Tobacco Use  . Smoking status: Current Every Day Smoker    Packs/day: 0.50    Years: 20.00    Pack years: 10.00    Types: Cigarettes    Start date: 11/06/1984  . Smokeless tobacco: Never Used  Vaping Use  . Vaping Use: Never used  Substance and Sexual Activity  . Alcohol use: No    Alcohol/week: 0.0 standard drinks  . Drug use: No  . Sexual activity: Not Currently  Other Topics Concern  . Not on file  Social History Narrative  . Not on file   Social Determinants of Health   Financial Resource Strain: Not on file  Food  Insecurity: Not on file  Transportation Needs: Not on file  Physical Activity: Not on file  Stress: Not on file  Social Connections: Not on file   Additional Social History:                         Sleep: Poor  Appetite:  Good  Current Medications: Current Facility-Administered Medications  Medication Dose Route Frequency Provider Last Rate Last Admin  . acetaminophen (TYLENOL) tablet 650 mg  650 mg Oral Q6H PRN Clapacs, Jackquline DenmarkJohn T, MD   650 mg at 09/28/20 2054  . alum & mag hydroxide-simeth (MAALOX/MYLANTA) 200-200-20 MG/5ML suspension 30 mL  30 mL Oral Q4H PRN Clapacs, John T, MD      . aspirin EC tablet 81 mg  81 mg Oral Daily Clapacs, Jackquline DenmarkJohn T, MD   81 mg at 09/30/20 [id number]  . benztropine (COGENTIN) tablet 0.5 mg  0.5 mg Oral BID Bartholomew CrewsSingleton, Amy E, MD   0.5 mg at 09/30/20 0829  . buPROPion (WELLBUTRIN XL) 24 hr tablet 150 mg  150 mg Oral Daily Mason JimSingleton, Amy E, MD   150 mg at 09/30/20 0829  . divalproex (DEPAKOTE ER) 24 hr tablet 750 mg  750 mg Oral QHS Bartholomew CrewsSingleton, Amy E, MD   750 mg at 09/29/20 2042  . docusate sodium (COLACE) capsule 100 mg  100 mg Oral Daily Mason JimSingleton, Amy E, MD   100 mg at 09/30/20 0829  . fenofibrate tablet 160 mg  160 mg Oral Daily Clapacs, Jackquline DenmarkJohn T, MD   160 mg at 09/30/20 0829  . levothyroxine (SYNTHROID) tablet 175 mcg  175 mcg Oral Q0600 Comer LocketSingleton, Amy E, MD   175 mcg at 09/30/20 0631  . loratadine (CLARITIN) tablet 10 mg  10 mg Oral Daily Clapacs, Jackquline DenmarkJohn T, MD   10 mg at 09/30/20 0829  . magnesium hydroxide (MILK OF MAGNESIA) suspension 30 mL  30 mL Oral Daily PRN Clapacs, John T, MD      . metFORMIN (GLUCOPHAGE) tablet 500 mg  500 mg Oral Q breakfast Clapacs, Jackquline DenmarkJohn T, MD   500 mg at 09/30/20 0829  . nicotine (NICODERM CQ - dosed in mg/24 hours) patch 14 mg  14 mg Transdermal Daily Antonieta Pertlary, Gertha Lichtenberg Lawson, MD   14 mg at 09/30/20 0830  . nitrofurantoin (macrocrystal-monohydrate) (MACROBID) capsule 100 mg  100 mg Oral Q12H Singleton, Amy E, MD   100 mg at 09/30/20  0830  . nystatin (MYCOSTATIN/NYSTOP) topical powder   Topical TID Laveda AbbeParks, Laurie Britton, NP   Given at 09/30/20 0830  . polyethylene glycol (MIRALAX / GLYCOLAX) packet 17 g  17 g Oral Daily Mason JimSingleton, Amy E, MD   17 g at 09/30/20 0830  . risperiDONE (RISPERDAL) tablet 4 mg  4 mg Oral QHS Comer Locket, MD   4 mg at 09/29/20 2042  . rosuvastatin (CRESTOR) tablet 40 mg  40 mg Oral Daily Comer Locket, MD   40 mg at 09/30/20 4982  . traZODone (DESYREL) tablet 100 mg  100 mg Oral QHS PRN Comer Locket, MD        Lab Results:  Results for orders placed or performed during the hospital encounter of 09/19/20 (from the past 48 hour(s))  Glucose, capillary     Status: Abnormal   Collection Time: 09/28/20  4:41 PM  Result Value Ref Range   Glucose-Capillary 107 (H) 70 - 99 mg/dL    Comment: Glucose reference range applies only to samples taken after fasting for at least 8 hours.  Glucose, capillary     Status: Abnormal   Collection Time: 09/29/20  6:23 AM  Result Value Ref Range   Glucose-Capillary 107 (H) 70 - 99 mg/dL    Comment: Glucose reference range applies only to samples taken after fasting for at least 8 hours.  Glucose, capillary     Status: Abnormal   Collection Time: 09/29/20 11:47 AM  Result Value Ref Range   Glucose-Capillary 148 (H) 70 - 99 mg/dL    Comment: Glucose reference range applies only to samples taken after fasting for at least 8 hours.  Glucose, capillary     Status: Abnormal   Collection Time: 09/29/20  4:49 PM  Result Value Ref Range   Glucose-Capillary 117 (H) 70 - 99 mg/dL    Comment: Glucose reference range applies only to samples taken after fasting for at least 8 hours.  Glucose, capillary     Status: Abnormal   Collection Time: 09/30/20  5:49 AM  Result Value Ref Range   Glucose-Capillary 101 (H) 70 - 99 mg/dL    Comment: Glucose reference range applies only to samples taken after fasting for at least 8 hours.  Glucose, capillary     Status:  Abnormal   Collection Time: 09/30/20 11:52 AM  Result Value Ref Range   Glucose-Capillary 130 (H) 70 - 99 mg/dL    Comment: Glucose reference range applies only to samples taken after fasting for at least 8 hours.   Comment 1 Notify RN     Blood Alcohol level:  Lab Results  Component Value Date   ETH <10 09/16/2020   ETH <10 12/03/2019    Metabolic Disorder Labs: Lab Results  Component Value Date   HGBA1C 6.5 (H) 09/16/2020   MPG 139.85 09/16/2020   MPG 128.37 12/28/2018   Lab Results  Component Value Date   PROLACTIN 15.1 07/03/2015   Lab Results  Component Value Date   CHOL 106 09/16/2020   TRIG 315 (H) 09/16/2020   HDL 28 (L) 09/16/2020   CHOLHDL 3.8 09/16/2020   VLDL 63 (H) 09/16/2020   LDLCALC 15 09/16/2020   LDLCALC 38 12/28/2018    Physical Findings: AIMS: Facial and Oral Movements Muscles of Facial Expression: None, normal Lips and Perioral Area: None, normal Jaw: None, normal Tongue: None, normal,Extremity Movements Upper (arms, wrists, hands, fingers): None, normal Lower (legs, knees, ankles, toes): None, normal, Trunk Movements Neck, shoulders, hips: None, normal, Overall Severity Severity of abnormal movements (highest score from questions above): None, normal Incapacitation due to abnormal movements: None, normal Patient's awareness of abnormal movements (rate only patient's report): No Awareness, Dental Status Current problems with teeth and/or dentures?: No Does patient usually wear dentures?: No  CIWA:    COWS:  Musculoskeletal: Strength & Muscle Tone: decreased Gait & Station: shuffle Patient leans: N/A  Psychiatric Specialty Exam:  Presentation  General Appearance: Fairly Groomed  Eye Contact:Fair  Speech:Normal Rate  Speech Volume:Normal  Handedness:Right   Mood and Affect  Mood:Anxious  Affect:Congruent   Thought Process  Thought Processes:Goal Directed  Descriptions of  Associations:Circumstantial  Orientation:Full (Time, Place and Person)  Thought Content:Rumination  History of Schizophrenia/Schizoaffective disorder:No  Duration of Psychotic Symptoms:No data recorded Hallucinations:No data recorded Ideas of Reference:None  Suicidal Thoughts:No data recorded Homicidal Thoughts:No data recorded  Sensorium  Memory:Immediate Fair; Recent Fair; Remote Fair  Judgment:Poor  Insight:Fair   Executive Functions  Concentration:Fair  Attention Span:Fair  Recall:Poor  Fund of Knowledge:Poor  Language:Fair   Psychomotor Activity  Psychomotor Activity:No data recorded  Assets  Assets:Desire for Improvement; Resilience   Sleep  Sleep:No data recorded   Physical Exam: Physical Exam Vitals and nursing note reviewed.  Constitutional:      Appearance: She is obese.  HENT:     Head: Normocephalic and atraumatic.  Pulmonary:     Effort: Pulmonary effort is normal.  Neurological:     General: No focal deficit present.     Mental Status: She is alert.    ROS Blood pressure 129/64, pulse (!) 50, temperature 97.6 F (36.4 C), resp. rate 16, height  (1.549 m), weight 105.7 kg, SpO2 94 %. Body mass index is 44.02 kg/m.   Treatment Plan Summary: Daily contact with patient to assess and evaluate symptoms and progress in treatment, Medication management and Plan : Patient is seen and examined.  Patient is a 70 year old female with the above-stated past psychiatric history who is seen in follow-up.   Diagnosis: 1. Bipolar disorder, most recently depressed 2. Type 2 diabetes mellitus 3. Hypothyroidism 4. Mild cognitive impairment 5.  Urinary tract infection  Pertinent findings on examination today: 1.  Depression seems to be significantly improved. 2.  She denies any suicidal ideation. 3.  Blood sugar is stable. 4.  Sleep is still poor. 5.  Patient is spending more time in bed, in the wheelchair.  Nursing staff reports  preliminary development of pressure areas. 6.  Vital signs are stable 7.  Urine from 5/16 appears to be infected, cultures are pending.  Plan: 1.  Continue a coated aspirin 81 mg p.o. daily for heart health. 2.  Continue Cogentin 0.5 mg p.o. twice daily for side effects of medication. 3.  Continue Wellbutrin XL 150 mg p.o. daily for depression and anxiety. 4.  Continue Depakote extended release 750 mg p.o. nightly for mood stability. 5.  Continue levothyroxine 175 mcg p.o. daily for hypothyroidism. 6.  Continue Claritin 10 mg p.o. daily for seasonal allergies. 7.  Continue metformin 500 mg p.o. daily for diabetes mellitus type 2 8.  Continue Macrobid 100 mg p.o. every 12 hours for urinary tract infection.  We are still awaiting culture data. 9.  Repeat TSH 10.  Continue nystatin topically 3 times daily to skin where fungal infection is present. 11.  Continue polyethylene glycol for constipation 17 g in 8 ounces water p.o. daily. 12.  Continue Risperdal 4 mg p.o. nightly for mood stability and psychosis. 13.  Continue Crestor 40 mg p.o. daily for hyperlipidemia. 14.  Continue trazodone but increase to 150 mg p.o. nightly for insomnia. 15.  Depakote level, liver function enzymes, CBC ordered for a.m. tomorrow.  We will add TSH. 16.  Have special hospital bed for folks with predisposition to pressure sores to be placed  on our unit. 17.  Out of bed 4 times daily. 18.  Disposition planning-in progress.   Antonieta Pert, MD 09/30/2020, 12:58 PM

## 2020-09-30 NOTE — Progress Notes (Signed)
Recreation Therapy Notes  Animal-Assisted Activity (AAA) Program Checklist/Progress Notes Patient Eligibility Criteria Checklist & Daily Group note for Rec Tx Intervention  Date: 5.17.22 Time: 1430 Location: [address]   AAA/T Program Assumption of Risk Form signed by Engineer, production or Parent Legal Guardian  YES  Patient is free of allergies or severe asthma  YES  Patient reports no fear of animals  YES   Patient reports no history of cruelty to animals YES  Patient understands his/her participation is voluntary YES   Patient washes hands before animal contact YES  Patient washes hands after animal contact  YES  Behavioral Response: Engaged  Education: Charity fundraiser, Appropriate Animal Interaction   Education Outcome: Acknowledges understanding/In group clarification offered/Needs additional education.   Clinical Observations/Feedback: Pt attended and participated in activity.    Caroll Rancher, LRT/CTRS         Caroll Rancher A 09/30/2020 3:33 PM

## 2020-09-30 NOTE — Progress Notes (Signed)
1:1 Note Pt reported sat down on the floor after using the bathroom, pt reported washing her hands on the sink while using the walker, pt stated her legs gave up and could not stand anymore, pt was helped to the bathroom floor, no injuries sustained. Pt required 4 person to get her off the floor. Pt is back in the bed safe, remains on 1:1 for safety, will continue to monitor.

## 2020-09-30 NOTE — Progress Notes (Addendum)
1:1 Note:   Pt was observed eating lunch in the dayroom. No behavioral changes. Pt remains safe. Sitter within arms reach.

## 2020-09-30 NOTE — Progress Notes (Signed)
1:1 Note Pt observed seated on the bed during shift exchange talking to staff. Pt stated she had a good day, good appetite. Pt also reported having walked on the hallway with help. Pt is calm and cooperative, no fall noted, remains on 1:1 for safety, will continue to monitor.

## 2020-09-30 NOTE — BHH Counselor (Signed)
CSW was waiting for a scheduled meeting between APS worker, Julie Morrow and the patient at 11:00am today.  CSW attempted to contact Julie Morrow at [facility] APS and also sent Julie Morrow an Agricultural engineer when the APS worker did not show for the meeting.  CSW will continue to attempt to contact Julie Morrow to find out more about the missed meeting and what is happening with Julie Morrow's APS case.

## 2020-09-30 NOTE — BHH Counselor (Signed)
CSW met with Ms. Julie Morrow, Mrs. Julie Morrow, and Mrs. Julie Morrow (APS Social Workers).  Mrs. Julie Morrow asked Ms. Julie Morrow various questions to test her memory and cognitive abilities.  Ms. Julie Morrow was unable to recall her current address, any phone numbers of family or friends, the area code, any medical conditions besides diabetes, names of her medications, why is in the hospital, or the 3 words that Mrs. Julie Morrow asked her to remember.  Ms. Julie Morrow states that she can walk on her own and take care of herself but cannot state how she will accomplish this.  Ms. Julie Morrow states that at home she has a writing pad with her personal information on it so she can remember it.  Mrs. Julie Morrow informed Ms. Julie Morrow that she would need to go to a rehabilitation center to live and Ms. Julie Morrow stated "the hell I do".  After the competency meeting Mrs. Julie Morrow informed CSW that they are in the investigative phase and once they receive her medical records and collaterals they will evaluate and most likely move her to the treatment phase where they will then find the right facility for her.  They state that this will take at least another month.  CSW provided Mrs. Julie Morrow with the family phone numbers and explained to her the type of things they have been seeing from Ms. Julie Morrow while in the hospital.  Mrs. Julie Morrow states that she will be back next week, possible on Thursday 10/09/2020, to complete another meeting with Ms. Julie Morrow.  CSW thanked Mrs. Julie Morrow and walked her off the unit.  CSW will continue to update Mrs. Julie Morrow with information as the case progresses to help secure placement for Ms. Julie Morrow upon discharge.

## 2020-09-30 NOTE — Progress Notes (Signed)
PT went to the bathroom and sat on the floor when washing her hands. PT was not hurt but MHT's and nurses had to help the PT off of the floor. PT's legs seem to give out at times. PT has sat on the floor multiple times during her stay. We advise PT uses wheelchair only.

## 2020-10-01 DIAGNOSIS — F3160 Bipolar disorder, current episode mixed, unspecified: Secondary | ICD-10-CM | POA: Diagnosis not present

## 2020-10-01 LAB — URINALYSIS, COMPLETE (UACMP) WITH MICROSCOPIC
Bilirubin Urine: NEGATIVE
Glucose, UA: NEGATIVE mg/dL
Hgb urine dipstick: NEGATIVE
Ketones, ur: NEGATIVE mg/dL
Leukocytes,Ua: NEGATIVE
Nitrite: NEGATIVE
Protein, ur: NEGATIVE mg/dL
Specific Gravity, Urine: 1.012 (ref 1.005–1.030)
pH: 5 (ref 5.0–8.0)

## 2020-10-01 LAB — TSH: TSH: 0.411 u[IU]/mL (ref 0.350–4.500)

## 2020-10-01 LAB — GLUCOSE, CAPILLARY
Glucose-Capillary: 112 mg/dL — ABNORMAL HIGH (ref 70–99)
Glucose-Capillary: 103 mg/dL — ABNORMAL HIGH (ref 70–99)
Glucose-Capillary: 130 mg/dL — ABNORMAL HIGH (ref 70–99)

## 2020-10-01 LAB — URINE CULTURE

## 2020-10-01 NOTE — Progress Notes (Signed)
1:1 Nursing Note  Pt continues on 1:1 for safety since she is a high fall risk. Pt remains asleep in his bed. Her respirations are even and unlabored. No distress has been observed. Q 15 min safety checks continue. Pt's safety has been maintained.

## 2020-10-01 NOTE — Progress Notes (Signed)
Recreation Therapy Notes  Date: 5.18.22 Time: 0950 Location: 500 Hall Dayroom  Group Topic: Stress Management  Goal Area(s) Addresses:  Patient will identify positive stress management techniques. Patient will identify benefits of using stress management post d/c.  Behavioral Response:  Attentive  Intervention: Stress Management  Activity: Meditation.  LRT played a meditation that focused on taking on the characteristics of a mountain.  The meditation focused on being unmovable in the face of changes/obstacles that occur in life.     Education:  Stress Management, Discharge Planning.   Education Outcome: Acknowledges Education  Clinical Observations/Feedback: Pt attended and participated in group.    Caroll Rancher, LRT/CTRS         Caroll Rancher A 10/01/2020 10:53 AM

## 2020-10-01 NOTE — Progress Notes (Addendum)
@  1119 Pt. Walked x2. Pt. Walked with walker, gait belt MHT, Diplomatic Services operational officer, RN and wheelchair trailing to her room and toileted. Pt walked back to the day room and sat in wheelchair. Pt. Drank ice water.

## 2020-10-01 NOTE — BHH Group Notes (Signed)
Type of Therapy and Topic:  Group Therapy:  Setting Goals   Participation Level:  Active   Description of Group: In this process group, patients discussed using strengths to work toward goals and address challenges.  Patients identified two positive things about themselves and one goal they were working on.  Patients were given the opportunity to share openly and support each other's plan for self-empowerment.  The group discussed the value of gratitude and were encouraged to have a daily reflection of positive characteristics or circumstances.  Patients were encouraged to identify a plan to utilize their strengths to work on current challenges and goals.   Therapeutic Goals 1. Patient will verbalize personal strengths/positive qualities and relate how these can assist with achieving desired personal goals 2. Patients will verbalize affirmation of peers plans for personal change and goal setting 3. Patients will explore the value of gratitude and positive focus as related to successful achievement of goals 4. Patients will verbalize a plan for regular reinforcement of personal positive qualities and circumstances.   Summary of Patient Progress: Patient identified the definition of goals. Patients was given the opportunity to share openly and support other group members' plan for self-empowerment. Patient verbalized personal strength and how they relate to achieving the desired goal. Patient was able to identify positive goals to work towards when she returns home.  

## 2020-10-01 NOTE — Progress Notes (Signed)
1:1  Pt. Called her brother and ate her dinner. Safety maintained 1:1 continues.

## 2020-10-01 NOTE — Progress Notes (Signed)
[facility]  10/01/2020 2:07 PM Arther DamesSusan B Scruton  MRN:  [id number] Subjective:  Patient is a 70 year old female with a reported past psychiatric history significant for bipolar disorder, anxiety disorder, depression who originally presented to the [facility] emergency department on 09/13/2020 after having been dropped off by her sister. The patient's original complaint was "I cannot take it anymore".  Objective: Patient is seen and examined.  Patient is a 70 year old female familiar to me from my last rotation.  The patient had been admitted on 5/7 after transfer from the [facility] emergency department.  She is essentially unchanged from yesterday.  We have been getting her out of bed and walking her with a walker more frequently.  I have told the nurses to do it 4 times a day.  She does not remember seeing me yesterday.  She is alert and oriented x4.  She continues to ask when she is going home.  We again discussed the guardianship issue as well as trying to find a skilled nursing facility for her.  Her blood sugar this morning is 103.  Her repeat TSH is normalized at 0.411.  It was greater than 1 on last check.  Her urinalysis from 5/16 showed multiple species present, suggested recollection.  She was started on Macrobid on 5/15.  She has liver function enzymes, Depakote level and a CBC pending for tomorrow morning.  She remains a bit bradycardic at 57, the rest of her vital signs are stable.  She slept 6.75 hours last night.  Principal Problem: Bipolar depression (HCC) Diagnosis: Principal Problem:   Bipolar depression (HCC) Active Problems:   HLD (hyperlipidemia)   Hypothyroidism   Diabetes (HCC)   Major neurocognitive disorder (HCC)  Total Time spent with patient: 15 minutes  Past Psychiatric History: See admission H&P  Past Medical History:  Past Medical History:  Diagnosis Date  . Anxiety   . Bipolar 1 disorder (HCC)   . Bradycardia    . Depression   . Diabetes mellitus, type II (HCC)    Patient takes Glucotrol and Januvia  . Hypothyroidism   . Lithium toxicity     Past Surgical History:  Procedure Laterality Date  . ABDOMINAL HYSTERECTOMY    . CHOLECYSTECTOMY     Family History:  Family History  Problem Relation Age of Onset  . Dementia Mother   . Arthritis Father    Family Psychiatric  History: See admission H&P Social History:  Social History   Substance and Sexual Activity  Alcohol Use No  . Alcohol/week: 0.0 standard drinks     Social History   Substance and Sexual Activity  Drug Use No    Social History   Socioeconomic History  . Marital status: Single    Spouse name: Not on file  . Number of children: Not on file  . Years of education: Not on file  . Highest education level: Not on file  Occupational History  . Not on file  Tobacco Use  . Smoking status: Current Every Day Smoker    Packs/day: 0.50    Years: 20.00    Pack years: 10.00    Types: Cigarettes    Start date: 11/06/1984  . Smokeless tobacco: Never Used  Vaping Use  . Vaping Use: Never used  Substance and Sexual Activity  . Alcohol use: No    Alcohol/week: 0.0 standard drinks  . Drug use: No  . Sexual activity: Not Currently  Other Topics Concern  .  Not on file  Social History Narrative  . Not on file   Social Determinants of Health   Financial Resource Strain: Not on file  Food Insecurity: Not on file  Transportation Needs: Not on file  Physical Activity: Not on file  Stress: Not on file  Social Connections: Not on file   Additional Social History:                         Sleep: Good  Appetite:  Good  Current Medications: Current Facility-Administered Medications  Medication Dose Route Frequency Provider Last Rate Last Admin  . acetaminophen (TYLENOL) tablet 650 mg  650 mg Oral Q6H PRN Clapacs, Jackquline Denmark, MD   650 mg at 09/28/20 2054  . alum & mag hydroxide-simeth (MAALOX/MYLANTA) 200-200-20  MG/5ML suspension 30 mL  30 mL Oral Q4H PRN Clapacs, John T, MD      . aspirin EC tablet 81 mg  81 mg Oral Daily Clapacs, Jackquline Denmark, MD   81 mg at 10/01/20 0904  . benztropine (COGENTIN) tablet 0.5 mg  0.5 mg Oral BID Bartholomew Crews E, MD   0.5 mg at 10/01/20 0904  . buPROPion (WELLBUTRIN XL) 24 hr tablet 150 mg  150 mg Oral Daily Bartholomew Crews E, MD   150 mg at 10/01/20 0905  . divalproex (DEPAKOTE ER) 24 hr tablet 750 mg  750 mg Oral QHS Bartholomew Crews E, MD   750 mg at 09/30/20 2052  . docusate sodium (COLACE) capsule 100 mg  100 mg Oral Daily Mason Jim, Amy E, MD   100 mg at 10/01/20 0904  . fenofibrate tablet 160 mg  160 mg Oral Daily Clapacs, Jackquline Denmark, MD   160 mg at 10/01/20 0906  . levothyroxine (SYNTHROID) tablet 175 mcg  175 mcg Oral Q0600 Comer Locket, MD   175 mcg at 10/01/20 8127  . loratadine (CLARITIN) tablet 10 mg  10 mg Oral Daily Clapacs, Jackquline Denmark, MD   10 mg at 10/01/20 0906  . magnesium hydroxide (MILK OF MAGNESIA) suspension 30 mL  30 mL Oral Daily PRN Clapacs, John T, MD      . metFORMIN (GLUCOPHAGE) tablet 500 mg  500 mg Oral Q breakfast Clapacs, Jackquline Denmark, MD   500 mg at 10/01/20 0905  . nicotine (NICODERM CQ - dosed in mg/24 hours) patch 14 mg  14 mg Transdermal Daily Antonieta Pert, MD   14 mg at 10/01/20 0909  . nitrofurantoin (macrocrystal-monohydrate) (MACROBID) capsule 100 mg  100 mg Oral Q12H Mason Jim, Amy E, MD   100 mg at 10/01/20 0904  . nystatin (MYCOSTATIN/NYSTOP) topical powder   Topical TID Laveda Abbe, NP   Given at 10/01/20 1225  . polyethylene glycol (MIRALAX / GLYCOLAX) packet 17 g  17 g Oral Daily Comer Locket, MD   17 g at 10/01/20 0908  . risperiDONE (RISPERDAL) tablet 4 mg  4 mg Oral QHS Comer Locket, MD   4 mg at 09/30/20 2052  . rosuvastatin (CRESTOR) tablet 40 mg  40 mg Oral Daily Comer Locket, MD   40 mg at 10/01/20 0904  . traZODone (DESYREL) tablet 150 mg  150 mg Oral QHS PRN Antonieta Pert, MD        Lab Results:   Results for orders placed or performed during the hospital encounter of 09/19/20 (from the past 48 hour(s))  Glucose, capillary     Status: Abnormal   Collection Time: 09/29/20  4:49 PM  Result Value Ref Range   Glucose-Capillary 117 (H) 70 - 99 mg/dL    Comment: Glucose reference range applies only to samples taken after fasting for at least 8 hours.  Glucose, capillary     Status: Abnormal   Collection Time: 09/30/20  5:49 AM  Result Value Ref Range   Glucose-Capillary 101 (H) 70 - 99 mg/dL    Comment: Glucose reference range applies only to samples taken after fasting for at least 8 hours.  Glucose, capillary     Status: Abnormal   Collection Time: 09/30/20 11:52 AM  Result Value Ref Range   Glucose-Capillary 130 (H) 70 - 99 mg/dL    Comment: Glucose reference range applies only to samples taken after fasting for at least 8 hours.   Comment 1 Notify RN   Glucose, capillary     Status: Abnormal   Collection Time: 09/30/20  5:11 PM  Result Value Ref Range   Glucose-Capillary 105 (H) 70 - 99 mg/dL    Comment: Glucose reference range applies only to samples taken after fasting for at least 8 hours.   Comment 1 Notify RN   Glucose, capillary     Status: Abnormal   Collection Time: 10/01/20  6:05 AM  Result Value Ref Range   Glucose-Capillary 112 (H) 70 - 99 mg/dL    Comment: Glucose reference range applies only to samples taken after fasting for at least 8 hours.  TSH     Status: None   Collection Time: 10/01/20  6:32 AM  Result Value Ref Range   TSH 0.411 0.350 - 4.500 uIU/mL    Comment: Performed by a 3rd Generation assay with a functional sensitivity of <=0.01 uIU/mL. Performed at [facility], [address], [place], [place] [address]   Glucose, capillary     Status: Abnormal   Collection Time: 10/01/20 11:52 AM  Result Value Ref Range   Glucose-Capillary 103 (H) 70 - 99 mg/dL    Comment: Glucose reference range applies only to samples taken after  fasting for at least 8 hours.    Blood Alcohol level:  Lab Results  Component Value Date   ETH <10 09/16/2020   ETH <10 12/03/2019    Metabolic Disorder Labs: Lab Results  Component Value Date   HGBA1C 6.5 (H) 09/16/2020   MPG 139.85 09/16/2020   MPG 128.37 12/28/2018   Lab Results  Component Value Date   PROLACTIN 15.1 07/03/2015   Lab Results  Component Value Date   CHOL 106 09/16/2020   TRIG 315 (H) 09/16/2020   HDL 28 (L) 09/16/2020   CHOLHDL 3.8 09/16/2020   VLDL 63 (H) 09/16/2020   LDLCALC 15 09/16/2020   LDLCALC 38 12/28/2018    Physical Findings: AIMS: Facial and Oral Movements Muscles of Facial Expression: None, normal Lips and Perioral Area: None, normal Jaw: None, normal Tongue: None, normal,Extremity Movements Upper (arms, wrists, hands, fingers): None, normal Lower (legs, knees, ankles, toes): None, normal, Trunk Movements Neck, shoulders, hips: None, normal, Overall Severity Severity of abnormal movements (highest score from questions above): None, normal Incapacitation due to abnormal movements: None, normal Patient's awareness of abnormal movements (rate only patient's report): No Awareness, Dental Status Current problems with teeth and/or dentures?: No Does patient usually wear dentures?: No  CIWA:    COWS:     Musculoskeletal: Strength & Muscle Tone: decreased Gait & Station: unsteady Patient leans: N/A  Psychiatric Specialty Exam:  Presentation  General Appearance: Fairly Groomed  Eye  Contact:Fair  Speech:Normal Rate  Speech Volume:Normal  Handedness:Right   Mood and Affect  Mood:Euthymic  Affect:Congruent   Thought Process  Thought Processes:Goal Directed  Descriptions of Associations:Circumstantial  Orientation:Full (Time, Place and Person)  Thought Content:Logical  History of Schizophrenia/Schizoaffective disorder:No  Duration of Psychotic Symptoms:No data recorded Hallucinations:Hallucinations: None  Ideas  of Reference:None  Suicidal Thoughts:Suicidal Thoughts: No  Homicidal Thoughts:Homicidal Thoughts: No   Sensorium  Memory:Immediate Fair; Recent Fair; Remote Fair  Judgment:Poor  Insight:Lacking   Executive Functions  Concentration:Fair  Attention Span:Fair  Recall:Poor  Fund of Knowledge:Poor  Language:Fair   Psychomotor Activity  Psychomotor Activity:Psychomotor Activity: Decreased   Assets  Assets:Desire for Improvement; Resilience   Sleep  Sleep:Sleep: Good Number of Hours of Sleep: 6.75    Physical Exam: Physical Exam Vitals and nursing note reviewed.  Constitutional:      Appearance: She is obese.  HENT:     Head: Normocephalic and atraumatic.  Pulmonary:     Effort: Pulmonary effort is normal.  Neurological:     Mental Status: She is alert and oriented to person, place, and time.    Review of Systems  Respiratory: Positive for shortness of breath.   Cardiovascular: Positive for leg swelling.  Musculoskeletal: Positive for back pain, joint pain and myalgias.  Neurological: Positive for weakness.  Psychiatric/Behavioral: Positive for memory loss.   Blood pressure 137/60, pulse (!) 57, temperature 97.6 F (36.4 C), resp. rate 16, height 5\' 1"  (1.549 m), weight 105.7 kg, SpO2 100 %. Body mass index is 44.02 kg/m.   Treatment Plan Summary: Daily contact with patient to assess and evaluate symptoms and progress in treatment, Medication management and Plan : Patient is seen and examined.  Patient is a 70 year old female with the above-stated past psychiatric history who is seen in follow-up.   Diagnosis: 1. Bipolar disorder, most recently depressed 2. Type 2 diabetes mellitus 3. Hypothyroidism 4. Mild cognitive impairment 5.  Urinary tract infection  Pertinent findings on examination today: 1.  Essentially unchanged from yesterday. 2.  Blood sugar control is good. 3.  Blood pressure is good. 4.  Will need to reorder  urinalysis.  Plan: 1.  Continue a coated aspirin 81 mg p.o. daily for heart health. 2.  Continue Cogentin 0.5 mg p.o. twice daily for side effects of medication. 3.  Continue Wellbutrin XL 150 mg p.o. daily for depression and anxiety. 4.  Continue Depakote extended release 750 mg p.o. nightly for mood stability. 5.  Decrease levothyroxine to 150 mcg p.o. daily for hypothyroidism. 6.  Continue Claritin 10 mg p.o. daily for seasonal allergies. 7.  Continue metformin 500 mg p.o. daily for diabetes mellitus type 2 8.  Continue Macrobid 100 mg p.o. every 12 hours for urinary tract infection.  We are still awaiting culture data. 9.  Continue nystatin topically 3 times daily to skin where fungal infection is present. 10.  Continue polyethylene glycol for constipation 17 g in 8 ounces water p.o. daily. 11.  Continue Risperdal 4 mg p.o. nightly for mood stability and psychosis. 12.  Continue Crestor 40 mg p.o. daily for hyperlipidemia. 13.  Continue trazodone but increase to 150 mg p.o. nightly for insomnia. 14.  Depakote level, liver function enzymes, CBC ordered for a.m. tomorrow.   15.  Have special hospital bed for folks with predisposition to pressure sores to be placed on our unit. 16.  Out of bed 4 times daily. 17.  Disposition planning-in progress.  78, MD 10/01/2020, 2:07 PM

## 2020-10-01 NOTE — Progress Notes (Signed)
1:1 Nursing Note  Pt continues on 1:1 for safety since she is a high fall risk. Pt is currently asleep in her bed. She is snoring. Her respirations are even and unlabored. No distress has been observed. Q 15 min safety checks continue. Pt's safety has been maintained.

## 2020-10-01 NOTE — Progress Notes (Addendum)
1:1 @1523  Pt. Straight cathed in room with 3 nurses and MHT. Afterwards patient up and walked to dayroom with nurse. Safety maintained. 1:1 continues.  1:1 @1644  Pt walked with walker, gait belt, 2 person back to her room. Toilet offered and refused. Pt. Got into bed. MHT present. Safety maintained. 1:1 continues.

## 2020-10-01 NOTE — Progress Notes (Signed)
   10/01/20 0715  Vital Signs  Temp 97.6 F (36.4 C)  Temp Source Oral  Pulse Rate (!) 56  Pulse Rate Source Dinamap  BP (!) 131/59  Oxygen Therapy  SpO2 98 %   D: Patient denies SI/HI/AVH. Patient rated anxiety 8/10 and depression 2/10. Pt. Was a 2 person assist when walking. Pt. Walked down the hall x4, to and from the day room. Pt was cathed and over 830 cc of urine was obtained. Pt. Was toileted. Pt. Spent time in the day room and was social with staff. Pt. Stated "all you have to do is say your going to hurt yourself or others and you'll get help." Pt. Called

## 2020-10-01 NOTE — Progress Notes (Signed)
1:1  Patient in the hall with MHT up at the med window. Safety maintained, 1:1 continues.

## 2020-10-01 NOTE — Progress Notes (Signed)
Psychoeducational Group Note  Date:  10/01/2020 Time:  2015  Group Topic/Focus:  wrap up group  Participation Level: Did Not Attend  Participation Quality:  Not Applicable  Affect:  Not Applicable  Cognitive:  Not Applicable  Insight:  Not Applicable  Engagement in Group: Not Applicable  Additional Comments: Did not attend.  Marcille Buffy 10/01/2020, 10:43 PM

## 2020-10-02 DIAGNOSIS — F0390 Unspecified dementia without behavioral disturbance: Secondary | ICD-10-CM

## 2020-10-02 LAB — CBC WITH DIFFERENTIAL/PLATELET
Abs Immature Granulocytes: 0.01 10*3/uL (ref 0.00–0.07)
Basophils Absolute: 0 10*3/uL (ref 0.0–0.1)
Basophils Relative: 1 %
Eosinophils Absolute: 0.1 10*3/uL (ref 0.0–0.5)
Eosinophils Relative: 2 %
HCT: 40.1 % (ref 36.0–46.0)
Hemoglobin: 13.2 g/dL (ref 12.0–15.0)
Immature Granulocytes: 0 %
Lymphocytes Relative: 33 %
Lymphs Abs: 2.2 10*3/uL (ref 0.7–4.0)
MCH: 31.1 pg (ref 26.0–34.0)
MCHC: 32.9 g/dL (ref 30.0–36.0)
MCV: 94.4 fL (ref 80.0–100.0)
Monocytes Absolute: 0.6 10*3/uL (ref 0.1–1.0)
Monocytes Relative: 9 %
Neutro Abs: 3.7 10*3/uL (ref 1.7–7.7)
Neutrophils Relative %: 55 %
Platelets: 163 10*3/uL (ref 150–400)
RBC: 4.25 MIL/uL (ref 3.87–5.11)
RDW: 13.5 % (ref 11.5–15.5)
WBC: 6.6 10*3/uL (ref 4.0–10.5)
nRBC: 0 % (ref 0.0–0.2)

## 2020-10-02 LAB — HEPATIC FUNCTION PANEL
ALT: 14 U/L (ref 0–44)
AST: 15 U/L (ref 15–41)
Albumin: 3.5 g/dL (ref 3.5–5.0)
Alkaline Phosphatase: 43 U/L (ref 38–126)
Bilirubin, Direct: 0.1 mg/dL (ref 0.0–0.2)
Indirect Bilirubin: 0.4 mg/dL (ref 0.3–0.9)
Total Bilirubin: 0.5 mg/dL (ref 0.3–1.2)
Total Protein: 6 g/dL — ABNORMAL LOW (ref 6.5–8.1)

## 2020-10-02 LAB — GLUCOSE, CAPILLARY: Glucose-Capillary: 138 mg/dL — ABNORMAL HIGH (ref 70–99)

## 2020-10-02 LAB — VALPROIC ACID LEVEL: Valproic Acid Lvl: 34 ug/mL — ABNORMAL LOW (ref 50.0–100.0)

## 2020-10-02 MED ORDER — GERHARDT'S BUTT CREAM
TOPICAL_CREAM | Freq: Two times a day (BID) | CUTANEOUS | Status: DC | PRN
  Administered 2020-10-05 – 2020-10-06 (×2): 1 via TOPICAL
  Filled 2020-10-02 (×4): qty 1

## 2020-10-02 NOTE — Progress Notes (Signed)
Pt sitting up in wheelchair.  Pt took medications without incident.  1:1 remains in place with MHT working with pt today.

## 2020-10-02 NOTE — BHH Counselor (Signed)
After consulting with supervisors Loraine Leriche and Gretta Cool, CSW completed a referral to [facility] for the patient.  CSW faxed this referral and patient information to the Nursing Center.  CSW attempted to contact Donata Clay by phone [phone]) [phone] to speak with her about this referral.  CSW left a secure voicemail asking for a return call to discuss this matter.

## 2020-10-02 NOTE — Progress Notes (Signed)
1:1 Patient in bed sleeping respirations noted no s/s of distress continued to be observed on 1:1 for safety and falls. Patients remains safe.

## 2020-10-02 NOTE — Progress Notes (Signed)
Pt in no acute distress at this time.  1:1 sitter in place.  Pt appears to be in good sprits.

## 2020-10-02 NOTE — Progress Notes (Signed)
Pt requiring 2 person assist to restroom.  Pt is voiding without difficulty.  Pt had Small bowl movement this afternoon.

## 2020-10-02 NOTE — Progress Notes (Signed)
2 person assist to take pt to bathroom.    Pt has pink area across bra line.  Skin tear located under left breast.  Nystop powder applied to breast area.     Buttocks and perineal area and  inner thigh area with reddened skin.  Skin is intact in these areas.  Butt cream applied.  Pt was unable to void or have BM when toileted.  RN will continue to monitor and provide assistance as needed.

## 2020-10-02 NOTE — Progress Notes (Addendum)
[facility] MD Progress Note  10/02/2020 5:13 PM STEPH Morrow  MRN:  [id number]  Subjective: Julie Morrow reports, "My mood okay on & off. I guess I ended up like this because of stress. I had bought a house in [place]. I started the process of moving in, started opening the boxes, this came on me".  Objective:Patient is a 70 year old female with a reported past psychiatric history significant for bipolar disorder, anxiety disorder, depression who originally presented to the [facility] emergency department on 09/13/2020 after having been dropped off by her sister. The patient's original complaint was "I cannot take it anymore". Daily notes: Julie Morrow is seen, chart reviewed. The chart findings discussed with the treatment team. She presents alert, somewhat confused. However, aware she is in the hospital. She is aware that something is going with her health, but unable to express how she is feeling meaningfully. She remains on 1:1 supervision due to generalized weakness, impaired mobility & inability to care for herself (perform ADLS). She uses the cane & walker interchangeably with assistance      to aid her mobility. She is currently requiring assistance with ADLs. She wears adult depends due to incontinence issues. Her case is under review for guardianship as patient is longer able to care for herself. She is a good candidate for skilled nursing facility placement. She is taking & tolerating her treatment regimen. Reviewed current lab reports: Valproic acid level low at 34, norm (50u-100u per ug/ml), CBC with diff - unremarkable. Vital signs stable: Blood pressure 119/78, pulse 67, temperature 98.2 F (36.8 C). Patient appear to be in no apparent distress.   Per previous notes: Patient is seen and examined.  Patient is a 70 year old female familiar to me from my last rotation.  The patient had been admitted on 5/7 after transfer from the [facility] emergency department.  She  is essentially unchanged from yesterday.  We have been getting her out of bed and walking her with a walker more frequently.  I have told the nurses to do it 4 times a day.  She does not remember seeing me yesterday.  She is alert and oriented x4.  She continues to ask when she is going home.  We again discussed the guardianship issue as well as trying to find a skilled nursing facility for her.  Her blood sugar this morning is 103.  Her repeat TSH is normalized at 0.411.  It was greater than 1 on last check.  Her urinalysis from 5/16 showed multiple species present, suggested recollection.  She was started on Macrobid on 5/15.  She has liver function enzymes, Depakote level and a CBC pending for tomorrow morning.  She remains a bit bradycardic at 57, the rest of her vital signs are stable.  She slept 6.75 hours last night.  Principal Problem: Major neurocognitive disorder (HCC)  Diagnosis: Principal Problem:   Major neurocognitive disorder (HCC) Active Problems:   HLD (hyperlipidemia)   Hypothyroidism   Bipolar depression (HCC)   Diabetes (HCC)  Total Time spent with patient: 15 minutes  Past Psychiatric History: See admission H&P  Past Medical History:  Past Medical History:  Diagnosis Date  . Anxiety   . Bipolar 1 disorder (HCC)   . Bradycardia   . Depression   . Diabetes mellitus, type II (HCC)    Patient takes Glucotrol and Januvia  . Hypothyroidism   . Lithium toxicity     Past Surgical History:  Procedure Laterality Date  .  ABDOMINAL HYSTERECTOMY    . CHOLECYSTECTOMY     Family History:  Family History  Problem Relation Age of Onset  . Dementia Mother   . Arthritis Father    Family Psychiatric  History: See admission H&P Social History:  Social History   Substance and Sexual Activity  Alcohol Use No  . Alcohol/week: 0.0 standard drinks     Social History   Substance and Sexual Activity  Drug Use No    Social History   Socioeconomic History  . Marital  status: Single    Spouse name: Not on file  . Number of children: Not on file  . Years of education: Not on file  . Highest education level: Not on file  Occupational History  . Not on file  Tobacco Use  . Smoking status: Current Every Day Smoker    Packs/day: 0.50    Years: 20.00    Pack years: 10.00    Types: Cigarettes    Start date: 11/06/1984  . Smokeless tobacco: Never Used  Vaping Use  . Vaping Use: Never used  Substance and Sexual Activity  . Alcohol use: No    Alcohol/week: 0.0 standard drinks  . Drug use: No  . Sexual activity: Not Currently  Other Topics Concern  . Not on file  Social History Narrative  . Not on file   Social Determinants of Health   Financial Resource Strain: Not on file  Food Insecurity: Not on file  Transportation Needs: Not on file  Physical Activity: Not on file  Stress: Not on file  Social Connections: Not on file   Additional Social History:   Sleep: Good  Appetite:  Good  Current Medications: Current Facility-Administered Medications  Medication Dose Route Frequency Provider Last Rate Last Admin  . acetaminophen (TYLENOL) tablet 650 mg  650 mg Oral Q6H PRN Clapacs, Jackquline Denmark, MD   650 mg at 09/28/20 2054  . alum & mag hydroxide-simeth (MAALOX/MYLANTA) 200-200-20 MG/5ML suspension 30 mL  30 mL Oral Q4H PRN Clapacs, John T, MD      . aspirin EC tablet 81 mg  81 mg Oral Daily Clapacs, Jackquline Denmark, MD   81 mg at 10/02/20 0918  . benztropine (COGENTIN) tablet 0.5 mg  0.5 mg Oral BID Bartholomew Crews E, MD   0.5 mg at 10/02/20 0917  . buPROPion (WELLBUTRIN XL) 24 hr tablet 150 mg  150 mg Oral Daily Bartholomew Crews E, MD   150 mg at 10/02/20 0917  . divalproex (DEPAKOTE ER) 24 hr tablet 750 mg  750 mg Oral QHS Bartholomew Crews E, MD   750 mg at 10/01/20 2205  . docusate sodium (COLACE) capsule 100 mg  100 mg Oral Daily Bartholomew Crews E, MD   100 mg at 10/02/20 0917  . fenofibrate tablet 160 mg  160 mg Oral Daily Clapacs, Jackquline Denmark, MD   160 mg at  10/02/20 0917  . Gerhardt's butt cream   Topical BID PRN White, Patrice L, NP      . levothyroxine (SYNTHROID) tablet 175 mcg  175 mcg Oral Q0600 Comer Locket, MD   175 mcg at 10/01/20 1914  . loratadine (CLARITIN) tablet 10 mg  10 mg Oral Daily Clapacs, Jackquline Denmark, MD   10 mg at 10/02/20 0916  . magnesium hydroxide (MILK OF MAGNESIA) suspension 30 mL  30 mL Oral Daily PRN Clapacs, John T, MD      . metFORMIN (GLUCOPHAGE) tablet 500 mg  500 mg Oral Q breakfast  Clapacs, Jackquline Denmark, MD   500 mg at 10/02/20 0917  . nicotine (NICODERM CQ - dosed in mg/24 hours) patch 14 mg  14 mg Transdermal Daily Antonieta Pert, MD   14 mg at 10/02/20 0918  . nitrofurantoin (macrocrystal-monohydrate) (MACROBID) capsule 100 mg  100 mg Oral Q12H Mason Jim, Amy E, MD   100 mg at 10/02/20 0917  . nystatin (MYCOSTATIN/NYSTOP) topical powder   Topical TID Laveda Abbe, NP   Given at 10/02/20 1323  . polyethylene glycol (MIRALAX / GLYCOLAX) packet 17 g  17 g Oral Daily Comer Locket, MD   17 g at 10/02/20 0918  . risperiDONE (RISPERDAL) tablet 4 mg  4 mg Oral QHS Comer Locket, MD   4 mg at 10/01/20 2205  . rosuvastatin (CRESTOR) tablet 40 mg  40 mg Oral Daily Comer Locket, MD   40 mg at 10/02/20 0916  . traZODone (DESYREL) tablet 150 mg  150 mg Oral QHS PRN Antonieta Pert, MD   150 mg at 10/01/20 2215   Lab Results:  Results for orders placed or performed during the hospital encounter of 09/19/20 (from the past 48 hour(s))  Glucose, capillary     Status: Abnormal   Collection Time: 10/01/20  6:05 AM  Result Value Ref Range   Glucose-Capillary 112 (H) 70 - 99 mg/dL    Comment: Glucose reference range applies only to samples taken after fasting for at least 8 hours.  TSH     Status: None   Collection Time: 10/01/20  6:32 AM  Result Value Ref Range   TSH 0.411 0.350 - 4.500 uIU/mL    Comment: Performed by a 3rd Generation assay with a functional sensitivity of <=0.01 uIU/mL. Performed at [facility], [address] [place], [place] [address]   Glucose, capillary     Status: Abnormal   Collection Time: 10/01/20 11:52 AM  Result Value Ref Range   Glucose-Capillary 103 (H) 70 - 99 mg/dL    Comment: Glucose reference range applies only to samples taken after fasting for at least 8 hours.  Urinalysis, Complete w Microscopic Urine, Catheterized     Status: Abnormal   Collection Time: 10/01/20  2:45 PM  Result Value Ref Range   Color, Urine YELLOW YELLOW   APPearance CLEAR CLEAR   Specific Gravity, Urine 1.012 1.005 - 1.030   pH 5.0 5.0 - 8.0   Glucose, UA NEGATIVE NEGATIVE mg/dL   Hgb urine dipstick NEGATIVE NEGATIVE   Bilirubin Urine NEGATIVE NEGATIVE   Ketones, ur NEGATIVE NEGATIVE mg/dL   Protein, ur NEGATIVE NEGATIVE mg/dL   Nitrite NEGATIVE NEGATIVE   Leukocytes,Ua NEGATIVE NEGATIVE   RBC / HPF 0-5 0 - 5 RBC/hpf   WBC, UA 0-5 0 - 5 WBC/hpf   Bacteria, UA RARE (A) NONE SEEN   Squamous Epithelial / LPF 0-5 0 - 5   Mucus PRESENT     Comment: Performed at [facility], [address], [place], [place] [address]  Glucose, capillary     Status: Abnormal   Collection Time: 10/01/20  5:00 PM  Result Value Ref Range   Glucose-Capillary 130 (H) 70 - 99 mg/dL    Comment: Glucose reference range applies only to samples taken after fasting for at least 8 hours.   Comment 1 Notify RN   Valproic acid level     Status: Abnormal   Collection Time: 10/02/20  6:30 AM  Result Value Ref Range  Valproic Acid Lvl 34 (L) 50.0 - 100.0 ug/mL    Comment: Performed at [facility], [address], [place], [place] [address]  CBC with Differential/Platelet     Status: None   Collection Time: 10/02/20  6:30 AM  Result Value Ref Range   WBC 6.6 4.0 - 10.5 K/uL   RBC 4.25 3.87 - 5.11 MIL/uL   Hemoglobin 13.2 12.0 - 15.0 g/dL   HCT 08.6 57.8 - 46.9 %   MCV 94.4 80.0 - 100.0 fL   MCH 31.1 26.0 - 34.0 pg   MCHC 32.9 30.0 - 36.0 g/dL    RDW 62.9 52.8 - 41.3 %   Platelets 163 150 - 400 K/uL   nRBC 0.0 0.0 - 0.2 %   Neutrophils Relative % 55 %   Neutro Abs 3.7 1.7 - 7.7 K/uL   Lymphocytes Relative 33 %   Lymphs Abs 2.2 0.7 - 4.0 K/uL   Monocytes Relative 9 %   Monocytes Absolute 0.6 0.1 - 1.0 K/uL   Eosinophils Relative 2 %   Eosinophils Absolute 0.1 0.0 - 0.5 K/uL   Basophils Relative 1 %   Basophils Absolute 0.0 0.0 - 0.1 K/uL   Immature Granulocytes 0 %   Abs Immature Granulocytes 0.01 0.00 - 0.07 K/uL    Comment: Performed at [facility], [address], [place], [place] [address]  Hepatic function panel     Status: Abnormal   Collection Time: 10/02/20  6:30 AM  Result Value Ref Range   Total Protein 6.0 (L) 6.5 - 8.1 g/dL   Albumin 3.5 3.5 - 5.0 g/dL   AST 15 15 - 41 U/L   ALT 14 0 - 44 U/L   Alkaline Phosphatase 43 38 - 126 U/L   Total Bilirubin 0.5 0.3 - 1.2 mg/dL   Bilirubin, Direct 0.1 0.0 - 0.2 mg/dL   Indirect Bilirubin 0.4 0.3 - 0.9 mg/dL    Comment: Performed at [facility], [address] [place], [place] [address]   Blood Alcohol level:  Lab Results  Component Value Date   [facility] <10 09/16/2020   ETH <10 12/03/2019   Metabolic Disorder Labs: Lab Results  Component Value Date   HGBA1C 6.5 (H) 09/16/2020   MPG 139.85 09/16/2020   MPG 128.37 12/28/2018   Lab Results  Component Value Date   PROLACTIN 15.1 07/03/2015   Lab Results  Component Value Date   CHOL 106 09/16/2020   TRIG 315 (H) 09/16/2020   HDL 28 (L) 09/16/2020   CHOLHDL 3.8 09/16/2020   VLDL 63 (H) 09/16/2020   LDLCALC 15 09/16/2020   LDLCALC 38 12/28/2018   Physical Findings: AIMS: Facial and Oral Movements Muscles of Facial Expression: None, normal Lips and Perioral Area: None, normal Jaw: None, normal Tongue: None, normal,Extremity Movements Upper (arms, wrists, hands, fingers): None, normal Lower (legs, knees, ankles, toes): None, normal, Trunk Movements Neck, shoulders,  hips: None, normal, Overall Severity Severity of abnormal movements (highest score from questions above): None, normal Incapacitation due to abnormal movements: None, normal Patient's awareness of abnormal movements (rate only patient's report): No Awareness, Dental Status Current problems with teeth and/or dentures?: No Does patient usually wear dentures?: No  CIWA:    COWS:     Musculoskeletal: Strength & Muscle Tone: decreased Gait & Station: unsteady Patient leans: N/A  Psychiatric Specialty Exam:  Presentation  General Appearance: Fairly Groomed  Eye Contact:Fair  Speech:Normal Rate  Speech Volume:Normal  Handedness:Right  Mood  and Affect  Mood:Euthymic  Affect:Congruent  Thought Process  Thought Processes:Goal Directed  Descriptions of Associations:Circumstantial  Orientation:Full (Time, Place and Person)  Thought Content:Logical  History of Schizophrenia/Schizoaffective disorder:No  Duration of Psychotic Symptoms:No data recorded Hallucinations:Hallucinations: None  Ideas of Reference:None  Suicidal Thoughts:Suicidal Thoughts: No  Homicidal Thoughts:Homicidal Thoughts: No  Sensorium  Memory:Immediate Fair; Recent Fair; Remote Fair  Judgment:Poor  Insight:Lacking  Executive Functions  Concentration:Fair  Attention Span:Fair  Recall:Poor  Fund of Knowledge:Poor  Language:Fair  Psychomotor Activity  Psychomotor Activity:Psychomotor Activity: Decreased  Assets  Assets:Desire for Improvement; Resilience  Sleep  Sleep:Sleep: Good Number of Hours of Sleep: 6.75  Physical Exam: Physical Exam Vitals and nursing note reviewed.  Constitutional:      Appearance: She is obese.  HENT:     Head: Normocephalic and atraumatic.  Pulmonary:     Effort: Pulmonary effort is normal.  Neurological:     Mental Status: She is alert and oriented to person, place, and time.    Review of Systems  Respiratory: Positive for shortness of breath.    Cardiovascular: Positive for leg swelling.  Musculoskeletal: Positive for back pain, joint pain and myalgias.  Neurological: Positive for weakness.  Psychiatric/Behavioral: Positive for memory loss.   Blood pressure 119/78, pulse 67, temperature 98.2 F (36.8 C), temperature source Oral, resp. rate 16, height 5\' 1"  (1.549 m), weight 105.7 kg, SpO2 96 %. Body mass index is 44.02 kg/m.  Treatment Plan Summary: Daily contact with patient to assess and evaluate symptoms and progress in treatment, Medication management and Plan : Patient is seen and examined.  Patient is a 70 year old female with the above-stated past psychiatric history who is seen in follow-up.   Continue inpatient hospitalization.  Will continue today 10/02/2020 plan as below except where it is noted.  Diagnosis: 1. Bipolar disorder, most recently depressed 2. Type 2 diabetes mellitus 3. Hypothyroidism 4. Mild cognitive impairment 5.  Urinary tract infection  Pertinent findings on examination today: 1.  Essentially unchanged from yesterday. 2.  Blood sugar control is good. 3.  Blood pressure is good. 4.  Will need to reorder urinalysis.  Plan: 1.  Continue a coated aspirin 81 mg p.o. daily for heart health. 2.  Continue Cogentin 0.5 mg p.o. twice daily for side effects of medication. 3.  Continue Wellbutrin XL 150 mg p.o. daily for depression and anxiety. 4.  Continue Depakote extended release 750 mg p.o. nightly for mood stability. 5.  Continue levothyroxine 150 mcg p.o. daily for hypothyroidism. 6.  Continue Claritin 10 mg p.o. daily for seasonal allergies. 7.  Continue metformin 500 mg p.o. daily for diabetes mellitus type 2 8.  Continue Macrobid 100 mg p.o. every 12 hours for urinary tract infection.  We are still awaiting culture data. 9.  Continue nystatin topically 3 times daily to skin where fungal infection is present. 10.  Continue polyethylene glycol for constipation 17 g in 8 ounces water p.o.  daily. 11.  Continue Risperdal 4 mg p.o. nightly for mood stability and psychosis. 12.  Continue Crestor 40 mg p.o. daily for hyperlipidemia. 13.  Continue trazodone but increase to 150 mg p.o. nightly for insomnia. 14.  Depakote level, liver function enzymes, CBC ordered for a.m. tomorrow: Results reviewed - Valproic acid level low at 34, (norm- 50u-100u per ug/ml), CBC with diff - unremarkable. 15.  Have special hospital bed for folks with predisposition to pressure sores to be placed on our unit. 16.  Out of bed 4 times daily. 17.  Disposition planning-in progress.  Armandina Stammer, NP 10/02/2020, 5:13 PMPatient ID: Julie Morrow, female   DOB: [birth year], 70 y.o.   MRN: [id number]

## 2020-10-02 NOTE — Progress Notes (Signed)
Nursing 1:1 note D:Pt observed sitting in wheelchair at nurse's station with her sitter. RR even and unlabored. No distress noted. A: 1:1 observation continues for safety  R: Pt remains safe

## 2020-10-02 NOTE — Progress Notes (Signed)
1:1 NOTE  Patient compliant with medication requested trazodone prn for sleep at HS Trazodone 150mg  PO given and effective.Patient observed on 1:1 for safety was assisted to the Bathroom by staff all fall protocol in place Patient ambulating with walker but requires support and multiple redirection on using the walker, her legs are week, Patient gait is very unsteady had 2 Person assist and gait belt in place. Patient safety mantained.

## 2020-10-02 NOTE — [facility] Counselor (Signed)
CSW spoke with Debby Freiberg at [facility] [phone] and was informed that the patient could not be accepted at this time due to the patient currently being on a 1:1 (one to one) at [facility] and to "not being a good discharge disposition".  Mrs. Charlestine Massed states that their facility would be unable to provide the patient with the proper amount of supervision and would not be able find placement if the family did not want to take the patient back home.  CSW will continue to seek appropriate placement for this patient.

## 2020-10-02 NOTE — Progress Notes (Signed)
Physical Therapy Treatment Patient Details Name: Julie Morrow MRN: [id number] DOB: April 11, 1951 Today's Date: 10/02/2020    History of Present Illness Pt is a 70 y/o female with PMHx of bipolar disorder, anxiety disorder, depression who presented to the [facility] ED 09/16/2020 and then admitted to [facility] on 09/20/2020.PTT ordered/consulted to address and assess recent decline in functional mobility. Patient was modified independnet on 09/22/20 PT eval. Now requiores 2 mod/max assist.    PT Comments    Patient progressing gradually with therapy but willing to participate with some education on benefits and encouragement. Pt completed functional LE strengthening exercises with min-mod assist. Repeated cues required to complete each exercise with proper form.  Patient ambulated ~60' with Min-Mod assist and close wheelchair follow. Pt experienced 1 episode of buckling as she fatigued and mod assist needed to prevent LOB. Patient will continue to benefit from skilled PT interventions to progress mobility as able. She remains unsafe to discharge home alone and will benefit from SNF rehab and 24/7 assist.     Follow Up Recommendations  SNF     Equipment Recommendations  None recommended by PT    Recommendations for Other Services       Precautions / Restrictions Precautions Precautions: Fall Precaution Comments: RN reports knees will buckle Restrictions Weight Bearing Restrictions: No    Mobility  Bed Mobility                    Transfers Overall transfer level: Needs assistance Equipment used: Rolling walker (2 wheeled) Transfers: Sit to/from Stand Sit to Stand: Min assist;Mod assist         General transfer comment: Min-Mod assist as pt fatigued for repeated sit<>stands from wheelchair. Pt using single UE for power up and cues needed for safe reach back.  Ambulation/Gait Ambulation/Gait assistance: Min assist;Mod assist;+2 safety/equipment Gait Distance (Feet): 60 Feet Assistive  device: Rolling walker (2 wheeled) Gait Pattern/deviations: Step-to pattern;Shuffle;Trunk flexed;Decreased stride length Gait velocity: decr   General Gait Details: pt with reduced hip/knee flexion and decreased overall step length. pt with 1 episode of slight buckling as she fatiqued and steps became more shuffled.   Stairs             Wheelchair Mobility    Modified Rankin (Stroke Patients Only)       Balance Overall balance assessment: Needs assistance Sitting-balance support: Bilateral upper extremity supported;Feet supported Sitting balance-Leahy Scale: Fair Sitting balance - Comments: able to lean and pull up socks   Standing balance support: During functional activity;Bilateral upper extremity supported Standing balance-Leahy Scale: Poor Standing balance comment: reliant on RW                            Cognition Arousal/Alertness: Awake/alert Behavior During Therapy: WFL for tasks assessed/performed Overall Cognitive Status: Impaired/Different from baseline Area of Impairment: Attention;Memory;Following commands;Safety/judgement;Awareness;Problem solving;Orientation                 Orientation Level: Disoriented to;Time Current Attention Level: Selective Memory: Decreased short-term memory Following Commands: Follows multi-step commands inconsistently;Follows one step commands with increased time Safety/Judgement: Decreased awareness of safety;Decreased awareness of deficits Awareness: Emergent Problem Solving: Slow processing;Decreased initiation;Difficulty sequencing;Requires verbal cues General Comments: multimodal cues for activities, extra time to process      Exercises Other Exercises Other Exercises: 2x5 reps Sit<>Stand from wheelchair, min-mod assist with single UE use for power up. Other Exercises: 10 reps bil LE LAQ Other Exercises: 15 reps  bil LE toe/heel raises.    General Comments        Pertinent Vitals/Pain Pain  Assessment: Faces Faces Pain Scale: Hurts a little bit Pain Location: knees Rt>Lt with exercise and walking Pain Descriptors / Indicators: Sore Pain Intervention(s): Limited activity within patient's tolerance;Monitored during session    Home Living                      Prior Function            PT Goals (current goals can now be found in the care plan section) Acute Rehab PT Goals Patient Stated Goal: I want to get out of here (the hospital) PT Goal Formulation: Patient unable to participate in goal setting Time For Goal Achievement: 10/13/20 Potential to Achieve Goals: Fair Progress towards PT goals: Progressing toward goals    Frequency    Min 1X/week      PT Plan Current plan remains appropriate    Co-evaluation              AM-PAC PT "6 Clicks" Mobility   Outcome Measure  Help needed turning from your back to your side while in a flat bed without using bedrails?: A Little Help needed moving from lying on your back to sitting on the side of a flat bed without using bedrails?: A Lot Help needed moving to and from a bed to a chair (including a wheelchair)?: A Lot Help needed standing up from a chair using your arms (e.g., wheelchair or bedside chair)?: A Lot Help needed to walk in hospital room?: A Lot Help needed climbing 3-5 steps with a railing? : Total 6 Click Score: 12    End of Session Equipment Utilized During Treatment: Gait belt Activity Tolerance: Patient tolerated treatment well Patient left: in chair;with nursing/sitter in room Nurse Communication: Mobility status PT Visit Diagnosis: Unsteadiness on feet (R26.81);Difficulty in walking, not elsewhere classified (R26.2);Muscle weakness (generalized) (M62.81)     Time: 6440-3474 PT Time Calculation (min) (ACUTE ONLY): 23 min  Charges:  $Gait Training: 8-22 mins $Therapeutic Exercise: 8-22 mins                     Wynn Maudlin, DPT Acute Rehabilitation Services Office  [phone] Pager [phone]     Anitra Lauth 10/02/2020, 6:31 PM

## 2020-10-02 NOTE — Progress Notes (Addendum)
   10/02/20 2000  Psych Admission Type (Psych Patients Only)  Admission Status Voluntary  Psychosocial Assessment  Patient Complaints Anxiety  Eye Contact Brief  Facial Expression Anxious  Affect Anxious  Speech Logical/coherent;Slow;Soft  Interaction Assertive  Motor Activity Unsteady;Slow;Shuffling  Appearance/Hygiene Designer, industrial/product Cooperative;Anxious  Mood Anxious  Thought Process  Coherency Concrete thinking  Content WDL  Delusions None reported or observed  Perception WDL  Hallucination None reported or observed  Judgment Limited  Confusion Moderate  Danger to Self  Current suicidal ideation? Denies  Danger to Others  Danger to Others None reported or observed   Pt denies SI, HI, AVH and pain. Pt endorses anxiety 4/10. Says she was up and walking with PT but is tired. Pt still a 2 person assist when she gets up to toilet. Pt still incontinent. Confusion moderate with some cognitive deficit apparent.

## 2020-10-02 NOTE — Progress Notes (Signed)
  1 : 1 note Patient  Continues to be observed 1 to 1.  Patient was being assisted to the edge of the bed to get up as she was sitting she leaned back on her bed and  her head hit the side rail of the bed. No injury noted v/s within normal limit Provider notified no new orders. Patient is alert and oriented denies Pain or discomfort. All fall protocal in place. Patient gait belt and non skid socks on and Patient being assisted two person assist  using her walker. Patient gait is unsteady support and encouragement provided as needed. Patient remains safe. Patient void in the bathroom at this time.

## 2020-10-02 NOTE — Progress Notes (Signed)
Per nursing, the patient has skin breakdown to her bottom. RN requested barrier cream for the patient. Writer spoke with the pharmacist at [facility] who recommends ordering Gerhard's butt cream. Gerhard's butt cream ordered BID PRN as needed to the pt's buttocks.

## 2020-10-03 DIAGNOSIS — F3160 Bipolar disorder, current episode mixed, unspecified: Secondary | ICD-10-CM | POA: Diagnosis not present

## 2020-10-03 LAB — GLUCOSE, CAPILLARY
Glucose-Capillary: 90 mg/dL (ref 70–99)
Glucose-Capillary: 132 mg/dL — ABNORMAL HIGH (ref 70–99)

## 2020-10-03 MED ORDER — DIVALPROEX SODIUM ER 500 MG PO TB24
1000.0000 mg | ORAL_TABLET | Freq: Every day | ORAL | Status: DC
  Administered 2020-10-03 – 2020-10-06 (×4): 1000 mg via ORAL
  Filled 2020-10-03 (×5): qty 2

## 2020-10-03 MED ORDER — BUPROPION HCL ER (XL) 300 MG PO TB24
300.0000 mg | ORAL_TABLET | Freq: Every day | ORAL | Status: DC
  Administered 2020-10-04 – 2020-10-07 (×4): 300 mg via ORAL
  Filled 2020-10-03 (×5): qty 1

## 2020-10-03 NOTE — [facility] Group Notes (Signed)
Type of Therapy and Topic: Group Therapy: Overcoming Obstacles  Participation Level: Active  Description of Group:  In this group patients will watch a Ted Talk titled, "Why We Should All Try Therapy". In this video, patients explore the benefits of going to therapy and how this choice may effect their life. This video also explores other emotions that may arise by attending therapy. Patients were also given a worksheet titled "Therapy Goals" that allows patients to explore their goals for therapy and how their life may be different once they have completed therapy.    Therapeutic Goals:  1. Patient will identify personal and current emotions as they relate to therapy.  2. Patient will identify barriers that currently interfere with them beginning therapy.  3. Patient will identify two goals they are willing to set for therapy.     Summary of Patient Progress: Pt introduced herself but required prompting to do so and stated that she was happy to be alive. Pt slept throughout the rest of group.   Fredirick Lathe, LCSWA Clinicial Social Worker [employer]

## 2020-10-03 NOTE — Progress Notes (Signed)
1:1 note  Pt found in bed; compliant with medication administration. Pt provided hygiene care by outgoing MHT and RN. Pt toileted. Pt denied any physical complaints. Pt denies current si/hi/ah/vh and verbally agrees to approach staff if these become apparent or before harming self/others while at [facility]hh. 1:1 continues for safety. Pt within arms reach of mht. Frequent toileting encouraged. Pt safe on the unit.

## 2020-10-03 NOTE — Progress Notes (Signed)
1:1 note  Pt rested most of morning shift. Pt provided tub bath at 1145. Pt provided care for skin with prescribed medications/treatments. Pt encouraged to ambulate. Pt provided nourishment. Pt safe on the unit. q34m safety checks implemented and continued. 1:1 continues for safety. Gait belt present and secure. MHT within arms reach of pt. Will continue to monitor.

## 2020-10-03 NOTE — Progress Notes (Signed)
Nursing 1:1 note D:Pt observed sleeping in bed with eyes closed. RR even and unlabored. No distress noted. A: 1:1 observation continues for safety  R: Pt remains safe  

## 2020-10-03 NOTE — [facility] Group Notes (Signed)
Pt did not attend morning goals group. 

## 2020-10-03 NOTE — Progress Notes (Signed)
Nursing 1:1 note D:Pt observed laying in bed with eyes closed. RR even and unlabored. No distress noted. This writer coming to room to give pt her nighttime meds. A: 1:1 observation continues for safety  R: Pt remains safe

## 2020-10-03 NOTE — Progress Notes (Addendum)
1:1 note  Pt has been visible in the milieu most of the afternoon. Pt denies any physical complaints. Pt provided nourishment with dinner. Pt safe on the unit. q54m safety checks implemented and continued. Pt encouraged to ambulate. Gait belt present and secure. Sitter within arms reach of pt. Will continue to monitor.

## 2020-10-03 NOTE — Progress Notes (Signed)
[facility] MD Progress Note  10/03/2020 12:00 PM Julie Morrow  MRN:  [id number] Subjective:  Patient is a 70 year old female with a reported past psychiatric history significant for bipolar disorder, anxiety disorder, depression who originally presented to the [facility] emergency department on 09/13/2020 after having been dropped off by her sister. The patient's original complaint was "I cannot take it anymore".  Objective: Patient is seen and examined.  Patient is a 70 year old female with the above-stated past psychiatric history who is seen in follow-up.  She is essentially unchanged today.  She does state that she feels as though she might be more depressed.  That discussion took place after our continued repeat discussions about returning home.  She does not recall the discussions about her inability to return home, the fact that her family cannot assist her with care any longer, and that physically given the fact that she can only get out of bed several times a day and with very little exercise tolerance survived.  We discussed the possibility of increasing her Wellbutrin dosage.  Otherwise she is essentially unchanged.  Her blood sugar this morning is 90.  Her vital signs are stable, she is afebrile.  Pulse oximetry on room air at rest is 97%.  She had a CBC done on 5/19 that was completely normal.  Her platelets were 163,000, and 8 days ago were 160,000.  Her elevated hemoglobin and hematocrit have gone into the normal range with a hemoglobin of 13.2 and hematocrit of 40.1.  Differential was normal.  Her valproic acid level on 5/19 was 34.  She slept 5.75 hours last night.  Principal Problem: Major neurocognitive disorder (HCC) Diagnosis: Principal Problem:   Major neurocognitive disorder (HCC) Active Problems:   HLD (hyperlipidemia)   Hypothyroidism   Bipolar depression (HCC)   Diabetes (HCC)  Total Time spent with patient: 15 minutes  Past Psychiatric History: See  admission H&P  Past Medical History:  Past Medical History:  Diagnosis Date  . Anxiety   . Bipolar 1 disorder (HCC)   . Bradycardia   . Depression   . Diabetes mellitus, type II (HCC)    Patient takes Glucotrol and Januvia  . Hypothyroidism   . Lithium toxicity     Past Surgical History:  Procedure Laterality Date  . ABDOMINAL HYSTERECTOMY    . CHOLECYSTECTOMY     Family History:  Family History  Problem Relation Age of Onset  . Dementia Mother   . Arthritis Father    Family Psychiatric  History: See admission H&P Social History:  Social History   Substance and Sexual Activity  Alcohol Use No  . Alcohol/week: 0.0 standard drinks     Social History   Substance and Sexual Activity  Drug Use No    Social History   Socioeconomic History  . Marital status: Single    Spouse name: Not on file  . Number of children: Not on file  . Years of education: Not on file  . Highest education level: Not on file  Occupational History  . Not on file  Tobacco Use  . Smoking status: Current Every Day Smoker    Packs/day: 0.50    Years: 20.00    Pack years: 10.00    Types: Cigarettes    Start date: 11/06/1984  . Smokeless tobacco: Never Used  Vaping Use  . Vaping Use: Never used  Substance and Sexual Activity  . Alcohol use: No    Alcohol/week: 0.0 standard drinks  .  Drug use: No  . Sexual activity: Not Currently  Other Topics Concern  . Not on file  Social History Narrative  . Not on file   Social Determinants of Health   Financial Resource Strain: Not on file  Food Insecurity: Not on file  Transportation Needs: Not on file  Physical Activity: Not on file  Stress: Not on file  Social Connections: Not on file   Additional Social History:                         Sleep: Good  Appetite:  Good  Current Medications: Current Facility-Administered Medications  Medication Dose Route Frequency Provider Last Rate Last Admin  . acetaminophen (TYLENOL)  tablet 650 mg  650 mg Oral Q6H PRN Clapacs, Jackquline Denmark, MD   650 mg at 09/28/20 2054  . alum & mag hydroxide-simeth (MAALOX/MYLANTA) 200-200-20 MG/5ML suspension 30 mL  30 mL Oral Q4H PRN Clapacs, John T, MD      . aspirin EC tablet 81 mg  81 mg Oral Daily Clapacs, Jackquline Denmark, MD   81 mg at 10/03/20 0754  . benztropine (COGENTIN) tablet 0.5 mg  0.5 mg Oral BID Bartholomew Crews E, MD   0.5 mg at 10/03/20 0755  . [START ON 10/04/2020] buPROPion (WELLBUTRIN XL) 24 hr tablet 300 mg  300 mg Oral Daily Antonieta Pert, MD      . divalproex (DEPAKOTE ER) 24 hr tablet 1,000 mg  1,000 mg Oral QHS Antonieta Pert, MD      . docusate sodium (COLACE) capsule 100 mg  100 mg Oral Daily Mason Jim, Amy E, MD   100 mg at 10/03/20 0754  . fenofibrate tablet 160 mg  160 mg Oral Daily Clapacs, Jackquline Denmark, MD   160 mg at 10/03/20 0755  . Gerhardt's butt cream   Topical BID PRN Layla Barter, NP   Given at 10/03/20 0725  . levothyroxine (SYNTHROID) tablet 175 mcg  175 mcg Oral Q0600 Comer Locket, MD   175 mcg at 10/03/20 0617  . loratadine (CLARITIN) tablet 10 mg  10 mg Oral Daily Clapacs, Jackquline Denmark, MD   10 mg at 10/03/20 0755  . magnesium hydroxide (MILK OF MAGNESIA) suspension 30 mL  30 mL Oral Daily PRN Clapacs, John T, MD      . metFORMIN (GLUCOPHAGE) tablet 500 mg  500 mg Oral Q breakfast Clapacs, Jackquline Denmark, MD   500 mg at 10/03/20 0755  . nicotine (NICODERM CQ - dosed in mg/24 hours) patch 14 mg  14 mg Transdermal Daily Antonieta Pert, MD   14 mg at 10/03/20 0754  . nitrofurantoin (macrocrystal-monohydrate) (MACROBID) capsule 100 mg  100 mg Oral Q12H Mason Jim, Amy E, MD   100 mg at 10/03/20 0754  . nystatin (MYCOSTATIN/NYSTOP) topical powder   Topical TID Laveda Abbe, NP   Given at 10/03/20 [phone]  . polyethylene glycol (MIRALAX / GLYCOLAX) packet 17 g  17 g Oral Daily Bartholomew Crews E, MD   17 g at 10/03/20 0754  . risperiDONE (RISPERDAL) tablet 4 mg  4 mg Oral QHS Comer Locket, MD   4 mg at 10/02/20  2119  . rosuvastatin (CRESTOR) tablet 40 mg  40 mg Oral Daily Comer Locket, MD   40 mg at 10/03/20 0754  . traZODone (DESYREL) tablet 150 mg  150 mg Oral QHS PRN Antonieta Pert, MD   150 mg at 10/02/20 2119  Lab Results:  Results for orders placed or performed during the hospital encounter of 09/19/20 (from the past 48 hour(s))  Urinalysis, Complete w Microscopic Urine, Catheterized     Status: Abnormal   Collection Time: 10/01/20  2:45 PM  Result Value Ref Range   Color, Urine YELLOW YELLOW   APPearance CLEAR CLEAR   Specific Gravity, Urine 1.012 1.005 - 1.030   pH 5.0 5.0 - 8.0   Glucose, UA NEGATIVE NEGATIVE mg/dL   Hgb urine dipstick NEGATIVE NEGATIVE   Bilirubin Urine NEGATIVE NEGATIVE   Ketones, ur NEGATIVE NEGATIVE mg/dL   Protein, ur NEGATIVE NEGATIVE mg/dL   Nitrite NEGATIVE NEGATIVE   Leukocytes,Ua NEGATIVE NEGATIVE   RBC / HPF 0-5 0 - 5 RBC/hpf   WBC, UA 0-5 0 - 5 WBC/hpf   Bacteria, UA RARE (A) NONE SEEN   Squamous Epithelial / LPF 0-5 0 - 5   Mucus PRESENT     Comment: Performed at [facility], [address] [place], [place] [address]  Glucose, capillary     Status: Abnormal   Collection Time: 10/01/20  5:00 PM  Result Value Ref Range   Glucose-Capillary 130 (H) 70 - 99 mg/dL    Comment: Glucose reference range applies only to samples taken after fasting for at least 8 hours.   Comment 1 Notify RN   Valproic acid level     Status: Abnormal   Collection Time: 10/02/20  6:30 AM  Result Value Ref Range   Valproic Acid Lvl 34 (L) 50.0 - 100.0 ug/mL    Comment: Performed at [facility], [address] [place], [place] [address]  CBC with Differential/Platelet     Status: None   Collection Time: 10/02/20  6:30 AM  Result Value Ref Range   WBC 6.6 4.0 - 10.5 K/uL   RBC 4.25 3.87 - 5.11 MIL/uL   Hemoglobin 13.2 12.0 - 15.0 g/dL   HCT 09.840.1 11.936.0 - 14.746.0 %   MCV 94.4 80.0 - 100.0 fL   MCH 31.1 26.0 - 34.0 pg   MCHC  32.9 30.0 - 36.0 g/dL   RDW 82.913.5 56.211.5 - 13.015.5 %   Platelets 163 150 - 400 K/uL   nRBC 0.0 0.0 - 0.2 %   Neutrophils Relative % 55 %   Neutro Abs 3.7 1.7 - 7.7 K/uL   Lymphocytes Relative 33 %   Lymphs Abs 2.2 0.7 - 4.0 K/uL   Monocytes Relative 9 %   Monocytes Absolute 0.6 0.1 - 1.0 K/uL   Eosinophils Relative 2 %   Eosinophils Absolute 0.1 0.0 - 0.5 K/uL   Basophils Relative 1 %   Basophils Absolute 0.0 0.0 - 0.1 K/uL   Immature Granulocytes 0 %   Abs Immature Granulocytes 0.01 0.00 - 0.07 K/uL    Comment: Performed at [facility], [address] [place], [place] [address]  Hepatic function panel     Status: Abnormal   Collection Time: 10/02/20  6:30 AM  Result Value Ref Range   Total Protein 6.0 (L) 6.5 - 8.1 g/dL   Albumin 3.5 3.5 - 5.0 g/dL   AST 15 15 - 41 U/L   ALT 14 0 - 44 U/L   Alkaline Phosphatase 43 38 - 126 U/L   Total Bilirubin 0.5 0.3 - 1.2 mg/dL   Bilirubin, Direct 0.1 0.0 - 0.2 mg/dL   Indirect Bilirubin 0.4 0.3 - 0.9 mg/dL    Comment: Performed at [facility],  [address], [place], [place] [address]  Glucose, capillary     Status: Abnormal   Collection Time: 10/02/20  5:28 PM  Result Value Ref Range   Glucose-Capillary 138 (H) 70 - 99 mg/dL    Comment: Glucose reference range applies only to samples taken after fasting for at least 8 hours.  Glucose, capillary     Status: None   Collection Time: 10/03/20  6:15 AM  Result Value Ref Range   Glucose-Capillary 90 70 - 99 mg/dL    Comment: Glucose reference range applies only to samples taken after fasting for at least 8 hours.    Blood Alcohol level:  Lab Results  Component Value Date   ETH <10 09/16/2020   ETH <10 12/03/2019    Metabolic Disorder Labs: Lab Results  Component Value Date   HGBA1C 6.5 (H) 09/16/2020   MPG 139.85 09/16/2020   MPG 128.37 12/28/2018   Lab Results  Component Value Date   PROLACTIN 15.1 07/03/2015   Lab Results  Component  Value Date   CHOL 106 09/16/2020   TRIG 315 (H) 09/16/2020   HDL 28 (L) 09/16/2020   CHOLHDL 3.8 09/16/2020   VLDL 63 (H) 09/16/2020   LDLCALC 15 09/16/2020   LDLCALC 38 12/28/2018    Physical Findings: AIMS: Facial and Oral Movements Muscles of Facial Expression: None, normal Lips and Perioral Area: None, normal Jaw: None, normal Tongue: None, normal,Extremity Movements Upper (arms, wrists, hands, fingers): None, normal Lower (legs, knees, ankles, toes): None, normal, Trunk Movements Neck, shoulders, hips: None, normal, Overall Severity Severity of abnormal movements (highest score from questions above): None, normal Incapacitation due to abnormal movements: None, normal Patient's awareness of abnormal movements (rate only patient's report): No Awareness, Dental Status Current problems with teeth and/or dentures?: No Does patient usually wear dentures?: No  CIWA:    COWS:     Musculoskeletal: Strength & Muscle Tone: decreased Gait & Station: unsteady Patient leans: N/A  Psychiatric Specialty Exam:  Presentation  General Appearance: Fairly Groomed  Eye Contact:Fair  Speech:Normal Rate  Speech Volume:Normal  Handedness:Right   Mood and Affect  Mood:Depressed  Affect:Flat   Thought Process  Thought Processes:Goal Directed  Descriptions of Associations:Circumstantial  Orientation:Partial  Thought Content:Rumination  History of Schizophrenia/Schizoaffective disorder:No  Duration of Psychotic Symptoms:No data recorded Hallucinations:Hallucinations: None  Ideas of Reference:None  Suicidal Thoughts:Suicidal Thoughts: No  Homicidal Thoughts:Homicidal Thoughts: No   Sensorium  Memory:Immediate Fair; Recent Fair; Remote Fair  Judgment:Impaired  Insight:Lacking   Executive Functions  Concentration:Fair  Attention Span:Fair  Recall:Poor  Fund of Knowledge:Fair; Poor  Language:Fair   Psychomotor Activity  Psychomotor Activity:No data  recorded  Assets  Assets:Desire for Improvement; Resilience   Sleep  Sleep:Sleep: Fair Number of Hours of Sleep: 5.75    Physical Exam: Physical Exam Vitals and nursing note reviewed.  Constitutional:      Appearance: She is obese.  HENT:     Head: Normocephalic and atraumatic.  Pulmonary:     Effort: Pulmonary effort is normal.  Neurological:     Mental Status: She is alert.     Motor: Weakness present.    Review of Systems  Constitutional: Positive for malaise/fatigue.  Musculoskeletal: Positive for back pain, joint pain and myalgias.  Neurological: Positive for focal weakness and weakness.  All other systems reviewed and are negative.  Blood pressure (!) 113/56, pulse 65, temperature 98.3 F (36.8 C), temperature source Oral, resp. rate 16, height 5\' 1"  (1.549 m), weight 105.7 kg, SpO2 97 %.  Body mass index is 44.02 kg/m.   Treatment Plan Summary: Daily contact with patient to assess and evaluate symptoms and progress in treatment, Medication management and Plan : Patient is seen and examined.  Patient is a 70 year old female with the above-stated past psychiatric history who is seen in follow-up.   Diagnosis: 1. Bipolar disorder, most recently depressed 2. Type 2 diabetes mellitus 3. Hypothyroidism 4. Mild cognitive impairment 5. Urinary tract infection  Pertinent findings on examination today: 1.  Perhaps slightly more depressed than previously. 2.  Blood sugar control is good. 3.  Blood pressure control is good. 4.  Repeat urinalysis from 5/18 was completely normal without evidence of white blood cells.  There were rare bacteria.  Leukocytes and nitrate were negative. 5.  Placement issues continue.  Plan: 1.  Continue coated aspirin 81 mg p.o. daily for heart health. 2.  Continue Cogentin 0.5 mg p.o. twice daily for side effects of medications. 3.  Increase Wellbutrin XL to 300 mg p.o. daily for depression and anxiety. 4.  Continue Depakote ER 1000  mg p.o. nightly for mood stability. 5.  Continue Colace 100 mg p.o. daily for constipation. 6.  Continue fenofibrate 160 mg p.o. daily for hypertriglyceridemia. 7.  Continue Gerhardt's hearts butt cream for possible hotspots of skin breakdown. 8.  Patient remains in a special bed to prevent skin breakdown. 9.  Unfortunately I did not decrease her levothyroxine dose yesterday to 150 mcg, but I will do that today. 10.  Continue metformin 500 mg p.o. every morning for diabetes mellitus type 2 11.  Patient was started on antibiotics Macrobid 100 mg p.o. every 12 hours on 09/28/2020.  She has already completed 5 days of treatment, and was written for 7.  Since there was a presence of bacteria we will continue this for 2 more days. 12.  Continue nystatin powder for skin fungal infection. 13.  Continue MiraLAX 17 g 8 ounces of water p.o. daily for constipation. 14.  Continue Risperdal 4 mg p.o. nightly for mood stability and psychosis. 15.  Continue Crestor 40 mg p.o. daily for hyperlipidemia. 16.  Continue trazodone 150 mg p.o. nightly as needed insomnia. 17.  Disposition planning-in progress, placement issues.  Antonieta Pert, MD 10/03/2020, 12:00 PM

## 2020-10-03 NOTE — Plan of Care (Signed)
  Problem: Safety: Goal: Periods of time without injury will increase Outcome: Progressing   Problem: Education: Goal: Utilization of techniques to improve thought processes will improve Outcome: Progressing Goal: Knowledge of the prescribed therapeutic regimen will improve Outcome: Progressing   Problem: Activity: Goal: Interest or engagement in leisure activities will improve Outcome: Progressing

## 2020-10-03 NOTE — Progress Notes (Signed)
Recreation Therapy Notes  Date: 10/03/2020 Time: 900a  Topic: Stress Management   Goal Area(s) Addresses:  Patient will review and complete packet supporting identification of stressors and and techniques to combat compounding stress.   Intervention: Independent Workbook   Education: Stress, Time Management, Coping Skills, Lifestyle Changes, Discharge Planning   Comments: LRT provided pt a workbook reviewing stress management concepts and offering an opportunity to create a plan to address stressors. Pt given the option to complete the packet in the dayroom with RN/MHT staff and peers or at their own pace in their room.   Tashawnda Bleiler, LRT/CTRS 

## 2020-10-04 DIAGNOSIS — F3160 Bipolar disorder, current episode mixed, unspecified: Secondary | ICD-10-CM | POA: Diagnosis not present

## 2020-10-04 LAB — GLUCOSE, CAPILLARY
Glucose-Capillary: 80 mg/dL (ref 70–99)
Glucose-Capillary: 149 mg/dL — ABNORMAL HIGH (ref 70–99)

## 2020-10-04 NOTE — Progress Notes (Signed)
1:1 Nursing Note  D: Patient observed in the dayroom, sitting in her wheel chair eating dinner. MHT sitter present in dayroom A;1:1 observation continued for safety R: Patient remains safe with Q 15 min checks and 1:1 sitter

## 2020-10-04 NOTE — Progress Notes (Signed)
[facility] Group Notes:  (Nursing/MHT/Case Management/Adjunct)  Date:  10/04/2020  Time:  11:04 PM  Type of Therapy:  Group Therapy  Participation Level:  Did Not Attend  Participation Quality:  NA  Affect: NA Cognitive:  NA  Insight:  NA  Engagement in Group:  NA Modes of Intervention:  NA Summary of Progress/Problems:  Lorita Officer 10/04/2020, 11:04 PM

## 2020-10-04 NOTE — Progress Notes (Signed)
Nursing 1:1 note D:Pt assisted to get out of bed by two nurses. RR even and unlabored. No distress noted. A: 1:1 observation continues for safety  R: Pt remains safe

## 2020-10-04 NOTE — Progress Notes (Addendum)
1:1 Nursing note  D: Pt assisted to bathroom per nurse and MHT at the beginning of shift. Redness noted in groin and buttocks- peri-care performed, and cream applied to buttocks. Pt continues to need 2 assist with mobility, and still needs a lot of encouragement to initiate movement. Pt is compliant with meds, denies SI/HI and AVH and pain.  Pt is now lying in bed with eyes closed- appears to be sleeping- respirations even and unlabored. MHT sitter at bedside A: 1:1 observation continues for safety R: Pt remains safe with 1:1 observation and Q 15 min checks

## 2020-10-04 NOTE — [facility] Group Notes (Signed)
.  Psychoeducational Group Note  Date: 10/04/2020 Time: 0900-1000    Goal Setting   Purpose of Group: This group helps to provide patients with the steps of setting a goal that is specific, measurable, attainable, realistic and time specific. A discussion on how we keep ourselves stuck with negative self talk.    Participation Level:  Did not attend   Edrian Melucci A  

## 2020-10-04 NOTE — Progress Notes (Addendum)
   10/03/20 2200  Psych Admission Type (Psych Patients Only)  Admission Status Voluntary  Psychosocial Assessment  Patient Complaints Anxiety  Eye Contact Fair  Facial Expression Anxious  Affect Anxious;Depressed  Speech Logical/coherent;Slow  Interaction Assertive  Motor Activity Shuffling;Slow;Unsteady  Appearance/Hygiene Improved  Behavior Characteristics Cooperative;Appropriate to situation;Anxious  Mood Anxious  Thought Process  Coherency Concrete thinking  Content WDL  Delusions Somatic  Perception WDL  Hallucination None reported or observed  Judgment Limited  Confusion Mild  Danger to Self  Current suicidal ideation? Denies  Danger to Others  Danger to Others None reported or observed   Pt denies SI, HI, AVH and pain. Pt rates anxiety 7/10. Pt attended group. Pt still needs assistance with mobility and will not initiate if not encouraged to. Pt still a 2-3 person assist for movement, bathing and toileting.

## 2020-10-04 NOTE — Progress Notes (Signed)
Nursing 1:1 note D:Pt observed laying in bed with eyes closed and snoring. RR even and unlabored. No distress noted. A: 1:1 observation continues for safety  R: Pt remains safe

## 2020-10-04 NOTE — Progress Notes (Signed)
[facility]  10/04/2020 4:09 PM Julie Morrow  MRN:  [id number] Subjective:  Patient is a 70 year old female with a reported past psychiatric history significant for bipolar disorder, anxiety disorder, depression who originally presented to the [facility] emergency department on 09/13/2020 after having been dropped off by her sister. The patient's original complaint was "I cannot take it anymore".  Objective: Patient is seen and examined.  Patient is a 70 year old female with the above-stated past psychiatric history who is seen in follow-up.  She stated she does not feel depressed today.  The staff is working on trying to get her to move up the bed.  Her strength is still decreased.  Her memory is poor, and still every day ask when she is going home, and we have the same discussion about placement and guardianship.  Yesterday I increased her Wellbutrin XL to 300 mg p.o. daily for depression and anxiety.  We will have to monitor for the possibility of a manic episode.  She has completed 6 days of antibiotic treatment for her urinary tract infection, and she will complete treatment tomorrow.  No new laboratories.  Her vital signs are stable, she is afebrile.  Pulse oximetry on room air was 98% on room air.  She slept 6.25 hours last night.  Principal Problem: Major neurocognitive disorder (HCC) Diagnosis: Principal Problem:   Major neurocognitive disorder (HCC) Active Problems:   HLD (hyperlipidemia)   Hypothyroidism   Bipolar depression (HCC)   Diabetes (HCC)  Total Time spent with patient: 15 minutes  Past Psychiatric History: See admission H&P  Past Medical History:  Past Medical History:  Diagnosis Date  . Anxiety   . Bipolar 1 disorder (HCC)   . Bradycardia   . Depression   . Diabetes mellitus, type II (HCC)    Patient takes Glucotrol and Januvia  . Hypothyroidism   . Lithium toxicity     Past Surgical History:  Procedure Laterality Date  .  ABDOMINAL HYSTERECTOMY    . CHOLECYSTECTOMY     Family History:  Family History  Problem Relation Age of Onset  . Dementia Mother   . Arthritis Father    Family Psychiatric  History: See admission H&P Social History:  Social History   Substance and Sexual Activity  Alcohol Use No  . Alcohol/week: 0.0 standard drinks     Social History   Substance and Sexual Activity  Drug Use No    Social History   Socioeconomic History  . Marital status: Single    Spouse name: Not on file  . Number of children: Not on file  . Years of education: Not on file  . Highest education level: Not on file  Occupational History  . Not on file  Tobacco Use  . Smoking status: Current Every Day Smoker    Packs/day: 0.50    Years: 20.00    Pack years: 10.00    Types: Cigarettes    Start date: 11/06/1984  . Smokeless tobacco: Never Used  Vaping Use  . Vaping Use: Never used  Substance and Sexual Activity  . Alcohol use: No    Alcohol/week: 0.0 standard drinks  . Drug use: No  . Sexual activity: Not Currently  Other Topics Concern  . Not on file  Social History Narrative  . Not on file   Social Determinants of Health   Financial Resource Strain: Not on file  Food Insecurity: Not on file  Transportation Needs: Not on file  Physical  Activity: Not on file  Stress: Not on file  Social Connections: Not on file   Additional Social History:                         Sleep: Good  Appetite:  Good  Current Medications: Current Facility-Administered Medications  Medication Dose Route Frequency Provider Last Rate Last Admin  . acetaminophen (TYLENOL) tablet 650 mg  650 mg Oral Q6H PRN Clapacs, Jackquline Denmark, MD   650 mg at 09/28/20 2054  . alum & mag hydroxide-simeth (MAALOX/MYLANTA) 200-200-20 MG/5ML suspension 30 mL  30 mL Oral Q4H PRN Clapacs, John T, MD      . aspirin EC tablet 81 mg  81 mg Oral Daily Clapacs, Jackquline Denmark, MD   81 mg at 10/04/20 0819  . benztropine (COGENTIN) tablet 0.5  mg  0.5 mg Oral BID Bartholomew Crews E, MD   0.5 mg at 10/04/20 0819  . buPROPion (WELLBUTRIN XL) 24 hr tablet 300 mg  300 mg Oral Daily Antonieta Pert, MD   300 mg at 10/04/20 0819  . divalproex (DEPAKOTE ER) 24 hr tablet 1,000 mg  1,000 mg Oral QHS Antonieta Pert, MD   1,000 mg at 10/03/20 2223  . docusate sodium (COLACE) capsule 100 mg  100 mg Oral Daily Mason Jim, Amy E, MD   100 mg at 10/04/20 0819  . fenofibrate tablet 160 mg  160 mg Oral Daily Clapacs, Jackquline Denmark, MD   160 mg at 10/04/20 0819  . Gerhardt's butt cream   Topical BID PRN Layla Barter, NP   Given at 10/03/20 0725  . levothyroxine (SYNTHROID) tablet 175 mcg  175 mcg Oral Q0600 Comer Locket, MD   175 mcg at 10/04/20 0641  . loratadine (CLARITIN) tablet 10 mg  10 mg Oral Daily Clapacs, Jackquline Denmark, MD   10 mg at 10/04/20 0819  . magnesium hydroxide (MILK OF MAGNESIA) suspension 30 mL  30 mL Oral Daily PRN Clapacs, John T, MD      . metFORMIN (GLUCOPHAGE) tablet 500 mg  500 mg Oral Q breakfast Clapacs, John T, MD   500 mg at 10/04/20 0819  . nicotine (NICODERM CQ - dosed in mg/24 hours) patch 14 mg  14 mg Transdermal Daily Antonieta Pert, MD   14 mg at 10/04/20 0901  . nitrofurantoin (macrocrystal-monohydrate) (MACROBID) capsule 100 mg  100 mg Oral Q12H Mason Jim, Amy E, MD   100 mg at 10/04/20 0819  . nystatin (MYCOSTATIN/NYSTOP) topical powder   Topical TID Laveda Abbe, NP   1 application at 10/04/20 1155  . polyethylene glycol (MIRALAX / GLYCOLAX) packet 17 g  17 g Oral Daily Comer Locket, MD   17 g at 10/04/20 0819  . risperiDONE (RISPERDAL) tablet 4 mg  4 mg Oral QHS Comer Locket, MD   4 mg at 10/03/20 2223  . rosuvastatin (CRESTOR) tablet 40 mg  40 mg Oral Daily Comer Locket, MD   40 mg at 10/04/20 0819  . traZODone (DESYREL) tablet 150 mg  150 mg Oral QHS PRN Antonieta Pert, MD   150 mg at 10/03/20 2223    Lab Results:  Results for orders placed or performed during the hospital encounter  of 09/19/20 (from the past 48 hour(s))  Glucose, capillary     Status: Abnormal   Collection Time: 10/02/20  5:28 PM  Result Value Ref Range   Glucose-Capillary 138 (H) 70 - 99 mg/dL  Comment: Glucose reference range applies only to samples taken after fasting for at least 8 hours.  Glucose, capillary     Status: None   Collection Time: 10/03/20  6:15 AM  Result Value Ref Range   Glucose-Capillary 90 70 - 99 mg/dL    Comment: Glucose reference range applies only to samples taken after fasting for at least 8 hours.  Glucose, capillary     Status: Abnormal   Collection Time: 10/03/20  4:54 PM  Result Value Ref Range   Glucose-Capillary 132 (H) 70 - 99 mg/dL    Comment: Glucose reference range applies only to samples taken after fasting for at least 8 hours.  Glucose, capillary     Status: None   Collection Time: 10/04/20  6:40 AM  Result Value Ref Range   Glucose-Capillary 80 70 - 99 mg/dL    Comment: Glucose reference range applies only to samples taken after fasting for at least 8 hours.    Blood Alcohol level:  Lab Results  Component Value Date   ETH <10 09/16/2020   ETH <10 12/03/2019    Metabolic Disorder Labs: Lab Results  Component Value Date   HGBA1C 6.5 (H) 09/16/2020   MPG 139.85 09/16/2020   MPG 128.37 12/28/2018   Lab Results  Component Value Date   PROLACTIN 15.1 07/03/2015   Lab Results  Component Value Date   CHOL 106 09/16/2020   TRIG 315 (H) 09/16/2020   HDL 28 (L) 09/16/2020   CHOLHDL 3.8 09/16/2020   VLDL 63 (H) 09/16/2020   LDLCALC 15 09/16/2020   LDLCALC 38 12/28/2018    Physical Findings: AIMS: Facial and Oral Movements Muscles of Facial Expression: None, normal Lips and Perioral Area: None, normal Jaw: None, normal Tongue: None, normal,Extremity Movements Upper (arms, wrists, hands, fingers): None, normal Lower (legs, knees, ankles, toes): None, normal, Trunk Movements Neck, shoulders, hips: None, normal, Overall Severity Severity of  abnormal movements (highest score from questions above): None, normal Incapacitation due to abnormal movements: None, normal Patient's awareness of abnormal movements (rate only patient's report): No Awareness, Dental Status Current problems with teeth and/or dentures?: No Does patient usually wear dentures?: No  CIWA:    COWS:     Musculoskeletal: Strength & Muscle Tone: decreased Gait & Station: unsteady Patient leans: N/A  Psychiatric Specialty Exam:  Presentation  General Appearance: Fairly Groomed  Eye Contact:Fair  Speech:Normal Rate  Speech Volume:Normal  Handedness:Right   Mood and Affect  Mood:Euthymic  Affect:Appropriate   Thought Process  Thought Processes:Goal Directed  Descriptions of Associations:Circumstantial  Orientation:Partial  Thought Content:Logical  History of Schizophrenia/Schizoaffective disorder:Yes  Duration of Psychotic Symptoms:No data recorded Hallucinations:Hallucinations: None  Ideas of Reference:None  Suicidal Thoughts:Suicidal Thoughts: No  Homicidal Thoughts:Homicidal Thoughts: No   Sensorium  Memory:Immediate Fair; Recent Fair; Remote Fair  Judgment:Poor  Insight:Lacking   Executive Functions  Concentration:Fair  Attention Span:Fair  Recall:Poor  Fund of Knowledge:Poor  Language:Fair   Psychomotor Activity  Psychomotor Activity:Psychomotor Activity: Decreased   Assets  Assets:Desire for Improvement; Resilience   Sleep  Sleep:Sleep: Good Number of Hours of Sleep: 6.25    Physical Exam: Physical Exam Vitals and nursing note reviewed.  Constitutional:      Appearance: She is obese.  HENT:     Head: Normocephalic and atraumatic.  Pulmonary:     Effort: Pulmonary effort is normal.  Neurological:     General: No focal deficit present.     Mental Status: She is alert.    Review of Systems  Musculoskeletal: Positive for back pain and myalgias.  Neurological: Positive for tingling and  weakness.  All other systems reviewed and are negative.  Blood pressure 122/86, pulse 62, temperature (!) 97.5 F (36.4 C), temperature source Oral, resp. rate 18, height 5\' 1"  (1.549 m), weight 105.7 kg, SpO2 98 %. Body mass index is 44.02 kg/m.   Treatment Plan Summary: Daily contact with patient to assess and evaluate symptoms and progress in treatment, Medication management and Plan : Patient is seen and examined.  Patient is a 70 year old female with the above-stated past psychiatric history who is seen in follow-up.   Diagnosis: 1. Bipolar disorder, most recently depressed 2. Type 2 diabetes mellitus 3. Hypothyroidism 4. Mild cognitive impairment 5. Urinary tract infection  Pertinent findings on examination today: 1.  Patient denies any depressive symptoms today. 2.  Patient remains physically deconditioned and very difficult to do anything for herself. 3.  Patient will complete antibiotics for urinary tract infection tomorrow. 4.  Blood sugar control is good. 5.  Blood pressure control is good. 6.  No suicidal ideation, no evidence of mania.    Plan: 1.  Continue coated aspirin 81 mg p.o. daily for heart health. 2.  Continue Cogentin 0.5 mg p.o. twice daily for side effects of medications. 3.  Continue Wellbutrin XL to 300 mg p.o. daily for depression and anxiety. 4.  Continue Depakote ER 1000 mg p.o. nightly for mood stability. 5.  Continue Colace 100 mg p.o. daily for constipation. 6.  Continue fenofibrate 160 mg p.o. daily for hypertriglyceridemia. 7.  Continue Gerhardt's hearts butt cream for possible hotspots of skin breakdown. 8.  Patient remains in a special bed to prevent skin breakdown. 9.    Continue levothyroxine 150 mcg p.o. daily for hypothyroidism. 10.  Continue metformin 500 mg p.o. every morning for diabetes mellitus type 2 11.  Patient was started on antibiotics Macrobid 100 mg p.o. every 12 hours on 09/28/2020.  She has already completed 5 days of  treatment, and was written for 7.  Since there was a presence of bacteria we will continue this for 2 more days. 12.  Continue nystatin powder for skin fungal infection. 13.  Continue MiraLAX 17 g 8 ounces of water p.o. daily for constipation. 14.  Continue Risperdal 4 mg p.o. nightly for mood stability and psychosis. 15.  Continue Crestor 40 mg p.o. daily for hyperlipidemia. 16.  Continue trazodone 150 mg p.o. nightly as needed insomnia. 17.  Disposition planning-in progress, placement issues.  09/30/2020, MD 10/04/2020, 4:09 PM

## 2020-10-04 NOTE — Progress Notes (Signed)
1:1 Nursing Note  D: Patient sat in the dayroom with sitter for lunch, and reportedly ate well. After lunch, pt was assisted by this nurse and MHT to the bathroom, then helped back into bed per pt's request.  A: 1: observation continued for safety R: Pt remains safe with Q 15 min checks and 1:1 sitter.

## 2020-10-04 NOTE — Progress Notes (Signed)
1:1 Nursing note - Patient observed lying in bed resting. Writer entered to give her hs medications. She was encouraged to use the restroom and was assisted to the bathroom by Clinical research associate and her sitter. She returned to bed to rest. Patient is safe with sitter at bedside, 1:1 continues and patient is safe.

## 2020-10-05 DIAGNOSIS — F0390 Unspecified dementia without behavioral disturbance: Secondary | ICD-10-CM | POA: Diagnosis not present

## 2020-10-05 LAB — GLUCOSE, CAPILLARY
Glucose-Capillary: 91 mg/dL (ref 70–99)
Glucose-Capillary: 90 mg/dL (ref 70–99)
Glucose-Capillary: 99 mg/dL (ref 70–99)
Glucose-Capillary: 99 mg/dL (ref 70–99)

## 2020-10-05 MED ORDER — WHITE PETROLATUM EX OINT
TOPICAL_OINTMENT | CUTANEOUS | Status: AC
  Administered 2020-10-05: 1
  Filled 2020-10-05: qty 15

## 2020-10-05 MED ORDER — RIVAROXABAN 20 MG PO TABS: 20.0000 mg | ORAL_TABLET | Freq: Every day | ORAL | Status: DC

## 2020-10-05 MED ORDER — BISACODYL 5 MG PO TBEC
5.0000 mg | DELAYED_RELEASE_TABLET | Freq: Every day | ORAL | Status: DC
  Administered 2020-10-05 – 2020-10-06 (×2): 5 mg via ORAL
  Filled 2020-10-05 (×3): qty 1

## 2020-10-05 MED ORDER — HYDROCORTISONE 1 % EX CREA: TOPICAL_CREAM | Freq: Three times a day (TID) | CUTANEOUS | Status: DC

## 2020-10-05 MED ORDER — HYDROCORTISONE (PERIANAL) 2.5 % EX CREA
TOPICAL_CREAM | Freq: Three times a day (TID) | CUTANEOUS | Status: DC
  Administered 2020-10-05: 1 via RECTAL
  Filled 2020-10-05 (×2): qty 28.35

## 2020-10-05 MED ORDER — RIVAROXABAN 15 MG PO TABS
15.0000 mg | ORAL_TABLET | Freq: Two times a day (BID) | ORAL | Status: DC
  Administered 2020-10-05 – 2020-10-07 (×4): 15 mg via ORAL
  Filled 2020-10-05 (×8): qty 1

## 2020-10-05 NOTE — Progress Notes (Signed)
1:1  Pt. In dayroom watched movie following group. Pt. Drank ginger ale and had chips. Pt. Was toileted and could not tolerate standing more than a minute before she would just drop her weight. Pt. Put in bed laying on her left side to take the pressure off her buttocks. Pt. Is more than a 2 person assist, she will just drop her full weight. Pt. Is unable to assist moving up in bed.  Pt. Complained of right leg pain. 1:1 continues. Safety maintained.
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
## 2020-10-05 NOTE — Progress Notes (Signed)
Adult Psychoeducational Group Note  Date:  10/05/2020 Time:  8:57 PM  Group Topic/Focus:  Wrap-Up Group:   The focus of this group is to help patients review their daily goal of treatment and discuss progress on daily workbooks.  Participation Level:  Did Not Attend  Participation Quality:Did not attend     Affect:  Did not attend   Cognitive:  Did not attend   Insight: None  Engagement in Group:  Did not attend   Modes of Intervention:  Did not attend   Additional Comments:  Patient did not attend wrap up group     Nicoletta Dress 10/05/2020, 8:57 PM

## 2020-10-05 NOTE — Progress Notes (Signed)
1:1 Nursing note - Patient was observed lying in bed asleep and writer woke her to give her am medication. She slept well last night and voiced no complaints. 1:1 continues and patient is safe.

## 2020-10-05 NOTE — Progress Notes (Addendum)
1:1 Nursing note - Patient is currently lying in bed asleep. She has been assisted 3 times to the toilet, given hs medications and snack. She has attempted to assist staff when being toileted but can stand briefly before reporting she needs to sit down. Writer has had to spend a lot of time in her room to assist sitter with patient. She can be impulsive at times and will lean forward while sitting in the wheelchair. Writer spoke with her about the possibility of falling onto the floor by doing this. Writer is not certain if patient understood what was discussed with her. She has been pleasant and cooperative. 1:1 continues and sitter at bedside for patient safety.

## 2020-10-05 NOTE — Progress Notes (Signed)
1:1 Nursing note -Patient lying in bed asleep no distress noted. 1:1 continues with sitter at bedside.

## 2020-10-05 NOTE — [facility] Group Notes (Signed)
Adult Psychoeducational Group Not Date:  10/05/2020 Time:  9485-4627 Group Topic/Focus: PROGRESSIVE RELAXATION. A group where deep breathing is taught and tensing and relaxation muscle groups is used. Imagery is used as well.  Pts are asked to imagine 3 pillars that hold them up when they are not able to hold themselves up.  Participation Level:  Active  Participation Quality:  Appropriate  Affect:  Appropriate  Cognitive:  Oriented  Insight: Improving  Engagement in Group:  Engaged  Modes of Intervention:  Activity, Discussion, Education, and Support  Additional Comments:  Pt rates her energy at a 9/10. States God holds her up.  Dione Housekeeper

## 2020-10-05 NOTE — Progress Notes (Signed)
   10/05/20 2200  COVID-19 Daily Checkoff  Have you had a fever (temp > 37.80C/100F)  in the past 24 hours?  No  If you have had runny nose, nasal congestion, sneezing in the past 24 hours, has it worsened? No  COVID-19 EXPOSURE  Have you traveled outside the state in the past 14 days? No  Have you been in contact with someone with a confirmed diagnosis of COVID-19 or PUI in the past 14 days without wearing appropriate PPE? No  Have you been living in the same home as a person with confirmed diagnosis of COVID-19 or a PUI (household contact)? No  Have you been diagnosed with COVID-19? No   

## 2020-10-05 NOTE — [facility] Group Notes (Signed)
Psychoeducational Group Note  Date: 10-05-20 Time:  1300  Group Topic/Focus:  Making Healthy Choices:   The focus of this group is to help patients identify negative/unhealthy choices they were using prior to admission and identify positive/healthier coping strategies to replace them upon discharge.In this group, patients started asking about the brain and how the brain works with and how the chemicals work for those who use substances, the pros and cons of saboxone.  Participation Level:  listened  Participation Quality: listened  Affect:  Flat  Cognitive:  Oriented  Insight:  Unable to assess  Engagement in Group:  Engaged by listening to what was being said  Additional Comments: Pt rates her energy at a 0/10. Paid attention throughout the group but did not add to the conversation.  Dione Housekeeper

## 2020-10-05 NOTE — Progress Notes (Signed)
1:1  Pt. Up and ate breakfast in te 400 dayroom. Pt. Walked from the 400 day room to 300 dayroom with 2 MHTS and Nurse. Pt. Had wheel chair, walker and gaitbelt. Pt. Watch TV, went to Goals group. Pt. Was toileted after goals group and then came back to bed. Nystatin powder applied under breasts. RN present. Safety maintained. 1:1 continues.

## 2020-10-05 NOTE — [facility] Group Notes (Signed)
Adult Psychoeducational Group Not Date:  10/05/2020 Time:  0131-4388 Group Topic/Focus: PROGRESSIVE RELAXATION. A group where deep breathing is taught and tensing and relaxation muscle groups is used. Imagery is used as well.  Pts are asked to imagine 3 pillars that hold them up when they are not able to hold themselves up.  Participation Level:  Active  Participation Quality:  Appropriate  Affect:  Appropriate  Cognitive:  Oriented  Insight: Improving  Engagement in Group:  Engaged  Modes of Intervention:  Activity, Discussion, Education, and Support  Additional Comments:  Rates her energy as a 9/10. States it's God that holds her up.  Dione Housekeeper

## 2020-10-05 NOTE — Progress Notes (Addendum)
   10/05/20 0617  Vital Signs  Temp (!) 97.5 F (36.4 C)  Temp Source Oral  Pulse Rate 71  BP (!) 153/64  BP Location Left Arm  BP Method Automatic  Patient Position (if appropriate) Lying  Oxygen Therapy  SpO2 92 %   D: Patient denies SI/HI/AVH. Patient denies anxiety but rated depression 7/10. Pt. Attended  2 groups, goals group and relation group. Pt. Was toileted x2 . Pt. Had a large amount of stool with bright red blood on the tissue, toilet seat and in the stool. Pt. Was cleaned and barrier was applied. Pt. Has walked x2 this morning. Pt. Is unable to clean herself after she uses the toilet.  A:  Patient took scheduled medicine.  Support and encouragement provided Routine safety checks conducted every 15 minutes. Patient  Informed to notify staff with any concerns.   R: 1:1  Continues. Safety maintained.  Late note @1600  Pt. Is physically weaker than last week.Pt. complained about right leg pain and weakness. Right leg is more swollen then left leg. Pt. Is unable to walk as far as she was earlier in the week and will only tolerate standing for less than one minute before she drops her weight. Pt.does not know when she needs to use the bathroom and needs to be toileted on schedule.  Pt is unable to clean herself after using the toilet and is totally dependent. Pt. Is  total care; pt. Is unable to reposition herself in the bed; it takes 2 people to adjust her in bed.  Pt. May need the use of a mechanical lift as her body is physically heavy and awkward making safe and proper lifting difficult.  Pt is sometimes clear in cognition and sometimes pt. Does not make sense. Pt. Knew the president was , but thought it was the month of July.

## 2020-10-05 NOTE — Progress Notes (Signed)
1:1 Pt up in the 400 day room eating dinner RN present. Pt got labs. Safety maintained. 1:1 continues.

## 2020-10-05 NOTE — [facility] Group Notes (Signed)
Adult Psychoeducational Group Not Date:  10/05/2020 Time:  0940-7680 Group Topic/Focus: PROGRESSIVE RELAXATION. A group where deep breathing is taught and tensing and relaxation muscle groups is used. Imagery is used as well.  Pts are asked to imagine 3 pillars that hold them up when they are not able to hold themselves up.  Participation Level:  Active  Participation Quality:  Appropriate  Affect:  Appropriate  Cognitive:  Oriented  Insight: Improving  Engagement in Group:  Engaged  Modes of Intervention:  Activity, Discussion, Education, and Support  Additional Comments:  States that her energy is a 9/10. States God holds her up  Dione Housekeeper

## 2020-10-06 DIAGNOSIS — F3160 Bipolar disorder, current episode mixed, unspecified: Secondary | ICD-10-CM | POA: Diagnosis not present

## 2020-10-06 LAB — CBC WITH DIFFERENTIAL/PLATELET
Abs Immature Granulocytes: 0.03 10*3/uL (ref 0.00–0.07)
Basophils Absolute: 0 10*3/uL (ref 0.0–0.1)
Basophils Relative: 0 %
Eosinophils Absolute: 0.1 10*3/uL (ref 0.0–0.5)
Eosinophils Relative: 2 %
HCT: 42.2 % (ref 36.0–46.0)
Hemoglobin: 13.7 g/dL (ref 12.0–15.0)
Immature Granulocytes: 0 %
Lymphocytes Relative: 27 %
Lymphs Abs: 1.9 10*3/uL (ref 0.7–4.0)
MCH: 31.1 pg (ref 26.0–34.0)
MCHC: 32.5 g/dL (ref 30.0–36.0)
MCV: 95.9 fL (ref 80.0–100.0)
Monocytes Absolute: 0.7 10*3/uL (ref 0.1–1.0)
Monocytes Relative: 10 %
Neutro Abs: 4.2 10*3/uL (ref 1.7–7.7)
Neutrophils Relative %: 61 %
Platelets: 167 10*3/uL (ref 150–400)
RBC: 4.4 MIL/uL (ref 3.87–5.11)
RDW: 13.5 % (ref 11.5–15.5)
WBC: 7 10*3/uL (ref 4.0–10.5)
nRBC: 0 % (ref 0.0–0.2)

## 2020-10-06 LAB — BRAIN NATRIURETIC PEPTIDE: B Natriuretic Peptide: 39.7 pg/mL (ref 0.0–100.0)

## 2020-10-06 LAB — GLUCOSE, CAPILLARY: Glucose-Capillary: 96 mg/dL (ref 70–99)

## 2020-10-06 LAB — PROTIME-INR
INR: 1.2 (ref 0.8–1.2)
Prothrombin Time: 14.8 seconds (ref 11.4–15.2)

## 2020-10-06 NOTE — [facility] Group Notes (Signed)
LCSW Group Therapy Note 10/06/2020 1:15pm  Type of Therapy and Topic:  Group Therapy:  Setting Goals  Participation Level:  Minimal  Description of Group: In this process group, patients discussed using strengths to work toward goals and address challenges.  Patients identified two positive things about themselves and one goal they were working on.  Patients were given the opportunity to share openly and support each other's plan for self-empowerment.  The group discussed the value of gratitude and were encouraged to have a daily reflection of positive characteristics or circumstances.  Patients were encouraged to identify a plan to utilize their strengths to work on current challenges and goals.  Therapeutic Goals 1. Patient will verbalize personal strengths/positive qualities and relate how these can assist with achieving desired personal goals 2. Patients will verbalize affirmation of peers plans for personal change and goal setting 3. Patients will explore the value of gratitude and positive focus as related to successful achievement of goals 4. Patients will verbalize a plan for regular reinforcement of personal positive qualities and circumstances.  Summary of Patient Progress:  Pt only shared during introductions.     Therapeutic Modalities Cognitive Behavioral Therapy Motivational Interviewing    Julie Morrow 10/06/2020 1:45 PM

## 2020-10-06 NOTE — Progress Notes (Signed)
1:1  Patient eating in the 300 dayroom watching tv with other pts. MHT. Present. Safety maintained. 1:1 continues.

## 2020-10-06 NOTE — Progress Notes (Addendum)
Pt visible on the unit in wheelchair. Pt appeared confused at times. Pt stated her anxiety was  6 out of 10 , 10 being the worst. Pt given PRN Trazodone per [facility] with HS medication     10/06/20 2200  Psych Admission Type (Psych Patients Only)  Admission Status Voluntary  Psychosocial Assessment  Patient Complaints Suspiciousness  Eye Contact Fair  Facial Expression Anxious  Affect Anxious;Depressed  Speech Logical/coherent;Slow  Interaction Assertive  Motor Activity Shuffling;Slow;Unsteady  Appearance/Hygiene Improved  Behavior Characteristics Resistant to care  Mood Depressed;Pleasant  Thought Process  Coherency Concrete thinking  Content WDL  Delusions Somatic  Perception WDL  Hallucination None reported or observed  Judgment Limited  Confusion Mild  Danger to Self  Current suicidal ideation? Denies  Danger to Others  Danger to Others None reported or observed

## 2020-10-06 NOTE — Progress Notes (Signed)
Recreation Therapy Notes  Date:  5.23.22 Time: 0930 Location: 300 Hall Dayroom  Group Topic: Stress Management  Goal Area(s) Addresses:  Patient will identify positive stress management techniques. Patient will identify benefits of using stress management post d/c.  Behavioral Response: Attentive  Intervention: Stress Management  Activity:  Meditation.  LRT played a meditation that focused on calming anxiety.  Patients were to listen and follow along as meditation played to fully engage in activity.  Education:  Stress Management, Discharge Planning.   Education Outcome: Acknowledges Education  Clinical Observations/Feedback: Pt attended and participated.  Pt expressed no concerns or questions.    Caroll Rancher, LRT/CTRS         Caroll Rancher A 10/06/2020 12:07 PM

## 2020-10-06 NOTE — Progress Notes (Signed)
[facility]  10/06/2020 11:56 AM Julie DamesSusan B Morrow  MRN:  [id number] Subjective:  Patient is a 70 year old female with a reported past psychiatric history significant for bipolar disorder, anxiety disorder, depression who originally presented to the [facility] emergency department on 09/13/2020 after having been dropped off by her sister. The patient's original complaint was "I cannot take it anymore".  Objective: Patient is seen and examined.  Patient is a 70 year old female with the above-stated past psychiatric history who is seen in follow-up.  She is essentially unchanged from yesterday.  Events from the last 24 hours included her bleeding hemorrhoids.  That is being treated.  Also because of her continued immobility I have readded her Xarelto.  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  Her CBC from a couple of days ago had shown that her hemoglobin and hematocrit had dropped, and that was repeated.  Her hemoglobin and hematocrit today are 13.7 and 42.2 respectively.  Platelets were 167,000.  She was 160,011 days ago.  I did discuss with nursing today to monitor for any significant bleeding with the addition of the Xarelto.  I think at this point the risk-benefit ratio given her immobility is my concern for emboli.  We continue to attempt to get her placed someplace.  I also checked her BNP given a history of heart failure and it was only 39.7.  Her PT and INR were essentially normal with a PT of 14.8 and INR 1.2.  Her blood pressure was 107/52, repeat was 124/54.  She is afebrile.  Pulse was between 54 and 68.  Pulse oximetry from yesterday was 92% on room air.  She slept 6.25 hours last night.  Her blood sugar this morning is 96.  Principal Problem: Major neurocognitive disorder (HCC) Diagnosis: Principal Problem:   Major neurocognitive disorder (HCC) Active Problems:   HLD (hyperlipidemia)   Hypothyroidism   Bipolar depression (HCC)    Diabetes (HCC)  Total Time spent with patient: 20 minutes  Past Psychiatric History: See admission H&P  Past Medical History:  Past Medical History:  Diagnosis Date  . Anxiety   . Bipolar 1 disorder (HCC)   . Bradycardia   . Depression   . Diabetes mellitus, type II (HCC)    Patient takes Glucotrol and Januvia  . Hypothyroidism   . Lithium toxicity     Past Surgical History:  Procedure Laterality Date  . ABDOMINAL HYSTERECTOMY    . CHOLECYSTECTOMY     Family History:  Family History  Problem Relation Age of Onset  . Dementia Mother   . Arthritis Father    Family Psychiatric  History: See admission H&P Social History:  Social History   Substance and Sexual Activity  Alcohol Use No  . Alcohol/week: 0.0 standard drinks     Social History   Substance and Sexual Activity  Drug Use No    Social History   Socioeconomic History  . Marital status: Single    Spouse name: Not on file  . Number of children: Not on file  . Years of education: Not on file  . Highest education level: Not on file  Occupational History  . Not on file  Tobacco Use  . Smoking status: Current Every Day Smoker    Packs/day: 0.50    Years: 20.00    Pack years: 10.00    Types: Cigarettes    Start date: 11/06/1984  . Smokeless tobacco: Never Used  Vaping Use  . Vaping Use: Never used  Substance and Sexual Activity  . Alcohol use: No    Alcohol/week: 0.0 standard drinks  . Drug use: No  . Sexual activity: Not Currently  Other Topics Concern  . Not on file  Social History Narrative  . Not on file   Social Determinants of Health   Financial Resource Strain: Not on file  Food Insecurity: Not on file  Transportation Needs: Not on file  Physical Activity: Not on file  Stress: Not on file  Social Connections: Not on file   Additional Social History:                         Sleep: Good  Appetite:  Good  Current Medications: Current Facility-Administered Medications   Medication Dose Route Frequency Provider Last Rate Last Admin  . acetaminophen (TYLENOL) tablet 650 mg  650 mg Oral Q6H PRN Clapacs, Jackquline Denmark, MD   650 mg at 10/05/20 1238  . alum & mag hydroxide-simeth (MAALOX/MYLANTA) 200-200-20 MG/5ML suspension 30 mL  30 mL Oral Q4H PRN Clapacs, John T, MD      . aspirin EC tablet 81 mg  81 mg Oral Daily Clapacs, Jackquline Denmark, MD   81 mg at 10/06/20 0824  . benztropine (COGENTIN) tablet 0.5 mg  0.5 mg Oral BID Bartholomew Crews E, MD   0.5 mg at 10/06/20 0825  . bisacodyl (DULCOLAX) EC tablet 5 mg  5 mg Oral QHS Antonieta Pert, MD   5 mg at 10/05/20 2028  . buPROPion (WELLBUTRIN XL) 24 hr tablet 300 mg  300 mg Oral Daily Antonieta Pert, MD   300 mg at 10/06/20 2778  . divalproex (DEPAKOTE ER) 24 hr tablet 1,000 mg  1,000 mg Oral QHS Antonieta Pert, MD   1,000 mg at 10/05/20 2028  . fenofibrate tablet 160 mg  160 mg Oral Daily Clapacs, Jackquline Denmark, MD   160 mg at 10/06/20 0825  . Gerhardt's butt cream   Topical BID PRN White, Patrice L, NP   1 application at 10/06/20 0534  . hydrocortisone (ANUSOL-HC) 2.5 % rectal cream   Rectal TID Antonieta Pert, MD   1 application at 10/05/20 2140  . levothyroxine (SYNTHROID) tablet 175 mcg  175 mcg Oral Q0600 Comer Locket, MD   175 mcg at 10/06/20 0533  . loratadine (CLARITIN) tablet 10 mg  10 mg Oral Daily Clapacs, Jackquline Denmark, MD   10 mg at 10/06/20 0824  . magnesium hydroxide (MILK OF MAGNESIA) suspension 30 mL  30 mL Oral Daily PRN Clapacs, John T, MD      . metFORMIN (GLUCOPHAGE) tablet 500 mg  500 mg Oral Q breakfast Clapacs, Jackquline Denmark, MD   500 mg at 10/06/20 0829  . nicotine (NICODERM CQ - dosed in mg/24 hours) patch 14 mg  14 mg Transdermal Daily Antonieta Pert, MD   14 mg at 10/06/20 0827  . nystatin (MYCOSTATIN/NYSTOP) topical powder   Topical TID Laveda Abbe, NP   Given at 10/06/20 0827  . polyethylene glycol (MIRALAX / GLYCOLAX) packet 17 g  17 g Oral Daily Bartholomew Crews E, MD   17 g at 10/06/20 0827   . risperiDONE (RISPERDAL) tablet 4 mg  4 mg Oral QHS Comer Locket, MD   4 mg at 10/05/20 2028  . Rivaroxaban (XARELTO) tablet 15 mg  15 mg Oral BID WC Antonieta Pert, MD   15  mg at 10/06/20 7867   Followed by  . [START ON 10/26/2020] rivaroxaban (XARELTO) tablet 20 mg  20 mg Oral Q supper Antonieta Pert, MD      . rosuvastatin (CRESTOR) tablet 40 mg  40 mg Oral Daily Comer Locket, MD   40 mg at 10/06/20 6720  . traZODone (DESYREL) tablet 150 mg  150 mg Oral QHS PRN Antonieta Pert, MD   150 mg at 10/03/20 2223    Lab Results:  Results for orders placed or performed during the hospital encounter of 09/19/20 (from the past 48 hour(s))  Glucose, capillary     Status: Abnormal   Collection Time: 10/04/20  6:45 PM  Result Value Ref Range   Glucose-Capillary 149 (H) 70 - 99 mg/dL    Comment: Glucose reference range applies only to samples taken after fasting for at least 8 hours.  Glucose, capillary     Status: None   Collection Time: 10/05/20  5:59 AM  Result Value Ref Range   Glucose-Capillary 91 70 - 99 mg/dL    Comment: Glucose reference range applies only to samples taken after fasting for at least 8 hours.  Glucose, capillary     Status: None   Collection Time: 10/05/20 11:48 AM  Result Value Ref Range   Glucose-Capillary 90 70 - 99 mg/dL    Comment: Glucose reference range applies only to samples taken after fasting for at least 8 hours.  Glucose, capillary     Status: None   Collection Time: 10/05/20  4:59 PM  Result Value Ref Range   Glucose-Capillary 99 70 - 99 mg/dL    Comment: Glucose reference range applies only to samples taken after fasting for at least 8 hours.  Glucose, capillary     Status: None   Collection Time: 10/05/20  5:51 PM  Result Value Ref Range   Glucose-Capillary 99 70 - 99 mg/dL    Comment: Glucose reference range applies only to samples taken after fasting for at least 8 hours.  Glucose, capillary     Status: None   Collection Time:  10/06/20  5:36 AM  Result Value Ref Range   Glucose-Capillary 96 70 - 99 mg/dL    Comment: Glucose reference range applies only to samples taken after fasting for at least 8 hours.  Brain natriuretic peptide     Status: None   Collection Time: 10/06/20  7:21 AM  Result Value Ref Range   B Natriuretic Peptide 39.7 0.0 - 100.0 pg/mL    Comment: Performed at [facility], [address], [place], [place] [address]  Protime-INR     Status: None   Collection Time: 10/06/20  7:21 AM  Result Value Ref Range   Prothrombin Time 14.8 11.4 - 15.2 seconds   INR 1.2 0.8 - 1.2    Comment: (NOTE) INR goal varies based on device and disease states. Performed at [facility], [address], [place], [place] [address]   CBC with Differential/Platelet     Status: None   Collection Time: 10/06/20  7:21 AM  Result Value Ref Range   WBC 7.0 4.0 - 10.5 K/uL   RBC 4.40 3.87 - 5.11 MIL/uL   Hemoglobin 13.7 12.0 - 15.0 g/dL   HCT 62.9 47.6 - 54.6 %   MCV 95.9 80.0 - 100.0 fL   MCH 31.1 26.0 - 34.0 pg   MCHC 32.5 30.0 - 36.0 g/dL   RDW 50.3 54.6 - 56.8 %  Platelets 167 150 - 400 K/uL   nRBC 0.0 0.0 - 0.2 %   Neutrophils Relative % 61 %   Neutro Abs 4.2 1.7 - 7.7 K/uL   Lymphocytes Relative 27 %   Lymphs Abs 1.9 0.7 - 4.0 K/uL   Monocytes Relative 10 %   Monocytes Absolute 0.7 0.1 - 1.0 K/uL   Eosinophils Relative 2 %   Eosinophils Absolute 0.1 0.0 - 0.5 K/uL   Basophils Relative 0 %   Basophils Absolute 0.0 0.0 - 0.1 K/uL   Immature Granulocytes 0 %   Abs Immature Granulocytes 0.03 0.00 - 0.07 K/uL    Comment: Performed at [facility], [address], [name], [place] [address]    Blood Alcohol level:  Lab Results  Component Value Date   [facility] <10 09/16/2020   ETH <10 12/03/2019    Metabolic Disorder Labs: Lab Results  Component Value Date   HGBA1C 6.5 (H) 09/16/2020   MPG 139.85 09/16/2020   MPG 128.37 12/28/2018   Lab  Results  Component Value Date   PROLACTIN 15.1 07/03/2015   Lab Results  Component Value Date   CHOL 106 09/16/2020   TRIG 315 (H) 09/16/2020   HDL 28 (L) 09/16/2020   CHOLHDL 3.8 09/16/2020   VLDL 63 (H) 09/16/2020   LDLCALC 15 09/16/2020   LDLCALC 38 12/28/2018    Physical Findings: AIMS: Facial and Oral Movements Muscles of Facial Expression: None, normal Lips and Perioral Area: None, normal Jaw: None, normal Tongue: None, normal,Extremity Movements Upper (arms, wrists, hands, fingers): None, normal Lower (legs, knees, ankles, toes): None, normal, Trunk Movements Neck, shoulders, hips: None, normal, Overall Severity Severity of abnormal movements (highest score from questions above): None, normal Incapacitation due to abnormal movements: None, normal Patient's awareness of abnormal movements (rate only patient's report): No Awareness, Dental Status Current problems with teeth and/or dentures?: No Does patient usually wear dentures?: No  CIWA:    COWS:     Musculoskeletal: Strength & Muscle Tone: decreased Gait & Station: unable to stand Patient leans: N/A  Psychiatric Specialty Exam:  Presentation  General Appearance: Fairly Groomed  Eye Contact:Fair  Speech:Normal Rate  Speech Volume:Normal  Handedness:Right   Mood and Affect  Mood:Euthymic  Affect:Congruent   Thought Process  Thought Processes:Goal Directed  Descriptions of Associations:Circumstantial  Orientation:Partial  Thought Content:Rumination  History of Schizophrenia/Schizoaffective disorder:Yes  Duration of Psychotic Symptoms:No data recorded Hallucinations:Hallucinations: None  Ideas of Reference:None  Suicidal Thoughts:Suicidal Thoughts: No  Homicidal Thoughts:Homicidal Thoughts: No   Sensorium  Memory:Immediate Fair; Recent Fair; Remote Fair  Judgment:Fair  Insight:Lacking   Executive Functions  Concentration:Fair  Attention Span:Fair  Recall:Poor  Fund of  Knowledge:Poor  Language:Fair   Psychomotor Activity  Psychomotor Activity:Psychomotor Activity: Decreased   Assets  Assets:Desire for Improvement; Resilience   Sleep  Sleep:Sleep: Good Number of Hours of Sleep: 6.25    Physical Exam: Physical Exam Vitals and nursing note reviewed.  HENT:     Head: Normocephalic and atraumatic.  Pulmonary:     Effort: Pulmonary effort is normal.  Neurological:     General: No focal deficit present.     Mental Status: She is alert.    Review of Systems  Constitutional: Positive for malaise/fatigue.  Musculoskeletal: Positive for back pain and myalgias.  Neurological: Positive for focal weakness and weakness.  Psychiatric/Behavioral: Positive for memory loss.   Blood pressure (!) 124/54, pulse (!) 54, temperature 98.1 F (36.7 C), temperature source Oral, resp. rate 18, height 5'  1" (1.549 m), weight 105.7 kg, SpO2 92 %. Body mass index is 44.02 kg/m.   Treatment Plan Summary: Daily contact with patient to assess and evaluate symptoms and progress in treatment, Medication management and Plan : Patient is seen and examined.  Patient is a 70 year old female with the above-stated past psychiatric history who is seen in follow-up.   Diagnosis: 1. Bipolar disorder, most recently depressed 2. Type 2 diabetes mellitus 3. Hypothyroidism 4. Mild cognitive impairment 5. Urinary tract infection  Pertinent findings on examination today: 1.  Patient denied any depressive or manic symptoms. 2.  Patient remains physically deconditioned, and is potentially worsening while on the psychiatric unit. 3.  Patient will complete antibiotics today for her urinary tract infection. 4.  Blood sugar control is good. 5.  Blood pressure control is good. 6.  She has some mild bleeding from the hemorrhoids, but no gross bleeding. 7.  Hemoglobin and hematocrit are in the normal range and do not appear to be dropping. 8.  I have written for the patient  to get out of bed and walk at least 4 times a day. 9.  Patient was restarted on Xarelto because of immobility and a history of emboli.  Plan: 1. Continue coated aspirin 81 mg p.o. daily for heart health. 2. Continue Cogentin 0.5 mg p.o. twice daily for side effects of medications. 3.  Continue Wellbutrin XL to 300 mg p.o. daily for depression and anxiety. 4. Continue Depakote ER 1000 mg p.o. nightly for mood stability. 5. Continue Colace 100 mg p.o. daily for constipation. 6. Continue fenofibrate 160 mg p.o. daily for hypertriglyceridemia. 7. Continue Gerhardt'shearts buttcream for possible hotspots of skin breakdown. 8. Patient remains in a special bed to prevent skin breakdown. 9.   Continue levothyroxine 150 mcg p.o. daily for hypothyroidism. 10. Continue metformin 500 mg p.o. every morning for diabetes mellitus type 2 11. Patient will complete Macrobid 100 mg p.o. every 12 hours on 09/28/2020.  12. Continue nystatin powder for skin fungal infection. 13. Continue MiraLAX 17 g 8 ounces of water p.o. daily for constipation. 14. Continue Risperdal 4 mg p.o. nightly for mood stability and psychosis. 15. Continue Crestor 40 mg p.o. daily for hyperlipidemia. 16. Continue trazodone 150 mg p.o. nightly as needed insomnia. 17. Continue Xarelto 15 mg p.o. twice daily with food for 41 doses, then increase to 20 mg p.o. twice daily with supper.  This is for emboli prevention of DVT and pulmonary emboli. 18.  Disposition planning-in progress, placement issues.   Antonieta Pert, MD 10/06/2020, 11:56 AM

## 2020-10-06 NOTE — [facility] Group Notes (Signed)
Patient did not attend group   ADULT GRIEF GROUP NOTE:   Spiritual care group on grief and loss facilitated by chaplain Katy Avraham Benish, [facility]   Group Goal:   Support / Education around grief and loss   Members engage in facilitated group support and psycho-social education.   Group Description:   Following introductions and group rules, group members engaged in facilitated group dialog and support around topic of loss, with particular support around experiences of loss in their lives. Group Identified types of loss (relationships / self / things) and identified patterns, circumstances, and changes that precipitate losses. Reflected on thoughts / feelings around loss, normalized grief responses, and recognized variety in grief experience. Group noted Worden's four tasks of grief in discussion.   Group drew on Adlerian / Rogerian, narrative, MI,    

## 2020-10-06 NOTE — Progress Notes (Signed)
1:1 Nursing note - Patient was assisted up to use the bathroom and voided. Her pad had light red drainage at the buttock area. Perineal rinse spray and blotted dry. Vitals were taken and am medication given. She brushed her teeth before requesting to lie back down. She was able to stand for short period of time while undergarments changed before asking to sit back down. 1:1 continues and patient is safe with sitter at bedside.

## 2020-10-06 NOTE — [facility] Group Notes (Signed)
Occupational Therapy Group Note Date: 10/06/2020 Group Topic/Focus: Coping Skills  Group Description: Group encouraged increased engagement and participation through discussion and activity focused on Mental Health Awareness Month. May is Mental Health Awareness month and patients engaged in discussion surrounding the stigma surrounding mental health and feelings that they wanted to share/express. Patients were encouraged to engage in activity in which they created mental health ribbons that represented different diagnoses, symptoms, and things in which they would like to bring awareness too. Completed ribbons were then displayed around the unit for mental health awareness month.  Participation Level: Minimal   Participation Quality: Maximum Cues   Behavior: Calm   Speech/Thought Process: Slurred   Affect/Mood: Constricted   Insight: Impaired   Judgement: Impaired   Individualization: Julie Morrow was active in their participation of group discussion/activity with max verbal cues for engagement. Pt was observed quietly coloring in her ribbon and responded appropriately when asked what color she would like to use. Pt created a pink ribbon and shared that pink is her favorite color .   Modes of Intervention: Activity, Discussion and Education  Patient Response to Interventions:  Attentive   Plan: Continue to engage patient in OT groups 2 - 3x/week.  10/06/2020  Donne Hazel, MOT, OTR/L   Donne Hazel, MOT, OTR/L

## 2020-10-06 NOTE — Progress Notes (Signed)
1:1 Pt was toileted x2 this am. 2 MHT and nurse toileted pt. Safety maintained. 1:1 continues.

## 2020-10-06 NOTE — Progress Notes (Signed)
Nursing 1:1 note D:Pt observed lying in bed awake. RR even and unlabored. No distress noted. A: 1:1 observation continues for safety  R: pt remains safe  

## 2020-10-06 NOTE — Tx Team (Signed)
Interdisciplinary Treatment and Diagnostic Plan Update  10/06/2020 Time of Session: 10:05am  Julie Morrow MRN: [id number]  Principal Diagnosis: Major neurocognitive disorder [facility])  Secondary Diagnoses: Principal Problem:   Major neurocognitive disorder (HCC) Active Problems:   HLD (hyperlipidemia)   Hypothyroidism   Bipolar depression (HCC)   Diabetes (HCC)   Current Medications:  Current Facility-Administered Medications  Medication Dose Route Frequency Provider Last Rate Last Admin  . acetaminophen (TYLENOL) tablet 650 mg  650 mg Oral Q6H PRN Clapacs, Jackquline Denmark, MD   650 mg at 10/05/20 1238  . alum & mag hydroxide-simeth (MAALOX/MYLANTA) 200-200-20 MG/5ML suspension 30 mL  30 mL Oral Q4H PRN Clapacs, John T, MD      . aspirin EC tablet 81 mg  81 mg Oral Daily Clapacs, Jackquline Denmark, MD   81 mg at 10/06/20 0824  . benztropine (COGENTIN) tablet 0.5 mg  0.5 mg Oral BID Bartholomew Crews E, MD   0.5 mg at 10/06/20 0825  . bisacodyl (DULCOLAX) EC tablet 5 mg  5 mg Oral QHS Antonieta Pert, MD   5 mg at 10/05/20 2028  . buPROPion (WELLBUTRIN XL) 24 hr tablet 300 mg  300 mg Oral Daily Antonieta Pert, MD   300 mg at 10/06/20 5784  . divalproex (DEPAKOTE ER) 24 hr tablet 1,000 mg  1,000 mg Oral QHS Antonieta Pert, MD   1,000 mg at 10/05/20 2028  . fenofibrate tablet 160 mg  160 mg Oral Daily Clapacs, Jackquline Denmark, MD   160 mg at 10/06/20 0825  . Gerhardt's butt cream   Topical BID PRN White, Patrice L, NP   1 application at 10/06/20 0534  . hydrocortisone (ANUSOL-HC) 2.5 % rectal cream   Rectal TID Antonieta Pert, MD   1 application at 10/05/20 2140  . levothyroxine (SYNTHROID) tablet 175 mcg  175 mcg Oral Q0600 Comer Locket, MD   175 mcg at 10/06/20 0533  . loratadine (CLARITIN) tablet 10 mg  10 mg Oral Daily Clapacs, Jackquline Denmark, MD   10 mg at 10/06/20 0824  . magnesium hydroxide (MILK OF MAGNESIA) suspension 30 mL  30 mL Oral Daily PRN Clapacs, John T, MD      . metFORMIN (GLUCOPHAGE) tablet  500 mg  500 mg Oral Q breakfast Clapacs, Jackquline Denmark, MD   500 mg at 10/06/20 0829  . nicotine (NICODERM CQ - dosed in mg/24 hours) patch 14 mg  14 mg Transdermal Daily Antonieta Pert, MD   14 mg at 10/06/20 0827  . nystatin (MYCOSTATIN/NYSTOP) topical powder   Topical TID Laveda Abbe, NP   Given at 10/06/20 0827  . polyethylene glycol (MIRALAX / GLYCOLAX) packet 17 g  17 g Oral Daily Bartholomew Crews E, MD   17 g at 10/06/20 0827  . risperiDONE (RISPERDAL) tablet 4 mg  4 mg Oral QHS Comer Locket, MD   4 mg at 10/05/20 2028  . Rivaroxaban (XARELTO) tablet 15 mg  15 mg Oral BID WC Antonieta Pert, MD   15 mg at 10/06/20 6962   Followed by  . [START ON 10/26/2020] rivaroxaban (XARELTO) tablet 20 mg  20 mg Oral Q supper Antonieta Pert, MD      . rosuvastatin (CRESTOR) tablet 40 mg  40 mg Oral Daily Comer Locket, MD   40 mg at 10/06/20 9528  . traZODone (DESYREL) tablet 150 mg  150 mg Oral QHS PRN Antonieta Pert, MD   150 mg at  10/03/20 2223   PTA Medications: Medications Prior to Admission  Medication Sig Dispense Refill Last Dose  . aspirin 81 MG chewable tablet Chew 1 tablet (81 mg total) by mouth daily. (Patient not taking: No sig reported) 30 tablet 1   . CINNAMON PO Take 1 tablet by mouth daily.     . divalproex (DEPAKOTE ER) 250 MG 24 hr tablet Take 750 mg by mouth at bedtime.     . fenofibrate 160 MG tablet Take 1 tablet (160 mg total) by mouth daily with breakfast. (Patient not taking: No sig reported) 30 tablet 1   . FEROSUL 325 (65 Fe) MG tablet Take 325 mg by mouth daily.     . hydrOXYzine (ATARAX/VISTARIL) 10 MG tablet Take 10 mg by mouth 3 (three) times daily as needed. Take 10 mg by mouth three times daily as needed for anxiety and 30 mg at bedtime as needed for sleep     . levothyroxine (SYNTHROID) 175 MCG tablet Take 175 mcg by mouth daily.     Marland Kitchen levothyroxine (SYNTHROID) 200 MCG tablet Take 1 tablet (200 mcg total) by mouth daily before breakfast. (Patient  not taking: No sig reported) 30 tablet 1   . loratadine (CLARITIN) 10 MG tablet Take 1 tablet (10 mg total) by mouth daily. (Patient not taking: No sig reported) 30 tablet 1   . losartan (COZAAR) 25 MG tablet Take 25 mg by mouth daily.     . melatonin 5 MG TABS Take 5-10 mg by mouth at bedtime.     . metFORMIN (GLUCOPHAGE) 500 MG tablet Take 1 tablet (500 mg total) by mouth daily with breakfast. (Patient not taking: No sig reported) 30 tablet 1   . Multiple Vitamins-Minerals (CENTRUM) tablet Take 1 tablet by mouth daily.     . polyvinyl alcohol (LIQUIFILM TEARS) 1.4 % ophthalmic solution Place 1 drop into both eyes as needed for dry eyes.     . Probiotic Product (PROBIOTIC PO) Take 1 capsule by mouth daily.     . risperiDONE (RISPERDAL) 3 MG tablet Take 2 tablets (6 mg total) by mouth at bedtime. (Patient not taking: No sig reported) 60 tablet 1   . rosuvastatin (CRESTOR) 40 MG tablet Take 40 mg by mouth daily.     . RYBELSUS 14 MG TABS Take 1 tablet by mouth every morning.     . traZODone (DESYREL) 50 MG tablet Take 50 mg by mouth at bedtime as needed for sleep.       Patient Stressors: Health problems Occupational concerns Other: living by myself and feeling home bound  Patient Strengths: Ability for insight Capable of independent living Licensed conveyancer Supportive family/friends  Treatment Modalities: Medication Management, Group therapy, Case management,  1 to 1 session with clinician, Psychoeducation, Recreational therapy.   Physician Treatment Plan for Primary Diagnosis: Major neurocognitive disorder [facility]) Long Term Goal(s): Improvement in symptoms so as ready for discharge   Short Term Goals: Ability to identify changes in lifestyle to reduce recurrence of condition will improve Ability to identify and develop effective coping behaviors will improve  Medication Management: Evaluate patient's response, side effects, and tolerance of medication  regimen.  Therapeutic Interventions: 1 to 1 sessions, Unit Group sessions and Medication administration.  Evaluation of Outcomes: Progressing  Physician Treatment Plan for Secondary Diagnosis: Principal Problem:   Major neurocognitive disorder (HCC) Active Problems:   HLD (hyperlipidemia)   Hypothyroidism   Bipolar depression (HCC)   Diabetes (HCC)  Long Term Goal(s): Improvement  in symptoms so as ready for discharge   Short Term Goals: Ability to identify changes in lifestyle to reduce recurrence of condition will improve Ability to identify and develop effective coping behaviors will improve     Medication Management: Evaluate patient's response, side effects, and tolerance of medication regimen.  Therapeutic Interventions: 1 to 1 sessions, Unit Group sessions and Medication administration.  Evaluation of Outcomes: Progressing   RN Treatment Plan for Primary Diagnosis: Major neurocognitive disorder (HCC) Long Term Goal(s): Knowledge of disease and therapeutic regimen to maintain health will improve  Short Term Goals: Ability to remain free from injury will improve, Ability to participate in decision making will improve, Ability to verbalize feelings will improve, Ability to disclose and discuss suicidal ideas and Ability to identify and develop effective coping behaviors will improve  Medication Management: RN will administer medications as ordered by provider, will assess and evaluate patient's response and provide education to patient for prescribed medication. RN will report any adverse and/or side effects to prescribing provider.  Therapeutic Interventions: 1 on 1 counseling sessions, Psychoeducation, Medication administration, Evaluate responses to treatment, Monitor vital signs and CBGs as ordered, Perform/monitor CIWA, COWS, AIMS and Fall Risk screenings as ordered, Perform wound care treatments as ordered.  Evaluation of Outcomes: Progressing   LCSW Treatment Plan for  Primary Diagnosis: Major neurocognitive disorder [facility]) Long Term Goal(s): Safe transition to appropriate next level of care at discharge, Engage patient in therapeutic group addressing interpersonal concerns.  Short Term Goals: Engage patient in aftercare planning with referrals and resources, Increase social support, Increase emotional regulation, Facilitate acceptance of mental health diagnosis and concerns, Identify triggers associated with mental health/substance abuse issues and Increase skills for wellness and recovery  Therapeutic Interventions: Assess for all discharge needs, 1 to 1 time with Social worker, Explore available resources and support systems, Assess for adequacy in community support network, Educate family and significant other(s) on suicide prevention, Complete Psychosocial Assessment, Interpersonal group therapy.  Evaluation of Outcomes: Progressing   Progress in Treatment: Attending groups: Yes. Participating in groups: Yes. Taking medication as prescribed: Yes. Toleration medication: Yes. Family/Significant other contact made: Yes, individual(s) contacted:  Sister Patient understands diagnosis: No. Discussing patient identified problems/goals with staff: Yes. Medical problems stabilized or resolved: Yes. Denies suicidal/homicidal ideation: Yes. Issues/concerns per patient self-inventory: No.   New problem(s) identified: No, Describe:  None  New Short Term/Long Term Goal(s): medication stabilization, elimination of SI thoughts, development of comprehensive mental wellness plan.   Patient Goals: "To move forward and learn some new skills"  Discharge Plan or Barriers: Patient recently admitted. CSW will continue to follow and assess for appropriate referrals and possible discharge planning.   Reason for Continuation of Hospitalization: Depression Medical Issues Medication stabilization Suicidal ideation  Estimated Length of Stay: 3 to 5 days    Attendees: Patient: Julie Morrow  10/01/2020   Physician: Landry Mellow, MD  10/01/2020   Nursing:  10/01/2020   RN Care Manager: 10/01/2020   Social Worker: Melba Coon, LCSWA  10/01/2020   Recreational Therapist:  10/01/2020   Other:  10/01/2020   Other:  10/01/2020   Other: 10/01/2020     Scribe for Treatment Team: Felizardo Hoffmann, LCSWA 10/06/2020 11:54 AM

## 2020-10-06 NOTE — Progress Notes (Addendum)
1:1  Pt. Was toileted with nurse and MHT. Ointments applied, brief changed. Pt. Got up from the toilet and had to sit back down, when she sat back down, pt. Urinated. Safety maintained. 1:1 continues.

## 2020-10-06 NOTE — Progress Notes (Signed)
   10/06/20 0700  Vital Signs  Temp 98.1 F (36.7 C)  Temp Source Oral  Pulse Rate 68  Pulse Rate Source Dinamap  BP (!) 107/52  BP Method Automatic   D: Patient denies SI/HI/AVH. Patient denied anxiety but rated depression 3/10. Pt. Was out in open areas with her 1:1 MHT. Pt. Was toileted at 0715 then was toileted less than an hour later. Pt stated "is that your snowball cat today?" Pt. Was a 2 person assist to toilet and a 4 person assist to move up in bed. Pt. Impulsive, tries to stand up when on the toilet and pt. Will just drop her weight while standing. Gait belt, walker and wheel chair needed. Pt. Refuses to walk except to take 2 steps to get to bed. Pt. Is unsteady on her feet. Pt. Is a very high fall risk. Pt. Stated that she was "pissing in her diaper" while ointment was applied to her rectum and buttocks. Pt. Was dry.  A:  Patient took scheduled medicine.  Support and encouragement provided. Could not do daily weights because we do not have a bed scale.  Routine safety checks conducted every 15 minutes. Patient  Informed to notify staff with any concerns.   R: MHT present. Safety maintained.1:1 continues.

## 2020-10-06 NOTE — Progress Notes (Signed)
1:1 Nursing note ( late entry for 0300) Patient is lying in bed asleep with eyes closed and respirations even and unlabored. 1:1 continues and patient is safe.

## 2020-10-06 NOTE — Plan of Care (Signed)
  Problem: Coping: Goal: Ability to demonstrate self-control will improve Outcome: Progressing   Problem: Safety: Goal: Periods of time without injury will increase Outcome: Progressing   Problem: Coping: Goal: Coping ability will improve Outcome: Progressing Goal: Will verbalize feelings Outcome: Progressing

## 2020-10-07 ENCOUNTER — Observation Stay (HOSPITAL_COMMUNITY): Payer: Medicare Other

## 2020-10-07 ENCOUNTER — Encounter (HOSPITAL_COMMUNITY): Payer: Self-pay | Admitting: Student

## 2020-10-07 ENCOUNTER — Other Ambulatory Visit: Payer: Self-pay

## 2020-10-07 ENCOUNTER — Inpatient Hospital Stay (HOSPITAL_COMMUNITY)
Admission: EM | Admit: 2020-10-07 | Discharge: 2020-11-07 | DRG: 689 | Disposition: A | Discharge status: Skilled Nursing Facility | Payer: Medicare Other | Attending: Internal Medicine | Admitting: Internal Medicine

## 2020-10-07 DIAGNOSIS — Z20822 Contact with and (suspected) exposure to covid-19: Secondary | ICD-10-CM | POA: Diagnosis not present

## 2020-10-07 DIAGNOSIS — Z7984 Long term (current) use of oral hypoglycemic drugs: Secondary | ICD-10-CM | POA: Diagnosis not present

## 2020-10-07 DIAGNOSIS — G928 Other toxic encephalopathy: Secondary | ICD-10-CM | POA: Diagnosis not present

## 2020-10-07 DIAGNOSIS — R9431 Abnormal electrocardiogram [ECG] [EKG]: Secondary | ICD-10-CM | POA: Diagnosis not present

## 2020-10-07 DIAGNOSIS — Z9151 Personal history of suicidal behavior: Secondary | ICD-10-CM

## 2020-10-07 DIAGNOSIS — E039 Hypothyroidism, unspecified: Secondary | ICD-10-CM | POA: Diagnosis not present

## 2020-10-07 DIAGNOSIS — F0390 Unspecified dementia without behavioral disturbance: Secondary | ICD-10-CM | POA: Diagnosis not present

## 2020-10-07 DIAGNOSIS — N179 Acute kidney failure, unspecified: Secondary | ICD-10-CM | POA: Diagnosis not present

## 2020-10-07 DIAGNOSIS — R1312 Dysphagia, oropharyngeal phase: Secondary | ICD-10-CM | POA: Diagnosis not present

## 2020-10-07 DIAGNOSIS — Z1159 Encounter for screening for other viral diseases: Secondary | ICD-10-CM | POA: Diagnosis not present

## 2020-10-07 DIAGNOSIS — E785 Hyperlipidemia, unspecified: Secondary | ICD-10-CM | POA: Diagnosis not present

## 2020-10-07 DIAGNOSIS — G9340 Encephalopathy, unspecified: Secondary | ICD-10-CM

## 2020-10-07 DIAGNOSIS — Z7982 Long term (current) use of aspirin: Secondary | ICD-10-CM

## 2020-10-07 DIAGNOSIS — E079 Disorder of thyroid, unspecified: Secondary | ICD-10-CM | POA: Insufficient documentation

## 2020-10-07 DIAGNOSIS — F419 Anxiety disorder, unspecified: Secondary | ICD-10-CM | POA: Diagnosis present

## 2020-10-07 DIAGNOSIS — R1310 Dysphagia, unspecified: Secondary | ICD-10-CM | POA: Diagnosis not present

## 2020-10-07 DIAGNOSIS — N898 Other specified noninflammatory disorders of vagina: Secondary | ICD-10-CM | POA: Diagnosis not present

## 2020-10-07 DIAGNOSIS — Z8261 Family history of arthritis: Secondary | ICD-10-CM | POA: Diagnosis not present

## 2020-10-07 DIAGNOSIS — K219 Gastro-esophageal reflux disease without esophagitis: Secondary | ICD-10-CM | POA: Diagnosis present

## 2020-10-07 DIAGNOSIS — B965 Pseudomonas (aeruginosa) (mallei) (pseudomallei) as the cause of diseases classified elsewhere: Secondary | ICD-10-CM | POA: Diagnosis present

## 2020-10-07 DIAGNOSIS — I6389 Other cerebral infarction: Secondary | ICD-10-CM | POA: Diagnosis not present

## 2020-10-07 DIAGNOSIS — E119 Type 2 diabetes mellitus without complications: Secondary | ICD-10-CM | POA: Diagnosis not present

## 2020-10-07 DIAGNOSIS — R4781 Slurred speech: Secondary | ICD-10-CM | POA: Diagnosis present

## 2020-10-07 DIAGNOSIS — Z820 Family history of epilepsy and other diseases of the nervous system: Secondary | ICD-10-CM

## 2020-10-07 DIAGNOSIS — F32A Depression, unspecified: Secondary | ICD-10-CM | POA: Insufficient documentation

## 2020-10-07 DIAGNOSIS — R627 Adult failure to thrive: Secondary | ICD-10-CM | POA: Diagnosis not present

## 2020-10-07 DIAGNOSIS — Z7401 Bed confinement status: Secondary | ICD-10-CM | POA: Diagnosis not present

## 2020-10-07 DIAGNOSIS — J3489 Other specified disorders of nose and nasal sinuses: Secondary | ICD-10-CM | POA: Diagnosis not present

## 2020-10-07 DIAGNOSIS — F3163 Bipolar disorder, current episode mixed, severe, without psychotic features: Secondary | ICD-10-CM | POA: Diagnosis not present

## 2020-10-07 DIAGNOSIS — Z6841 Body Mass Index (BMI) 40.0 and over, adult: Secondary | ICD-10-CM

## 2020-10-07 DIAGNOSIS — Z79890 Hormone replacement therapy (postmenopausal): Secondary | ICD-10-CM | POA: Diagnosis not present

## 2020-10-07 DIAGNOSIS — Z743 Need for continuous supervision: Secondary | ICD-10-CM | POA: Diagnosis not present

## 2020-10-07 DIAGNOSIS — N390 Urinary tract infection, site not specified: Principal | ICD-10-CM | POA: Diagnosis present

## 2020-10-07 DIAGNOSIS — Z79899 Other long term (current) drug therapy: Secondary | ICD-10-CM | POA: Diagnosis not present

## 2020-10-07 DIAGNOSIS — F17210 Nicotine dependence, cigarettes, uncomplicated: Secondary | ICD-10-CM | POA: Diagnosis present

## 2020-10-07 DIAGNOSIS — R4182 Altered mental status, unspecified: Secondary | ICD-10-CM | POA: Diagnosis present

## 2020-10-07 DIAGNOSIS — R0989 Other specified symptoms and signs involving the circulatory and respiratory systems: Secondary | ICD-10-CM

## 2020-10-07 DIAGNOSIS — I959 Hypotension, unspecified: Secondary | ICD-10-CM | POA: Diagnosis not present

## 2020-10-07 DIAGNOSIS — R404 Transient alteration of awareness: Secondary | ICD-10-CM | POA: Diagnosis not present

## 2020-10-07 DIAGNOSIS — G9389 Other specified disorders of brain: Secondary | ICD-10-CM | POA: Diagnosis not present

## 2020-10-07 DIAGNOSIS — R29818 Other symptoms and signs involving the nervous system: Secondary | ICD-10-CM | POA: Diagnosis not present

## 2020-10-07 DIAGNOSIS — I700 Atherosclerosis of aorta: Secondary | ICD-10-CM | POA: Diagnosis not present

## 2020-10-07 DIAGNOSIS — I739 Peripheral vascular disease, unspecified: Secondary | ICD-10-CM | POA: Diagnosis not present

## 2020-10-07 DIAGNOSIS — G319 Degenerative disease of nervous system, unspecified: Secondary | ICD-10-CM | POA: Diagnosis not present

## 2020-10-07 DIAGNOSIS — M6281 Muscle weakness (generalized): Secondary | ICD-10-CM | POA: Diagnosis not present

## 2020-10-07 DIAGNOSIS — J449 Chronic obstructive pulmonary disease, unspecified: Secondary | ICD-10-CM | POA: Diagnosis not present

## 2020-10-07 LAB — URINALYSIS, ROUTINE W REFLEX MICROSCOPIC
Bacteria, UA: NONE SEEN
Bilirubin Urine: NEGATIVE
Glucose, UA: NEGATIVE mg/dL
Ketones, ur: NEGATIVE mg/dL
Nitrite: POSITIVE — AB
Protein, ur: NEGATIVE mg/dL
Specific Gravity, Urine: 1.008 (ref 1.005–1.030)
pH: 9 — ABNORMAL HIGH (ref 5.0–8.0)

## 2020-10-07 LAB — CBC WITH DIFFERENTIAL/PLATELET
Abs Immature Granulocytes: 0.02 10*3/uL (ref 0.00–0.07)
Basophils Absolute: 0 10*3/uL (ref 0.0–0.1)
Basophils Relative: 0 %
Eosinophils Absolute: 0.1 10*3/uL (ref 0.0–0.5)
Eosinophils Relative: 2 %
HCT: 44.8 % (ref 36.0–46.0)
Hemoglobin: 14.4 g/dL (ref 12.0–15.0)
Immature Granulocytes: 0 %
Lymphocytes Relative: 28 %
Lymphs Abs: 2 10*3/uL (ref 0.7–4.0)
MCH: 31 pg (ref 26.0–34.0)
MCHC: 32.1 g/dL (ref 30.0–36.0)
MCV: 96.3 fL (ref 80.0–100.0)
Monocytes Absolute: 0.7 10*3/uL (ref 0.1–1.0)
Monocytes Relative: 10 %
Neutro Abs: 4.5 10*3/uL (ref 1.7–7.7)
Neutrophils Relative %: 60 %
Platelets: 205 10*3/uL (ref 150–400)
RBC: 4.65 MIL/uL (ref 3.87–5.11)
RDW: 13.6 % (ref 11.5–15.5)
WBC: 7.4 10*3/uL (ref 4.0–10.5)
nRBC: 0 % (ref 0.0–0.2)

## 2020-10-07 LAB — COMPREHENSIVE METABOLIC PANEL
ALT: 14 U/L (ref 0–44)
AST: 19 U/L (ref 15–41)
Albumin: 3.9 g/dL (ref 3.5–5.0)
Alkaline Phosphatase: 52 U/L (ref 38–126)
Anion gap: 10 (ref 5–15)
BUN: 15 mg/dL (ref 8–23)
CO2: 30 mmol/L (ref 22–32)
Calcium: 9.7 mg/dL (ref 8.9–10.3)
Chloride: 98 mmol/L (ref 98–111)
Creatinine, Ser: 1.01 mg/dL — ABNORMAL HIGH (ref 0.44–1.00)
GFR, Estimated: >60 mL/min (ref >60)
Glucose, Bld: 121 mg/dL — ABNORMAL HIGH (ref 70–99)
Potassium: 4.7 mmol/L (ref 3.5–5.1)
Sodium: 138 mmol/L (ref 135–145)
Total Bilirubin: 0.4 mg/dL (ref 0.3–1.2)
Total Protein: 7.3 g/dL (ref 6.5–8.1)

## 2020-10-07 LAB — RESP PANEL BY RT-PCR (FLU A&B, COVID) ARPGX2
Influenza A by PCR: NEGATIVE
Influenza B by PCR: NEGATIVE
SARS Coronavirus 2 by RT PCR: NEGATIVE

## 2020-10-07 LAB — VALPROIC ACID LEVEL: Valproic Acid Lvl: 38 ug/mL — ABNORMAL LOW (ref 50.0–100.0)

## 2020-10-07 LAB — AMMONIA: Ammonia: 43 umol/L — ABNORMAL HIGH (ref 9–35)

## 2020-10-07 LAB — GLUCOSE, CAPILLARY: Glucose-Capillary: 110 mg/dL — ABNORMAL HIGH (ref 70–99)

## 2020-10-07 LAB — LITHIUM LEVEL: Lithium Lvl: 0.1 mmol/L — ABNORMAL LOW (ref 0.60–1.20)

## 2020-10-07 LAB — LIPASE, BLOOD: Lipase: 22 U/L (ref 11–51)

## 2020-10-07 LAB — HIV ANTIBODY (ROUTINE TESTING W REFLEX): HIV Screen 4th Generation wRfx: NONREACTIVE

## 2020-10-07 MED ORDER — ACETAMINOPHEN 650 MG RE SUPP: 650.0000 mg | Freq: Four times a day (QID) | RECTAL | Status: DC | PRN

## 2020-10-07 MED ORDER — BUPROPION HCL ER (XL) 150 MG PO TB24
300.0000 mg | ORAL_TABLET | Freq: Every day | ORAL | Status: DC
  Administered 2020-10-08 – 2020-11-07 (×30): 300 mg via ORAL
  Filled 2020-10-07 (×31): qty 2

## 2020-10-07 MED ORDER — LACTULOSE 10 GM/15ML PO SOLN
10.0000 g | Freq: Once | ORAL | Status: AC
  Administered 2020-10-07: 10 g via ORAL
  Filled 2020-10-07: qty 30

## 2020-10-07 MED ORDER — BENZTROPINE MESYLATE 0.5 MG PO TABS
0.5000 mg | ORAL_TABLET | Freq: Two times a day (BID) | ORAL | Status: DC
  Administered 2020-10-07 – 2020-11-07 (×61): 0.5 mg via ORAL
  Filled 2020-10-07 (×62): qty 1

## 2020-10-07 MED ORDER — RISPERIDONE 2 MG PO TABS
2.0000 mg | ORAL_TABLET | Freq: Every day | ORAL | Status: DC
Start: 2020-10-07 — End: 2020-11-07
  Administered 2020-10-07 – 2020-11-06 (×31): 2 mg via ORAL
  Filled 2020-10-07 (×31): qty 1

## 2020-10-07 MED ORDER — LEVOTHYROXINE SODIUM 50 MCG PO TABS
150.0000 ug | ORAL_TABLET | Freq: Every day | ORAL | Status: DC
  Administered 2020-10-08 – 2020-11-07 (×31): 150 ug via ORAL
  Filled 2020-10-07 (×31): qty 1

## 2020-10-07 MED ORDER — ENOXAPARIN SODIUM 40 MG/0.4ML IJ SOSY
40.0000 mg | PREFILLED_SYRINGE | INTRAMUSCULAR | Status: DC
  Administered 2020-10-07 – 2020-11-06 (×25): 40 mg via SUBCUTANEOUS
  Filled 2020-10-07 (×26): qty 0.4

## 2020-10-07 MED ORDER — POLYETHYLENE GLYCOL 3350 17 G PO PACK
17.0000 g | PACK | Freq: Every day | ORAL | Status: DC | PRN
  Filled 2020-10-07: qty 1

## 2020-10-07 MED ORDER — SODIUM CHLORIDE 0.9 % IV BOLUS
1000.0000 mL | Freq: Once | INTRAVENOUS | Status: AC
  Administered 2020-10-07: 1000 mL via INTRAVENOUS

## 2020-10-07 MED ORDER — ACETAMINOPHEN 325 MG PO TABS: 650.0000 mg | ORAL_TABLET | Freq: Four times a day (QID) | ORAL | Status: DC | PRN

## 2020-10-07 MED ORDER — ROSUVASTATIN CALCIUM 20 MG PO TABS
40.0000 mg | ORAL_TABLET | Freq: Every day | ORAL | Status: DC
  Administered 2020-10-08 – 2020-11-07 (×30): 40 mg via ORAL
  Filled 2020-10-07 (×3): qty 2
  Filled 2020-10-07 (×3): qty 4
  Filled 2020-10-07: qty 2
  Filled 2020-10-07 (×2): qty 4
  Filled 2020-10-07 (×3): qty 2
  Filled 2020-10-07: qty 4
  Filled 2020-10-07: qty 2
  Filled 2020-10-07 (×2): qty 4
  Filled 2020-10-07 (×4): qty 2
  Filled 2020-10-07: qty 4
  Filled 2020-10-07: qty 2
  Filled 2020-10-07: qty 4
  Filled 2020-10-07: qty 2
  Filled 2020-10-07: qty 4
  Filled 2020-10-07: qty 2
  Filled 2020-10-07: qty 4
  Filled 2020-10-07: qty 2
  Filled 2020-10-07: qty 8
  Filled 2020-10-07: qty 2
  Filled 2020-10-07: qty 4
  Filled 2020-10-07 (×3): qty 2
  Filled 2020-10-07 (×2): qty 4
  Filled 2020-10-07: qty 2
  Filled 2020-10-07: qty 4
  Filled 2020-10-07: qty 2
  Filled 2020-10-07: qty 4
  Filled 2020-10-07: qty 2
  Filled 2020-10-07: qty 4
  Filled 2020-10-07 (×3): qty 2
  Filled 2020-10-07: qty 4
  Filled 2020-10-07 (×2): qty 2
  Filled 2020-10-07: qty 4
  Filled 2020-10-07: qty 2
  Filled 2020-10-07: qty 4
  Filled 2020-10-07: qty 2
  Filled 2020-10-07: qty 4
  Filled 2020-10-07: qty 2

## 2020-10-07 MED ORDER — RISPERIDONE 3 MG PO TABS
3.0000 mg | ORAL_TABLET | Freq: Every day | ORAL | Status: DC
  Filled 2020-10-07: qty 1

## 2020-10-07 MED ORDER — TRAZODONE HCL 100 MG PO TABS
150.0000 mg | ORAL_TABLET | Freq: Every evening | ORAL | Status: DC | PRN
  Administered 2020-10-07 – 2020-11-04 (×16): 150 mg via ORAL
  Filled 2020-10-07 (×18): qty 2

## 2020-10-07 MED ORDER — DIVALPROEX SODIUM ER 250 MG PO TB24
750.0000 mg | ORAL_TABLET | Freq: Every day | ORAL | Status: DC
  Filled 2020-10-07: qty 3

## 2020-10-07 MED ORDER — SODIUM CHLORIDE 0.9% FLUSH
3.0000 mL | Freq: Two times a day (BID) | INTRAVENOUS | Status: DC
  Administered 2020-10-08 – 2020-10-27 (×24): 3 mL via INTRAVENOUS

## 2020-10-07 MED ORDER — RISPERIDONE 2 MG PO TABS
2.0000 mg | ORAL_TABLET | Freq: Every day | ORAL | Status: DC
  Filled 2020-10-07: qty 1

## 2020-10-07 MED ORDER — LEVOTHYROXINE SODIUM 50 MCG PO TABS: 175.0000 ug | ORAL_TABLET | Freq: Every day | ORAL | Status: DC

## 2020-10-07 MED ORDER — ASPIRIN 81 MG PO CHEW
81.0000 mg | CHEWABLE_TABLET | Freq: Every day | ORAL | Status: DC
  Administered 2020-10-08 – 2020-11-07 (×30): 81 mg via ORAL
  Filled 2020-10-07 (×30): qty 1

## 2020-10-07 MED ORDER — DIVALPROEX SODIUM ER 500 MG PO TB24
750.0000 mg | ORAL_TABLET | Freq: Every day | ORAL | Status: DC
  Administered 2020-10-07 – 2020-11-06 (×31): 750 mg via ORAL
  Filled 2020-10-07 (×31): qty 1

## 2020-10-07 NOTE — Progress Notes (Signed)
Pt extremely lethargic and difficult to arouse this morning.  3 person assist to get pt out of bed into wheelchair.  Pt is confused and thinks she is at home.   Pt has skin break down in peri area and under left breast.  Areas were cleaned and dried and nystop powder applied under breast.  But cream applied to peri area.  Pt had difficulty swallowing morning medications.

## 2020-10-07 NOTE — Progress Notes (Signed)
Nursing 1:1 note D:Pt observed sleeping in bed with eyes closed. RR even and unlabored. No distress noted. A: 1:1 observation continues for safety  R: pt remains safe  

## 2020-10-07 NOTE — [facility] Counselor (Signed)
CSW attempted to contact Mrs. Julie Morrow at [facility] [phone] and [phone].  CSW left a HIPPA compliant voicemail asking for Mrs. Morrow to contact the CSW to discuss [name]'s current concerns and being moved to the Emergency Department.  [facility] has an upcoming visit with [name] this week.  CSW will continue to attempt to contact Mrs. Julie Morrow to let her know where [name] is located for their meeting this week.

## 2020-10-07 NOTE — ED Provider Notes (Signed)
[facility]-EMERGENCY DEPT Provider Note   CSN: [id number] Arrival date & time: 10/07/20  1251     History Chief Complaint  Patient presents with  . Failure To Thrive    Julie Morrow is a 70 y.o. female.  HPI     70 year old female with history of diabetes, bipolar disorder comes in with chief complaint of change in mental status.  Patient was admitted to behavioral health Hospital and was assessed by hospitalist team, who advised that patient come to the hospital and be admitted for observation.  Patient is oriented x3.  She reports no new weakness, chest pain, shortness of breath, fevers, chills.  She denies any new confusion, headache or trauma.  She is unsure if she is eating normally or not.  Past Medical History:  Diagnosis Date  . Anxiety   . Bipolar 1 disorder (HCC)   . Bradycardia   . Depression   . Diabetes mellitus, type II (HCC)    Patient takes Glucotrol and Januvia  . Hypothyroidism   . Lithium toxicity     Patient Active Problem List   Diagnosis Date Noted  . Depression 10/07/2020  . Thyroid disease 10/07/2020  . Hyperlipidemia 10/07/2020  . Failure to thrive in adult 10/07/2020  . Major neurocognitive disorder (HCC) 09/27/2020  . Diabetes (HCC) 09/23/2020  . Adjustment disorder with anxiety 09/13/2020  . Family discord 12/05/2019  . AMS (altered mental status) 05/14/2019  . Acute lower UTI 05/14/2019  . Adjustment disorder with mixed disturbance of emotions and conduct   . Bipolar I disorder with mania (HCC) 12/27/2018  . PAD (peripheral artery disease) (HCC) 10/16/2018  . GAD (generalized anxiety disorder) 03/04/2018  . Insomnia 03/04/2018  . Situational depression 03/04/2018  . Bipolar depression (HCC) 03/04/2018  . Acute kidney injury (HCC) 05/30/2017  . Bradycardia   . Lithium toxicity   . COPD GOLD 0 / still smoking  12/22/2016  . Generalized weakness 10/27/2015  . GERD (gastroesophageal reflux disease) 10/15/2015   . Bipolar 1 disorder, mixed, severe (HCC) 10/11/2015  . Diabetes type 2, controlled (HCC) 07/07/2015  . Morbid (severe) obesity due to excess calories (HCC) 07/07/2015  . Cigarette smoker 07/07/2015  . Hypothyroidism 07/07/2015  . HLD (hyperlipidemia) 11/07/2014  . Psoriasis 11/07/2014    Past Surgical History:  Procedure Laterality Date  . ABDOMINAL HYSTERECTOMY    . CHOLECYSTECTOMY       OB History   No obstetric history on file.     Family History  Problem Relation Age of Onset  . Dementia Mother   . Arthritis Father     Social History   Tobacco Use  . Smoking status: Current Every Day Smoker    Packs/day: 0.50    Years: 20.00    Pack years: 10.00    Types: Cigarettes    Start date: 11/06/1984  . Smokeless tobacco: Never Used  Vaping Use  . Vaping Use: Never used  Substance Use Topics  . Alcohol use: No    Alcohol/week: 0.0 standard drinks  . Drug use: No    Home Medications Prior to Admission medications   Medication Sig Start Date End Date Taking? Authorizing Provider  divalproex (DEPAKOTE ER) 250 MG 24 hr tablet Take 750 mg by mouth at bedtime. 10/01/19  Yes [provider]  aspirin 81 MG chewable tablet Chew 1 tablet (81 mg total) by mouth daily. Patient not taking: No sig reported 01/05/19   Clapacs, Jackquline Denmark, MD  CINNAMON PO Take  1 tablet by mouth daily.    [provider]  fenofibrate 160 MG tablet Take 1 tablet (160 mg total) by mouth daily with breakfast. Patient not taking: No sig reported 01/05/19   Clapacs, John T, MD  FEROSUL 325 (65 Fe) MG tablet Take 325 mg by mouth daily. 10/01/19   [provider]  hydrOXYzine (ATARAX/VISTARIL) 10 MG tablet Take 10 mg by mouth 3 (three) times daily as needed. Take 10 mg by mouth three times daily as needed for anxiety and 30 mg at bedtime as needed for sleep 09/13/20   [provider]  levothyroxine (SYNTHROID) 175 MCG tablet Take 175 mcg by mouth daily. 11/02/19   [provider]  levothyroxine (SYNTHROID) 200 MCG tablet Take 1 tablet (200 mcg total) by mouth daily before breakfast. Patient not taking: No sig reported 01/05/19   Clapacs, Jackquline Denmark, MD  loratadine (CLARITIN) 10 MG tablet Take 1 tablet (10 mg total) by mouth daily. Patient not taking: No sig reported 01/05/19   Clapacs, Jackquline Denmark, MD  losartan (COZAAR) 25 MG tablet Take 25 mg by mouth daily. 08/26/20   [provider]  melatonin 5 MG TABS Take 5-10 mg by mouth at bedtime.    [provider]  metFORMIN (GLUCOPHAGE) 500 MG tablet Take 1 tablet (500 mg total) by mouth daily with breakfast. Patient not taking: No sig reported 01/05/19   Clapacs, Jackquline Denmark, MD  Multiple Vitamins-Minerals (CENTRUM) tablet Take 1 tablet by mouth daily.    [provider]  polyvinyl alcohol (LIQUIFILM TEARS) 1.4 % ophthalmic solution Place 1 drop into both eyes as needed for dry eyes.    [provider]  Probiotic Product (PROBIOTIC PO) Take 1 capsule by mouth daily.    [provider]  risperiDONE (RISPERDAL) 3 MG tablet Take 2 tablets (6 mg total) by mouth at bedtime. Patient not taking: No sig reported 01/05/19   Clapacs, Jackquline Denmark, MD  rosuvastatin (CRESTOR) 40 MG tablet Take 40 mg by mouth daily. 12/31/18   [provider]  RYBELSUS 14 MG TABS Take 1 tablet by mouth every morning. 08/26/20   [provider]  traZODone (DESYREL) 50 MG tablet Take 50 mg by mouth at bedtime as needed for sleep. 10/24/19   [provider]    Allergies    Patient has no known allergies.  Review of Systems   Review of Systems  Constitutional: Positive for activity change.  Respiratory: Negative for shortness of breath.   Cardiovascular: Negative for chest pain.  Gastrointestinal: Negative for nausea and vomiting.  Hematological: Does not bruise/bleed easily.    Physical Exam Updated Vital Signs BP 126/68   Pulse 60   Temp 98 F (36.7 C) (Oral)   Resp (!) 22   Ht 5'  1" (1.549 m)   Wt 106 kg   SpO2 94%   BMI 44.15 kg/m   Physical Exam Vitals and nursing note reviewed.  Constitutional:      Appearance: She is well-developed.  HENT:     Head: Atraumatic.  Eyes:     Extraocular Movements: Extraocular movements intact.     Pupils: Pupils are equal, round, and reactive to light.     Comments: No nystagmus  Cardiovascular:     Rate and Rhythm: Normal rate.  Pulmonary:     Effort: Pulmonary effort is normal.  Abdominal:     General: Bowel sounds are normal.  Musculoskeletal:     Cervical back: Normal range of  motion and neck supple.  Skin:    General: Skin is warm.  Neurological:     Mental Status: She is alert and oriented to person, place, and time.     Comments: Moving all 4 extremities, gross sensory exam for upper and lower extremities normal.  Speech is slightly slurred There is no aphasia     ED Results / Procedures / Treatments   Labs (all labs ordered are listed, but only abnormal results are displayed) Labs Reviewed  COMPREHENSIVE METABOLIC PANEL - Abnormal; Notable for the following components:      Result Value   Glucose, Bld 121 (*)    Creatinine, Ser 1.01 (*)    All other components within normal limits  VALPROIC ACID LEVEL - Abnormal; Notable for the following components:   Valproic Acid Lvl 38 (*)    All other components within normal limits  AMMONIA - Abnormal; Notable for the following components:   Ammonia 43 (*)    All other components within normal limits  RESP PANEL BY RT-PCR (FLU A&B, COVID) ARPGX2  CBC WITH DIFFERENTIAL/PLATELET  URINALYSIS, ROUTINE W REFLEX MICROSCOPIC  LITHIUM LEVEL    EKG EKG Interpretation  Date/Time:  Tuesday Oct 07 2020 13:33:51 EDT Ventricular Rate:  61 PR Interval:  209 QRS Duration: 90 QT Interval:  463 QTC Calculation: 467 R Axis:   -20 Text Interpretation: Sinus rhythm Borderline left axis deviation Low voltage, precordial leads Borderline abnrm T, anterolateral leads  No acute changes Confirmed by Derwood Kaplan [phone]) on 10/07/2020 2:31:17 PM   Radiology No results found.  Procedures Procedures   Medications Ordered in ED Medications  sodium chloride 0.9 % bolus 1,000 mL (0 mLs Intravenous Stopped 10/07/20 1452)  lactulose (CHRONULAC) 10 GM/15ML solution 10 g (10 g Oral Given 10/07/20 1451)    ED Course  I have reviewed the triage vital signs and the nursing notes.  Pertinent labs & imaging results that were available during my care of the patient were reviewed by me and considered in my medical decision making (see chart for details).    MDM Rules/Calculators/A&P                          70 year old comes in a chief complaint of altered mental status.  There is no focal neurodeficits besides some slurred speech.  I do not know if the slurred speech is new, when she was last normal.  It appears to me more so that patient is encephalopathic, sluggish to respond and that is what is causing her speech to sound slurred and not a true organic problem like stroke.  Medicine team had seen the patient and wanted her to be admitted to the hospital, therefore I have consulted them.  Concerns are high for toxic encephalopathy, especially in the setting of possible new medication that could have been started.  We will check for basic labs and UA. Medicine to order CT head.  Final Clinical Impression(s) / ED Diagnoses Final diagnoses:  Acute encephalopathy    Rx / DC Orders ED Discharge Orders    None       Derwood Kaplan, MD 10/07/20 1456

## 2020-10-07 NOTE — B[facility] Suicide Risk Assessment (Signed)
[facility] Discharge Suicide Risk Assessment   Principal Problem: Major neurocognitive disorder [facility]) Discharge Diagnoses: Principal Problem:   Major neurocognitive disorder (HCC) Active Problems:   HLD (hyperlipidemia)   Hypothyroidism   Bipolar depression (HCC)   Diabetes (HCC)  Total Time Spent in Direct Patient Care:  I personally spent 30 minutes on the unit in direct patient care. The direct patient care time included face-to-face time with the patient, reviewing the patient's chart, communicating with other professionals, and coordinating care. Greater than 50% of this time was spent in counseling or coordinating care with the patient regarding goals of hospitalization, psycho-education, and discharge planning needs.  Musculoskeletal: Strength & Muscle Tone: untested patient uncooperative and in bed Gait & Station: untested in bed today Patient leans: N/A  Psychiatric Specialty Exam: Physical Exam Vitals reviewed.  HENT:     Head: Normocephalic.  Pulmonary:     Effort: Pulmonary effort is normal.     ROS - would not cooperate for questioning      Vitals:   10/06/20 0700 10/06/20 0758  BP: (!) 107/52 (!) 124/54  Pulse: 68 (!) 54  Resp:    Temp: 98.1 F (36.7 C)   SpO2:      General Appearance: obese, in hospital gown - disheveled appearing  Eye Contact:  Poor  Speech:  Mumbling quality   Volume:  soft  Mood:  Sedated, ambivalent   Affect:  sedated  Thought Process:  Concrete  Orientation:  Would not cooperate for questioning  Thought Content:  Denies AVH but would not cooperate for further questioning - does not appear to be responding to internal/external stimuli on exam  Suicidal Thoughts:  No  Homicidal Thoughts:  No  Memory: Poor  Judgement:  Limited  Insight:  Lacking  Psychomotor Activity:  Decreased   Concentration:  Poor  Recall:  Poor  Fund of Knowledge:  Limited   Language:  Fair  Akathisia:  Negative  Assets:  Desire for  Improvement Resilience  ADL's:  Needs prompts and assistance  Sleep:  5.35 hours    Mental Status Per Nursing Assessment::   On Admission:  Suicidal ideation indicated by patient,Self-harm thoughts  Demographic Factors:  Age 70 or older, Caucasian, Living alone and Unemployed  Loss Factors: Decline in physical health  Historical Factors: Prior psychiatric diagnoses/treatments  Risk Reduction Factors:   Sense of responsibility to family  Continued Clinical Symptoms:  Bipolar Disorder:   Depressive phase More than one psychiatric diagnosis Previous Psychiatric Diagnoses and Treatments  Cognitive Features That Contribute To Risk:  Loss of executive function    Suicide Risk:  Minimal: No identifiable suicidal ideation.  Patients presenting with no risk factors but with morbid ruminations; may be classified as minimal risk based on the severity of the depressive symptoms   Follow-up Information    [facility] Go on 10/17/2020.   Why: You have an appointment with your primary care provider Dr. Welton Flakes on 10/17/20 at 9:45 am for medication management.  This appointment will be held in person. Contact information: [address] [place] [address] [phone]        Go to  [facility].   Why: As needed, If symptoms worsen Contact information: [address] [place] [address] [phone]              Plan Of Care/Follow-up recommendations:  Patient transferred to ED in anticipation of inpatient hospital admission at this time.   Comer Locket, MD, Celene Skeen  10/07/2020, 2:36 PM

## 2020-10-07 NOTE — Consult Note (Signed)
Medical Consultation   Julie DamesSusan B Morrow  WUJ:[id number]  DOB: 12-Nov-1950  DOA: 09/19/2020  PCP: Patient, No Pcp Per (Inactive)    Requesting physician: Dr. Lucianne MussKumar  Reason for consultation: Medical clearance   History of Present Illness: This is a 70 y.o. female with a past medical history of bipolar 1 disorder, type 2 diabetes, hypothyroidism, hypertension, hyperlipidemia who [facility] ED [facility] with severe depression and suicidal ideation.  She was evaluated by psychiatry and deemed appropriate for inpatient psychiatric treatment and was [facility].  While hospitalized she had multiple unwitnessed falls and seemed to have some confusion overnight between 5/11-5/12 and [facility].  Imaging and work-up at the time was unremarkable for fracture and noted to be alert and oriented and was [facility].  On 5/13 she was evaluated by OT who deemed the patient not appropriate to return home and live independently due to safety risk, cognitive impairments and inability to safely engage in ADLs and was recommended to discharge to SNF.  She was started on Macrobid for a UTI on 5/15.  She has had not had much improvement since being treated for UTI.   Upon further discussion with Dr. Mason JimSingleton, psychiatry, the patient was relatively independent in terms of ADLs prior to [facility] but [facility] she has had a rapid and progressive decline in functionality despite decreasing her risperidone.  She also notes that her Depakote has been increased over the past week in order to return to her PTA dosage.  Dr. Mason JimSingleton also reported sacral pressure ulcers.  On her most recent OT assessment yesterday she requires maximum cues and was noted that she had slurred speech, and impaired insight.  Physical therapy has also noted that she now requires mod 2/max assist. Of note, she was placed on  Xarelto several days ago for DVT prophylaxis.  Per rounding note today, at roughly 7 AM the patient was minimally cooperative after the patient was noted to be restless overnight though she did not get sleep until roughly 4 AM and received trazodone.    Today, patient was [facility] in [facility] sitting upright in her wheelchair.  States that she feels weak in her legs bilaterally but does not have any specific complaints of chest pain, shortness of breath or any other issues.  Aide at bedside notes the patient has had right lower extremity weakness.  Aide also mentions that she has had difficulty swallowing as of lately.   Review of Systems:  Review of Systems  All other systems reviewed and are negative.  As per HPI otherwise 10 point review of systems negative.     Past Medical History: Past Medical History:  Diagnosis Date  . Anxiety   . Bipolar 1 disorder (HCC)   . Bradycardia   . Depression   . Diabetes mellitus, type II (HCC)    Patient takes Glucotrol and Januvia  . Hypothyroidism   . Lithium toxicity     Past Surgical History: Past Surgical History:  Procedure Laterality Date  . ABDOMINAL HYSTERECTOMY    . CHOLECYSTECTOMY       Allergies:  No Known Allergies   Social History:  reports that she has been smoking cigarettes. She started smoking about 35 years ago. She has a 10.00 pack-year smoking history. She  has never used smokeless tobacco. She reports that she does not drink alcohol and does not use drugs.   Family History: Family History  Problem Relation Age of Onset  . Dementia Mother   . Arthritis Father      Physical Exam: Vitals:   10/05/20 0617 10/06/20 0700 10/06/20 0758 10/07/20 0954  BP: (!) 153/64 (!) 107/52 (!) 124/54 122/64  Pulse: 71 68 (!) 54 65  Resp:    14  Temp: (!) 97.5 F (36.4 C) 98.1 F (36.7 C)  98.3 F (36.8 C)  TempSrc: Oral Oral    SpO2: 92%   95%  Weight:      Height:        Physical Exam Vitals and nursing note  reviewed. Exam conducted with a chaperone present.  Constitutional:      General: She is not in acute distress.    Comments: Slowed speech and mentation  HENT:     Head: Normocephalic.     Mouth/Throat:     Mouth: Mucous membranes are dry.  Eyes:     Conjunctiva/sclera: Conjunctivae normal.     Pupils: Pupils are equal, round, and reactive to light.  Cardiovascular:     Rate and Rhythm: Normal rate and regular rhythm.     Heart sounds: No murmur heard.   Pulmonary:     Effort: Pulmonary effort is normal. No respiratory distress.     Breath sounds: No wheezing.     Comments: Bibasilar Rales, right> left Abdominal:     General: Abdomen is flat.     Palpations: Abdomen is soft.     Tenderness: There is abdominal tenderness in the epigastric area.  Musculoskeletal:        General: No swelling or tenderness.  Neurological:     Mental Status: She is alert.       Data reviewed:  I have personally reviewed following labs and imaging studies Labs:  CBC: Recent Labs  Lab 10/02/20 0630 10/06/20 0721  WBC 6.6 7.0  NEUTROABS 3.7 4.2  HGB 13.2 13.7  HCT 40.1 42.2  MCV 94.4 95.9  PLT 163 167    Basic Metabolic Panel: No results for input(s): NA, K, CL, CO2, GLUCOSE, BUN, CREATININE, CALCIUM, MG, PHOS in the last 168 hours. GFR Estimated Creatinine Clearance: 65.4 mL/min (by C-G formula based on SCr of 0.91 mg/dL). Liver Function Tests: Recent Labs  Lab 10/02/20 0630  AST 15  ALT 14  ALKPHOS 43  BILITOT 0.5  PROT 6.0*  ALBUMIN 3.5   No results for input(s): LIPASE, AMYLASE in the last 168 hours. No results for input(s): AMMONIA in the last 168 hours. Coagulation profile Recent Labs  Lab 10/06/20 0721  INR 1.2    Cardiac Enzymes: No results for input(s): CKTOTAL, CKMB, CKMBINDEX, TROPONINI in the last 168 hours. BNP: Invalid input(s): POCBNP CBG: Recent Labs  Lab 10/05/20 1148 10/05/20 1659 10/05/20 1751 10/06/20 0536 10/07/20 0630  GLUCAP 90 99 99 96  110*   D-Dimer No results for input(s): DDIMER in the last 72 hours. Hgb A1c No results for input(s): HGBA1C in the last 72 hours. Lipid Profile No results for input(s): CHOL, HDL, LDLCALC, TRIG, CHOLHDL, LDLDIRECT in the last 72 hours. Thyroid function studies No results for input(s): TSH, T4TOTAL, T3FREE, THYROIDAB in the last 72 hours.  Invalid input(s): FREET3 Anemia work up No results for input(s): VITAMINB12, FOLATE, FERRITIN, TIBC, IRON, RETICCTPCT in the last 72 hours. Urinalysis    Component Value Date/Time  COLORURINE YELLOW 10/01/2020 1445   APPEARANCEUR CLEAR 10/01/2020 1445   APPEARANCEUR Cloudy 10/30/2012 1857   LABSPEC 1.012 10/01/2020 1445   LABSPEC 1.019 10/30/2012 1857   PHURINE 5.0 10/01/2020 1445   GLUCOSEU NEGATIVE 10/01/2020 1445   GLUCOSEU Negative 10/30/2012 1857   HGBUR NEGATIVE 10/01/2020 1445   BILIRUBINUR NEGATIVE 10/01/2020 1445   BILIRUBINUR Negative 10/30/2012 1857   KETONESUR NEGATIVE 10/01/2020 1445   PROTEINUR NEGATIVE 10/01/2020 1445   NITRITE NEGATIVE 10/01/2020 1445   LEUKOCYTESUR NEGATIVE 10/01/2020 1445   LEUKOCYTESUR Trace 10/30/2012 1857     Microbiology Recent Results (from the past 240 hour(s))  Urine Culture     Status: Abnormal   Collection Time: 09/29/20 10:01 AM   Specimen: Urine, Clean Catch  Result Value Ref Range Status   Specimen Description   Final    URINE, CLEAN CATCH Performed at [facility], [address], [place], [place] [address]    Special Requests   Final    NONE Performed at [facility], [address], [place], [place] [address]    Culture MULTIPLE SPECIES PRESENT, SUGGEST RECOLLECTION (A)  Final   Report Status 10/01/2020 FINAL  Final       Inpatient Medications:   Scheduled Meds: . aspirin EC  81 mg Oral Daily  . benztropine  0.5 mg Oral BID  . bisacodyl  5 mg Oral QHS  . buPROPion  300 mg Oral Daily  . divalproex  1,000 mg Oral QHS  . fenofibrate   160 mg Oral Daily  . hydrocortisone   Rectal TID  . levothyroxine  175 mcg Oral Q0600  . loratadine  10 mg Oral Daily  . metFORMIN  500 mg Oral Q breakfast  . nicotine  14 mg Transdermal Daily  . nystatin   Topical TID  . polyethylene glycol  17 g Oral Daily  . risperiDONE  3 mg Oral QHS  . Rivaroxaban  15 mg Oral BID WC   Followed by  . [START ON 10/26/2020] rivaroxaban  20 mg Oral Q supper  . rosuvastatin  40 mg Oral Daily   Continuous Infusions:   Radiological Exams on Admission: No results found.  Impression/Recommendations Principal Problem:   Major neurocognitive disorder (HCC) Active Problems:   HLD (hyperlipidemia)   Hypothyroidism   Bipolar depression (HCC)   Diabetes (HCC)    1. Failure to thrive  rapid decline in functional status of unclear etiology a. Possibly medication induced vs. Infection vs. electrolyte/renal dysfunction vs CVA b. Relatively independent at home, currently requiring max of 2 assist to ambulate with wheelchair and RLE > LLE weakness c. Depakote has been increased during stay at [facility] in an attempt to return to her prior to arrival doses though her Risperdal has been decreased d. She will need lab work (CBC, CMP, Mg, Ammonia, UA) and imaging (at least a CXR with her rales and possible CT head in the ED) and will need medical optimization prior to discharge to SNF  e. Psychiatry agrees to follow along daily  2. Bipolar 1 disorder with severe depression and suicidal ideation a. Per psychiatry, she is doing well from this standpoint b. Psychiatry to follow daily and adjust medications  3. Type 2 diabetes a. POC glucose at facility stable  4. Hyperlipidemia a. On statin and fenofibrate  5. Hypothyroidism a. TSH 10/01/2020: 0.411 b. On Synthroid 175 mcg   Thank you for this consultation.  Recommend transfer to the ED and at least hospitalist observation  overnight   Time Spent: 80 minutes  Jae Dire D.O.  [facility]  Hospitalist 10/07/2020, 11:01 AM

## 2020-10-07 NOTE — B[facility] Counselor (Signed)
CSW spoke with Mrs. Clapp who was asking for an update about Ms. Lorenzi.  CSW provided Mrs. Clapp with an update and let her know about Ms. Fessel's transfer to [facility] this morning.  Mrs. Coralee North states that she has been continuing to pay the lights and cable bills for Ms. Repetto and is not sure how to handle these bills while Ms. Baumbach is in the hospital.  CSW provided. Mrs. Clapp with the information to contact the APS worker and find out what needs to be done with those bills and Ms. Binns's money.  CSW directed Mrs. Clapp to contact [facility] over the next 2 days to check in on Ms. Kaestner.  CSW informed Mrs. Coralee North that if there was a changed in hospitals again she would let Mrs. Clapp know.

## 2020-10-07 NOTE — Discharge Summary (Addendum)
Physician Discharge Summary Note  Patient:  Julie Morrow is an 70 y.o., female MRN:  [id number] DOB:  1950-12-07 Patient phone:  [phone] (home)  Patient address:   [address] [place] [id number],  Total Time spent with patient: 30 minutes  Date of Admission:  09/19/2020 Date of Discharge: 10/07/2020  Reason for Admission:  (From MD's admission note): Patient is a 70 year old female with a reported past psychiatric history significant for bipolar disorder, anxiety disorder, depression who presented to the [facility] emergency department on 09/13/2020 after having been dropped off by her sister.  The patient initially stated that she had had anxiety for 2 weeks prior to admission and stated "I cannot take it anymore".  In the emergency department she was evaluated by the emergency room physician and stated that she had not been sleeping.  She stated she had not been sleeping well over the last 2 weeks.  Psychiatric consultation was obtained on 4/30.  She again reported that she was anxious and was not sleeping.  She had reported her depression was low to moderate without suicidal ideation.  [facility].  Initial consultation wrote for hydroxyzine and that the patient could be discharged.  This was on 4/30.  A comprehensive clinical assessment was done shortly after that and agreed that the patient could be discharged to home.  She then presented again on 09/16/2020.  She was dropped off by her sister again, and the patient stated she was suicidal and had been for the last 2 to 3 weeks.  She stated she was taking medication but apparently this was not effective.  The comprehensive clinical assessment at that time on 5/3 reinforced that she was suicidal, had been that way for 2 to 3 weeks.  Reportedly she had not been psychiatrically hospitalized in the recent past, and is [facility]  She reported not eating  or sleeping, and reportedly lost approximately 16 pounds.  She was seen by psychiatric consultation on 09/19/2020.  At that time she continued to endorse depressive symptoms as well as suicidal ideations, occasional hallucinations, hopelessness as well as dysphoria.  It did note that she was "fully ambulatory" and that she had "no sign of dementia".  Unfortunately since she is arrived at our facility she has required a wheelchair.  That note stated that the patient had been receiving Depakote and Risperdal while in [facility].  Reportedly she had also recently moved into her own independent living situation which the evaluating psychiatrist thought was a major stressor.  On examination today the patient is not a very good historian.  She does not know any of her medications.  She is unable to remember whether or not she has been psychiatrically hospitalized in the last several years.  She stated that she was hospitalized several years ago.  She is alert and oriented x3, but only able to remember [facility].  She is in a wheelchair, and currently unable to ambulate.  She does admit to depression.  She was admitted to the hospital for evaluation and stabilization.  Review of the electronic medical record revealed an involuntary commitment from 12/03/2019.  She reportedly had manic and depressive symptoms at that time.  Her list of medications at that point included Celexa 20 mg p.o. daily, Trileptal 300 mg p.o. twice daily and Risperdal 6 mg p.o. nightly.  A medical hospitalization from 05/21/2019 revealed  that the patient was being sent to a skilled nursing facility.  Prior to admission her medications included Cogentin, losartan, Seroquel and temazepam.  Her psychiatric medications at discharge included citalopram, gabapentin, Trileptal and Risperdal.  Review of the electronic medical record also revealed her last psychiatric hospitalization within our system  was in August 2020.  She was admitted to [facility] at that time because of an agitated incident with aggression.  Her diagnosis at that time included bipolar disorder type I with mania.  Her discharge medications at that time included Cogentin, Risperdal.  She was transferred to our facility for evaluation and stabilization.    Principal Problem: Major neurocognitive disorder [facility]) Discharge Diagnoses: Principal Problem:   Major neurocognitive disorder (HCC) Active Problems:   HLD (hyperlipidemia)   Hypothyroidism   Bipolar depression (HCC)   Diabetes (HCC)   Past Psychiatric History: See H&P  Past Medical History:  Past Medical History:  Diagnosis Date   Anxiety    Bipolar 1 disorder (HCC)    Bradycardia    Depression    Diabetes mellitus, type II (HCC)    Patient takes Glucotrol and Januvia   Hypothyroidism    Lithium toxicity     Past Surgical History:  Procedure Laterality Date   ABDOMINAL HYSTERECTOMY     CHOLECYSTECTOMY     Family History:  Family History  Problem Relation Age of Onset   Dementia Mother    Arthritis Father    Family Psychiatric  History: See H&P Social History:  Social History   Substance and Sexual Activity  Alcohol Use No   Alcohol/week: 0.0 standard drinks     Social History   Substance and Sexual Activity  Drug Use No    Social History   Socioeconomic History   Marital status: Single    Spouse name: Not on file   Number of children: Not on file   Years of education: Not on file   Highest education level: Not on file  Occupational History   Not on file  Tobacco Use   Smoking status: Current Every Day Smoker    Packs/day: 0.50    Years: 20.00    Pack years: 10.00    Types: Cigarettes    Start date: 11/06/1984   Smokeless tobacco: Never Used  Vaping Use   Vaping Use: Never used  Substance and Sexual Activity   Alcohol use: No    Alcohol/week: 0.0 standard drinks   Drug use: No   Sexual activity:  Not Currently  Other Topics Concern   Not on file  Social History Narrative   Not on file   Social Determinants of Health   Financial Resource Strain: Not on file  Food Insecurity: Not on file  Transportation Needs: Not on file  Physical Activity: Not on file  Stress: Not on file  Social Connections: Not on file    Hospital Course:  Patient was admitted to the 400 hall on 09/20/2020 for medication management and stabilization. She Patient has been medication compliant during her hospitalization. She apparently was able to take care of herself before being admitted, however soon after she arrived she was noted to require 2 person assist to transfer from bed to chair. She is unable to perform her own ADL's. She has bleeding hemorrhoids. She is incontinent. Her strength has decreased since her admission. She was started on Xarelto for her lack of mobility and concern for her history of DVT and/or pulmonary emboli. Her memory is poor. She  asks every day about going home and every day is reminded about guardianship and placement. APS is applying for guardianship. She was evaluated by APS and she is not able to live on her own. She completed a course of antibiotics for a urinary tract infection on 10/05/2020.  She has denied SI/HI/AVH and depression since her admission to [facility]. She does not appear to be responding to internal stimuli. Patient is being discharged to [facility] ED for medical evaluation and observation after Whitney Post, MD was consulted to see the patient today. Patient requires a level of care and assistance that can not be provided at [facility]. Patient was discharged and transported to [facility] via EMS. (Please read notes in patient's chart for further detail of this long and complicated [facility] admission).   Physical Findings: AIMS: Facial and Oral Movements Muscles of Facial Expression: None, normal Lips and Perioral Area: None, normal Jaw: None, normal Tongue: None,  normal,Extremity Movements Upper (arms, wrists, hands, fingers): None, normal Lower (legs, knees, ankles, toes): None, normal, Trunk Movements Neck, shoulders, hips: None, normal, Overall Severity Severity of abnormal movements (highest score from questions above): None, normal Incapacitation due to abnormal movements: None, normal Patient's awareness of abnormal movements (rate only patient's report): No Awareness, Dental Status Current problems with teeth and/or dentures?: No Does patient usually wear dentures?: No  CIWA:    COWS:     Musculoskeletal: Strength & Muscle Tone: within normal limits Gait & Station: normal Patient leans: N/A  Psychiatric Specialty Exam:  Presentation  General Appearance: Fairly Groomed  Eye Contact:Fair  Speech:Normal Rate  Speech Volume:Normal  Handedness:Right  Mood and Affect  Mood:Euthymic  Affect:Congruent  Thought Process  Thought Processes:Goal Directed  Descriptions of Associations:Circumstantial  Orientation:Partial  Thought Content:Rumination  History of Schizophrenia/Schizoaffective disorder:Yes  Duration of Psychotic Symptoms:No data recorded Hallucinations:Hallucinations: None  Ideas of Reference:None  Suicidal Thoughts:Suicidal Thoughts: No  Homicidal Thoughts:Homicidal Thoughts: No   Sensorium  Memory:Immediate Fair; Recent Fair; Remote Fair  Judgment:Fair  Insight:Lacking   Executive Functions  Concentration:Fair  Attention Span:Fair  Recall:Poor  Fund of Knowledge:Poor  Language:Fair  Psychomotor Activity  Psychomotor Activity:Psychomotor Activity: Decreased  Assets  Assets:Desire for Improvement; Resilience  Sleep  Sleep:Sleep: Good Number of Hours of Sleep: 6.25  Physical Exam: Physical Exam Constitutional:      Appearance: Normal appearance.  HENT:     Head: Normocephalic.  Pulmonary:     Effort: Pulmonary effort is normal.  Musculoskeletal:        General: Normal range of  motion.     Cervical back: Normal range of motion.  Neurological:     Mental Status: She is alert.    ROS Blood pressure 122/64, pulse 65, temperature 98.3 F (36.8 C), resp. rate 14, height 5\' 1"  (1.549 m), weight 105.7 kg, SpO2 95 %. Body mass index is 44.02 kg/m.   Have you used any form of tobacco in the last 30 days? (Cigarettes, Smokeless Tobacco, Cigars, and/or Pipes): Yes  Has this patient used any form of tobacco in the last 30 days? (Cigarettes, Smokeless Tobacco, Cigars, and/or Pipes) Yes, N/A  Blood Alcohol level:  Lab Results  Component Value Date   [facility] <10 09/16/2020   ETH <10 12/03/2019    Metabolic Disorder Labs:  Lab Results  Component Value Date   HGBA1C 6.5 (H) 09/16/2020   MPG 139.85 09/16/2020   MPG 128.37 12/28/2018   Lab Results  Component Value Date   PROLACTIN 15.1 07/03/2015   Lab Results  Component Value Date   CHOL 106 09/16/2020   TRIG 315 (H) 09/16/2020   HDL 28 (L) 09/16/2020   CHOLHDL 3.8 09/16/2020   VLDL 63 (H) 09/16/2020   LDLCALC 15 09/16/2020   LDLCALC 38 12/28/2018    See Psychiatric Specialty Exam and Suicide Risk Assessment completed by Attending Physician prior to discharge.  Discharge destination:  Other:  [facility]  Is patient on multiple antipsychotic therapies at discharge:  Yes,   Do you recommend tapering to monotherapy for antipsychotics?  No   Has Patient had three or more failed trials of antipsychotic monotherapy by history:  No  Recommended Plan for Multiple Antipsychotic Therapies: Additional reason(s) for multiple antispychotic treatment:  Mood stabilization  No current facility-administered medications for this encounter. No current outpatient medications on file.  Facility-Administered Medications Ordered in Other Encounters:    acetaminophen (TYLENOL) tablet 650 mg, 650 mg, Oral, Q6H PRN **OR** acetaminophen (TYLENOL) suppository 650 mg, 650 mg, Rectal, Q6H PRN, Jae Dire, MD    aspirin chewable tablet 81 mg, 81 mg, Oral, Daily, Jae Dire, MD   benztropine (COGENTIN) tablet 0.5 mg, 0.5 mg, Oral, BID, Jae Dire, MD   buPROPion (WELLBUTRIN XL) 24 hr tablet 300 mg, 300 mg, Oral, Daily, Jae Dire, MD   divalproex (DEPAKOTE ER) 24 hr tablet 750 mg, 750 mg, Oral, QHS, Whitney Post E, MD   enoxaparin (LOVENOX) injection 40 mg, 40 mg, Subcutaneous, Q24H, Jae Dire, MD   [START ON 10/08/2020] levothyroxine (SYNTHROID) tablet 150 mcg, 150 mcg, Oral, Daily, Jae Dire, MD   polyethylene glycol (MIRALAX / GLYCOLAX) packet 17 g, 17 g, Oral, Daily PRN, Jae Dire, MD   risperiDONE (RISPERDAL) tablet 2 mg, 2 mg, Oral, QHS, Jae Dire, MD   rosuvastatin (CRESTOR) tablet 40 mg, 40 mg, Oral, Daily, Whitney Post E, MD   sodium chloride flush (NS) 0.9 % injection 3 mL, 3 mL, Intravenous, Q12H, Jae Dire, MD   traZODone (DESYREL) tablet 150 mg, 150 mg, Oral, QHS PRN, Jae Dire, MD    Follow-up Information     [facility] Go on 10/17/2020.   Why: You have an appointment with your primary care provider Dr. Welton Flakes on 10/17/20 at 9:45 am for medication management.  This appointment will be held in person. Contact information: [address] [place] [address] [phone]         Go to  [facility].   Why: As needed, If symptoms worsen Contact information: [address] [place] [address] [phone]                Follow-up recommendations:  Activity:  as tolerated Diet:  Heart healthy  Comments:  Patient discharged to [facility] Emergency room for medical evaluation.   Signed: Laveda Abbe, NP 10/07/2020, 3:30 PM I have reviewed the patient's chart and discussed the case with the APP. I agree with the assessment and plan of care as documented in the APP's note. I have independently assessed the patient and completed a separate suicide risk assessment for  discharge.   Bartholomew Crews, MD, Celene Skeen

## 2020-10-07 NOTE — BHH Counselor (Signed)
CSW spoke with Dr. Dairl Ponder who came to [facility] by request of Dr. Mason Jim to examine Julie Morrow and see if she needed to be transferred to a medical floor for additional care.   Dr. Dairl Ponder examined Julie Morrow, listened to her breathing, and checked Julie Morrow's legs.  He also asked Julie Morrow, the CSW, Dr. Mason Jim, and the Mental Health Tech some questions about Julie Morrow.  Dr. Dairl Ponder determined that Julie Morrow needed to be admitted to the hospital through the Emergency Department.  Dr. Mason Jim and the Nurse [facility], Mardella Layman contacted the Emergency Department and EMS to have Julie Morrow transported.  CSW helped the MHT gather Julie Morrow personal items to be sent with her.  Julie Morrow was sent to the Emergency Department due to her declining health and a need for labs and X-rays.  CSW and Nurse Will have informed Dr. Mason Jim and CSW's supervisor of the events that took place today and of Julie Morrow's health concerns.  Dr. Mason Jim also spoke with Dr. Lucianne Muss and provided her with an update as well.

## 2020-10-07 NOTE — Plan of Care (Signed)
I discussed the case with Dr. Dairl Ponder, the on-call hospitalist, and at this time he recommends that patient be sent to the ED for plans for admission to internal medicine for observation and additional labs. There are no current beds available for direct admission. Dr. Dairl Ponder questions if her recent decompensation is partly due to her current medications, and we discussed reducing her Risperdal to 2mg  and VPA ER to 750mg  in the event these are contributing to her current sedation.   , MD, 

## 2020-10-07 NOTE — B[facility] Group Notes (Signed)
Adult Psychoeducational Group Note  Date:  10/07/2020 Time:  8:56 AM  Group Topic/Focus:  Goals Group:   The focus of this group is to help patients establish daily goals to achieve during treatment and discuss how the patient can incorporate goal setting into their daily lives to aide in recovery.   Additional Comments: Pt's sitter was given group materials as well as the self inventory so it could be completed a more opportune time.  Julie Morrow 10/07/2020, 8:56 AM

## 2020-10-07 NOTE — H&P (Addendum)
History and Physical        Hospital Admission Note Date: 10/07/2020  Patient name: Julie Morrow Medical record number: [id number] Date of birth: 03-21-1951 Age: 70 y.o. Gender: female  PCP: Patient, No Pcp Per (Inactive)    Chief Complaint    Chief Complaint  Patient presents with  . Failure To Thrive      HPI:    Patient was seen earlier today by myself in consult.   This is a 70 y.o. female with a past medical history of bipolar 1 disorder, type 2 diabetes, hypothyroidism, hypertension, hyperlipidemia who initially presented to [facility] ED on 5/3 with severe depression and suicidal ideation.  She was seen earlier today by me in consult at [facility] with recommendation to be further evaluated in the ED. She was evaluated by psychiatry and deemed appropriate for inpatient psychiatric treatment and was discharged from [facility] ED to [facility] on 09/19/2020.  While hospitalized she had multiple unwitnessed falls and seemed to have some confusion overnight between 5/11-5/12 and was transported to the [facility] ED for evaluation.  Imaging and work-up at the time was unremarkable for fracture and noted to be alert and oriented and was discharged on 5/12 back to [facility].  On 5/13 she was evaluated by OT who deemed the patient not appropriate to return home and live independently due to safety risk, cognitive impairments and inability to safely engage in ADLs and was recommended to discharge to SNF.  She was started on Macrobid for a UTI on 5/15.  She has had not had much improvement since being treated for UTI. Of note, she was placed on Xarelto several days ago for DVT prophylaxis.  Upon further discussion with Dr. Mason Jim, psychiatry, the patient was relatively independent in terms of ADLs prior to arrival to [facility] but since being at [facility] she has had a rapid and progressive decline in functionality despite decreasing  her risperidone.  She also notes that her Depakote has been increased over the past week in order to return to her PTA dosage.  Dr. Mason Jim also reported sacral pressure ulcers.  On her most recent OT assessment yesterday, she required maximum cues and was noted that she had slurred speech, and impaired insight.  Physical therapy has also noted that she now requires mod 2/max assist.   Per rounding note today, at roughly 7 AM the patient was minimally cooperative after the patient was noted to be restless overnight and did not get sleep until roughly 4 AM and received trazodone.    Today in consult at [facility], she was sitting upright in her wheelchair.  States that she feels weak in her legs bilaterally but does not have any specific complaints of chest pain, shortness of breath or any other issues.  Aide at bedside notes the patient has had right lower extremity weakness.  Aide also mentions that she has had difficulty swallowing as of lately but unclear if she has aspirated.  She was sent to [facility] for further work-up and observation of her change in mental status and failure to thrive.  Currently, patient does not recall seeing me earlier today at [facility].  She is also using the bedside remote as a telephone.  She  denies any new complaints since this morning.    ED Course: Afebrile, bradycardic,  hemodynamically stable, on room air. Notable Labs: Ammonia elevated: 43, valproic acid level 38, COVID 19 pending otherwise labs unremarkable. Notable Imaging: CXR and CT head pending. Patient received 1L NS bolus and lactulose x 1.    Vitals:   10/07/20 1451 10/07/20 1500  BP: 126/68 126/72  Pulse: 60 (!) 55  Resp: (!) 22 18  Temp:    SpO2: 94% 96%     Review of Systems:  Review of Systems  All other systems reviewed and are negative.   Medical/Social/Family History   Past Medical History: Past Medical History:  Diagnosis Date  . Anxiety   . Bipolar 1 disorder (HCC)   . Bradycardia   .  Depression   . Diabetes mellitus, type II (HCC)    Patient takes Glucotrol and Januvia  . Hypothyroidism   . Lithium toxicity     Past Surgical History:  Procedure Laterality Date  . ABDOMINAL HYSTERECTOMY    . CHOLECYSTECTOMY      Medications: Prior to Admission medications   Medication Sig Start Date End Date Taking? Authorizing Provider  aspirin 81 MG chewable tablet Chew 1 tablet (81 mg total) by mouth daily. Patient not taking: No sig reported 01/05/19   Clapacs, Jackquline Denmark, MD  CINNAMON PO Take 1 tablet by mouth daily.    [provider]  divalproex (DEPAKOTE ER) 250 MG 24 hr tablet Take 750 mg by mouth at bedtime. 10/01/19   [provider]  fenofibrate 160 MG tablet Take 1 tablet (160 mg total) by mouth daily with breakfast. Patient not taking: No sig reported 01/05/19   Clapacs, John T, MD  FEROSUL 325 (65 Fe) MG tablet Take 325 mg by mouth daily. 10/01/19   [provider]  hydrOXYzine (ATARAX/VISTARIL) 10 MG tablet Take 10 mg by mouth 3 (three) times daily as needed. Take 10 mg by mouth three times daily as needed for anxiety and 30 mg at bedtime as needed for sleep 09/13/20   [provider]  levothyroxine (SYNTHROID) 175 MCG tablet Take 175 mcg by mouth daily. 11/02/19   [provider]  levothyroxine (SYNTHROID) 200 MCG tablet Take 1 tablet (200 mcg total) by mouth daily before breakfast. Patient not taking: No sig reported 01/05/19   Clapacs, Jackquline Denmark, MD  loratadine (CLARITIN) 10 MG tablet Take 1 tablet (10 mg total) by mouth daily. Patient not taking: No sig reported 01/05/19   Clapacs, Jackquline Denmark, MD  losartan (COZAAR) 25 MG tablet Take 25 mg by mouth daily. 08/26/20   [provider]  melatonin 5 MG TABS Take 5-10 mg by mouth at bedtime.    [provider]  metFORMIN (GLUCOPHAGE) 500 MG tablet Take 1 tablet (500 mg total) by mouth daily with breakfast. Patient not taking: No sig reported 01/05/19   Clapacs, Jackquline Denmark, MD   Multiple Vitamins-Minerals (CENTRUM) tablet Take 1 tablet by mouth daily.    [provider]  polyvinyl alcohol (LIQUIFILM TEARS) 1.4 % ophthalmic solution Place 1 drop into both eyes as needed for dry eyes.    [provider]  Probiotic Product (PROBIOTIC PO) Take 1 capsule by mouth daily.    [provider]  risperiDONE (RISPERDAL) 3 MG tablet Take 2 tablets (6 mg total) by mouth at bedtime. Patient not taking: No sig reported 01/05/19   Clapacs, Jackquline Denmark, MD  rosuvastatin (CRESTOR) 40 MG tablet Take 40 mg by  mouth daily. 12/31/18   [provider]  RYBELSUS 14 MG TABS Take 1 tablet by mouth every morning. 08/26/20   [provider]  traZODone (DESYREL) 50 MG tablet Take 50 mg by mouth at bedtime as needed for sleep. 10/24/19   [provider]    Allergies:  No Known Allergies  Social History:  reports that she has been smoking cigarettes. She started smoking about 35 years ago. She has a 10.00 pack-year smoking history. She has never used smokeless tobacco. She reports that she does not drink alcohol and does not use drugs.  Family History: Family History  Problem Relation Age of Onset  . Dementia Mother   . Arthritis Father      Objective   Physical Exam: Blood pressure 126/72, pulse (!) 55, temperature 98 F (36.7 C), temperature source Oral, resp. rate 18, height 5\' 1"  (1.549 m), weight 106 kg, SpO2 96 %.  Physical Exam Vitals and nursing note reviewed.  Constitutional:      Appearance: Normal appearance.  HENT:     Head: Normocephalic and atraumatic.  Eyes:     Conjunctiva/sclera: Conjunctivae normal.  Cardiovascular:     Rate and Rhythm: Normal rate and regular rhythm.  Pulmonary:     Effort: Pulmonary effort is normal.     Breath sounds: Normal breath sounds.  Abdominal:     General: Abdomen is flat.     Palpations: Abdomen is soft.     Tenderness: There is abdominal tenderness in the epigastric area.   Musculoskeletal:        General: No swelling or tenderness.  Skin:    Coloration: Skin is not jaundiced or pale.  Neurological:     Mental Status: She is alert.     Comments: Oriented x 2, not to place  Psychiatric:        Mood and Affect: Mood normal.        Behavior: Behavior is cooperative.     Comments: does [facility].  She is also using the bedside remote as a telephone.     LABS on Admission: I have personally reviewed all the labs and imaging below    Basic Metabolic Panel: Recent Labs  Lab 10/07/20 1329  NA 138  K 4.7  CL 98  CO2 30  GLUCOSE 121*  BUN 15  CREATININE 1.01*  CALCIUM 9.7   Liver Function Tests: Recent Labs  Lab 10/02/20 0630 10/07/20 1329  AST 15 19  ALT 14 14  ALKPHOS 43 52  BILITOT 0.5 0.4  PROT 6.0* 7.3  ALBUMIN 3.5 3.9   No results for input(s): LIPASE, AMYLASE in the last 168 hours. Recent Labs  Lab 10/07/20 1329  AMMONIA 43*   CBC: Recent Labs  Lab 10/06/20 0721 10/07/20 1329  WBC 7.0 7.4  NEUTROABS 4.2 4.5  HGB 13.7 14.4  HCT 42.2 44.8  MCV 95.9 96.3  PLT 167 205   Cardiac Enzymes: No results for input(s): CKTOTAL, CKMB, CKMBINDEX, TROPONINI in the last 168 hours. BNP: Invalid input(s): POCBNP CBG: Recent Labs  Lab 10/06/20 0536 10/07/20 0630  GLUCAP 96 110*    Radiological Exams on Admission:  DG CHEST PORT 1 VIEW  Result Date: 10/07/2020 CLINICAL DATA:  Failure to thrive. EXAM: PORTABLE CHEST 1 VIEW COMPARISON:  Chest x-ray 05/14/2019. FINDINGS: Mediastinum and hilar structures normal. Heart size normal. No focal infiltrate. No pleural effusion or pneumothorax. No acute bony abnormality. IMPRESSION: No acute cardiopulmonary disease.  Electronically Signed   By: Maisie Fus  Register   On: 10/07/2020 15:02      EKG: Sinus rhythm, left axis deviation, otherwise similar to prior   A & P   Principal Problem:   Failure to thrive in adult Active Problems:   Diabetes type 2,  controlled (HCC)   Hypothyroidism   Bipolar 1 disorder, mixed, severe (HCC)   Hyperlipidemia   Acute encephalopathy   1. Failure to thrive  rapid decline in functional status  Acute encephalopathy of unclear etiology a. Possibly medication induced vs. Infection vs. electrolyte/renal dysfunction vs hepatic encephalopathy vs. CVA b. Relatively independent at home, currently requiring max of 2 assist to ambulate with wheelchair and RLE > LLE weakness c. Ammonia level slightly elevated  -> lactulose d. Depakote decreased by psych starting today e. CT head f. Follow up UA g. will need medical optimization prior to discharge to SNF -> TOC consult h. Discussed with Dr. Mason Jim, Psychiatry agrees to follow along daily  2. Bipolar 1 disorder with severe depression and suicidal ideation a. Per psychiatry, she is doing well from this standpoint b. Will continue decreased dose of Depakote and continue Risperdal, Wellbutrin XL, Cogentin and Trazodone PRN per psychiatry notes c. Psychiatry to follow daily and adjust medications  3. Type 2 diabetes a. HbA1c 6.5  b. Heart healthy/carb modified diet  4. Hyperlipidemia a. On statin and fenofibrate  5. Hypothyroidism a. TSH 10/01/2020: 0.411 b. On Synthroid 175 mcg  6. Rales 1. Not hypoxic, likely atelecatsis 2. CXR    DVT prophylaxis: lovenox (Hold Xarelto that was started for DVT prophylaxis at [facility])   Code Status: Full Code  Diet: heart healthy/carb modified Family Communication: Admission, patients condition and plan of care including tests being ordered have been discussed with the patient who indicates understanding and agrees with the plan and Code Status. Disposition Plan: The appropriate patient status for this patient is OBSERVATION. Observation status is judged to be reasonable and necessary in order to provide the required intensity of service to ensure the patient's safety. The patient's presenting symptoms, physical exam  findings, and initial radiographic and laboratory data in the context of their medical condition is felt to place them at decreased risk for further clinical deterioration. Furthermore, it is anticipated that the patient will be medically stable for discharge from the hospital within 2 midnights of admission. The following factors support the patient status of observation.   " The patient's presenting symptoms include failure to thrive, encephalopathy. " The physical exam findings include confusion. " The initial radiographic and laboratory data show elevated ammonia.      Consultants  . psychiatry  Procedures  . none  Time Spent on Admission: 68 minutes    Jae Dire, DO [facility]  10/07/2020, 3:30 PM

## 2020-10-07 NOTE — Progress Notes (Signed)
  [place] Adult Case Management Discharge Plan :  Will you be returning to the same living situation after discharge:  No. Emergency Department  At discharge, do you have transportation home?: Yes,  EMS  Do you have the ability to pay for your medications: Yes,  Medicare   Release of information consent forms completed and in the chart;  Patient's signature needed at discharge.  Patient to Follow up at:  Follow-up Information    [facility] Go on 10/17/2020.   Why: You have an appointment with your primary care provider Dr. Welton Flakes on 10/17/20 at 9:45 am for medication management.  This appointment will be held in person. Contact information: [address] [place] [address] [phone]        Go to  [facility]   Why: As needed, If symptoms worsen Contact information: [address] [place] [address] [phone]              Next level of care provider has access to [facility] Link:yes  Safety Planning and Suicide Prevention discussed: Yes,  with patient and sister  Have you used any form of tobacco in the last 30 days? (Cigarettes, Smokeless Tobacco, Cigars, and/or Pipes): Yes  Has patient been referred to the Quitline?: Patient refused referral  Patient has been referred for addiction treatment: N/A  Aram Beecham, LCSWA 10/07/2020, 12:27 PM

## 2020-10-07 NOTE — ED Triage Notes (Addendum)
Patient BIB [facility] for failure to thrive from [facility].  Patient has been at that facility since May 6th and was at [facility] before that with increased anxiety, depression, and SI.  Patient has not been doing well at [facility] and per EMS, [facility] has been trying to get a hospitalist over there to see her for the last 2 weeks, one did came over to see her today and it was thought that it was best for patient to come to the ER.  [facility] feels the patient had dementia and that is what was causing the SI however, patient has no hx of dementia.  Patient could walk with assistance when she was at [facility], now she cannot walk at all.  Vitals were 140/80 70-HR 94% on room air 16-RR

## 2020-10-07 NOTE — Progress Notes (Signed)
PT Cancellation Note  Patient Details Name: Julie Morrow MRN: [id number] DOB: 24-Jun-1950   Cancelled Treatment:    Reason Eval/Treat Not Completed: Medical issues which prohibited therapy, per notes, increased somnolence and possibly admit to hospital.  Will follow along.   Rada Hay 10/07/2020, 12:38 PM [facility] PT Acute Rehabilitation Services Pager [phone] Office [phone]

## 2020-10-07 NOTE — Progress Notes (Addendum)
[facility] MD Progress Note  10/07/2020 7:52 AM Julie Morrow  MRN:  [id number]   Chief Complaint: depression and SI  Subjective:  Julie Morrow is a 70 y.o. female with a past psychiatric history significant for bipolar d/o and anxiety, who was initially admitted for inpatient psychiatric hospitalization on 09/19/2020 for management of worsening depression and SI. The patient is currently on Hospital Day 18.   Chart Review from last 24 hours:  The patient's chart was reviewed and nursing notes were reviewed. The patient's case was discussed in multidisciplinary team meeting. Per nursing, she was in open areas of unit with 1:1 during the day some yesterday but continues to require assistance for toileting and ambulation. She attended some groups. Per sitter, she was restless overnight and did not get to sleep until 0400.  Per MAR, she was compliant with scheduled medications and did receive Trazodone X1 for sleep.   Information Obtained Today During Patient Interview: The patient was seen and evaluated on the unit with 1:1 present. The patient is minimally cooperative for an interview and is overly sedated today. She does awaken to name and touch and denies SI, HI, or AVH. She would not cooperate for additional questioning.    Principal Problem: Major neurocognitive disorder (HCC) Diagnosis: Principal Problem:   Major neurocognitive disorder (HCC) Active Problems:   HLD (hyperlipidemia)   Hypothyroidism   Bipolar depression (HCC)   Diabetes (HCC)  Total Time Spent in Direct Patient Care:  I personally spent 35 minutes on the unit in direct patient care. The direct patient care time included face-to-face time with the patient, reviewing the patient's chart, communicating with other professionals, and coordinating care. Greater than 50% of this time was spent in counseling or coordinating care with the patient regarding goals of hospitalization, psycho-education, and discharge planning needs.  Past  Psychiatric History: see admission H&P  Past Medical History:  Past Medical History:  Diagnosis Date  . Anxiety   . Bipolar 1 disorder (HCC)   . Bradycardia   . Depression   . Diabetes mellitus, type II (HCC)    Patient takes Glucotrol and Januvia  . Hypothyroidism   . Lithium toxicity     Past Surgical History:  Procedure Laterality Date  . ABDOMINAL HYSTERECTOMY    . CHOLECYSTECTOMY     Family History:  Family History  Problem Relation Age of Onset  . Dementia Mother   . Arthritis Father    Family Psychiatric  History: see admission H&P  Social History:  Social History   Substance and Sexual Activity  Alcohol Use No  . Alcohol/week: 0.0 standard drinks     Social History   Substance and Sexual Activity  Drug Use No    Social History   Socioeconomic History  . Marital status: Single    Spouse name: Not on file  . Number of children: Not on file  . Years of education: Not on file  . Highest education level: Not on file  Occupational History  . Not on file  Tobacco Use  . Smoking status: Current Every Day Smoker    Packs/day: 0.50    Years: 20.00    Pack years: 10.00    Types: Cigarettes    Start date: 11/06/1984  . Smokeless tobacco: Never Used  Vaping Use  . Vaping Use: Never used  Substance and Sexual Activity  . Alcohol use: No    Alcohol/week: 0.0 standard drinks  . Drug use: No  . Sexual activity: Not  Currently  Other Topics Concern  . Not on file  Social History Narrative  . Not on file   Social Determinants of Health   Financial Resource Strain: Not on file  Food Insecurity: Not on file  Transportation Needs: Not on file  Physical Activity: Not on file  Stress: Not on file  Social Connections: Not on file   Sleep: Poor overnight - oversedated this morning  Appetite:  Good per nursing  Current Medications: Current Facility-Administered Medications  Medication Dose Route Frequency Provider Last Rate Last Admin  . acetaminophen  (TYLENOL) tablet 650 mg  650 mg Oral Q6H PRN Clapacs, Jackquline DenmarkJohn T, MD   650 mg at 10/05/20 1238  . alum & mag hydroxide-simeth (MAALOX/MYLANTA) 200-200-20 MG/5ML suspension 30 mL  30 mL Oral Q4H PRN Clapacs, John T, MD      . aspirin EC tablet 81 mg  81 mg Oral Daily Clapacs, Jackquline DenmarkJohn T, MD   81 mg at 10/06/20 0824  . benztropine (COGENTIN) tablet 0.5 mg  0.5 mg Oral BID Bartholomew CrewsSingleton, Huy Majid E, MD   0.5 mg at 10/06/20 1816  . bisacodyl (DULCOLAX) EC tablet 5 mg  5 mg Oral QHS Antonieta Pertlary, Greg Lawson, MD   5 mg at 10/06/20 2133  . buPROPion (WELLBUTRIN XL) 24 hr tablet 300 mg  300 mg Oral Daily Antonieta Pertlary, Greg Lawson, MD   300 mg at 10/06/20 [id number]  . divalproex (DEPAKOTE ER) 24 hr tablet 1,000 mg  1,000 mg Oral QHS Antonieta Pertlary, Greg Lawson, MD   1,000 mg at 10/06/20 2133  . fenofibrate tablet 160 mg  160 mg Oral Daily Clapacs, Jackquline DenmarkJohn T, MD   160 mg at 10/06/20 0825  . Gerhardt's butt cream   Topical BID PRN White, Patrice L, NP   1 application at 10/06/20 0534  . hydrocortisone (ANUSOL-HC) 2.5 % rectal cream   Rectal TID Antonieta Pertlary, Greg Lawson, MD   Given at 10/06/20 1317  . levothyroxine (SYNTHROID) tablet 175 mcg  175 mcg Oral Q0600 Comer LocketSingleton, Kevaughn Ewing E, MD   175 mcg at 10/06/20 0533  . loratadine (CLARITIN) tablet 10 mg  10 mg Oral Daily Clapacs, Jackquline DenmarkJohn T, MD   10 mg at 10/06/20 0824  . magnesium hydroxide (MILK OF MAGNESIA) suspension 30 mL  30 mL Oral Daily PRN Clapacs, John T, MD      . metFORMIN (GLUCOPHAGE) tablet 500 mg  500 mg Oral Q breakfast Clapacs, Jackquline DenmarkJohn T, MD   500 mg at 10/06/20 0829  . nicotine (NICODERM CQ - dosed in mg/24 hours) patch 14 mg  14 mg Transdermal Daily Antonieta Pertlary, Greg Lawson, MD   14 mg at 10/06/20 0827  . nystatin (MYCOSTATIN/NYSTOP) topical powder   Topical TID Laveda AbbeParks, Laurie Britton, NP   Given at 10/06/20 0827  . polyethylene glycol (MIRALAX / GLYCOLAX) packet 17 g  17 g Oral Daily Bartholomew CrewsSingleton, Matisha Termine E, MD   17 g at 10/06/20 0827  . risperiDONE (RISPERDAL) tablet 4 mg  4 mg Oral QHS Comer LocketSingleton, Evangelyne Loja E, MD   4 mg at  10/06/20 2132  . Rivaroxaban (XARELTO) tablet 15 mg  15 mg Oral BID WC Antonieta Pertlary, Greg Lawson, MD   15 mg at 10/06/20 1816   Followed by  . [START ON 10/26/2020] rivaroxaban (XARELTO) tablet 20 mg  20 mg Oral Q supper Antonieta Pertlary, Greg Lawson, MD      . rosuvastatin (CRESTOR) tablet 40 mg  40 mg Oral Daily Comer LocketSingleton, Tashae Inda E, MD   40 mg at 10/06/20 [id number]  .  traZODone (DESYREL) tablet 150 mg  150 mg Oral QHS PRN Antonieta Pert, MD   150 mg at 10/06/20 2133    Lab Results:  Results for orders placed or performed during the hospital encounter of 09/19/20 (from the past 48 hour(s))  Glucose, capillary     Status: None   Collection Time: 10/05/20 11:48 AM  Result Value Ref Range   Glucose-Capillary 90 70 - 99 mg/dL    Comment: Glucose reference range applies only to samples taken after fasting for at least 8 hours.  Glucose, capillary     Status: None   Collection Time: 10/05/20  4:59 PM  Result Value Ref Range   Glucose-Capillary 99 70 - 99 mg/dL    Comment: Glucose reference range applies only to samples taken after fasting for at least 8 hours.  Glucose, capillary     Status: None   Collection Time: 10/05/20  5:51 PM  Result Value Ref Range   Glucose-Capillary 99 70 - 99 mg/dL    Comment: Glucose reference range applies only to samples taken after fasting for at least 8 hours.  Glucose, capillary     Status: None   Collection Time: 10/06/20  5:36 AM  Result Value Ref Range   Glucose-Capillary 96 70 - 99 mg/dL    Comment: Glucose reference range applies only to samples taken after fasting for at least 8 hours.  Brain natriuretic peptide     Status: None   Collection Time: 10/06/20  7:21 AM  Result Value Ref Range   B Natriuretic Peptide 39.7 0.0 - 100.0 pg/mL    Comment: Performed at [facility], [address] [place], [place] [address]  Protime-INR     Status: None   Collection Time: 10/06/20  7:21 AM  Result Value Ref Range   Prothrombin Time 14.8 11.4 - 15.2 seconds    INR 1.2 0.8 - 1.2    Comment: (NOTE) INR goal varies based on device and disease states. Performed at [facility], [address], [place], [place] [address]   CBC with Differential/Platelet     Status: None   Collection Time: 10/06/20  7:21 AM  Result Value Ref Range   WBC 7.0 4.0 - 10.5 K/uL   RBC 4.40 3.87 - 5.11 MIL/uL   Hemoglobin 13.7 12.0 - 15.0 g/dL   HCT 88.4 16.6 - 06.3 %   MCV 95.9 80.0 - 100.0 fL   MCH 31.1 26.0 - 34.0 pg   MCHC 32.5 30.0 - 36.0 g/dL   RDW 01.6 01.0 - 93.2 %   Platelets 167 150 - 400 K/uL   nRBC 0.0 0.0 - 0.2 %   Neutrophils Relative % 61 %   Neutro Abs 4.2 1.7 - 7.7 K/uL   Lymphocytes Relative 27 %   Lymphs Abs 1.9 0.7 - 4.0 K/uL   Monocytes Relative 10 %   Monocytes Absolute 0.7 0.1 - 1.0 K/uL   Eosinophils Relative 2 %   Eosinophils Absolute 0.1 0.0 - 0.5 K/uL   Basophils Relative 0 %   Basophils Absolute 0.0 0.0 - 0.1 K/uL   Immature Granulocytes 0 %   Abs Immature Granulocytes 0.03 0.00 - 0.07 K/uL    Comment: Performed at [facility], [address] [place], [place] [address]  Glucose, capillary     Status: Abnormal   Collection Time: 10/07/20  6:30 AM  Result Value Ref Range   Glucose-Capillary 110 (H) 70 - 99 mg/dL  Comment: Glucose reference range applies only to samples taken after fasting for at least 8 hours.    Blood Alcohol level:  Lab Results  Component Value Date   ETH <10 09/16/2020   ETH <10 12/03/2019    Metabolic Disorder Labs: Lab Results  Component Value Date   HGBA1C 6.5 (H) 09/16/2020   MPG 139.85 09/16/2020   MPG 128.37 12/28/2018   Lab Results  Component Value Date   PROLACTIN 15.1 07/03/2015   Lab Results  Component Value Date   CHOL 106 09/16/2020   TRIG 315 (H) 09/16/2020   HDL 28 (L) 09/16/2020   CHOLHDL 3.8 09/16/2020   VLDL 63 (H) 09/16/2020   LDLCALC 15 09/16/2020   LDLCALC 38 12/28/2018    Physical Findings: AIMS: Facial and Oral  Movements Muscles of Facial Expression: None, normal Lips and Perioral Area: None, normal Jaw: None, normal Tongue: None, normal,Extremity Movements Upper (arms, wrists, hands, fingers): None, normal Lower (legs, knees, ankles, toes): None, normal, Trunk Movements Neck, shoulders, hips: None, normal, Overall Severity Severity of abnormal movements (highest score from questions above): None, normal Incapacitation due to abnormal movements: None, normal Patient's awareness of abnormal movements (rate only patient's report): No Awareness, Dental Status Current problems with teeth and/or dentures?: No Does patient usually wear dentures?: No      Musculoskeletal: Strength & Muscle Tone: untested in bed today and uncooperative for testing Gait & Station: untested in bed today Patient leans: N/A  Psychiatric Specialty Exam: Physical Exam Vitals reviewed.  HENT:     Head: Normocephalic.  Pulmonary:     Effort: Pulmonary effort is normal.     ROS - would not cooperate for questioning  Vitals:   10/06/20 0700 10/06/20 0758  BP: (!) 107/52 (!) 124/54  Pulse: 68 (!) 54  Resp:    Temp: 98.1 F (36.7 C)   SpO2:      General Appearance: obese, in hospital gown - disheveled appearing  Eye Contact:  Poor  Speech:  Mumbling quality   Volume:  soft  Mood:  Sedated, ambivalent   Affect:  sedated  Thought Process:  Concrete  Orientation:  Would not cooperate for questioning  Thought Content:  Denies AVH but would not cooperate for further questioning - does not appear to be responding to internal/external stimuli on exam  Suicidal Thoughts:  No  Homicidal Thoughts:  No  Memory: Poor  Judgement:  Limited  Insight:  Lacking  Psychomotor Activity:  Decreased   Concentration:  Poor  Recall:  Poor  Fund of Knowledge:  Limited   Language:  Fair  Akathisia:  Negative  Assets:  Desire for Improvement Resilience  ADL's:  Needs prompts and assistance  Sleep:  5.35 hours   Treatment  Plan Summary: Diagnoses / Active Problems: Bipolar d/o MRE depressed Major Neurocognitive impairment - likely multifactorial Type II DM Hypothyroidism Hypertriglyceridemia Tobacco Use d/o Hemorrhoids   PLAN: 1. Safety and Monitoring:  -- Voluntary admission to inpatient psychiatric unit for safety, stabilization and treatment  -- Daily contact with patient to assess and evaluate symptoms and progress in treatment  -- Patient's case to be discussed in multi-disciplinary team meeting  -- Observation Level : q15 minute checks  -- Vital signs:  q12 hours  -- Precautions: suicide  2. Psychiatric Diagnoses and Treatment:   Bipolar d/o MRE depressed  Major cognitive impairment - likely multifactorial  -- Patient on 1:1 for confusion and fall risk with APS report made and guardianship pending - attempting to  get placed in SNF  -- Continue Cogentin 0.5mg  bid for side effects of medication -- Due to concerns for daytime fall risk and desire to be in bed during the day, will attempt to reduce Risperdal to 2mg  po qhs and monitor for signs of psychosis with dose reduction  -- After speaking to IM, will Reduce VPA back to 750mg  ER every evening for mood stability in the event that recent dose increase has contributed to increased sedation (10/02/20: AST 15, ALT 14, WBC 6.6, H/H 13.2/40.1, platelets 163; VPA level 34) will recheck VPA level, CBC, and LFTs for monitoring tomorrow -  -- Continue Trazodone 150mg  po qhs PRN for insomnia while on 1:1 monitoring   -- Continue Wellbutrin XL 300mg  daily for depression   -- Metabolic profile and EKG monitoring obtained while on an atypical antipsychotic (BMI:44.02;  Lipid Panel: cholesterol 106, triglycerides 315, HDL 28, LDL 15; HbgA1c: 6.5; QTc:473ms)   -- Encouraged patient to participate in unit milieu and in scheduled group therapies   -- Short Term Goals: Ability to identify changes in lifestyle to reduce recurrence of condition will improve and Ability  to identify and develop effective coping behaviors will improve  -- Long Term Goals: Improvement in symptoms so as ready for discharge   3. Medical Issues Being Addressed:   Medical Consult placed to assist with medical issues and deconditioning   Fall Risk/physical deconditioning  -- continue 1:1 with walker/gait belt and wheelchair     Constipation  -- Continue Dulcolax 5mg  po qhs and Miralax 17g daily  -- Encourage ambulation and po hydration   Hemorrhoids  -- Continue Anusol- HC cream tid   UTI with urinary incontinence  -- completed 7 day course of Macrobid    Hypertriglyceridemia  -- Continue Fenofibrate 160mg  daily  -- Continue Crestor 40mg  daily    Type II DM  -- Discontinued Glipizide given recent blood sugar trends  -- Continue Glucophage 500mg  daily  -- Checking FSBS tid (recent blood sugars 110, 96, 99)   Hypothyroidism  -- Continue Synthroid 10-[birth year]-1983 daily  -- TSH 1.116   Seasonal Allergies  -- Continue Claritin 10mg  daily   Tobacco Use d/o  -- Smoking cessation encouraged  -- Continue Nicoderm patch 14mg /24 hours   Age Indeterminate septal infarct on EKG  -- Patient will remain on ASA 81mg  daily  -- Patient currently asymptomatic - spoke with Dr. , on call for cardiology, who feels was likely lead placement and he recommends f/u with PCP as an outpatient - no further inpatient w/u needed at this time  -- Holding restart of Cozaar since patient has not been compliant with med prior to admission and monitoring blood pressures   Skin breakdown  -- Continue Gerhardt's butt cream for areas of skin breakdown and encourage positional changes/OOB qid    DVT prevention given immobility and h/o emboli  -- Continue Xarelto 15mg  bid with food for 41 doses then increase to 20mg  po bid   -- Compression hose ordered and patient written to be out of bed and walk qid  -- CBC 10/06/20: H/H 13.7/42.2 platelets 167; PT 14.8/INR 1.2   High Risk Medication Use: -- The  complexity of this patient's case involves drug therapy with Depakote which requires intensive monitoring for toxicity. This is in part due to a narrow therapeutic window as well as potential for toxicity with this medication.   4. Discharge Planning:   -- Social work and case management to assist with discharge planning and identification  of hospital follow-up needs prior to discharge - APS referral pending and asked patient to consider ALF referral  -- Estimated LOS: TBD  -- Discharge Concerns: Need to establish a safety plan; Medication compliance and effectiveness  -- Discharge Goals: Return home with outpatient referrals for mental health follow-up including medication management/psychotherapy  Comer Locket, MD, FAPA 10/07/2020, 7:52 AM

## 2020-10-08 DIAGNOSIS — E785 Hyperlipidemia, unspecified: Secondary | ICD-10-CM | POA: Diagnosis not present

## 2020-10-08 DIAGNOSIS — N390 Urinary tract infection, site not specified: Secondary | ICD-10-CM | POA: Diagnosis not present

## 2020-10-08 DIAGNOSIS — G9340 Encephalopathy, unspecified: Secondary | ICD-10-CM | POA: Diagnosis not present

## 2020-10-08 DIAGNOSIS — Z8261 Family history of arthritis: Secondary | ICD-10-CM | POA: Diagnosis not present

## 2020-10-08 DIAGNOSIS — R4781 Slurred speech: Secondary | ICD-10-CM | POA: Diagnosis present

## 2020-10-08 DIAGNOSIS — J3489 Other specified disorders of nose and nasal sinuses: Secondary | ICD-10-CM | POA: Diagnosis not present

## 2020-10-08 DIAGNOSIS — F17210 Nicotine dependence, cigarettes, uncomplicated: Secondary | ICD-10-CM | POA: Diagnosis present

## 2020-10-08 DIAGNOSIS — Z820 Family history of epilepsy and other diseases of the nervous system: Secondary | ICD-10-CM | POA: Diagnosis not present

## 2020-10-08 DIAGNOSIS — E119 Type 2 diabetes mellitus without complications: Secondary | ICD-10-CM | POA: Diagnosis not present

## 2020-10-08 DIAGNOSIS — I6389 Other cerebral infarction: Secondary | ICD-10-CM | POA: Diagnosis not present

## 2020-10-08 DIAGNOSIS — K219 Gastro-esophageal reflux disease without esophagitis: Secondary | ICD-10-CM | POA: Diagnosis not present

## 2020-10-08 DIAGNOSIS — Z20822 Contact with and (suspected) exposure to covid-19: Secondary | ICD-10-CM | POA: Diagnosis not present

## 2020-10-08 DIAGNOSIS — Z7984 Long term (current) use of oral hypoglycemic drugs: Secondary | ICD-10-CM | POA: Diagnosis not present

## 2020-10-08 DIAGNOSIS — R1310 Dysphagia, unspecified: Secondary | ICD-10-CM | POA: Diagnosis present

## 2020-10-08 DIAGNOSIS — R627 Adult failure to thrive: Secondary | ICD-10-CM | POA: Diagnosis present

## 2020-10-08 DIAGNOSIS — B965 Pseudomonas (aeruginosa) (mallei) (pseudomallei) as the cause of diseases classified elsewhere: Secondary | ICD-10-CM | POA: Diagnosis not present

## 2020-10-08 DIAGNOSIS — I700 Atherosclerosis of aorta: Secondary | ICD-10-CM | POA: Diagnosis not present

## 2020-10-08 DIAGNOSIS — R4182 Altered mental status, unspecified: Secondary | ICD-10-CM | POA: Diagnosis present

## 2020-10-08 DIAGNOSIS — G9389 Other specified disorders of brain: Secondary | ICD-10-CM | POA: Diagnosis not present

## 2020-10-08 DIAGNOSIS — G928 Other toxic encephalopathy: Secondary | ICD-10-CM | POA: Diagnosis not present

## 2020-10-08 DIAGNOSIS — Z79899 Other long term (current) drug therapy: Secondary | ICD-10-CM | POA: Diagnosis not present

## 2020-10-08 DIAGNOSIS — Z6841 Body Mass Index (BMI) 40.0 and over, adult: Secondary | ICD-10-CM | POA: Diagnosis not present

## 2020-10-08 DIAGNOSIS — R29818 Other symptoms and signs involving the nervous system: Secondary | ICD-10-CM | POA: Diagnosis not present

## 2020-10-08 DIAGNOSIS — N898 Other specified noninflammatory disorders of vagina: Secondary | ICD-10-CM | POA: Diagnosis not present

## 2020-10-08 DIAGNOSIS — E039 Hypothyroidism, unspecified: Secondary | ICD-10-CM | POA: Diagnosis not present

## 2020-10-08 DIAGNOSIS — Z7401 Bed confinement status: Secondary | ICD-10-CM | POA: Diagnosis not present

## 2020-10-08 DIAGNOSIS — F419 Anxiety disorder, unspecified: Secondary | ICD-10-CM | POA: Diagnosis present

## 2020-10-08 DIAGNOSIS — G319 Degenerative disease of nervous system, unspecified: Secondary | ICD-10-CM | POA: Diagnosis not present

## 2020-10-08 DIAGNOSIS — F3163 Bipolar disorder, current episode mixed, severe, without psychotic features: Secondary | ICD-10-CM | POA: Diagnosis not present

## 2020-10-08 DIAGNOSIS — Z7982 Long term (current) use of aspirin: Secondary | ICD-10-CM | POA: Diagnosis not present

## 2020-10-08 DIAGNOSIS — Z79890 Hormone replacement therapy (postmenopausal): Secondary | ICD-10-CM | POA: Diagnosis not present

## 2020-10-08 LAB — CBC
HCT: 39.2 % (ref 36.0–46.0)
Hemoglobin: 13 g/dL (ref 12.0–15.0)
MCH: 31.3 pg (ref 26.0–34.0)
MCHC: 33.2 g/dL (ref 30.0–36.0)
MCV: 94.5 fL (ref 80.0–100.0)
Platelets: 169 10*3/uL (ref 150–400)
RBC: 4.15 MIL/uL (ref 3.87–5.11)
RDW: 13.2 % (ref 11.5–15.5)
WBC: 5.9 10*3/uL (ref 4.0–10.5)
nRBC: 0 % (ref 0.0–0.2)

## 2020-10-08 LAB — BASIC METABOLIC PANEL
Anion gap: 7 (ref 5–15)
BUN: 11 mg/dL (ref 8–23)
CO2: 30 mmol/L (ref 22–32)
Calcium: 9.1 mg/dL (ref 8.9–10.3)
Chloride: 103 mmol/L (ref 98–111)
Creatinine, Ser: 0.79 mg/dL (ref 0.44–1.00)
GFR, Estimated: >60 mL/min (ref >60)
Glucose, Bld: 93 mg/dL (ref 70–99)
Potassium: 3.9 mmol/L (ref 3.5–5.1)
Sodium: 140 mmol/L (ref 135–145)

## 2020-10-08 MED ORDER — SENNOSIDES-DOCUSATE SODIUM 8.6-50 MG PO TABS
1.0000 | ORAL_TABLET | Freq: Every day | ORAL | Status: DC
  Administered 2020-10-08 – 2020-11-06 (×30): 1 via ORAL
  Filled 2020-10-08 (×30): qty 1

## 2020-10-08 NOTE — TOC Initial Note (Signed)
Transition of Care [facility]) - Initial/Assessment Note    Patient Details  Name: Julie Morrow MRN: [id number] Date of Birth: 16-Mar-1951  Transition [facility]) CM/SW Contact:    Ida Rogue, LCSW Phone Number: 10/08/2020, 3:05 PM  Clinical Narrative:   Patient seen in follow up to PT recommendation of SNF.  Julie Morrow was asleep when I approached, open her eyes briefly and closed them again.  Did not respond to my questions nor requests. Bed search initiated.  Will return later to confirm her wishes to go to short term rehab. TOC will continue to follow during the course of hospitalization.                 Expected Discharge Plan: Skilled Nursing Facility Barriers to Discharge: SNF Pending bed offer   Patient Goals and CMS Choice     Choice offered to / list presented to : Patient  Expected Discharge Plan and Services Expected Discharge Plan: Skilled Nursing Facility   Discharge Planning Services: CM Consult Post Acute Care Choice: Skilled Nursing Facility Living arrangements for the past 2 months: Single Family Home                                      Prior Living Arrangements/Services Living arrangements for the past 2 months: Single Family Home Lives with:: Self Patient language and need for interpreter reviewed:: Yes        Need for Family Participation in Patient Care: Yes (Comment) Care giver support system in place?: No (comment)   Criminal Activity/Legal Involvement Pertinent to Current Situation/Hospitalization: No - Comment as needed  Activities of Daily Living Home Assistive Devices/Equipment: None ADL Screening (condition at time of admission) Patient's cognitive ability adequate to safely complete daily activities?: Yes Is the patient deaf or have difficulty hearing?: No Does the patient have difficulty seeing, even when wearing glasses/contacts?: No Does the patient have difficulty concentrating, remembering, or making decisions?: No Patient  able to express need for assistance with ADLs?: Yes Does the patient have difficulty dressing or bathing?: No Independently performs ADLs?: No Communication: Independent Dressing (OT): Independent Grooming: Independent Feeding: Independent Bathing: Independent Toileting: Needs assistance Is this a change from baseline?: Pre-admission baseline In/Out Bed: Needs assistance Is this a change from baseline?: Pre-admission baseline Walks in Home: Needs assistance Is this a change from baseline?: Pre-admission baseline Does the patient have difficulty walking or climbing stairs?: Yes Weakness of Legs: Both Weakness of Arms/Hands: Both  Permission Sought/Granted Permission sought to share information with : Family Supports Permission granted to share information with : No  Share Information with NAME: Julie Morrow, friend, [phone]           Emotional Assessment Appearance:: Appears stated age Attitude/Demeanor/Rapport: Lethargic Affect (typically observed): Unable to Assess   Alcohol / Substance Use: Not Applicable Psych Involvement: Yes (comment) (psychotropic medications to address mental health symptoms)  Admission diagnosis:  Rales [R09.89] Failure to thrive in adult [R62.7] Acute encephalopathy [G93.40] FTT (failure to thrive) in adult [R62.7] Patient Active Problem List   Diagnosis Date Noted  . FTT (failure to thrive) in adult 10/08/2020  . Depression 10/07/2020  . Thyroid disease 10/07/2020  . Hyperlipidemia 10/07/2020  . Failure to thrive in adult 10/07/2020  . Acute encephalopathy 10/07/2020  . Major neurocognitive disorder (HCC) 09/27/2020  . Diabetes (HCC) 09/23/2020  . Adjustment disorder with anxiety 09/13/2020  .  Family discord 12/05/2019  . AMS (altered mental status) 05/14/2019  . Acute lower UTI 05/14/2019  . Adjustment disorder with mixed disturbance of emotions and conduct   . Bipolar I disorder with mania (HCC) 12/27/2018  . PAD (peripheral  artery disease) (HCC) 10/16/2018  . GAD (generalized anxiety disorder) 03/04/2018  . Insomnia 03/04/2018  . Situational depression 03/04/2018  . Bipolar depression (HCC) 03/04/2018  . Acute kidney injury (HCC) 05/30/2017  . Bradycardia   . Lithium toxicity   . COPD GOLD 0 / still smoking  12/22/2016  . Generalized weakness 10/27/2015  . GERD (gastroesophageal reflux disease) 10/15/2015  . Bipolar 1 disorder, mixed, severe (HCC) 10/11/2015  . Diabetes type 2, controlled (HCC) 07/07/2015  . Morbid (severe) obesity due to excess calories (HCC) 07/07/2015  . Cigarette smoker 07/07/2015  . Hypothyroidism 07/07/2015  . HLD (hyperlipidemia) 11/07/2014  . Psoriasis 11/07/2014   PCP:  Patient, No Pcp Per (Inactive) Pharmacy:   [id number] [place], [place] - [address] AT [facility] [address] [place] [address] Phone: [phone] Fax: [phone]     Social Determinants of Health (SDOH) Interventions    Readmission Risk Interventions No flowsheet data found.

## 2020-10-08 NOTE — Evaluation (Signed)
Clinical/Bedside Swallow Evaluation Patient Details  Name: Julie Morrow MRN: [id number] Date of Birth: 11/21/1950  Today's Date: 10/08/2020 Time: SLP Start Time (ACUTE ONLY): 1225 SLP Stop Time (ACUTE ONLY): 1250 SLP Time Calculation (min) (ACUTE ONLY): 25 min  Past Medical History:  Past Medical History:  Diagnosis Date  . Anxiety   . Bipolar 1 disorder (HCC)   . Bradycardia   . Depression   . Diabetes mellitus, type II (HCC)    Patient takes Glucotrol and Januvia  . Hypothyroidism   . Lithium toxicity    Past Surgical History:  Past Surgical History:  Procedure Laterality Date  . ABDOMINAL HYSTERECTOMY    . CHOLECYSTECTOMY     HPI:  70 year old female with history of diabetes, bipolar disorder comes in with chief complaint of change in mental status.  Patient was admitted to behavioral health hospital and was sent to hospital   Swallow evaluation ordered.  CT head and CXR negative.  .   Assessment / Plan / Recommendation Clinical Impression  Patient presents with clinical indications concerning for primary cognitive based dysphagia with component of weakness likely as pt is significantly dysarthric.  evidenced by decreased oral clearance of solids. Pt placed more food in her mouth despite total cues to clear. Consumption of liquids to help with oral clearance did not result in clearance of solids and resulted in overt cough.  Pt instructed to expectorate solids with coughing episode, clearing partially masticated lettuce boluses x3.  Use of straws resulted in pt coughing post-swallow - ? penetration of thin liquids with swallowing/inhalation. Drinking from cup mitigated cough to minimal with liquids.  And pt better able to tolerate pureed foods thus recommend to modify diet to compensate for oral deficits.  Will follow up for indication for MBS and/or dietary advancement. RN and NT aware of need for full supervision while encouraging pt to self feed.  Skilled intervention included  determining mitigation strategies. SLP Visit Diagnosis: Dysphagia, oral phase (R13.11)    Aspiration Risk  Moderate aspiration risk    Diet Recommendation Dysphagia 1 (Puree);Thin liquid   Liquid Administration via: Cup;No straw Medication Administration: Whole meds with puree Supervision: Patient able to self feed;Full supervision/cueing for compensatory strategies Compensations: Slow rate;Small sips/bites Postural Changes: Seated upright at 90 degrees;Remain upright for at least 30 minutes after po intake    Other  Recommendations Oral Care Recommendations: Oral care BID   Follow up Recommendations        Frequency and Duration min 1 x/week  1 week       Prognosis Prognosis for Safe Diet Advancement: Fair Barriers to Reach Goals: Other (Comment) (psychological diagnosis)      Swallow Study   General Date of Onset: 10/08/20 HPI: 70 year old female with history of diabetes, bipolar disorder comes in with chief complaint of change in mental status.  Patient was admitted to behavioral health hospital and was sent to hospital   Swallow evaluation ordered.  CT head and CXR negative.  . Type of Study: Bedside Swallow Evaluation Diet Prior to this Study: Regular;Thin liquids Temperature Spikes Noted: No Respiratory Status: Room air History of Recent Intubation: No Behavior/Cognition: Alert;Cooperative;Pleasant mood Oral Cavity Assessment: Dry Oral Care Completed by SLP: No Oral Cavity - Dentition: Adequate natural dentition Vision: Functional for self-feeding Self-Feeding Abilities: Able to feed self Patient Positioning: Upright in bed Baseline Vocal Quality: Low vocal intensity Volitional Cough: Weak Volitional Swallow: Unable to elicit    Oral/Motor/Sensory Function Overall Oral Motor/Sensory Function: Generalized oral weakness  Ice Chips Ice chips: Not tested   Thin Liquid Thin Liquid: Impaired Presentation: Cup;Self Fed;Straw Pharyngeal  Phase Impairments: Cough -  Immediate;Suspected delayed Swallow    Nectar Thick Nectar Thick Liquid: Not tested   Honey Thick Honey Thick Liquid: Not tested   Puree Puree: Impaired Presentation: Spoon Oral Phase Functional Implications: Left lateral sulci pocketing;Oral residue Pharyngeal Phase Impairments: Suspected delayed Swallow   Solid     Solid: Impaired Presentation: Self Fed Oral Phase Impairments: Reduced lingual movement/coordination;Poor awareness of bolus;Impaired mastication Oral Phase Functional Implications: Left lateral sulci pocketing;Impaired mastication;Oral residue      Julie Morrow 10/08/2020,1:45 PM  Julie Infante, MS [facility] SLP Acute Rehab Services Office [phone] Pager [phone]

## 2020-10-08 NOTE — Progress Notes (Signed)
PROGRESS NOTE    Julie DamesSusan B Rumble  ZOX:[id number] DOB: Oct 26, 1950 DOA: 10/07/2020 PCP: Patient, No Pcp Per (Inactive)    Chief Complaint  Patient presents with  . Failure To Thrive    Brief Narrative:  This is a69 y.o.femalewith a past medical history of bipolar 1 disorder, type 2 diabetes, hypothyroidism, hypertension, hyperlipidemia who [facility] with severe depression and suicidal ideation, She was evaluated by psychiatry and deemed appropriate for inpatient psychiatric treatment and [facility]  While hospitalized she had multiple unwitnessed fallsand seemed to have some confusion overnight between 5/11-5/12 and [facility]. Imaging and work-up at the time was unremarkable for fracture and noted to be alert and oriented and was discharged on 5/12 [facility]. On 5/13 she was evaluated by OT who deemed the patient not appropriate to return home and live independently due to safety risk, cognitive impairments and inability to safely engage in ADLs and was recommended to discharge to SNF. She was started on Macrobid for a UTI on 5/15. She has had not had much improvement since being treated for UTI. Of note, she was placed on Xarelto several days ago for DVT prophylaxis.  the patient was relatively independent in terms of ADLs prior to [facility] but [facility] she has had a rapid and progressive decline in functionality despite decreasing her risperidone. She also notes that her Depakote has been increased over the past week in order to return to her PTA dosage. Dr. Mason JimSingleton also reported sacral pressure ulcers. On her most recent OT assessment yesterday, she required maximum cues and was noted that she had slurred speech, and impaired insight. Physical therapy has also noted that she now requires mod 2/max assist.  [facility] due to failure to thrive/confusion  Subjective:  Talking on the phone ,ordering lunch Reports feeling better,  She remains very weak, required max assist per CNA Speech is slurred and delayed but no agitation, following commands She allowed me to call her sister   Assessment & Plan:   Principal Problem:   Failure to thrive in adult Active Problems:   Diabetes type 2, controlled (HCC)   Hypothyroidism   Bipolar 1 disorder, mixed, severe (HCC)   Hyperlipidemia   Acute encephalopathy    FTT/rapid decline in functional status/acute encephalopathy of unclear etiology: CT of the head no acute findings She has no leukocytosis, no fever She has mild ammonia elevation at 43, valproic level at 38 Appear improving as respiratory and Depakote dose coming down Generalized weakness, PT recommends skilled nursing facility placement Dysphagia, speech recommended dysphagia diet/aspiration precaution  Bipolar disorder with severe depression and suicidal ideation Management per psychiatry  Noninsulin-dependent type 2 diabetes A1c 6.5 A.m. fasting blood glucose 93 Currently not on any meds, home medication GLP-1 agonist held Continue carb modified diet  Hyperlipidemia continue Crestor  Hypothyroidism Continue Synthroid supplement TSH 0.4  Class III obesity The patient's BMI is: Body mass index is 44.15 kg/m.Marland Kitchen.     Unresulted Labs (From admission, onward)          Start     Ordered   10/08/20 0500  Basic metabolic panel  Daily,   R      10/07/20 1511   10/08/20 0500  CBC  Daily,   R      10/07/20 1511  Signed and Held  Urinalysis, Routine w reflex microscopic  Once,   R        Signed and Held            DVT prophylaxis: enoxaparin (LOVENOX) injection 40 mg Start: 10/07/20 1800   Code Status: Full Family Communication: Patient Disposition:   Status is: Inpatient   Dispo: The patient is from: Behavioral health              Anticipated d/c is  to: Need skilled nursing facility placement once cleared by psychiatry              Anticipated d/c date is: To be determined                Consultants:   Psychiatry  Procedures:   None  Antimicrobials:    Anti-infectives (From admission, onward)   None         Objective: Vitals:   10/07/20 1637 10/07/20 2028 10/08/20 0049 10/08/20 0444  BP: (!) 118/58 (!) 148/72 (!) 146/56 (!) 142/68  Pulse: (!) 54 (!) 55 (!) 51 (!) 53  Resp: 18 20 18 18   Temp: (!) 97.5 F (36.4 C) (!) 97.5 F (36.4 C) (!) 97.5 F (36.4 C) 98.2 F (36.8 C)  TempSrc: Oral     SpO2: 97% 98% 94% 97%  Weight:      Height:        Intake/Output Summary (Last 24 hours) at 10/08/2020 1121 Last data filed at 10/07/2020 1808 Gross per 24 hour  Intake 1000 ml  Output 1100 ml  Net -100 ml   Filed Weights   10/07/20 1310  Weight: 106 kg    Examination:  General exam: calm, NAD, speech is slurred and delayed, not oriented to the month,  Respiratory system: Clear to auscultation. Respiratory effort normal. Cardiovascular system: S1 & S2 heard, RRR. No JVD, no murmur, No pedal edema. Gastrointestinal system: Abdomen is nondistended, soft and nontender. Normal bowel sounds heard. Central nervous system: Alert and oriented. No focal neurological deficits. Extremities: generalized weakness, barely able to lift both legs against gravity  Skin: No rashes, lesions or ulcers Psychiatry: flat affect , no agitation, speech is slurred and delayed, following commands     Data Reviewed: I have personally reviewed following labs and imaging studies  CBC: Recent Labs  Lab 10/02/20 0630 10/06/20 0721 10/07/20 1329 10/08/20 0526  WBC 6.6 7.0 7.4 5.9  NEUTROABS 3.7 4.2 4.5  --   HGB 13.2 13.7 14.4 13.0  HCT 40.1 42.2 44.8 39.2  MCV 94.4 95.9 96.3 94.5  PLT 163 167 205 169    Basic Metabolic Panel: Recent Labs  Lab 10/07/20 1329 10/08/20 0526  NA 138 140  K 4.7 3.9  CL 98 103  CO2 30 30   GLUCOSE 121* 93  BUN 15 11  CREATININE 1.01* 0.79  CALCIUM 9.7 9.1    GFR: Estimated Creatinine Clearance: 74.5 mL/min (by C-G formula based on SCr of 0.79 mg/dL).  Liver Function Tests: Recent Labs  Lab 10/02/20 0630 10/07/20 1329  AST 15 19  ALT 14 14  ALKPHOS 43 52  BILITOT 0.5 0.4  PROT 6.0* 7.3  ALBUMIN 3.5 3.9    CBG: Recent Labs  Lab 10/05/20 1148 10/05/20 1659 10/05/20 1751 10/06/20 0536 10/07/20 0630  GLUCAP 90 99 99 96 110*     Recent Results (from the past 240 hour(s))  Urine Culture     Status: Abnormal   Collection Time: 09/29/20  10:01 AM   Specimen: Urine, Clean Catch  Result Value Ref Range Status   Specimen Description   Final    URINE, CLEAN CATCH Performed at [facility], [address] [place], [place] [address]    Special Requests   Final    NONE Performed at [facility], [address] [place], [place] [address]    Culture MULTIPLE SPECIES PRESENT, SUGGEST RECOLLECTION (A)  Final   Report Status 10/01/2020 FINAL  Final  Resp Panel by RT-PCR (Flu A&B, Covid) Nasopharyngeal Swab     Status: None   Collection Time: 10/07/20  2:36 PM   Specimen: Nasopharyngeal Swab; Nasopharyngeal(NP) swabs in vial transport medium  Result Value Ref Range Status   SARS Coronavirus 2 by RT PCR NEGATIVE NEGATIVE Final    Comment: (NOTE) SARS-CoV-2 target nucleic acids are NOT DETECTED.  The SARS-CoV-2 RNA is generally detectable in upper respiratory specimens during the acute phase of infection. The lowest concentration of SARS-CoV-2 viral copies this assay can detect is 138 copies/mL. A negative result does not preclude SARS-Cov-2 infection and should not be used as the sole basis for treatment or other patient management decisions. A negative result may occur with  improper specimen collection/handling, submission of specimen other than nasopharyngeal swab, presence of viral mutation(s) within the areas  targeted by this assay, and inadequate number of viral copies(<138 copies/mL). A negative result must be combined with clinical observations, patient history, and epidemiological information. The expected result is Negative.  Fact Sheet for Patients:  BloggerCourse.com  Fact Sheet for Healthcare Providers:  SeriousBroker.it  This test is no t yet approved or cleared by the [place] FDA and  has been authorized for detection and/or diagnosis of SARS-CoV-2 by FDA under an Emergency Use Authorization (EUA). This EUA will remain  in effect (meaning this test can be used) for the duration of the COVID-19 declaration under Section 564(b)(1) of the Act, 21 U.S.C.section 360bbb-3(b)(1), unless the authorization is terminated  or revoked sooner.       Influenza A by PCR NEGATIVE NEGATIVE Final   Influenza B by PCR NEGATIVE NEGATIVE Final    Comment: (NOTE) The Xpert Xpress SARS-CoV-2/FLU/RSV plus assay is intended as an aid in the diagnosis of influenza from Nasopharyngeal swab specimens and should not be used as a sole basis for treatment. Nasal washings and aspirates are unacceptable for Xpert Xpress SARS-CoV-2/FLU/RSV testing.  Fact Sheet for Patients: BloggerCourse.com  Fact Sheet for Healthcare Providers: SeriousBroker.it  This test is not yet approved or cleared by the [place] FDA and has been authorized for detection and/or diagnosis of SARS-CoV-2 by FDA under an Emergency Use Authorization (EUA). This EUA will remain in effect (meaning this test can be used) for the duration of the COVID-19 declaration under Section 564(b)(1) of the Act, 21 U.S.C. section 360bbb-3(b)(1), unless the authorization is terminated or revoked.  Performed at [facility], [address], [place], [place] [address]          Radiology Studies: CT HEAD WO  CONTRAST  Result Date: 10/07/2020 CLINICAL DATA:  Altered mental status. EXAM: CT HEAD WITHOUT CONTRAST TECHNIQUE: Contiguous axial images were obtained from the base of the skull through the vertex without intravenous contrast. COMPARISON:  Sep 25, 2020. FINDINGS: Brain: Stable postoperative encephalomalacia involving the right cerebellar hemisphere. No mass effect or midline shift is noted. Ventricular size is within normal limits. There is no evidence of mass lesion, hemorrhage or acute infarction. Vascular:  No hyperdense vessel or unexpected calcification. Skull: Status post right occipital craniotomy. No acute abnormality is noted. Sinuses/Orbits: No acute finding. Other: None. IMPRESSION: No acute intracranial abnormality seen. Electronically Signed   By: Lupita Raider M.D.   On: 10/07/2020 16:13   DG CHEST PORT 1 VIEW  Result Date: 10/07/2020 CLINICAL DATA:  Failure to thrive. EXAM: PORTABLE CHEST 1 VIEW COMPARISON:  Chest x-ray 05/14/2019. FINDINGS: Mediastinum and hilar structures normal. Heart size normal. No focal infiltrate. No pleural effusion or pneumothorax. No acute bony abnormality. IMPRESSION: No acute cardiopulmonary disease. Electronically Signed   By: Maisie Fus  Register   On: 10/07/2020 15:02        Scheduled Meds: . aspirin  81 mg Oral Daily  . benztropine  0.5 mg Oral BID  . buPROPion  300 mg Oral Daily  . divalproex  750 mg Oral QHS  . enoxaparin (LOVENOX) injection  40 mg Subcutaneous Q24H  . levothyroxine  150 mcg Oral Daily  . risperiDONE  2 mg Oral QHS  . rosuvastatin  40 mg Oral Daily  . sodium chloride flush  3 mL Intravenous Q12H   Continuous Infusions:   LOS: 0 days   Time spent: 25 mins Greater than 50% of this time was spent in counseling, explanation of diagnosis, planning of further management, and coordination of care.   Voice Recognition Reubin Milan dictation system was used to create this note, attempts have been made to correct errors. Please  contact the author with questions and/or clarifications.   Albertine Grates, MD PhD FACP [facility]  Available via Epic secure chat 7am-7pm for nonurgent issues Please page for urgent issues To page the attending provider between 7A-7P or the covering provider during after hours 7P-7A, please log into the web site [email or web address] and access using universal [place] password for that web site. If you do not have the password, please call the hospital operator.    10/08/2020, 11:21 AM

## 2020-10-08 NOTE — Consult Note (Signed)
  Pt is a 70 y/o female with PMHx of bipolar disorder, anxiety disorder, depression who presented to the [facility] ED 09/16/2020 and then admitted to [facility] on 09/20/2020. PT ordered/consulted to address and assess recent decline in functional mobility. Patient was modified independnet on 09/22/20 PT eval.  Then while patient was admitted to behavioral health Hospital, pt was assessed by hospitalist team for chief complaint of change in mental status, who advised that patient come to the hospital and be admitted for observation.  Psych consult placed for assistance with medications and management of bipolar disorder. Patient is seen and assessed this morning. SHe is very alert and orient, pleasant, calm and cooperative. She is observed sitting upright in bed, eating her breakfast. SHe was able to appropriately identify her foods on her tray, when asked. She was also able to engage in conversation with this Clinical research associate, and have linear conversation. She is agreeable with recent medication adjustments that were made yesterday. At this time she is on Trazodone 150mg  po qhs prn sleep, Risperdal 2mg  po qhs for schizophrnia, Depakote ER 750 po QHS for bipolar disorder, Wellbutrin XL 300mg  po daily, and cogentin 0.5mg  po BID. She denies suicidal ideations, homicidal ideations and or hallucinations. SHe denies any symptoms of depression at this time. SHe does not appear to be exhibiting any psychosis, delusional thinking, and or responding to internal stimuli.   When reading chart review she appears to be somewhat improved, in terms of cognitive decline. It is unclear if this was due to reduction in her Risperdal and Depakote or optimization of medical management (fluids, lactulose).   -Psych to continue to follow. No medication changes to be at this time as she appears to be improving.

## 2020-10-08 NOTE — Evaluation (Signed)
Physical Therapy Evaluation Patient Details Name: Julie Morrow MRN: [id number] DOB: 07/23/1950 Today's Date: 10/08/2020   History of Present Illness  Pt is a 70 y/o female with PMHx of bipolar disorder, anxiety disorder, depression who presented to the [facility] ED 09/16/2020 and then admitted to [facility] on 09/20/2020. PT ordered/consulted to address and assess recent decline in functional mobility. Patient was modified independnet on 09/22/20 PT eval.  Then while patient was admitted to behavioral health Hospital, pt was assessed by hospitalist team for chief complaint of change in mental status, who advised that patient come to the hospital and be admitted for observation.  Clinical Impression  Pt admitted with above diagnosis. Pt currently with functional limitations due to the deficits listed below (see PT Problem List). Pt will benefit from skilled PT to increase their independence and safety with mobility to allow discharge to the venue listed below.   Pt awake and finishing breakfast tray on arrival to room.  Pt orientated to self and [employer] and year 2022 however unable to recall month or date.  Pt reports previous to being here, she was in [place].  Pt appears to live in [place] however admitted to [place] [facility] from [facility].  Pt requiring multimodal cues and unable to stand with only one person assist.  Pt apparently modified independent on PT eval on 09/22/20 however currently requiring significant assist.  Mobility and cognition not at baseline, and pt will likely require SNF upon d/c.      Follow Up Recommendations SNF    Equipment Recommendations  None recommended by PT    Recommendations for Other Services       Precautions / Restrictions Precautions Precautions: Fall Restrictions Weight Bearing Restrictions: No      Mobility  Bed Mobility Overal bed mobility: Needs Assistance Bed Mobility: Supine to Sit;Sit to Supine     Supine to sit: Mod assist Sit to supine: Max assist   General  bed mobility comments: pt initiating after cues provided, required a hand to pull her trunk upright, assist for scooting to EOB utilizing bed pad for positioning; assist for lower body upon return to bed and required trendelenberg position to reposition in bed with cues    Transfers Overall transfer level: Needs assistance Equipment used: Rolling walker (2 wheeled) Transfers: Sit to/from Stand Sit to Stand: Total assist         General transfer comment: pt unable to stand with only one person assist, attempted x3  Ambulation/Gait                Stairs            Wheelchair Mobility    Modified Rankin (Stroke Patients Only)       Balance   Sitting-balance support: No upper extremity supported;Feet supported Sitting balance-Leahy Scale: Fair                                       Pertinent Vitals/Pain Pain Assessment: Faces Faces Pain Scale: Hurts even more Pain Location: knees with attempt to stand Pain Descriptors / Indicators: Sore Pain Intervention(s): Repositioned;Monitored during session    Home Living Family/patient expects to be discharged to:: Private residence Living Arrangements: Alone Available Help at Discharge: Family (pt reports they stop by at least 1x per week to bring grocery and medicines and to check on patient)   Home Access: Level entry     Home  Layout: One level Home Equipment: Walker - 2 wheels      Prior Function Level of Independence: Independent         Comments: pt reports that she doesn't use a RW in the home but has one in case she needs it. Reports she fixes her meals and completes all ADLS herself. She is not drving at this time - from previous evaluation [facility]     Hand Dominance        Extremity/Trunk Assessment        Lower Extremity Assessment Lower Extremity Assessment: Generalized weakness    Cervical / Trunk Assessment Cervical / Trunk Assessment: Normal  Communication    Communication: No difficulties  Cognition Arousal/Alertness: Awake/alert Behavior During Therapy: WFL for tasks assessed/performed Overall Cognitive Status: Impaired/Different from baseline Area of Impairment: Attention;Memory;Following commands;Safety/judgement;Awareness;Problem solving;Orientation                 Orientation Level: Disoriented to;Time   Memory: Decreased short-term memory Following Commands: Follows multi-step commands inconsistently;Follows one step commands with increased time Safety/Judgement: Decreased awareness of safety;Decreased awareness of deficits   Problem Solving: Slow processing;Difficulty sequencing;Requires verbal cues General Comments: multimodal cues for activities, extra time to process, pt initiates only after cueing      General Comments      Exercises     Assessment/Plan    PT Assessment Patient needs continued PT services  PT Problem List Decreased strength;Decreased mobility;Decreased safety awareness;Decreased knowledge of precautions;Decreased activity tolerance;Decreased cognition;Decreased balance;Decreased knowledge of use of DME;Decreased coordination;Obesity       PT Treatment Interventions DME instruction;Gait training;Functional mobility training;Therapeutic activities;Therapeutic exercise;Patient/family education;Cognitive remediation;Balance training;Wheelchair mobility training    PT Goals (Current goals can be found in the Care Plan section)  Acute Rehab PT Goals PT Goal Formulation: With patient Time For Goal Achievement: 10/22/20 Potential to Achieve Goals: Fair    Frequency Min 2X/week   Barriers to discharge        Co-evaluation               AM-PAC PT "6 Clicks" Mobility  Outcome Measure Help needed turning from your back to your side while in a flat bed without using bedrails?: A Lot Help needed moving from lying on your back to sitting on the side of a flat bed without using bedrails?: A  Lot Help needed moving to and from a bed to a chair (including a wheelchair)?: Total Help needed standing up from a chair using your arms (e.g., wheelchair or bedside chair)?: Total Help needed to walk in hospital room?: Total Help needed climbing 3-5 steps with a railing? : Total 6 Click Score: 8    End of Session Equipment Utilized During Treatment: Gait belt Activity Tolerance: Patient tolerated treatment well Patient left: in bed;with call bell/phone within reach;with bed alarm set Nurse Communication: Mobility status;Need for lift equipment (recommend stedy for OOB) PT Visit Diagnosis: Difficulty in walking, not elsewhere classified (R26.2);Muscle weakness (generalized) (M62.81)    Time: 1941-7408 PT Time Calculation (min) (ACUTE ONLY): 20 min   Charges:   PT Evaluation $PT Eval Low Complexity: 1 Low        Kati PT, DPT Acute Rehabilitation Services Pager: [phone] Office: [phone]  Maida Sale E 10/08/2020, 11:39 AM

## 2020-10-09 DIAGNOSIS — R627 Adult failure to thrive: Secondary | ICD-10-CM | POA: Diagnosis not present

## 2020-10-09 LAB — BASIC METABOLIC PANEL
Anion gap: 7 (ref 5–15)
BUN: 13 mg/dL (ref 8–23)
CO2: 30 mmol/L (ref 22–32)
Calcium: 9.5 mg/dL (ref 8.9–10.3)
Chloride: 101 mmol/L (ref 98–111)
Creatinine, Ser: 0.78 mg/dL (ref 0.44–1.00)
GFR, Estimated: >60 mL/min (ref >60)
Glucose, Bld: 99 mg/dL (ref 70–99)
Potassium: 3.8 mmol/L (ref 3.5–5.1)
Sodium: 138 mmol/L (ref 135–145)

## 2020-10-09 LAB — CBC
HCT: 42.9 % (ref 36.0–46.0)
Hemoglobin: 13.9 g/dL (ref 12.0–15.0)
MCH: 31 pg (ref 26.0–34.0)
MCHC: 32.4 g/dL (ref 30.0–36.0)
MCV: 95.8 fL (ref 80.0–100.0)
Platelets: 176 10*3/uL (ref 150–400)
RBC: 4.48 MIL/uL (ref 3.87–5.11)
RDW: 13.4 % (ref 11.5–15.5)
WBC: 7.7 10*3/uL (ref 4.0–10.5)
nRBC: 0 % (ref 0.0–0.2)

## 2020-10-09 MED ORDER — ADULT MULTIVITAMIN LIQUID CH
15.0000 mL | Freq: Every day | ORAL | Status: DC
  Administered 2020-10-09 – 2020-11-06 (×28): 15 mL via ORAL
  Filled 2020-10-09 (×29): qty 15

## 2020-10-09 MED ORDER — ENSURE MAX PROTEIN PO LIQD
11.0000 [oz_av] | Freq: Two times a day (BID) | ORAL | Status: DC
  Administered 2020-10-09 – 2020-11-06 (×42): 11 [oz_av] via ORAL
  Filled 2020-10-09 (×57): qty 330

## 2020-10-09 NOTE — Progress Notes (Addendum)
PROGRESS NOTE    Julie DamesSusan B Morrow  UJW:[id number] DOB: January 30, 1951 DOA: 10/07/2020 PCP: Patient, No Pcp Per (Inactive)    Chief Complaint  Patient presents with  . Failure To Thrive    Brief Narrative:  This is a69 y.o.femalewith a past medical history of bipolar 1 disorder, type 2 diabetes, hypothyroidism, hypertension, hyperlipidemia who [facility] with severe depression and suicidal ideation, She was evaluated by psychiatry and deemed appropriate for inpatient psychiatric treatment and [facility]  While hospitalized she had multiple unwitnessed fallsand seemed to have some confusion overnight between 5/11-5/12 and [facility]. Imaging and work-up at the time was unremarkable for fracture and noted to be alert and oriented and was [facility]. On 5/13 she was evaluated by OT who deemed the patient not appropriate to return home and live independently due to safety risk, cognitive impairments and inability to safely engage in ADLs and was recommended to discharge to SNF. She was started on Macrobid for a UTI on 5/15. She has had not had much improvement since being treated for UTI. Of note, she was placed on Xarelto several days ago for DVT prophylaxis.  the patient was relatively independent in terms of ADLs prior to [facility] but [facility] she has had a rapid and progressive decline in functionality despite decreasing her risperidone. She also notes that her Depakote has been increased over the past week in order to return to her PTA dosage. Dr. Mason JimSingleton also reported sacral pressure ulcers. On her most recent OT assessment yesterday, she required maximum cues and was noted that she had slurred speech, and impaired insight. Physical therapy has also noted that she now requires mod 2/max assist.  [facility] due to failure to thrive/confusion  Subjective:   Reports feeling better, she is doing better on dysphagia diet, speech is working with her in the room She remains very weak, required max assist per CNA Speech is slurred and delayed but no agitation, following commands  Assessment & Plan:   Principal Problem:   Failure to thrive in adult Active Problems:   Diabetes type 2, controlled (HCC)   Hypothyroidism   Bipolar 1 disorder, mixed, severe (HCC)   Hyperlipidemia   Acute encephalopathy   FTT (failure to thrive) in adult    FTT/rapid decline in functional status/acute encephalopathy of unclear etiology: CT of the head no acute findings She has no leukocytosis, no fever She has mild ammonia elevation at 43, valproic level at 38 Appear improving as respiratory and Depakote dose coming down Generalized weakness, PT recommends skilled nursing facility placement Dysphagia, speech recommended dysphagia diet/aspiration precaution  Bipolar disorder with severe depression and suicidal ideation Management per psychiatry  Noninsulin-dependent type 2 diabetes A1c 6.5 A.m. fasting blood glucose 93 Currently not on any meds, home medication GLP-1 agonist held Continue carb modified diet  Hyperlipidemia continue Crestor  Hypothyroidism Continue Synthroid supplement TSH 0.4  Class III obesity The patient's BMI is: Body mass index is 44.15 kg/m.Marland Kitchen.     Unresulted Labs (From admission, onward)          Start     Ordered   10/08/20 0500  Basic metabolic panel  Daily,   R      10/07/20 1511   10/08/20 0500  CBC  Daily,   R  10/07/20 1511   Signed and Held  Urinalysis, Routine w reflex microscopic  Once,   R        Signed and Held            DVT prophylaxis: enoxaparin (LOVENOX) injection 40 mg Start: 10/07/20 1800   Code Status: Full Family Communication: sister over the phone on 5/26 Disposition:   Status is: Inpatient   Dispo: The  patient is from: Behavioral health              Anticipated d/c is to: Need skilled nursing facility placement once cleared by psychiatry              Anticipated d/c date is: To be determined                Consultants:   Psychiatry  Procedures:   None  Antimicrobials:    Anti-infectives (From admission, onward)   None         Objective: Vitals:   10/08/20 0444 10/08/20 1525 10/08/20 2126 10/09/20 0524  BP: (!) 142/68 130/65 132/65 (!) 140/98  Pulse: (!) 53 (!) 52 (!) 57 63  Resp: 18 16 20 18   Temp: 98.2 F (36.8 C) 97.6 F (36.4 C) 98 F (36.7 C) 98 F (36.7 C)  TempSrc:  Oral    SpO2: 97% 95% 100% 97%  Weight:      Height:        Intake/Output Summary (Last 24 hours) at 10/09/2020 1321 Last data filed at 10/09/2020 1144 Gross per 24 hour  Intake 25 ml  Output 1850 ml  Net -1825 ml   Filed Weights   10/07/20 1310  Weight: 106 kg    Examination:  General exam: calm, NAD, speech is slurred and delayed, not oriented to the month,  Respiratory system: Clear to auscultation. Respiratory effort normal. Cardiovascular system: S1 & S2 heard, RRR. No JVD, no murmur, No pedal edema. Gastrointestinal system: Abdomen is nondistended, soft and nontender. Normal bowel sounds heard. Central nervous system: Alert and oriented. No focal neurological deficits. Extremities: generalized weakness, barely able to lift both legs against gravity  Skin: No rashes, lesions or ulcers Psychiatry: flat affect , no agitation, speech is slurred and delayed, following commands     Data Reviewed: I have personally reviewed following labs and imaging studies  CBC: Recent Labs  Lab 10/06/20 0721 10/07/20 1329 10/08/20 0526 10/09/20 0503  WBC 7.0 7.4 5.9 7.7  NEUTROABS 4.2 4.5  --   --   HGB 13.7 14.4 13.0 13.9  HCT 42.2 44.8 39.2 42.9  MCV 95.9 96.3 94.5 95.8  PLT 167 205 169 176    Basic Metabolic Panel: Recent Labs  Lab 10/07/20 1329 10/08/20 0526 10/09/20 0503   NA 138 140 138  K 4.7 3.9 3.8  CL 98 103 101  CO2 30 30 30   GLUCOSE 121* 93 99  BUN 15 11 13   CREATININE 1.01* 0.79 0.78  CALCIUM 9.7 9.1 9.5    GFR: Estimated Creatinine Clearance: 74.5 mL/min (by C-G formula based on SCr of 0.78 mg/dL).  Liver Function Tests: Recent Labs  Lab 10/07/20 1329  AST 19  ALT 14  ALKPHOS 52  BILITOT 0.4  PROT 7.3  ALBUMIN 3.9    CBG: Recent Labs  Lab 10/05/20 1148 10/05/20 1659 10/05/20 1751 10/06/20 0536 10/07/20 0630  GLUCAP 90 99 99 96 110*     Recent Results (from the past 240 hour(s))  Resp Panel by RT-PCR (Flu A&B,  Covid) Nasopharyngeal Swab     Status: None   Collection Time: 10/07/20  2:36 PM   Specimen: Nasopharyngeal Swab; Nasopharyngeal(NP) swabs in vial transport medium  Result Value Ref Range Status   SARS Coronavirus 2 by RT PCR NEGATIVE NEGATIVE Final    Comment: (NOTE) SARS-CoV-2 target nucleic acids are NOT DETECTED.  The SARS-CoV-2 RNA is generally detectable in upper respiratory specimens during the acute phase of infection. The lowest concentration of SARS-CoV-2 viral copies this assay can detect is 138 copies/mL. A negative result does not preclude SARS-Cov-2 infection and should not be used as the sole basis for treatment or other patient management decisions. A negative result may occur with  improper specimen collection/handling, submission of specimen other than nasopharyngeal swab, presence of viral mutation(s) within the areas targeted by this assay, and inadequate number of viral copies(<138 copies/mL). A negative result must be combined with clinical observations, patient history, and epidemiological information. The expected result is Negative.  Fact Sheet for Patients:  BloggerCourse.com  Fact Sheet for Healthcare Providers:  SeriousBroker.it  This test is no t yet approved or cleared by the [place] FDA and  has been authorized for  detection and/or diagnosis of SARS-CoV-2 by FDA under an Emergency Use Authorization (EUA). This EUA will remain  in effect (meaning this test can be used) for the duration of the COVID-19 declaration under Section 564(b)(1) of the Act, 21 U.S.C.section 360bbb-3(b)(1), unless the authorization is terminated  or revoked sooner.       Influenza A by PCR NEGATIVE NEGATIVE Final   Influenza B by PCR NEGATIVE NEGATIVE Final    Comment: (NOTE) The Xpert Xpress SARS-CoV-2/FLU/RSV plus assay is intended as an aid in the diagnosis of influenza from Nasopharyngeal swab specimens and should not be used as a sole basis for treatment. Nasal washings and aspirates are unacceptable for Xpert Xpress SARS-CoV-2/FLU/RSV testing.  Fact Sheet for Patients: BloggerCourse.com  Fact Sheet for Healthcare Providers: SeriousBroker.it  This test is not yet approved or cleared by the [place] FDA and has been authorized for detection and/or diagnosis of SARS-CoV-2 by FDA under an Emergency Use Authorization (EUA). This EUA will remain in effect (meaning this test can be used) for the duration of the COVID-19 declaration under Section 564(b)(1) of the Act, 21 U.S.C. section 360bbb-3(b)(1), unless the authorization is terminated or revoked.  Performed at [facility], [address] [place], [place] [address]          Radiology Studies: CT HEAD WO CONTRAST  Result Date: 10/07/2020 CLINICAL DATA:  Altered mental status. EXAM: CT HEAD WITHOUT CONTRAST TECHNIQUE: Contiguous axial images were obtained from the base of the skull through the vertex without intravenous contrast. COMPARISON:  Sep 25, 2020. FINDINGS: Brain: Stable postoperative encephalomalacia involving the right cerebellar hemisphere. No mass effect or midline shift is noted. Ventricular size is within normal limits. There is no evidence of mass lesion, hemorrhage or  acute infarction. Vascular: No hyperdense vessel or unexpected calcification. Skull: Status post right occipital craniotomy. No acute abnormality is noted. Sinuses/Orbits: No acute finding. Other: None. IMPRESSION: No acute intracranial abnormality seen. Electronically Signed   By: Lupita Raider M.D.   On: 10/07/2020 16:13   DG CHEST PORT 1 VIEW  Result Date: 10/07/2020 CLINICAL DATA:  Failure to thrive. EXAM: PORTABLE CHEST 1 VIEW COMPARISON:  Chest x-ray 05/14/2019. FINDINGS: Mediastinum and hilar structures normal. Heart size normal. No focal infiltrate. No pleural effusion or pneumothorax. No acute bony abnormality.  IMPRESSION: No acute cardiopulmonary disease. Electronically Signed   By: Maisie Fus  Register   On: 10/07/2020 15:02        Scheduled Meds: . aspirin  81 mg Oral Daily  . benztropine  0.5 mg Oral BID  . buPROPion  300 mg Oral Daily  . divalproex  750 mg Oral QHS  . enoxaparin (LOVENOX) injection  40 mg Subcutaneous Q24H  . levothyroxine  150 mcg Oral Daily  . multivitamin  15 mL Oral Daily  . Ensure Max Protein  11 oz Oral BID  . risperiDONE  2 mg Oral QHS  . rosuvastatin  40 mg Oral Daily  . senna-docusate  1 tablet Oral QHS  . sodium chloride flush  3 mL Intravenous Q12H   Continuous Infusions:   LOS: 1 day   Time spent: 15 mins Greater than 50% of this time was spent in counseling, explanation of diagnosis, planning of further management, and coordination of care.   Voice Recognition Reubin Milan dictation system was used to create this note, attempts have been made to correct errors. Please contact the author with questions and/or clarifications.   Albertine Grates, MD PhD FACP [facility]  Available via Epic secure chat 7am-7pm for nonurgent issues Please page for urgent issues To page the attending provider between 7A-7P or the covering provider during after hours 7P-7A, please log into the web site [email or web address] and access using universal [place] password  for that web site. If you do not have the password, please call the hospital operator.    10/09/2020, 1:21 PM

## 2020-10-09 NOTE — TOC Progression Note (Signed)
Transition of Care [facility]) - Progression Note    Patient Details  Name: Julie Morrow MRN: [id number] Date of Birth: [birth year]  Transition of [facility] CM/SW Contact  Ida Rogue, [place] Phone Number: 10/09/2020, 12:34 PM  Clinical Narrative:   I circled back around to check in with [name].  She was in bed asleep with the TV on.  She awoke easily, and indicated she needed a drink of water, which I assisted her with.  I was unable to understand anything that she said.  Her words were undecipherable to me.  I told her I was looking for placement for her. I asked if she understood.  She stared at me, then slowly nodded yes. TOC will continue to follow during the course of hospitalization.     Expected Discharge Plan: Skilled Nursing Facility Barriers to Discharge: SNF Pending bed offer  Expected Discharge Plan and Services Expected Discharge Plan: Skilled Nursing Facility   Discharge Planning Services: CM Consult Post Acute Care Choice: Skilled Nursing Facility Living arrangements for the past 2 months: Single Family Home                                       Social Determinants of Health (SDOH) Interventions    Readmission Risk Interventions No flowsheet data found.

## 2020-10-09 NOTE — Progress Notes (Signed)
Initial Nutrition Assessment  DOCUMENTATION CODES:   Morbid obesity  INTERVENTION:   -Ensure MAX Protein po BID, each supplement provides 150 kcal and 30 grams of protein  -Liquid MVI daily  NUTRITION DIAGNOSIS:   Inadequate oral intake related to dysphagia as evidenced by per patient/family report.  GOAL:   Patient will meet greater than or equal to 90% of their needs  MONITOR:   PO intake,Supplement acceptance,Labs,Weight trends,I & O's  REASON FOR ASSESSMENT:   Malnutrition Screening Tool    ASSESSMENT:   70 y.o. female with a past medical history of bipolar 1 disorder, type 2 diabetes, hypothyroidism, hypertension, hyperlipidemia who initially presented to [facility] ED on 5/3 with severe depression and suicidal ideation.  Patient currently alert/oriented x 1. Pt coming from [facility], first admitted there earlier this month. Has had progressively worsening weakness. Pt consumed 100% of meals yesterday on solid diet. SLP evaluated pt yesterday and recommended dysphagia 1 diet with thin liquids given moderate aspiration risk.  Only consumed 5% of grits this morning. Have ordered Ensure Max supplements for additional kcals and protein. Will switch to higher calorie supplement if PO intake remains low.  Per weight records, pt has lost 66 lbs since 4/30 (22% wt loss x <1 month, significant for time frame).   Medications: Senokot  Labs reviewed.  NUTRITION - FOCUSED PHYSICAL EXAM:  No depletions noted, may be masked by obesity.  Diet Order:   Diet Order            DIET - DYS 1 Room service appropriate? Yes; Fluid consistency: Thin  Diet effective now                 EDUCATION NEEDS:   No education needs have been identified at this time  Skin:  Skin Assessment: Reviewed RN Assessment  Last BM:  PTA  Height:   Ht Readings from Last 1 Encounters:  10/07/20 5\' 1"  (1.549 m)    Weight:   Wt Readings from Last 1 Encounters:  10/07/20 106 kg    BMI:  Body  mass index is 44.15 kg/m.  Estimated Nutritional Needs:   Kcal:  1600-1800  Protein:  70-85g  Fluid:  1.8L/day  10/09/20, MS, RD, LDN Inpatient Clinical Dietitian Contact information available via Amion

## 2020-10-09 NOTE — Consult Note (Signed)
  Pt is a 70 y/o female with PMHx of bipolar disorder, anxiety disorder, depression who presented to the [facility] ED 09/16/2020 and then admitted to [facility] on 09/20/2020. PT ordered/consulted to address and assess recent decline in functional mobility. Patient was modified independnet on 09/22/20 PT eval.  Then while patient was admitted to behavioral health Hospital, pt was assessed by hospitalist team for chief complaint of change in mental status, who advised that patient come to the hospital and be admitted for observation.  Psych consult placed for assistance with medications and management of bipolar disorder. Patient is seen and assessed this morning. She is observed to sleeping at this time, however she did awake easily. She was able to follow commands, and answered most questions appropriately. She is noted to have slurred speech.  She was also able to engage in conversation with this Clinical research associate, and have linear conversation. She continues to be psychiatric stable as she no longer endorses depressed mood and denies suicidal ideations. She remains compliant on her psychotropic medications. At this time she is on Trazodone 150mg  po qhs prn sleep, Risperdal 2mg  po qhs for schizophrnia, Depakote ER 750 po QHS for bipolar disorder(depakote level 38), Wellbutrin XL 300mg  po daily, and cogentin 0.5mg  po BID. She denies suicidal ideations, homicidal ideations and or hallucinations. SHe does not appear to be exhibiting any psychosis, delusional thinking, and or responding to internal stimuli.     -TOC is currently initiating referrals for SNF.  -Lab review patient had urinary tract infection on admission that has been treated. Cognitive impairment may lag behind as infection begins to clear.  -Psychiatry to sign off at this time. Patient is now psychiatric stable on current medication regimen. No medication changes to be at this time as she appears to be improving.

## 2020-10-09 NOTE — Progress Notes (Signed)
  Speech Language Pathology Treatment: Dysphagia  Patient Details Name: Julie Morrow MRN: [id number] DOB: 02-09-1951 Today's Date: 10/09/2020 Time: 0315-9458 SLP Time Calculation (min) (ACUTE ONLY): 14 min  Assessment / Plan / Recommendation Clinical Impression  Pt today to assure po tolerance with diet moficiation.  Today pt asleep upon arrival to room.  She woke however to accept lunch intake of mashed potatoes, carrots and pureed beef.  Pt able to self feed with intermittent assist to reach mouth with spoon.  Prolonged oral transit intermittently noted but no indication of aspiration as observed yesterday. Pt remains dysarthric today with decreased sustained attention to tasks.   Pt will require ongoing full supervision with po for safety due to her impulsivity with intake.  Encourage pt to self feed for neuro input-safety. Educated pt to findings/recommendations.   Recommend continue current diet due to pt's dysarthria as dys1/thin diet is more efficient for pt to tolerate.  Follow up at [facility] for dysphagia management advised.     HPI HPI: 70 year old female with history of diabetes, bipolar disorder comes in with chief complaint of change in mental status.  Patient was admitted to behavioral health hospital and was sent to hospital   Swallow evaluation ordered.  CT head and CXR negative.  Marland Kitchen      SLP Plan  Continue with current plan of care       Recommendations  Diet recommendations: Dysphagia 1 (puree);Thin liquid Liquids provided via: Cup Medication Administration: Whole meds with puree Compensations: Slow rate;Small sips/bites Postural Changes and/or Swallow Maneuvers: Seated upright 90 degrees;Upright 30-60 min after meal                Oral Care Recommendations: Oral care BID SLP Visit Diagnosis: Dysphagia, oral phase (R13.11) Plan: Continue with current plan of care       GO                Julie Morrow 10/09/2020, 12:55 PM Julie Infante, MS [facility] SLP Acute  Rehab Services Office [phone] Pager [phone]

## 2020-10-09 NOTE — [place] FL2 (Signed)
[place] MEDICAID FL2 LEVEL OF CARE SCREENING TOOL     IDENTIFICATION  Patient Name: Julie Morrow Birthdate: 1950/12/09 Sex: female Admission Date (Current Location): 10/07/2020  [facility] and [place] Number:  Producer, television/film/video and Address:  [facility],  [address], [place] [address]      Provider Number: [id number]  Attending Physician Name and Address:  Albertine Grates, MD  Relative Name and Phone Number:  Nehemiah Settle, friend, [phone]    Current Level of Care: Hospital Recommended Level of Care: Skilled Nursing Facility Prior Approval Number:    Date Approved/Denied:   PASRR Number:    Discharge Plan: SNF    Current Diagnoses: Patient Active Problem List   Diagnosis Date Noted  . FTT (failure to thrive) in adult 10/08/2020  . Depression 10/07/2020  . Thyroid disease 10/07/2020  . Hyperlipidemia 10/07/2020  . Failure to thrive in adult 10/07/2020  . Acute encephalopathy 10/07/2020  . Major neurocognitive disorder (HCC) 09/27/2020  . Diabetes (HCC) 09/23/2020  . Adjustment disorder with anxiety 09/13/2020  . Family discord 12/05/2019  . AMS (altered mental status) 05/14/2019  . Acute lower UTI 05/14/2019  . Adjustment disorder with mixed disturbance of emotions and conduct   . Bipolar I disorder with mania (HCC) 12/27/2018  . PAD (peripheral artery disease) (HCC) 10/16/2018  . GAD (generalized anxiety disorder) 03/04/2018  . Insomnia 03/04/2018  . Situational depression 03/04/2018  . Bipolar depression (HCC) 03/04/2018  . Acute kidney injury (HCC) 05/30/2017  . Bradycardia   . Lithium toxicity   . COPD GOLD 0 / still smoking  12/22/2016  . Generalized weakness 10/27/2015  . GERD (gastroesophageal reflux disease) 10/15/2015  . Bipolar 1 disorder, mixed, severe (HCC) 10/11/2015  . Diabetes type 2, controlled (HCC) 07/07/2015  . Morbid (severe) obesity due to excess calories (HCC) 07/07/2015  . Cigarette smoker 07/07/2015  .  Hypothyroidism 07/07/2015  . HLD (hyperlipidemia) 11/07/2014  . Psoriasis 11/07/2014    Orientation RESPIRATION BLADDER Height & Weight     Self,Place  Normal External catheter Weight: 106 kg Height:  5\' 1"  (154.9 cm)  BEHAVIORAL SYMPTOMS/MOOD NEUROLOGICAL BOWEL NUTRITION STATUS      Incontinent Diet (see d/c summary)  AMBULATORY STATUS COMMUNICATION OF NEEDS Skin   Extensive Assist Verbally Normal                       Personal Care Assistance Level of Assistance  Bathing,Feeding,Dressing Bathing Assistance: Maximum assistance Feeding assistance: Independent Dressing Assistance: Limited assistance     Functional Limitations Info  Sight,Hearing,Speech Sight Info: Adequate Hearing Info: Adequate Speech Info: Adequate    SPECIAL CARE FACTORS FREQUENCY                       Contractures Contractures Info: Not present    Additional Factors Info  Code Status,Allergies Code Status Info: full Allergies Info: NKA           Current Medications (10/09/2020):  This is the current hospital active medication list Current Facility-Administered Medications  Medication Dose Route Frequency Provider Last Rate Last Admin  . acetaminophen (TYLENOL) tablet 650 mg  650 mg Oral Q6H PRN 10/11/2020, MD       Or  . acetaminophen (TYLENOL) suppository 650 mg  650 mg Rectal Q6H PRN Jae Dire, MD      . aspirin chewable tablet 81 mg  81 mg Oral Daily Jae Dire, MD  81 mg at 10/09/20 0850  . benztropine (COGENTIN) tablet 0.5 mg  0.5 mg Oral BID Jae Dire, MD   0.5 mg at 10/09/20 0850  . buPROPion (WELLBUTRIN XL) 24 hr tablet 300 mg  300 mg Oral Daily Jae Dire, MD   300 mg at 10/09/20 0849  . divalproex (DEPAKOTE ER) 24 hr tablet 750 mg  750 mg Oral QHS Jae Dire, MD   750 mg at 10/08/20 2253  . enoxaparin (LOVENOX) injection 40 mg  40 mg Subcutaneous Q24H Jae Dire, MD   40 mg at 10/08/20 1653  . levothyroxine (SYNTHROID) tablet 150 mcg  150  mcg Oral Daily Jae Dire, MD   150 mcg at 10/09/20 0546  . polyethylene glycol (MIRALAX / GLYCOLAX) packet 17 g  17 g Oral Daily PRN Jae Dire, MD      . risperiDONE (RISPERDAL) tablet 2 mg  2 mg Oral QHS Jae Dire, MD   2 mg at 10/08/20 2253  . rosuvastatin (CRESTOR) tablet 40 mg  40 mg Oral Daily Jae Dire, MD   40 mg at 10/09/20 0850  . senna-docusate (Senokot-S) tablet 1 tablet  1 tablet Oral QHS Albertine Grates, MD   1 tablet at 10/08/20 2254  . sodium chloride flush (NS) 0.9 % injection 3 mL  3 mL Intravenous Q12H Jae Dire, MD   3 mL at 10/09/20 0845  . traZODone (DESYREL) tablet 150 mg  150 mg Oral QHS PRN Jae Dire, MD   150 mg at 10/07/20 2148     Discharge Medications: Please see discharge summary for a list of discharge medications.  Relevant Imaging Results:  Relevant Lab Results:   Additional Information [id number] Social Security  Baldo Daub [place], [place]

## 2020-10-10 DIAGNOSIS — R627 Adult failure to thrive: Secondary | ICD-10-CM | POA: Diagnosis not present

## 2020-10-10 LAB — URINALYSIS, ROUTINE W REFLEX MICROSCOPIC
Bilirubin Urine: NEGATIVE
Glucose, UA: NEGATIVE mg/dL
Hgb urine dipstick: NEGATIVE
Ketones, ur: NEGATIVE mg/dL
Nitrite: POSITIVE — AB
Protein, ur: 30 mg/dL — AB
Specific Gravity, Urine: 1.012 (ref 1.005–1.030)
WBC, UA: >50 WBC/hpf — ABNORMAL HIGH (ref 0–5)
pH: 9 — ABNORMAL HIGH (ref 5.0–8.0)

## 2020-10-10 LAB — BASIC METABOLIC PANEL
Anion gap: 9 (ref 5–15)
BUN: 16 mg/dL (ref 8–23)
CO2: 30 mmol/L (ref 22–32)
Calcium: 10.1 mg/dL (ref 8.9–10.3)
Chloride: 99 mmol/L (ref 98–111)
Creatinine, Ser: 0.96 mg/dL (ref 0.44–1.00)
GFR, Estimated: >60 mL/min (ref >60)
Glucose, Bld: 109 mg/dL — ABNORMAL HIGH (ref 70–99)
Potassium: 3.5 mmol/L (ref 3.5–5.1)
Sodium: 138 mmol/L (ref 135–145)

## 2020-10-10 LAB — CBC
HCT: 43.5 % (ref 36.0–46.0)
Hemoglobin: 14.4 g/dL (ref 12.0–15.0)
MCH: 31.6 pg (ref 26.0–34.0)
MCHC: 33.1 g/dL (ref 30.0–36.0)
MCV: 95.4 fL (ref 80.0–100.0)
Platelets: 184 10*3/uL (ref 150–400)
RBC: 4.56 MIL/uL (ref 3.87–5.11)
RDW: 13.4 % (ref 11.5–15.5)
WBC: 7.8 10*3/uL (ref 4.0–10.5)
nRBC: 0 % (ref 0.0–0.2)

## 2020-10-10 MED ORDER — SODIUM CHLORIDE 0.9 % IV SOLN: INTRAVENOUS | Status: AC

## 2020-10-10 NOTE — Progress Notes (Signed)
Patient noted with odorless, whitish, cottage cheese looking discharge when performing  perineal care. Patient remains confused and was unable to let staff know if she is experiencing any pain/discomfort.

## 2020-10-10 NOTE — TOC Progression Note (Signed)
Transition of Care [facility]) - Progression Note    Patient Details  Name: Julie Morrow MRN: [id number] Date of Birth: [birth year]  Transition of Care [facility]) CM/SW Contact  Ida Rogue, [place] Phone Number: 10/10/2020, 8:54 AM  Clinical Narrative:   CSW attempted to contact Mrs. Julie Morrow at APS [phone] and [phone]. Unable to leave message on full mailbox.  Left message for supervisor Julie Morrow at [phone]. TOC will continue to follow during the course of hospitalization.     Expected Discharge Plan: Skilled Nursing Facility Barriers to Discharge: SNF Pending bed offer  Expected Discharge Plan and Services Expected Discharge Plan: Skilled Nursing Facility   Discharge Planning Services: CM Consult Post Acute Care Choice: Skilled Nursing Facility Living arrangements for the past 2 months: Single Family Home                                       Social Determinants of Health (SDOH) Interventions    Readmission Risk Interventions No flowsheet data found.

## 2020-10-10 NOTE — Progress Notes (Addendum)
Patient had complete bed bath and change of linens.

## 2020-10-10 NOTE — Progress Notes (Addendum)
PROGRESS NOTE    Julie DamesSusan B Jons  ZOX:[id number] DOB: 02-11-1951 DOA: 10/07/2020 PCP: Patient, No Pcp Per (Inactive)    Chief Complaint  Patient presents with  . Failure To Thrive    Brief Narrative:  This is a69 y.o.femalewith a past medical history of bipolar 1 disorder, type 2 diabetes, hypothyroidism, hypertension, hyperlipidemia who [facility] with severe depression and suicidal ideation, She was evaluated by psychiatry and deemed appropriate for inpatient psychiatric treatment and was [facility]  While hospitalized she had multiple unwitnessed fallsand seemed to have some confusion overnight between 5/11-5/12 and [facility]. Imaging and work-up at the time was unremarkable for fracture and noted to be alert and oriented and was discharged [facility] 5/12 [facility]. On 5/13 she was evaluated by OT who deemed the patient not appropriate to return home and live independently due to safety risk, cognitive impairments and inability to safely engage in ADLs and was recommended to discharge to SNF. She was started on Macrobid for a UTI on 5/15. She has had not had much improvement since being treated for UTI. Of note, she was placed on Xarelto several days ago for DVT prophylaxis.  the patient was relatively independent in terms of ADLs prior to [facility] but [facility] she has had a rapid and progressive decline in functionality despite decreasing her risperidone. She also notes that her Depakote has been increased over the past week in order to return to her PTA dosage. Dr. Mason JimSingleton also reported sacral pressure ulcers. On her most recent OT assessment yesterday, she required maximum cues and was noted that she had slurred speech, and impaired insight. Physical therapy has also noted that she now requires mod 2/max assist.  [facility] due to failure to thrive/confusion  Subjective:  Appear slightly stronger and more alert, speech is still slurred, but oriented x3  no agitation, following commands Friends at bedside  Assessment & Plan:   Principal Problem:   Failure to thrive in adult Active Problems:   Diabetes type 2, controlled (HCC)   Hypothyroidism   Bipolar 1 disorder, mixed, severe (HCC)   Hyperlipidemia   Acute encephalopathy   FTT (failure to thrive) in adult    FTT/rapid decline in functional status/acute encephalopathy of unclear etiology: CT of the head no acute findings She has no leukocytosis, no fever She has mild ammonia elevation at 43, valproic level at 38 Appear improving as respiratory and Depakote dose coming down Generalized weakness, PT recommends skilled nursing facility placement Dysphagia, speech recommended dysphagia diet/aspiration precaution Poor oral intake, will start hydration  Bipolar disorder with severe depression and suicidal ideation Management per psychiatry  Noninsulin-dependent type 2 diabetes A1c 6.5 A.m. fasting blood glucose 93 Currently not on any meds, home medication GLP-1 agonist held Continue carb modified diet  Hyperlipidemia continue Crestor  Hypothyroidism Continue Synthroid supplement TSH 0.4  Class III obesity The patient's BMI is: Body mass index is 44.15 kg/m.Marland Kitchen.   odorless, whitish, cottage cheese looking discharge when performing  perineal care Patient denies pain, or itch No rash check urine GC chlamydia, UA Vaginal swab as well    Unresulted Labs (From admission, onward)          Start     Ordered   10/10/20 0807  Urinalysis, Routine w reflex microscopic  Once,  R        10/10/20 0807   10/08/20 0500  Basic metabolic panel  Daily,   R      10/07/20 1511   10/08/20 0500  CBC  Daily,   R      10/07/20 1511   Signed and Held  Urinalysis, Routine w reflex microscopic  Once,   R        Signed and Held             DVT prophylaxis: enoxaparin (LOVENOX) injection 40 mg Start: 10/07/20 1800   Code Status: Full Family Communication: sister over the phone on 5/26, friends at bedside with permission on 5/27 Disposition:   Status is: Inpatient   Dispo: The patient is from: Behavioral health              Anticipated d/c is to: Need skilled nursing facility placement once cleared by psychiatry              Anticipated d/c date is: To be determined                Consultants:   Psychiatry  Procedures:   None  Antimicrobials:    Anti-infectives (From admission, onward)   None         Objective: Vitals:   10/09/20 1529 10/09/20 2044 10/10/20 0531 10/10/20 1323  BP: 135/85 (!) 154/83 (!) 152/71 (!) 109/93  Pulse: (!) 59 70 74 64  Resp: 20 18 20  (!) 22  Temp: 98.4 F (36.9 C) 97.6 F (36.4 C) (!) 97.3 F (36.3 C) 98.5 F (36.9 C)  TempSrc: Oral Oral Oral   SpO2: 93% 97% 95% 97%  Weight:      Height:        Intake/Output Summary (Last 24 hours) at 10/10/2020 1408 Last data filed at 10/10/2020 0525 Gross per 24 hour  Intake 48 ml  Output 1200 ml  Net -1152 ml   Filed Weights   10/07/20 1310  Weight: 106 kg    Examination:  General exam: calm, NAD, speech is less slurred and appear less delayed , she is AAOX3 today,  Respiratory system: Clear to auscultation. Respiratory effort normal. Cardiovascular system: S1 & S2 heard, RRR. No JVD, no murmur, No pedal edema. Gastrointestinal system: Abdomen is nondistended, soft and nontender. Normal bowel sounds heard. Central nervous system: Alert and oriented. No focal neurological deficits. Extremities: generalized weakness, appear able to  lift both legs against gravity slightly higher but still less than 30degress  Skin: No rashes, lesions or ulcers Psychiatry: flat affect , no agitation, speech is slurred and delayed, following commands     Data Reviewed: I have personally reviewed following labs and imaging  studies  CBC: Recent Labs  Lab 10/06/20 0721 10/07/20 1329 10/08/20 0526 10/09/20 0503 10/10/20 0556  WBC 7.0 7.4 5.9 7.7 7.8  NEUTROABS 4.2 4.5  --   --   --   HGB 13.7 14.4 13.0 13.9 14.4  HCT 42.2 44.8 39.2 42.9 43.5  MCV 95.9 96.3 94.5 95.8 95.4  PLT 167 205 169 176 184    Basic Metabolic Panel: Recent Labs  Lab 10/07/20 1329 10/08/20 0526 10/09/20 0503 10/10/20 0556  NA 138 140 138 138  K 4.7 3.9 3.8 3.5  CL 98 103 101 99  CO2 30 30 30 30   GLUCOSE 121* 93 99 109*  BUN 15 11 13 16   CREATININE 1.01* 0.79 0.78 0.96  CALCIUM 9.7 9.1 9.5 10.1    GFR:  Estimated Creatinine Clearance: 62.1 mL/min (by C-G formula based on SCr of 0.96 mg/dL).  Liver Function Tests: Recent Labs  Lab 10/07/20 1329  AST 19  ALT 14  ALKPHOS 52  BILITOT 0.4  PROT 7.3  ALBUMIN 3.9    CBG: Recent Labs  Lab 10/05/20 1148 10/05/20 1659 10/05/20 1751 10/06/20 0536 10/07/20 0630  GLUCAP 90 99 99 96 110*     Recent Results (from the past 240 hour(s))  Resp Panel by RT-PCR (Flu A&B, Covid) Nasopharyngeal Swab     Status: None   Collection Time: 10/07/20  2:36 PM   Specimen: Nasopharyngeal Swab; Nasopharyngeal(NP) swabs in vial transport medium  Result Value Ref Range Status   SARS Coronavirus 2 by RT PCR NEGATIVE NEGATIVE Final    Comment: (NOTE) SARS-CoV-2 target nucleic acids are NOT DETECTED.  The SARS-CoV-2 RNA is generally detectable in upper respiratory specimens during the acute phase of infection. The lowest concentration of SARS-CoV-2 viral copies this assay can detect is 138 copies/mL. A negative result does not preclude SARS-Cov-2 infection and should not be used as the sole basis for treatment or other patient management decisions. A negative result may occur with  improper specimen collection/handling, submission of specimen other than nasopharyngeal swab, presence of viral mutation(s) within the areas targeted by this assay, and inadequate number of  viral copies(<138 copies/mL). A negative result must be combined with clinical observations, patient history, and epidemiological information. The expected result is Negative.  Fact Sheet for Patients:  BloggerCourse.com  Fact Sheet for Healthcare Providers:  SeriousBroker.it  This test is no t yet approved or cleared by the [place] FDA and  has been authorized for detection and/or diagnosis of SARS-CoV-2 by FDA under an Emergency Use Authorization (EUA). This EUA will remain  in effect (meaning this test can be used) for the duration of the COVID-19 declaration under Section 564(b)(1) of the Act, 21 U.S.C.section 360bbb-3(b)(1), unless the authorization is terminated  or revoked sooner.       Influenza A by PCR NEGATIVE NEGATIVE Final   Influenza B by PCR NEGATIVE NEGATIVE Final    Comment: (NOTE) The Xpert Xpress SARS-CoV-2/FLU/RSV plus assay is intended as an aid in the diagnosis of influenza from Nasopharyngeal swab specimens and should not be used as a sole basis for treatment. Nasal washings and aspirates are unacceptable for Xpert Xpress SARS-CoV-2/FLU/RSV testing.  Fact Sheet for Patients: BloggerCourse.com  Fact Sheet for Healthcare Providers: SeriousBroker.it  This test is not yet approved or cleared by the [place] FDA and has been authorized for detection and/or diagnosis of SARS-CoV-2 by FDA under an Emergency Use Authorization (EUA). This EUA will remain in effect (meaning this test can be used) for the duration of the COVID-19 declaration under Section 564(b)(1) of the Act, 21 U.S.C. section 360bbb-3(b)(1), unless the authorization is terminated or revoked.  Performed at [facility], [address] [place], [place] [address]          Radiology Studies: No results found.      Scheduled Meds: . aspirin  81 mg Oral  Daily  . benztropine  0.5 mg Oral BID  . buPROPion  300 mg Oral Daily  . divalproex  750 mg Oral QHS  . enoxaparin (LOVENOX) injection  40 mg Subcutaneous Q24H  . levothyroxine  150 mcg Oral Daily  . multivitamin  15 mL Oral Daily  . Ensure Max Protein  11 oz Oral BID  . risperiDONE  2 mg Oral QHS  .  rosuvastatin  40 mg Oral Daily  . senna-docusate  1 tablet Oral QHS  . sodium chloride flush  3 mL Intravenous Q12H   Continuous Infusions:   LOS: 2 days   Time spent: 25 mins Greater than 50% of this time was spent in counseling, explanation of diagnosis, planning of further management, and coordination of care.   Voice Recognition Reubin [place] dictation system was used to create this note, attempts have been made to correct errors. Please contact the author with questions and/or clarifications.   Albertine Grates, MD PhD FACP [facility]  Available via Epic secure chat 7am-7pm for nonurgent issues Please page for urgent issues To page the attending provider between 7A-7P or the covering provider during after hours 7P-7A, please log into the web site [email or web address] and access using universal [place] password for that web site. If you do not have the password, please call the hospital operator.    10/10/2020, 2:08 PM

## 2020-10-10 NOTE — [place] FL2 (Signed)
[place] MEDICAID FL2 LEVEL OF CARE SCREENING TOOL     IDENTIFICATION  Patient Name: Julie Morrow Birthdate: 1951-01-01 Sex: female Admission Date (Current Location): 10/07/2020  [facility] and [place] Number:  Producer, television/film/video and Address:  [facility],  [address] [place], [place] [address]      Provider Number: [id number]  Attending Physician Name and Address:  Albertine Grates, MD  Relative Name and Phone Number:  Nehemiah Settle, friend, [phone]    Current Level of Care: Hospital Recommended Level of Care: Skilled Nursing Facility Prior Approval Number:    Date Approved/Denied:   PASRR Number: [id number]  Good through 6/25  Discharge Plan: SNF    Current Diagnoses: Patient Active Problem List   Diagnosis Date Noted  . FTT (failure to thrive) in adult 10/08/2020  . Depression 10/07/2020  . Thyroid disease 10/07/2020  . Hyperlipidemia 10/07/2020  . Failure to thrive in adult 10/07/2020  . Acute encephalopathy 10/07/2020  . Major neurocognitive disorder (HCC) 09/27/2020  . Diabetes (HCC) 09/23/2020  . Adjustment disorder with anxiety 09/13/2020  . Family discord 12/05/2019  . AMS (altered mental status) 05/14/2019  . Acute lower UTI 05/14/2019  . Adjustment disorder with mixed disturbance of emotions and conduct   . Bipolar I disorder with mania (HCC) 12/27/2018  . PAD (peripheral artery disease) (HCC) 10/16/2018  . GAD (generalized anxiety disorder) 03/04/2018  . Insomnia 03/04/2018  . Situational depression 03/04/2018  . Bipolar depression (HCC) 03/04/2018  . Acute kidney injury (HCC) 05/30/2017  . Bradycardia   . Lithium toxicity   . COPD GOLD 0 / still smoking  12/22/2016  . Generalized weakness 10/27/2015  . GERD (gastroesophageal reflux disease) 10/15/2015  . Bipolar 1 disorder, mixed, severe (HCC) 10/11/2015  . Diabetes type 2, controlled (HCC) 07/07/2015  . Morbid (severe) obesity due to excess calories (HCC) 07/07/2015  . Cigarette  smoker 07/07/2015  . Hypothyroidism 07/07/2015  . HLD (hyperlipidemia) 11/07/2014  . Psoriasis 11/07/2014    Orientation RESPIRATION BLADDER Height & Weight     Self,Place  Normal External catheter Weight: 106 kg Height:  5\' 1"  (154.9 cm)  BEHAVIORAL SYMPTOMS/MOOD NEUROLOGICAL BOWEL NUTRITION STATUS      Incontinent Diet (see d/c summary)  AMBULATORY STATUS COMMUNICATION OF NEEDS Skin   Extensive Assist Verbally Normal                       Personal Care Assistance Level of Assistance  Bathing,Feeding,Dressing Bathing Assistance: Maximum assistance Feeding assistance: Independent Dressing Assistance: Limited assistance     Functional Limitations Info  Sight,Hearing,Speech Sight Info: Adequate Hearing Info: Adequate Speech Info: Adequate    SPECIAL CARE FACTORS FREQUENCY                       Contractures Contractures Info: Not present    Additional Factors Info  Code Status,Allergies Code Status Info: full Allergies Info: NKA           Current Medications (10/10/2020):  This is the current hospital active medication list Current Facility-Administered Medications  Medication Dose Route Frequency Provider Last Rate Last Admin  . acetaminophen (TYLENOL) tablet 650 mg  650 mg Oral Q6H PRN 10/12/2020, MD       Or  . acetaminophen (TYLENOL) suppository 650 mg  650 mg Rectal Q6H PRN Jae Dire, MD      . aspirin chewable tablet 81 mg  81 mg Oral Daily Jae Dire,  Roe Rutherford, MD   81 mg at 10/09/20 0850  . benztropine (COGENTIN) tablet 0.5 mg  0.5 mg Oral BID Jae Dire, MD   0.5 mg at 10/09/20 2110  . buPROPion (WELLBUTRIN XL) 24 hr tablet 300 mg  300 mg Oral Daily Jae Dire, MD   300 mg at 10/09/20 0849  . divalproex (DEPAKOTE ER) 24 hr tablet 750 mg  750 mg Oral QHS Jae Dire, MD   750 mg at 10/09/20 2109  . enoxaparin (LOVENOX) injection 40 mg  40 mg Subcutaneous Q24H Jae Dire, MD   40 mg at 10/09/20 1731  . levothyroxine (SYNTHROID)  tablet 150 mcg  150 mcg Oral Daily Jae Dire, MD   150 mcg at 10/10/20 7824  . multivitamin liquid 15 mL  15 mL Oral Daily Albertine Grates, MD   15 mL at 10/09/20 1138  . polyethylene glycol (MIRALAX / GLYCOLAX) packet 17 g  17 g Oral Daily PRN Jae Dire, MD      . protein supplement (ENSURE MAX) liquid  11 oz Oral BID Albertine Grates, MD   11 oz at 10/09/20 2110  . risperiDONE (RISPERDAL) tablet 2 mg  2 mg Oral QHS Jae Dire, MD   2 mg at 10/09/20 2110  . rosuvastatin (CRESTOR) tablet 40 mg  40 mg Oral Daily Jae Dire, MD   40 mg at 10/09/20 0850  . senna-docusate (Senokot-S) tablet 1 tablet  1 tablet Oral QHS Albertine Grates, MD   1 tablet at 10/09/20 2110  . sodium chloride flush (NS) 0.9 % injection 3 mL  3 mL Intravenous Q12H Jae Dire, MD   3 mL at 10/09/20 2112  . traZODone (DESYREL) tablet 150 mg  150 mg Oral QHS PRN Jae Dire, MD   150 mg at 10/07/20 2148     Discharge Medications: Please see discharge summary for a list of discharge medications.  Relevant Imaging Results:  Relevant Lab Results:   Additional Information [id number] Social Security  Baldo Daub [place], [place]

## 2020-10-11 DIAGNOSIS — R627 Adult failure to thrive: Secondary | ICD-10-CM | POA: Diagnosis not present

## 2020-10-11 LAB — CBC
HCT: 41.9 % (ref 36.0–46.0)
Hemoglobin: 13.5 g/dL (ref 12.0–15.0)
MCH: 30.6 pg (ref 26.0–34.0)
MCHC: 32.2 g/dL (ref 30.0–36.0)
MCV: 95 fL (ref 80.0–100.0)
Platelets: 192 10*3/uL (ref 150–400)
RBC: 4.41 MIL/uL (ref 3.87–5.11)
RDW: 13.6 % (ref 11.5–15.5)
WBC: 7.1 10*3/uL (ref 4.0–10.5)
nRBC: 0 % (ref 0.0–0.2)

## 2020-10-11 LAB — BASIC METABOLIC PANEL
Anion gap: 11 (ref 5–15)
BUN: 17 mg/dL (ref 8–23)
CO2: 26 mmol/L (ref 22–32)
Calcium: 9.5 mg/dL (ref 8.9–10.3)
Chloride: 102 mmol/L (ref 98–111)
Creatinine, Ser: 0.89 mg/dL (ref 0.44–1.00)
GFR, Estimated: >60 mL/min (ref >60)
Glucose, Bld: 101 mg/dL — ABNORMAL HIGH (ref 70–99)
Potassium: 4.2 mmol/L (ref 3.5–5.1)
Sodium: 139 mmol/L (ref 135–145)

## 2020-10-11 MED ORDER — NYSTATIN 100000 UNIT/GM EX POWD
Freq: Two times a day (BID) | CUTANEOUS | Status: DC
  Filled 2020-10-11 (×3): qty 15

## 2020-10-11 MED ORDER — POLYETHYLENE GLYCOL 3350 17 G PO PACK
17.0000 g | PACK | Freq: Every day | ORAL | Status: DC
  Administered 2020-10-12 – 2020-11-07 (×22): 17 g via ORAL
  Filled 2020-10-11 (×24): qty 1

## 2020-10-11 NOTE — Progress Notes (Addendum)
PROGRESS NOTE    Julie DamesSusan B Morrow  ONG:[id number]:[id number] DOB: Aug 25, 1950 DOA: 10/07/2020 PCP: Julie Morrow, No Pcp Per (Inactive)    Chief Complaint  Julie Morrow presents with  . Failure To Thrive    Brief Narrative:  This is a69 y.o.femalewith a past medical history of bipolar 1 disorder, type 2 diabetes, hypothyroidism, hypertension, hyperlipidemia who [facility] ED on [facility]3 with severe depression and suicidal ideation, She was evaluated by psychiatry and deemed appropriate for inpatient psychiatric treatment and was [facility]  While hospitalized she had multiple unwitnessed fallsand seemed to have some confusion overnight between 5/11-5/12 and [facility]. Imaging and work-up at the time was unremarkable for fracture and noted to be alert and oriented and was discharged on 5/12 [facility]. On 5/13 she was evaluated by OT who deemed the Julie Morrow not appropriate to return home and live independently due to safety risk, cognitive impairments and inability to safely engage in ADLs and was recommended to discharge to SNF. She was started on Macrobid for a UTI on 5/15. She has had not had much improvement since being treated for UTI. Of note, she was placed on Xarelto several days ago for DVT prophylaxis.  the Julie Morrow was relatively independent in terms of ADLs prior to arrival to [facility] but [facility] she has had a rapid and progressive decline in functionality despite decreasing her risperidone. She also notes that her Depakote has been increased over the past week in order to return to her PTA dosage. Dr. Mason JimSingleton also reported sacral pressure ulcers. On her most recent OT assessment yesterday, she required maximum cues and was noted that she had slurred speech, and impaired insight. Physical therapy has also noted that she now requires mod 2/max assist.  [facility] behavioral health [facility] due to failure to thrive/confusion  Subjective:  Poor oral intake, only consumed 25% of the meal per documentation  She is awake /alert but speech is still slurred  no agitation, following commands   Assessment & Plan:   Principal Problem:   Failure to thrive in adult Active Problems:   Diabetes type 2, controlled (HCC)   Hypothyroidism   Bipolar 1 disorder, mixed, severe (HCC)   Hyperlipidemia   Acute encephalopathy   FTT (failure to thrive) in adult    FTT/rapid decline in functional status/acute encephalopathy of unclear etiology: Per friends Julie Morrow started to show decline in April, but she was able to walk in a slow pace from the parking lot to the store, then use a scooter in the store while shopping in April. CT of the head no acute findings She has no leukocytosis, no fever She has mild ammonia elevation at 43, valproic level at 38 Appear improving as respiratory and Depakote dose coming down Generalized weakness, PT recommends skilled nursing facility placement Dysphagia, speech recommended dysphagia diet/aspiration precaution Poor oral intake, continue  hydration  Bipolar disorder with severe depression and suicidal ideation Management per psychiatry  Noninsulin-dependent type 2 diabetes A1c 6.5 A.m. fasting blood glucose 93 Currently not on any meds, home medication GLP-1 agonist held Continue carb modified diet  Hyperlipidemia continue Crestor  Hypothyroidism Continue Synthroid supplement TSH 0.4  Class III obesity The Julie Morrow's BMI is: Body mass index is 44.15 kg/m.Marland Kitchen.   odorless, whitish, cottage cheese looking discharge when performing  perineal care Julie Morrow denies pain, or itch No rash check urine GC  chlamydia, UA Vaginal swab as well    Unresulted Labs (From admission, onward)          Start     Ordered   10/08/20 0500  Basic metabolic panel  Daily,   R      10/07/20 1511   10/08/20 0500  CBC  Daily,   R      10/07/20  1511   Signed and Held  Urinalysis, Routine w reflex microscopic  Once,   R        Signed and Held            DVT prophylaxis: enoxaparin (LOVENOX) injection 40 mg Start: 10/07/20 1800   Code Status: Full Family Communication: sister over the phone on 5/26, friends at bedside with permission on 5/27 Disposition:   Status is: Inpatient   Dispo: The Julie Morrow is from: Behavioral health              Anticipated d/c is to: Need skilled nursing facility placement once cleared by psychiatry              Anticipated d/c date is: To be determined                Consultants:   Psychiatry  Procedures:   None  Antimicrobials:    Anti-infectives (From admission, onward)   None         Objective: Vitals:   10/10/20 1323 10/10/20 2117 10/11/20 0455 10/11/20 1338  BP: (!) 109/93 (!) 160/79 (!) 113/58 134/77  Pulse: 64 60 (!) 52 62  Resp: (!) 22 18 20 18   Temp: 98.5 F (36.9 C) 97.8 F (36.6 C) 98 F (36.7 C) 98 F (36.7 C)  TempSrc:   Oral Oral  SpO2: 97% 95% 99% 96%  Weight:      Height:        Intake/Output Summary (Last 24 hours) at 10/11/2020 1403 Last data filed at 10/11/2020 1015 Gross per 24 hour  Intake 118 ml  Output 300 ml  Net -182 ml   Filed Weights   10/07/20 1310  Weight: 106 kg    Examination:  General exam: calm, NAD, awake and alert, speech is  slurred and  delayed , follow command Respiratory system: Clear to auscultation. Respiratory effort normal. Cardiovascular system: S1 & S2 heard, RRR. No JVD, no murmur, No pedal edema. Gastrointestinal system: Abdomen is nondistended, soft and nontender. Normal bowel sounds heard. Central nervous system: Alert and oriented. No focal neurological deficits. Extremities: generalized weakness, appear able to  lift both legs against gravity slightly higher but still less than 30degress  Skin: No rashes, lesions or ulcers Psychiatry: flat affect , no agitation, speech is slurred and delayed, following  commands     Data Reviewed: I have personally reviewed following labs and imaging studies  CBC: Recent Labs  Lab 10/06/20 0721 10/07/20 1329 10/08/20 0526 10/09/20 0503 10/10/20 0556 10/11/20 0529  WBC 7.0 7.4 5.9 7.7 7.8 7.1  NEUTROABS 4.2 4.5  --   --   --   --   HGB 13.7 14.4 13.0 13.9 14.4 13.5  HCT 42.2 44.8 39.2 42.9 43.5 41.9  MCV 95.9 96.3 94.5 95.8 95.4 95.0  PLT 167 205 169 176 184 192    Basic Metabolic Panel: Recent Labs  Lab 10/07/20 1329 10/08/20 0526 10/09/20 0503 10/10/20 0556 10/11/20 0529  NA 138 140 138 138 139  K 4.7 3.9 3.8 3.5 4.2  CL 98 103 101 99 102  CO2  30 30 30 30 26   GLUCOSE 121* 93 99 109* 101*  BUN 15 11 13 16 17   CREATININE 1.01* 0.79 0.78 0.96 0.89  CALCIUM 9.7 9.1 9.5 10.1 9.5    GFR: Estimated Creatinine Clearance: 67 mL/min (by C-G formula based on SCr of 0.89 mg/dL).  Liver Function Tests: Recent Labs  Lab 10/07/20 1329  AST 19  ALT 14  ALKPHOS 52  BILITOT 0.4  PROT 7.3  ALBUMIN 3.9    CBG: Recent Labs  Lab 10/05/20 1148 10/05/20 1659 10/05/20 1751 10/06/20 0536 10/07/20 0630  GLUCAP 90 99 99 96 110*     Recent Results (from the past 240 hour(s))  Resp Panel by RT-PCR (Flu A&B, Covid) Nasopharyngeal Swab     Status: None   Collection Time: 10/07/20  2:36 PM   Specimen: Nasopharyngeal Swab; Nasopharyngeal(NP) swabs in vial transport medium  Result Value Ref Range Status   SARS Coronavirus 2 by RT PCR NEGATIVE NEGATIVE Final    Comment: (NOTE) SARS-CoV-2 target nucleic acids are NOT DETECTED.  The SARS-CoV-2 RNA is generally detectable in upper respiratory specimens during the acute phase of infection. The lowest concentration of SARS-CoV-2 viral copies this assay can detect is 138 copies/mL. A negative result does not preclude SARS-Cov-2 infection and should not be used as the sole basis for treatment or other Julie Morrow management decisions. A negative result may occur with  improper specimen  collection/handling, submission of specimen other than nasopharyngeal swab, presence of viral mutation(s) within the areas targeted by this assay, and inadequate number of viral copies(<138 copies/mL). A negative result must be combined with clinical observations, Julie Morrow history, and epidemiological information. The expected result is Negative.  Fact Sheet for Patients:  10/09/20  Fact Sheet for Healthcare Providers:  10/09/20  This test is no t yet approved or cleared by the [employer] FDA and  has been authorized for detection and/or diagnosis of SARS-CoV-2 by FDA under an Emergency Use Authorization (EUA). This EUA will remain  in effect (meaning this test can be used) for the duration of the COVID-19 declaration under Section 564(b)(1) of the Act, 21 U.S.C.section 360bbb-3(b)(1), unless the authorization is terminated  or revoked sooner.       Influenza A by PCR NEGATIVE NEGATIVE Final   Influenza B by PCR NEGATIVE NEGATIVE Final    Comment: (NOTE) The Xpert Xpress SARS-CoV-2/FLU/RSV plus assay is intended as an aid in the diagnosis of influenza from Nasopharyngeal swab specimens and should not be used as a sole basis for treatment. Nasal washings and aspirates are unacceptable for Xpert Xpress SARS-CoV-2/FLU/RSV testing.  Fact Sheet for Patients: SeriousBroker.it  Fact Sheet for Healthcare Providers: [place]  This test is not yet approved or cleared by the [employer] FDA and has been authorized for detection and/or diagnosis of SARS-CoV-2 by FDA under an Emergency Use Authorization (EUA). This EUA will remain in effect (meaning this test can be used) for the duration of the COVID-19 declaration under Section 564(b)(1) of the Act, 21 U.S.C. section 360bbb-3(b)(1), unless the authorization is terminated or revoked.  Performed at [facility], [address], [place], [place]          Radiology Studies: No results found.      Scheduled Meds: . aspirin  81 mg Oral Daily  . benztropine  0.5 mg Oral BID  . buPROPion  300 mg Oral Daily  . divalproex  750 mg Oral QHS  . enoxaparin (LOVENOX) injection  40  mg Subcutaneous Q24H  . levothyroxine  150 mcg Oral Daily  . multivitamin  15 mL Oral Daily  . Ensure Max Protein  11 oz Oral BID  . risperiDONE  2 mg Oral QHS  . rosuvastatin  40 mg Oral Daily  . senna-docusate  1 tablet Oral QHS  . sodium chloride flush  3 mL Intravenous Q12H   Continuous Infusions: . sodium chloride 75 mL/hr at 10/11/20 0852     LOS: 3 days   Time spent: 25 mins Greater than 50% of this time was spent in counseling, explanation of diagnosis, planning of further management, and coordination of care.   Voice Recognition Reubin [place] dictation system was used to create this note, attempts have been made to correct errors. Please contact the author with questions and/or clarifications.   Albertine Grates, MD PhD FACP [facility]  Available via Epic secure chat 7am-7pm for nonurgent issues Please page for urgent issues To page the attending provider between 7A-7P or the covering provider during after hours 7P-7A, please log into the web site [email or web address] and access using universal [place] password for that web site. If you do not have the password, please call the hospital operator.    10/11/2020, 2:03 PM

## 2020-10-11 NOTE — Progress Notes (Signed)
Pt had bed bath, incontinent of urine, foul odor, amber color. Moisture associated changes to bottom with open area discovered on coccyx. Pt cleansed, pat dried, foam in place to coccyx. Bilateral breasts, red, raw; cleansed and nystatin powder applied to all folds. MD made aware.

## 2020-10-12 DIAGNOSIS — R627 Adult failure to thrive: Secondary | ICD-10-CM | POA: Diagnosis not present

## 2020-10-12 LAB — CBC WITH DIFFERENTIAL/PLATELET
Abs Immature Granulocytes: 0.01 10*3/uL (ref 0.00–0.07)
Basophils Absolute: 0 10*3/uL (ref 0.0–0.1)
Basophils Relative: 1 %
Eosinophils Absolute: 0.2 10*3/uL (ref 0.0–0.5)
Eosinophils Relative: 2 %
HCT: 43.9 % (ref 36.0–46.0)
Hemoglobin: 14.2 g/dL (ref 12.0–15.0)
Immature Granulocytes: 0 %
Lymphocytes Relative: 26 %
Lymphs Abs: 1.9 10*3/uL (ref 0.7–4.0)
MCH: 31.4 pg (ref 26.0–34.0)
MCHC: 32.3 g/dL (ref 30.0–36.0)
MCV: 97.1 fL (ref 80.0–100.0)
Monocytes Absolute: 0.8 10*3/uL (ref 0.1–1.0)
Monocytes Relative: 10 %
Neutro Abs: 4.4 10*3/uL (ref 1.7–7.7)
Neutrophils Relative %: 61 %
Platelets: 188 10*3/uL (ref 150–400)
RBC: 4.52 MIL/uL (ref 3.87–5.11)
RDW: 13.8 % (ref 11.5–15.5)
WBC: 7.2 10*3/uL (ref 4.0–10.5)
nRBC: 0 % (ref 0.0–0.2)

## 2020-10-12 LAB — COMPREHENSIVE METABOLIC PANEL
ALT: 12 U/L (ref 0–44)
AST: 14 U/L — ABNORMAL LOW (ref 15–41)
Albumin: 3.5 g/dL (ref 3.5–5.0)
Alkaline Phosphatase: 51 U/L (ref 38–126)
Anion gap: 6 (ref 5–15)
BUN: 21 mg/dL (ref 8–23)
CO2: 29 mmol/L (ref 22–32)
Calcium: 9.4 mg/dL (ref 8.9–10.3)
Chloride: 106 mmol/L (ref 98–111)
Creatinine, Ser: 0.97 mg/dL (ref 0.44–1.00)
GFR, Estimated: >60 mL/min (ref >60)
Glucose, Bld: 100 mg/dL — ABNORMAL HIGH (ref 70–99)
Potassium: 3.8 mmol/L (ref 3.5–5.1)
Sodium: 141 mmol/L (ref 135–145)
Total Bilirubin: 0.4 mg/dL (ref 0.3–1.2)
Total Protein: 6.6 g/dL (ref 6.5–8.1)

## 2020-10-12 LAB — MAGNESIUM: Magnesium: 2 mg/dL (ref 1.7–2.4)

## 2020-10-12 LAB — AMMONIA: Ammonia: 24 umol/L (ref 9–35)

## 2020-10-12 NOTE — Progress Notes (Signed)
This writer found bruises and redness to the pt's inner labia and clitoral areas. Pt complained of discomfort during perineal care. MD aware and evaluated pt. Charge nurse aware.

## 2020-10-12 NOTE — Progress Notes (Signed)
PROGRESS NOTE    Julie Morrow  GEZ:[id number] DOB: 1950/07/25 DOA: 10/07/2020 PCP: Patient, No Pcp Per (Inactive)    Chief Complaint  Patient presents with  . Failure To Thrive    Brief Narrative:  This is a69 y.o.femalewith a past medical history of bipolar 1 disorder, type 2 diabetes, hypothyroidism, hypertension, hyperlipidemia who initially presented to [facility] ED on 5/3 with severe depression and suicidal ideation, She was evaluated by psychiatry and deemed appropriate for inpatient psychiatric treatment and was sent from [facility] ED to [facility] on 09/19/2020  While hospitalized at [facility] she had multiple unwitnessed fallsand seemed to have some confusion overnight between 5/11-5/12 and was transported to the [facility] ED for evaluation. Imaging and work-up at the time was unremarkable for fracture and noted to be alert and oriented and was discharged on 5/12 back to [facility]. On 5/13 she was evaluated by OT who deemed the patient not appropriate to return home and live independently due to safety risk, cognitive impairments and inability to safely engage in ADLs and was recommended to discharge to SNF. She was started on Macrobid for a UTI on 5/15. She has had not had much improvement since being treated for UTI.  On her most recent OT assessment yesterday, she required maximum cues and was noted that she had slurred speech, and impaired insight. Physical therapy has also noted that she now requires mod 2/max assist.   She is transferred from behavioral health to [facility] long hospital service due to failure to thrive/confusion, Ct head 5/24 no acute findings Though psych meds related, dose adjusted, psych think now she is on appropriate dose and signed off She needs snf placement  Subjective:   oral intake seems has improved some, she consumed 50% of the meal today per documentation  She is awake /alert but speech is still slurred but speed of speech seems improved, she remains very weak, but able to  lift legs against gravity at 15degrees which is an improvement  no agitation, following commands She denies pain, no fever   Assessment & Plan:   Principal Problem:   Failure to thrive in adult Active Problems:   Diabetes type 2, controlled (HCC)   Hypothyroidism   Bipolar 1 disorder, mixed, severe (HCC)   Hyperlipidemia   Acute encephalopathy   FTT (failure to thrive) in adult    FTT/rapid decline in functional status/acute encephalopathy of unclear etiology: -Per friends patient started to show decline in April, but she was able to walk in a slow pace from the parking lot to the store, then use a scooter in the store while shopping in April. -CT of the head no acute findings on admission -She has no leukocytosis, no fever -She has mild ammonia elevation at 43, valproic level at 38 initially, repeat ammonia is 24 today on 5/29 -Appear improving as respirdol and Depakote dose coming down   Dysphagia, speech recommended dysphagia diet/aspiration precaution Poor oral intake, received  Hydration, oral intake seem has improved, hold hydration, monitor  Bipolar disorder with severe depression and suicidal ideation Psych has signed off , think she is on a good regimen currently  Noninsulin-dependent type 2 diabetes A1c 6.5 A.m. fasting blood glucose 100 Currently not on any meds, home medication GLP-1 agonist held Continue carb modified diet  Hyperlipidemia continue Crestor  Hypothyroidism Continue Synthroid supplement TSH 0.4  Class III obesity The patient's BMI is: Body mass index is 44.15 kg/m.Marland Kitchen    odorless, whitish, cottage cheese looking discharge when performing  perineal care Patient denies pain, or itch No rash check urine GC chlamydia, UA Vaginal swab as well  Labia skin irritation I do not see bruising  From puriwick irritation, she does not seem to have significant pain, no blisters, will check for HSV  Addendum: UA with large leukocyte, positive  nitrite, rare bacteria, urine culture in process, she has no leukocytosis, no fever Hold of on abx, f/u urine culture    Unresulted Labs (From admission, onward)          Start     Ordered   10/13/20 0500  Herpes simplex virus (HSV), DNA by PCR Blood  Tomorrow morning,   R        10/12/20 1519   10/13/20 0500  Basic metabolic panel  Tomorrow morning,   R       Question:  Specimen collection method  Answer:  Lab=Lab collect   10/12/20 1535   10/12/20 1517  Herpes simplex virus (HSV), DNA by PCR Sterile Swab  Once,   R       Comments: Labia swab   Question:  NICU - indicate swab sites:  Answer:    Comment:  vaginal   10/12/20 1518   10/11/20 1405  Culture, Urine  Once,   R        10/11/20 1405   10/08/20 0500  CBC  Daily,   R      10/07/20 1511   Signed and Held  Urinalysis, Routine w reflex microscopic  Once,   R        Signed and Held            DVT prophylaxis: enoxaparin (LOVENOX) injection 40 mg Start: 10/07/20 1800   Code Status: Full Family Communication: sister over the phone on 5/26, friends at bedside with permission on 5/27 Disposition:   Status is: Inpatient   Dispo: The patient is from: Behavioral health              Anticipated d/c is to: Need skilled nursing facility placement              Anticipated d/c date is: awaiting urine culture/urine gc/chlamydia result, clinically slowly improving, likely will be medically ready to discharge home once culture result come back                Consultants:   Psychiatry  Procedures:   None  Antimicrobials:    Anti-infectives (From admission, onward)   None         Objective: Vitals:   10/11/20 1338 10/11/20 2054 10/12/20 0539 10/12/20 1346  BP: 134/77 (!) 159/81 (!) 156/79 137/75  Pulse: 62 78 60 66  Resp: 18 16 17 16   Temp: 98 F (36.7 C) 97.7 F (36.5 C) 98 F (36.7 C) 98.2 F (36.8 C)  TempSrc: Oral Oral Oral   SpO2: 96% 97% 94% 97%  Weight:      Height:        Intake/Output  Summary (Last 24 hours) at 10/12/2020 1535 Last data filed at 10/12/2020 1312 Gross per 24 hour  Intake 100 ml  Output 700 ml  Net -600 ml   Filed Weights   10/07/20 1310  Weight: 106 kg    Examination:  General exam: calm, NAD, awake and alert, speech is  slurred , less delayed , follow command Respiratory system: Clear to auscultation. Respiratory effort normal. Cardiovascular system: S1 & S2 heard, RRR. No JVD, no murmur, No pedal edema. Gastrointestinal system: Abdomen is  nondistended, soft and nontender. Normal bowel sounds heard. Central nervous system: Alert and oriented. No focal neurological deficits. Extremities: generalized weakness, appear able to  lift both legs against gravity slightly higher but still less than 30degress  Skin: No rashes, lesions or ulcers Psychiatry: flat affect , no agitation, speech is slurred , less delayed, following commands        Data Reviewed: I have personally reviewed following labs and imaging studies  CBC: Recent Labs  Lab 10/06/20 0721 10/07/20 1329 10/08/20 0526 10/09/20 0503 10/10/20 0556 10/11/20 0529 10/12/20 0533  WBC 7.0 7.4 5.9 7.7 7.8 7.1 7.2  NEUTROABS 4.2 4.5  --   --   --   --  4.4  HGB 13.7 14.4 13.0 13.9 14.4 13.5 14.2  HCT 42.2 44.8 39.2 42.9 43.5 41.9 43.9  MCV 95.9 96.3 94.5 95.8 95.4 95.0 97.1  PLT 167 205 169 176 184 192 188    Basic Metabolic Panel: Recent Labs  Lab 10/08/20 0526 10/09/20 0503 10/10/20 0556 10/11/20 0529 10/12/20 0533  NA 140 138 138 139 141  K 3.9 3.8 3.5 4.2 3.8  CL 103 101 99 102 106  CO2 30 30 30 26 29   GLUCOSE 93 99 109* 101* 100*  BUN 11 13 16 17 21   CREATININE 0.79 0.78 0.96 0.89 0.97  CALCIUM 9.1 9.5 10.1 9.5 9.4  MG  --   --   --   --  2.0    GFR: Estimated Creatinine Clearance: 61.4 mL/min (by C-G formula based on SCr of 0.97 mg/dL).  Liver Function Tests: Recent Labs  Lab 10/07/20 1329 10/12/20 0533  AST 19 14*  ALT 14 12  ALKPHOS 52 51  BILITOT 0.4  0.4  PROT 7.3 6.6  ALBUMIN 3.9 3.5    CBG: Recent Labs  Lab 10/05/20 1659 10/05/20 1751 10/06/20 0536 10/07/20 0630  GLUCAP 99 99 96 110*     Recent Results (from the past 240 hour(s))  Resp Panel by RT-PCR (Flu A&B, Covid) Nasopharyngeal Swab     Status: None   Collection Time: 10/07/20  2:36 PM   Specimen: Nasopharyngeal Swab; Nasopharyngeal(NP) swabs in vial transport medium  Result Value Ref Range Status   SARS Coronavirus 2 by RT PCR NEGATIVE NEGATIVE Final    Comment: (NOTE) SARS-CoV-2 target nucleic acids are NOT DETECTED.  The SARS-CoV-2 RNA is generally detectable in upper respiratory specimens during the acute phase of infection. The lowest concentration of SARS-CoV-2 viral copies this assay can detect is 138 copies/mL. A negative result does not preclude SARS-Cov-2 infection and should not be used as the sole basis for treatment or other patient management decisions. A negative result may occur with  improper specimen collection/handling, submission of specimen other than nasopharyngeal swab, presence of viral mutation(s) within the areas targeted by this assay, and inadequate number of viral copies(<138 copies/mL). A negative result must be combined with clinical observations, patient history, and epidemiological information. The expected result is Negative.  Fact Sheet for Patients:  10/09/20  Fact Sheet for Healthcare Providers:  10/09/20  This test is no t yet approved or cleared by the [employer].[place] FDA and  has been authorized for detection and/or diagnosis of SARS-CoV-2 by FDA under an Emergency Use Authorization (EUA). This EUA will remain  in effect (meaning this test can be used) for the duration of the COVID-19 declaration under Section 564(b)(1) of the Act, 21 U.S.C.section 360bbb-3(b)(1), unless the authorization is terminated  or revoked sooner.  Influenza A by PCR  NEGATIVE NEGATIVE Final   Influenza B by PCR NEGATIVE NEGATIVE Final    Comment: (NOTE) The Xpert Xpress SARS-CoV-2/FLU/RSV plus assay is intended as an aid in the diagnosis of influenza from Nasopharyngeal swab specimens and should not be used as a sole basis for treatment. Nasal washings and aspirates are unacceptable for Xpert Xpress SARS-CoV-2/FLU/RSV testing.  Fact Sheet for Patients: BloggerCourse.comhttps://www.fda.gov/media/152166/download  Fact Sheet for Healthcare Providers: SeriousBroker.ithttps://www.fda.gov/media/152162/download  This test is not yet approved or cleared by the [place] FDA and has been authorized for detection and/or diagnosis of SARS-CoV-2 by FDA under an Emergency Use Authorization (EUA). This EUA will remain in effect (meaning this test can be used) for the duration of the COVID-19 declaration under Section 564(b)(1) of the Act, 21 U.S.C. section 360bbb-3(b)(1), unless the authorization is terminated or revoked.  Performed at [facility], [address] [place], [place] [id number]          Radiology Studies: No results found.      Scheduled Meds: . aspirin  81 mg Oral Daily  . benztropine  0.5 mg Oral BID  . buPROPion  300 mg Oral Daily  . divalproex  750 mg Oral QHS  . enoxaparin (LOVENOX) injection  40 mg Subcutaneous Q24H  . levothyroxine  150 mcg Oral Daily  . multivitamin  15 mL Oral Daily  . nystatin   Topical BID  . polyethylene glycol  17 g Oral Daily  . Ensure Max Protein  11 oz Oral BID  . risperiDONE  2 mg Oral QHS  . rosuvastatin  40 mg Oral Daily  . senna-docusate  1 tablet Oral QHS  . sodium chloride flush  3 mL Intravenous Q12H   Continuous Infusions:    LOS: 4 days   Time spent: 25 mins Greater than 50% of this time was spent in counseling, explanation of diagnosis, planning of further management, and coordination of care.   Voice Recognition Reubin Milan/Dragon dictation system was used to create this note, attempts have  been made to correct errors. Please contact the author with questions and/or clarifications.   Albertine GratesFang Joliyah Lippens, MD PhD FACP [facility]  Available via Epic secure chat 7am-7pm for nonurgent issues Please page for urgent issues To page the attending provider between 7A-7P or the covering provider during after hours 7P-7A, please log into the web site [email or web address] and access using universal [place] password for that web site. If you do not have the password, please call the hospital operator.    10/12/2020, 3:35 PM

## 2020-10-13 DIAGNOSIS — R627 Adult failure to thrive: Secondary | ICD-10-CM | POA: Diagnosis not present

## 2020-10-13 LAB — BASIC METABOLIC PANEL
Anion gap: 8 (ref 5–15)
BUN: 21 mg/dL (ref 8–23)
CO2: 28 mmol/L (ref 22–32)
Calcium: 9.5 mg/dL (ref 8.9–10.3)
Chloride: 106 mmol/L (ref 98–111)
Creatinine, Ser: 0.99 mg/dL (ref 0.44–1.00)
GFR, Estimated: >60 mL/min (ref >60)
Glucose, Bld: 108 mg/dL — ABNORMAL HIGH (ref 70–99)
Potassium: 4.4 mmol/L (ref 3.5–5.1)
Sodium: 142 mmol/L (ref 135–145)

## 2020-10-13 MED ORDER — SODIUM CHLORIDE 0.9 % IV SOLN
1.0000 g | INTRAVENOUS | Status: DC
  Administered 2020-10-13: 1 g via INTRAVENOUS
  Filled 2020-10-13 (×2): qty 10

## 2020-10-13 MED ORDER — FLUCONAZOLE 150 MG PO TABS
150.0000 mg | ORAL_TABLET | Freq: Once | ORAL | Status: AC
  Administered 2020-10-13: 150 mg via ORAL
  Filled 2020-10-13: qty 1

## 2020-10-13 NOTE — Progress Notes (Signed)
PROGRESS NOTE    Julie Morrow  JTT:[id number] DOB: [birth year] DOA: 10/07/2020 PCP: Patient, No Pcp Per (Inactive)   Chief Complain: Failure to thrive  Brief Narrative: Patient is a 70 year old female with history of bipolar 1 disorder, diabetes type 2, hypothyroidism, hypertension, hyperlipidemia who presented initially to [facility] ED on 5/3 with severe depression, suicidal ideation. She was evaluated by psychiatry and deemed appropriate for inpatient psychiatric treatment and was sent from [facility] ED to [facility] on 09/19/2020.  While at [facility] she had multiple unwitnessed falls, developed confusion and was sent to [facility] emergency department for evaluation.  She was noted to be alert and oriented and was discharged on 5/12 back to [facility]. On 5/13 she was evaluated by OT who deemed the patient not appropriate to return home and live independently due to safety risk, cognitive impairments and inability to safely engage in ADLs and was recommended to discharge to SNF. She was transferred from [facility] to [facility] hospital due to failure of thrive/confusion.  Urine culture now showing gram-negative rods, started on ceftriaxone.  Waiting for placement.   Assessment & Plan:   Principal Problem:   Failure to thrive in adult Active Problems:   Diabetes type 2, controlled (HCC)   Hypothyroidism   Bipolar 1 disorder, mixed, severe (HCC)   Hyperlipidemia   Acute encephalopathy   FTT (failure to thrive) in adult   Failure to thrive: Continue supportive care.  PT/OT recommending skilled nursing facility on DC.  Altered mental status: Unclear etiology but could be from UTI.  CT head done on presentation did not show any acute intracranial finding.  Ammonia level not remarkable.  UTI: Urine cultures showing gram-negative rods.  Started on ceftriaxone.  Follow-up final urine culture report.  She was previously treated with Macrobid for UTI  Dysphagia: Speech therapy recommending dysphagia diet/aspiration  precaution.  Bipolar 1 disorder/severe depression/suicidal ideation: Patient was transferred from behavioral health to here.  Psychiatry was following.  Continue current psychiatric medications.  Non-insulin-dependent diabetes type 2: Hemoglobin C 6.5.  Currently not on any medications.  Continue to monitor blood sugars.  Hyperlipidemia: On Crestor  Hypothyroidism: On Synthyroid  Vaginal discharge: Denies any pain or itching.  Urine GC/chlamydia send,will follow up.  She has some redness and excoriation on the labia majora.  Cottage cheese discharge was observed.  Requested pharmacy consult for starting  on oral fluconazole.  Morbid  obesity: BMI of 44  Nutrition Problem: Inadequate oral intake Etiology: dysphagia      DVT prophylaxis:Lovenox Code Status: Full Family Communication: None at bedside Status is: Inpatient  Remains inpatient appropriate because:Inpatient level of care appropriate due to severity of illness   Dispo: The patient is from:behavioral health              Anticipated d/c is to: SNF              Patient currently is not medically stable to d/c.   Difficult to place patient No   Consultants: Psychiatry  Procedures:None  Antimicrobials:  Anti-infectives (From admission, onward)   Start     Dose/Rate Route Frequency Ordered Stop   10/13/20 1045  cefTRIAXone (ROCEPHIN) 1 g in sodium chloride 0.9 % 100 mL IVPB        1 g 200 mL/hr over 30 Minutes Intravenous Every 24 hours 10/13/20 0953        Subjective:  Patient seen and examined at bedside this morning.  Hemodynamically stable.  Overall comfortable, being cleaned when I entered  the room.  Oriented to place only.  Not agitated.  Denies any complaints.  Objective: Vitals:   10/12/20 0539 10/12/20 1346 10/12/20 2058 10/13/20 0405  BP: (!) 156/79 137/75 (!) 149/86 139/78  Pulse: 60 66 64 64  Resp: 17 16 (!) 21 15  Temp: 98 F (36.7 C) 98.2 F (36.8 C) 98.1 F (36.7 C) 97.8 F (36.6 C)   TempSrc: Oral  Oral Oral  SpO2: 94% 97% 97% 99%  Weight:      Height:        Intake/Output Summary (Last 24 hours) at 10/13/2020 [id number] Last data filed at 10/12/2020 1834 Gross per 24 hour  Intake 100 ml  Output 350 ml  Net -250 ml   Filed Weights   10/07/20 1310  Weight: 106 kg    Examination:  General exam: Appears calm and comfortable ,Not in distress,obese HEENT:PERRL,Oral mucosa moist, Ear/Nose normal on gross exam Respiratory system: Bilateral equal air entry, normal vesicular breath sounds, no wheezes or crackles  Cardiovascular system: S1 & S2 heard, RRR. No JVD, murmurs, rubs, gallops or clicks. No pedal edema. Gastrointestinal system: Abdomen is nondistended, soft and nontender. No organomegaly or masses felt. Normal bowel sounds heard. Central nervous system: Alert and awake, oriented to place only Extremities: No edema, no clubbing ,no cyanosis Skin: No rashes, lesions or ulcers,no icterus ,no pallor Psychiatry: Judgement and insight appear impaired  Data Reviewed: I have personally reviewed following labs and imaging studies  CBC: Recent Labs  Lab 10/07/20 1329 10/08/20 0526 10/09/20 0503 10/10/20 0556 10/11/20 0529 10/12/20 0533  WBC 7.4 5.9 7.7 7.8 7.1 7.2  NEUTROABS 4.5  --   --   --   --  4.4  HGB 14.4 13.0 13.9 14.4 13.5 14.2  HCT 44.8 39.2 42.9 43.5 41.9 43.9  MCV 96.3 94.5 95.8 95.4 95.0 97.1  PLT 205 169 176 184 192 188   Basic Metabolic Panel: Recent Labs  Lab 10/09/20 0503 10/10/20 0556 10/11/20 0529 10/12/20 0533 10/13/20 0705  NA 138 138 139 141 142  K 3.8 3.5 4.2 3.8 4.4  CL 101 99 102 106 106  CO2 30 30 26 29 28   GLUCOSE 99 109* 101* 100* 108*  BUN 13 16 17 21 21   CREATININE 0.78 0.96 0.89 0.97 0.99  CALCIUM 9.5 10.1 9.5 9.4 9.5  MG  --   --   --  2.0  --    GFR: Estimated Creatinine Clearance: 60.2 mL/min (by C-G formula based on SCr of 0.99 mg/dL). Liver Function Tests: Recent Labs  Lab 10/07/20 1329 10/12/20 0533   AST 19 14*  ALT 14 12  ALKPHOS 52 51  BILITOT 0.4 0.4  PROT 7.3 6.6  ALBUMIN 3.9 3.5   Recent Labs  Lab 10/07/20 1512  LIPASE 22   Recent Labs  Lab 10/07/20 1329 10/12/20 0533  AMMONIA 43* 24   Coagulation Profile: No results for input(s): INR, PROTIME in the last 168 hours. Cardiac Enzymes: No results for input(s): CKTOTAL, CKMB, CKMBINDEX, TROPONINI in the last 168 hours. BNP (last 3 results) No results for input(s): PROBNP in the last 8760 hours. HbA1C: No results for input(s): HGBA1C in the last 72 hours. CBG: Recent Labs  Lab 10/07/20 0630  GLUCAP 110*   Lipid Profile: No results for input(s): CHOL, HDL, LDLCALC, TRIG, CHOLHDL, LDLDIRECT in the last 72 hours. Thyroid Function Tests: No results for input(s): TSH, T4TOTAL, FREET4, T3FREE, THYROIDAB in the last 72 hours. Anemia Panel: No results for input(s):  VITAMINB12, FOLATE, FERRITIN, TIBC, IRON, RETICCTPCT in the last 72 hours. Sepsis Labs: No results for input(s): PROCALCITON, LATICACIDVEN in the last 168 hours.  Recent Results (from the past 240 hour(s))  Resp Panel by RT-PCR (Flu A&B, Covid) Nasopharyngeal Swab     Status: None   Collection Time: 10/07/20  2:36 PM   Specimen: Nasopharyngeal Swab; Nasopharyngeal(NP) swabs in vial transport medium  Result Value Ref Range Status   SARS Coronavirus 2 by RT PCR NEGATIVE NEGATIVE Final    Comment: (NOTE) SARS-CoV-2 target nucleic acids are NOT DETECTED.  The SARS-CoV-2 RNA is generally detectable in upper respiratory specimens during the acute phase of infection. The lowest concentration of SARS-CoV-2 viral copies this assay can detect is 138 copies/mL. A negative result does not preclude SARS-Cov-2 infection and should not be used as the sole basis for treatment or other patient management decisions. A negative result may occur with  improper specimen collection/handling, submission of specimen other than nasopharyngeal swab, presence of viral  mutation(s) within the areas targeted by this assay, and inadequate number of viral copies(<138 copies/mL). A negative result must be combined with clinical observations, patient history, and epidemiological information. The expected result is Negative.  Fact Sheet for Patients:  BloggerCourse.com  Fact Sheet for Healthcare Providers:  SeriousBroker.it  This test is no t yet approved or cleared by the [place] FDA and  has been authorized for detection and/or diagnosis of SARS-CoV-2 by FDA under an Emergency Use Authorization (EUA). This EUA will remain  in effect (meaning this test can be used) for the duration of the COVID-19 declaration under Section 564(b)(1) of the Act, 21 U.S.C.section 360bbb-3(b)(1), unless the authorization is terminated  or revoked sooner.       Influenza A by PCR NEGATIVE NEGATIVE Final   Influenza B by PCR NEGATIVE NEGATIVE Final    Comment: (NOTE) The Xpert Xpress SARS-CoV-2/FLU/RSV plus assay is intended as an aid in the diagnosis of influenza from Nasopharyngeal swab specimens and should not be used as a sole basis for treatment. Nasal washings and aspirates are unacceptable for Xpert Xpress SARS-CoV-2/FLU/RSV testing.  Fact Sheet for Patients: BloggerCourse.com  Fact Sheet for Healthcare Providers: SeriousBroker.it  This test is not yet approved or cleared by the [place] FDA and has been authorized for detection and/or diagnosis of SARS-CoV-2 by FDA under an Emergency Use Authorization (EUA). This EUA will remain in effect (meaning this test can be used) for the duration of the COVID-19 declaration under Section 564(b)(1) of the Act, 21 U.S.C. section 360bbb-3(b)(1), unless the authorization is terminated or revoked.  Performed at [facility], [address] [place], [place] [address]   Culture, Urine      Status: Abnormal (Preliminary result)   Collection Time: 10/11/20  2:05 PM   Specimen: Urine, Catheterized  Result Value Ref Range Status   Specimen Description   Final    URINE, CATHETERIZED Performed at [facility], [address] [place], [place] [address]    Special Requests   Final    NONE Performed at [facility], [address], [place], [place] [address]    Culture >=100,000 COLONIES/mL GRAM NEGATIVE RODS (A)  Final   Report Status PENDING  Incomplete         Radiology Studies: No results found.      Scheduled Meds: . aspirin  81 mg Oral Daily  . benztropine  0.5 mg Oral BID  . buPROPion  300 mg Oral  Daily  . divalproex  750 mg Oral QHS  . enoxaparin (LOVENOX) injection  40 mg Subcutaneous Q24H  . levothyroxine  150 mcg Oral Daily  . multivitamin  15 mL Oral Daily  . nystatin   Topical BID  . polyethylene glycol  17 g Oral Daily  . Ensure Max Protein  11 oz Oral BID  . risperiDONE  2 mg Oral QHS  . rosuvastatin  40 mg Oral Daily  . senna-docusate  1 tablet Oral QHS  . sodium chloride flush  3 mL Intravenous Q12H   Continuous Infusions: . cefTRIAXone (ROCEPHIN)  IV       LOS: 5 days    Time spent:35 mins. More than 50% of that time was spent in counseling and/or coordination of care.      Burnadette Pop, MD [facility] P5/30/2022, 9:54 AM

## 2020-10-13 NOTE — TOC Progression Note (Signed)
Transition of Care [facility]) - Progression Note    Patient Details  Name: Julie Morrow MRN: [id number] Date of Birth: May 29, 1950  Transition of Care [facility]) CM/SW Contact  Ida Rogue, [place] Phone Number: 10/13/2020, 10:43 AM  Clinical Narrative:   Bed search initiated on 5/27.  No bed offers.  Will likely need to call places to see if they are willing to consider, get feedback on barriers. TOC will continue to follow during the course of hospitalization.     Expected Discharge Plan: Skilled Nursing Facility Barriers to Discharge: SNF Pending bed offer  Expected Discharge Plan and Services Expected Discharge Plan: Skilled Nursing Facility   Discharge Planning Services: CM Consult Post Acute Care Choice: Skilled Nursing Facility Living arrangements for the past 2 months: Single Family Home                                       Social Determinants of Health (SDOH) Interventions    Readmission Risk Interventions No flowsheet data found.

## 2020-10-13 NOTE — Care Management Important Message (Signed)
Medicare IM given to the patient by Rya Rausch. 

## 2020-10-14 ENCOUNTER — Inpatient Hospital Stay (HOSPITAL_COMMUNITY): Payer: Medicare Other

## 2020-10-14 DIAGNOSIS — R627 Adult failure to thrive: Secondary | ICD-10-CM | POA: Diagnosis not present

## 2020-10-14 LAB — URINE CULTURE: Culture: >=100000 — AB

## 2020-10-14 MED ORDER — CEPHALEXIN 500 MG PO CAPS
500.0000 mg | ORAL_CAPSULE | Freq: Three times a day (TID) | ORAL | Status: AC
  Administered 2020-10-14 – 2020-10-17 (×12): 500 mg via ORAL
  Filled 2020-10-14 (×12): qty 1

## 2020-10-14 NOTE — Progress Notes (Signed)
Physical Therapy Treatment Patient Details Name: Julie Morrow MRN: [id number] DOB: Dec 09, 1950 Today's Date: 10/14/2020    History of Present Illness Pt is a 70 y/o female with PMHx of bipolar disorder, anxiety disorder, depression who presented to the [facility] ED 09/16/2020 and then admitted to [facility] on 09/20/2020. PT ordered/consulted to address and assess recent decline in functional mobility. Patient was modified independnet on 09/22/20 PT eval.  Then while patient was admitted to behavioral health Hospital, pt was assessed by hospitalist team for chief complaint of change in mental status, who advised that patient come to the hospital and be admitted for observation.    PT Comments    Assisted pt OOB to [facility] and amb was difficult General bed mobility comments: multiple repeat VC's to complete and stay on task.  required assist with upper body and increased time to complete due to c/o fatigue General transfer comment: 75% VC's on proper hand placement and turn completion.  assisted from elevated bed to [facility] was difficult due to pt's cognition/poor ability to follow directions. unsafe incomplete turns.  Unsteady General Gait Details: very unsteady gait with poor posture B hips and knees flex inability to achieve full upright.  Required assist to guide walker/direction.  Recliner following closely behind as pt continues to slowly "squat walk"   HIGH FALL RISK Pt will need ST Rehab at SNF to address her mobility.    Follow Up Recommendations  SNF     Equipment Recommendations       Recommendations for Other Services       Precautions / Restrictions Precautions Precautions: Fall    Mobility  Bed Mobility Overal bed mobility: Needs Assistance Bed Mobility: Supine to Sit     Supine to sit: Mod assist     General bed mobility comments: multiple repeat VC's to complete and stay on task.  required assist with upper body and increased time to complete due to c/o fatigue     Transfers Overall transfer level: Needs assistance Equipment used: Rolling walker (2 wheeled) Transfers: Sit to/from [employer] Sit to Stand: +2 safety/equipment;Mod assist Stand pivot transfers: +2 safety/equipment;Mod assist;Max assist       General transfer comment: 75% VC's on proper hand placement and turn completion.  assisted from elevated bed to [facility] was difficult due to pt's cognition/poor ability to follow directions. unsafe incomplete turns.  unsteady  Ambulation/Gait   Gait Distance (Feet): 3 Feet Assistive device: Rolling walker (2 wheeled) Gait Pattern/deviations: Step-to pattern;Trunk flexed;Decreased step length - left;Decreased step length - right Gait velocity: decreased   General Gait Details: very unsteady gait with poor posture B hips and knees flex inability to achieve full upright.  Required assist to guide walker/direction.  Recliner following closely behind as pt continues to slowly "squat walk"   HIGH FALL RISK   Stairs             Wheelchair Mobility    Modified Rankin (Stroke Patients Only)       Balance                                            Cognition Arousal/Alertness: Awake/alert Behavior During Therapy: WFL for tasks assessed/performed Overall Cognitive Status: No family/caregiver present to determine baseline cognitive functioning Area of Impairment: Attention;Memory;Following commands;Safety/judgement;Awareness;Problem solving;Orientation  Orientation Level: Disoriented to;Time   Memory: Decreased short-term memory Following Commands: Follows multi-step commands inconsistently;Follows one step commands with increased time Safety/Judgement: Decreased awareness of safety;Decreased awareness of deficits     General Comments: multimodal cues for activities, extra time to process, pt initiates only after cueing      Exercises      General Comments         Pertinent Vitals/Pain Pain Assessment: Faces Faces Pain Scale: Hurts a little bit Pain Location: general with mobility Pain Descriptors / Indicators: Grimacing Pain Intervention(s): Monitored during session    Home Living                      Prior Function            PT Goals (current goals can now be found in the care plan section) Progress towards PT goals: Progressing toward goals    Frequency    Min 2X/week      PT Plan Current plan remains appropriate    Co-evaluation              AM-PAC PT "6 Clicks" Mobility   Outcome Measure  Help needed turning from your back to your side while in a flat bed without using bedrails?: A Lot Help needed moving from lying on your back to sitting on the side of a flat bed without using bedrails?: A Lot Help needed moving to and from a bed to a chair (including a wheelchair)?: Total Help needed standing up from a chair using your arms (e.g., wheelchair or bedside chair)?: Total Help needed to walk in hospital room?: Total Help needed climbing 3-5 steps with a railing? : Total 6 Click Score: 8    End of Session Equipment Utilized During Treatment: Gait belt Activity Tolerance: Patient tolerated treatment well Patient left: in chair;with call bell/phone within reach;with chair alarm set Nurse Communication: Mobility status PT Visit Diagnosis: Difficulty in walking, not elsewhere classified (R26.2);Muscle weakness (generalized) (M62.81)     Time: 5993-5701 PT Time Calculation (min) (ACUTE ONLY): 25 min  Charges:  $Gait Training: 8-22 mins $Therapeutic Activity: 8-22 mins                     {Ladale Sherburn  PTA Acute  Rehabilitation Services Pager      [phone] Office      [phone]
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
## 2020-10-15 DIAGNOSIS — R627 Adult failure to thrive: Secondary | ICD-10-CM | POA: Diagnosis not present

## 2020-10-15 LAB — VITAMIN B12: Vitamin B-12: 1078 pg/mL — ABNORMAL HIGH (ref 180–914)

## 2020-10-15 LAB — RPR: RPR Ser Ql: NONREACTIVE
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
## 2020-10-16 DIAGNOSIS — R627 Adult failure to thrive: Secondary | ICD-10-CM | POA: Diagnosis not present

## 2020-10-16 NOTE — Progress Notes (Signed)
  Speech Language Pathology Treatment: Dysphagia  Patient Details Name: Julie Morrow MRN: [id number] DOB: June 20, 1950 Today's Date: 10/16/2020 Time: 1850-1900 SLP Time Calculation (min) (ACUTE ONLY): 10 min  Assessment / Plan / Recommendation Clinical Impression  Pt seen sitting up in chair today - leaning to the right.  Able to straighten with moderate cues for optimal positioning.  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 Delay in swallow continues per observation - suspect due to both cognitive and neuromuscular deficits.    No indication of aspiration with pudding, juice and Ensure intake.  She is demonstrating improved control with liquid consumption via straw but needs max cues for slow rate/small boluses.   Continue to benefit from full supervision due to her impulsivity in self feeding- lack of swallowing prior to placing more boluses into oral cavity causing a safety issue.  Informed NT to recommendation to observe laryngeal elevation to assure pt swallows prior to helping pt take more po. Pt with much improved mentation since prior session one week ago.  Given pt's continued dysarthria, oral dysphagia due to impaired lingual coordination, impulsivity recommend continue puree/thin diet.  Pt's adequacy of po intake with puree/thin diet is remarkable. No SLP follow up indicated, please reconsult if indicated.   HPI HPI: 70 year old female with history of diabetes, bipolar disorder comes in with chief complaint of change in mental status.  Patient was [facility] to behavioral health hospital and was sent to hospital   Pt has been on a puree/thin diet since evalaution due to her oral dysphagia due to lingual weakness. SLP follow up to assure tolerance, assess for dietary advancement readiness/appropriateness.  Marland Kitchen      SLP Plan  All goals met       Recommendations  Diet recommendations: Dysphagia 1 (puree);Thin  liquid Liquids provided via: Cup;Straw Medication Administration: Whole meds with puree Compensations: Slow rate;Small sips/bites;Other (Comment) (assure pt swallows before giving more po) Postural Changes and/or Swallow Maneuvers: Seated upright 90 degrees;Upright 30-60 min after meal                Oral Care Recommendations: Oral care BID Follow up Recommendations: None SLP Visit Diagnosis: Dysphagia, oral phase (R13.11) Plan: All goals met       GO                Macario Golds 10/16/2020, 8:44 PM  Kathleen Lime, MS [facility] SLP Acute Rehab Services Office [phone] Pager [phone]
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
## 2020-10-16 NOTE — Progress Notes (Signed)
Nutrition Follow-up  DOCUMENTATION CODES:   Morbid obesity  INTERVENTION:   -Ensure MAX Protein po daily, each supplement provides 150 kcal and 30 grams of protein  -Liquid MVI daily  NUTRITION DIAGNOSIS:   Inadequate oral intake related to dysphagia as evidenced by per patient/family report.  Improving.  GOAL:   Patient will meet greater than or equal to 90% of their needs  Progressing.  MONITOR:   PO intake,Supplement acceptance,Labs,Weight trends,I & O's  ASSESSMENT:   70 y.o. female with a past medical history of bipolar 1 disorder, type 2 diabetes, hypothyroidism, hypertension, hyperlipidemia who initially presented to [facility] ED on 5/3 with severe depression and suicidal ideation.  Patient currently consuming 90-100% of meals. Drinking Ensure Max supplements. Per MD note, pt is medically stable for discharge.  Admission weight: 233 lbs. No other weights this admission.  Medications: Liquid MVI, Miralax, Senokot  Labs reviewed.  Diet Order:   Diet Order            DIET - DYS 1 Room service appropriate? Yes; Fluid consistency: Thin  Diet effective now                 EDUCATION NEEDS:   No education needs have been identified at this time  Skin:  Skin Assessment: Skin Integrity Issues: Skin Integrity Issues:: Other (Comment) Other: MASD mid sacrum  Last BM:  6/1 -type 6  Height:   Ht Readings from Last 1 Encounters:  10/07/20 5\' 1"  (1.549 m)    Weight:   Wt Readings from Last 1 Encounters:  10/07/20 106 kg    BMI:  Body mass index is 44.15 kg/m.  Estimated Nutritional Needs:   Kcal:  1600-1800  Protein:  70-85g  Fluid:  1.8L/day  10/09/20, MS, RD, LDN Inpatient Clinical Dietitian Contact information available via Amion

## 2020-10-17 NOTE — Progress Notes (Signed)
Physical Therapy Treatment Patient Details Name: Julie Morrow MRN: [id number] DOB: 1950-10-12 Today's Date: 10/17/2020    History of Present Illness Pt is a 70 y/o female with PMHx of bipolar disorder, anxiety disorder, depression who presented to the [facility] ED 09/16/2020 and then admitted to [facility] on 09/20/2020. PT ordered/consulted to address and assess recent decline in functional mobility. Patient was modified independnet on 09/22/20 PT eval.  Then while patient was admitted to behavioral health Hospital, pt was assessed by hospitalist team for chief complaint of change in mental status, who advised that patient come to the hospital and be admitted for observation.    PT Comments    Pt remains confused for most part.  She does follow repeat simple commands and express basic needs.  Present with delayed motor control and inability to functionally amb. General bed mobility comments: multiple repeat VC's to complete and stay on task.  required assist with upper body.  ABD girth appears even larger.  Pt unable to sit EOB on her own.  Max posterior LOB. General transfer comment: required increased assist this session.  Very difficult for pt to perform sit to stand due to enlarge ABD and unsafe/incomplete turns due to cognition as pt tends to sit to early.  Assisted from bed to [facility] then to reclliner was very difficult.  Rec MAXI MOVE. General Gait Details: pt was unable to support her own body weight during transfer.  Unable to attempt gait.   Follow Up Recommendations  SNF     Equipment Recommendations  None recommended by PT    Recommendations for Other Services       Precautions / Restrictions Precautions Precautions: Fall Restrictions Weight Bearing Restrictions: No    Mobility  Bed Mobility Overal bed mobility: Needs Assistance Bed Mobility: Supine to Sit     Supine to sit: Total assist;+2 for physical assistance;+2 for safety/equipment     General bed mobility comments: multiple  repeat VC's to complete and stay on task.  required assist with upper body.  ABD girth appears even larger.  Pt unable to sit EOB on her own.  Max posterior LOB.    Transfers Overall transfer level: Needs assistance Equipment used: Rolling walker (2 wheeled) Transfers: Sit to/from [employer] Sit to Stand: Total assist;+2 physical assistance;+2 safety/equipment Stand pivot transfers: Total assist;+2 physical assistance;+2 safety/equipment       General transfer comment: required increased assist this session.  Very difficult for pt to perform sit to stand due to enlarge ABD and unsafe/incomplete turns due to cognition as pt tends to sit to early.  Assisted from bed to [facility] then to reclliner was very difficult.  Rec MAXI MOVE.  Ambulation/Gait             General Gait Details: pt was unable to support her own body weight during transfer.  Unable to attempt gait.   Stairs             Wheelchair Mobility    Modified Rankin (Stroke Patients Only)       Balance                                            Cognition Arousal/Alertness: Awake/alert Behavior During Therapy:  (confused) Overall Cognitive Status: No family/caregiver present to determine baseline cognitive functioning Area of Impairment: Attention;Memory;Following commands;Safety/judgement;Awareness;Problem solving;Orientation  Orientation Level: Disoriented to;Person;Place;Time;Situation     Following Commands: Follows one step commands inconsistently Safety/Judgement: Decreased awareness of safety;Decreased awareness of deficits   Problem Solving: Slow processing;Difficulty sequencing;Requires verbal cues General Comments: repeat VC's with poor cognition      Exercises      General Comments        Pertinent Vitals/Pain Pain Assessment: Faces Faces Pain Scale: Hurts a little bit Pain Location: general Pain Descriptors / Indicators:  Grimacing Pain Intervention(s): Monitored during session    Home Living                      Prior Function            PT Goals (current goals can now be found in the care plan section) Progress towards PT goals: Progressing toward goals    Frequency    Min 2X/week      PT Plan Current plan remains appropriate    Co-evaluation              AM-PAC PT "6 Clicks" Mobility   Outcome Measure  Help needed turning from your back to your side while in a flat bed without using bedrails?: Total Help needed moving from lying on your back to sitting on the side of a flat bed without using bedrails?: Total Help needed moving to and from a bed to a chair (including a wheelchair)?: Total Help needed standing up from a chair using your arms (e.g., wheelchair or bedside chair)?: Total Help needed to walk in hospital room?: Total Help needed climbing 3-5 steps with a railing? : Total 6 Click Score: 6    End of Session   Activity Tolerance: Patient limited by fatigue;Other (comment) (profoundly weak and confused) Patient left: in chair;with call bell/phone within reach;with chair alarm set Nurse Communication: Mobility status;Need for lift equipment PT Visit Diagnosis: Difficulty in walking, not elsewhere classified (R26.2);Muscle weakness (generalized) (M62.81)     Time: 1140-1210 PT Time Calculation (min) (ACUTE ONLY): 30 min  Charges:  $Therapeutic Activity: 23-37 mins                     Felecia Shelling  PTA Acute  Rehabilitation Services Pager      [phone] Office      [phone]
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
## 2020-10-18 NOTE — Plan of Care (Signed)

## 2020-10-18 NOTE — Plan of Care (Signed)
  Problem: Pain Managment: Goal: General experience of comfort will improve Outcome: Progressing   Problem: Safety: Goal: Ability to remain free from injury will improve Outcome: Progressing   Problem: Skin Integrity: Goal: Risk for impaired skin integrity will decrease Outcome: Progressing   
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
## 2020-10-20 NOTE — Progress Notes (Signed)
   10/20/20 1952  Assess: MEWS Score  Temp 98.1 F (36.7 C)  BP (!) 148/72  Pulse Rate 60  Resp (!) 21  Assess: MEWS Score  MEWS Temp 0  MEWS Systolic 0  MEWS Pulse 0  MEWS RR 1  MEWS LOC 1  MEWS Score 2  MEWS Score Color Yellow  Assess: if the MEWS score is Yellow or Red  Were vital signs taken at a resting state? No (Nurse had to pull pt up in the bed to get her comfortable.)  Does the patient meet 2 or more of the SIRS criteria? No  MEWS guidelines implemented *See Row Information* No, vital signs rechecked  Treat  MEWS Interventions Other (Comment) (Pt is not yellow MEWS)  Pain Scale 0-10  Pain Score 0  Take Vital Signs  Increase Vital Sign Frequency   (NA)  Escalate  MEWS: Escalate  (NA)  Notify: Charge Nurse/RN  Name of Charge Nurse/RN Notified Tom, RN  Date Charge Nurse/RN Notified 10/20/20  Time Charge Nurse/RN Notified 2031  Document  Patient Outcome Other (Comment) (NA)  Progress note created (see row info) Yes  Assess: SIRS CRITERIA  SIRS Temperature  0  SIRS Pulse 0  SIRS Respirations  1  SIRS WBC 0  SIRS Score Sum  1

## 2020-10-20 NOTE — Care Management Important Message (Signed)
Important Message  Patient Details IM Letter given to the Patient. Name: SAMINA WEEKES MRN: [id number] Date of Birth: April 11, 1951   Medicare Important Message Given:  Yes     Caren Macadam 10/20/2020, 12:16 PM

## 2020-10-20 NOTE — Progress Notes (Signed)
   10/20/20 2026  Assess: MEWS Score  Temp (!) 97.5 F (36.4 C)  BP (!) 125/58  Pulse Rate 61  Resp 20  Level of Consciousness Alert  SpO2 95 %  O2 Device Room Air  Assess: MEWS Score  MEWS Temp 0  MEWS Systolic 0  MEWS Pulse 0  MEWS RR 0  MEWS LOC 0  MEWS Score 0  MEWS Score Color Green  Assess: if the MEWS score is Yellow or Red  Were vital signs taken at a resting state? Yes  MEWS guidelines implemented *See Row Information* No, vital signs rechecked  Assess: SIRS CRITERIA  SIRS Temperature  0  SIRS Pulse 0  SIRS Respirations  0  SIRS WBC 0  SIRS Score Sum  0
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
## 2020-10-21 NOTE — Progress Notes (Signed)
Physical Therapy Treatment Patient Details Name: Julie Morrow MRN: [id number] DOB: 08/21/1950 Today's Date: 10/21/2020    History of Present Illness Pt is a 70 y/o female with PMHx of bipolar disorder, anxiety disorder, depression who presented to the [facility] ED 09/16/2020 and then admitted to [facility] on 09/20/2020. PT ordered/consulted to address and assess recent decline in functional mobility. Patient was modified independnet on 09/22/20 PT eval.  Then while patient was admitted to behavioral health Hospital, pt was assessed by hospitalist team for chief complaint of change in mental status, who advised that patient come to the hospital and be admitted for observation.    PT Comments    Pt remains confused, she is oriented to self only. She requires verbal & tactile cues and increased time in order to follow commands. +2 total assist for supine to sit. She was unable to come to a full stand with sit to stand using a Stedy with 2 person total assist. Pt sat at edge of bed x 12 minutes with R lateral and posterior lean. Pt's friend of 30 years was visiting, she stated pt is far from her baseline cognitively at present.    Follow Up Recommendations  SNF     Equipment Recommendations  None recommended by PT    Recommendations for Other Services       Precautions / Restrictions Precautions Precautions: Fall Restrictions Weight Bearing Restrictions: No    Mobility  Bed Mobility Overal bed mobility: Needs Assistance Bed Mobility: Supine to Sit;Sit to Supine     Supine to sit: Total assist;+2 for physical assistance;+2 for safety/equipment;HOB elevated Sit to supine: +2 for physical assistance;Total assist   General bed mobility comments: +2 total assist for supine to sit (pt 5%)    Transfers Overall transfer level: Needs assistance   Transfers: Sit to/from Stand Sit to Stand: Total assist;+2 physical assistance;+2 safety/equipment;From elevated surface         General transfer  comment: attempted sit to stand with Stedy and +2 total assist x 2 trials but pt unable to clear buttocks from bed. Maximove recommended  Ambulation/Gait             General Gait Details: unable   Social research officer, government Rankin (Stroke Patients Only)       Balance Overall balance assessment: Needs assistance Sitting-balance support: No upper extremity supported;Feet supported;Single extremity supported Sitting balance-Leahy Scale: Poor Sitting balance - Comments: requires min to mod A for balance 2* R lateral and posterior lean; pt did not respond to verbal cues to correct posture nor to place L hand on bed; pt sat at edge of bed ~12 minutes Postural control: Right lateral lean;Posterior lean                                  Cognition Arousal/Alertness: Awake/alert Behavior During Therapy: Flat affect Overall Cognitive Status: Impaired/Different from baseline Area of Impairment: Attention;Memory;Following commands;Safety/judgement;Awareness;Problem solving;Orientation                 Orientation Level: Disoriented to;Place;Time;Situation   Memory: Decreased short-term memory Following Commands: Follows one step commands with increased time Safety/Judgement: Decreased awareness of safety;Decreased awareness of deficits   Problem Solving: Slow processing;Difficulty sequencing;Requires verbal cues;Requires tactile cues General Comments: pt's long time friend was in the room, she stated pt is able to walk and  talk and is oriented at baseline, current coginitive status is a significant change from baseline      Exercises General Exercises - Lower Extremity Long Arc Quad: AROM;Both;10 reps;Seated    General Comments        Pertinent Vitals/Pain Pain Assessment: No/denies pain    Home Living                      Prior Function            PT Goals (current goals can now be found in the care plan  section) Acute Rehab PT Goals PT Goal Formulation: Patient unable to participate in goal setting Time For Goal Achievement: 10/22/20 Potential to Achieve Goals: Fair Progress towards PT goals: Not progressing toward goals - comment (weakness and confusion are limiting progress)    Frequency    Min 2X/week      PT Plan Current plan remains appropriate    Co-evaluation              AM-PAC PT "6 Clicks" Mobility   Outcome Measure  Help needed turning from your back to your side while in a flat bed without using bedrails?: Total Help needed moving from lying on your back to sitting on the side of a flat bed without using bedrails?: Total Help needed moving to and from a bed to a chair (including a wheelchair)?: Total Help needed standing up from a chair using your arms (e.g., wheelchair or bedside chair)?: Total Help needed to walk in hospital room?: Total Help needed climbing 3-5 steps with a railing? : Total 6 Click Score: 6    End of Session Equipment Utilized During Treatment: Gait belt Activity Tolerance: Patient limited by fatigue;Other (comment) (profoundly weak and confused) Patient left: in chair;with call bell/phone within reach;with chair alarm set Nurse Communication: Mobility status;Need for lift equipment PT Visit Diagnosis: Difficulty in walking, not elsewhere classified (R26.2);Muscle weakness (generalized) (M62.81)     Time: 2706-2376 PT Time Calculation (min) (ACUTE ONLY): 25 min  Charges:  $Therapeutic Activity: 23-37 mins                    Ralene Bathe Kistler PT 10/21/2020  Acute Rehabilitation Services Pager [phone] Office [phone]

## 2020-10-21 NOTE — TOC Progression Note (Signed)
Transition of Care [facility]) - Progression Note    Patient Details  Name: Julie Morrow MRN: [id number] Date of Birth: Oct 23, 1950  Transition of Care [facility]) CM/SW Contact  Julie Morrow, [place] Phone Number: 10/21/2020, 4:32 PM  Clinical Narrative:  My supervisor recommended we apply for MCD for this patient as she will likely need LTC, and we are not getting bed offers from SNFs.  [name], financial navigator, was unable to gather needed information from patient.  Today I spoke with sister [name], who said she would be glad to try to answer questions. Information passed on to [name]. TOC will continue to follow during the course of hospitalization.      Expected Discharge Plan: Skilled Nursing Facility Barriers to Discharge: SNF Pending bed offer  Expected Discharge Plan and Services Expected Discharge Plan: Skilled Nursing Facility   Discharge Planning Services: CM Consult Post Acute Care Choice: Skilled Nursing Facility Living arrangements for the past 2 months: Single Family Home                                       Social Determinants of Health (SDOH) Interventions    Readmission Risk Interventions No flowsheet data found.

## 2020-10-21 NOTE — Plan of Care (Signed)
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
## 2020-10-23 MED ORDER — LIP MEDEX EX OINT: TOPICAL_OINTMENT | CUTANEOUS | Status: DC | PRN

## 2020-10-23 MED ORDER — ZINC OXIDE 12.8 % EX OINT
TOPICAL_OINTMENT | Freq: Three times a day (TID) | CUTANEOUS | Status: DC
  Administered 2020-10-31: 1 via TOPICAL
  Filled 2020-10-23 (×2): qty 56.7

## 2020-10-23 MED ORDER — CHLORHEXIDINE GLUCONATE 0.12 % MT SOLN
15.0000 mL | Freq: Two times a day (BID) | OROMUCOSAL | Status: DC
  Administered 2020-10-23 – 2020-11-07 (×25): 15 mL via OROMUCOSAL
  Filled 2020-10-23 (×29): qty 15

## 2020-10-23 MED ORDER — ORAL CARE MOUTH RINSE
15.0000 mL | Freq: Two times a day (BID) | OROMUCOSAL | Status: DC
  Administered 2020-10-23 – 2020-10-30 (×8): 15 mL via OROMUCOSAL

## 2020-10-23 NOTE — TOC Progression Note (Signed)
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 TOC will continue to follow during the course of hospitalization.     Expected Discharge Plan: Skilled Nursing Facility Barriers to Discharge: SNF Pending bed offer  Expected Discharge Plan and Services Expected Discharge Plan: Skilled Nursing Facility   Discharge Planning Services: CM Consult Post Acute Care Choice: Skilled Nursing Facility Living arrangements for the past 2 months: Single Family Home                                       Social Determinants of Health (SDOH) Interventions    Readmission Risk Interventions No flowsheet data found.

## 2020-10-23 NOTE — Care Management Important Message (Signed)
Important Message  Patient Details IM Letter given to the Patient. Name: NATALEA SUTLIFF MRN: [id number] Date of Birth: March 23, 1951   Medicare Important Message Given:  Yes     Caren Macadam 10/23/2020, 9:44 AM

## 2020-10-23 NOTE — Plan of Care (Signed)
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
## 2020-10-23 NOTE — Consult Note (Signed)
WOC Nurse Consult Note: Patient receiving care in [facility]19.  Assisted with turning by primary RNs. 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  Also, sacral foam dressing had been trapping urine against the skin.  Patient pulls PureWick out when staff attempt to use. Pressure Injury POA: Yes/No/NA Measurement: Wound bed: macerated Drainage (amount, consistency, odor)  Periwound: intact Dressing procedure/placement/frequency: TID application of Triple Paste and apply to buttocks, sacrum, coccyx.  Do NOT use a foam dressing over the sacrum.  Monitor the wound area(s) for worsening of condition such as: Signs/symptoms of infection,  Increase in size,  Development of or worsening of odor, Development of pain, or increased pain at the affected locations.  Notify the medical team if any of these develop.  Thank you for the consult.  Discussed plan of care with the patient and bedside nurse.  WOC nurse will not follow at this time.  Please re-consult the WOC team if needed.  Helmut Muster, RN, MSN, CWOCN, CNS-BC, pager [phone]

## 2020-10-23 NOTE — Progress Notes (Signed)
Nutrition Follow-up  DOCUMENTATION CODES:   Morbid obesity  INTERVENTION:   -Ensure MAX Protein po daily, each supplement provides 150 kcal and 30 grams of protein   -Liquid MVI daily  NUTRITION DIAGNOSIS:   Inadequate oral intake related to dysphagia as evidenced by per patient/family report.  Improving.  GOAL:   Patient will meet greater than or equal to 90% of their needs  Progressing.  MONITOR:   PO intake, Supplement acceptance, Labs, Weight trends, I & O's  ASSESSMENT:   70 y.o. female with a past medical history of bipolar 1 disorder, type 2 diabetes, hypothyroidism, hypertension, hyperlipidemia who initially presented to [facility] ED on 5/3 with severe depression and suicidal ideation.  Patient is currently consuming 75-100% of meals. Accepting Ensure Max. Per MD note, pt is medically stable for discharge but is a difficult placement.  Admission weight: 233 lbs. No other weights this admission.   Medications: Liquid MVI, Miralax, Senokot  Labs reviewed.  Diet Order:   Diet Order             DIET - DYS 1 Room service appropriate? Yes; Fluid consistency: Thin  Diet effective now                   EDUCATION NEEDS:   No education needs have been identified at this time  Skin:  Skin Assessment: Skin Integrity Issues: Skin Integrity Issues:: Other (Comment) Other: MASD mid sacrum  Last BM:  6/1 -type 6  Height:   Ht Readings from Last 1 Encounters:  10/07/20 5\' 1"  (1.549 m)    Weight:   Wt Readings from Last 1 Encounters:  10/07/20 106 kg    BMI:  Body mass index is 44.15 kg/m.  Estimated Nutritional Needs:   Kcal:  1600-1800  Protein:  70-85g  Fluid:  1.8L/day  10/09/20, MS, RD, LDN Inpatient Clinical Dietitian Contact information available via Amion

## 2020-10-23 NOTE — Progress Notes (Signed)
Patient asleep, this RN attempted to give scheduled meds crushed with apple sauce, but patient refused. Becomes irritable when RN tried to woke her up.

## 2020-10-24 ENCOUNTER — Inpatient Hospital Stay (HOSPITAL_COMMUNITY)
Admission: EM | Admit: 2020-10-24 | Discharge: 2020-10-24 | Disposition: A | Discharge status: Home / Self Care | Payer: Medicare Other | Source: Home / Self Care | Attending: Neurology | Admitting: Neurology

## 2020-10-24 ENCOUNTER — Inpatient Hospital Stay (HOSPITAL_COMMUNITY): Payer: Medicare Other

## 2020-10-24 ENCOUNTER — Encounter (HOSPITAL_COMMUNITY): Payer: Self-pay | Admitting: Internal Medicine

## 2020-10-24 LAB — COMPREHENSIVE METABOLIC PANEL
ALT: 26 U/L (ref 0–44)
AST: 23 U/L (ref 15–41)
Albumin: 3.6 g/dL (ref 3.5–5.0)
Alkaline Phosphatase: 90 U/L (ref 38–126)
Anion gap: 11 (ref 5–15)
BUN: 25 mg/dL — ABNORMAL HIGH (ref 8–23)
CO2: 27 mmol/L (ref 22–32)
Calcium: 9.7 mg/dL (ref 8.9–10.3)
Chloride: 103 mmol/L (ref 98–111)
Creatinine, Ser: 0.99 mg/dL (ref 0.44–1.00)
GFR, Estimated: >60 mL/min (ref >60)
Glucose, Bld: 114 mg/dL — ABNORMAL HIGH (ref 70–99)
Potassium: 4.3 mmol/L (ref 3.5–5.1)
Sodium: 141 mmol/L (ref 135–145)
Total Bilirubin: 0.5 mg/dL (ref 0.3–1.2)
Total Protein: 7.1 g/dL (ref 6.5–8.1)

## 2020-10-24 LAB — URINALYSIS, ROUTINE W REFLEX MICROSCOPIC
Bilirubin Urine: NEGATIVE
Glucose, UA: NEGATIVE mg/dL
Ketones, ur: NEGATIVE mg/dL
Nitrite: POSITIVE — AB
Protein, ur: 100 mg/dL — AB
Specific Gravity, Urine: 1.041 — ABNORMAL HIGH (ref 1.005–1.030)
WBC, UA: >50 WBC/hpf — ABNORMAL HIGH (ref 0–5)
pH: 6 (ref 5.0–8.0)

## 2020-10-24 LAB — MAGNESIUM: Magnesium: 2.1 mg/dL (ref 1.7–2.4)

## 2020-10-24 LAB — GLUCOSE, CAPILLARY
Glucose-Capillary: 117 mg/dL — ABNORMAL HIGH (ref 70–99)
Glucose-Capillary: 150 mg/dL — ABNORMAL HIGH (ref 70–99)

## 2020-10-24 LAB — AMMONIA: Ammonia: 28 umol/L (ref 9–35)

## 2020-10-24 LAB — DIFFERENTIAL
Abs Immature Granulocytes: 0.05 10*3/uL (ref 0.00–0.07)
Basophils Absolute: 0.1 10*3/uL (ref 0.0–0.1)
Basophils Relative: 0 %
Eosinophils Absolute: 0.3 10*3/uL (ref 0.0–0.5)
Eosinophils Relative: 2 %
Immature Granulocytes: 0 %
Lymphocytes Relative: 26 %
Lymphs Abs: 3.1 10*3/uL (ref 0.7–4.0)
Monocytes Absolute: 1.4 10*3/uL — ABNORMAL HIGH (ref 0.1–1.0)
Monocytes Relative: 11 %
Neutro Abs: 7.1 10*3/uL (ref 1.7–7.7)
Neutrophils Relative %: 61 %

## 2020-10-24 LAB — CBC
HCT: 42.7 % (ref 36.0–46.0)
Hemoglobin: 14.3 g/dL (ref 12.0–15.0)
MCH: 31.8 pg (ref 26.0–34.0)
MCHC: 33.5 g/dL (ref 30.0–36.0)
MCV: 94.9 fL (ref 80.0–100.0)
Platelets: 139 10*3/uL — ABNORMAL LOW (ref 150–400)
RBC: 4.5 MIL/uL (ref 3.87–5.11)
RDW: 14.3 % (ref 11.5–15.5)
WBC: 11.9 10*3/uL — ABNORMAL HIGH (ref 4.0–10.5)
nRBC: 0 % (ref 0.0–0.2)

## 2020-10-24 LAB — BLOOD GAS, ARTERIAL
Acid-Base Excess: 3.4 mmol/L — ABNORMAL HIGH (ref 0.0–2.0)
Bicarbonate: 27.5 mmol/L (ref 20.0–28.0)
Drawn by: 560031
FIO2: 21
O2 Saturation: 92.8 %
Patient temperature: 98.6
pCO2 arterial: 41.7 mmHg (ref 32.0–48.0)
pH, Arterial: 7.435 (ref 7.350–7.450)
pO2, Arterial: 64.3 mmHg — ABNORMAL LOW (ref 83.0–108.0)

## 2020-10-24 LAB — VALPROIC ACID LEVEL: Valproic Acid Lvl: 39 ug/mL — ABNORMAL LOW (ref 50.0–100.0)

## 2020-10-24 MED ORDER — THIAMINE HCL 100 MG/ML IJ SOLN
500.0000 mg | Freq: Three times a day (TID) | INTRAVENOUS | Status: AC
  Administered 2020-10-24 – 2020-10-27 (×9): 500 mg via INTRAVENOUS
  Filled 2020-10-24 (×10): qty 5

## 2020-10-24 MED ORDER — SODIUM CHLORIDE 0.9 % IV SOLN: INTRAVENOUS | Status: DC

## 2020-10-24 MED ORDER — SODIUM CHLORIDE (PF) 0.9 % IJ SOLN
INTRAMUSCULAR | Status: AC
  Filled 2020-10-24: qty 100

## 2020-10-24 MED ORDER — IOHEXOL 350 MG/ML SOLN
100.0000 mL | Freq: Once | INTRAVENOUS | Status: AC | PRN
  Administered 2020-10-24: 100 mL via INTRAVENOUS

## 2020-10-24 NOTE — Progress Notes (Addendum)
Patient complained of substernal chest pain. When asked to describe the pain, patient stated "like I'm having a heart attack". Patient states the pain does note radiate. No SOB. Patient's mental status noted to have returned to baseline significantly. Able to answer questions and talk to sibling on the phone. Dr. Dartha Lodge notified. Patient is still mildly confused and combative. This is noted to be patients baseline. Charge nurse Kasia notified. EKG and updated vitals taken. Patient does not appear to be in any distress. Patient noted to be alert, joking, and laughing. Will continue to monitor the patient.
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
## 2020-10-24 NOTE — Procedures (Signed)
Patient Name: SAFIRE GORDIN  MRN: [id number]  Epilepsy Attending: Charlsie Quest  Referring Physician/Provider: Dr. Onalee Hua Date: 10/24/2020 Duration: 23.37 mins  Patient history: 70 year old female with progressive encephalopathy.  EEG to evaluate for seizures.  Level of alertness: Awake  AEDs during EEG study: None  Technical aspects: This EEG study was done with scalp electrodes positioned according to the 10-20 International system of electrode placement. Electrical activity was acquired at a sampling rate of 500Hz  and reviewed with a high frequency filter of 70Hz  and a low frequency filter of 1Hz . EEG data were recorded continuously and digitally stored.   Description: No posterior dominant rhythm was seen. EEG showed continuous generalized 5 to 6 Hz theta slowing. Hyperventilation and photic stimulation were not performed.     ABNORMALITY - Continuous slow, generalized  IMPRESSION: This study is suggestive of mild to moderate diffuse encephalopathy, nonspecific etiology. No seizures or epileptiform discharges were seen throughout the recording.  Michalene Debruler 

## 2020-10-24 NOTE — Progress Notes (Signed)
EEG Completed; Results Pending  

## 2020-10-24 NOTE — Significant Event (Signed)
Rapid Response Event Note   Reason for Call :  Called stating patient unresponsive   Initial Focused Assessment:  Upon assessment, pt is awake, blinking, breathing. Not responding to commands, not speaking when asked questions. Pinched patient, she withdrew in all four extremities. Her baseline is confused to time, place, and situation. Patient typically combative so this is a big change for her.      Interventions:  Blood sugar checked to ensure no hypoglycemia driving confusion. Blood sugar 150. Vitals stable. O2 sat 93. Blood pressure stable. Pulse in the 70's. RR 18. Taken down for CT angio, CT negative. Dr. Dartha Lodge called to bedside to assess. Teleneurology paged in to assess patient. She does not follow most commands and shows profound weakness in bilateral extremities. Not able to answer simple questions like her age. Dr. Fransisco Hertz requested CT perfusion. Patient transported down to rule out stroke.   Plan of Care: labs drawn to assess confusion, ct scans performed, ABG ordered to assess for hypercapnia.     Event Summary:   MD Notified: ogbata @ 37; kirkpatrick @ 0930 Call Time: [phone] Arrival Time: 7902 End Time: 1047  Larence Penning, RN
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
## 2020-10-24 NOTE — Progress Notes (Signed)
Patient took all her scheduled meds with apple sauce. Patient slept almost the entire shift but more alert this morning. Patient has rambling speech and some words are difficulty to understand.

## 2020-10-24 NOTE — Plan of Care (Signed)

## 2020-10-24 NOTE — Progress Notes (Signed)
Patient becoming more agitated, yelling and screaming. Incoherent and rambling speech noted. Becomes boisterous and tried to hit the staff during repositioning. On call provider notified through secure chat and clarified if night time medicine can be given. On call provider ordered to give.

## 2020-10-24 NOTE — Progress Notes (Signed)
Pt LOC changed and pt nonresponsive.  Barbara LPN alerted CN and RRN called to bedside.  VSS and CBG 150's. Code stroke called and pt transported down to CT.  Dr Dartha Lodge paged twice with no return call at this time. Pt back up from CT and more responsive, RRN at the bedside and completing neuro-tele consult.  RRN secure chatted Ogbata and no response at this time.

## 2020-10-24 NOTE — Progress Notes (Signed)
Ogbata returned page at 1000, MD updated on patient status and tele neuro consult taking place at bedside with RRN. MD inquiring on pt status and medications.  Neuro recommending additional CT. This RN requested Ogbata to come to bedside to evaluate the patient, Dartha Lodge states to get CT scan as recommended by neuro and ended the call.

## 2020-10-25 MED ORDER — SODIUM CHLORIDE 0.9 % IV SOLN
1.0000 g | INTRAVENOUS | Status: DC
  Administered 2020-10-25: 1 g via INTRAVENOUS
  Filled 2020-10-25: qty 1
  Filled 2020-10-25: qty 10

## 2020-10-25 NOTE — Progress Notes (Signed)
Neurology Progress Note  S: She says she is doing better. She denies HA, blurry vision, double vision, n/v, pain, numbness/tingling or weakness of extremity.   PT note recommends SNF due to significant need for assistance.   O: Current vital signs: BP 121/86 (BP Location: Left Arm)   Pulse 60   Temp 98 F (36.7 C) (Oral)   Resp 16   Ht 5\' 1"  (1.549 m)   Wt 106 kg   SpO2 98%   BMI 44.15 kg/m  Vital signs in last 24 hours: Temp:  [97.6 F (36.4 C)-99.6 F (37.6 C)] 98 F (36.7 C) (06/11 1546) Pulse Rate:  [59-82] 60 (06/11 1546) Resp:  [16-18] 16 (06/11 1546) BP: (121-184)/(65-156) 121/86 (06/11 1546) SpO2:  [90 %-100 %] 98 % (06/11 1546)  GENERAL: Awake, alert in NAD. HEENT: Normocephalic and atraumatic. LUNGS: Normal respiratory effort.  Ext: warm. Psych: Affect is light.   NEURO:  Mental Status: Alert. Says hi when NP walks in room. Is smiling. Oriented to her name, but not to place, day, date, month. Follows some commands, but does not understand more complicated commands (like performs strength exam, but does not understand FNF). Easily distracted, but maintains focus better today than compared to previous chart notes.   Speech/Language: speech is without aphasia or dysarthria.  Naming, repetition, fluency intact. +bradyphrenia.   Cranial Nerves:  II: PERRL.  III, IV, VI: EOMI. Eyelids elevate symmetrically.  V: Sensation is intact to light touch and symmetrical to face.  VII: Smile is symmetrical.  VIII: hearing intact to voice. IX, X: Palate elevates symmetrically. Phonation is normal.  04-10-1971 shrug 5/5. XII: tongue is midline without fasciculations. Motor: 5/5 strength to all muscle groups tested.  Tone: is normal and bulk is normal Sensation- Intact to light touch bilaterally. Does not understand extinction testing.     Coordination: FNF not ataxic, she just doesn't understand activity.   Gait- deferred.  Medications  Current Facility-Administered  Medications:    0.9 %  sodium chloride infusion, , Intravenous, Continuous, Ogbata, Sylvester I, MD   0.9 %  sodium chloride infusion, , Intravenous, Continuous, YB:[id number] I, MD, Last Rate: 75 mL/hr at 10/25/20 0517, New Bag at 10/25/20 0517   acetaminophen (TYLENOL) tablet 650 mg, 650 mg, Oral, Q6H PRN **OR** acetaminophen (TYLENOL) suppository 650 mg, 650 mg, Rectal, Q6H PRN, 12/25/20, MD   aspirin chewable tablet 81 mg, 81 mg, Oral, Daily, Jae Dire, MD, 81 mg at 10/25/20 1154   benztropine (COGENTIN) tablet 0.5 mg, 0.5 mg, Oral, BID, 12/25/20 E, MD, 0.5 mg at 10/25/20 1153   buPROPion (WELLBUTRIN XL) 24 hr tablet 300 mg, 300 mg, Oral, Daily, 12/25/20 E, MD, 300 mg at 10/25/20 1153   cefTRIAXone (ROCEPHIN) 1 g in sodium chloride 0.9 % 100 mL IVPB, 1 g, Intravenous, Q24H, Ogbata, Sylvester I, MD, Last Rate: 200 mL/hr at 10/25/20 1509, 1 g at 10/25/20 1509   chlorhexidine (PERIDEX) 0.12 % solution 15 mL, 15 mL, Mouth Rinse, BID, Adhikari, Amrit, MD, 15 mL at 10/23/20 2049   divalproex (DEPAKOTE ER) 24 hr tablet 750 mg, 750 mg, Oral, QHS, 2050 E, MD, 750 mg at 10/24/20 2113   enoxaparin (LOVENOX) injection 40 mg, 40 mg, Subcutaneous, Q24H, 2114, MD, 40 mg at 10/22/20 1801   levothyroxine (SYNTHROID) tablet 150 mcg, 150 mcg, Oral, Daily, 12/22/20, MD, 150 mcg at 10/25/20 0514   lip balm (CARMEX) ointment, , Topical, PRN, 12/25/20  A, DO   MEDLINE mouth rinse, 15 mL, Mouth Rinse, q12n4p, Adhikari, Amrit, MD, 15 mL at 10/24/20 1753   multivitamin liquid 15 mL, 15 mL, Oral, Daily, Albertine Grates, MD, 15 mL at 10/25/20 1154   nystatin (MYCOSTATIN/NYSTOP) topical powder, , Topical, BID, Albertine Grates, MD, Given at 10/25/20 1155   polyethylene glycol (MIRALAX / GLYCOLAX) packet 17 g, 17 g, Oral, Daily PRN, Jae Dire, MD   polyethylene glycol (MIRALAX / GLYCOLAX) packet 17 g, 17 g, Oral, Daily, Albertine Grates, MD, 17 g at 10/23/20 1107   protein supplement (ENSURE  MAX) liquid, 11 oz, Oral, BID, Albertine Grates, MD, 11 oz at 10/23/20 2049   risperiDONE (RISPERDAL) tablet 2 mg, 2 mg, Oral, QHS, Jae Dire, MD, 2 mg at 10/24/20 2113   rosuvastatin (CRESTOR) tablet 40 mg, 40 mg, Oral, Daily, Jae Dire, MD, 40 mg at 10/25/20 1152   senna-docusate (Senokot-S) tablet 1 tablet, 1 tablet, Oral, QHS, Albertine Grates, MD, 1 tablet at 10/24/20 2113   sodium chloride flush (NS) 0.9 % injection 3 mL, 3 mL, Intravenous, Q12H, Whitney Post E, MD, 3 mL at 10/24/20 2125   thiamine 500mg  in normal saline (35ml) IVPB, 500 mg, Intravenous, TID, 45m, MD, Last Rate: 100 mL/hr at 10/25/20 1011, 500 mg at 10/25/20 1011   traZODone (DESYREL) tablet 150 mg, 150 mg, Oral, QHS PRN, 12/25/20, MD, 150 mg at 10/24/20 2112   Zinc Oxide (TRIPLE PASTE) 12.8 % ointment, , Topical, TID, Kyle, Tyrone A, DO, Given at 10/25/20 1203  Labs: UA +.    Imaging MD has reviewed images in epic and the results pertinent to this consultation are: CT Head 10/14/20 Stable CT.  No acute abnormality no change from the recent study.  CTA head and neck  Normal CT angiography, other than tortuosity of the vessels likely due to hypertension. No evidence of soft or calcified atherosclerotic plaque, stenosis or vessel occlusion.   Perfusion study is nondiagnostic because abnormal bolus and pronounced patient motion. In looking at the source data, I do not appreciate any asymmetry of the parameters from right to left.  EEG: This study is suggestive of mild to moderate diffuse encephalopathy, nonspecific etiology. No seizures or epileptiform discharges were seen throughout the recording.  Assessment : 70 yo female who was staying in [facility] for awhile and was transferred to [facility] 18 days ago for AMS. A tele neuro consult was done 10/24/20 with encephalopathy thought to be attributed to by progressive neuropsychiatric decline, stressors, bipolar disorder and recent medication changes. Imaging was  negative for acute finding. She has a new + UTI 10/24/20 and has been placed on antibiotics.   Still feel her presentation is consistent with toxic/metabolic encephalopathy, new UTI, and history of psychiatric diagnoses.   Plan: -continue high dose Thiamine, further supplementation per psychiatry -discontinued Vitamin B1 level as Thiamine has already been given.  -psych medications being managed by psychiatry.  -minimize any sedative or anticholinergic drugs as they can cause worsening delirium in the elderly.    Pt seen by 12/24/20, MSN, APN-BC/Nurse Practitioner/Neuro and later by MD.  Pager: Jimmye Norman  Neurology Attending Attestation   I examined the patient and discussed plan with Ms. [phone]. Above note has been edited by me to reflect my findings and recommendations. Exam markedly improved today after starting abx for UTI. No further inpatient neurologic w/u indicated at this time. Neurology to sign off, but please re-engage if additional questions arise.  Bing Neighbors, MD [facility] [phone]   If 7pm- 7am, please page neurology on call as listed in [facility].

## 2020-10-25 NOTE — Plan of Care (Signed)

## 2020-10-25 NOTE — Progress Notes (Signed)
Physical Therapy Treatment Patient Details Name: Julie Morrow MRN: [id number] DOB: 1950-11-20 Today's Date: 10/25/2020    History of Present Illness Pt is a 70 y/o female with PMHx of bipolar disorder, anxiety disorder, depression who presented to the [facility] ED 09/16/2020 and then admitted to [facility] on 09/20/2020. PT ordered/consulted to address and assess recent decline in functional mobility. Patient was modified independnet on 09/22/20 PT eval.  Then while patient was admitted to behavioral [facility], pt was assessed by hospitalist team for chief complaint of change in mental status, who advised that patient come to the hospital and be admitted for observation.    PT Comments    Total assist for bed mobility, pt unable to achieve full upright sitting due to heavy R lateral trunk lean. Unable to attempt transfers due to poor sitting balance. Mechanical lift recommended. Pt remains confused. RN stated pt is starting a new antibiotic today for UTI.   Follow Up Recommendations  SNF     Equipment Recommendations  None recommended by PT    Recommendations for Other Services       Precautions / Restrictions Precautions Precautions: Fall Restrictions Weight Bearing Restrictions: No    Mobility  Bed Mobility Overal bed mobility: Needs Assistance Bed Mobility: Supine to Sit     Supine to sit: Total assist;+2 for physical assistance;+2 for safety/equipment;HOB elevated Sit to supine: +2 for physical assistance;Total assist   General bed mobility comments: pt distracted by phone call during PT session, difficulty with focusing on task, unable to come to a full upright sit due to heavy R lateral trunk lean: +2 total to scoot up in bed    Transfers                 General transfer comment: did not attempt 2* poor sitting balance  Ambulation/Gait                 Stairs             Wheelchair Mobility    Modified Rankin (Stroke Patients Only)        Balance Overall balance assessment: Needs assistance Sitting-balance support: No upper extremity supported;Feet supported;Bilateral upper extremity supported Sitting balance-Leahy Scale: Zero Sitting balance - Comments: requires max A for balance 2* R lateral and posterior lean Postural control: Right lateral lean;Posterior lean                                  Cognition Arousal/Alertness: Awake/alert Behavior During Therapy: WFL for tasks assessed/performed Overall Cognitive Status: No family/caregiver present to determine baseline cognitive functioning Area of Impairment: Attention;Memory;Following commands;Safety/judgement;Awareness;Problem solving;Orientation                 Orientation Level: Disoriented to;Time   Memory: Decreased short-term memory Following Commands: Follows multi-step commands inconsistently;Follows one step commands with increased time Safety/Judgement: Decreased awareness of safety;Decreased awareness of deficits     General Comments: multimodal cues for activities, extra time to process, pt initiates only after cueing      Exercises      General Comments        Pertinent Vitals/Pain Pain Assessment: No/denies pain    Home Living                      Prior Function            PT Goals (current goals can now be found  in the care plan section) Acute Rehab PT Goals PT Goal Formulation: Patient unable to participate in goal setting Time For Goal Achievement: 10/22/20 Potential to Achieve Goals: Fair Progress towards PT goals: Not progressing toward goals - comment (weak and confused)    Frequency    Min 2X/week      PT Plan Current plan remains appropriate    Co-evaluation              AM-PAC PT "6 Clicks" Mobility   Outcome Measure  Help needed turning from your back to your side while in a flat bed without using bedrails?: Total Help needed moving from lying on your back to sitting on the side  of a flat bed without using bedrails?: Total Help needed moving to and from a bed to a chair (including a wheelchair)?: Total Help needed standing up from a chair using your arms (e.g., wheelchair or bedside chair)?: Total Help needed to walk in hospital room?: Total Help needed climbing 3-5 steps with a railing? : Total 6 Click Score: 6    End of Session Equipment Utilized During Treatment: Gait belt Activity Tolerance: Patient limited by fatigue;Other (comment) (profoundly weak and confused) Patient left: with call bell/phone within reach;in bed;with bed alarm set Nurse Communication: Mobility status;Need for lift equipment PT Visit Diagnosis: Difficulty in walking, not elsewhere classified (R26.2);Muscle weakness (generalized) (M62.81)     Time: 8416-6063 PT Time Calculation (min) (ACUTE ONLY): 17 min  Charges:  $Therapeutic Activity: 8-22 mins                     Ralene Bathe Kistler PT 10/25/2020  Acute Rehabilitation Services Pager [phone] Office [phone]
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
## 2020-10-26 LAB — RENAL FUNCTION PANEL
Albumin: 3.1 g/dL — ABNORMAL LOW (ref 3.5–5.0)
Anion gap: 7 (ref 5–15)
BUN: 13 mg/dL (ref 8–23)
CO2: 26 mmol/L (ref 22–32)
Calcium: 8.9 mg/dL (ref 8.9–10.3)
Chloride: 105 mmol/L (ref 98–111)
Creatinine, Ser: 0.69 mg/dL (ref 0.44–1.00)
GFR, Estimated: >60 mL/min (ref >60)
Glucose, Bld: 162 mg/dL — ABNORMAL HIGH (ref 70–99)
Phosphorus: 3.4 mg/dL (ref 2.5–4.6)
Potassium: 3.9 mmol/L (ref 3.5–5.1)
Sodium: 138 mmol/L (ref 135–145)

## 2020-10-26 LAB — CBC WITH DIFFERENTIAL/PLATELET
Abs Immature Granulocytes: 0.03 10*3/uL (ref 0.00–0.07)
Basophils Absolute: 0 10*3/uL (ref 0.0–0.1)
Basophils Relative: 0 %
Eosinophils Absolute: 0.2 10*3/uL (ref 0.0–0.5)
Eosinophils Relative: 3 %
HCT: 39.6 % (ref 36.0–46.0)
Hemoglobin: 12.9 g/dL (ref 12.0–15.0)
Immature Granulocytes: 0 %
Lymphocytes Relative: 18 %
Lymphs Abs: 1.5 10*3/uL (ref 0.7–4.0)
MCH: 31.5 pg (ref 26.0–34.0)
MCHC: 32.6 g/dL (ref 30.0–36.0)
MCV: 96.6 fL (ref 80.0–100.0)
Monocytes Absolute: 0.6 10*3/uL (ref 0.1–1.0)
Monocytes Relative: 7 %
Neutro Abs: 5.9 10*3/uL (ref 1.7–7.7)
Neutrophils Relative %: 72 %
Platelets: 155 10*3/uL (ref 150–400)
RBC: 4.1 MIL/uL (ref 3.87–5.11)
RDW: 13.8 % (ref 11.5–15.5)
WBC: 8.3 10*3/uL (ref 4.0–10.5)
nRBC: 0 % (ref 0.0–0.2)

## 2020-10-26 LAB — MAGNESIUM: Magnesium: 1.7 mg/dL (ref 1.7–2.4)

## 2020-10-26 MED ORDER — SODIUM CHLORIDE 0.9 % IV SOLN
2.0000 g | Freq: Two times a day (BID) | INTRAVENOUS | Status: DC
  Administered 2020-10-26 – 2020-10-27 (×3): 2 g via INTRAVENOUS
  Filled 2020-10-26 (×3): qty 2

## 2020-10-26 NOTE — Progress Notes (Signed)
Pharmacy Antibiotic Note  Julie Morrow is a 70 y.o. female admitted on 10/07/2020 with UTI.  Pharmacy has been consulted for cefepime dosing.  Patient initiated started on ceftriaxone for UTI, urine culture now growing pseudomonas, antibiotics being changed to cefepime.   Today, 10/26/20 WBC WNL SCr WNL Afebrile  Plan: Cefepime 2 g IV 12h Follow renal function and sensitivities  Height: 5\' 1"  (154.9 cm) Weight: 106 kg (233 lb 11 oz) IBW/kg (Calculated) : 47.8  Temp (24hrs), Avg:98.5 F (36.9 C), Min:98 F (36.7 C), Max:99.6 F (37.6 C)  Recent Labs  Lab 10/24/20 1119 10/24/20 1327 10/26/20 1155  WBC  --  11.9* 8.3  CREATININE 0.99  --  0.69    Estimated Creatinine Clearance: 74.5 mL/min (by C-G formula based on SCr of 0.69 mg/dL).    No Known Allergies  Antimicrobials this admission: ceftriaxone 6/11 >> 6/12 cefepime 6/12 >>   Dose adjustments this admission:   Microbiology results: 6/10 UCx: >100 pseudomonas aeruginosa, S to follow  5/28 UCx (cath): >100K Proteus mirabilis (R nitrofurantoin)  Thank you for allowing pharmacy to be a part of this patient's care.  6/28, PharmD 10/26/2020 1:05 PM
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
## 2020-10-27 LAB — URINE CULTURE
Culture: >=100000 — AB
Special Requests: NORMAL

## 2020-10-27 MED ORDER — CIPROFLOXACIN HCL 500 MG PO TABS
500.0000 mg | ORAL_TABLET | Freq: Two times a day (BID) | ORAL | Status: AC
  Administered 2020-10-27 – 2020-10-31 (×8): 500 mg via ORAL
  Filled 2020-10-27 (×8): qty 1
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
## 2020-10-27 NOTE — TOC Progression Note (Signed)
Transition of Care [facility]) - Progression Note    Patient Details  Name: Julie Morrow MRN: [id number] Date of Birth: [birth year]  Transition of Care [facility]) CM/SW Contact  Ida Rogue, [place] Phone Number: 10/27/2020, 2:45 PM  Clinical Narrative:   Per supervisor instruction, left message for Judeth Cornfield at [facility] [phone]. TOC will continue to follow during the course of hospitalization.     Expected Discharge Plan: Skilled Nursing Facility Barriers to Discharge: SNF Pending bed offer  Expected Discharge Plan and Services Expected Discharge Plan: Skilled Nursing Facility   Discharge Planning Services: CM Consult Post Acute Care Choice: Skilled Nursing Facility Living arrangements for the past 2 months: Single Family Home                                       Social Determinants of Health (SDOH) Interventions    Readmission Risk Interventions No flowsheet data found.

## 2020-10-28 LAB — VITAMIN B1: Vitamin B1 (Thiamine): 147.2 nmol/L (ref 66.5–200.0)

## 2020-10-28 NOTE — Progress Notes (Signed)
Physical Therapy Treatment Patient Details Name: Julie Morrow MRN: [id number] DOB: 11/06/1950 Today's Date: 10/28/2020    History of Present Illness Pt is a 70 y/o female with PMHx of bipolar disorder, anxiety disorder, depression who presented to the [facility] ED 09/16/2020 and then admitted to [facility] on 09/20/2020. PT ordered/consulted to address and assess recent decline in functional mobility. Patient was modified independnet on 09/22/20 PT eval.  Then while patient was admitted to behavioral health Hospital, pt was assessed by hospitalist team for chief complaint of change in mental status, who advised that patient come to the hospital and be admitted for observation.    PT Comments    Patient remains confused but more alert and able to follow simple commands with extra time and multimodal cues. She required Mod-Max +2 assist for LE's and trunk to sit up at EOB and was able to maintain balance with bil UE support and min guard for safety. Stedy use for repeated sit<>stands and transfer to [facility] and recliner due to large B. Patient c/o discomfort with pericare and barrier cream applied to protect skin. EOS pt in recliner with lift pad under cushioned seat for safe transfer with RN staff later this afternoon. Pt remains appropriate for SNF setting rehab to address impairments and progress mobility. Acute PT will progress as able.    Follow Up Recommendations  SNF     Equipment Recommendations  None recommended by PT    Recommendations for Other Services       Precautions / Restrictions Precautions Precautions: Fall Restrictions Weight Bearing Restrictions: No    Mobility  Bed Mobility Overal bed mobility: Needs Assistance Bed Mobility: Supine to Sit     Supine to sit: Mod assist;+2 for safety/equipment;+2 for physical assistance;HOB elevated;Max assist     General bed mobility comments: Pt following cues more today and initiated LE movement to EOB and reaching to bed rail with  tactile/verbal cues. Mod assist for LE's and Max assist +2 to raise trunk fully. Pt requried mod assist +2 with use of bed pad to scoot atn at EOB and stabilize balanace with Bil UE support.    Transfers Overall transfer level: Needs assistance   Transfers: Sit to/from Stand;Stand Pivot Transfers Sit to Stand: Mod assist;+2 physical assistance;From elevated surface;+2 safety/equipment Stand pivot transfers: Total assist;+2 physical assistance;+2 safety/equipment       General transfer comment: Mod +2 assist from elevated EOB, Stedy paddles, and BSC during session. Pt required repeated cues to redirect and stabilize.Total Assist with use of Stedy for Bed>BSC>recliner for transfer.  Ambulation/Gait                 Stairs             Wheelchair Mobility    Modified Rankin (Stroke Patients Only)       Balance Overall balance assessment: Needs assistance Sitting-balance support: Bilateral upper extremity supported;Feet supported Sitting balance-Leahy Scale: Poor     Standing balance support: Bilateral upper extremity supported Standing balance-Leahy Scale: Poor Standing balance comment: reliant on Stedy external support of therapist                            Cognition Arousal/Alertness: Awake/alert Behavior During Therapy: WFL for tasks assessed/performed Overall Cognitive Status: Impaired/Different from baseline Area of Impairment: Attention;Memory;Following commands;Safety/judgement;Awareness;Problem solving;Orientation                 Orientation Level: Disoriented to;Place;Situation;Time Current Attention Level: Sustained  Memory: Decreased short-term memory Following Commands: Follows multi-step commands inconsistently;Follows one step commands with increased time;Follows multi-step commands with increased time Safety/Judgement: Decreased awareness of safety;Decreased awareness of deficits Awareness: Emergent Problem Solving: Slow  processing;Difficulty sequencing;Requires verbal cues;Requires tactile cues General Comments: pt requires multimodal cues and re-direction for safety      Exercises      General Comments General comments (skin integrity, edema, etc.): pt having significant BM while sitting EOB and on Stedy, transfer to [facility]. Pt appeared to be going but more when she stood in partial squat on Stedy vs sitting on BSC. patient required total assist for perianal care.      Pertinent Vitals/Pain Pain Assessment: Faces Faces Pain Scale: Hurts even more Pain Location: perianal area with pericare due to BM Pain Descriptors / Indicators: Grimacing Pain Intervention(s): Monitored during session    Home Living                      Prior Function            PT Goals (current goals can now be found in the care plan section) Acute Rehab PT Goals PT Goal Formulation: Patient unable to participate in goal setting Time For Goal Achievement: 11/11/20 Potential to Achieve Goals: Fair Progress towards PT goals: Not progressing toward goals - comment;Goals downgraded-see care plan    Frequency    Min 2X/week      PT Plan Current plan remains appropriate    Co-evaluation              AM-PAC PT "6 Clicks" Mobility   Outcome Measure  Help needed turning from your back to your side while in a flat bed without using bedrails?: Total Help needed moving from lying on your back to sitting on the side of a flat bed without using bedrails?: Total Help needed moving to and from a bed to a chair (including a wheelchair)?: Total Help needed standing up from a chair using your arms (e.g., wheelchair or bedside chair)?: Total Help needed to walk in hospital room?: Total Help needed climbing 3-5 steps with a railing? : Total 6 Click Score: 6    End of Session Equipment Utilized During Treatment: Gait belt Activity Tolerance: Patient tolerated treatment well Patient left: with call bell/phone within  reach;in chair;with chair alarm set Nurse Communication: Mobility status;Need for lift equipment PT Visit Diagnosis: Difficulty in walking, not elsewhere classified (R26.2);Muscle weakness (generalized) (M62.81)     Time: 6256-3893 PT Time Calculation (min) (ACUTE ONLY): 47 min  Charges:  $Therapeutic Activity: 38-52 mins                     Wynn Maudlin, DPT Acute Rehabilitation Services Office [phone] Pager [phone]    Anitra Lauth 10/28/2020, 10:38 AM
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
## 2020-10-29 NOTE — TOC Progression Note (Signed)
Transition of Care [facility]) - Progression Note    Patient Details  Name: Julie Morrow MRN: [id number] Date of Birth: 07-17-1950  Transition of Care [facility]) CM/SW Contact  Ida Rogue, [place] Phone Number: 10/29/2020, 11:47 AM  Clinical Narrative:   CSW responding to request from supervisor to send information to [facility].  Information sent, with follow up call to [employer], gatekeeper at facility. TOC will continue to follow during the course of hospitalization.     Expected Discharge Plan: Skilled Nursing Facility Barriers to Discharge: SNF Pending bed offer  Expected Discharge Plan and Services Expected Discharge Plan: Skilled Nursing Facility   Discharge Planning Services: CM Consult Post Acute Care Choice: Skilled Nursing Facility Living arrangements for the past 2 months: Single Family Home                                       Social Determinants of Health (SDOH) Interventions    Readmission Risk Interventions No flowsheet data found.

## 2020-10-29 NOTE — Plan of Care (Signed)
?  Problem: Elimination: ?Goal: Will not experience complications related to bowel motility ?Outcome: Progressing ?  ?Problem: Pain Managment: ?Goal: General experience of comfort will improve ?Outcome: Progressing ?  ?Problem: Safety: ?Goal: Ability to remain free from injury will improve ?Outcome: Progressing ?  ?

## 2020-10-29 NOTE — Progress Notes (Signed)
10/29/2020  Patient bottom has MASD She has a skin tear below her rectum and a skin tear on the left buttock. Cleansed and Triple ointment applied.

## 2020-10-30 MED ORDER — BOOST / RESOURCE BREEZE PO LIQD CUSTOM
1.0000 | Freq: Three times a day (TID) | ORAL | Status: DC
  Administered 2020-10-30 – 2020-11-06 (×16): 1 via ORAL

## 2020-10-30 NOTE — Progress Notes (Signed)
Nutrition Follow-up  DOCUMENTATION CODES:   Morbid obesity  INTERVENTION:   -Recommend new weight for admission, if able.  -Ensure MAX Protein po daily, each supplement provides 150 kcal and 30 grams of protein  -Boost Breeze po BID, each supplement provides 250 kcal and 9 grams of protein  -Liquid MVI daily  NUTRITION DIAGNOSIS:   Inadequate oral intake related to dysphagia as evidenced by per patient/family report.  Ongoing.  GOAL:   Patient will meet greater than or equal to 90% of their needs  Progressing.  MONITOR:   PO intake, Supplement acceptance, Labs, Weight trends, I & O's   ASSESSMENT:   70 y.o. female with a past medical history of bipolar 1 disorder, type 2 diabetes, hypothyroidism, hypertension, hyperlipidemia who initially presented to [facility] ED on 5/3 with severe depression and suicidal ideation.  Patient is currently consuming 25% of clear liquid meals. Accepting Ensure Max. Now with new MASD areas. Will add Boost Breeze as well for additional kcals and protein.  Per MD note, pt is medically stable for discharge but is a difficult placement.   Admission weight: 233 lbs. No other weights this admission. Last recorded 5/24.  Recommend checking weight if able.   Medications: Liquid MVI, Miralax, Senokot   Labs reviewed.    Diet Order:   Diet Order             Diet clear liquid Room service appropriate? Yes; Fluid consistency: Thin  Diet effective now                   EDUCATION NEEDS:   No education needs have been identified at this time  Skin:  Skin Assessment: Skin Integrity Issues: Skin Integrity Issues:: Other (Comment) Other: MASD mid sacrum  Last BM:  6/1 -type 6  Height:   Ht Readings from Last 1 Encounters:  10/07/20 5\' 1"  (1.549 m)    Weight:   Wt Readings from Last 1 Encounters:  10/07/20 106 kg    BMI:  Body mass index is 44.15 kg/m.  Estimated Nutritional Needs:   Kcal:  1600-1800  Protein:   70-85g  Fluid:  1.8L/day   10/09/20, MS, RD, LDN Inpatient Clinical Dietitian Contact information available via Amion
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
## 2020-10-31 NOTE — TOC Progression Note (Signed)
Transition of Care [facility]) - Progression Note    Patient Details  Name: Julie Morrow MRN: [id number] Date of Birth: 20-Apr-1951  Transition [facility] CM/SW Contact  Amada Jupiter, LCSW Phone Number: 10/31/2020, 11:33 AM  Clinical Narrative:    Have just been declined by [facility] and [facility] (2 facilities who were actively considering).  TOC will continue to work on securing bed.   Expected Discharge Plan: Skilled Nursing Facility Barriers to Discharge: SNF Pending bed offer  Expected Discharge Plan and Services Expected Discharge Plan: Skilled Nursing Facility   Discharge Planning Services: CM Consult Post Acute Care Choice: Skilled Nursing Facility Living arrangements for the past 2 months: Single Family Home                                       Social Determinants of Health (SDOH) Interventions    Readmission Risk Interventions No flowsheet data found.

## 2020-10-31 NOTE — Progress Notes (Signed)
Physical Therapy Treatment Patient Details Name: Julie Morrow MRN: [id number] DOB: 1950/09/11 Today's Date: 10/31/2020    History of Present Illness Pt is a 70 y/o female with PMHx of bipolar disorder, anxiety disorder, depression who presented to the [facility] ED 09/16/2020 and then admitted to [facility] on 09/20/2020. PT ordered/consulted to address and assess recent decline in functional mobility. Patient was modified independnet on 09/22/20 PT eval.  Then while patient was admitted to behavioral health Hospital, pt was assessed by hospitalist team for chief complaint of change in mental status, who advised that patient come to the hospital and be admitted for observation.    PT Comments    The patient  requires frequent multimodal cues and 2 max/total assistance for rolling in bed . Patient tends to hold rails and resist when turning, does not leave legs flexed to facilitate the roll.Patient did participate in LE exercises while in semi bed/chair position. Continue PT for mobility and exercises.   Follow Up Recommendations  SNF     Equipment Recommendations  None recommended by PT    Recommendations for Other Services       Precautions / Restrictions Precautions Precautions: Fall Precaution Comments: incontinence frequently.    Mobility  Bed Mobility   Bed Mobility: Rolling Rolling: Total assist;+2 for physical assistance;+2 for safety/equipment         General bed mobility comments: patient did initiate infrequently to reach for rail to roll. Multi,modal cues to flex each  leg to facilitate rolling. Patient does not keep legs flexed when rolling. Tends to push back on the rail with arms, thus resisting roll. More difficulty rolling to left. placed in bed chair position to work on leg exerciss with much encouragement. Patient resists attempts to sit forward upright in bed.    Transfers                 General transfer comment: Not tested today as patient requiring extensive  assist with bed mobility  Ambulation/Gait                 Stairs             Wheelchair Mobility    Modified Rankin (Stroke Patients Only)       Balance                                            Cognition Arousal/Alertness: Awake/alert Behavior During Therapy: Flat affect;WFL for tasks assessed/performed (somewhat  mildly aggressive, swatting CNA whenperforms pericare.) Overall Cognitive Status: Impaired/Different from baseline Area of Impairment: Orientation;Attention;Following commands;Awareness                   Current Attention Level: Focused Memory: Decreased short-term memory Following Commands: Follows one step commands with increased time Safety/Judgement: Decreased awareness of deficits Awareness: Intellectual Problem Solving: Slow processing;Decreased initiation;Requires verbal cues;Requires tactile cues;Difficulty sequencing General Comments: pt requires multimodal cues and re-direction for participating in rolling      Exercises General Exercises - Lower Extremity Ankle Circles/Pumps: AAROM;Both;10 reps;Supine Quad Sets: AROM;Both;10 reps;Supine Short Arc Quad: AROM;AAROM;Both;10 reps;Supine    General Comments        Pertinent Vitals/Pain Faces Pain Scale: Hurts whole lot Pain Location: perianal area with pericare due to BM Pain Descriptors / Indicators: Grimacing;Moaning Pain Intervention(s): Monitored during session    Home Living  Prior Function            PT Goals (current goals can now be found in the care plan section) Progress towards PT goals: Not progressing toward goals - comment (requires extensive assistance, very little initiation)    Frequency    Min 2X/week      PT Plan Current plan remains appropriate    Co-evaluation              AM-PAC PT "6 Clicks" Mobility   Outcome Measure  Help needed turning from your back to your side while in a flat  bed without using bedrails?: Total Help needed moving from lying on your back to sitting on the side of a flat bed without using bedrails?: Total Help needed moving to and from a bed to a chair (including a wheelchair)?: Total Help needed standing up from a chair using your arms (e.g., wheelchair or bedside chair)?: Total Help needed to walk in hospital room?: Total Help needed climbing 3-5 steps with a railing? : Total 6 Click Score: 6    End of Session   Activity Tolerance: Patient tolerated treatment well Patient left: in bed;with call bell/phone within reach;with bed alarm set;with nursing/sitter in room Nurse Communication: Mobility status;Need for lift equipment PT Visit Diagnosis: Difficulty in walking, not elsewhere classified (R26.2);Muscle weakness (generalized) (M62.81)     Time: 0930-1003 PT Time Calculation (min) (ACUTE ONLY): 33 min  Charges:  $Therapeutic Activity: 23-37 mins                     Blanchard Kelch PT Acute Rehabilitation Services Pager [phone] Office [phone]    Rada Hay 10/31/2020, 10:21 AM
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
## 2020-11-01 NOTE — Plan of Care (Signed)
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
## 2020-11-02 NOTE — Plan of Care (Signed)
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
## 2020-11-04 NOTE — Progress Notes (Signed)
Physical Therapy Treatment Patient Details Name: Julie Morrow MRN: [id number] DOB: 03/26/1951 Today's Date: 11/04/2020    History of Present Illness Pt is a 70 y/o female with PMHx of bipolar disorder, anxiety disorder, depression who presented to the [facility] ED 09/16/2020 and then admitted to [facility] on 09/20/2020. PT ordered/consulted to address and assess recent decline in functional mobility. Patient was modified independnet on 09/22/20 PT eval.  Then while patient was admitted to behavioral health Hospital, pt was assessed by hospitalist team for chief complaint of change in mental status, who advised that patient come to the hospital and be admitted for observation.    PT Comments    Assisted back to bed via Maxi Move with 2 NT present.    Follow Up Recommendations  SNF     Equipment Recommendations  None recommended by PT    Recommendations for Other Services       Precautions / Restrictions Precautions Precautions: Fall Precaution Comments: incontinence/AMS    Mobility  Bed Mobility  Transfers  Maxi Move assist from recliner back to bed      Ambulation/Gait               Stairs             Wheelchair Mobility    Modified Rankin (Stroke Patients Only)       Balance                                            Cognition Arousal/Alertness: Awake/alert;Lethargic (50/50) Behavior During Therapy: Flat affect;WFL for tasks assessed/performed   Area of Impairment: Orientation;Attention;Following commands;Awareness                 Orientation Level: Disoriented to;Place;Situation;Time Current Attention Level: Focused Memory: Decreased short-term memory Following Commands: Follows one step commands with increased time Safety/Judgement: Decreased awareness of deficits Awareness: Intellectual Problem Solving: Slow processing;Decreased initiation;Requires verbal cues;Requires tactile cues;Difficulty sequencing General Comments: pt  requires multimodal cues and re-direction for participating.  gaze 75% of time      Exercises      General Comments        Pertinent Vitals/Pain Pain Assessment: No/denies pain    Home Living                      Prior Function            PT Goals (current goals can now be found in the care plan section) Progress towards PT goals: Progressing toward goals    Frequency    Min 2X/week      PT Plan Current plan remains appropriate    Co-evaluation              AM-PAC PT "6 Clicks" Mobility   Outcome Measure  Help needed turning from your back to your side while in a flat bed without using bedrails?: Total Help needed moving from lying on your back to sitting on the side of a flat bed without using bedrails?: Total Help needed moving to and from a bed to a chair (including a wheelchair)?: Total Help needed standing up from a chair using your arms (e.g., wheelchair or bedside chair)?: Total Help needed to walk in hospital room?: Total Help needed climbing 3-5 steps with a railing? : Total 6 Click Score: 6    End of Session Equipment Utilized During  Treatment: Gait belt Activity Tolerance: Patient tolerated treatment well Patient left: in chair;with call bell/phone within reach Nurse Communication: Mobility status;Need for lift equipment PT Visit Diagnosis: Difficulty in walking, not elsewhere classified (R26.2);Muscle weakness (generalized) (M62.81)     Time: 7902-4097 PT Time Calculation (min) (ACUTE ONLY): 16 min  Charges:  $Therapeutic Activity: 8-22 mins                     {Christol Thetford  PTA Acute  Rehabilitation Services Pager      [phone] Office      [phone]

## 2020-11-04 NOTE — Progress Notes (Signed)
Physical Therapy Treatment Patient Details Name: Julie Morrow MRN: [id number] DOB: November 04, 1950 Today's Date: 11/04/2020    History of Present Illness Pt is a 70 y/o female with PMHx of bipolar disorder, anxiety disorder, depression who presented to the [facility] ED 09/16/2020 and then admitted to [facility] on 09/20/2020. PT ordered/consulted to address and assess recent decline in functional mobility. Patient was modified independnet on 09/22/20 PT eval.  Then while patient was admitted to behavioral health Hospital, pt was assessed by hospitalist team for chief complaint of change in mental status, who advised that patient come to the hospital and be admitted for observation.    PT Comments    Pt is not progressing with her mobility due to poor level of cognition.  Required Total Assist with use of STEDY for OOB to recliner.    Follow Up Recommendations  SNF     Equipment Recommendations  None recommended by PT    Recommendations for Other Services       Precautions / Restrictions Precautions Precautions: Fall Precaution Comments: incontinence/AMS    Mobility  Bed Mobility Overal bed mobility: Needs Assistance   Rolling: Total assist;+2 for physical assistance;+2 for safety/equipment   Supine to sit: Total assist;+2 for physical assistance;+2 for safety/equipment     General bed mobility comments: pt required Total Assist this session due to impaired cognition low level of alertness with 75% gaze.    Transfers Overall transfer level: Needs assistance   Transfers: Sit to/from Stand;Stand Pivot Transfers Sit to Stand: Total assist;+2 physical assistance;+2 safety/equipment         General transfer comment: VERY difficult using STEDY due to decreased level of alertness which did NOT improve with activity.  Ambulation/Gait             General Gait Details: unable   Stairs             Wheelchair Mobility    Modified Rankin (Stroke Patients Only)       Balance                                             Cognition Arousal/Alertness: Awake/alert;Lethargic (50/50) Behavior During Therapy: Flat affect;WFL for tasks assessed/performed   Area of Impairment: Orientation;Attention;Following commands;Awareness                 Orientation Level: Disoriented to;Place;Situation;Time Current Attention Level: Focused Memory: Decreased short-term memory Following Commands: Follows one step commands with increased time Safety/Judgement: Decreased awareness of deficits Awareness: Intellectual Problem Solving: Slow processing;Decreased initiation;Requires verbal cues;Requires tactile cues;Difficulty sequencing General Comments: pt requires multimodal cues and re-direction for participating.  gaze 75% of time      Exercises      General Comments        Pertinent Vitals/Pain Pain Assessment: No/denies pain    Home Living                      Prior Function            PT Goals (current goals can now be found in the care plan section) Progress towards PT goals: Progressing toward goals    Frequency    Min 2X/week      PT Plan Current plan remains appropriate    Co-evaluation              AM-PAC PT "6 Clicks"  Mobility   Outcome Measure  Help needed turning from your back to your side while in a flat bed without using bedrails?: Total Help needed moving from lying on your back to sitting on the side of a flat bed without using bedrails?: Total Help needed moving to and from a bed to a chair (including a wheelchair)?: Total Help needed standing up from a chair using your arms (e.g., wheelchair or bedside chair)?: Total Help needed to walk in hospital room?: Total Help needed climbing 3-5 steps with a railing? : Total 6 Click Score: 6    End of Session Equipment Utilized During Treatment: Gait belt Activity Tolerance: Patient tolerated treatment well Patient left: in chair;with call bell/phone  within reach Nurse Communication: Mobility status;Need for lift equipment PT Visit Diagnosis: Difficulty in walking, not elsewhere classified (R26.2);Muscle weakness (generalized) (M62.81)     Time: 0349-1791 PT Time Calculation (min) (ACUTE ONLY): 28 min  Charges:  $Therapeutic Activity: 23-37 mins                     {Nicoli Nardozzi  PTA Acute  Rehabilitation Services Pager      [phone] Office      [phone]

## 2020-11-04 NOTE — TOC Progression Note (Signed)
Transition of Care [facility]) - Progression Note    Patient Details  Name: Julie Morrow MRN: [id number] Date of Birth: [birth year]  [facility] CM/SW Contact  Amada Jupiter, LCSW Phone Number: 11/04/2020, 2:21 PM  Clinical Narrative:     TOC continues to follow and still attempting to secure SNF.  Search is out in 50 mile radius but no acceptances.  Have reached out to facilities who have not responded as well.    Expected Discharge Plan: Skilled Nursing Facility Barriers to Discharge: SNF Pending bed offer  Expected Discharge Plan and Services Expected Discharge Plan: Skilled Nursing Facility   Discharge Planning Services: CM Consult Post Acute Care Choice: Skilled Nursing Facility Living arrangements for the past 2 months: Single Family Home                                       Social Determinants of Health (SDOH) Interventions    Readmission Risk Interventions No flowsheet data found.
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
## 2020-11-05 ENCOUNTER — Inpatient Hospital Stay (HOSPITAL_COMMUNITY): Payer: Medicare Other

## 2020-11-05 LAB — COMPREHENSIVE METABOLIC PANEL
ALT: 31 U/L (ref 0–44)
AST: 26 U/L (ref 15–41)
Albumin: 3.6 g/dL (ref 3.5–5.0)
Alkaline Phosphatase: 90 U/L (ref 38–126)
Anion gap: 8 (ref 5–15)
BUN: 16 mg/dL (ref 8–23)
CO2: 31 mmol/L (ref 22–32)
Calcium: 10.1 mg/dL (ref 8.9–10.3)
Chloride: 102 mmol/L (ref 98–111)
Creatinine, Ser: 0.79 mg/dL (ref 0.44–1.00)
GFR, Estimated: >60 mL/min (ref >60)
Glucose, Bld: 120 mg/dL — ABNORMAL HIGH (ref 70–99)
Potassium: 4.1 mmol/L (ref 3.5–5.1)
Sodium: 141 mmol/L (ref 135–145)
Total Bilirubin: 0.4 mg/dL (ref 0.3–1.2)
Total Protein: 7 g/dL (ref 6.5–8.1)

## 2020-11-05 LAB — URINALYSIS, ROUTINE W REFLEX MICROSCOPIC
Bilirubin Urine: NEGATIVE
Glucose, UA: NEGATIVE mg/dL
Hgb urine dipstick: NEGATIVE
Ketones, ur: NEGATIVE mg/dL
Leukocytes,Ua: NEGATIVE
Nitrite: NEGATIVE
Protein, ur: NEGATIVE mg/dL
Specific Gravity, Urine: 1.015 (ref 1.005–1.030)
pH: 5 (ref 5.0–8.0)

## 2020-11-05 LAB — CBC
HCT: 43 % (ref 36.0–46.0)
Hemoglobin: 14 g/dL (ref 12.0–15.0)
MCH: 31.2 pg (ref 26.0–34.0)
MCHC: 32.6 g/dL (ref 30.0–36.0)
MCV: 95.8 fL (ref 80.0–100.0)
Platelets: 181 10*3/uL (ref 150–400)
RBC: 4.49 MIL/uL (ref 3.87–5.11)
RDW: 14.3 % (ref 11.5–15.5)
WBC: 6.5 10*3/uL (ref 4.0–10.5)
nRBC: 0 % (ref 0.0–0.2)

## 2020-11-05 NOTE — Plan of Care (Signed)
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
## 2020-11-05 NOTE — TOC Progression Note (Signed)
Transition of Care [facility]) - Progression Note    Patient Details  Name: Julie Morrow MRN: [id number] Date of Birth: 1950-11-28  Transition [facility]) CM/SW Contact  Nalina Yeatman, Meriam Sprague, RN Phone Number: 11/05/2020, 1:27 PM  Clinical Narrative:    Per leadership staff [facility] should be able to take pt for SNF by the end of this week. Auth started with [facility] ref: [id number]. TOC will continue to follow.   Expected Discharge Plan: Skilled Nursing Facility Barriers to Discharge: SNF Pending bed offer  Expected Discharge Plan and Services Expected Discharge Plan: Skilled Nursing Facility   Discharge Planning Services: CM Consult Post Acute Care Choice: Skilled Nursing Facility Living arrangements for the past 2 months: Single Family Home                    Social Determinants of Health (SDOH) Interventions    Readmission Risk Interventions No flowsheet data found.

## 2020-11-06 LAB — CBC
HCT: 44.8 % (ref 36.0–46.0)
Hemoglobin: 14.1 g/dL (ref 12.0–15.0)
MCH: 30.6 pg (ref 26.0–34.0)
MCHC: 31.5 g/dL (ref 30.0–36.0)
MCV: 97.2 fL (ref 80.0–100.0)
Platelets: 166 10*3/uL (ref 150–400)
RBC: 4.61 MIL/uL (ref 3.87–5.11)
RDW: 14.6 % (ref 11.5–15.5)
WBC: 7.6 10*3/uL (ref 4.0–10.5)
nRBC: 0 % (ref 0.0–0.2)

## 2020-11-06 LAB — BASIC METABOLIC PANEL
Anion gap: 11 (ref 5–15)
BUN: 15 mg/dL (ref 8–23)
CO2: 28 mmol/L (ref 22–32)
Calcium: 9.7 mg/dL (ref 8.9–10.3)
Chloride: 101 mmol/L (ref 98–111)
Creatinine, Ser: 0.8 mg/dL (ref 0.44–1.00)
GFR, Estimated: >60 mL/min (ref >60)
Glucose, Bld: 133 mg/dL — ABNORMAL HIGH (ref 70–99)
Potassium: 3.8 mmol/L (ref 3.5–5.1)
Sodium: 140 mmol/L (ref 135–145)

## 2020-11-06 LAB — RESP PANEL BY RT-PCR (FLU A&B, COVID) ARPGX2
Influenza A by PCR: NEGATIVE
Influenza B by PCR: NEGATIVE
SARS Coronavirus 2 by RT PCR: NEGATIVE

## 2020-11-06 MED ORDER — LEVOTHYROXINE SODIUM 150 MCG PO TABS: 150.0000 ug | ORAL_TABLET | Freq: Every day | ORAL | Status: AC

## 2020-11-06 MED ORDER — ADULT MULTIVITAMIN W/MINERALS CH
1.0000 | ORAL_TABLET | Freq: Every day | ORAL | Status: DC
  Administered 2020-11-06 – 2020-11-07 (×2): 1 via ORAL
  Filled 2020-11-06: qty 1

## 2020-11-06 MED ORDER — ENSURE ENLIVE PO LIQD
237.0000 mL | Freq: Three times a day (TID) | ORAL | Status: DC
  Administered 2020-11-07: 237 mL via ORAL

## 2020-11-06 MED ORDER — BUPROPION HCL ER (XL) 150 MG PO TB24: 300.0000 mg | ORAL_TABLET | Freq: Every day | ORAL | Status: AC

## 2020-11-06 MED ORDER — RISPERIDONE 2 MG PO TABS: 2.0000 mg | ORAL_TABLET | Freq: Every day | ORAL | Status: AC

## 2020-11-06 MED ORDER — BENZTROPINE MESYLATE 0.5 MG PO TABS: 0.5000 mg | ORAL_TABLET | Freq: Two times a day (BID) | ORAL | Status: DC
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
## 2020-11-06 NOTE — Progress Notes (Signed)
IO cath done for UA. Drained 1300 ml of urine.

## 2020-11-06 NOTE — TOC Progression Note (Signed)
Transition of Care [facility]) - Progression Note    Patient Details  Name: Julie Morrow MRN: [id number] Date of Birth: 07-Sep-1950  Transition of Care [facility]) CM/SW Contact  Ida Rogue, [place] Phone Number: 11/06/2020, 9:49 AM  Clinical Narrative:   Received authorization for patient from [place] [id number], 6/23-6/27. Spoke with Kenney Houseman at [facility].  They can receive patient tomorrow. Will need Covid test.  MD alerted. TOC will continue to follow during the course of hospitalization.     Expected Discharge Plan: Skilled Nursing Facility Barriers to Discharge: SNF Pending bed offer  Expected Discharge Plan and Services Expected Discharge Plan: Skilled Nursing Facility   Discharge Planning Services: CM Consult Post Acute Care Choice: Skilled Nursing Facility Living arrangements for the past 2 months: Single Family Home                                       Social Determinants of Health (SDOH) Interventions    Readmission Risk Interventions No flowsheet data found.

## 2020-11-06 NOTE — Progress Notes (Signed)
PT Cancellation Note  Patient Details Name: Julie Morrow MRN: [id number] DOB: [birth year]   Cancelled Treatment:    Reason Eval/Treat Not Completed: Other (comment) (asleep). Pt resting soundly, doesn't waken to name called. Will continue PT efforts.    Domenick Bookbinder PT, DPT 11/06/20, 3:38 PM

## 2020-11-06 NOTE — Progress Notes (Signed)
Nutrition Follow-up  DOCUMENTATION CODES:  Morbid obesity  INTERVENTION:  Recommend advancing diet to Dysphagia 1 with thin liquids.  Discontinue Boost Breeze, Ensure Max, and liquid MVI.  Add Ensure Enlive po TID, each supplement provides 350 kcal and 20 grams of protein.  Add MVI with minerals daily.  NUTRITION DIAGNOSIS:  Inadequate oral intake related to dysphagia as evidenced by per patient/family report. - ongoing  GOAL:  Patient will meet greater than or equal to 90% of their needs - not meeting given CLD for last week  MONITOR:  PO intake, Supplement acceptance, Labs, Weight trends, I & O's  REASON FOR ASSESSMENT:  Malnutrition Screening Tool    ASSESSMENT:  70 y.o. female with a past medical history of bipolar 1 disorder, type 2 diabetes, hypothyroidism, hypertension, hyperlipidemia who initially presented to [facility] ED on 5/3 with severe depression and suicidal ideation. 5/6 - transferred to [facility] 5/12 - sent to [facility] ED for confusion/falls; d/c'd and sent back to [facility] 5/13 - OT recommended d/c to SNF; transferred to [facility] from [facility] for FTT/confusion  Medically stable - pending discharge to SNF. Difficult placement.  Pt on clear liquids for at least a week. Pt is clear to have Dysphagia 1 diet by SLP. Recommend advancing diet. Spoke to MD - MD in agreement. RD to advance diet to Dysphagia 1.  Admit wt: 106 kg No recent weight. Pt due for new weight.  Recommend discontinuing Ensure Max and Boost Breeze given good control of blood glucose and A1c and discontinue liquid MVI. Recommend adding Ensure Enlive TID and MVI with minerals.   Medications: reviewed; Depakote, BB TID, Synthroid, liquid MVI, Ensure Max BID, Senokot  Labs: reviewed; Glucose 133 HbA1c: 6.5% (09/2020)  Diet Order:   Diet Order             Diet clear liquid Room service appropriate? Yes; Fluid consistency: Thin  Diet effective now                  EDUCATION NEEDS:  No education needs have been  identified at this time  Skin:  Skin Assessment: Skin Integrity Issues: Skin Integrity Issues:: Other (Comment) Other: MASD mid sacrum  Last BM:  11/02/20 - Type 5, small  Height:  Ht Readings from Last 1 Encounters:  10/07/20 5\' 1"  (1.549 m)   Weight:  Wt Readings from Last 1 Encounters:  10/07/20 106 kg   BMI:  Body mass index is 44.15 kg/m.  Estimated Nutritional Needs:  Kcal:  1600-1800 Protein:  70-85g Fluid:  1.8L/day  10/09/20, RD, LDN (she/her/hers) Registered Dietitian I After-Hours/Weekend Pager # in [place]

## 2020-11-07 DIAGNOSIS — B372 Candidiasis of skin and nail: Secondary | ICD-10-CM | POA: Diagnosis not present

## 2020-11-07 DIAGNOSIS — R404 Transient alteration of awareness: Secondary | ICD-10-CM | POA: Diagnosis not present

## 2020-11-07 DIAGNOSIS — N390 Urinary tract infection, site not specified: Secondary | ICD-10-CM | POA: Diagnosis not present

## 2020-11-07 DIAGNOSIS — E079 Disorder of thyroid, unspecified: Secondary | ICD-10-CM | POA: Diagnosis not present

## 2020-11-07 DIAGNOSIS — R627 Adult failure to thrive: Secondary | ICD-10-CM | POA: Diagnosis not present

## 2020-11-07 DIAGNOSIS — E1159 Type 2 diabetes mellitus with other circulatory complications: Secondary | ICD-10-CM | POA: Diagnosis not present

## 2020-11-07 DIAGNOSIS — N179 Acute kidney failure, unspecified: Secondary | ICD-10-CM | POA: Diagnosis not present

## 2020-11-07 DIAGNOSIS — G9340 Encephalopathy, unspecified: Secondary | ICD-10-CM | POA: Diagnosis not present

## 2020-11-07 DIAGNOSIS — R296 Repeated falls: Secondary | ICD-10-CM | POA: Diagnosis not present

## 2020-11-07 DIAGNOSIS — G9341 Metabolic encephalopathy: Secondary | ICD-10-CM | POA: Diagnosis not present

## 2020-11-07 DIAGNOSIS — W19XXXA Unspecified fall, initial encounter: Secondary | ICD-10-CM | POA: Diagnosis not present

## 2020-11-07 DIAGNOSIS — Z743 Need for continuous supervision: Secondary | ICD-10-CM | POA: Diagnosis not present

## 2020-11-07 DIAGNOSIS — I739 Peripheral vascular disease, unspecified: Secondary | ICD-10-CM | POA: Diagnosis not present

## 2020-11-07 DIAGNOSIS — M6281 Muscle weakness (generalized): Secondary | ICD-10-CM | POA: Diagnosis not present

## 2020-11-07 DIAGNOSIS — L304 Erythema intertrigo: Secondary | ICD-10-CM | POA: Diagnosis not present

## 2020-11-07 DIAGNOSIS — E785 Hyperlipidemia, unspecified: Secondary | ICD-10-CM | POA: Diagnosis not present

## 2020-11-07 DIAGNOSIS — I959 Hypotension, unspecified: Secondary | ICD-10-CM | POA: Diagnosis not present

## 2020-11-07 DIAGNOSIS — J449 Chronic obstructive pulmonary disease, unspecified: Secondary | ICD-10-CM | POA: Diagnosis not present

## 2020-11-07 DIAGNOSIS — R1312 Dysphagia, oropharyngeal phase: Secondary | ICD-10-CM | POA: Diagnosis not present

## 2020-11-07 DIAGNOSIS — K219 Gastro-esophageal reflux disease without esophagitis: Secondary | ICD-10-CM | POA: Diagnosis not present

## 2020-11-07 DIAGNOSIS — Z1159 Encounter for screening for other viral diseases: Secondary | ICD-10-CM | POA: Diagnosis not present

## 2020-11-07 DIAGNOSIS — R5381 Other malaise: Secondary | ICD-10-CM | POA: Diagnosis not present

## 2020-11-07 DIAGNOSIS — E119 Type 2 diabetes mellitus without complications: Secondary | ICD-10-CM | POA: Diagnosis not present

## 2020-11-07 DIAGNOSIS — R5383 Other fatigue: Secondary | ICD-10-CM | POA: Diagnosis not present

## 2020-11-07 NOTE — TOC Transition Note (Signed)
Transition of Care [facility]) - CM/SW Discharge Note   Patient Details  Name: Julie Morrow MRN: [id number] Date of Birth: [birth year]  Transition [facility]) CM/SW Contact:  Ida Rogue, LCSW Phone Number: 11/07/2020, 11:53 AM   Clinical Narrative:   Patient who is stable for discharge will transition to [facility] SNF today.  Family alerted. PTAR arranged.  Nursing, please call report to [phone]. TOC sign off.    Final next level of care: Skilled Nursing Facility Barriers to Discharge: Barriers Resolved   Patient Goals and CMS Choice     Choice offered to / list presented to : Patient  Discharge Placement                       Discharge Plan and Services   Discharge Planning Services: CM Consult Post Acute Care Choice: Skilled Nursing Facility                               Social Determinants of Health (SDOH) Interventions     Readmission Risk Interventions No flowsheet data found.
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
## 2020-11-07 NOTE — Progress Notes (Signed)
Attempted to call report to the admitting facility [facility]. Nurse unable to take report at this time. Gave nurse my telephone number and she will call back.

## 2020-11-10 DIAGNOSIS — R627 Adult failure to thrive: Secondary | ICD-10-CM | POA: Diagnosis not present

## 2020-11-10 DIAGNOSIS — R296 Repeated falls: Secondary | ICD-10-CM | POA: Diagnosis not present

## 2020-11-10 DIAGNOSIS — R5381 Other malaise: Secondary | ICD-10-CM | POA: Diagnosis not present

## 2020-11-10 DIAGNOSIS — R5383 Other fatigue: Secondary | ICD-10-CM | POA: Diagnosis not present

## 2020-11-10 DIAGNOSIS — G9341 Metabolic encephalopathy: Secondary | ICD-10-CM | POA: Diagnosis not present

## 2020-11-12 DIAGNOSIS — W19XXXA Unspecified fall, initial encounter: Secondary | ICD-10-CM | POA: Diagnosis not present

## 2020-11-12 DIAGNOSIS — M6281 Muscle weakness (generalized): Secondary | ICD-10-CM | POA: Diagnosis not present

## 2020-11-12 DIAGNOSIS — R296 Repeated falls: Secondary | ICD-10-CM | POA: Diagnosis not present

## 2020-11-14 DIAGNOSIS — E1159 Type 2 diabetes mellitus with other circulatory complications: Secondary | ICD-10-CM | POA: Diagnosis not present

## 2020-11-14 DIAGNOSIS — L304 Erythema intertrigo: Secondary | ICD-10-CM | POA: Diagnosis not present

## 2020-11-14 DIAGNOSIS — B372 Candidiasis of skin and nail: Secondary | ICD-10-CM | POA: Diagnosis not present

## 2020-11-25 ENCOUNTER — Encounter
Admission: EM | Disposition: A | Discharge status: Nursing Home (Includes ICF) | Payer: Self-pay | Source: Skilled Nursing Facility | Attending: Internal Medicine

## 2020-11-25 ENCOUNTER — Encounter: Payer: Self-pay | Admitting: Radiology

## 2020-11-25 ENCOUNTER — Emergency Department: Payer: Medicare Other

## 2020-11-25 ENCOUNTER — Inpatient Hospital Stay: Payer: Medicare Other | Admitting: Anesthesiology

## 2020-11-25 ENCOUNTER — Inpatient Hospital Stay
Admission: EM | Admit: 2020-11-25 | Discharge: 2020-12-02 | DRG: 368 | Disposition: A | Discharge status: Other Acute Inpatient Hospital | Payer: Medicare Other | Source: Skilled Nursing Facility | Attending: Internal Medicine | Admitting: Internal Medicine

## 2020-11-25 DIAGNOSIS — E1151 Type 2 diabetes mellitus with diabetic peripheral angiopathy without gangrene: Secondary | ICD-10-CM | POA: Diagnosis not present

## 2020-11-25 DIAGNOSIS — E876 Hypokalemia: Secondary | ICD-10-CM | POA: Diagnosis present

## 2020-11-25 DIAGNOSIS — Z20822 Contact with and (suspected) exposure to covid-19: Secondary | ICD-10-CM | POA: Diagnosis not present

## 2020-11-25 DIAGNOSIS — Z90710 Acquired absence of both cervix and uterus: Secondary | ICD-10-CM

## 2020-11-25 DIAGNOSIS — K226 Gastro-esophageal laceration-hemorrhage syndrome: Principal | ICD-10-CM | POA: Diagnosis present

## 2020-11-25 DIAGNOSIS — K922 Gastrointestinal hemorrhage, unspecified: Secondary | ICD-10-CM

## 2020-11-25 DIAGNOSIS — F419 Anxiety disorder, unspecified: Secondary | ICD-10-CM | POA: Diagnosis present

## 2020-11-25 DIAGNOSIS — Z7189 Other specified counseling: Secondary | ICD-10-CM | POA: Diagnosis not present

## 2020-11-25 DIAGNOSIS — J9601 Acute respiratory failure with hypoxia: Secondary | ICD-10-CM | POA: Diagnosis not present

## 2020-11-25 DIAGNOSIS — R410 Disorientation, unspecified: Secondary | ICD-10-CM | POA: Diagnosis not present

## 2020-11-25 DIAGNOSIS — Z6841 Body Mass Index (BMI) 40.0 and over, adult: Secondary | ICD-10-CM | POA: Diagnosis not present

## 2020-11-25 DIAGNOSIS — R279 Unspecified lack of coordination: Secondary | ICD-10-CM | POA: Diagnosis not present

## 2020-11-25 DIAGNOSIS — Z9981 Dependence on supplemental oxygen: Secondary | ICD-10-CM | POA: Diagnosis not present

## 2020-11-25 DIAGNOSIS — Z7982 Long term (current) use of aspirin: Secondary | ICD-10-CM | POA: Diagnosis not present

## 2020-11-25 DIAGNOSIS — Z79890 Hormone replacement therapy (postmenopausal): Secondary | ICD-10-CM

## 2020-11-25 DIAGNOSIS — F17210 Nicotine dependence, cigarettes, uncomplicated: Secondary | ICD-10-CM | POA: Diagnosis not present

## 2020-11-25 DIAGNOSIS — F3160 Bipolar disorder, current episode mixed, unspecified: Secondary | ICD-10-CM | POA: Diagnosis present

## 2020-11-25 DIAGNOSIS — F411 Generalized anxiety disorder: Secondary | ICD-10-CM | POA: Diagnosis present

## 2020-11-25 DIAGNOSIS — Z743 Need for continuous supervision: Secondary | ICD-10-CM | POA: Diagnosis not present

## 2020-11-25 DIAGNOSIS — E785 Hyperlipidemia, unspecified: Secondary | ICD-10-CM | POA: Diagnosis not present

## 2020-11-25 DIAGNOSIS — N179 Acute kidney failure, unspecified: Secondary | ICD-10-CM | POA: Diagnosis present

## 2020-11-25 DIAGNOSIS — R404 Transient alteration of awareness: Secondary | ICD-10-CM | POA: Diagnosis not present

## 2020-11-25 DIAGNOSIS — Z79899 Other long term (current) drug therapy: Secondary | ICD-10-CM

## 2020-11-25 DIAGNOSIS — E119 Type 2 diabetes mellitus without complications: Secondary | ICD-10-CM

## 2020-11-25 DIAGNOSIS — E039 Hypothyroidism, unspecified: Secondary | ICD-10-CM | POA: Diagnosis not present

## 2020-11-25 DIAGNOSIS — R0902 Hypoxemia: Secondary | ICD-10-CM | POA: Diagnosis not present

## 2020-11-25 DIAGNOSIS — F0390 Unspecified dementia without behavioral disturbance: Secondary | ICD-10-CM | POA: Diagnosis present

## 2020-11-25 DIAGNOSIS — I517 Cardiomegaly: Secondary | ICD-10-CM | POA: Diagnosis not present

## 2020-11-25 DIAGNOSIS — Z515 Encounter for palliative care: Secondary | ICD-10-CM | POA: Diagnosis not present

## 2020-11-25 DIAGNOSIS — R1111 Vomiting without nausea: Secondary | ICD-10-CM | POA: Diagnosis not present

## 2020-11-25 DIAGNOSIS — R0689 Other abnormalities of breathing: Secondary | ICD-10-CM | POA: Diagnosis not present

## 2020-11-25 DIAGNOSIS — K920 Hematemesis: Secondary | ICD-10-CM

## 2020-11-25 DIAGNOSIS — K219 Gastro-esophageal reflux disease without esophagitis: Secondary | ICD-10-CM | POA: Diagnosis not present

## 2020-11-25 DIAGNOSIS — J690 Pneumonitis due to inhalation of food and vomit: Secondary | ICD-10-CM | POA: Diagnosis not present

## 2020-11-25 DIAGNOSIS — R001 Bradycardia, unspecified: Secondary | ICD-10-CM | POA: Diagnosis not present

## 2020-11-25 DIAGNOSIS — J9690 Respiratory failure, unspecified, unspecified whether with hypoxia or hypercapnia: Secondary | ICD-10-CM | POA: Diagnosis not present

## 2020-11-25 DIAGNOSIS — R627 Adult failure to thrive: Secondary | ICD-10-CM | POA: Diagnosis not present

## 2020-11-25 DIAGNOSIS — R6889 Other general symptoms and signs: Secondary | ICD-10-CM | POA: Diagnosis not present

## 2020-11-25 DIAGNOSIS — R0609 Other forms of dyspnea: Secondary | ICD-10-CM | POA: Diagnosis not present

## 2020-11-25 HISTORY — PX: ESOPHAGOGASTRODUODENOSCOPY (EGD) WITH PROPOFOL: SHX5813

## 2020-11-25 LAB — HEMOGLOBIN AND HEMATOCRIT, BLOOD
HCT: 46.9 % — ABNORMAL HIGH (ref 36.0–46.0)
HCT: 41.5 % (ref 36.0–46.0)
Hemoglobin: 15.6 g/dL — ABNORMAL HIGH (ref 12.0–15.0)
Hemoglobin: 13 g/dL (ref 12.0–15.0)

## 2020-11-25 LAB — CBC WITH DIFFERENTIAL/PLATELET
Abs Immature Granulocytes: 0.04 10*3/uL (ref 0.00–0.07)
Basophils Absolute: 0.1 10*3/uL (ref 0.0–0.1)
Basophils Relative: 0 %
Eosinophils Absolute: 0.1 10*3/uL (ref 0.0–0.5)
Eosinophils Relative: 1 %
HCT: 49.5 % — ABNORMAL HIGH (ref 36.0–46.0)
Hemoglobin: 15.8 g/dL — ABNORMAL HIGH (ref 12.0–15.0)
Immature Granulocytes: 0 %
Lymphocytes Relative: 10 %
Lymphs Abs: 1.1 10*3/uL (ref 0.7–4.0)
MCH: 31.1 pg (ref 26.0–34.0)
MCHC: 31.9 g/dL (ref 30.0–36.0)
MCV: 97.4 fL (ref 80.0–100.0)
Monocytes Absolute: 0.7 10*3/uL (ref 0.1–1.0)
Monocytes Relative: 6 %
Neutro Abs: 9.3 10*3/uL — ABNORMAL HIGH (ref 1.7–7.7)
Neutrophils Relative %: 83 %
Platelets: 234 10*3/uL (ref 150–400)
RBC: 5.08 MIL/uL (ref 3.87–5.11)
RDW: 14.8 % (ref 11.5–15.5)
WBC: 11.4 10*3/uL — ABNORMAL HIGH (ref 4.0–10.5)
nRBC: 0 % (ref 0.0–0.2)

## 2020-11-25 LAB — MRSA NEXT GEN BY PCR, NASAL: MRSA by PCR Next Gen: NOT DETECTED

## 2020-11-25 LAB — BASIC METABOLIC PANEL
Anion gap: 9 (ref 5–15)
BUN: 17 mg/dL (ref 8–23)
CO2: 27 mmol/L (ref 22–32)
Calcium: 9.2 mg/dL (ref 8.9–10.3)
Chloride: 105 mmol/L (ref 98–111)
Creatinine, Ser: 0.99 mg/dL (ref 0.44–1.00)
GFR, Estimated: >60 mL/min (ref >60)
Glucose, Bld: 182 mg/dL — ABNORMAL HIGH (ref 70–99)
Potassium: 4.3 mmol/L (ref 3.5–5.1)
Sodium: 141 mmol/L (ref 135–145)

## 2020-11-25 LAB — RESP PANEL BY RT-PCR (FLU A&B, COVID) ARPGX2
Influenza A by PCR: NEGATIVE
Influenza B by PCR: NEGATIVE
SARS Coronavirus 2 by RT PCR: NEGATIVE

## 2020-11-25 LAB — PROTIME-INR
INR: 1 (ref 0.8–1.2)
Prothrombin Time: 13.1 seconds (ref 11.4–15.2)

## 2020-11-25 LAB — LACTIC ACID, PLASMA: Lactic Acid, Venous: 1.6 mmol/L (ref 0.5–1.9)

## 2020-11-25 LAB — APTT: aPTT: 29 seconds (ref 24–36)

## 2020-11-25 LAB — PROCALCITONIN: Procalcitonin: 3.08 ng/mL

## 2020-11-25 LAB — BRAIN NATRIURETIC PEPTIDE: B Natriuretic Peptide: 35.5 pg/mL (ref 0.0–100.0)

## 2020-11-25 LAB — MAGNESIUM: Magnesium: 2 mg/dL (ref 1.7–2.4)

## 2020-11-25 LAB — ABO/RH: ABO/RH(D): B POS

## 2020-11-25 SURGERY — ESOPHAGOGASTRODUODENOSCOPY (EGD) WITH PROPOFOL
Anesthesia: General

## 2020-11-25 MED ORDER — ONDANSETRON HCL 4 MG/2ML IJ SOLN
4.0000 mg | Freq: Once | INTRAMUSCULAR | Status: AC
  Administered 2020-11-25: 4 mg via INTRAVENOUS

## 2020-11-25 MED ORDER — BENZTROPINE MESYLATE 0.5 MG PO TABS
0.5000 mg | ORAL_TABLET | Freq: Two times a day (BID) | ORAL | Status: DC
  Administered 2020-11-25 – 2020-12-02 (×14): 0.5 mg via ORAL
  Filled 2020-11-25 (×16): qty 1

## 2020-11-25 MED ORDER — ASPIRIN 81 MG PO CHEW
81.0000 mg | CHEWABLE_TABLET | Freq: Every day | ORAL | Status: DC
  Administered 2020-11-26 – 2020-12-02 (×7): 81 mg via ORAL
  Filled 2020-11-25 (×7): qty 1

## 2020-11-25 MED ORDER — SODIUM CHLORIDE 0.9 % IV SOLN
3.0000 g | Freq: Four times a day (QID) | INTRAVENOUS | Status: DC
  Administered 2020-11-25 – 2020-11-28 (×11): 3 g via INTRAVENOUS
  Filled 2020-11-25: qty 3
  Filled 2020-11-25 (×4): qty 8
  Filled 2020-11-25: qty 3
  Filled 2020-11-25 (×8): qty 8

## 2020-11-25 MED ORDER — LACTATED RINGERS IV BOLUS
1000.0000 mL | Freq: Once | INTRAVENOUS | Status: AC
  Administered 2020-11-25: 1000 mL via INTRAVENOUS

## 2020-11-25 MED ORDER — PROPOFOL 10 MG/ML IV BOLUS
INTRAVENOUS | Status: DC | PRN
  Administered 2020-11-25: 60 mg via INTRAVENOUS

## 2020-11-25 MED ORDER — SODIUM CHLORIDE 0.9 % IV SOLN
3.0000 g | Freq: Four times a day (QID) | INTRAVENOUS | Status: DC
  Administered 2020-11-25: 3 g via INTRAVENOUS
  Filled 2020-11-25: qty 8
  Filled 2020-11-25: qty 3
  Filled 2020-11-25: qty 8

## 2020-11-25 MED ORDER — PANTOPRAZOLE INFUSION (NEW) - SIMPLE MED
8.0000 mg/h | INTRAVENOUS | Status: DC
  Administered 2020-11-25 – 2020-11-26 (×4): 8 mg/h via INTRAVENOUS
  Filled 2020-11-25: qty 80
  Filled 2020-11-25 (×3): qty 100

## 2020-11-25 MED ORDER — ONDANSETRON HCL 4 MG PO TABS: 4.0000 mg | ORAL_TABLET | Freq: Four times a day (QID) | ORAL | Status: DC | PRN

## 2020-11-25 MED ORDER — DIVALPROEX SODIUM ER 500 MG PO TB24
750.0000 mg | ORAL_TABLET | Freq: Every day | ORAL | Status: DC
  Administered 2020-11-25 – 2020-12-01 (×7): 750 mg via ORAL
  Filled 2020-11-25 (×8): qty 1

## 2020-11-25 MED ORDER — PANTOPRAZOLE SODIUM 40 MG IV SOLR: 40.0000 mg | Freq: Two times a day (BID) | INTRAVENOUS | Status: DC

## 2020-11-25 MED ORDER — PANTOPRAZOLE 80MG IVPB - SIMPLE MED
80.0000 mg | Freq: Once | INTRAVENOUS | Status: AC
  Administered 2020-11-25: 80 mg via INTRAVENOUS
  Filled 2020-11-25: qty 80

## 2020-11-25 MED ORDER — SODIUM CHLORIDE 0.9 % IV SOLN
3.0000 g | Freq: Once | INTRAVENOUS | Status: AC
  Administered 2020-11-25: 3 g via INTRAVENOUS
  Filled 2020-11-25: qty 8

## 2020-11-25 MED ORDER — FUROSEMIDE 10 MG/ML IJ SOLN
40.0000 mg | Freq: Once | INTRAMUSCULAR | Status: AC
  Administered 2020-11-25: 40 mg via INTRAVENOUS
  Filled 2020-11-25: qty 4

## 2020-11-25 MED ORDER — SODIUM CHLORIDE 0.9 % IV SOLN: INTRAVENOUS | Status: DC | PRN

## 2020-11-25 MED ORDER — SODIUM CHLORIDE 0.9 % IV SOLN: INTRAVENOUS | Status: AC

## 2020-11-25 MED ORDER — RISPERIDONE 1 MG PO TABS
2.0000 mg | ORAL_TABLET | Freq: Every day | ORAL | Status: DC
  Administered 2020-11-25 – 2020-12-01 (×7): 2 mg via ORAL
  Filled 2020-11-25 (×8): qty 2

## 2020-11-25 MED ORDER — MORPHINE SULFATE (PF) 2 MG/ML IV SOLN: 2.0000 mg | INTRAVENOUS | Status: DC | PRN

## 2020-11-25 MED ORDER — ZINC OXIDE 40 % EX OINT
TOPICAL_OINTMENT | Freq: Two times a day (BID) | CUTANEOUS | Status: DC
  Administered 2020-11-26 – 2020-11-29 (×4): 1 via TOPICAL
  Filled 2020-11-25 (×2): qty 113

## 2020-11-25 MED ORDER — BUPROPION HCL ER (XL) 150 MG PO TB24
300.0000 mg | ORAL_TABLET | Freq: Every day | ORAL | Status: DC
  Administered 2020-11-26 – 2020-12-02 (×7): 300 mg via ORAL
  Filled 2020-11-25 (×7): qty 2

## 2020-11-25 MED ORDER — SODIUM CHLORIDE 0.9 % IV SOLN
10.0000 mL/h | Freq: Once | INTRAVENOUS | Status: AC
  Administered 2020-11-25: 10 mL/h via INTRAVENOUS

## 2020-11-25 MED ORDER — IPRATROPIUM-ALBUTEROL 0.5-2.5 (3) MG/3ML IN SOLN
3.0000 mL | Freq: Three times a day (TID) | RESPIRATORY_TRACT | Status: DC
  Administered 2020-11-25 – 2020-12-02 (×21): 3 mL via RESPIRATORY_TRACT
  Filled 2020-11-25 (×21): qty 3

## 2020-11-25 MED ORDER — LEVOTHYROXINE SODIUM 50 MCG PO TABS
150.0000 ug | ORAL_TABLET | Freq: Every day | ORAL | Status: DC
  Administered 2020-11-26 – 2020-12-02 (×6): 150 ug via ORAL
  Filled 2020-11-25 (×6): qty 1

## 2020-11-25 MED ORDER — MELATONIN 5 MG PO TABS
5.0000 mg | ORAL_TABLET | Freq: Every day | ORAL | Status: DC
  Administered 2020-11-25 – 2020-12-01 (×7): 5 mg via ORAL
  Filled 2020-11-25 (×7): qty 1

## 2020-11-25 MED ORDER — ONDANSETRON HCL 4 MG/2ML IJ SOLN: 4.0000 mg | Freq: Four times a day (QID) | INTRAMUSCULAR | Status: DC | PRN

## 2020-11-25 NOTE — ED Notes (Signed)
This RN transported the patient to the floor while on a monitor.

## 2020-11-25 NOTE — Op Note (Signed)
[facility] Gastroenterology Patient Name: Julie Morrow Procedure Date: 11/25/2020 10:43 AM MRN: [id number] Account #: [id number] Date of Birth: [birth year] Admit Type: Inpatient Age: 70 Room: [facility] ENDO ROOM 4 Gender: Female Note Status: Finalized Procedure:             Upper GI endoscopy Indications:           Hematemesis Providers:             Midge Minium MD, MD Referring MD:          No Local Md, MD (Referring MD) Medicines:             Propofol per Anesthesia Complications:         No immediate complications. Procedure:             Pre-Anesthesia Assessment:                        - Prior to the procedure, a History and Physical was                         performed, and patient medications and allergies were                         reviewed. The patient's tolerance of previous                         anesthesia was also reviewed. The risks and benefits                         of the procedure and the sedation options and risks                         were discussed with the patient. All questions were                         answered, and informed consent was obtained. Prior                         Anticoagulants: The patient has taken no previous                         anticoagulant or antiplatelet agents. ASA Grade                         Assessment: II - A patient with mild systemic disease.                         After reviewing the risks and benefits, the patient                         was deemed in satisfactory condition to undergo the                         procedure.                        After obtaining informed consent, the endoscope was  passed under direct vision. Throughout the procedure,                         the patient's blood pressure, pulse, and oxygen                         saturations were monitored continuously. The Endoscope                         was introduced through the mouth, and advanced to the                          second part of duodenum. The upper GI endoscopy was                         accomplished without difficulty. The patient tolerated                         the procedure well. Findings:      A non-bleeding Mallory-Weiss tear with no stigmata of recent bleeding       was found.      The stomach was normal.      The examined duodenum was normal. Impression:            - Mallory-Weiss tear.                        - Normal stomach.                        - Normal examined duodenum.                        - No specimens collected. Recommendation:        - Return patient to hospital ward for ongoing care.                        - Resume previous diet.                        - Continue present medications. 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 All rights reserved. The codes documented in this report are preliminary and upon coder review may  be revised to meet current compliance requirements. Midge Minium MD, MD 11/25/2020 11:39:27 AM This report has been signed  electronically. Number of Addenda: 0 Note Initiated On: 11/25/2020 10:43 AM Estimated Blood Loss:  Estimated blood loss: none.      [facility]

## 2020-11-25 NOTE — Anesthesia Postprocedure Evaluation (Signed)
Anesthesia Post Note  Patient: Julie Morrow  Procedure(s) Performed: ESOPHAGOGASTRODUODENOSCOPY (EGD) WITH PROPOFOL  Patient location during evaluation: Endoscopy Anesthesia Type: General Level of consciousness: awake and alert Pain management: pain level controlled Vital Signs Assessment: post-procedure vital signs reviewed and stable Respiratory status: spontaneous breathing, nonlabored ventilation, respiratory function stable and patient connected to nasal cannula oxygen Cardiovascular status: blood pressure returned to baseline and stable Postop Assessment: no apparent nausea or vomiting Anesthetic complications: no   No notable events documented.   Last Vitals:  Vitals:   11/25/20 1143 11/25/20 1224  BP: 101/68 105/74  Pulse:  79  Resp: (!) 33 16  Temp:  36.8 C  SpO2: 96% 95%    Last Pain:  Vitals:   11/25/20 1224  TempSrc: Axillary  PainSc:                  Cleda Mccreedy Tymeka Privette

## 2020-11-25 NOTE — Transfer of Care (Signed)
Immediate Anesthesia Transfer of Care Note  Patient: Julie Morrow  Procedure(s) Performed: ESOPHAGOGASTRODUODENOSCOPY (EGD) WITH PROPOFOL  Patient Location: PACU and Endoscopy Unit  Anesthesia Type:General  Level of Consciousness: drowsy and patient cooperative  Airway & Oxygen Therapy: Patient Spontanous Breathing and Patient connected to face mask oxygen  Post-op Assessment: Report given to RN and Post -op Vital signs reviewed and stable  Post vital signs: Reviewed and stable  Last Vitals:  Vitals Value Taken Time  BP 101/68 11/25/20 1143  Temp    Pulse 99 11/25/20 1144  Resp 25 11/25/20 1144  SpO2 95 % 11/25/20 1144  Vitals shown include unvalidated device data.  Last Pain:  Vitals:   11/25/20 1130  TempSrc: Temporal         Complications: No notable events documented.

## 2020-11-25 NOTE — Plan of Care (Signed)
  Problem: Education: Goal: Knowledge of General Education information will improve Description: Including pain rating scale, medication(s)/side effects and non-pharmacologic comfort measures Outcome: Not Progressing   Problem: Health Behavior/Discharge Planning: Goal: Ability to manage health-related needs will improve Outcome: Not Progressing   Problem: Clinical Measurements: Goal: Ability to maintain clinical measurements within normal limits will improve Outcome: Not Progressing Goal: Will remain free from infection Outcome: Not Progressing Goal: Diagnostic test results will improve Outcome: Not Progressing Goal: Respiratory complications will improve Outcome: Not Progressing Goal: Cardiovascular complication will be avoided Outcome: Not Progressing   Problem: Activity: Goal: Risk for activity intolerance will decrease Outcome: Not Progressing   Problem: Nutrition: Goal: Adequate nutrition will be maintained Outcome: Not Progressing   Problem: Coping: Goal: Level of anxiety will decrease Outcome: Not Progressing   Problem: Elimination: Goal: Will not experience complications related to bowel motility Outcome: Not Progressing Goal: Will not experience complications related to urinary retention Outcome: Not Progressing   Problem: Pain Managment: Goal: General experience of comfort will improve Outcome: Not Progressing   Problem: Safety: Goal: Ability to remain free from injury will improve Outcome: Not Progressing   Problem: Skin Integrity: Goal: Risk for impaired skin integrity will decrease Outcome: Not Progressing   Problem: Education: Goal: Ability to identify signs and symptoms of gastrointestinal bleeding will improve Outcome: Not Progressing   Problem: Bowel/Gastric: Goal: Will show no signs and symptoms of gastrointestinal bleeding Outcome: Not Progressing   Problem: Fluid Volume: Goal: Will show no signs and symptoms of excessive  bleeding Outcome: Not Progressing   Problem: Clinical Measurements: Goal: Complications related to the disease process, condition or treatment will be avoided or minimized Outcome: Not Progressing   

## 2020-11-25 NOTE — Consult Note (Signed)
WOC Nurse Consult Note: Reason for Consult: Consult requested for sacrum.  Pt is fmiliar to [facility] team from recent admission on 6/9. Wound type: Pt is frequently incontinent of urine and has patchy areas of red most full thickness skin loss across sacrum; affected area is approx 4X4X.2cm Pressure Injury POA: These wounds were present on admission and are not pressure injuries. Dressing procedure/placement/frequency: Topical treatment orders provided for bedside nurses as follows to protect skin and repel moisture: Apply to buttocks BID and PRN when turning or cleaning.  Leave foam dressing off, since it was trapping urine underneath. Please re-consult if further assistance is needed.  Thank-you,  Cammie Mcgee MSN, RN, CWOCN, [place], CNS [phone]

## 2020-11-25 NOTE — ED Provider Notes (Signed)
[facility] Emergency Department Provider Note  ____________________________________________  Time seen: Approximately 2:49 AM  I have reviewed the triage vital signs and the nursing notes.   HISTORY  Chief Complaint GI Problem (GI Bleed)  Level 5 caveat:  Portions of the history and physical were unable to be obtained due to encephalopathy   HPI Julie Morrow is a 70 y.o. female with a history of bipolar, diabetes, depression, anxiety who presents from her nursing home for coffee-ground emesis.  Patient with an episode of aspiration at the nursing home.  Found hypoxic in the 50s per EMS.  Covered in coffee-ground emesis.  Patient unable to provide any history due to encephalopathy/dementia.  Patient does deny any abdominal pain.  Patient is not on blood thinners but she is on aspirin.  Past Medical History:  Diagnosis Date   Anxiety    Bipolar 1 disorder (HCC)    Bradycardia    Depression    Diabetes mellitus, type II (HCC)    Patient takes Glucotrol and Januvia   Hypothyroidism    Lithium toxicity     Patient Active Problem List   Diagnosis Date Noted   Coffee ground emesis 11/25/2020   Acute respiratory failure with hypoxia (HCC) 11/25/2020   Aspiration pneumonia (HCC) 11/25/2020   FTT (failure to thrive) in adult 10/08/2020   Depression 10/07/2020   Thyroid disease 10/07/2020   Hyperlipidemia 10/07/2020   Failure to thrive in adult 10/07/2020   Acute encephalopathy 10/07/2020   Major neurocognitive disorder (HCC) 09/27/2020   Diabetes (HCC) 09/23/2020   Bipolar disorder, mixed (HCC) 09/19/2020   Adjustment disorder with anxiety 09/13/2020   Family discord 12/05/2019   AMS (altered mental status) 05/14/2019   Acute lower UTI 05/14/2019   Adjustment disorder with mixed disturbance of emotions and conduct    Bipolar I disorder with mania (HCC) 12/27/2018   PAD (peripheral artery disease) (HCC) 10/16/2018   GAD (generalized anxiety  disorder) 03/04/2018   Insomnia 03/04/2018   Situational depression 03/04/2018   Bipolar depression (HCC) 03/04/2018   Acute kidney injury (HCC) 05/30/2017   Bradycardia    Lithium toxicity    COPD GOLD 0 / still smoking  12/22/2016   Generalized weakness 10/27/2015   GERD (gastroesophageal reflux disease) 10/15/2015   Bipolar 1 disorder, mixed, severe (HCC) 10/11/2015   Diabetes type 2, controlled (HCC) 07/07/2015   Morbid (severe) obesity due to excess calories (HCC) 07/07/2015   Cigarette smoker 07/07/2015   Hypothyroidism 07/07/2015   HLD (hyperlipidemia) 11/07/2014   Psoriasis 11/07/2014    Past Surgical History:  Procedure Laterality Date   ABDOMINAL HYSTERECTOMY     CHOLECYSTECTOMY      Prior to Admission medications   Medication Sig Start Date End Date Taking? Authorizing Provider  aspirin 81 MG chewable tablet Chew 1 tablet (81 mg total) by mouth daily. 01/05/19   Clapacs, Jackquline DenmarkJohn T, MD  benztropine (COGENTIN) 0.5 MG tablet Take 1 tablet (0.5 mg total) by mouth 2 (two) times daily. 11/06/20   Leatha GildingGherghe, Costin M, MD  buPROPion (WELLBUTRIN XL) 150 MG 24 hr tablet Take 2 tablets (300 mg total) by mouth daily. 11/06/20   Leatha GildingGherghe, Costin M, MD  CINNAMON PO Take 1 tablet by mouth daily.    [provider]  divalproex (DEPAKOTE ER) 250 MG 24 hr tablet Take 750 mg by mouth at bedtime. 10/01/19   [provider]  fenofibrate 160 MG tablet Take 1 tablet (160 mg total) by mouth daily with  breakfast. 01/05/19   Clapacs, Jackquline Denmark, MD  FEROSUL 325 (65 Fe) MG tablet Take 325 mg by mouth daily. 10/01/19   [provider]  levothyroxine (SYNTHROID) 150 MCG tablet Take 1 tablet (150 mcg total) by mouth daily. 11/07/20   Leatha Gilding, MD  losartan (COZAAR) 25 MG tablet Take 25 mg by mouth daily. 08/26/20   [provider]  melatonin 5 MG TABS Take 5-10 mg by mouth at bedtime.    [provider]  Multiple Vitamins-Minerals (CENTRUM) tablet Take 1 tablet  by mouth daily.    [provider]  polyvinyl alcohol (LIQUIFILM TEARS) 1.4 % ophthalmic solution Place 1 drop into both eyes as needed for dry eyes.    [provider]  Probiotic Product (PROBIOTIC PO) Take 1 capsule by mouth daily.    [provider]  risperiDONE (RISPERDAL) 2 MG tablet Take 1 tablet (2 mg total) by mouth at bedtime. 11/06/20   Leatha Gilding, MD  rosuvastatin (CRESTOR) 40 MG tablet Take 40 mg by mouth daily. 12/31/18   [provider]  RYBELSUS 14 MG TABS Take 1 tablet by mouth every morning. 08/26/20   [provider]    Allergies Patient has no known allergies.  Family History  Problem Relation Age of Onset   Dementia Mother    Arthritis Father     Social History Social History   Tobacco Use   Smoking status: Every Day    Packs/day: 0.50    Years: 20.00    Pack years: 10.00    Types: Cigarettes    Start date: 11/06/1984   Smokeless tobacco: Never  Vaping Use   Vaping Use: Never used  Substance Use Topics   Alcohol use: No    Alcohol/week: 0.0 standard drinks   Drug use: No    Review of Systems  Constitutional: Negative for fever. Eyes: Negative for visual changes. ENT: Negative for sore throat. Neck: No neck pain  Cardiovascular: Negative for chest pain. Respiratory: Negative for shortness of breath. Gastrointestinal: Negative for abdominal pain. + vomiting Genitourinary: Negative for dysuria. Musculoskeletal: Negative for back pain. Skin: Negative for rash. Neurological: Negative for headaches, weakness or numbness. Psych: No SI or HI  ____________________________________________   PHYSICAL EXAM:  VITAL SIGNS: ED Triage Vitals  Enc Vitals Group     BP 11/25/20 0136 (!) 185/89     Pulse Rate 11/25/20 0136 81     Resp 11/25/20 0136 (!) 24     Temp 11/25/20 0225 97.9 F (36.6 C)     Temp Source 11/25/20 0225 Axillary     SpO2 11/25/20 0136 99 %     Weight 11/25/20 0151 231 lb 7.7 oz (105  kg)     Height 11/25/20 0151 5\' 1"  (1.549 m)     Head Circumference --      Peak Flow --      Pain Score --      Pain Loc --      Pain Edu? --      Excl. in GC? --     Constitutional: Alert and oriented to self only. Well appearing and in no apparent distress. HEENT:      Head: Normocephalic and atraumatic.         Eyes: Conjunctivae are normal. Sclera is non-icteric.       Mouth/Throat: Mucous membranes are moist.       Neck: Supple with no signs of meningismus. Cardiovascular: Regular rate and rhythm. No murmurs,  gallops, or rubs. 2+ symmetrical distal pulses are present in all extremities. No JVD. Respiratory: Tachypneic and hypoxic on room air, no crackles or wheezing bilateral Gastrointestinal: Soft, non tender, and non distended Genitourinary: Rectal exam showing dark brown stool guaiac negative Musculoskeletal:  No edema, cyanosis, or erythema of extremities. Neurologic: Normal speech and language. Face is symmetric. Moving all extremities. No gross focal neurologic deficits are appreciated. Skin: Skin is warm, dry and intact. No rash noted. Psychiatric: Mood and affect are normal. Speech and behavior are normal.  ____________________________________________   LABS (all labs ordered are listed, but only abnormal results are displayed)  Labs Reviewed  CBC WITH DIFFERENTIAL/PLATELET - Abnormal; Notable for the following components:      Result Value   WBC 11.4 (*)    Hemoglobin 15.8 (*)    HCT 49.5 (*)    Neutro Abs 9.3 (*)    All other components within normal limits  RESP PANEL BY RT-PCR (FLU A&B, COVID) ARPGX2  PROTIME-INR  APTT  LACTIC ACID, PLASMA  TYPE AND SCREEN  PREPARE RBC (CROSSMATCH)  TYPE AND SCREEN   ____________________________________________  EKG  ED ECG REPORT I, Nita Sickle, the attending physician, personally viewed and interpreted this ECG.  Sinus rhythm with a rate of 81, diffuse T wave flattening with no ST elevations or  depressions ____________________________________________  RADIOLOGY  I have personally reviewed the images performed during this visit and I agree with the Radiologist's read.   Interpretation by Radiologist:  DG Chest Portable 1 View  Result Date: 11/25/2020 CLINICAL DATA:  70 year old female with hypoxia. EXAM: PORTABLE CHEST 1 VIEW COMPARISON:  Chest radiograph dated 10/07/2020. FINDINGS: There is cardiomegaly with vascular congestion. No focal consolidation, pleural effusion, pneumothorax. No acute osseous pathology. IMPRESSION: Cardiomegaly with vascular congestion. No focal consolidation. Electronically Signed   By: Elgie Collard M.D.   On: 11/25/2020 02:28     ____________________________________________   PROCEDURES  Procedure(s) performed:yes .1-3 Lead EKG Interpretation  Date/Time: 11/25/2020 2:56 AM Performed by: Nita Sickle, MD Authorized by: Nita Sickle, MD     Interpretation: non-specific     ECG rate assessment: normal     Rhythm: sinus rhythm     Ectopy: none     Conduction: normal    Critical Care performed: yes  CRITICAL CARE Performed by: Nita Sickle  ?  Total critical care time: 30 min  Critical care time was exclusive of separately billable procedures and treating other patients.  Critical care was necessary to treat or prevent imminent or life-threatening deterioration.  Critical care was time spent personally by me on the following activities: development of treatment plan with patient and/or surrogate as well as nursing, discussions with consultants, evaluation of patient's response to treatment, examination of patient, obtaining history from patient or surrogate, ordering and performing treatments and interventions, ordering and review of laboratory studies, ordering and review of radiographic studies, pulse oximetry and re-evaluation of patient's condition.  ____________________________________________   INITIAL  IMPRESSION / ASSESSMENT AND PLAN / ED COURSE  70 y.o. female with a history of bipolar, diabetes, depression, anxiety who presents from her nursing home for coffee-ground emesis.  Patient arrives covered in coffee-ground emesis, hypoxic on a nonrebreather.  Patient is confused and oriented to self only which seems to be her baseline.  Abdomen is soft and nontender.  Lungs are clear to auscultation.  Rectal exam showing dark brown stool guaiac negative.  # UGIB: Patient on aspirin but no other blood thinners with an upper GI  bleed.  Hemodynamically stable with normal hemoglobin.  Type and cross active.  Patient started on Protonix bolus and drip.  No history of cirrhosis of the liver.  #Acute hypoxic respiratory failure: Most likely in the setting of aspiration due to GI bleed.  Chest x-ray visualized by me consistent with bilateral infiltrates.  Radiologist thinks this could be edema but most likely aspiration pneumonitis.  Patient currently on a nonrebreather satting in the mid 90s. Patient placed on telemetry for close monitoring of cardiorespiratory status.  We will give a dose of Unasyn. Old medical records review.  Discussed with hospitalist for admission     _____________________________________________ Please note:  Patient was evaluated in Emergency Department today for the symptoms described in the history of present illness. Patient was evaluated in the context of the global COVID-19 pandemic, which necessitated consideration that the patient might be at risk for infection with the SARS-CoV-2 virus that causes COVID-19. Institutional protocols and algorithms that pertain to the evaluation of patients at risk for COVID-19 are in a state of rapid change based on information released by regulatory bodies including the CDC and federal and state organizations. These policies and algorithms were followed during the patient's care in the ED.  Some ED evaluations and interventions may be delayed as a  result of limited staffing during the pandemic.   [place] Controlled Substance Database was reviewed by me. ____________________________________________   FINAL CLINICAL IMPRESSION(S) / ED DIAGNOSES   Final diagnoses:  Upper GI bleed  Acute respiratory failure with hypoxia (HCC)  Aspiration pneumonia due to gastric secretions, unspecified laterality, unspecified part of lung (HCC)      NEW MEDICATIONS STARTED DURING THIS VISIT:  ED Discharge Orders     None        Note:  This document was prepared using Dragon voice recognition software and may include unintentional dictation errors.    Don Perking, [place], MD 11/25/20 [phone]

## 2020-11-25 NOTE — H&P (Signed)
History and Physical    [name] GXQ:[id number] DOB: 1950-09-11 DOA: 11/25/2020  PCP: Patient, No Pcp Per (Inactive)   Patient coming from: SNF  I have personally briefly reviewed patient's old medical records in [facility]  Chief Complaint: Coffee-ground emesis  HPI: Julie Morrow is a 70 y.o. female with medical history significant for Diabetes, bipolar mood disorder, chronic encephalopathy/dementia class III obesity, hypothyroidism, brought in by EMS from the nursing home following an episode of coffee-ground emesis.  She was found covered in coffee-ground emesis and altered.  She was hypoxic to the 50s with EMS arrived on nonrebreather.  Was unable to contribute to the history.  ED course: On arrival, BP 185/89 with pulse 81, respirations 24, temp 97.9, O2 sat 99% on nonrebreather.  Blood work with WBC 11.4 and hemoglobin 15.8, lactic acid 1.6, INR 1.0.  BMP pending.  EKG, personally viewed and interpreted: Sinus rhythm at 81 with nonspecific ST-T wave changes  Imaging: Chest x-ray: Cardiomegaly with vascular congestion.  No focal consolidation  Patient was given a Protonix bolus and started on a Protonix infusion, IV fluid bolus and given a dose of Unasyn.  She was transitioned from nonrebreather to O2 via nasal cannula at 4 L.  Hospitalist consulted for admission.  Review of Systems: Unreliable, due to altered mental status chronic  Past Medical History:  Diagnosis Date   Anxiety    Bipolar 1 disorder (HCC)    Bradycardia    Depression    Diabetes mellitus, type II (HCC)    Patient takes Glucotrol and Januvia   Hypothyroidism    Lithium toxicity     Past Surgical History:  Procedure Laterality Date   ABDOMINAL HYSTERECTOMY     CHOLECYSTECTOMY       reports that she has been smoking cigarettes. She started smoking about 36 years ago. She has a 10.00 pack-year smoking history. She has never used smokeless tobacco. She reports that she does not drink alcohol  and does not use drugs.  Allergies  Allergen Reactions   Neomycin     Family History  Problem Relation Age of Onset   Dementia Mother    Arthritis Father       Prior to Admission medications   Medication Sig Start Date End Date Taking? Authorizing Provider  aspirin 81 MG chewable tablet Chew 1 tablet (81 mg total) by mouth daily. 01/05/19  Yes Clapacs, Jackquline Denmark, MD  benztropine (COGENTIN) 0.5 MG tablet Take 1 tablet (0.5 mg total) by mouth 2 (two) times daily. 11/06/20  Yes Leatha Gilding, MD  buPROPion (WELLBUTRIN XL) 150 MG 24 hr tablet Take 2 tablets (300 mg total) by mouth daily. 11/06/20  Yes Gherghe, Daylene Katayama, MD  CINNAMON PO Take 1 tablet by mouth daily.   Yes [provider]  divalproex (DEPAKOTE ER) 250 MG 24 hr tablet Take 750 mg by mouth at bedtime. 10/01/19  Yes [provider]  fenofibrate 160 MG tablet Take 1 tablet (160 mg total) by mouth daily with breakfast. 01/05/19  Yes Clapacs, Jackquline Denmark, MD  FEROSUL 325 (65 Fe) MG tablet Take 325 mg by mouth daily. 10/01/19  Yes [provider]  levothyroxine (SYNTHROID) 150 MCG tablet Take 1 tablet (150 mcg total) by mouth daily. 11/07/20  Yes Leatha Gilding, MD  losartan (COZAAR) 25 MG tablet Take 25 mg by mouth daily. 08/26/20  Yes [provider]  melatonin 5 MG TABS Take 5-10 mg by mouth at bedtime.  Yes [provider]  Multiple Vitamins-Minerals (CENTRUM) tablet Take 1 tablet by mouth daily.   Yes [provider]  Probiotic Product (PROBIOTIC PO) Take 1 capsule by mouth daily.   Yes [provider]  risperiDONE (RISPERDAL) 2 MG tablet Take 1 tablet (2 mg total) by mouth at bedtime. 11/06/20  Yes Gherghe, Daylene Katayama, MD  rosuvastatin (CRESTOR) 40 MG tablet Take 40 mg by mouth daily. 12/31/18  Yes [provider]  RYBELSUS 14 MG TABS Take 1 tablet by mouth every morning. 08/26/20  Yes [provider]  polyvinyl alcohol (LIQUIFILM TEARS) 1.4 % ophthalmic  solution Place 1 drop into both eyes as needed for dry eyes.    [provider]    Physical Exam: Vitals:   11/25/20 0215 11/25/20 0225 11/25/20 0245 11/25/20 0327  BP: 108/89  121/87 126/65  Pulse: 77  81 88  Resp: (!) 22  18 (!) 23  Temp:  97.9 F (36.6 C)    TempSrc:  Axillary    SpO2: 96%  98% 97%  Weight:      Height:         Vitals:   11/25/20 0215 11/25/20 0225 11/25/20 0245 11/25/20 0327  BP: 108/89  121/87 126/65  Pulse: 77  81 88  Resp: (!) 22  18 (!) 23  Temp:  97.9 F (36.6 C)    TempSrc:  Axillary    SpO2: 96%  98% 97%  Weight:      Height:          Constitutional: Alert and oriented x 2 .  Mild conversational dyspnea  HEENT:      Head: Normocephalic and atraumatic.         Eyes: PERLA, EOMI, Conjunctivae are normal. Sclera is non-icteric.       Mouth/Throat: Dried coffee-ground emesis around mouth      Neck: Supple with no signs of meningismus. Cardiovascular: Regular rate and rhythm. No murmurs, gallops, or rubs. 2+ symmetrical distal pulses are present . No JVD. No LE edema Respiratory: Respiratory effort normal .Lungs sounds clear bilaterally. No wheezes, crackles, or rhonchi.  Gastrointestinal: Soft, non tender, and non distended with positive bowel sounds.  Genitourinary: No CVA tenderness. Musculoskeletal: Nontender with normal range of motion in all extremities. No cyanosis, or erythema of extremities. Neurologic:  Face is symmetric. Moving all extremities. No gross focal neurologic deficits . Skin: Skin is warm, dry.  No rash or ulcers Psychiatric: Mood and affect are normal    Labs on Admission: I have personally reviewed following labs and imaging studies  CBC: Recent Labs  Lab 11/25/20 0150  WBC 11.4*  NEUTROABS 9.3*  HGB 15.8*  HCT 49.5*  MCV 97.4  PLT 234   Basic Metabolic Panel: No results for input(s): NA, K, CL, CO2, GLUCOSE, BUN, CREATININE, CALCIUM, MG, PHOS in the last 168 hours. GFR: Estimated Creatinine  Clearance: 74.1 mL/min (by C-G formula based on SCr of 0.8 mg/dL). Liver Function Tests: No results for input(s): AST, ALT, ALKPHOS, BILITOT, PROT, ALBUMIN in the last 168 hours. No results for input(s): LIPASE, AMYLASE in the last 168 hours. No results for input(s): AMMONIA in the last 168 hours. Coagulation Profile: Recent Labs  Lab 11/25/20 0150  INR 1.0   Cardiac Enzymes: No results for input(s): CKTOTAL, CKMB, CKMBINDEX, TROPONINI in the last 168 hours. BNP (last 3 results) No results for input(s): PROBNP in the last 8760 hours. HbA1C: No results for input(s): HGBA1C in the last 72 hours.  CBG: No results for input(s): GLUCAP in the last 168 hours. Lipid Profile: No results for input(s): CHOL, HDL, LDLCALC, TRIG, CHOLHDL, LDLDIRECT in the last 72 hours. Thyroid Function Tests: No results for input(s): TSH, T4TOTAL, FREET4, T3FREE, THYROIDAB in the last 72 hours. Anemia Panel: No results for input(s): VITAMINB12, FOLATE, FERRITIN, TIBC, IRON, RETICCTPCT in the last 72 hours. Urine analysis:    Component Value Date/Time   COLORURINE YELLOW 11/05/2020 1930   APPEARANCEUR CLEAR 11/05/2020 1930   APPEARANCEUR Cloudy 10/30/2012 1857   LABSPEC 1.015 11/05/2020 1930   LABSPEC 1.019 10/30/2012 1857   PHURINE 5.0 11/05/2020 1930   GLUCOSEU NEGATIVE 11/05/2020 1930   GLUCOSEU Negative 10/30/2012 1857   HGBUR NEGATIVE 11/05/2020 1930   BILIRUBINUR NEGATIVE 11/05/2020 1930   BILIRUBINUR Negative 10/30/2012 1857   KETONESUR NEGATIVE 11/05/2020 1930   PROTEINUR NEGATIVE 11/05/2020 1930   NITRITE NEGATIVE 11/05/2020 1930   LEUKOCYTESUR NEGATIVE 11/05/2020 1930   LEUKOCYTESUR Trace 10/30/2012 1857    Radiological Exams on Admission: DG Chest Portable 1 View  Result Date: 11/25/2020 CLINICAL DATA:  70 year old female with hypoxia. EXAM: PORTABLE CHEST 1 VIEW COMPARISON:  Chest radiograph dated 10/07/2020. FINDINGS: There is cardiomegaly with vascular congestion. No focal  consolidation, pleural effusion, pneumothorax. No acute osseous pathology. IMPRESSION: Cardiomegaly with vascular congestion. No focal consolidation. Electronically Signed   By: Elgie Collard M.D.   On: 11/25/2020 02:28     Assessment/Plan 70 year old female with history of diabetes, bipolar mood disorder, chronic encephalopathy/dementia class III obesity, hypothyroidism, brought in by EMS from the nursing home following an episode of coffee-ground emesis.  hypoxic to the 50s with EMS arrived on nonrebreather.      Coffee ground emesis/upper GI bleed -Hold aspirin aspirin, not blood thinners - Keep n.p.o. - Continue IV Protonix - GI consult for possible EGD in the a.m.    Diabetes (HCC) - Sliding scale insulin coverage    Acute respiratory failure with hypoxia (HCC)   Aspiration pneumonitis - Patient reportedly with O2 sat in the 50s with EMS, currently on O2 at 4 L with vascular congestion on chest x-ray without consolidation - We will continue Unasyn - Supplemental O2    Morbid (severe) obesity due to excess calories (HCC) - Complicating factor to overall prognosis and care    Bipolar disorder, mixed (HCC) - Holding home meds while n.p.o.    DVT prophylaxis: SCDs code Status: full code  Family Communication:  none  Disposition Plan: Back to previous home environment Consults called: GI Status:At the time of admission, it appears that the appropriate admission status for this patient is INPATIENT. This is judged to be reasonable and necessary in order to provide the required intensity of service to ensure the patient's safety given the presenting symptoms, physical exam findings, and initial radiographic and laboratory data in the context of their  Comorbid conditions.   Patient requires inpatient status due to high intensity of service, high risk for further deterioration and high frequency of surveillance required.   I certify that at the point of admission it is my clinical  judgment that the patient will require inpatient hospital care spanning beyond 2 midnights     Andris Baumann MD [facility]     11/25/2020, 3:56 AM

## 2020-11-25 NOTE — Progress Notes (Signed)
Pharmacy Antibiotic Note  Julie Morrow is a 70 y.o. female admitted on 11/25/2020 with aspiration pneumonia.  Pharmacy has been consulted for Unasyn dosing.  Plan: Ordered Unasyn 3 gm q6h per indication and renal function.  Pharmacy will continue to follow and will adjust abx dosing if warranted.   Height: 5\' 1"  (154.9 cm) Weight: 105 kg (231 lb 7.7 oz) IBW/kg (Calculated) : 47.8  Temp (24hrs), Avg:97.9 F (36.6 C), Min:97.9 F (36.6 C), Max:97.9 F (36.6 C)  Recent Labs  Lab 11/25/20 0150  WBC 11.4*  LATICACIDVEN 1.6    Estimated Creatinine Clearance: 74.1 mL/min (by C-G formula based on SCr of 0.8 mg/dL).    Allergies  Allergen Reactions   Neomycin     Antimicrobials this admission: 7/12 Unasyn >>   Microbiology results: No labs cultures currently ordered or pending at this time.   Thank you for allowing pharmacy to be a part of this patient's care.  9/12, PharmD, [facility] 11/25/2020 4:30 AM

## 2020-11-25 NOTE — ED Triage Notes (Signed)
Pt presents from [facility] care after throwing up coffee ground emesis, hypotensive, and had low O2 sats on RA. Unknown if the patient is on blood thinners per EMS.

## 2020-11-25 NOTE — Progress Notes (Signed)
PROGRESS NOTE    Julie Morrow  NTI:[id number] DOB: 1951/05/13 DOA: 11/25/2020 PCP: Patient, No Pcp Per (Inactive)  235A/235A-AA   Assessment & Plan:   Principal Problem:   Coffee ground emesis Active Problems:   Morbid (severe) obesity due to excess calories (HCC)   Bipolar disorder, mixed (HCC)   Diabetes (HCC)   Acute respiratory failure with hypoxia (HCC)   Aspiration pneumonia (HCC)   Julie Morrow is a 70 y.o. female with medical history significant for Diabetes, bipolar mood disorder, chronic encephalopathy/dementia class III obesity, hypothyroidism, brought in by EMS from the nursing home following an episode of coffee-ground emesis.    She was found covered in coffee-ground emesis and altered.  She was hypoxic to the 50s with EMS arrived on nonrebreather.  Was unable to contribute to the history.    Coffee ground emesis upper GI bleed 2/2 Mallory-Weiss tear --Hgb has been stable and wnl --started on protonix gtt Plan: --EGD today, showed irritation of the GE junction consistent with a Mallory-Weiss tear that is healing. --cont protonix gtt      Acute respiratory failure with hypoxia (HCC) - Patient reportedly with O2 sat in the 50s with EMS, currently on O2 at 4 L  --CXR showed Cardiomegaly with vascular congestion. --Ddx include aspiration pneumonitis, CHF, COPD (positive smoking hx per medical record). Plan: --TTE --Trial IV lasix 40 mg x1  --DuoNeb TID and RT eval --cont IV Unasyn --Continue supplemental O2 to keep sats >=92%, wean as tolerated     Aspiration pneumonitis Hx of dysphagia --Procal elevated at 3.08 --cont Unasyn --dys 1 diet (recommended during last hospitalization).    Diabetes (HCC) - last A1c 6.5.  Not on hypoglycemics at home. --No need for SSI    Morbid (severe) obesity, BMI 40 due to excess calories (HCC) - Complicating factor to overall prognosis and care     Bipolar disorder, mixed (HCC) Anxiety and depression --resume  home bupropion, resperidone, benztropine, Depakote   Hypothyroid --resume home Synthroid   DVT prophylaxis: SCD/Compression stockings Code Status: Full code  Family Communication:  Level of care: Progressive Cardiac Dispo:   The patient is from: SNF Anticipated d/c is to: SNF Anticipated d/c date is: 2-3 days Patient currently is not medically ready to d/c due to: pending workup on hypoxia   Subjective and Interval History:  Hx can not be reliably obtained due to pt being confused and not speaking clearly.  EGD today showed irritation of the GE junction consistent with a Mallory-Weiss tear that is healing.    Objective: Vitals:   11/25/20 1143 11/25/20 1224 11/25/20 1535 11/25/20 1627  BP: 101/68 105/74 108/69   Pulse:  79 77   Resp: (!) 33 16 15   Temp:  98.3 F (36.8 C) 98.4 F (36.9 C)   TempSrc:  Axillary Axillary   SpO2: 96% 95% 96% 92%  Weight:      Height:        Intake/Output Summary (Last 24 hours) at 11/25/2020 1853 Last data filed at 11/25/2020 1743 Gross per 24 hour  Intake 1403.39 ml  Output 950 ml  Net 453.39 ml   Filed Weights   11/25/20 0151 11/25/20 0457  Weight: 105 kg 103.5 kg    Examination:   Constitutional: NAD, alert, dysarthria  HEENT: conjunctivae and lids normal, EOMI, mucosa dry CV: No cyanosis.   RESP: wheezing, 88-92% on RA Extremities: No effusions, edema in BLE SKIN: warm, dry Neuro: II - XII grossly intact.  Data Reviewed: I have personally reviewed following labs and imaging studies  CBC: Recent Labs  Lab 11/25/20 0150 11/25/20 0804 11/25/20 1316  WBC 11.4*  --   --   NEUTROABS 9.3*  --   --   HGB 15.8* 15.6* 13.0  HCT 49.5* 46.9* 41.5  MCV 97.4  --   --   PLT 234  --   --    Basic Metabolic Panel: Recent Labs  Lab 11/25/20 0804  NA 141  K 4.3  CL 105  CO2 27  GLUCOSE 182*  BUN 17  CREATININE 0.99  CALCIUM 9.2  MG 2.0   GFR: Estimated Creatinine Clearance: 61.6 mL/min (by C-G formula based on  SCr of 0.99 mg/dL). Liver Function Tests: No results for input(s): AST, ALT, ALKPHOS, BILITOT, PROT, ALBUMIN in the last 168 hours. No results for input(s): LIPASE, AMYLASE in the last 168 hours. No results for input(s): AMMONIA in the last 168 hours. Coagulation Profile: Recent Labs  Lab 11/25/20 0150  INR 1.0   Cardiac Enzymes: No results for input(s): CKTOTAL, CKMB, CKMBINDEX, TROPONINI in the last 168 hours. BNP (last 3 results) No results for input(s): PROBNP in the last 8760 hours. HbA1C: No results for input(s): HGBA1C in the last 72 hours. CBG: No results for input(s): GLUCAP in the last 168 hours. Lipid Profile: No results for input(s): CHOL, HDL, LDLCALC, TRIG, CHOLHDL, LDLDIRECT in the last 72 hours. Thyroid Function Tests: No results for input(s): TSH, T4TOTAL, FREET4, T3FREE, THYROIDAB in the last 72 hours. Anemia Panel: No results for input(s): VITAMINB12, FOLATE, FERRITIN, TIBC, IRON, RETICCTPCT in the last 72 hours. Sepsis Labs: Recent Labs  Lab 11/25/20 0150 11/25/20 0804  PROCALCITON  --  3.08  LATICACIDVEN 1.6  --     Recent Results (from the past 240 hour(s))  Resp Panel by RT-PCR (Flu A&B, Covid) Nasopharyngeal Swab     Status: None   Collection Time: 11/25/20  2:47 AM   Specimen: Nasopharyngeal Swab; Nasopharyngeal(NP) swabs in vial transport medium  Result Value Ref Range Status   SARS Coronavirus 2 by RT PCR NEGATIVE NEGATIVE Final    Comment: (NOTE) SARS-CoV-2 target nucleic acids are NOT DETECTED.  The SARS-CoV-2 RNA is generally detectable in upper respiratory specimens during the acute phase of infection. The lowest concentration of SARS-CoV-2 viral copies this assay can detect is 138 copies/mL. A negative result does not preclude SARS-Cov-2 infection and should not be used as the sole basis for treatment or other patient management decisions. A negative result may occur with  improper specimen collection/handling, submission of specimen  other than nasopharyngeal swab, presence of viral mutation(s) within the areas targeted by this assay, and inadequate number of viral copies(<138 copies/mL). A negative result must be combined with clinical observations, patient history, and epidemiological information. The expected result is Negative.  Fact Sheet for Patients:  BloggerCourse.com  Fact Sheet for Healthcare Providers:  SeriousBroker.it  This test is no t yet approved or cleared by the [place] FDA and  has been authorized for detection and/or diagnosis of SARS-CoV-2 by FDA under an Emergency Use Authorization (EUA). This EUA will remain  in effect (meaning this test can be used) for the duration of the COVID-19 declaration under Section 564(b)(1) of the Act, 21 U.S.C.section 360bbb-3(b)(1), unless the authorization is terminated  or revoked sooner.       Influenza A by PCR NEGATIVE NEGATIVE Final   Influenza B by PCR NEGATIVE NEGATIVE Final    Comment: (NOTE) The  Xpert Xpress SARS-CoV-2/FLU/RSV plus assay is intended as an aid in the diagnosis of influenza from Nasopharyngeal swab specimens and should not be used as a sole basis for treatment. Nasal washings and aspirates are unacceptable for Xpert Xpress SARS-CoV-2/FLU/RSV testing.  Fact Sheet for Patients: BloggerCourse.com  Fact Sheet for Healthcare Providers: SeriousBroker.it  This test is not yet approved or cleared by the [place] FDA and has been authorized for detection and/or diagnosis of SARS-CoV-2 by FDA under an Emergency Use Authorization (EUA). This EUA will remain in effect (meaning this test can be used) for the duration of the COVID-19 declaration under Section 564(b)(1) of the Act, 21 U.S.C. section 360bbb-3(b)(1), unless the authorization is terminated or revoked.  Performed at [facility], [address]  [place], [place] [address]   MRSA Next Gen by PCR, Nasal     Status: None   Collection Time: 11/25/20  7:26 AM   Specimen: Nasal Mucosa; Nasal Swab  Result Value Ref Range Status   MRSA by PCR Next Gen NOT DETECTED NOT DETECTED Final    Comment: (NOTE) The GeneXpert MRSA Assay (FDA approved for NASAL specimens only), is one component of a comprehensive MRSA colonization surveillance program. It is not intended to diagnose MRSA infection nor to guide or monitor treatment for MRSA infections. Test performance is not FDA approved in patients less than 67 years old. Performed at [facility], [address], [place], [place] [address]       Radiology Studies: DG Chest Portable 1 View  Result Date: 11/25/2020 CLINICAL DATA:  70 year old female with hypoxia. EXAM: PORTABLE CHEST 1 VIEW COMPARISON:  Chest radiograph dated 10/07/2020. FINDINGS: There is cardiomegaly with vascular congestion. No focal consolidation, pleural effusion, pneumothorax. No acute osseous pathology. IMPRESSION: Cardiomegaly with vascular congestion. No focal consolidation. Electronically Signed   By: Elgie Collard M.D.   On: 11/25/2020 02:28     Scheduled Meds:  liver oil-zinc oxide   Topical BID   [START ON 11/28/2020] pantoprazole  40 mg Intravenous Q12H   Continuous Infusions:  pantoprazole 8 mg/hr (11/25/20 1800)     LOS: 0 days     Darlin Priestly, MD [facility] If 7PM-7AM, please contact night-coverage 11/25/2020, 6:53 PM   No charge note.

## 2020-11-25 NOTE — Plan of Care (Signed)

## 2020-11-25 NOTE — ED Notes (Signed)
Pt cleaned up. Pt declined a gown at this time.

## 2020-11-25 NOTE — Progress Notes (Signed)
Patient arrived to room 235 from ED. Patient is alert to self only. She is currently on 4L nasal cannula. Wheezes and crackles present upon auscultation. Oxygen saturation 94%. Will continue to monitor.

## 2020-11-25 NOTE — ED Notes (Signed)
Non-violent mitts placed on the patients hands to prevent patient from pulling off medical equipment.

## 2020-11-25 NOTE — Progress Notes (Signed)
The patient was admitted with coffee-ground emesis and a GI consult was called.  The patient hemoglobin was stable and had increased overnight and within normal range.  The patient had an upper endoscopy that showed irritation of the GE junction consistent with a Mallory-Weiss tear that is healing.  There is no further sign of any GI bleeding.  Nothing further to do from a GI point of view.

## 2020-11-25 NOTE — Anesthesia Preprocedure Evaluation (Addendum)
Anesthesia Evaluation  Patient identified by MRN, date of birth, ID band Patient confused    Reviewed: Allergy & Precautions, NPO status , Patient's Chart, lab work & pertinent test results  History of Anesthesia Complications Negative for: history of anesthetic complications  Airway Mallampati: III  TM Distance: <3 FB Neck ROM: full    Dental  (+) Chipped, Poor Dentition, Missing   Pulmonary neg shortness of breath, pneumonia, unresolved, COPD,  COPD inhaler and oxygen dependent, Current Smoker,    + rhonchi  + decreased breath sounds      Cardiovascular Exercise Tolerance: Good + Peripheral Vascular Disease  (-) Past MI Normal cardiovascular exam     Neuro/Psych PSYCHIATRIC DISORDERS Dementia negative neurological ROS     GI/Hepatic Neg liver ROS, GERD  Medicated and Controlled,  Endo/Other  diabetes, Type 2Hypothyroidism   Renal/GU Renal disease  negative genitourinary   Musculoskeletal   Abdominal   Peds  Hematology negative hematology ROS (+)   Anesthesia Other Findings Patient is NPO appropriate and reports no nausea or vomiting today.  Past Medical History: No date: Anxiety No date: Bipolar 1 disorder (HCC) No date: Bradycardia No date: Depression No date: Diabetes mellitus, type II (HCC)     Comment:  Patient takes Glucotrol and Januvia No date: Hypothyroidism No date: Lithium toxicity  Past Surgical History: No date: ABDOMINAL HYSTERECTOMY No date: CHOLECYSTECTOMY  BMI    Body Mass Index: 40.42 kg/m      Reproductive/Obstetrics negative OB ROS                          Anesthesia Physical Anesthesia Plan  ASA: 4  Anesthesia Plan: General   Post-op Pain Management:    Induction: Intravenous  PONV Risk Score and Plan: Propofol infusion and TIVA  Airway Management Planned: Natural Airway and Nasal Cannula  Additional Equipment:   Intra-op Plan:    Post-operative Plan:   Informed Consent: I have reviewed the patients History and Physical, chart, labs and discussed the procedure including the risks, benefits and alternatives for the proposed anesthesia with the patient or authorized representative who has indicated his/her understanding and acceptance.     Dental Advisory Given  Plan Discussed with: Anesthesiologist, CRNA and Surgeon  Anesthesia Plan Comments: (History and consent from the patients sister Dedra Skeens at [phone]   Patient and sister consented for risks of anesthesia including but not limited to:  - adverse reactions to medications - risk of airway placement if required - damage to eyes, teeth, lips or other oral mucosa - nerve damage due to positioning  - sore throat or hoarseness - Damage to heart, brain, nerves, lungs, other parts of body or loss of life  They voiced understanding.)      Anesthesia Quick Evaluation

## 2020-11-26 ENCOUNTER — Inpatient Hospital Stay (HOSPITAL_COMMUNITY)
Admit: 2020-11-26 | Discharge: 2020-11-26 | Disposition: A | Discharge status: Home / Self Care | Payer: Medicare Other | Attending: Hospitalist | Admitting: Hospitalist

## 2020-11-26 ENCOUNTER — Encounter: Payer: Self-pay | Admitting: Gastroenterology

## 2020-11-26 DIAGNOSIS — R0609 Other forms of dyspnea: Secondary | ICD-10-CM | POA: Diagnosis not present

## 2020-11-26 DIAGNOSIS — J690 Pneumonitis due to inhalation of food and vomit: Secondary | ICD-10-CM

## 2020-11-26 DIAGNOSIS — J9601 Acute respiratory failure with hypoxia: Secondary | ICD-10-CM

## 2020-11-26 LAB — BASIC METABOLIC PANEL
Anion gap: 9 (ref 5–15)
BUN: 23 mg/dL (ref 8–23)
CO2: 30 mmol/L (ref 22–32)
Calcium: 8.9 mg/dL (ref 8.9–10.3)
Chloride: 105 mmol/L (ref 98–111)
Creatinine, Ser: 1.22 mg/dL — ABNORMAL HIGH (ref 0.44–1.00)
GFR, Estimated: 48 mL/min — ABNORMAL LOW (ref >60)
Glucose, Bld: 128 mg/dL — ABNORMAL HIGH (ref 70–99)
Potassium: 3.5 mmol/L (ref 3.5–5.1)
Sodium: 144 mmol/L (ref 135–145)

## 2020-11-26 LAB — CBC
HCT: 38 % (ref 36.0–46.0)
Hemoglobin: 12.5 g/dL (ref 12.0–15.0)
MCH: 32.1 pg (ref 26.0–34.0)
MCHC: 32.9 g/dL (ref 30.0–36.0)
MCV: 97.4 fL (ref 80.0–100.0)
Platelets: 165 10*3/uL (ref 150–400)
RBC: 3.9 MIL/uL (ref 3.87–5.11)
RDW: 15.4 % (ref 11.5–15.5)
WBC: 12.4 10*3/uL — ABNORMAL HIGH (ref 4.0–10.5)
nRBC: 0 % (ref 0.0–0.2)

## 2020-11-26 LAB — ECHOCARDIOGRAM COMPLETE
Height: 63 in
S' Lateral: 3.13 cm
Weight: 3651.2 oz

## 2020-11-26 LAB — PROCALCITONIN: Procalcitonin: 6.33 ng/mL

## 2020-11-26 LAB — MAGNESIUM: Magnesium: 2 mg/dL (ref 1.7–2.4)

## 2020-11-26 MED ORDER — ENSURE ENLIVE PO LIQD: 237.0000 mL | Freq: Two times a day (BID) | ORAL | Status: DC

## 2020-11-26 MED ORDER — PANTOPRAZOLE SODIUM 40 MG IV SOLR
40.0000 mg | Freq: Two times a day (BID) | INTRAVENOUS | Status: DC
  Administered 2020-11-28 – 2020-11-29 (×3): 40 mg via INTRAVENOUS
  Filled 2020-11-26 (×3): qty 40

## 2020-11-26 MED ORDER — SODIUM CHLORIDE 0.9 % IV SOLN: INTRAVENOUS | Status: DC

## 2020-11-26 MED ORDER — ADULT MULTIVITAMIN W/MINERALS CH
1.0000 | ORAL_TABLET | Freq: Every day | ORAL | Status: DC
  Administered 2020-11-27 – 2020-12-02 (×6): 1 via ORAL
  Filled 2020-11-26 (×6): qty 1

## 2020-11-26 NOTE — Progress Notes (Addendum)
Initial Nutrition Assessment  DOCUMENTATION CODES:   Morbid obesity  INTERVENTION:   Ensure Enlive po BID, each supplement provides 350 kcal and 20 grams of protein  MVI po daily   NUTRITION DIAGNOSIS:   Inadequate oral intake related to acute illness as evidenced by per patient/family report.  GOAL:   Patient will meet greater than or equal to 90% of their needs  MONITOR:   PO intake, Supplement acceptance, Labs, Weight trends, Skin, I & O's  REASON FOR ASSESSMENT:   Malnutrition Screening Tool    ASSESSMENT:   70 y.o. female with medical history significant for Diabetes, bipolar mood disorder, chronic encephalopathy/dementia class III obesity, hypothyroidism, brought in by EMS from the nursing home following an episode of coffee-ground emesis and aspiration PNA  Met with pt in room today. Pt sleeping at time of RD visit; RD unable to wake pt. Per chart review, pt admitted from [facility]. Pt admitted with coffee ground emesis. Pt s/p EGD 7/12 and was found to have Mallory-Weiss tear. Pt ate 25% of her breakfast this morning and 100% of her lunch. RD will add supplements and MVI to help pt meet her estimated needs. Per chart, pt down 71lbs(24%) over the past year; this is significant weight loss. RD unsure if weight loss was intentional or not.   Medications reviewed and include: aspirin, synthroid, melatonin, protonix, NaCl $RemoveBefore'@50ml'bQYtdgYspkUYk$ /hr, unasyn   Labs reviewed: creat 1.22(H) Wbc- 12.4(H)  NUTRITION - FOCUSED PHYSICAL EXAM:  Flowsheet Row Most Recent Value  Orbital Region No depletion  Upper Arm Region No depletion  Thoracic and Lumbar Region No depletion  Buccal Region No depletion  Temple Region No depletion  Clavicle Bone Region Mild depletion  Clavicle and Acromion Bone Region Mild depletion  Scapular Bone Region No depletion  Dorsal Hand No depletion  Patellar Region No depletion  Anterior Thigh Region No depletion  Posterior Calf Region No depletion   Edema (RD Assessment) None  Hair Reviewed  Eyes Reviewed  Mouth Reviewed  Skin Reviewed  Nails Reviewed   Diet Order:   Diet Order             DIET - DYS 1 Room service appropriate? Yes; Fluid consistency: Thin  Diet effective now                  EDUCATION NEEDS:   No education needs have been identified at this time  Skin:  Skin Assessment: Reviewed RN Assessment (patchy areas of red most full thickness skin loss across sacrum; affected area is approx 4X4X.2cm)  Last BM:  7/12- type 6  Height:   Ht Readings from Last 1 Encounters:  11/25/20 $RemoveB'5\' 3"'SrJfCLxv$  (1.6 m)    Weight:   Wt Readings from Last 1 Encounters:  11/25/20 103.5 kg    Ideal Body Weight:  52.2 kg  BMI:  Body mass index is 40.42 kg/m.  Estimated Nutritional Needs:   Kcal:  1900-2200kcal/day  Protein:  95-110g/day  Fluid:  1.5L/day  Koleen Distance MS, RD, LDN Please refer to [facility] for RD and/or RD on-call/weekend/after hours pager

## 2020-11-26 NOTE — Progress Notes (Signed)
PROGRESS NOTE    Julie Morrow  ZOX:[name]409RN:[id number] DOB: [birth year] DOA: 11/25/2020 PCP: Patient, No Pcp Per (Inactive)   Chief complaint.  Coffee-ground emesis. Brief Narrative:  Julie DamesSusan B Morrow is a 70 y.o. female with medical history significant for Diabetes, bipolar mood disorder, chronic encephalopathy/dementia class III obesity, hypothyroidism, brought in by EMS from the nursing home following an episode of coffee-ground emesis.  She was found covered in coffee-ground emesis and altered.  She was hypoxic to the 50s with EMS arrived on nonrebreather. EGD was performed on 7/12, showed a Mallory-Weiss tear.  She is placed Protonix gtt.   Assessment & Plan:   Principal Problem:   Coffee ground emesis Active Problems:   Morbid (severe) obesity due to excess calories (HCC)   Bipolar disorder, mixed (HCC)   Diabetes (HCC)   Acute respiratory failure with hypoxia (HCC)   Aspiration pneumonia (HCC)  #1.  Acute upper GI bleed secondary to Mallory-Weiss tear. Mallory-Weiss tear. Had a EGD performed, confirming diagnosis.  No additional emesis.  Continue Protonix gtt.  2.  Acute hypoxemic respiratory failure. Aspiration pneumonia. History of dysphagia Patient developed acute respiratory failure after aspiration event.  He was initially on BiPAP, currently on 3 L oxygen.  He has a profound elevation of procalcitonin level. Continue Unasyn for aspiration pneumonia. Obtain speech therapy evaluation.  3.  Dementia. Bipolar disorder. Anxiety depression. Continue home medicines.  4.  Type 2 diabetes. Glucose not elevated.  Continue to follow.  5.  Mild acute kidney injury. Patient has normal BNP, will give gentle rehydration.  DVT prophylaxis: SCDs Code Status: Full Family Communication:  Disposition Plan:    Status is: Inpatient  Remains inpatient appropriate because:IV treatments appropriate due to intensity of illness or inability to take PO and Inpatient level of care  appropriate due to severity of illness  Dispo: The patient is from: SNF              Anticipated d/c is to: SNF              Patient currently is not medically stable to d/c.   Difficult to place patient No        I/O last 3 completed shifts: In: 2038 [P.O.:270; I.V.:280.6; IV Piggyback:1487.4] Out: 1500 [Urine:1500] Total I/O In: 505 [P.O.:380; Other:25; IV Piggyback:100] Out: -      Consultants:  GI  Procedures: EGD  Antimicrobials:  Unasyn. Subjective: Patient appears very confused, still on 3 L oxygen, short of breath with exertion.  No cough. No nausea vomiting, no diarrhea or constipation.  No abdominal pain. No fever or chills. No dysuria hematuria.  Objective: Vitals:   11/26/20 0418 11/26/20 0737 11/26/20 1220 11/26/20 1349  BP: (!) 119/51 (!) 115/52 (!) 103/46   Pulse: 79 84 73   Resp: 20 17 20    Temp: 98.6 F (37 C) 98.1 F (36.7 C) 98.2 F (36.8 C)   TempSrc:      SpO2: 93% 94% 97% 95%  Weight:      Height:        Intake/Output Summary (Last 24 hours) at 11/26/2020 1420 Last data filed at 11/26/2020 1300 Gross per 24 hour  Intake 1139.59 ml  Output 1500 ml  Net -360.41 ml   Filed Weights   11/25/20 0151 11/25/20 0457  Weight: 105 kg 103.5 kg    Examination:  General exam: Appears calm and comfortable  Respiratory system: Crackles in the base. Respiratory effort normal. Cardiovascular system: S1 & S2 heard, RRR.  No JVD, murmurs, rubs, gallops or clicks. No pedal edema. Gastrointestinal system: Abdomen is nondistended, soft and nontender. No organomegaly or masses felt. Normal bowel sounds heard. Central nervous system: Alert and oriented x1. No focal neurological deficits. Extremities: Symmetric 5 x 5 power. Skin: No rashes, lesions or ulcers     Data Reviewed: I have personally reviewed following labs and imaging studies  CBC: Recent Labs  Lab 11/25/20 0150 11/25/20 0804 11/25/20 1316 11/26/20 0621  WBC 11.4*  --   --   12.4*  NEUTROABS 9.3*  --   --   --   HGB 15.8* 15.6* 13.0 12.5  HCT 49.5* 46.9* 41.5 38.0  MCV 97.4  --   --  97.4  PLT 234  --   --  165   Basic Metabolic Panel: Recent Labs  Lab 11/25/20 0804 11/26/20 0621  NA 141 144  K 4.3 3.5  CL 105 105  CO2 27 30  GLUCOSE 182* 128*  BUN 17 23  CREATININE 0.99 1.22*  CALCIUM 9.2 8.9  MG 2.0 2.0   GFR: Estimated Creatinine Clearance: 50 mL/min (A) (by C-G formula based on SCr of 1.22 mg/dL (H)). Liver Function Tests: No results for input(s): AST, ALT, ALKPHOS, BILITOT, PROT, ALBUMIN in the last 168 hours. No results for input(s): LIPASE, AMYLASE in the last 168 hours. No results for input(s): AMMONIA in the last 168 hours. Coagulation Profile: Recent Labs  Lab 11/25/20 0150  INR 1.0   Cardiac Enzymes: No results for input(s): CKTOTAL, CKMB, CKMBINDEX, TROPONINI in the last 168 hours. BNP (last 3 results) No results for input(s): PROBNP in the last 8760 hours. HbA1C: No results for input(s): HGBA1C in the last 72 hours. CBG: No results for input(s): GLUCAP in the last 168 hours. Lipid Profile: No results for input(s): CHOL, HDL, LDLCALC, TRIG, CHOLHDL, LDLDIRECT in the last 72 hours. Thyroid Function Tests: No results for input(s): TSH, T4TOTAL, FREET4, T3FREE, THYROIDAB in the last 72 hours. Anemia Panel: No results for input(s): VITAMINB12, FOLATE, FERRITIN, TIBC, IRON, RETICCTPCT in the last 72 hours. Sepsis Labs: Recent Labs  Lab 11/25/20 0150 11/25/20 0804 11/26/20 0621  PROCALCITON  --  3.08 6.33  LATICACIDVEN 1.6  --   --     Recent Results (from the past 240 hour(s))  Resp Panel by RT-PCR (Flu A&B, Covid) Nasopharyngeal Swab     Status: None   Collection Time: 11/25/20  2:47 AM   Specimen: Nasopharyngeal Swab; Nasopharyngeal(NP) swabs in vial transport medium  Result Value Ref Range Status   SARS Coronavirus 2 by RT PCR NEGATIVE NEGATIVE Final    Comment: (NOTE) SARS-CoV-2 target nucleic acids are NOT  DETECTED.  The SARS-CoV-2 RNA is generally detectable in upper respiratory specimens during the acute phase of infection. The lowest concentration of SARS-CoV-2 viral copies this assay can detect is 138 copies/mL. A negative result does not preclude SARS-Cov-2 infection and should not be used as the sole basis for treatment or other patient management decisions. A negative result may occur with  improper specimen collection/handling, submission of specimen other than nasopharyngeal swab, presence of viral mutation(s) within the areas targeted by this assay, and inadequate number of viral copies(<138 copies/mL). A negative result must be combined with clinical observations, patient history, and epidemiological information. The expected result is Negative.  Fact Sheet for Patients:  BloggerCourse.com  Fact Sheet for Healthcare Providers:  SeriousBroker.it  This test is no t yet approved or cleared by the [place] and  has been authorized for detection and/or diagnosis of SARS-CoV-2 by FDA under an Emergency Use Authorization (EUA). This EUA will remain  in effect (meaning this test can be used) for the duration of the COVID-19 declaration under Section 564(b)(1) of the Act, 21 U.S.C.section 360bbb-3(b)(1), unless the authorization is terminated  or revoked sooner.       Influenza A by PCR NEGATIVE NEGATIVE Final   Influenza B by PCR NEGATIVE NEGATIVE Final    Comment: (NOTE) The Xpert Xpress SARS-CoV-2/FLU/RSV plus assay is intended as an aid in the diagnosis of influenza from Nasopharyngeal swab specimens and should not be used as a sole basis for treatment. Nasal washings and aspirates are unacceptable for Xpert Xpress SARS-CoV-2/FLU/RSV testing.  Fact Sheet for Patients: BloggerCourse.com  Fact Sheet for Healthcare Providers: SeriousBroker.it  This test is not yet  approved or cleared by the [place] FDA and has been authorized for detection and/or diagnosis of SARS-CoV-2 by FDA under an Emergency Use Authorization (EUA). This EUA will remain in effect (meaning this test can be used) for the duration of the COVID-19 declaration under Section 564(b)(1) of the Act, 21 U.S.C. section 360bbb-3(b)(1), unless the authorization is terminated or revoked.  Performed at [facility], [address] [place], [place] [address]   MRSA Next Gen by PCR, Nasal     Status: None   Collection Time: 11/25/20  7:26 AM   Specimen: Nasal Mucosa; Nasal Swab  Result Value Ref Range Status   MRSA by PCR Next Gen NOT DETECTED NOT DETECTED Final    Comment: (NOTE) The GeneXpert MRSA Assay (FDA approved for NASAL specimens only), is one component of a comprehensive MRSA colonization surveillance program. It is not intended to diagnose MRSA infection nor to guide or monitor treatment for MRSA infections. Test performance is not FDA approved in patients less than 76 years old. Performed at [facility], [address] [place], [place] [address]          Radiology Studies: DG Chest Portable 1 View  Result Date: 11/25/2020 CLINICAL DATA:  70 year old female with hypoxia. EXAM: PORTABLE CHEST 1 VIEW COMPARISON:  Chest radiograph dated 10/07/2020. FINDINGS: There is cardiomegaly with vascular congestion. No focal consolidation, pleural effusion, pneumothorax. No acute osseous pathology. IMPRESSION: Cardiomegaly with vascular congestion. No focal consolidation. Electronically Signed   By: Elgie Collard M.D.   On: 11/25/2020 02:28   ECHOCARDIOGRAM COMPLETE  Result Date: 11/26/2020    ECHOCARDIOGRAM REPORT   Patient Name:   VONNIE SPAGNOLO Date of Exam: 11/26/2020 Medical Rec #:  [id number]      Height:       63.0 in Accession #:    [id number]     Weight:       228.2 lb Date of Birth:  1951-02-23     BSA:          2.045 m Patient Age:    69 years        BP:           115/52 mmHg Patient Gender: F              HR:           84 bpm. Exam Location:  [facility] Procedure: 2D Echo, Color Doppler and Cardiac Doppler Indications:     Dyspnea R06.00  History:         Patient has no prior history of Echocardiogram examinations.  COPD; Risk Factors:Diabetes.  Sonographer:     Cristela Blue RDCS (AE) Referring Phys:  [id number] Inetta Fermo LAI Diagnosing Phys: Debbe Odea MD  Sonographer Comments: No apical window and no subcostal window. Image acquisition challenging due to COPD. IMPRESSIONS  1. Left ventricular ejection fraction, by estimation, is 55 to 60%. The left ventricle has normal function. The left ventricle has no regional wall motion abnormalities. There is mild left ventricular hypertrophy. Left ventricular diastolic function could not be evaluated.  2. Right ventricular systolic function is normal. The right ventricular size is normal.  3. The mitral valve is normal in structure. No evidence of mitral valve regurgitation.  4. The aortic valve is tricuspid. Aortic valve regurgitation is not visualized. FINDINGS  Left Ventricle: Left ventricular ejection fraction, by estimation, is 55 to 60%. The left ventricle has normal function. The left ventricle has no regional wall motion abnormalities. The left ventricular internal cavity size was normal in size. There is  mild left ventricular hypertrophy. Left ventricular diastolic function could not be evaluated. Right Ventricle: The right ventricular size is normal. No increase in right ventricular wall thickness. Right ventricular systolic function is normal. Left Atrium: Left atrial size was normal in size. Right Atrium: Right atrial size was not well visualized. Pericardium: There is no evidence of pericardial effusion. Mitral Valve: The mitral valve is normal in structure. No evidence of mitral valve regurgitation. Tricuspid Valve: The tricuspid valve is grossly normal. Tricuspid valve regurgitation is not  demonstrated. Aortic Valve: The aortic valve is tricuspid. Aortic valve regurgitation is not visualized. Pulmonic Valve: The pulmonic valve was not well visualized. Pulmonic valve regurgitation is not visualized. Aorta: The aortic root is normal in size and structure. Venous: The inferior vena cava was not well visualized. IAS/Shunts: No atrial level shunt detected by color flow Doppler.  LEFT VENTRICLE PLAX 2D LVIDd:         4.38 cm LVIDs:         3.13 cm LV PW:         1.33 cm LV IVS:        1.34 cm LVOT diam:     2.30 cm LVOT Area:     4.15 cm  LEFT ATRIUM         Index LA diam:    3.60 cm 1.76 cm/m                        PULMONIC VALVE AORTA                 PV Vmax:        1.02 m/s Ao Root diam: 2.70 cm PV Peak grad:   4.2 mmHg                       RVOT Peak grad: 5 mmHg   SHUNTS Systemic Diam: 2.30 cm Debbe Odea MD Electronically signed by Debbe Odea MD Signature Date/Time: 11/26/2020/1:40:28 PM    Final         Scheduled Meds:  aspirin  81 mg Oral Daily   benztropine  0.5 mg Oral BID   buPROPion  300 mg Oral Daily   divalproex  750 mg Oral QHS   ipratropium-albuterol  3 mL Nebulization TID   levothyroxine  150 mcg Oral Q0600   liver oil-zinc oxide   Topical BID   melatonin  5 mg Oral QHS   [START ON 11/28/2020] pantoprazole  40 mg Intravenous Q12H  risperiDONE  2 mg Oral QHS   Continuous Infusions:  ampicillin-sulbactam (UNASYN) IV 3 g (11/26/20 0852)     LOS: 1 day    Time spent: 36 minutes    Marrion Coy, MD [facility]   To contact the attending provider between 7A-7P or the covering provider during after hours 7P-7A, please log into the web site [email or web address] and access using universal [place] password for that web site. If you do not have the password, please call the hospital operator.  11/26/2020, 2:20 PM

## 2020-11-26 NOTE — Plan of Care (Signed)

## 2020-11-26 NOTE — Progress Notes (Signed)
*  PRELIMINARY RESULTS* Echocardiogram 2D Echocardiogram has been performed.  Julie Morrow 11/26/2020, 8:29 AM

## 2020-11-27 LAB — CBC
HCT: 36.5 % (ref 36.0–46.0)
Hemoglobin: 11.3 g/dL — ABNORMAL LOW (ref 12.0–15.0)
MCH: 31.1 pg (ref 26.0–34.0)
MCHC: 31 g/dL (ref 30.0–36.0)
MCV: 100.6 fL — ABNORMAL HIGH (ref 80.0–100.0)
Platelets: 156 10*3/uL (ref 150–400)
RBC: 3.63 MIL/uL — ABNORMAL LOW (ref 3.87–5.11)
RDW: 15.3 % (ref 11.5–15.5)
WBC: 11.7 10*3/uL — ABNORMAL HIGH (ref 4.0–10.5)
nRBC: 0 % (ref 0.0–0.2)

## 2020-11-27 LAB — BASIC METABOLIC PANEL
Anion gap: 10 (ref 5–15)
BUN: 20 mg/dL (ref 8–23)
CO2: 27 mmol/L (ref 22–32)
Calcium: 8.7 mg/dL — ABNORMAL LOW (ref 8.9–10.3)
Chloride: 108 mmol/L (ref 98–111)
Creatinine, Ser: 0.77 mg/dL (ref 0.44–1.00)
GFR, Estimated: >60 mL/min (ref >60)
Glucose, Bld: 130 mg/dL — ABNORMAL HIGH (ref 70–99)
Potassium: 3.3 mmol/L — ABNORMAL LOW (ref 3.5–5.1)
Sodium: 145 mmol/L (ref 135–145)

## 2020-11-27 LAB — PROCALCITONIN: Procalcitonin: 2.87 ng/mL

## 2020-11-27 LAB — MAGNESIUM: Magnesium: 2.1 mg/dL (ref 1.7–2.4)

## 2020-11-27 MED ORDER — POTASSIUM CHLORIDE 10 MEQ/100ML IV SOLN
10.0000 meq | INTRAVENOUS | Status: AC
  Administered 2020-11-27 (×2): 10 meq via INTRAVENOUS
  Filled 2020-11-27 (×2): qty 100

## 2020-11-27 MED ORDER — NEPRO/CARBSTEADY PO LIQD
237.0000 mL | Freq: Two times a day (BID) | ORAL | Status: DC
  Administered 2020-11-28 – 2020-12-02 (×9): 237 mL via ORAL

## 2020-11-27 MED ORDER — SODIUM CHLORIDE 0.9% FLUSH
10.0000 mL | Freq: Two times a day (BID) | INTRAVENOUS | Status: DC
  Administered 2020-11-27 – 2020-12-02 (×9): 10 mL

## 2020-11-27 MED ORDER — SODIUM CHLORIDE 0.9% FLUSH: 10.0000 mL | INTRAVENOUS | Status: DC | PRN

## 2020-11-27 NOTE — Evaluation (Signed)
Clinical/Bedside Swallow Evaluation Patient Details  Name: ILEE Morrow MRN: [id number] Date of Birth: 1951/01/22  Today's Date: 11/27/2020 Time: SLP Start Time (ACUTE ONLY): 1215 SLP Stop Time (ACUTE ONLY): 1240 SLP Time Calculation (min) (ACUTE ONLY): 25 min  Past Medical History:  Past Medical History:  Diagnosis Date   Anxiety    Bipolar 1 disorder (HCC)    Bradycardia    Depression    Diabetes mellitus, type II (HCC)    Patient takes Glucotrol and Januvia   Hypothyroidism    Lithium toxicity    Past Surgical History:  Past Surgical History:  Procedure Laterality Date   ABDOMINAL HYSTERECTOMY     CHOLECYSTECTOMY     ESOPHAGOGASTRODUODENOSCOPY (EGD) WITH PROPOFOL N/A 11/25/2020   Procedure: ESOPHAGOGASTRODUODENOSCOPY (EGD) WITH PROPOFOL;  Surgeon: Midge Minium, MD;  Location: [facility] ENDOSCOPY;  Service: Endoscopy;  Laterality: N/A;   HPI:  Julie Morrow is a 70 y.o. female with medical history significant for Diabetes, bipolar mood disorder, chronic encephalopathy/dementia class III obesity, hypothyroidism, brought in by EMS from the nursing home following an episode of coffee-ground emesis.  She was found covered in coffee-ground emesis and altered.  She was hypoxic to the 50s with EMS arrived on nonrebreather.  EGD was performed on 7/12, showed a Mallory-Weiss tear.   Assessment / Plan / Recommendation Clinical Impression  Most recent ST notes from 10/08/2020 appear to endorse same abilities as observed at bedside during this evaluation. At baseline, prior to PO consumption, pt has strong productive cough. When consuming 20 oz of thin liquids via straw, pt's swallow appeared swift with no overt s/s of aspiration observed with each bolus. Pt does consume multiple consecutive sips but her vitals and vocal quality remained stable throughout. Her oral phase was adequate when consuming puree. Pt is very distractible and demonstrates significantly altered mentation. Therefore recommend  strict aspiration precautions when consuming dysphagia 1 with thin liquids via straw, medicine crushed in puree. Education provided to pt's nurses on current findings and diet recommendations. Both voice concern with pt consuming thin liquids, especially in the evenings. Recommended supplementing floor stock nectar thick liquids in the evening if pt appears fatigued. Concern still felt by pt's nurses, therefore out of an abundance of caution, recommend dysphagia 1 diet with nectar thick liquids, medicine crushed in puree. ST will re-assess pt tomorrow. SLP Visit Diagnosis: Dysphagia, unspecified (R13.10)    Aspiration Risk  Mild aspiration risk    Diet Recommendation Dysphagia 1 (Puree);Nectar-thick liquid   Liquid Administration via: Cup Medication Administration: Crushed with puree Supervision: Full supervision/cueing for compensatory strategies;Staff to assist with self feeding Compensations: Slow rate;Small sips/bites;Lingual sweep for clearance of pocketing Postural Changes: Seated upright at 90 degrees;Remain upright for at least 30 minutes after po intake    Other  Recommendations Oral Care Recommendations: Oral care BID   Follow up Recommendations None      Frequency and Duration min 1 x/week  1 week       Prognosis Prognosis for Safe Diet Advancement: Good Barriers to Reach Goals: Cognitive deficits      Swallow Study   General Date of Onset: 11/25/20 HPI: Julie Morrow is a 70 y.o. female with medical history significant for Diabetes, bipolar mood disorder, chronic encephalopathy/dementia class III obesity, hypothyroidism, brought in by EMS from the nursing home following an episode of coffee-ground emesis.  She was found covered in coffee-ground emesis and altered.  She was hypoxic to the 50s with EMS arrived on nonrebreather.  EGD  was performed on 7/12, showed a Mallory-Weiss tear. Type of Study: Bedside Swallow Evaluation Previous Swallow Assessment: BSE 10/08/2020 Diet  Prior to this Study: Dysphagia 1 (puree);Thin liquids Temperature Spikes Noted: No Respiratory Status: Nasal cannula History of Recent Intubation: No Behavior/Cognition: Alert;Confused;Distractible;Requires cueing;Doesn't follow directions Oral Cavity Assessment: Within Functional Limits Oral Care Completed by SLP: No Oral Cavity - Dentition: Adequate natural dentition Vision: Functional for self-feeding Self-Feeding Abilities: Needs assist;Needs set up Patient Positioning: Upright in bed Baseline Vocal Quality: Low vocal intensity Volitional Cough: Strong;Congested Volitional Swallow: Unable to elicit    Oral/Motor/Sensory Function Overall Oral Motor/Sensory Function: Generalized oral weakness   Ice Chips Ice chips: Not tested   Thin Liquid Thin Liquid: Within functional limits Presentation: Self Fed;Straw    Nectar Thick Nectar Thick Liquid: Not tested   Honey Thick Honey Thick Liquid: Not tested   Puree Puree: Within functional limits Presentation: Spoon   Solid     Solid: Not tested     Julie Morrow B. Julie Morrow M.S., CCC-SLP, [facility] Speech-Language Pathologist Rehabilitation Services Office [phone]  Julie Morrow Julie Morrow 11/27/2020,4:58 PM

## 2020-11-27 NOTE — Progress Notes (Addendum)
PROGRESS NOTE    Julie Morrow  ZOX:[id number] DOB: [birth year] DOA: 11/25/2020 PCP: Patient, No Pcp Per (Inactive)   Chief complaint.  Coffee-ground emesis. Brief Narrative:  Julie DamesSusan B Grime is a 70 y.o. female with medical history significant for Diabetes, bipolar mood disorder, chronic encephalopathy/dementia class III obesity, hypothyroidism, brought in by EMS from the nursing home following an episode of coffee-ground emesis.  She was found covered in coffee-ground emesis and altered.  She was hypoxic to the 50s with EMS arrived on nonrebreather. EGD was performed on 7/12, showed a Mallory-Weiss tear.  She is placed Protonix gtt. Patient also developed acute hypoxia due to aspiration pneumonia, she has a profound elevation of procalcitonin level.  She is treated with Unasyn.  She also found to have severe dysphagia, speech therapy evaluation obtained.   Assessment & Plan:   Principal Problem:   Coffee ground emesis Active Problems:   Morbid (severe) obesity due to excess calories (HCC)   Bipolar disorder, mixed (HCC)   Diabetes (HCC)   Acute respiratory failure with hypoxia (HCC)   Aspiration pneumonia (HCC)  #1.  Acute upper GI bleed secondary to Mallory-Weiss tear. Mallory-Weiss tear. Condition relatively stable, no additional emesis.  Continue Protonix twice a day.  2.  Acute hypoxemic respiratory failure. Aspiration pneumonia Dysphagia. Patient oxygenation is gradually improving, however, she is still on 3 L oxygen.  She is continued on Unasyn for aspiration pneumonia. However, she was a found to have a severe dysphagia with high risk of aspiration.  Speech therapist evaluation is pending.  #3.  Dementia. Bipolar disorder Anxiety depression plan Continue home medicines  4.  Type 2 diabetes. Continue sliding scale insulin.  #5.  Acute kidney injury.Ruled in.  Hypokalemia Renal function has improved, will discontinue IV fluids. Replete potassium.  #6.  Goal of  care discussion. Discussed with patient sister, patient power of attorney is her son, but the son cut off contact with her 3 years ago.  Her only family members are her sisters.  Discussed with sister about her poor prognosis, he still want her to be full code.  Palliative care will be consulted.    DVT prophylaxis: SCDs Code Status: full Family Communication: as above Disposition Plan:    Status is: Inpatient  Remains inpatient appropriate because:Unsafe d/c plan, IV treatments appropriate due to intensity of illness or inability to take PO, and Inpatient level of care appropriate due to severity of illness  Dispo: The patient is from: SNF              Anticipated d/c is to: SNF              Patient currently is not medically stable to d/c.   Difficult to place patient No        I/O last 3 completed shifts: In: 1239.6 [P.O.:750; I.V.:164.6; Other:25; IV Piggyback:300] Out: 1000 [Urine:1000] No intake/output data recorded.     Consultants:  GI  Procedures: EGD  Antimicrobials: Unasyn  Subjective: Patient was found to have a severe dysphagia since last night, she was choking on thick liquid. She also has significant confusion, hypoxia, short of breath with exertion. No abdominal pain or nausea vomiting. No fever chills No dysuria hematuria.  Objective: Vitals:   11/26/20 1533 11/26/20 1953 11/27/20 0753 11/27/20 0754  BP: (!) 119/45 (!) 107/53 93/74   Pulse: 77 70 74   Resp: 19 16 17    Temp: 98 F (36.7 C) 98.9 F (37.2 C) 98.1 F (36.7 C)  TempSrc: Oral  Oral   SpO2: 95% 90% 95% 95%  Weight:      Height:        Intake/Output Summary (Last 24 hours) at 11/27/2020 0953 Last data filed at 11/27/2020 0700 Gross per 24 hour  Intake 240 ml  Output 450 ml  Net -210 ml   Filed Weights   11/25/20 0151 11/25/20 0457  Weight: 105 kg 103.5 kg    Examination:  General exam: Appears calm and comfortable  Respiratory system: Rales in the base. Respiratory  effort normal. Cardiovascular system: S1 & S2 heard, RRR. No JVD, murmurs, rubs, gallops or clicks. No pedal edema. Gastrointestinal system: Abdomen is nondistended, soft and nontender. No organomegaly or masses felt. Normal bowel sounds heard. Central nervous system: Alert and oriented x1. No focal neurological deficits. Extremities: Symmetric 5 x 5 power. Skin: No rashes, lesions or ulcers     Data Reviewed: I have personally reviewed following labs and imaging studies  CBC: Recent Labs  Lab 11/25/20 0150 11/25/20 0804 11/25/20 1316 11/26/20 0621 11/27/20 0627  WBC 11.4*  --   --  12.4* 11.7*  NEUTROABS 9.3*  --   --   --   --   HGB 15.8* 15.6* 13.0 12.5 11.3*  HCT 49.5* 46.9* 41.5 38.0 36.5  MCV 97.4  --   --  97.4 100.6*  PLT 234  --   --  165 156   Basic Metabolic Panel: Recent Labs  Lab 11/25/20 0804 11/26/20 0621 11/27/20 0627  NA 141 144 145  K 4.3 3.5 3.3*  CL 105 105 108  CO2 27 30 27   GLUCOSE 182* 128* 130*  BUN 17 23 20   CREATININE 0.99 1.22* 0.77  CALCIUM 9.2 8.9 8.7*  MG 2.0 2.0 2.1   GFR: Estimated Creatinine Clearance: 76.3 mL/min (by C-G formula based on SCr of 0.77 mg/dL). Liver Function Tests: No results for input(s): AST, ALT, ALKPHOS, BILITOT, PROT, ALBUMIN in the last 168 hours. No results for input(s): LIPASE, AMYLASE in the last 168 hours. No results for input(s): AMMONIA in the last 168 hours. Coagulation Profile: Recent Labs  Lab 11/25/20 0150  INR 1.0   Cardiac Enzymes: No results for input(s): CKTOTAL, CKMB, CKMBINDEX, TROPONINI in the last 168 hours. BNP (last 3 results) No results for input(s): PROBNP in the last 8760 hours. HbA1C: No results for input(s): HGBA1C in the last 72 hours. CBG: No results for input(s): GLUCAP in the last 168 hours. Lipid Profile: No results for input(s): CHOL, HDL, LDLCALC, TRIG, CHOLHDL, LDLDIRECT in the last 72 hours. Thyroid Function Tests: No results for input(s): TSH, T4TOTAL, FREET4,  T3FREE, THYROIDAB in the last 72 hours. Anemia Panel: No results for input(s): VITAMINB12, FOLATE, FERRITIN, TIBC, IRON, RETICCTPCT in the last 72 hours. Sepsis Labs: Recent Labs  Lab 11/25/20 0150 11/25/20 0804 11/26/20 0621 11/27/20 0627  PROCALCITON  --  3.08 6.33 2.87  LATICACIDVEN 1.6  --   --   --     Recent Results (from the past 240 hour(s))  Resp Panel by RT-PCR (Flu A&B, Covid) Nasopharyngeal Swab     Status: None   Collection Time: 11/25/20  2:47 AM   Specimen: Nasopharyngeal Swab; Nasopharyngeal(NP) swabs in vial transport medium  Result Value Ref Range Status   SARS Coronavirus 2 by RT PCR NEGATIVE NEGATIVE Final    Comment: (NOTE) SARS-CoV-2 target nucleic acids are NOT DETECTED.  The SARS-CoV-2 RNA is generally detectable in upper respiratory specimens during the acute phase of  infection. The lowest concentration of SARS-CoV-2 viral copies this assay can detect is 138 copies/mL. A negative result does not preclude SARS-Cov-2 infection and should not be used as the sole basis for treatment or other patient management decisions. A negative result may occur with  improper specimen collection/handling, submission of specimen other than nasopharyngeal swab, presence of viral mutation(s) within the areas targeted by this assay, and inadequate number of viral copies(<138 copies/mL). A negative result must be combined with clinical observations, patient history, and epidemiological information. The expected result is Negative.  Fact Sheet for Patients:  BloggerCourse.com  Fact Sheet for Healthcare Providers:  SeriousBroker.it  This test is no t yet approved or cleared by the [place] FDA and  has been authorized for detection and/or diagnosis of SARS-CoV-2 by FDA under an Emergency Use Authorization (EUA). This EUA will remain  in effect (meaning this test can be used) for the duration of the COVID-19  declaration under Section 564(b)(1) of the Act, 21 U.S.C.section 360bbb-3(b)(1), unless the authorization is terminated  or revoked sooner.       Influenza A by PCR NEGATIVE NEGATIVE Final   Influenza B by PCR NEGATIVE NEGATIVE Final    Comment: (NOTE) The Xpert Xpress SARS-CoV-2/FLU/RSV plus assay is intended as an aid in the diagnosis of influenza from Nasopharyngeal swab specimens and should not be used as a sole basis for treatment. Nasal washings and aspirates are unacceptable for Xpert Xpress SARS-CoV-2/FLU/RSV testing.  Fact Sheet for Patients: BloggerCourse.com  Fact Sheet for Healthcare Providers: SeriousBroker.it  This test is not yet approved or cleared by the [place] FDA and has been authorized for detection and/or diagnosis of SARS-CoV-2 by FDA under an Emergency Use Authorization (EUA). This EUA will remain in effect (meaning this test can be used) for the duration of the COVID-19 declaration under Section 564(b)(1) of the Act, 21 U.S.C. section 360bbb-3(b)(1), unless the authorization is terminated or revoked.  Performed at [facility], [address] [place], [place] [address]   MRSA Next Gen by PCR, Nasal     Status: None   Collection Time: 11/25/20  7:26 AM   Specimen: Nasal Mucosa; Nasal Swab  Result Value Ref Range Status   MRSA by PCR Next Gen NOT DETECTED NOT DETECTED Final    Comment: (NOTE) The GeneXpert MRSA Assay (FDA approved for NASAL specimens only), is one component of a comprehensive MRSA colonization surveillance program. It is not intended to diagnose MRSA infection nor to guide or monitor treatment for MRSA infections. Test performance is not FDA approved in patients less than 71 years old. Performed at [facility], [address], [place], [place] [address]          Radiology Studies: ECHOCARDIOGRAM COMPLETE  Result Date: 11/26/2020    ECHOCARDIOGRAM  REPORT   Patient Name:   LOISTINE EBERLIN Date of Exam: 11/26/2020 Medical Rec #:  [id number]      Height:       63.0 in Accession #:    [id number]     Weight:       228.2 lb Date of Birth:  1950/10/31     BSA:          2.045 m Patient Age:    69 years       BP:           115/52 mmHg Patient Gender: F              HR:  84 bpm. Exam Location:  [facility] Procedure: 2D Echo, Color Doppler and Cardiac Doppler Indications:     Dyspnea R06.00  History:         Patient has no prior history of Echocardiogram examinations.                  COPD; Risk Factors:Diabetes.  Sonographer:     Cristela Blue RDCS (AE) Referring Phys:  [id number] Inetta Fermo LAI Diagnosing Phys: Debbe Odea MD  Sonographer Comments: No apical window and no subcostal window. Image acquisition challenging due to COPD. IMPRESSIONS  1. Left ventricular ejection fraction, by estimation, is 55 to 60%. The left ventricle has normal function. The left ventricle has no regional wall motion abnormalities. There is mild left ventricular hypertrophy. Left ventricular diastolic function could not be evaluated.  2. Right ventricular systolic function is normal. The right ventricular size is normal.  3. The mitral valve is normal in structure. No evidence of mitral valve regurgitation.  4. The aortic valve is tricuspid. Aortic valve regurgitation is not visualized. FINDINGS  Left Ventricle: Left ventricular ejection fraction, by estimation, is 55 to 60%. The left ventricle has normal function. The left ventricle has no regional wall motion abnormalities. The left ventricular internal cavity size was normal in size. There is  mild left ventricular hypertrophy. Left ventricular diastolic function could not be evaluated. Right Ventricle: The right ventricular size is normal. No increase in right ventricular wall thickness. Right ventricular systolic function is normal. Left Atrium: Left atrial size was normal in size. Right Atrium: Right atrial size was not well visualized.  Pericardium: There is no evidence of pericardial effusion. Mitral Valve: The mitral valve is normal in structure. No evidence of mitral valve regurgitation. Tricuspid Valve: The tricuspid valve is grossly normal. Tricuspid valve regurgitation is not demonstrated. Aortic Valve: The aortic valve is tricuspid. Aortic valve regurgitation is not visualized. Pulmonic Valve: The pulmonic valve was not well visualized. Pulmonic valve regurgitation is not visualized. Aorta: The aortic root is normal in size and structure. Venous: The inferior vena cava was not well visualized. IAS/Shunts: No atrial level shunt detected by color flow Doppler.  LEFT VENTRICLE PLAX 2D LVIDd:         4.38 cm LVIDs:         3.13 cm LV PW:         1.33 cm LV IVS:        1.34 cm LVOT diam:     2.30 cm LVOT Area:     4.15 cm  LEFT ATRIUM         Index LA diam:    3.60 cm 1.76 cm/m                        PULMONIC VALVE AORTA                 PV Vmax:        1.02 m/s Ao Root diam: 2.70 cm PV Peak grad:   4.2 mmHg                       RVOT Peak grad: 5 mmHg   SHUNTS Systemic Diam: 2.30 cm Debbe Odea MD Electronically signed by Debbe Odea MD Signature Date/Time: 11/26/2020/1:40:28 PM    Final         Scheduled Meds:  aspirin  81 mg Oral Daily   benztropine  0.5 mg Oral BID   buPROPion  300 mg Oral Daily   divalproex  750 mg Oral QHS   feeding supplement  237 mL Oral BID BM   ipratropium-albuterol  3 mL Nebulization TID   levothyroxine  150 mcg Oral Q0600   liver oil-zinc oxide   Topical BID   melatonin  5 mg Oral QHS   multivitamin with minerals  1 tablet Oral Q1200   [START ON 11/28/2020] pantoprazole  40 mg Intravenous Q12H   risperiDONE  2 mg Oral QHS   Continuous Infusions:  ampicillin-sulbactam (UNASYN) IV 3 g (11/27/20 0801)   potassium chloride 10 mEq (11/27/20 0905)     LOS: 2 days    Time spent: 40 minutes, more than 50% of time involved in direct patient care.    Marrion Coy, MD [facility]   To contact the attending provider between 7A-7P or the covering provider during after hours 7P-7A, please log into the web site [email or web address] and access using universal [place] password for that web site. If you do not have the password, please call the hospital operator.  11/27/2020, 9:53 AM

## 2020-11-28 DIAGNOSIS — E876 Hypokalemia: Secondary | ICD-10-CM

## 2020-11-28 LAB — TYPE AND SCREEN
ABO/RH(D): B POS
Antibody Screen: POSITIVE
Unit division: 0
Unit division: 0

## 2020-11-28 LAB — BPAM RBC
Blood Product Expiration Date: 202207292359
Blood Product Expiration Date: 202207292359
Unit Type and Rh: 7300
Unit Type and Rh: 7300

## 2020-11-28 LAB — BASIC METABOLIC PANEL
Anion gap: 9 (ref 5–15)
BUN: 13 mg/dL (ref 8–23)
CO2: 28 mmol/L (ref 22–32)
Calcium: 8.9 mg/dL (ref 8.9–10.3)
Chloride: 107 mmol/L (ref 98–111)
Creatinine, Ser: 0.66 mg/dL (ref 0.44–1.00)
GFR, Estimated: >60 mL/min (ref >60)
Glucose, Bld: 142 mg/dL — ABNORMAL HIGH (ref 70–99)
Potassium: 3.1 mmol/L — ABNORMAL LOW (ref 3.5–5.1)
Sodium: 144 mmol/L (ref 135–145)

## 2020-11-28 LAB — MAGNESIUM: Magnesium: 2 mg/dL (ref 1.7–2.4)

## 2020-11-28 LAB — CBC
HCT: 34.9 % — ABNORMAL LOW (ref 36.0–46.0)
Hemoglobin: 11.3 g/dL — ABNORMAL LOW (ref 12.0–15.0)
MCH: 31.2 pg (ref 26.0–34.0)
MCHC: 32.4 g/dL (ref 30.0–36.0)
MCV: 96.4 fL (ref 80.0–100.0)
Platelets: 160 10*3/uL (ref 150–400)
RBC: 3.62 MIL/uL — ABNORMAL LOW (ref 3.87–5.11)
RDW: 14.9 % (ref 11.5–15.5)
WBC: 9.6 10*3/uL (ref 4.0–10.5)
nRBC: 0 % (ref 0.0–0.2)

## 2020-11-28 MED ORDER — SODIUM CHLORIDE 0.9 % IV SOLN
3.0000 g | Freq: Four times a day (QID) | INTRAVENOUS | Status: DC
  Filled 2020-11-28 (×2): qty 8

## 2020-11-28 MED ORDER — POTASSIUM CHLORIDE 10 MEQ/100ML IV SOLN
10.0000 meq | INTRAVENOUS | Status: AC
  Administered 2020-11-28 (×4): 10 meq via INTRAVENOUS
  Filled 2020-11-28 (×4): qty 100

## 2020-11-28 MED ORDER — SODIUM CHLORIDE 0.9 % IV SOLN
3.0000 g | Freq: Four times a day (QID) | INTRAVENOUS | Status: AC
  Administered 2020-11-28 – 2020-11-29 (×6): 3 g via INTRAVENOUS
  Filled 2020-11-28: qty 3
  Filled 2020-11-28 (×4): qty 8
  Filled 2020-11-28: qty 3

## 2020-11-28 NOTE — Progress Notes (Signed)
Pharmacy Antibiotic Note  Julie Morrow is a 70 y.o. female with medical history significant for Diabetes, bipolar mood disorder, chronic encephalopathy/dementia class III obesity, hypothyroidism, brought in by EMS from the nursing home following an episode of coffee-ground emesis.  She was found covered in coffee-ground emesis and altered.  She was hypoxic to the 50s with EMS arrived on nonrebreather.Pharmacy has been consulted for Unasyn dosing for aspiration pneumonia.   Plan: Continue Unasyn 3g q6h; scheduled to stop 7/16 to complete 5 days  Height: 5\' 3"  (160 cm) Weight: 103.5 kg (228 lb 3.2 oz) IBW/kg (Calculated) : 52.4  Temp (24hrs), Avg:98.5 F (36.9 C), Min:97.8 F (36.6 C), Max:99.6 F (37.6 C)  Recent Labs  Lab 11/25/20 0150 11/25/20 0804 11/26/20 0621 11/27/20 0627 11/28/20 0737  WBC 11.4*  --  12.4* 11.7* 9.6  CREATININE  --  0.99 1.22* 0.77 0.66  LATICACIDVEN 1.6  --   --   --   --      Estimated Creatinine Clearance: 76.3 mL/min (by C-G formula based on SCr of 0.66 mg/dL).    Allergies  Allergen Reactions   Neomycin     Antimicrobials this admission: 7/12 Unasyn >> 7/16  Microbiology results: 7/12 MRSA PCR: negative    Thank you for allowing pharmacy to be a part of this patient's care.  9/12, PharmD Clinical Pharmacist 11/28/2020 11:35 AM

## 2020-11-28 NOTE — Care Management Important Message (Signed)
Important Message  Patient Details  Name: Julie Morrow MRN: [id number] Date of Birth: 07/28/1950   Medicare Important Message Given:  Yes     [name] 11/28/2020, 11:21 AM

## 2020-11-28 NOTE — Progress Notes (Signed)
  Speech Language Pathology Treatment: Dysphagia  Patient Details Name: Julie Morrow MRN: [id number] DOB: 27-Jul-1950 Today's Date: 11/28/2020 Time: 4621-9471 SLP Time Calculation (min) (ACUTE ONLY): 17 min  Assessment / Plan / Recommendation Clinical Impression  Pt seen for ongoing dysphagia management. Pt was awake but continues to have altered mentation. Pt freely talked throughout the session but listener unable to make out any distinguishable words.   Skilled observation was provided of pt consuming puree and thin liquids via straw. Pt consumes multiple consecutive sips without any overt s/s of aspiration. Pt's vocal quality remained clear and her vitals were stable throughout. At this time, recommend upgrading pt to thin liquids via straw, remain on dysphagia 1 diet with medicine crushed in puree. St to sign off at this time.    HPI HPI: Julie Morrow is a 70 y.o. female with medical history significant for Diabetes, bipolar mood disorder, chronic encephalopathy/dementia class III obesity, hypothyroidism, brought in by EMS from the nursing home following an episode of coffee-ground emesis.  She was found covered in coffee-ground emesis and altered.  She was hypoxic to the 50s with EMS arrived on nonrebreather.  EGD was performed on 7/12, showed a Mallory-Weiss tear.      SLP Plan  All goals met       Recommendations  Diet recommendations: Dysphagia 1 (puree);Thin liquid Liquids provided via: Straw Medication Administration: Crushed with puree Supervision: Staff to assist with self feeding;Full supervision/cueing for compensatory strategies Compensations: Slow rate;Small sips/bites;Lingual sweep for clearance of pocketing Postural Changes and/or Swallow Maneuvers: Out of bed for meals;Seated upright 90 degrees                Oral Care Recommendations: Oral care BID Follow up Recommendations: None SLP Visit Diagnosis: Dysphagia, unspecified (R13.10) Plan: All goals  met       GO               Sriram Febles B. Rutherford Nail M.S., CCC-SLP, [place] Office [phone]  Stormy Fabian 11/28/2020, 3:05 PM

## 2020-11-28 NOTE — Progress Notes (Signed)
PROGRESS NOTE    Julie DamesSusan B Morrow  ZOX:[id number] DOB: 1950/08/17 DOA: 11/25/2020 PCP: Patient, No Pcp Per (Inactive)   Chief complaint.  Coffee-ground emesis. Brief Narrative:  Julie DamesSusan B Earlywine is a 70 y.o. female with medical history significant for Diabetes, bipolar mood disorder, chronic encephalopathy/dementia class III obesity, hypothyroidism, brought in by EMS from the nursing home following an episode of coffee-ground emesis.  She was found covered in coffee-ground emesis and altered.  She was hypoxic to the 50s with EMS arrived on nonrebreather. EGD was performed on 7/12, showed a Mallory-Weiss tear.  She is placed Protonix gtt. and also developed significant aspiration pneumonia and acute hypoxemia.  She is treated with Unasyn.   Assessment & Plan:   Principal Problem:   Coffee ground emesis Active Problems:   Morbid (severe) obesity due to excess calories (HCC)   Bipolar disorder, mixed (HCC)   Diabetes (HCC)   Acute respiratory failure with hypoxia (HCC)   Aspiration pneumonia (HCC)  #1.  Acute upper GI bleed secondary to Mallory-Weiss tear. Mallory-Weiss tear. Condition had improved.  Currently on IV Protonix.  #2.  Acute hypoxemic respiratory failure. Aspiration pneumonia. Dysphagia. Patient has been evaluated by speech therapist, still has risk for aspiration. Continue oxygen and wean gradually.  Continue Unasyn for total course of 5 days. Echocardiogram showed ejection fraction 60 to 65%.  3.  Dementia. Bipolar disorder. Anxiety and depression. Patient still very confused, I believe her dementia is quite severe.  #4.  Hypokalemia. Will give 40 mg IV potassium as potassium still low after IV supplements yesterday.  Patient has a poor prognosis, palliative care consult pending.    DVT prophylaxis: SCDs Code Status: full Family Communication:  Disposition Plan:    Status is: Inpatient  Remains inpatient appropriate because:IV treatments appropriate due  to intensity of illness or inability to take PO and Inpatient level of care appropriate due to severity of illness  Dispo: The patient is from: SNF              Anticipated d/c is to: SNF              Patient currently is not medically stable to d/c.   Difficult to place patient No        I/O last 3 completed shifts: In: 100 [P.O.:100] Out: 450 [Urine:450] Total I/O In: 0  Out: 150 [Urine:150]     Consultants:  None  Procedures: EGD  Antimicrobials: Unasyn.  Subjective: Patient is quite confused, she has short of breath with exertion, she is currently on 2 L oxygen.  She appeared not able to clear her upper airway. No fever or chills. No abdominal pain or nausea vomiting.  No diarrhea  No dysuria hematuria. No chest pain or palpitation.  Objective: Vitals:   11/28/20 0102 11/28/20 0326 11/28/20 0758 11/28/20 0835  BP: 131/70 121/65 109/83   Pulse: 73 78 69   Resp: 20 20 15    Temp: 99 F (37.2 C) 98.8 F (37.1 C) 97.8 F (36.6 C)   TempSrc: Oral     SpO2: 97% 94% 94% 94%  Weight:      Height:        Intake/Output Summary (Last 24 hours) at 11/28/2020 1023 Last data filed at 11/28/2020 0930 Gross per 24 hour  Intake 0 ml  Output 150 ml  Net -150 ml   Filed Weights   11/25/20 0151 11/25/20 0457  Weight: 105 kg 103.5 kg    Examination:  General exam: Morbidly  obese and confused. Respiratory system: Rhonchi in the base. Respiratory effort normal. Cardiovascular system: S1 & S2 heard, RRR. No JVD, murmurs, rubs, gallops or clicks. No pedal edema. Gastrointestinal system: Abdomen is nondistended, soft and nontender. No organomegaly or masses felt. Normal bowel sounds heard. Central nervous system: Alert and oriented x1. No focal neurological deficits. Extremities: Symmetric 5 x 5 power. Skin: No rashes, lesions or ulcers     Data Reviewed: I have personally reviewed following labs and imaging studies  CBC: Recent Labs  Lab 11/25/20 0150  11/25/20 0804 11/25/20 1316 11/26/20 0621 11/27/20 0627 11/28/20 0737  WBC 11.4*  --   --  12.4* 11.7* 9.6  NEUTROABS 9.3*  --   --   --   --   --   HGB 15.8* 15.6* 13.0 12.5 11.3* 11.3*  HCT 49.5* 46.9* 41.5 38.0 36.5 34.9*  MCV 97.4  --   --  97.4 100.6* 96.4  PLT 234  --   --  165 156 160   Basic Metabolic Panel: Recent Labs  Lab 11/25/20 0804 11/26/20 0621 11/27/20 0627 11/28/20 0737  NA 141 144 145 144  K 4.3 3.5 3.3* 3.1*  CL 105 105 108 107  CO2 27 30 27 28   GLUCOSE 182* 128* 130* 142*  BUN 17 23 20 13   CREATININE 0.99 1.22* 0.77 0.66  CALCIUM 9.2 8.9 8.7* 8.9  MG 2.0 2.0 2.1 2.0   GFR: Estimated Creatinine Clearance: 76.3 mL/min (by C-G formula based on SCr of 0.66 mg/dL). Liver Function Tests: No results for input(s): AST, ALT, ALKPHOS, BILITOT, PROT, ALBUMIN in the last 168 hours. No results for input(s): LIPASE, AMYLASE in the last 168 hours. No results for input(s): AMMONIA in the last 168 hours. Coagulation Profile: Recent Labs  Lab 11/25/20 0150  INR 1.0   Cardiac Enzymes: No results for input(s): CKTOTAL, CKMB, CKMBINDEX, TROPONINI in the last 168 hours. BNP (last 3 results) No results for input(s): PROBNP in the last 8760 hours. HbA1C: No results for input(s): HGBA1C in the last 72 hours. CBG: No results for input(s): GLUCAP in the last 168 hours. Lipid Profile: No results for input(s): CHOL, HDL, LDLCALC, TRIG, CHOLHDL, LDLDIRECT in the last 72 hours. Thyroid Function Tests: No results for input(s): TSH, T4TOTAL, FREET4, T3FREE, THYROIDAB in the last 72 hours. Anemia Panel: No results for input(s): VITAMINB12, FOLATE, FERRITIN, TIBC, IRON, RETICCTPCT in the last 72 hours. Sepsis Labs: Recent Labs  Lab 11/25/20 0150 11/25/20 0804 11/26/20 0621 11/27/20 0627  PROCALCITON  --  3.08 6.33 2.87  LATICACIDVEN 1.6  --   --   --     Recent Results (from the past 240 hour(s))  Resp Panel by RT-PCR (Flu A&B, Covid) Nasopharyngeal Swab      Status: None   Collection Time: 11/25/20  2:47 AM   Specimen: Nasopharyngeal Swab; Nasopharyngeal(NP) swabs in vial transport medium  Result Value Ref Range Status   SARS Coronavirus 2 by RT PCR NEGATIVE NEGATIVE Final    Comment: (NOTE) SARS-CoV-2 target nucleic acids are NOT DETECTED.  The SARS-CoV-2 RNA is generally detectable in upper respiratory specimens during the acute phase of infection. The lowest concentration of SARS-CoV-2 viral copies this assay can detect is 138 copies/mL. A negative result does not preclude SARS-Cov-2 infection and should not be used as the sole basis for treatment or other patient management decisions. A negative result may occur with  improper specimen collection/handling, submission of specimen other than nasopharyngeal swab, presence of viral mutation(s)  within the areas targeted by this assay, and inadequate number of viral copies(<138 copies/mL). A negative result must be combined with clinical observations, patient history, and epidemiological information. The expected result is Negative.  Fact Sheet for Patients:  BloggerCourse.com  Fact Sheet for Healthcare Providers:  SeriousBroker.it  This test is no t yet approved or cleared by the [place] FDA and  has been authorized for detection and/or diagnosis of SARS-CoV-2 by FDA under an Emergency Use Authorization (EUA). This EUA will remain  in effect (meaning this test can be used) for the duration of the COVID-19 declaration under Section 564(b)(1) of the Act, 21 U.S.C.section 360bbb-3(b)(1), unless the authorization is terminated  or revoked sooner.       Influenza A by PCR NEGATIVE NEGATIVE Final   Influenza B by PCR NEGATIVE NEGATIVE Final    Comment: (NOTE) The Xpert Xpress SARS-CoV-2/FLU/RSV plus assay is intended as an aid in the diagnosis of influenza from Nasopharyngeal swab specimens and should not be used as a sole basis  for treatment. Nasal washings and aspirates are unacceptable for Xpert Xpress SARS-CoV-2/FLU/RSV testing.  Fact Sheet for Patients: BloggerCourse.com  Fact Sheet for Healthcare Providers: SeriousBroker.it  This test is not yet approved or cleared by the [place] FDA and has been authorized for detection and/or diagnosis of SARS-CoV-2 by FDA under an Emergency Use Authorization (EUA). This EUA will remain in effect (meaning this test can be used) for the duration of the COVID-19 declaration under Section 564(b)(1) of the Act, 21 U.S.C. section 360bbb-3(b)(1), unless the authorization is terminated or revoked.  Performed at [facility], [address] [place], [place] [address]   MRSA Next Gen by PCR, Nasal     Status: None   Collection Time: 11/25/20  7:26 AM   Specimen: Nasal Mucosa; Nasal Swab  Result Value Ref Range Status   MRSA by PCR Next Gen NOT DETECTED NOT DETECTED Final    Comment: (NOTE) The GeneXpert MRSA Assay (FDA approved for NASAL specimens only), is one component of a comprehensive MRSA colonization surveillance program. It is not intended to diagnose MRSA infection nor to guide or monitor treatment for MRSA infections. Test performance is not FDA approved in patients less than 67 years old. Performed at [facility], [address], [place], [place] [address]          Radiology Studies: No results found.      Scheduled Meds:  aspirin  81 mg Oral Daily   benztropine  0.5 mg Oral BID   buPROPion  300 mg Oral Daily   divalproex  750 mg Oral QHS   feeding supplement (NEPRO CARB STEADY)  237 mL Oral BID BM   ipratropium-albuterol  3 mL Nebulization TID   levothyroxine  150 mcg Oral Q0600   liver oil-zinc oxide   Topical BID   melatonin  5 mg Oral QHS   multivitamin with minerals  1 tablet Oral Q1200   pantoprazole  40 mg Intravenous Q12H   risperiDONE  2 mg Oral QHS    sodium chloride flush  10-40 mL Intracatheter Q12H   Continuous Infusions:  ampicillin-sulbactam (UNASYN) IV 3 g (11/28/20 0845)     LOS: 3 days    Time spent: 32 minutes    Marrion Coy, MD [facility]   To contact the attending provider between 7A-7P or the covering provider during after hours 7P-7A, please log into the web site [email or web address] and access using universal [place] password for that  web site. If you do not have the password, please call the hospital operator.  11/28/2020, 10:23 AM

## 2020-11-29 LAB — CBC
HCT: 34.4 % — ABNORMAL LOW (ref 36.0–46.0)
Hemoglobin: 10.9 g/dL — ABNORMAL LOW (ref 12.0–15.0)
MCH: 30.5 pg (ref 26.0–34.0)
MCHC: 31.7 g/dL (ref 30.0–36.0)
MCV: 96.4 fL (ref 80.0–100.0)
Platelets: 166 10*3/uL (ref 150–400)
RBC: 3.57 MIL/uL — ABNORMAL LOW (ref 3.87–5.11)
RDW: 14.8 % (ref 11.5–15.5)
WBC: 7.4 10*3/uL (ref 4.0–10.5)
nRBC: 0 % (ref 0.0–0.2)

## 2020-11-29 LAB — BASIC METABOLIC PANEL
Anion gap: 8 (ref 5–15)
BUN: 12 mg/dL (ref 8–23)
CO2: 28 mmol/L (ref 22–32)
Calcium: 8.7 mg/dL — ABNORMAL LOW (ref 8.9–10.3)
Chloride: 109 mmol/L (ref 98–111)
Creatinine, Ser: 0.57 mg/dL (ref 0.44–1.00)
GFR, Estimated: >60 mL/min (ref >60)
Glucose, Bld: 114 mg/dL — ABNORMAL HIGH (ref 70–99)
Potassium: 3.1 mmol/L — ABNORMAL LOW (ref 3.5–5.1)
Sodium: 145 mmol/L (ref 135–145)

## 2020-11-29 LAB — MAGNESIUM: Magnesium: 2 mg/dL (ref 1.7–2.4)

## 2020-11-29 MED ORDER — POTASSIUM CHLORIDE 10 MEQ/100ML IV SOLN
10.0000 meq | INTRAVENOUS | Status: AC
  Administered 2020-11-29 (×4): 10 meq via INTRAVENOUS
  Filled 2020-11-29 (×4): qty 100

## 2020-11-29 MED ORDER — PANTOPRAZOLE SODIUM 40 MG PO TBEC
40.0000 mg | DELAYED_RELEASE_TABLET | Freq: Two times a day (BID) | ORAL | Status: DC
  Administered 2020-11-29 – 2020-12-02 (×6): 40 mg via ORAL
  Filled 2020-11-29 (×6): qty 1

## 2020-11-29 NOTE — Progress Notes (Signed)
PROGRESS NOTE    Julie Morrow  XBL:[id number] DOB: 1950/08/28 DOA: 11/25/2020 PCP: Patient, No Pcp Per (Inactive)   Chief complaint.  Coffee-ground emesis. Brief Narrative:  Julie Morrow is a 70 y.o. female with medical history significant for Diabetes, bipolar mood disorder, chronic encephalopathy/dementia class III obesity, hypothyroidism, brought in by EMS from the nursing home following an episode of coffee-ground emesis.  She was found covered in coffee-ground emesis and altered.  She was hypoxic to the 50s with EMS arrived on nonrebreather. EGD was performed on 7/12, showed a Mallory-Weiss tear.  She is placed Protonix gtt. and also developed significant aspiration pneumonia and acute hypoxemia.  She is treated with Unasyn.   Assessment & Plan:   Principal Problem:   Coffee ground emesis Active Problems:   Morbid (severe) obesity due to excess calories (HCC)   Acute kidney injury (HCC)   Bipolar disorder, mixed (HCC)   Diabetes (HCC)   Acute respiratory failure with hypoxia (HCC)   Aspiration pneumonia (HCC)   Hypokalemia  #1.  Acute upper GI bleed secondary to Mallory-Weiss tear. Mallory-Weiss tear. Condition had improved, hemoglobin stable.  Change PPI to oral.  #2.  Acute hypoxemic respiratory failure. Aspiration pneumonia. Dysphagia. Continue dysphagia diet. Complete 5 days of Unasyn. Condition had improved, Patient currently off oxygen.  3.  Dementia. Anxiety depression. Bipolar disorder. Patient appears to have significant confusion, this is at her baseline.  4.  Hypokalemia. Will give additional 40 mEq potassium IV.  DVT prophylaxis: SCDs Code Status: Full Family Communication:  Disposition Plan:    Status is: Inpatient  Remains inpatient appropriate because:IV treatments appropriate due to intensity of illness or inability to take PO and Inpatient level of care appropriate due to severity of illness  Dispo: The patient is from: Home               Anticipated d/c is to: SNF              Patient currently is not medically stable to d/c.   Difficult to place patient No        I/O last 3 completed shifts: In: 740 [NG/GT:240; IV Piggyback:500] Out: 1700 [Urine:1700] Total I/O In: 120 [P.O.:120] Out: -      Consultants:  None  Procedures: None  Antimicrobials:  Unasyn  Subjective: Patient still has baseline confusion, no segment short of breath.  She is tolerating diet better, no additional aspiration. No fever or chills. No dysuria hematuria    Objective: Vitals:   11/28/20 2059 11/29/20 0514 11/29/20 0804 11/29/20 1223  BP:  (!) 121/55 126/60 122/66  Pulse:  65 70 73  Resp:  16 16 18   Temp:  98.8 F (37.1 C) 99 F (37.2 C) 99 F (37.2 C)  TempSrc:  Oral Oral   SpO2: 96% 97% 95% 96%  Weight:      Height:        Intake/Output Summary (Last 24 hours) at 11/29/2020 1241 Last data filed at 11/29/2020 1000 Gross per 24 hour  Intake 860 ml  Output 1450 ml  Net -590 ml   Filed Weights   11/25/20 0151 11/25/20 0457  Weight: 105 kg 103.5 kg    Examination:  General exam: Appears calm and comfortable  Respiratory system: Clear to auscultation. Respiratory effort normal. Cardiovascular system: S1 & S2 heard, RRR. No JVD, murmurs, rubs, gallops or clicks. No pedal edema. Gastrointestinal system: Abdomen is nondistended, soft and nontender. No organomegaly or masses felt. Normal bowel sounds  heard. Central nervous system: Alert and oriented x1.  No focal neurological deficits. Extremities: Symmetric 5 x 5 power. Skin: No rashes, lesions or ulcers     Data Reviewed: I have personally reviewed following labs and imaging studies  CBC: Recent Labs  Lab 11/25/20 0150 11/25/20 0804 11/25/20 1316 11/26/20 0621 11/27/20 0627 11/28/20 0737 11/29/20 0634  WBC 11.4*  --   --  12.4* 11.7* 9.6 7.4  NEUTROABS 9.3*  --   --   --   --   --   --   HGB 15.8*   < > 13.0 12.5 11.3* 11.3* 10.9*  HCT 49.5*   <  > 41.5 38.0 36.5 34.9* 34.4*  MCV 97.4  --   --  97.4 100.6* 96.4 96.4  PLT 234  --   --  165 156 160 166   < > = values in this interval not displayed.   Basic Metabolic Panel: Recent Labs  Lab 11/25/20 0804 11/26/20 0621 11/27/20 0627 11/28/20 0737 11/29/20 0634  NA 141 144 145 144 145  K 4.3 3.5 3.3* 3.1* 3.1*  CL 105 105 108 107 109  CO2 27 30 27 28 28   GLUCOSE 182* 128* 130* 142* 114*  BUN 17 23 20 13 12   CREATININE 0.99 1.22* 0.77 0.66 0.57  CALCIUM 9.2 8.9 8.7* 8.9 8.7*  MG 2.0 2.0 2.1 2.0 2.0   GFR: Estimated Creatinine Clearance: 76.3 mL/min (by C-G formula based on SCr of 0.57 mg/dL). Liver Function Tests: No results for input(s): AST, ALT, ALKPHOS, BILITOT, PROT, ALBUMIN in the last 168 hours. No results for input(s): LIPASE, AMYLASE in the last 168 hours. No results for input(s): AMMONIA in the last 168 hours. Coagulation Profile: Recent Labs  Lab 11/25/20 0150  INR 1.0   Cardiac Enzymes: No results for input(s): CKTOTAL, CKMB, CKMBINDEX, TROPONINI in the last 168 hours. BNP (last 3 results) No results for input(s): PROBNP in the last 8760 hours. HbA1C: No results for input(s): HGBA1C in the last 72 hours. CBG: No results for input(s): GLUCAP in the last 168 hours. Lipid Profile: No results for input(s): CHOL, HDL, LDLCALC, TRIG, CHOLHDL, LDLDIRECT in the last 72 hours. Thyroid Function Tests: No results for input(s): TSH, T4TOTAL, FREET4, T3FREE, THYROIDAB in the last 72 hours. Anemia Panel: No results for input(s): VITAMINB12, FOLATE, FERRITIN, TIBC, IRON, RETICCTPCT in the last 72 hours. Sepsis Labs: Recent Labs  Lab 11/25/20 0150 11/25/20 0804 11/26/20 0621 11/27/20 0627  PROCALCITON  --  3.08 6.33 2.87  LATICACIDVEN 1.6  --   --   --     Recent Results (from the past 240 hour(s))  Resp Panel by RT-PCR (Flu A&B, Covid) Nasopharyngeal Swab     Status: None   Collection Time: 11/25/20  2:47 AM   Specimen: Nasopharyngeal Swab;  Nasopharyngeal(NP) swabs in vial transport medium  Result Value Ref Range Status   SARS Coronavirus 2 by RT PCR NEGATIVE NEGATIVE Final    Comment: (NOTE) SARS-CoV-2 target nucleic acids are NOT DETECTED.  The SARS-CoV-2 RNA is generally detectable in upper respiratory specimens during the acute phase of infection. The lowest concentration of SARS-CoV-2 viral copies this assay can detect is 138 copies/mL. A negative result does not preclude SARS-Cov-2 infection and should not be used as the sole basis for treatment or other patient management decisions. A negative result may occur with  improper specimen collection/handling, submission of specimen other than nasopharyngeal swab, presence of viral mutation(s) within the areas targeted by this  assay, and inadequate number of viral copies(<138 copies/mL). A negative result must be combined with clinical observations, patient history, and epidemiological information. The expected result is Negative.  Fact Sheet for Patients:  BloggerCourse.com  Fact Sheet for Healthcare Providers:  SeriousBroker.it  This test is no t yet approved or cleared by the [place] FDA and  has been authorized for detection and/or diagnosis of SARS-CoV-2 by FDA under an Emergency Use Authorization (EUA). This EUA will remain  in effect (meaning this test can be used) for the duration of the COVID-19 declaration under Section 564(b)(1) of the Act, 21 U.S.C.section 360bbb-3(b)(1), unless the authorization is terminated  or revoked sooner.       Influenza A by PCR NEGATIVE NEGATIVE Final   Influenza B by PCR NEGATIVE NEGATIVE Final    Comment: (NOTE) The Xpert Xpress SARS-CoV-2/FLU/RSV plus assay is intended as an aid in the diagnosis of influenza from Nasopharyngeal swab specimens and should not be used as a sole basis for treatment. Nasal washings and aspirates are unacceptable for Xpert Xpress  SARS-CoV-2/FLU/RSV testing.  Fact Sheet for Patients: BloggerCourse.com  Fact Sheet for Healthcare Providers: SeriousBroker.it  This test is not yet approved or cleared by the [place] FDA and has been authorized for detection and/or diagnosis of SARS-CoV-2 by FDA under an Emergency Use Authorization (EUA). This EUA will remain in effect (meaning this test can be used) for the duration of the COVID-19 declaration under Section 564(b)(1) of the Act, 21 U.S.C. section 360bbb-3(b)(1), unless the authorization is terminated or revoked.  Performed at [facility], [address] [place], [place] [address]   MRSA Next Gen by PCR, Nasal     Status: None   Collection Time: 11/25/20  7:26 AM   Specimen: Nasal Mucosa; Nasal Swab  Result Value Ref Range Status   MRSA by PCR Next Gen NOT DETECTED NOT DETECTED Final    Comment: (NOTE) The GeneXpert MRSA Assay (FDA approved for NASAL specimens only), is one component of a comprehensive MRSA colonization surveillance program. It is not intended to diagnose MRSA infection nor to guide or monitor treatment for MRSA infections. Test performance is not FDA approved in patients less than 61 years old. Performed at [facility], [address] [place], [place] [address]          Radiology Studies: No results found.      Scheduled Meds:  aspirin  81 mg Oral Daily   benztropine  0.5 mg Oral BID   buPROPion  300 mg Oral Daily   divalproex  750 mg Oral QHS   feeding supplement (NEPRO CARB STEADY)  237 mL Oral BID BM   ipratropium-albuterol  3 mL Nebulization TID   levothyroxine  150 mcg Oral Q0600   liver oil-zinc oxide   Topical BID   melatonin  5 mg Oral QHS   multivitamin with minerals  1 tablet Oral Q1200   pantoprazole  40 mg Intravenous Q12H   risperiDONE  2 mg Oral QHS   sodium chloride flush  10-40 mL Intracatheter Q12H   Continuous Infusions:   ampicillin-sulbactam (UNASYN) IV 3 g (11/29/20 0940)   potassium chloride 10 mEq (11/29/20 1144)     LOS: 4 days    Time spent: 27 minutes    Marrion Coy, MD [facility]   To contact the attending provider between 7A-7P or the covering provider during after hours 7P-7A, please log into the web site [email or web address] and access using universal [place] password  for that web site. If you do not have the password, please call the hospital operator.  11/29/2020, 12:41 PM

## 2020-11-30 LAB — CBC
HCT: 34 % — ABNORMAL LOW (ref 36.0–46.0)
Hemoglobin: 11.1 g/dL — ABNORMAL LOW (ref 12.0–15.0)
MCH: 31.2 pg (ref 26.0–34.0)
MCHC: 32.6 g/dL (ref 30.0–36.0)
MCV: 95.5 fL (ref 80.0–100.0)
Platelets: 165 10*3/uL (ref 150–400)
RBC: 3.56 MIL/uL — ABNORMAL LOW (ref 3.87–5.11)
RDW: 14.6 % (ref 11.5–15.5)
WBC: 9.1 10*3/uL (ref 4.0–10.5)
nRBC: 0 % (ref 0.0–0.2)

## 2020-11-30 LAB — BASIC METABOLIC PANEL
Anion gap: 6 (ref 5–15)
BUN: 12 mg/dL (ref 8–23)
CO2: 28 mmol/L (ref 22–32)
Calcium: 8.7 mg/dL — ABNORMAL LOW (ref 8.9–10.3)
Chloride: 107 mmol/L (ref 98–111)
Creatinine, Ser: 0.65 mg/dL (ref 0.44–1.00)
GFR, Estimated: >60 mL/min (ref >60)
Glucose, Bld: 101 mg/dL — ABNORMAL HIGH (ref 70–99)
Potassium: 3.3 mmol/L — ABNORMAL LOW (ref 3.5–5.1)
Sodium: 141 mmol/L (ref 135–145)

## 2020-11-30 LAB — MAGNESIUM: Magnesium: 2 mg/dL (ref 1.7–2.4)

## 2020-11-30 MED ORDER — POTASSIUM CHLORIDE 10 MEQ/100ML IV SOLN
10.0000 meq | INTRAVENOUS | Status: AC
  Administered 2020-11-30 (×4): 10 meq via INTRAVENOUS
  Filled 2020-11-30 (×4): qty 100

## 2020-11-30 MED ORDER — TAMSULOSIN HCL 0.4 MG PO CAPS
0.4000 mg | ORAL_CAPSULE | Freq: Every day | ORAL | Status: DC
  Administered 2020-11-30 – 2020-12-01 (×2): 0.4 mg via ORAL
  Filled 2020-11-30 (×2): qty 1

## 2020-11-30 NOTE — Evaluation (Signed)
Occupational Therapy Evaluation Patient Details Name: Julie Morrow MRN: [id number] DOB: 18-May-1950 Today's Date: 11/30/2020    History of Present Illness Pt is a 70 y.o. female presenting to hospital 7/12 from [facility] care with coffee-ground emesis, episode of aspiration at nursing home, and hypoxic in 50's per EMS.  Pt admitted with coffee ground emesis/upper GI bleed, acute respiratory failure with hypoxia, and aspiration pneumonitis.  S/p upper GI endoscopy 7/12 (showed irritation of GE junction c/w a Mallory-Weiss tear that is healing).  PMH includes h/o bipolar 1 disorder, anxiety, DM, depression, UTI, PAD, insomnia, COPD.   Clinical Impression   Pt seen for OT evaluation this date. Upon arrival to room, pt seated upright in semi-fowler's position, with mittens doffed (RN informed) and family friend Carollee Herter) at bedside. Pt alert and oriented to self, however unable to state name of visitor at bedside. Carollee Herter reports that pt's cognition has steadily declined over past couple of months; per OT eval (09/26/20), pt was independent with all ADL/iADLs and living at home with her 60 y/o father prior to May admission, however experienced a significant cognitive decline during hospital admission and scored 6/30 on the [facility] Mental Status Exam (SLUMS) during eval.  This author attempted to engage pt in bed-level ADLs, with pt inconsistently following 1-step instructions, and requiring multimodal cues, re-direction, and MAX A for bed-level grooming tasks. This Thereasa Parkin also attempted to engage pt in bed-level UE therapy exercises, however pt resisting all AAROM/physical assistance. Further mobility deferred in setting of poor command follow. Pt would benefit from additional skilled OT services to address impairments and functional limitations (noted below) and to maximize independence with ADLs/functional mobility. Upon discharge, recommend SNF.     Follow Up Recommendations  SNF     Equipment Recommendations  Other (comment) (defer to next venue of care)       Precautions / Restrictions Precautions Precautions: Fall Restrictions Weight Bearing Restrictions: No             ADL either performed or assessed with clinical judgement   ADL Overall ADL's : Needs assistance/impaired     Grooming: Wash/dry hands;Wash/dry face;Oral care;Maximal assistance;Bed level Grooming Details (indicate cue type and reason): In high fowler's position in bed, this author attempted to engage pt in bed-level ADLs, with pt demonstrating decreased command follow and attention, and inconsistently attempting to participate in tasks                                      Pertinent Vitals/Pain Pain Assessment: Faces Faces Pain Scale: No hurt        Extremity/Trunk Assessment Upper Extremity Assessment Upper Extremity Assessment: Difficult to assess due to impaired cognition;Generalized weakness   Lower Extremity Assessment Lower Extremity Assessment: Difficult to assess due to impaired cognition;Generalized weakness       Communication Communication Communication: Other (comment) (pt mumbling at times requiring cueing to repeat herself; appearing confused)   Cognition Arousal/Alertness: Awake/alert Behavior During Therapy: Flat affect Overall Cognitive Status: Impaired/Different from baseline Area of Impairment: Orientation;Attention;Memory;Following commands;Safety/judgement;Awareness;Problem solving                 Orientation Level: Disoriented to;Place;Time;Situation Current Attention Level: Selective Memory: Decreased short-term memory Following Commands: Follows one step commands inconsistently Safety/Judgement: Decreased awareness of deficits Awareness: Intellectual Problem Solving: Slow processing;Decreased initiation;Difficulty sequencing;Requires verbal cues;Requires tactile cues General Comments: Pt alert and oriented to self, however  unable to state name of visitor Carollee Herter) at bedside. Pt inconsistently following 1-step instructions, and requiring multimodal cues and re-direction for participating throughout session. Pt mumbling throughout      Exercises Other Exercises Other Exercises: Attempted to engage pt in bed-level UE therapy exercises, however pt resisting all physical assistance from OT        Home Living Family/patient expects to be discharged to:: Skilled nursing facility                                 Additional Comments: Pt unable to verbalize home set-up; per chart pt was at [facility] for last 2 weeks (was home alone prior)      Prior Functioning/Environment          Comments: Visitor at bedside Carollee Herter, who is a family friend) reports that pt's cognition has steadily declined over past couple of months. Per OT eval (09/26/20), pt was independent with all ADL/iADLs and living at home with her 56 y/o father prior to admission, however experienced a significant cognitive decline during hospital admission and scoring 6/30 on the [facility] Mental Status Exam (SLUMS) during eval. Per PT eval 09/22/20 pt was modified independent with functional mobility but then pt requiring significant increased assist (SNF recommended).        OT Problem List: Decreased strength;Decreased activity tolerance;Impaired balance (sitting and/or standing);Decreased cognition      OT Treatment/Interventions: Self-care/ADL training;Therapeutic exercise;Energy conservation;DME and/or AE instruction;Therapeutic activities;Patient/family education;Balance training    OT Goals(Current goals can be found in the care plan section) Acute Rehab OT Goals OT Goal Formulation: Patient unable to participate in goal setting Time For Goal Achievement: 12/14/20 ADL Goals Pt Will Perform Grooming: with mod assist;bed level Pt Will Perform Upper Body Dressing: with mod assist;bed level Additional ADL  Goal #1: pt will following 30% of simple 1-step instructions  OT Frequency: Min 1X/week    AM-PAC OT "6 Clicks" Daily Activity     Outcome Measure Help from another person eating meals?: A Lot Help from another person taking care of personal grooming?: A Lot Help from another person toileting, which includes using toliet, bedpan, or urinal?: Total Help from another person bathing (including washing, rinsing, drying)?: Total Help from another person to put on and taking off regular upper body clothing?: A Lot Help from another person to put on and taking off regular lower body clothing?: Total 6 Click Score: 9   End of Session Equipment Utilized During Treatment: Oxygen Nurse Communication: Mobility status;Other (comment) (mitts doffed upon arrival to room)  Activity Tolerance: Patient tolerated treatment well Patient left: in bed;with call bell/phone within reach;with bed alarm set;with family/visitor present  OT Visit Diagnosis: Muscle weakness (generalized) (M62.81);Other symptoms and signs involving cognitive function                Time: 6761-9509 OT Time Calculation (min): 23 min Charges:  OT General Charges $OT Visit: 1 Visit OT Evaluation $OT Eval Moderate Complexity: 1 Mod OT Treatments $Self Care/Home Management : 8-22 mins  Matthew Folks, OTR/L ASCOM [phone]

## 2020-11-30 NOTE — Evaluation (Signed)
Physical Therapy Evaluation Patient Details Name: Julie Morrow MRN: [id number] DOB: 1950/09/28 Today's Date: 11/30/2020   History of Present Illness  Pt is a 70 y.o. female presenting to hospital 7/12 from [facility] care with coffee-ground emesis, episode of aspiration at nursing home, and hypoxic in 50's per EMS.  Pt admitted with coffee ground emesis/upper GI bleed, acute respiratory failure with hypoxia, and aspiration pneumonitis.  S/p upper GI endoscopy 7/12 (showed irritation of GE junction c/w a Mallory-Weiss tear that is healing).  PMH includes h/o bipolar 1 disorder, anxiety, DM, depression, UTI, PAD, insomnia, COPD.  Clinical Impression  Prior to recent hospital admissions, pt appears to have been ambulatory (per notes); lives alone per [facility] note.  Currently pt is max assist with bed mobility and initially requiring max assist for sitting balance progressing to min assist.  Pt only oriented to name/DOB; inconsistent with following 1 step cues; and appearing confused based on pt's conversation and answers to therapists questions.  Increased time to follow cues required.  Pt would benefit from skilled PT to address noted impairments and functional limitations (see below for any additional details).  Upon hospital discharge, pt would benefit from SNF.    Follow Up Recommendations SNF    Equipment Recommendations  Other (comment) (TBD at next facility)    Recommendations for Other Services       Precautions / Restrictions Precautions Precautions: Fall Restrictions Weight Bearing Restrictions: No      Mobility  Bed Mobility Overal bed mobility: Needs Assistance Bed Mobility: Supine to Sit;Sit to Supine     Supine to sit: Max assist;HOB elevated Sit to supine: Max assist;HOB elevated   General bed mobility comments: assist for trunk and B LE's (pt assisting some with LE's and trunk); vc's for technique; 2 assist to boost pt up in bed end of session    Transfers                  General transfer comment: Deferred d/t safety concerns (requiring min assist for sitting balance and pt with difficulty following 1 step cueing)  Ambulation/Gait                Stairs            Wheelchair Mobility    Modified Rankin (Stroke Patients Only)       Balance Overall balance assessment: Needs assistance Sitting-balance support: Bilateral upper extremity supported;Feet supported Sitting balance-Leahy Scale: Poor Sitting balance - Comments: initially max assist for sitting balance progressing to min assist Postural control: Posterior lean                                   Pertinent Vitals/Pain Pain Assessment: Faces Faces Pain Scale: No hurt Pain Intervention(s): Limited activity within patient's tolerance;Monitored during session;Repositioned Vitals (HR and O2 on 2 L via nasal cannula) stable and WFL throughout treatment session.    Home Living Family/patient expects to be discharged to:: Private residence                 Additional Comments: Pt unable to verbalize home set-up; per chart pt was at [facility] for last 2 weeks (was home alone prior)    Prior Function           Comments: Per PT eval 09/22/20 pt was modified independent with functional mobility but then pt requiring significant increased assist (SNF recommended)  Hand Dominance        Extremity/Trunk Assessment   Upper Extremity Assessment Upper Extremity Assessment: Difficult to assess due to impaired cognition;Generalized weakness    Lower Extremity Assessment Lower Extremity Assessment: Difficult to assess due to impaired cognition;Generalized weakness    Cervical / Trunk Assessment Cervical / Trunk Assessment: Other exceptions Cervical / Trunk Exceptions: forward head/shoulders  Communication   Communication:  (pt mumbling at times requiring cueing to repeat herself; appearing confused)  Cognition Arousal/Alertness:  Awake/alert Behavior During Therapy: Flat affect Overall Cognitive Status: Impaired/Different from baseline Area of Impairment: Orientation;Attention;Memory;Following commands;Safety/judgement;Awareness;Problem solving                 Orientation Level: Disoriented to;Place;Time;Situation Current Attention Level: Selective Memory: Decreased short-term memory Following Commands: Follows one step commands inconsistently;Follows one step commands with increased time Safety/Judgement: Decreased awareness of deficits Awareness: Intellectual Problem Solving: Slow processing;Decreased initiation;Difficulty sequencing;Requires verbal cues;Requires tactile cues        General Comments  Nursing cleared pt for participation in physical therapy.  Pt agreeable to PT session.    Exercises  Bed mobility; sitting balance   Assessment/Plan    PT Assessment Patient needs continued PT services  PT Problem List Decreased strength;Decreased activity tolerance;Decreased balance;Decreased mobility;Decreased cognition;Decreased knowledge of use of DME;Cardiopulmonary status limiting activity;Decreased safety awareness;Decreased knowledge of precautions;Obesity       PT Treatment Interventions DME instruction;Gait training;Functional mobility training;Therapeutic activities;Therapeutic exercise;Balance training;Patient/family education    PT Goals (Current goals can be found in the Care Plan section)  Acute Rehab PT Goals Patient Stated Goal: pt did not state any goals PT Goal Formulation: Patient unable to participate in goal setting Time For Goal Achievement: 12/14/20 Potential to Achieve Goals: Fair    Frequency Min 2X/week   Barriers to discharge Decreased caregiver support      Co-evaluation               AM-PAC PT "6 Clicks" Mobility  Outcome Measure Help needed turning from your back to your side while in a flat bed without using bedrails?: A Lot Help needed moving from  lying on your back to sitting on the side of a flat bed without using bedrails?: A Lot Help needed moving to and from a bed to a chair (including a wheelchair)?: Total Help needed standing up from a chair using your arms (e.g., wheelchair or bedside chair)?: Total Help needed to walk in hospital room?: Total Help needed climbing 3-5 steps with a railing? : Total 6 Click Score: 8    End of Session Equipment Utilized During Treatment: Oxygen (2 L O2 via nasal cannula) Activity Tolerance: Patient limited by fatigue Patient left: in bed;with call bell/phone within reach;with bed alarm set;with SCD's reapplied Nurse Communication: Mobility status;Precautions PT Visit Diagnosis: Other abnormalities of gait and mobility (R26.89);Muscle weakness (generalized) (M62.81);Difficulty in walking, not elsewhere classified (R26.2)    Time: 9924-2683 PT Time Calculation (min) (ACUTE ONLY): 21 min   Charges:   PT Evaluation $PT Eval Low Complexity: 1 Low PT Treatments $Therapeutic Activity: 8-22 mins       Hendricks Limes, PT 11/30/20, 11:42 AM

## 2020-11-30 NOTE — NC FL2 (Signed)
[place] MEDICAID FL2 LEVEL OF CARE SCREENING TOOL     IDENTIFICATION  Patient Name: Julie Morrow Birthdate: 1951-01-15 Sex: female Admission Date (Current Location): 11/25/2020  [facility] and [place] Number:  Chiropodist and Address:  [facility], [address] [place], [place] [address]      Provider Number: [id number]  Attending Physician Name and Address:  Marrion Coy, MD  Relative Name and Phone Number:  [name] (sister) [phone]    Current Level of Care: Hospital Recommended Level of Care: Skilled Nursing Facility Prior Approval Number:    Date Approved/Denied:   PASRR Number:    Discharge Plan: SNF    Current Diagnoses: Patient Active Problem List   Diagnosis Date Noted   Hypokalemia 11/28/2020   Coffee ground emesis 11/25/2020   Acute respiratory failure with hypoxia (HCC) 11/25/2020   Aspiration pneumonia (HCC) 11/25/2020   FTT (failure to thrive) in adult 10/08/2020   Depression 10/07/2020   Thyroid disease 10/07/2020   Hyperlipidemia 10/07/2020   Failure to thrive in adult 10/07/2020   Acute encephalopathy 10/07/2020   Major neurocognitive disorder (HCC) 09/27/2020   Diabetes (HCC) 09/23/2020   Bipolar disorder, mixed (HCC) 09/19/2020   Adjustment disorder with anxiety 09/13/2020   Family discord 12/05/2019   AMS (altered mental status) 05/14/2019   Acute lower UTI 05/14/2019   Adjustment disorder with mixed disturbance of emotions and conduct    Bipolar I disorder with mania (HCC) 12/27/2018   PAD (peripheral artery disease) (HCC) 10/16/2018   GAD (generalized anxiety disorder) 03/04/2018   Insomnia 03/04/2018   Situational depression 03/04/2018   Bipolar depression (HCC) 03/04/2018   Acute kidney injury (HCC) 05/30/2017   Bradycardia    Lithium toxicity    COPD GOLD 0 / still smoking  12/22/2016   Generalized weakness 10/27/2015   GERD (gastroesophageal reflux disease) 10/15/2015   Bipolar 1  disorder, mixed, severe (HCC) 10/11/2015   Diabetes type 2, controlled (HCC) 07/07/2015   Morbid (severe) obesity due to excess calories (HCC) 07/07/2015   Cigarette smoker 07/07/2015   Hypothyroidism 07/07/2015   HLD (hyperlipidemia) 11/07/2014   Psoriasis 11/07/2014    Orientation RESPIRATION BLADDER Height & Weight     Self  O2 (2L nasal cannula) Incontinent, External catheter Weight: 228 lb 3.2 oz (103.5 kg) Height:  5\' 3"  (160 cm)  BEHAVIORAL SYMPTOMS/MOOD NEUROLOGICAL BOWEL NUTRITION STATUS      Continent Diet (see discharge summary)  AMBULATORY STATUS COMMUNICATION OF NEEDS Skin   Extensive Assist Verbally Other (Comment) (MASD Sacrum)                       Personal Care Assistance Level of Assistance  Bathing, Feeding, Dressing, Total care Bathing Assistance: Maximum assistance Feeding assistance: Maximum assistance Dressing Assistance: Maximum assistance Total Care Assistance: Maximum assistance   Functional Limitations Info  Sight, Hearing, Speech Sight Info: Adequate Hearing Info: Adequate Speech Info: Adequate    SPECIAL CARE FACTORS FREQUENCY  PT (By licensed PT), OT (By licensed OT)     PT Frequency: min 4x weekly OT Frequency: min 4x weekly            Contractures Contractures Info: Not present    Additional Factors Info  Code Status, Allergies Code Status Info: full Allergies Info: Neomycin           Current Medications (11/30/2020):  This is the current hospital active medication list Current Facility-Administered Medications  Medication Dose Route Frequency Provider  Last Rate Last Admin   aspirin chewable tablet 81 mg  81 mg Oral Daily Darlin Priestly, MD   81 mg at 11/30/20 0817   benztropine (COGENTIN) tablet 0.5 mg  0.5 mg Oral BID Darlin Priestly, MD   0.5 mg at 11/30/20 0817   buPROPion (WELLBUTRIN XL) 24 hr tablet 300 mg  300 mg Oral Daily Darlin Priestly, MD   300 mg at 11/30/20 0817   divalproex (DEPAKOTE ER) 24 hr tablet 750 mg  750 mg Oral  QHS Darlin Priestly, MD   750 mg at 11/29/20 2037   feeding supplement (NEPRO CARB STEADY) liquid 237 mL  237 mL Oral BID BM Marrion Coy, MD   237 mL at 11/30/20 0823   ipratropium-albuterol (DUONEB) 0.5-2.5 (3) MG/3ML nebulizer solution 3 mL  3 mL Nebulization TID Darlin Priestly, MD   3 mL at 11/30/20 0740   levothyroxine (SYNTHROID) tablet 150 mcg  150 mcg Oral Q0600 Darlin Priestly, MD   150 mcg at 11/30/20 0529   liver oil-zinc oxide (DESITIN) 40 % ointment   Topical BID Midge Minium, MD   Given at 11/30/20 7026   melatonin tablet 5 mg  5 mg Oral QHS Darlin Priestly, MD   5 mg at 11/29/20 2051   multivitamin with minerals tablet 1 tablet  1 tablet Oral Q1200 Marrion Coy, MD   1 tablet at 11/29/20 1636   ondansetron (ZOFRAN) tablet 4 mg  4 mg Oral Q6H PRN Midge Minium, MD       Or   ondansetron (ZOFRAN) injection 4 mg  4 mg Intravenous Q6H PRN Midge Minium, MD       pantoprazole (PROTONIX) EC tablet 40 mg  40 mg Oral BID Marrion Coy, MD   40 mg at 11/30/20 0817   potassium chloride 10 mEq in 100 mL IVPB  10 mEq Intravenous Q1 Hr x 4 Marrion Coy, MD 100 mL/hr at 11/30/20 0821 10 mEq at 11/30/20 0821   risperiDONE (RISPERDAL) tablet 2 mg  2 mg Oral QHS Darlin Priestly, MD   2 mg at 11/29/20 2038   sodium chloride flush (NS) 0.9 % injection 10-40 mL  10-40 mL Intracatheter Q12H Marrion Coy, MD   10 mL at 11/30/20 3785   sodium chloride flush (NS) 0.9 % injection 10-40 mL  10-40 mL Intracatheter PRN Marrion Coy, MD         Discharge Medications: Please see discharge summary for a list of discharge medications.  Relevant Imaging Results:  Relevant Lab Results:   Additional Information SSN: [id number]  Gildardo Griffes, LCSW

## 2020-11-30 NOTE — TOC Initial Note (Signed)
Transition of Care [facility]) - Initial/Assessment Note    Patient Details  Name: Julie Morrow MRN: [id number] Date of Birth: 1951-03-15  Transition of Care [facility]) CM/SW Contact:    Gildardo Griffes, LCSW Phone Number: 11/30/2020, 8:49 AM  Clinical Narrative:                  CSW spoke with patient's sister [name] due to patient notably being disoriented. Confirms she's from [facility]. [name] reports would be agreeable if recommended SNF again as discharge venue, however would like CSW to fax out referrals for other potential bed offers.   Per [name] patient was at [facility] for the past 2 weeks, prior to that was from home alone.   CSW has requested PT/OT to be ordered to assess appropriate level of care needed at time of discharge.     Expected Discharge Plan: Skilled Nursing Facility Barriers to Discharge: Continued Medical Work up   Patient Goals and CMS Choice   CMS Medicare.gov Compare Post Acute Care list provided to:: Patient Represenative (must comment) (sister [name]) Choice offered to / list presented to : Sibling  Expected Discharge Plan and Services Expected Discharge Plan: Skilled Nursing Facility     Post Acute Care Choice: Skilled Nursing Facility Living arrangements for the past 2 months: Single Family Home                                      Prior Living Arrangements/Services Living arrangements for the past 2 months: Single Family Home Lives with:: Self Patient language and need for interpreter reviewed:: Yes        Need for Family Participation in Patient Care: Yes (Comment) Care giver support system in place?: Yes (comment)   Criminal Activity/Legal Involvement Pertinent to Current Situation/Hospitalization: No - Comment as needed  Activities of Daily Living      Permission Sought/Granted   Permission granted to share information with : Yes, Verbal Permission Granted  Share Information with NAME: Maxwell Marion  Permission granted to share info w AGENCY: SNFs  Permission granted to share info w Relationship: sister  Permission granted to share info w Contact Information: [phone]  Emotional Assessment Appearance:: Appears stated age       Alcohol / Substance Use: Not Applicable Psych Involvement: No (comment)  Admission diagnosis:  Upper GI bleed [K92.2] Coffee ground emesis [K92.0] Acute respiratory failure with hypoxia (HCC) [J96.01] Aspiration pneumonia due to gastric secretions, unspecified laterality, unspecified part of lung (HCC) [J69.0] Patient Active Problem List   Diagnosis Date Noted   Hypokalemia 11/28/2020   Coffee ground emesis 11/25/2020   Acute respiratory failure with hypoxia (HCC) 11/25/2020   Aspiration pneumonia (HCC) 11/25/2020   FTT (failure to thrive) in adult 10/08/2020   Depression 10/07/2020   Thyroid disease 10/07/2020   Hyperlipidemia 10/07/2020   Failure to thrive in adult 10/07/2020   Acute encephalopathy 10/07/2020   Major neurocognitive disorder (HCC) 09/27/2020   Diabetes (HCC) 09/23/2020   Bipolar disorder, mixed (HCC) 09/19/2020   Adjustment disorder with anxiety 09/13/2020   Family discord 12/05/2019   AMS (altered mental status) 05/14/2019   Acute lower UTI 05/14/2019   Adjustment disorder with mixed disturbance of emotions and conduct    Bipolar I disorder with mania (HCC) 12/27/2018   PAD (peripheral artery disease) (HCC) 10/16/2018   GAD (generalized anxiety disorder) 03/04/2018  Insomnia 03/04/2018   Situational depression 03/04/2018   Bipolar depression (HCC) 03/04/2018   Acute kidney injury (HCC) 05/30/2017   Bradycardia    Lithium toxicity    COPD GOLD 0 / still smoking  12/22/2016   Generalized weakness 10/27/2015   GERD (gastroesophageal reflux disease) 10/15/2015   Bipolar 1 disorder, mixed, severe (HCC) 10/11/2015   Diabetes type 2, controlled (HCC) 07/07/2015   Morbid (severe) obesity due to excess calories (HCC)  07/07/2015   Cigarette smoker 07/07/2015   Hypothyroidism 07/07/2015   HLD (hyperlipidemia) 11/07/2014   Psoriasis 11/07/2014   PCP:  Patient, No Pcp Per (Inactive) Pharmacy:   [id number] [place], [place] - [address] AT [facility] [place] [facility] [address] [place] [address] Phone: [phone] Fax: [phone]     Social Determinants of Health (SDOH) Interventions    Readmission Risk Interventions No flowsheet data found.

## 2020-11-30 NOTE — Progress Notes (Signed)
PROGRESS NOTE    Julie DamesSusan B Morrow  ZOX:[id number] DOB: [birth year] DOA: 11/25/2020 PCP: Patient, No Pcp Per (Inactive)   Chief complaint.  Coffee-ground emesis Brief Narrative:  Julie Morrow is a 70 y.o. female with medical history significant for Diabetes, bipolar mood disorder, chronic encephalopathy/dementia class III obesity, hypothyroidism, brought in by EMS from the nursing home following an episode of coffee-ground emesis.  She was found covered in coffee-ground emesis and altered.  She was hypoxic to the 50s with EMS arrived on nonrebreather. EGD was performed on 7/12, showed a Mallory-Weiss tear.  She is placed Protonix gtt. and also developed significant aspiration pneumonia and acute hypoxemia.  She is treated with Unasyn.   Assessment & Plan:   Principal Problem:   Coffee ground emesis Active Problems:   Morbid (severe) obesity due to excess calories (HCC)   Acute kidney injury (HCC)   Bipolar disorder, mixed (HCC)   Diabetes (HCC)   Acute respiratory failure with hypoxia (HCC)   Aspiration pneumonia (HCC)   Hypokalemia  #1.  Acute upper GI bleed secondary to Mallory-Weiss tear. Mallory-Weiss tear. Condition had improved, continue PPI orally.  #2.  Acute hypoxemic respiratory failure. Aspiration pneumonia. Dysphagia. Continue dysphagia diet. Antibiotics completed, patient currently on on 2 L oxygen.  She has been evaluated by speech therapy, deemed to be a mild risk for aspiration.  However, patient is not able to swallow properly, she pocket her p.o., food in her mouth.  Clinically, she has a high risk for aspiration. Long-term prognosis very poor, she can have recurrent aspiration pneumonia. Palliative care consulted.  3.  Dementia. Anxiety depression. Bipolar disorder. Patient is seem to have a significant dementia, continue home medicines.  4.  Hypokalemia. Patient had a persistent hypokalemia, will give additional 40 mg IV potassium today.  DVT  prophylaxis: SCDs Code Status: full Family Communication:  Disposition Plan:    Status is: Inpatient  Remains inpatient appropriate because:Unsafe d/c plan  Dispo: The patient is from: Home              Anticipated d/c is to: SNF              Patient currently is medically stable to d/c.   Difficult to place patient No        I/O last 3 completed shifts: In: 1635.1 [P.O.:840; IV Piggyback:795.1] Out: 800 [Urine:800] Total I/O In: 120 [P.O.:120] Out: -      Consultants:  Pulm  Procedures: None  Antimicrobials: None   Subjective: Patient continues to be confused, this is her baseline. Was pocketing pills, not able to swallow. She does not have a secondary short of breath today, she is still on 2 L oxygen.  She has no cough. No fever or chills. No dysuria hematuria.   Objective: Vitals:   11/29/20 1223 11/29/20 1639 11/29/20 1918 11/30/20 0420  BP: 122/66 128/90 138/73 92/60  Pulse: 73 72 73 66  Resp: 18 18 16 20   Temp: 99 F (37.2 C) 98.4 F (36.9 C) 98.8 F (37.1 C) 98.4 F (36.9 C)  TempSrc:      SpO2: 96% 99% 96% 98%  Weight:      Height:        Intake/Output Summary (Last 24 hours) at 11/30/2020 0951 Last data filed at 11/30/2020 0903 Gross per 24 hour  Intake 1755.11 ml  Output --  Net 1755.11 ml   Filed Weights   11/25/20 0151 11/25/20 0457  Weight: 105 kg 103.5 kg  Examination:  General exam: Morbid obese Respiratory system: Rhonchi in the base, respiratory effort normal. Cardiovascular system: S1 & S2 heard, RRR. No JVD, murmurs, rubs, gallops or clicks. No pedal edema. Gastrointestinal system: Abdomen is nondistended, soft and nontender. No organomegaly or masses felt. Normal bowel sounds heard. Central nervous system: Alert and oriented x1. No focal neurological deficits. Extremities: Symmetric 5 x 5 power. Skin: No rashes, lesions or ulcers     Data Reviewed: I have personally reviewed following labs and imaging  studies  CBC: Recent Labs  Lab 11/25/20 0150 11/25/20 0804 11/26/20 0621 11/27/20 0627 11/28/20 0737 11/29/20 0634 11/30/20 0443  WBC 11.4*  --  12.4* 11.7* 9.6 7.4 9.1  NEUTROABS 9.3*  --   --   --   --   --   --   HGB 15.8*   < > 12.5 11.3* 11.3* 10.9* 11.1*  HCT 49.5*   < > 38.0 36.5 34.9* 34.4* 34.0*  MCV 97.4  --  97.4 100.6* 96.4 96.4 95.5  PLT 234  --  165 156 160 166 165   < > = values in this interval not displayed.   Basic Metabolic Panel: Recent Labs  Lab 11/26/20 0621 11/27/20 0627 11/28/20 0737 11/29/20 0634 11/30/20 0443  NA 144 145 144 145 141  K 3.5 3.3* 3.1* 3.1* 3.3*  CL 105 108 107 109 107  CO2 30 27 28 28 28   GLUCOSE 128* 130* 142* 114* 101*  BUN 23 20 13 12 12   CREATININE 1.22* 0.77 0.66 0.57 0.65  CALCIUM 8.9 8.7* 8.9 8.7* 8.7*  MG 2.0 2.1 2.0 2.0 2.0   GFR: Estimated Creatinine Clearance: 76.3 mL/min (by C-G formula based on SCr of 0.65 mg/dL). Liver Function Tests: No results for input(s): AST, ALT, ALKPHOS, BILITOT, PROT, ALBUMIN in the last 168 hours. No results for input(s): LIPASE, AMYLASE in the last 168 hours. No results for input(s): AMMONIA in the last 168 hours. Coagulation Profile: Recent Labs  Lab 11/25/20 0150  INR 1.0   Cardiac Enzymes: No results for input(s): CKTOTAL, CKMB, CKMBINDEX, TROPONINI in the last 168 hours. BNP (last 3 results) No results for input(s): PROBNP in the last 8760 hours. HbA1C: No results for input(s): HGBA1C in the last 72 hours. CBG: No results for input(s): GLUCAP in the last 168 hours. Lipid Profile: No results for input(s): CHOL, HDL, LDLCALC, TRIG, CHOLHDL, LDLDIRECT in the last 72 hours. Thyroid Function Tests: No results for input(s): TSH, T4TOTAL, FREET4, T3FREE, THYROIDAB in the last 72 hours. Anemia Panel: No results for input(s): VITAMINB12, FOLATE, FERRITIN, TIBC, IRON, RETICCTPCT in the last 72 hours. Sepsis Labs: Recent Labs  Lab 11/25/20 0150 11/25/20 0804 11/26/20 0621  11/27/20 0627  PROCALCITON  --  3.08 6.33 2.87  LATICACIDVEN 1.6  --   --   --     Recent Results (from the past 240 hour(s))  Resp Panel by RT-PCR (Flu A&B, Covid) Nasopharyngeal Swab     Status: None   Collection Time: 11/25/20  2:47 AM   Specimen: Nasopharyngeal Swab; Nasopharyngeal(NP) swabs in vial transport medium  Result Value Ref Range Status   SARS Coronavirus 2 by RT PCR NEGATIVE NEGATIVE Final    Comment: (NOTE) SARS-CoV-2 target nucleic acids are NOT DETECTED.  The SARS-CoV-2 RNA is generally detectable in upper respiratory specimens during the acute phase of infection. The lowest concentration of SARS-CoV-2 viral copies this assay can detect is 138 copies/mL. A negative result does not preclude SARS-Cov-2 infection and should  not be used as the sole basis for treatment or other patient management decisions. A negative result may occur with  improper specimen collection/handling, submission of specimen other than nasopharyngeal swab, presence of viral mutation(s) within the areas targeted by this assay, and inadequate number of viral copies(<138 copies/mL). A negative result must be combined with clinical observations, patient history, and epidemiological information. The expected result is Negative.  Fact Sheet for Patients:  BloggerCourse.com  Fact Sheet for Healthcare Providers:  SeriousBroker.it  This test is no t yet approved or cleared by the [place] FDA and  has been authorized for detection and/or diagnosis of SARS-CoV-2 by FDA under an Emergency Use Authorization (EUA). This EUA will remain  in effect (meaning this test can be used) for the duration of the COVID-19 declaration under Section 564(b)(1) of the Act, 21 U.S.C.section 360bbb-3(b)(1), unless the authorization is terminated  or revoked sooner.       Influenza A by PCR NEGATIVE NEGATIVE Final   Influenza B by PCR NEGATIVE NEGATIVE Final     Comment: (NOTE) The Xpert Xpress SARS-CoV-2/FLU/RSV plus assay is intended as an aid in the diagnosis of influenza from Nasopharyngeal swab specimens and should not be used as a sole basis for treatment. Nasal washings and aspirates are unacceptable for Xpert Xpress SARS-CoV-2/FLU/RSV testing.  Fact Sheet for Patients: BloggerCourse.com  Fact Sheet for Healthcare Providers: SeriousBroker.it  This test is not yet approved or cleared by the [place] FDA and has been authorized for detection and/or diagnosis of SARS-CoV-2 by FDA under an Emergency Use Authorization (EUA). This EUA will remain in effect (meaning this test can be used) for the duration of the COVID-19 declaration under Section 564(b)(1) of the Act, 21 U.S.C. section 360bbb-3(b)(1), unless the authorization is terminated or revoked.  Performed at [facility], [address] [place], [place] [address]   MRSA Next Gen by PCR, Nasal     Status: None   Collection Time: 11/25/20  7:26 AM   Specimen: Nasal Mucosa; Nasal Swab  Result Value Ref Range Status   MRSA by PCR Next Gen NOT DETECTED NOT DETECTED Final    Comment: (NOTE) The GeneXpert MRSA Assay (FDA approved for NASAL specimens only), is one component of a comprehensive MRSA colonization surveillance program. It is not intended to diagnose MRSA infection nor to guide or monitor treatment for MRSA infections. Test performance is not FDA approved in patients less than 18 years old. Performed at [facility], [address], [place], [place] [address]          Radiology Studies: No results found.      Scheduled Meds:  aspirin  81 mg Oral Daily   benztropine  0.5 mg Oral BID   buPROPion  300 mg Oral Daily   divalproex  750 mg Oral QHS   feeding supplement (NEPRO CARB STEADY)  237 mL Oral BID BM   ipratropium-albuterol  3 mL Nebulization TID   levothyroxine  150 mcg Oral  Q0600   liver oil-zinc oxide   Topical BID   melatonin  5 mg Oral QHS   multivitamin with minerals  1 tablet Oral Q1200   pantoprazole  40 mg Oral BID   risperiDONE  2 mg Oral QHS   sodium chloride flush  10-40 mL Intracatheter Q12H   Continuous Infusions:  potassium chloride 10 mEq (11/30/20 0821)     LOS: 5 days    Time spent: 28 minutes    Marrion Coy, MD [facility]   To contact the attending provider between 7A-7P or the covering provider during after hours 7P-7A, please log into the web site [email or web address] and access using universal [place] password for that web site. If you do not have the password, please call the hospital operator.  11/30/2020, 9:51 AM

## 2020-12-01 DIAGNOSIS — Z7189 Other specified counseling: Secondary | ICD-10-CM

## 2020-12-01 LAB — BASIC METABOLIC PANEL
Anion gap: 8 (ref 5–15)
BUN: 15 mg/dL (ref 8–23)
CO2: 27 mmol/L (ref 22–32)
Calcium: 8.9 mg/dL (ref 8.9–10.3)
Chloride: 105 mmol/L (ref 98–111)
Creatinine, Ser: 0.72 mg/dL (ref 0.44–1.00)
GFR, Estimated: >60 mL/min (ref >60)
Glucose, Bld: 137 mg/dL — ABNORMAL HIGH (ref 70–99)
Potassium: 3.4 mmol/L — ABNORMAL LOW (ref 3.5–5.1)
Sodium: 140 mmol/L (ref 135–145)

## 2020-12-01 LAB — MAGNESIUM: Magnesium: 2 mg/dL (ref 1.7–2.4)

## 2020-12-01 LAB — SARS CORONAVIRUS 2 (TAT 6-24 HRS): SARS Coronavirus 2: NEGATIVE

## 2020-12-01 MED ORDER — POTASSIUM CHLORIDE 10 MEQ/100ML IV SOLN
10.0000 meq | INTRAVENOUS | Status: AC
  Administered 2020-12-01 (×4): 10 meq via INTRAVENOUS
  Filled 2020-12-01 (×3): qty 100

## 2020-12-01 NOTE — TOC Progression Note (Signed)
Transition of Care [facility]) - Progression Note    Patient Details  Name: Julie Morrow MRN: [id number] Date of Birth: [birth year]  Transition of Care [facility]) CM/SW Contact  Chapman Fitch, RN Phone Number: 12/01/2020, 10:31 AM  Clinical Narrative:    No new bed offers Sent secure chat message to MD requesting Fl2 to be signed in order to submit to [place] must    Expected Discharge Plan: Skilled Nursing Facility Barriers to Discharge: Continued Medical Work up  Expected Discharge Plan and Services Expected Discharge Plan: Skilled Nursing Facility     Post Acute Care Choice: Skilled Nursing Facility Living arrangements for the past 2 months: Single Family Home                                       Social Determinants of Health (SDOH) Interventions    Readmission Risk Interventions No flowsheet data found.

## 2020-12-01 NOTE — TOC Progression Note (Signed)
Transition of Care [facility]) - Progression Note    Patient Details  Name: ELAYJAH CHANEY MRN: [id number] Date of Birth: Nov 27, 1950  Transition of Care [facility]) CM/SW Contact  Chapman Fitch, RN Phone Number: 12/01/2020, 4:43 PM  Clinical Narrative:     Notified that patient was in mittens.  Patient will have to be without for 24 hours.  MD and RN notified  Clinical sent to PASRR for level 2  Expected Discharge Plan: Skilled Nursing Facility Barriers to Discharge: Continued Medical Work up  Expected Discharge Plan and Services Expected Discharge Plan: Skilled Nursing Facility     Post Acute Care Choice: Skilled Nursing Facility Living arrangements for the past 2 months: Single Family Home                                       Social Determinants of Health (SDOH) Interventions    Readmission Risk Interventions No flowsheet data found.

## 2020-12-01 NOTE — TOC Progression Note (Signed)
Transition of Care [facility]) - Progression Note    Patient Details  Name: Julie Morrow MRN: [id number] Date of Birth: 1951/03/09  Transition of Care [facility] CM/SW Contact  Chapman Fitch, RN Phone Number: 12/01/2020, 10:58 AM  Clinical Narrative:     Clinical updated to [facility] Per MD patient clinically ready for discharge  Spoke with sister Lillia Abed.  She is in agreement for patient to return to [facility]  Auth started Per Kenney Houseman if auth obtainined patient can return today  Expected Discharge Plan: Skilled Nursing Facility Barriers to Discharge: Continued Medical Work up  Expected Discharge Plan and Services Expected Discharge Plan: Skilled Nursing Facility     Post Acute Care Choice: Skilled Nursing Facility Living arrangements for the past 2 months: Single Family Home                                       Social Determinants of Health (SDOH) Interventions    Readmission Risk Interventions No flowsheet data found.

## 2020-12-01 NOTE — Progress Notes (Signed)
PROGRESS NOTE    Julie Morrow  IRS:[id number] DOB: Feb 04, 1951 DOA: 11/25/2020 PCP: Patient, No Pcp Per (Inactive)   Chief complaint.  Coffee-ground emesis. Brief Narrative:  Julie Morrow is a 70 y.o. female with medical history significant for Diabetes, bipolar mood disorder, chronic encephalopathy/dementia class III obesity, hypothyroidism, brought in by EMS from the nursing home following an episode of coffee-ground emesis.  She was found covered in coffee-ground emesis and altered.  She was hypoxic to the 50s with EMS arrived on nonrebreather. EGD was performed on 7/12, showed a Mallory-Weiss tear.  She is placed Protonix gtt. and also developed significant aspiration pneumonia and acute hypoxemia.  She is treated with Unasyn.   Assessment & Plan:   Principal Problem:   Coffee ground emesis Active Problems:   Morbid (severe) obesity due to excess calories (HCC)   Acute kidney injury (HCC)   Bipolar disorder, mixed (HCC)   Diabetes (HCC)   Acute respiratory failure with hypoxia (HCC)   Aspiration pneumonia (HCC)   Hypokalemia  #1.  Acute upper GI bleed secondary to Mallory-Weiss tear. Mallory-Weiss tear. Condition improved.  Continue Protonix 40 mg twice a day.  #2.  Acute hypoxemic respiratory failure. Aspiration pneumonia. Dysphagia. Remains high risk for aspiration.  Currently has completed antibiotics.  Oxygenation is better.  Patient currently pending nursing placement.  3.  Dementia. Anxiety depression. Bipolar disorder. Patient still confused, occasionally agitated. Patient long term prognosis is poor, seen by palliative care.  However, patient sister still want her to be full code.  We will continue palliative care after discharge from hospital  DVT prophylaxis: SCDs Code Status: full Family Communication:  Disposition Plan:    Status is: Inpatient  Remains inpatient appropriate because:Unsafe d/c plan  Dispo: The patient is from: Home               Anticipated d/c is to: SNF              Patient currently is medically stable to d/c.   Difficult to place patient No        I/O last 3 completed shifts: In: 640 [P.O.:240; IV Piggyback:400] Out: 1490 [Urine:1490] No intake/output data recorded.     Consultants:  GI  Procedures: EGD  Antimicrobials: None   Subjective: Patient still confused with occasional agitation.  She also sleeping most the time at the daytime. She has a poor appetite, but no abdominal pain or nausea vomiting. She does not have any short of breath or cough. She does not have any dysuria hematuria pain No fever or chills.  Objective: Vitals:   12/01/20 0019 12/01/20 0020 12/01/20 0410 12/01/20 0817  BP: 108/69  114/62   Pulse: 82 80 75   Resp: 20  (!) 24   Temp: 99.5 F (37.5 C)  98.7 F (37.1 C)   TempSrc: Oral  Oral   SpO2: 92% 94% 96% 97%  Weight:      Height:        Intake/Output Summary (Last 24 hours) at 12/01/2020 1309 Last data filed at 12/01/2020 0093 Gross per 24 hour  Intake 520 ml  Output 1490 ml  Net -970 ml   Filed Weights   11/25/20 0151 11/25/20 0457  Weight: 105 kg 103.5 kg    Examination:  General exam: Ill-appearing,  Respiratory system: Clear to auscultation. Respiratory effort normal. Cardiovascular system: S1 & S2 heard, RRR. No JVD, murmurs, rubs, gallops or clicks. No pedal edema. Gastrointestinal system: Abdomen is nondistended, soft and  nontender. No organomegaly or masses felt. Normal bowel sounds heard. Central nervous system: Drowsy and confused. no focal neurological deficits. Extremities: Symmetric 5 x 5 power. Skin: No rashes, lesions or ulcers     Data Reviewed: I have personally reviewed following labs and imaging studies  CBC: Recent Labs  Lab 11/25/20 0150 11/25/20 0804 11/26/20 0621 11/27/20 0627 11/28/20 0737 11/29/20 0634 11/30/20 0443  WBC 11.4*  --  12.4* 11.7* 9.6 7.4 9.1  NEUTROABS 9.3*  --   --   --   --   --   --   HGB  15.8*   < > 12.5 11.3* 11.3* 10.9* 11.1*  HCT 49.5*   < > 38.0 36.5 34.9* 34.4* 34.0*  MCV 97.4  --  97.4 100.6* 96.4 96.4 95.5  PLT 234  --  165 156 160 166 165   < > = values in this interval not displayed.   Basic Metabolic Panel: Recent Labs  Lab 11/27/20 0627 11/28/20 0737 11/29/20 0634 11/30/20 0443 12/01/20 0535  NA 145 144 145 141 140  K 3.3* 3.1* 3.1* 3.3* 3.4*  CL 108 107 109 107 105  CO2 27 28 28 28 27   GLUCOSE 130* 142* 114* 101* 137*  BUN 20 13 12 12 15   CREATININE 0.77 0.66 0.57 0.65 0.72  CALCIUM 8.7* 8.9 8.7* 8.7* 8.9  MG 2.1 2.0 2.0 2.0 2.0   GFR: Estimated Creatinine Clearance: 76.3 mL/min (by C-G formula based on SCr of 0.72 mg/dL). Liver Function Tests: No results for input(s): AST, ALT, ALKPHOS, BILITOT, PROT, ALBUMIN in the last 168 hours. No results for input(s): LIPASE, AMYLASE in the last 168 hours. No results for input(s): AMMONIA in the last 168 hours. Coagulation Profile: Recent Labs  Lab 11/25/20 0150  INR 1.0   Cardiac Enzymes: No results for input(s): CKTOTAL, CKMB, CKMBINDEX, TROPONINI in the last 168 hours. BNP (last 3 results) No results for input(s): PROBNP in the last 8760 hours. HbA1C: No results for input(s): HGBA1C in the last 72 hours. CBG: No results for input(s): GLUCAP in the last 168 hours. Lipid Profile: No results for input(s): CHOL, HDL, LDLCALC, TRIG, CHOLHDL, LDLDIRECT in the last 72 hours. Thyroid Function Tests: No results for input(s): TSH, T4TOTAL, FREET4, T3FREE, THYROIDAB in the last 72 hours. Anemia Panel: No results for input(s): VITAMINB12, FOLATE, FERRITIN, TIBC, IRON, RETICCTPCT in the last 72 hours. Sepsis Labs: Recent Labs  Lab 11/25/20 0150 11/25/20 0804 11/26/20 0621 11/27/20 0627  PROCALCITON  --  3.08 6.33 2.87  LATICACIDVEN 1.6  --   --   --     Recent Results (from the past 240 hour(s))  Resp Panel by RT-PCR (Flu A&B, Covid) Nasopharyngeal Swab     Status: None   Collection Time:  11/25/20  2:47 AM   Specimen: Nasopharyngeal Swab; Nasopharyngeal(NP) swabs in vial transport medium  Result Value Ref Range Status   SARS Coronavirus 2 by RT PCR NEGATIVE NEGATIVE Final    Comment: (NOTE) SARS-CoV-2 target nucleic acids are NOT DETECTED.  The SARS-CoV-2 RNA is generally detectable in upper respiratory specimens during the acute phase of infection. The lowest concentration of SARS-CoV-2 viral copies this assay can detect is 138 copies/mL. A negative result does not preclude SARS-Cov-2 infection and should not be used as the sole basis for treatment or other patient management decisions. A negative result may occur with  improper specimen collection/handling, submission of specimen other than nasopharyngeal swab, presence of viral mutation(s) within the areas targeted by  this assay, and inadequate number of viral copies(<138 copies/mL). A negative result must be combined with clinical observations, patient history, and epidemiological information. The expected result is Negative.  Fact Sheet for Patients:  BloggerCourse.com  Fact Sheet for Healthcare Providers:  SeriousBroker.it  This test is no t yet approved or cleared by the [place] FDA and  has been authorized for detection and/or diagnosis of SARS-CoV-2 by FDA under an Emergency Use Authorization (EUA). This EUA will remain  in effect (meaning this test can be used) for the duration of the COVID-19 declaration under Section 564(b)(1) of the Act, 21 U.S.C.section 360bbb-3(b)(1), unless the authorization is terminated  or revoked sooner.       Influenza A by PCR NEGATIVE NEGATIVE Final   Influenza B by PCR NEGATIVE NEGATIVE Final    Comment: (NOTE) The Xpert Xpress SARS-CoV-2/FLU/RSV plus assay is intended as an aid in the diagnosis of influenza from Nasopharyngeal swab specimens and should not be used as a sole basis for treatment. Nasal washings  and aspirates are unacceptable for Xpert Xpress SARS-CoV-2/FLU/RSV testing.  Fact Sheet for Patients: BloggerCourse.com  Fact Sheet for Healthcare Providers: SeriousBroker.it  This test is not yet approved or cleared by the [place] FDA and has been authorized for detection and/or diagnosis of SARS-CoV-2 by FDA under an Emergency Use Authorization (EUA). This EUA will remain in effect (meaning this test can be used) for the duration of the COVID-19 declaration under Section 564(b)(1) of the Act, 21 U.S.C. section 360bbb-3(b)(1), unless the authorization is terminated or revoked.  Performed at [facility], [address] [place], [place] [address]   MRSA Next Gen by PCR, Nasal     Status: None   Collection Time: 11/25/20  7:26 AM   Specimen: Nasopharyngeal Swab; Nasal Swab  Result Value Ref Range Status   MRSA by PCR Next Gen NOT DETECTED NOT DETECTED Final    Comment: (NOTE) The GeneXpert MRSA Assay (FDA approved for NASAL specimens only), is one component of a comprehensive MRSA colonization surveillance program. It is not intended to diagnose MRSA infection nor to guide or monitor treatment for MRSA infections. Test performance is not FDA approved in patients less than 21 years old. Performed at [facility], [address] [place], [place] [address]          Radiology Studies: No results found.      Scheduled Meds:  aspirin  81 mg Oral Daily   benztropine  0.5 mg Oral BID   buPROPion  300 mg Oral Daily   divalproex  750 mg Oral QHS   feeding supplement (NEPRO CARB STEADY)  237 mL Oral BID BM   ipratropium-albuterol  3 mL Nebulization TID   levothyroxine  150 mcg Oral Q0600   liver oil-zinc oxide   Topical BID   melatonin  5 mg Oral QHS   multivitamin with minerals  1 tablet Oral Q1200   pantoprazole  40 mg Oral BID   risperiDONE  2 mg Oral QHS   sodium chloride flush  10-40 mL  Intracatheter Q12H   tamsulosin  0.4 mg Oral QPC supper   Continuous Infusions:   LOS: 6 days    Time spent: 28 minutes    Marrion Coy, MD [facility]   To contact the attending provider between 7A-7P or the covering provider during after hours 7P-7A, please log into the web site [email or web address] and access using universal [place] password for that web site. If you do not  have the password, please call the hospital operator.  12/01/2020, 1:09 PM

## 2020-12-01 NOTE — Care Management (Signed)
RE: Julie Morrow Date of Birth:03/12/1951 Date: 12/01/20   To Whom It May Concern:  Please be advised that the above-named patient will require a short-term nursing home stay - anticipated 30 days or less for rehabilitation and strengthening.  The plan is for return home.

## 2020-12-01 NOTE — Progress Notes (Signed)
Physical Therapy Treatment Patient Details Name: MARGRET MOAT MRN: [id number] DOB: [birth year] Today's Date: 12/01/2020    History of Present Illness Pt is a 70 y.o. female presenting to hospital 7/12 from [facility] care with coffee-ground emesis, episode of aspiration at nursing home, and hypoxic in 50's per EMS.  Pt admitted with coffee ground emesis/upper GI bleed, acute respiratory failure with hypoxia, and aspiration pneumonitis.  S/p upper GI endoscopy 7/12 (showed irritation of GE junction c/w a Mallory-Weiss tear that is healing).  PMH includes h/o bipolar 1 disorder, anxiety, DM, depression, UTI, PAD, insomnia, COPD.    PT Comments    Pt resting in bed upon PT arrival; pt only able to state name/DOB.  Pt inconsistent with following 1 step cues during session and requiring extra time and cueing for initiation of movement.  Max assist with bed mobility and sitting balance fluctuating between min to mod to max assist d/t posterior lean requiring vc's to correct and repositioning.  Will continue to focus on strengthening, balance, and progressive functional mobility per pt tolerance.    Follow Up Recommendations  SNF     Equipment Recommendations  Other (comment) (TBD at next facility)    Recommendations for Other Services       Precautions / Restrictions Precautions Precautions: Fall Restrictions Weight Bearing Restrictions: No    Mobility  Bed Mobility Overal bed mobility: Needs Assistance Bed Mobility: Supine to Sit;Sit to Supine Rolling: Max assist;Total assist (logrolling L and R in bed)   Supine to sit: Max assist;HOB elevated Sit to supine: Max assist;HOB elevated   General bed mobility comments: assist for trunk and B LE's (pt assisting some with LE's and trunk); vc's for technique; 2 assist to boost pt up in bed end of session    Transfers                 General transfer comment: Deferred d/t safety concerns (requiring min to mod to max assist  for sitting balance and pt with difficulty following 1 step cueing)  Ambulation/Gait             General Gait Details: not appropriate at this time   Stairs             Wheelchair Mobility    Modified Rankin (Stroke Patients Only)       Balance Overall balance assessment: Needs assistance Sitting-balance support: Bilateral upper extremity supported;Feet supported Sitting balance-Leahy Scale: Poor Sitting balance - Comments: pt fluctuating between min to mod to max assist for sitting balance; vc's and tactile cues to correct Postural control: Posterior lean                                  Cognition Arousal/Alertness: Awake/alert Behavior During Therapy: Flat affect Overall Cognitive Status: Impaired/Different from baseline Area of Impairment: Orientation;Attention;Memory;Following commands;Safety/judgement;Awareness;Problem solving                 Orientation Level: Disoriented to;Place;Time;Situation Current Attention Level: Selective Memory: Decreased short-term memory Following Commands: Follows one step commands inconsistently;Follows one step commands with increased time Safety/Judgement: Decreased awareness of deficits Awareness: Intellectual Problem Solving: Slow processing;Decreased initiation;Difficulty sequencing;Requires verbal cues;Requires tactile cues        Exercises      General Comments  Pt agreeable to PT session.      Pertinent Vitals/Pain Pain Assessment: Faces Faces Pain Scale: No hurt Pain Intervention(s): Limited activity within patient's tolerance;Monitored  during session;Repositioned Vitals (HR and O2 on 2 L O2 via nasal cannula) stable and WFL throughout treatment session.    Home Living                      Prior Function            PT Goals (current goals can now be found in the care plan section) Acute Rehab PT Goals Patient Stated Goal: pt did not state any goals PT Goal Formulation:  Patient unable to participate in goal setting Time For Goal Achievement: 12/14/20 Potential to Achieve Goals: Fair Progress towards PT goals: Progressing toward goals    Frequency    Min 2X/week      PT Plan Current plan remains appropriate    Co-evaluation              AM-PAC PT "6 Clicks" Mobility   Outcome Measure  Help needed turning from your back to your side while in a flat bed without using bedrails?: A Lot Help needed moving from lying on your back to sitting on the side of a flat bed without using bedrails?: A Lot Help needed moving to and from a bed to a chair (including a wheelchair)?: Total Help needed standing up from a chair using your arms (e.g., wheelchair or bedside chair)?: Total Help needed to walk in hospital room?: Total Help needed climbing 3-5 steps with a railing? : Total 6 Click Score: 8    End of Session Equipment Utilized During Treatment: Oxygen (2 L O2 via nasal cannula) Activity Tolerance: Patient limited by fatigue Patient left: in bed;with call bell/phone within reach;with bed alarm set Nurse Communication: Mobility status;Precautions PT Visit Diagnosis: Other abnormalities of gait and mobility (R26.89);Muscle weakness (generalized) (M62.81);Difficulty in walking, not elsewhere classified (R26.2)     Time: 1937-9024 PT Time Calculation (min) (ACUTE ONLY): 24 min  Charges:  $Therapeutic Activity: 23-37 mins                    Hendricks Limes, PT 12/01/20, 2:49 PM

## 2020-12-01 NOTE — Consult Note (Signed)
Consultation Note Date: 12/01/2020   Patient Name: Julie Morrow  DOB: [birth year]  MRN: [id number]  Age / Sex: 70 y.o., female  PCP: Patient, No Pcp Per (Inactive) Referring Physician: Marrion Coy, MD  Reason for Consultation: Establishing goals of care   Clinical Assessment and Goals of Care: Notes and diagnostics reviewed. Previous [facility] admission noted in May 2022, as well as admission to [facility] for UTI. She was deemed unsafe to live alone, and was discharged to a  SNF with overall failure to thrive, acute metabolic encephalopathy, and dysphagia. She was was admitted this hospitalization for aspiration PNA.     Upon entry, patient is resting in bed. Upon speaking to her, introducing myself she says "hi". With asking how she is doing, she opens her eyes and says "I'm doing okay." Upon trying to ask any further questions she closes her eyes and does not open them again, or speak further.  Attempted to reach [facility], but not able to leave a VM. Also attempted to reach other sister Inetta Fermo unsuccessfully. Plans for D/C to [facility]. Would recommend palliative to follow there for further GOC.      SUMMARY OF RECOMMENDATIONS   Recommend outpatient palliative to follow for GOC conversations.    Prognosis:  Poor overall       Primary Diagnoses: Present on Admission:  Morbid (severe) obesity due to excess calories (HCC)  Bipolar disorder, mixed (HCC)  Acute kidney injury (HCC)   I have reviewed the medical record, interviewed the patient and family, and examined the patient. The following aspects are pertinent.  Past Medical History:  Diagnosis Date   Anxiety    Bipolar 1 disorder (HCC)    Bradycardia    Depression    Diabetes mellitus, type II (HCC)    Patient takes Glucotrol and Januvia   Hypothyroidism    Lithium toxicity    Social History   Socioeconomic History   Marital  status: Single    Spouse name: Not on file   Number of children: Not on file   Years of education: Not on file   Highest education level: Not on file  Occupational History   Not on file  Tobacco Use   Smoking status: Every Day    Packs/day: 0.50    Years: 20.00    Pack years: 10.00    Types: Cigarettes    Start date: 11/06/1984   Smokeless tobacco: Never  Vaping Use   Vaping Use: Never used  Substance and Sexual Activity   Alcohol use: No    Alcohol/week: 0.0 standard drinks   Drug use: No   Sexual activity: Not Currently  Other Topics Concern   Not on file  Social History Narrative   Not on file   Social Determinants of Health   Financial Resource Strain: Not on file  Food Insecurity: Not on file  Transportation Needs: Not on file  Physical Activity: Not on file  Stress: Not on file  Social Connections: Not on file   Family History  Problem Relation Age of Onset   Dementia Mother    Arthritis Father    Scheduled Meds:  aspirin  81 mg Oral Daily   benztropine  0.5 mg Oral BID   buPROPion  300 mg Oral Daily   divalproex  750 mg Oral QHS   feeding supplement (NEPRO CARB STEADY)  237 mL Oral BID BM   ipratropium-albuterol  3 mL Nebulization TID   levothyroxine  150 mcg Oral Q0600   liver oil-zinc oxide   Topical BID   melatonin  5 mg Oral QHS   multivitamin with minerals  1 tablet Oral Q1200   pantoprazole  40 mg Oral BID   risperiDONE  2 mg Oral QHS   sodium chloride flush  10-40 mL Intracatheter Q12H   tamsulosin  0.4 mg Oral QPC supper   Continuous Infusions:  potassium chloride 10 mEq (12/01/20 1038)   PRN Meds:.ondansetron **OR** ondansetron (ZOFRAN) IV, sodium chloride flush Medications Prior to Admission:  Prior to Admission medications   Medication Sig Start Date End Date Taking? Authorizing Provider  aspirin 81 MG chewable tablet Chew 1 tablet (81 mg total) by mouth daily. 01/05/19  Yes Clapacs, Jackquline Denmark, MD  benztropine (COGENTIN) 0.5 MG tablet Take  1 tablet (0.5 mg total) by mouth 2 (two) times daily. 11/06/20  Yes Leatha Gilding, MD  buPROPion (WELLBUTRIN XL) 150 MG 24 hr tablet Take 2 tablets (300 mg total) by mouth daily. 11/06/20  Yes Gherghe, Daylene Katayama, MD  CINNAMON PO Take 1 tablet by mouth daily.   Yes [provider]  divalproex (DEPAKOTE ER) 250 MG 24 hr tablet Take 750 mg by mouth at bedtime. 10/01/19  Yes [provider]  fenofibrate 160 MG tablet Take 1 tablet (160 mg total) by mouth daily with breakfast. 01/05/19  Yes Clapacs, Jackquline Denmark, MD  FEROSUL 325 (65 Fe) MG tablet Take 325 mg by mouth daily. 10/01/19  Yes [provider]  levothyroxine (SYNTHROID) 150 MCG tablet Take 1 tablet (150 mcg total) by mouth daily. 11/07/20  Yes Leatha Gilding, MD  losartan (COZAAR) 25 MG tablet Take 25 mg by mouth daily. 08/26/20  Yes [provider]  melatonin 5 MG TABS Take 5-10 mg by mouth at bedtime.   Yes [provider]  Multiple Vitamins-Minerals (CENTRUM) tablet Take 1 tablet by mouth daily.   Yes [provider]  Probiotic Product (PROBIOTIC PO) Take 1 capsule by mouth daily.   Yes [provider]  risperiDONE (RISPERDAL) 2 MG tablet Take 1 tablet (2 mg total) by mouth at bedtime. 11/06/20  Yes Gherghe, Daylene Katayama, MD  rosuvastatin (CRESTOR) 40 MG tablet Take 40 mg by mouth daily. 12/31/18  Yes [provider]  RYBELSUS 14 MG TABS Take 1 tablet by mouth every morning. 08/26/20  Yes [provider]  polyvinyl alcohol (LIQUIFILM TEARS) 1.4 % ophthalmic solution Place 1 drop into both eyes as needed for dry eyes.    [provider]   Allergies  Allergen Reactions   Neomycin    Review of Systems  All other systems reviewed and are negative.  Physical Exam Constitutional:      Comments: Opened eyes and spoke, quickly closing her eyes again.   Pulmonary:     Effort: Pulmonary effort is normal.  Skin:    General: Skin is warm and dry.    Vital Signs:  BP 114/62 (BP Location: Right Arm)   Pulse 75   Temp 98.7 F (37.1 C) (  Oral)   Resp (!) 24   Ht 5\' 3"  (1.6 m)   Wt 103.5 kg   SpO2 97%   BMI 40.42 kg/m  Pain Scale: 0-10 POSS *See Group Information*: 1-Acceptable,Awake and alert Pain Score: 0-No pain   SpO2: SpO2: 97 % O2 Device:SpO2: 97 % O2 Flow Rate: .O2 Flow Rate (L/min): 2 L/min  IO: Intake/output summary:  Intake/Output Summary (Last 24 hours) at 12/01/2020 1110 Last data filed at 12/01/2020 12/03/2020 Gross per 24 hour  Intake 520 ml  Output 1490 ml  Net -970 ml    LBM: Last BM Date: 11/27/20 Baseline Weight: Weight: 105 kg Most recent weight: Weight: 103.5 kg        Time In: 9:30 Time Out: 10:00 Time Total: 30 min Greater than 50%  of this time was spent counseling and coordinating care related to the above assessment and plan.  Signed by: 11/29/20, NP   Please contact Palliative Medicine Team phone at [phone] for questions and concerns.  For individual provider: See [phone]

## 2020-12-01 NOTE — Progress Notes (Signed)
Chaplain made brief initial visit with patient at bedside. Patient was alert at the beginning of our time together, but quickly dozed off. Chaplain offered silent prayer at bedside and is available for continued support as needed.

## 2020-12-01 NOTE — Progress Notes (Signed)
Patient removed from mitten restraints.

## 2020-12-01 NOTE — Care Management Important Message (Addendum)
Important Message  Patient Details  Name: Julie Morrow MRN: [id number] Date of Birth: 14-Nov-1950   Medicare Important Message Given:  Yes  Reviewed Medicare IM with [name], sister at [phone].  Copy of Medicare IM mailed to sister's attention at: [address] [place], [place] [address].  Copy of Medicare IM left in patient's room in packet guide folder for reference.      [name] 12/01/2020, 12:42 PM

## 2020-12-02 DIAGNOSIS — N179 Acute kidney failure, unspecified: Secondary | ICD-10-CM

## 2020-12-02 LAB — BASIC METABOLIC PANEL
Anion gap: 10 (ref 5–15)
BUN: 14 mg/dL (ref 8–23)
CO2: 28 mmol/L (ref 22–32)
Calcium: 8.9 mg/dL (ref 8.9–10.3)
Chloride: 103 mmol/L (ref 98–111)
Creatinine, Ser: 0.7 mg/dL (ref 0.44–1.00)
GFR, Estimated: >60 mL/min (ref >60)
Glucose, Bld: 145 mg/dL — ABNORMAL HIGH (ref 70–99)
Potassium: 3.4 mmol/L — ABNORMAL LOW (ref 3.5–5.1)
Sodium: 141 mmol/L (ref 135–145)

## 2020-12-02 LAB — MAGNESIUM: Magnesium: 1.9 mg/dL (ref 1.7–2.4)

## 2020-12-02 LAB — PREPARE RBC (CROSSMATCH)

## 2020-12-02 MED ORDER — NEPRO/CARBSTEADY PO LIQD: 237.0000 mL | Freq: Two times a day (BID) | ORAL | 0 refills | Status: DC

## 2020-12-02 MED ORDER — POTASSIUM CHLORIDE CRYS ER 20 MEQ PO TBCR: 20.0000 meq | EXTENDED_RELEASE_TABLET | Freq: Two times a day (BID) | ORAL | 0 refills | Status: DC

## 2020-12-02 MED ORDER — POTASSIUM CHLORIDE 10 MEQ/100ML IV SOLN
10.0000 meq | INTRAVENOUS | Status: AC
  Administered 2020-12-02 (×4): 10 meq via INTRAVENOUS
  Filled 2020-12-02: qty 100

## 2020-12-02 MED ORDER — TAMSULOSIN HCL 0.4 MG PO CAPS: 0.4000 mg | ORAL_CAPSULE | Freq: Every day | ORAL | 0 refills | Status: DC

## 2020-12-02 MED ORDER — PANTOPRAZOLE SODIUM 40 MG PO TBEC: 40.0000 mg | DELAYED_RELEASE_TABLET | Freq: Two times a day (BID) | ORAL | 0 refills | Status: AC

## 2020-12-02 NOTE — Discharge Summary (Signed)
Physician Discharge Summary  Patient ID: Julie Morrow MRN: [id number] DOB/AGE: 70-Sep-1952 70 y.o.  Admit date: 11/25/2020 Discharge date: 12/02/2020  Admission Diagnoses:  Discharge Diagnoses:  Principal Problem:   Coffee ground emesis Active Problems:   Morbid (severe) obesity due to excess calories (HCC)   Acute kidney injury (HCC)   Bipolar disorder, mixed (HCC)   Diabetes (HCC)   Acute respiratory failure with hypoxia (HCC)   Aspiration pneumonia (HCC)   Hypokalemia   Discharged Condition: poor  Hospital Course:  Julie Morrow is a 70 y.o. female with medical history significant for Diabetes, bipolar mood disorder, chronic encephalopathy/dementia class III obesity, hypothyroidism, brought in by EMS from the nursing home following an episode of coffee-ground emesis.  She was found covered in coffee-ground emesis and altered.  She was hypoxic to the 50s with EMS arrived on nonrebreather. EGD was performed on 7/12, showed a Mallory-Weiss tear.  She is placed Protonix gtt. and also developed significant aspiration pneumonia and acute hypoxemia.  She is treated with Unasyn.  #1.  Acute upper GI bleed secondary to Mallory-Weiss tear. Mallory-Weiss tear. Condition improved.  Continue Protonix 40 mg twice a day.  Follow-up with GI in 3 weeks.  #2.  Acute hypoxemic respiratory failure. Aspiration pneumonia. Dysphagia. Remains high risk for aspiration.  Currently has completed antibiotics.  Oxygenation is better.   Patient has a poor prognosis due to aspiration and the dementia.  She has been evaluated by palliative care.  Please continue palliative care in long-term care.  3.  Dementia. Anxiety depression. Bipolar disorder. Patient still confused, occasionally agitated. Patient long term prognosis is poor, seen by palliative care.  However, patient sister still want her to be full code.  We will continue palliative care after discharge from hospital  Had a peer to peer review  with the patient insurance, patient is denied for SNF placement.  She will be transferred back to long-term care.  Consults: GI  Significant Diagnostic Studies:   Treatments: Antibiotics, PPI  Discharge Exam: Blood pressure 111/80, pulse 78, temperature 98.9 F (37.2 C), temperature source Oral, resp. rate 18, height 5\' 3"  (1.6 m), weight 103.5 kg, SpO2 93 %. General appearance: alert, cooperative, and confused Resp: clear to auscultation bilaterally Cardio: regular rate and rhythm, S1, S2 normal, no murmur, click, rub or gallop GI: soft, non-tender; bowel sounds normal; no masses,  no organomegaly Extremities: extremities normal, atraumatic, no cyanosis or edema  Disposition: Discharge disposition: 70-Long Term Care       Discharge Instructions     Diet - low sodium heart healthy   Complete by: As directed    Discharge wound care:   Complete by: As directed    Follow with nurse   Increase activity slowly   Complete by: As directed       Allergies as of 12/02/2020       Reactions   Neomycin         Medication List     STOP taking these medications    CINNAMON PO   fenofibrate 160 MG tablet       TAKE these medications    aspirin 81 MG chewable tablet Chew 1 tablet (81 mg total) by mouth daily.   benztropine 0.5 MG tablet Commonly known as: COGENTIN Take 1 tablet (0.5 mg total) by mouth 2 (two) times daily.   buPROPion 150 MG 24 hr tablet Commonly known as: WELLBUTRIN XL Take 2 tablets (300 mg total) by mouth daily.   Centrum  tablet Take 1 tablet by mouth daily.   divalproex 250 MG 24 hr tablet Commonly known as: DEPAKOTE ER Take 750 mg by mouth at bedtime.   feeding supplement (NEPRO CARB STEADY) Liqd Take 237 mLs by mouth 2 (two) times daily between meals.   FeroSul 325 (65 FE) MG tablet Generic drug: ferrous sulfate Take 325 mg by mouth daily.   levothyroxine 150 MCG tablet Commonly known as: SYNTHROID Take 1 tablet (150 mcg total)  by mouth daily.   losartan 25 MG tablet Commonly known as: COZAAR Take 25 mg by mouth daily.   melatonin 5 MG Tabs Take 5-10 mg by mouth at bedtime.   pantoprazole 40 MG tablet Commonly known as: PROTONIX Take 1 tablet (40 mg total) by mouth 2 (two) times daily.   polyvinyl alcohol 1.4 % ophthalmic solution Commonly known as: LIQUIFILM TEARS Place 1 drop into both eyes as needed for dry eyes.   potassium chloride SA 20 MEQ tablet Commonly known as: KLOR-CON Take 1 tablet (20 mEq total) by mouth 2 (two) times daily for 14 days.   PROBIOTIC PO Take 1 capsule by mouth daily.   risperiDONE 2 MG tablet Commonly known as: RISPERDAL Take 1 tablet (2 mg total) by mouth at bedtime.   rosuvastatin 40 MG tablet Commonly known as: CRESTOR Take 40 mg by mouth daily.   Rybelsus 14 MG Tabs Generic drug: Semaglutide Take 1 tablet by mouth every morning.   tamsulosin 0.4 MG Caps capsule Commonly known as: FLOMAX Take 1 capsule (0.4 mg total) by mouth daily after supper for 7 days.               Discharge Care Instructions  (From admission, onward)           Start     Ordered   12/02/20 0000  Discharge wound care:       Comments: Follow with nurse   12/02/20 1014            Follow-up Information     Wyline Mood, MD Follow up in 3 week(s).   Specialty: Gastroenterology Contact information: [address] [place] [address] [phone]                34 minutes Signed: Marrion Coy 12/02/2020, 10:14 AM

## 2020-12-02 NOTE — Plan of Care (Signed)
  Problem: Clinical Measurements: Goal: Ability to maintain clinical measurements within normal limits will improve Outcome: Progressing Goal: Will remain free from infection Outcome: Progressing Goal: Diagnostic test results will improve Outcome: Progressing Goal: Respiratory complications will improve Outcome: Progressing Goal: Cardiovascular complication will be avoided Outcome: Progressing   Problem: Pain Managment: Goal: General experience of comfort will improve Outcome: Progressing   Problem: Safety: Goal: Ability to remain free from injury will improve Outcome: Progressing   Problem: Skin Integrity: Goal: Risk for impaired skin integrity will decrease Outcome: Progressing   Pt is alert and oriented to person only. V/S stable. Bladder scanned pt with 441 uo; in&out cath done. Desitin applied on her buttocks.

## 2020-12-02 NOTE — TOC Transition Note (Signed)
Transition of Care [facility]) - CM/SW Discharge Note   Patient Details  Name: Julie Morrow MRN: [id number] Date of Birth: [birth year]  [facility]) CM/SW Contact:  Maree Krabbe, LCSW Phone Number: 12/02/2020, 3:20 PM   Clinical Narrative:  Clinical Social Worker facilitated patient discharge including contacting patient family and facility to confirm patient discharge plans.  Clinical information faxed to facility and family agreeable with plan.  CSW arranged ambulance transport via [facility] to [facility] (room 24A) .  RN to call [phone] for report prior to discharge.     Final next level of care: Skilled Nursing Facility Barriers to Discharge: No Barriers Identified   Patient Goals and CMS Choice   CMS Medicare.gov Compare Post Acute Care list provided to:: Patient Represenative (must comment) (sister [name]) Choice offered to / list presented to : Sibling  Discharge Placement              Patient chooses bed at:  [facility]) Patient to be transferred to facility by: [facility]   Patient and family notified of of transfer: 12/02/20  Discharge Plan and Services     Post Acute Care Choice: Skilled Nursing Facility                               Social Determinants of Health (SDOH) Interventions     Readmission Risk Interventions No flowsheet data found.

## 2020-12-06 DIAGNOSIS — R918 Other nonspecific abnormal finding of lung field: Secondary | ICD-10-CM | POA: Diagnosis not present

## 2020-12-08 ENCOUNTER — Encounter: Payer: Self-pay | Admitting: Pulmonary Disease

## 2020-12-08 ENCOUNTER — Emergency Department: Payer: Medicare Other

## 2020-12-08 ENCOUNTER — Inpatient Hospital Stay
Admission: EM | Admit: 2020-12-08 | Discharge: 2020-12-16 | DRG: 871 | Disposition: A | Discharge status: Hospice | Payer: Medicare Other | Attending: Internal Medicine | Admitting: Internal Medicine

## 2020-12-08 ENCOUNTER — Inpatient Hospital Stay: Payer: Medicare Other

## 2020-12-08 ENCOUNTER — Other Ambulatory Visit: Payer: Self-pay

## 2020-12-08 DIAGNOSIS — R627 Adult failure to thrive: Secondary | ICD-10-CM | POA: Diagnosis present

## 2020-12-08 DIAGNOSIS — R6520 Severe sepsis without septic shock: Secondary | ICD-10-CM | POA: Diagnosis not present

## 2020-12-08 DIAGNOSIS — R6889 Other general symptoms and signs: Secondary | ICD-10-CM | POA: Diagnosis not present

## 2020-12-08 DIAGNOSIS — E876 Hypokalemia: Secondary | ICD-10-CM | POA: Diagnosis not present

## 2020-12-08 DIAGNOSIS — G4700 Insomnia, unspecified: Secondary | ICD-10-CM | POA: Diagnosis not present

## 2020-12-08 DIAGNOSIS — J90 Pleural effusion, not elsewhere classified: Secondary | ICD-10-CM | POA: Diagnosis not present

## 2020-12-08 DIAGNOSIS — B9620 Unspecified Escherichia coli [E. coli] as the cause of diseases classified elsewhere: Secondary | ICD-10-CM | POA: Diagnosis present

## 2020-12-08 DIAGNOSIS — Z515 Encounter for palliative care: Secondary | ICD-10-CM | POA: Diagnosis not present

## 2020-12-08 DIAGNOSIS — N390 Urinary tract infection, site not specified: Secondary | ICD-10-CM | POA: Diagnosis present

## 2020-12-08 DIAGNOSIS — E1165 Type 2 diabetes mellitus with hyperglycemia: Secondary | ICD-10-CM | POA: Diagnosis not present

## 2020-12-08 DIAGNOSIS — A419 Sepsis, unspecified organism: Principal | ICD-10-CM | POA: Diagnosis present

## 2020-12-08 DIAGNOSIS — L89156 Pressure-induced deep tissue damage of sacral region: Secondary | ICD-10-CM | POA: Diagnosis not present

## 2020-12-08 DIAGNOSIS — R6521 Severe sepsis with septic shock: Secondary | ICD-10-CM | POA: Diagnosis present

## 2020-12-08 DIAGNOSIS — F17210 Nicotine dependence, cigarettes, uncomplicated: Secondary | ICD-10-CM | POA: Diagnosis present

## 2020-12-08 DIAGNOSIS — Y95 Nosocomial condition: Secondary | ICD-10-CM | POA: Diagnosis present

## 2020-12-08 DIAGNOSIS — Z452 Encounter for adjustment and management of vascular access device: Secondary | ICD-10-CM | POA: Diagnosis not present

## 2020-12-08 DIAGNOSIS — E870 Hyperosmolality and hypernatremia: Secondary | ICD-10-CM | POA: Diagnosis present

## 2020-12-08 DIAGNOSIS — F419 Anxiety disorder, unspecified: Secondary | ICD-10-CM | POA: Diagnosis present

## 2020-12-08 DIAGNOSIS — J15211 Pneumonia due to Methicillin susceptible Staphylococcus aureus: Secondary | ICD-10-CM | POA: Diagnosis present

## 2020-12-08 DIAGNOSIS — K219 Gastro-esophageal reflux disease without esophagitis: Secondary | ICD-10-CM | POA: Diagnosis present

## 2020-12-08 DIAGNOSIS — Z66 Do not resuscitate: Secondary | ICD-10-CM | POA: Diagnosis not present

## 2020-12-08 DIAGNOSIS — Z881 Allergy status to other antibiotic agents status: Secondary | ICD-10-CM

## 2020-12-08 DIAGNOSIS — Z20822 Contact with and (suspected) exposure to covid-19: Secondary | ICD-10-CM | POA: Diagnosis present

## 2020-12-08 DIAGNOSIS — E039 Hypothyroidism, unspecified: Secondary | ICD-10-CM | POA: Diagnosis not present

## 2020-12-08 DIAGNOSIS — Z4682 Encounter for fitting and adjustment of non-vascular catheter: Secondary | ICD-10-CM | POA: Diagnosis not present

## 2020-12-08 DIAGNOSIS — E785 Hyperlipidemia, unspecified: Secondary | ICD-10-CM | POA: Diagnosis present

## 2020-12-08 DIAGNOSIS — R509 Fever, unspecified: Secondary | ICD-10-CM | POA: Diagnosis not present

## 2020-12-08 DIAGNOSIS — Z7189 Other specified counseling: Secondary | ICD-10-CM | POA: Diagnosis not present

## 2020-12-08 DIAGNOSIS — R000 Tachycardia, unspecified: Secondary | ICD-10-CM | POA: Diagnosis not present

## 2020-12-08 DIAGNOSIS — J811 Chronic pulmonary edema: Secondary | ICD-10-CM | POA: Diagnosis not present

## 2020-12-08 DIAGNOSIS — R404 Transient alteration of awareness: Secondary | ICD-10-CM | POA: Diagnosis not present

## 2020-12-08 DIAGNOSIS — G9341 Metabolic encephalopathy: Secondary | ICD-10-CM | POA: Diagnosis not present

## 2020-12-08 DIAGNOSIS — R0602 Shortness of breath: Secondary | ICD-10-CM | POA: Diagnosis not present

## 2020-12-08 DIAGNOSIS — Z90710 Acquired absence of both cervix and uterus: Secondary | ICD-10-CM

## 2020-12-08 DIAGNOSIS — L89322 Pressure ulcer of left buttock, stage 2: Secondary | ICD-10-CM | POA: Diagnosis present

## 2020-12-08 DIAGNOSIS — N179 Acute kidney failure, unspecified: Secondary | ICD-10-CM | POA: Diagnosis present

## 2020-12-08 DIAGNOSIS — F0390 Unspecified dementia without behavioral disturbance: Secondary | ICD-10-CM | POA: Diagnosis present

## 2020-12-08 DIAGNOSIS — Z7982 Long term (current) use of aspirin: Secondary | ICD-10-CM

## 2020-12-08 DIAGNOSIS — J440 Chronic obstructive pulmonary disease with acute lower respiratory infection: Secondary | ICD-10-CM | POA: Diagnosis not present

## 2020-12-08 DIAGNOSIS — L8990 Pressure ulcer of unspecified site, unspecified stage: Secondary | ICD-10-CM | POA: Insufficient documentation

## 2020-12-08 DIAGNOSIS — J9600 Acute respiratory failure, unspecified whether with hypoxia or hypercapnia: Secondary | ICD-10-CM | POA: Diagnosis present

## 2020-12-08 DIAGNOSIS — Z79890 Hormone replacement therapy (postmenopausal): Secondary | ICD-10-CM

## 2020-12-08 DIAGNOSIS — J9690 Respiratory failure, unspecified, unspecified whether with hypoxia or hypercapnia: Secondary | ICD-10-CM | POA: Diagnosis not present

## 2020-12-08 DIAGNOSIS — F319 Bipolar disorder, unspecified: Secondary | ICD-10-CM | POA: Diagnosis present

## 2020-12-08 DIAGNOSIS — E038 Other specified hypothyroidism: Secondary | ICD-10-CM | POA: Diagnosis not present

## 2020-12-08 DIAGNOSIS — Z79899 Other long term (current) drug therapy: Secondary | ICD-10-CM

## 2020-12-08 DIAGNOSIS — I4891 Unspecified atrial fibrillation: Secondary | ICD-10-CM | POA: Diagnosis present

## 2020-12-08 DIAGNOSIS — J9601 Acute respiratory failure with hypoxia: Secondary | ICD-10-CM | POA: Diagnosis present

## 2020-12-08 DIAGNOSIS — E035 Myxedema coma: Secondary | ICD-10-CM | POA: Diagnosis present

## 2020-12-08 DIAGNOSIS — R0689 Other abnormalities of breathing: Secondary | ICD-10-CM | POA: Diagnosis not present

## 2020-12-08 DIAGNOSIS — Z743 Need for continuous supervision: Secondary | ICD-10-CM | POA: Diagnosis not present

## 2020-12-08 DIAGNOSIS — Z6841 Body Mass Index (BMI) 40.0 and over, adult: Secondary | ICD-10-CM

## 2020-12-08 DIAGNOSIS — I517 Cardiomegaly: Secondary | ICD-10-CM | POA: Diagnosis not present

## 2020-12-08 DIAGNOSIS — Z9049 Acquired absence of other specified parts of digestive tract: Secondary | ICD-10-CM

## 2020-12-08 LAB — CBC WITH DIFFERENTIAL/PLATELET
Abs Immature Granulocytes: 0.09 10*3/uL — ABNORMAL HIGH (ref 0.00–0.07)
Basophils Absolute: 0.1 10*3/uL (ref 0.0–0.1)
Basophils Relative: 0 %
Eosinophils Absolute: 0.1 10*3/uL (ref 0.0–0.5)
Eosinophils Relative: 1 %
HCT: 40.5 % (ref 36.0–46.0)
Hemoglobin: 13.1 g/dL (ref 12.0–15.0)
Immature Granulocytes: 1 %
Lymphocytes Relative: 20 %
Lymphs Abs: 3.4 10*3/uL (ref 0.7–4.0)
MCH: 31.9 pg (ref 26.0–34.0)
MCHC: 32.3 g/dL (ref 30.0–36.0)
MCV: 98.5 fL (ref 80.0–100.0)
Monocytes Absolute: 1.7 10*3/uL — ABNORMAL HIGH (ref 0.1–1.0)
Monocytes Relative: 10 %
Neutro Abs: 11.4 10*3/uL — ABNORMAL HIGH (ref 1.7–7.7)
Neutrophils Relative %: 68 %
Platelets: 360 10*3/uL (ref 150–400)
RBC: 4.11 MIL/uL (ref 3.87–5.11)
RDW: 16 % — ABNORMAL HIGH (ref 11.5–15.5)
WBC: 16.8 10*3/uL — ABNORMAL HIGH (ref 4.0–10.5)
nRBC: 0 % (ref 0.0–0.2)

## 2020-12-08 LAB — BLOOD GAS, VENOUS
Acid-Base Excess: 2.2 mmol/L — ABNORMAL HIGH (ref 0.0–2.0)
Bicarbonate: 25.3 mmol/L (ref 20.0–28.0)
O2 Saturation: 87.9 %
Patient temperature: 37
pCO2, Ven: 34 mmHg — ABNORMAL LOW (ref 44.0–60.0)
pH, Ven: 7.48 — ABNORMAL HIGH (ref 7.250–7.430)
pO2, Ven: 50 mmHg — ABNORMAL HIGH (ref 32.0–45.0)

## 2020-12-08 LAB — URINALYSIS, COMPLETE (UACMP) WITH MICROSCOPIC
Bilirubin Urine: NEGATIVE
Glucose, UA: NEGATIVE mg/dL
Hgb urine dipstick: NEGATIVE
Ketones, ur: NEGATIVE mg/dL
Nitrite: POSITIVE — AB
Protein, ur: NEGATIVE mg/dL
Specific Gravity, Urine: 1.017 (ref 1.005–1.030)
WBC, UA: >50 WBC/hpf — ABNORMAL HIGH (ref 0–5)
pH: 5 (ref 5.0–8.0)

## 2020-12-08 LAB — PROTIME-INR
INR: 1.2 (ref 0.8–1.2)
Prothrombin Time: 14.8 seconds (ref 11.4–15.2)

## 2020-12-08 LAB — CBG MONITORING, ED: Glucose-Capillary: 192 mg/dL — ABNORMAL HIGH (ref 70–99)

## 2020-12-08 LAB — COMPREHENSIVE METABOLIC PANEL
ALT: 33 U/L (ref 0–44)
AST: 43 U/L — ABNORMAL HIGH (ref 15–41)
Albumin: 2.8 g/dL — ABNORMAL LOW (ref 3.5–5.0)
Alkaline Phosphatase: 77 U/L (ref 38–126)
Anion gap: 10 (ref 5–15)
BUN: 24 mg/dL — ABNORMAL HIGH (ref 8–23)
CO2: 24 mmol/L (ref 22–32)
Calcium: 8.9 mg/dL (ref 8.9–10.3)
Chloride: 112 mmol/L — ABNORMAL HIGH (ref 98–111)
Creatinine, Ser: 1.23 mg/dL — ABNORMAL HIGH (ref 0.44–1.00)
GFR, Estimated: 48 mL/min — ABNORMAL LOW (ref >60)
Glucose, Bld: 177 mg/dL — ABNORMAL HIGH (ref 70–99)
Potassium: 4.1 mmol/L (ref 3.5–5.1)
Sodium: 146 mmol/L — ABNORMAL HIGH (ref 135–145)
Total Bilirubin: 0.8 mg/dL (ref 0.3–1.2)
Total Protein: 6.4 g/dL — ABNORMAL LOW (ref 6.5–8.1)

## 2020-12-08 LAB — BLOOD GAS, ARTERIAL
Acid-base deficit: 1.8 mmol/L (ref 0.0–2.0)
Bicarbonate: 21.7 mmol/L (ref 20.0–28.0)
FIO2: 1
MECHVT: 450 mL
O2 Saturation: 99.8 %
PEEP: 5 cmH2O
Patient temperature: 37
RATE: 20 resp/min
pCO2 arterial: 32 mmHg (ref 32.0–48.0)
pH, Arterial: 7.44 (ref 7.350–7.450)
pO2, Arterial: 224 mmHg — ABNORMAL HIGH (ref 83.0–108.0)

## 2020-12-08 LAB — TROPONIN I (HIGH SENSITIVITY)
Troponin I (High Sensitivity): 17 ng/L (ref <18)
Troponin I (High Sensitivity): 14 ng/L (ref <18)

## 2020-12-08 LAB — STREP PNEUMONIAE URINARY ANTIGEN: Strep Pneumo Urinary Antigen: NEGATIVE

## 2020-12-08 LAB — MRSA NEXT GEN BY PCR, NASAL: MRSA by PCR Next Gen: NOT DETECTED

## 2020-12-08 LAB — GLUCOSE, CAPILLARY
Glucose-Capillary: 175 mg/dL — ABNORMAL HIGH (ref 70–99)
Glucose-Capillary: 207 mg/dL — ABNORMAL HIGH (ref 70–99)
Glucose-Capillary: 181 mg/dL — ABNORMAL HIGH (ref 70–99)
Glucose-Capillary: 173 mg/dL — ABNORMAL HIGH (ref 70–99)

## 2020-12-08 LAB — LACTIC ACID, PLASMA
Lactic Acid, Venous: 2.2 mmol/L (ref 0.5–1.9)
Lactic Acid, Venous: 1.7 mmol/L (ref 0.5–1.9)

## 2020-12-08 LAB — RESP PANEL BY RT-PCR (FLU A&B, COVID) ARPGX2
Influenza A by PCR: NEGATIVE
Influenza B by PCR: NEGATIVE
SARS Coronavirus 2 by RT PCR: NEGATIVE

## 2020-12-08 LAB — PROCALCITONIN: Procalcitonin: 0.18 ng/mL

## 2020-12-08 LAB — BRAIN NATRIURETIC PEPTIDE: B Natriuretic Peptide: 48.8 pg/mL (ref 0.0–100.0)

## 2020-12-08 LAB — APTT: aPTT: 34 seconds (ref 24–36)

## 2020-12-08 MED ORDER — HEPARIN (PORCINE) 25000 UT/250ML-% IV SOLN
1500.0000 [IU]/h | INTRAVENOUS | Status: DC
  Administered 2020-12-08: 1200 [IU]/h via INTRAVENOUS
  Administered 2020-12-09: 1300 [IU]/h via INTRAVENOUS
  Administered 2020-12-10: 1500 [IU]/h via INTRAVENOUS
  Filled 2020-12-08 (×3): qty 250

## 2020-12-08 MED ORDER — INSULIN ASPART 100 UNIT/ML IJ SOLN
0.0000 [IU] | INTRAMUSCULAR | Status: DC
  Administered 2020-12-08: 3 [IU] via SUBCUTANEOUS
  Administered 2020-12-08: 5 [IU] via SUBCUTANEOUS
  Administered 2020-12-08 – 2020-12-09 (×3): 3 [IU] via SUBCUTANEOUS
  Administered 2020-12-09 (×2): 5 [IU] via SUBCUTANEOUS
  Administered 2020-12-09 (×3): 3 [IU] via SUBCUTANEOUS
  Administered 2020-12-10: 2 [IU] via SUBCUTANEOUS
  Administered 2020-12-10: 5 [IU] via SUBCUTANEOUS
  Administered 2020-12-10: 8 [IU] via SUBCUTANEOUS
  Administered 2020-12-10: 3 [IU] via SUBCUTANEOUS
  Administered 2020-12-10: 5 [IU] via SUBCUTANEOUS
  Administered 2020-12-11 (×4): 2 [IU] via SUBCUTANEOUS
  Administered 2020-12-11: 3 [IU] via SUBCUTANEOUS
  Administered 2020-12-12: 5 [IU] via SUBCUTANEOUS
  Administered 2020-12-12: 3 [IU] via SUBCUTANEOUS
  Administered 2020-12-12 – 2020-12-13 (×3): 2 [IU] via SUBCUTANEOUS
  Administered 2020-12-13: 3 [IU] via SUBCUTANEOUS
  Administered 2020-12-13 (×2): 2 [IU] via SUBCUTANEOUS
  Administered 2020-12-14: 3 [IU] via SUBCUTANEOUS
  Administered 2020-12-14: 2 [IU] via SUBCUTANEOUS
  Administered 2020-12-14: 0 [IU] via SUBCUTANEOUS
  Administered 2020-12-14 – 2020-12-15 (×3): 2 [IU] via SUBCUTANEOUS
  Filled 2020-12-08 (×32): qty 1

## 2020-12-08 MED ORDER — POLYETHYLENE GLYCOL 3350 17 G PO PACK
17.0000 g | PACK | Freq: Every day | ORAL | Status: DC
  Administered 2020-12-09 – 2020-12-11 (×3): 17 g
  Filled 2020-12-08 (×3): qty 1

## 2020-12-08 MED ORDER — NOREPINEPHRINE 4 MG/250ML-% IV SOLN
0.0000 ug/min | INTRAVENOUS | Status: DC
  Administered 2020-12-08: 11 ug/min via INTRAVENOUS
  Administered 2020-12-08: 9 ug/min via INTRAVENOUS
  Administered 2020-12-08: 2 ug/min via INTRAVENOUS
  Filled 2020-12-08 (×3): qty 250

## 2020-12-08 MED ORDER — ENOXAPARIN SODIUM 60 MG/0.6ML IJ SOSY
0.5000 mg/kg | PREFILLED_SYRINGE | INTRAMUSCULAR | Status: DC
  Administered 2020-12-08: 52.5 mg via SUBCUTANEOUS
  Filled 2020-12-08 (×2): qty 0.53

## 2020-12-08 MED ORDER — PANTOPRAZOLE SODIUM 40 MG IV SOLR
40.0000 mg | Freq: Every day | INTRAVENOUS | Status: DC
  Administered 2020-12-08 – 2020-12-14 (×7): 40 mg via INTRAVENOUS
  Filled 2020-12-08 (×7): qty 40

## 2020-12-08 MED ORDER — SODIUM CHLORIDE 0.9 % IV SOLN
250.0000 mL | INTRAVENOUS | Status: DC
  Administered 2020-12-08 – 2020-12-14 (×4): 250 mL via INTRAVENOUS

## 2020-12-08 MED ORDER — FENTANYL BOLUS VIA INFUSION
25.0000 ug | INTRAVENOUS | Status: DC | PRN
  Filled 2020-12-08: qty 100

## 2020-12-08 MED ORDER — MIDAZOLAM HCL 2 MG/2ML IJ SOLN
2.0000 mg | Freq: Once | INTRAMUSCULAR | Status: AC
  Administered 2020-12-08: 2 mg via INTRAVENOUS
  Filled 2020-12-08: qty 2

## 2020-12-08 MED ORDER — VANCOMYCIN HCL 500 MG/100ML IV SOLN
500.0000 mg | Freq: Once | INTRAVENOUS | Status: AC
  Administered 2020-12-08: 500 mg via INTRAVENOUS
  Filled 2020-12-08: qty 100

## 2020-12-08 MED ORDER — POLYETHYLENE GLYCOL 3350 17 G PO PACK: 17.0000 g | PACK | Freq: Every day | ORAL | Status: DC | PRN

## 2020-12-08 MED ORDER — LACTATED RINGERS IV BOLUS
1000.0000 mL | Freq: Once | INTRAVENOUS | Status: AC
  Administered 2020-12-08: 1000 mL via INTRAVENOUS

## 2020-12-08 MED ORDER — LACTATED RINGERS IV BOLUS (SEPSIS)
1000.0000 mL | Freq: Once | INTRAVENOUS | Status: AC
  Administered 2020-12-08: 1000 mL via INTRAVENOUS

## 2020-12-08 MED ORDER — MIDAZOLAM HCL 2 MG/2ML IJ SOLN
1.0000 mg | INTRAMUSCULAR | Status: DC | PRN
Start: 2020-12-08 — End: 2020-12-09

## 2020-12-08 MED ORDER — NOREPINEPHRINE 4 MG/250ML-% IV SOLN
INTRAVENOUS | Status: AC
  Filled 2020-12-08: qty 250

## 2020-12-08 MED ORDER — DEXMEDETOMIDINE HCL IN NACL 400 MCG/100ML IV SOLN
0.0000 ug/kg/h | INTRAVENOUS | Status: DC
  Administered 2020-12-08: 0.4 ug/kg/h via INTRAVENOUS
  Administered 2020-12-08: 0.8 ug/kg/h via INTRAVENOUS
  Administered 2020-12-08: 0.4 ug/kg/h via INTRAVENOUS
  Filled 2020-12-08 (×4): qty 100

## 2020-12-08 MED ORDER — DOCUSATE SODIUM 50 MG/5ML PO LIQD
100.0000 mg | Freq: Two times a day (BID) | ORAL | Status: DC
Start: 2020-12-08 — End: 2020-12-11
  Administered 2020-12-09 – 2020-12-11 (×4): 100 mg
  Filled 2020-12-08 (×5): qty 10

## 2020-12-08 MED ORDER — ACETAMINOPHEN 650 MG RE SUPP
650.0000 mg | RECTAL | Status: DC | PRN
  Administered 2020-12-08 (×2): 650 mg via RECTAL
  Filled 2020-12-08 (×2): qty 1

## 2020-12-08 MED ORDER — ALBUMIN HUMAN 5 % IV SOLN
25.0000 g | Freq: Once | INTRAVENOUS | Status: AC
  Administered 2020-12-08: 25 g via INTRAVENOUS
  Filled 2020-12-08: qty 500

## 2020-12-08 MED ORDER — SODIUM CHLORIDE 0.9 % IV SOLN
2.0000 g | Freq: Once | INTRAVENOUS | Status: AC
  Administered 2020-12-08: 2 g via INTRAVENOUS
  Filled 2020-12-08: qty 2

## 2020-12-08 MED ORDER — LACTATED RINGERS IV BOLUS (SEPSIS)
500.0000 mL | Freq: Once | INTRAVENOUS | Status: AC
  Administered 2020-12-08: 500 mL via INTRAVENOUS

## 2020-12-08 MED ORDER — ORAL CARE MOUTH RINSE
15.0000 mL | OROMUCOSAL | Status: DC
  Administered 2020-12-08 – 2020-12-10 (×22): 15 mL via OROMUCOSAL

## 2020-12-08 MED ORDER — CHLORHEXIDINE GLUCONATE CLOTH 2 % EX PADS
6.0000 | MEDICATED_PAD | Freq: Every day | CUTANEOUS | Status: DC
  Administered 2020-12-08 – 2020-12-13 (×7): 6 via TOPICAL

## 2020-12-08 MED ORDER — MIDAZOLAM HCL 2 MG/2ML IJ SOLN: 1.0000 mg | INTRAMUSCULAR | Status: DC | PRN

## 2020-12-08 MED ORDER — VANCOMYCIN HCL 750 MG/150ML IV SOLN
750.0000 mg | INTRAVENOUS | Status: DC
  Administered 2020-12-09: 750 mg via INTRAVENOUS
  Filled 2020-12-08: qty 150

## 2020-12-08 MED ORDER — SODIUM CHLORIDE 0.9 % IV SOLN
2.0000 g | Freq: Two times a day (BID) | INTRAVENOUS | Status: DC
  Administered 2020-12-08 – 2020-12-09 (×2): 2 g via INTRAVENOUS
  Filled 2020-12-08 (×3): qty 2

## 2020-12-08 MED ORDER — ACETAMINOPHEN 325 MG PO TABS
650.0000 mg | ORAL_TABLET | ORAL | Status: DC | PRN
  Administered 2020-12-09: 650 mg via ORAL
  Filled 2020-12-08: qty 2

## 2020-12-08 MED ORDER — FENTANYL 2500MCG IN NS 250ML (10MCG/ML) PREMIX INFUSION
25.0000 ug/h | INTRAVENOUS | Status: DC
  Administered 2020-12-08: 25 ug/h via INTRAVENOUS
  Administered 2020-12-09 – 2020-12-10 (×3): 200 ug/h via INTRAVENOUS
  Filled 2020-12-08 (×4): qty 250

## 2020-12-08 MED ORDER — DOCUSATE SODIUM 100 MG PO CAPS: 100.0000 mg | ORAL_CAPSULE | Freq: Two times a day (BID) | ORAL | Status: DC | PRN

## 2020-12-08 MED ORDER — CHLORHEXIDINE GLUCONATE 0.12% ORAL RINSE (MEDLINE KIT)
15.0000 mL | Freq: Two times a day (BID) | OROMUCOSAL | Status: DC
  Administered 2020-12-08 – 2020-12-10 (×5): 15 mL via OROMUCOSAL

## 2020-12-08 MED ORDER — MIDODRINE HCL 5 MG PO TABS
10.0000 mg | ORAL_TABLET | Freq: Three times a day (TID) | ORAL | Status: DC
  Administered 2020-12-08 – 2020-12-11 (×8): 10 mg
  Filled 2020-12-08 (×8): qty 2

## 2020-12-08 MED ORDER — VANCOMYCIN HCL IN DEXTROSE 1-5 GM/200ML-% IV SOLN: 1000.0000 mg | Freq: Once | INTRAVENOUS | Status: DC

## 2020-12-08 MED ORDER — FENTANYL CITRATE (PF) 100 MCG/2ML IJ SOLN: 25.0000 ug | Freq: Once | INTRAMUSCULAR | Status: DC

## 2020-12-08 MED ORDER — LEVOTHYROXINE SODIUM 50 MCG PO TABS
150.0000 ug | ORAL_TABLET | Freq: Every day | ORAL | Status: DC
  Administered 2020-12-08 – 2020-12-09 (×2): 150 ug
  Filled 2020-12-08 (×2): qty 1

## 2020-12-08 MED ORDER — VANCOMYCIN HCL 2000 MG/400ML IV SOLN
2000.0000 mg | Freq: Once | INTRAVENOUS | Status: AC
  Administered 2020-12-08: 2000 mg via INTRAVENOUS
  Filled 2020-12-08: qty 400

## 2020-12-08 MED ORDER — HEPARIN BOLUS VIA INFUSION
5000.0000 [IU] | Freq: Once | INTRAVENOUS | Status: AC
  Administered 2020-12-08: 5000 [IU] via INTRAVENOUS
  Filled 2020-12-08: qty 5000

## 2020-12-08 NOTE — Progress Notes (Signed)
Lolita Patella, NP secure chat and let him know that patient's temperature is 102.6 unrelieved by tylenol and that Ice packs have been applied within the last 30 minutes. NP stated that ibuprofen couldn't be used but that cooling blanket can be placed if needed.

## 2020-12-08 NOTE — Progress Notes (Signed)
Patient in from outside facility with respiratory distress. Patient initially was placed on HFNC per Dr Larinda Buttery. Decision made to intubate patient. Patient tolerated interventions well. 7.5 et tube 24 at lip. Patient placed on vent. Settings verified with Dr Larinda Buttery.

## 2020-12-08 NOTE — Consult Note (Signed)
ANTICOAGULATION CONSULT NOTE - Follow Up Consult  Pharmacy Consult for Heparin gtt Indication: atrial fibrillation  No Known Allergies  Patient Measurements: Height: 5\' 3"  (160 cm) (from previous admission data) Weight: 101.9 kg (224 lb 10.4 oz) IBW/kg (Calculated) : 52.4 Heparin Dosing Weight: 76.4kg  Vital Signs: Temp: 102.38 F (39.1 C) (07/25 1745) Temp Source: Esophageal (07/25 1600) BP: 105/47 (07/25 1745) Pulse Rate: 110 (07/25 1730)  Labs: Recent Labs    12/08/20 0547 12/08/20 0800  HGB 13.1  --   HCT 40.5  --   PLT 360  --   APTT 34  --   LABPROT 14.8  --   INR 1.2  --   CREATININE 1.23*  --   TROPONINIHS 17 14    Estimated Creatinine Clearance: 49.2 mL/min (A) (by C-G formula based on SCr of 1.23 mg/dL (H)).   Medications:  ASA 81 PTA; no AC. NKDA Heparin Dosing Weight: 76.4kg  Assessment: [name] w/ h/o bipolar disorder, diabetes, dementia who presents to the ED for w/u of AMS was admitted with Septic shock, Acute Hypoxic Respiratory Failure, and Acute Metabolic Encephalopathy in the setting of UTI and questionable HCAP requiring intubation in the ED and admission to ICU. Pharmacy consulted for mgmt of heparin gtt for Afib.  Goal of Therapy:  Heparin level 0.3-0.7 units/ml Monitor platelets by anticoagulation protocol: Yes   Plan:  Date Time aPTT/HL Rate/Comment 7/25 1326 ---   Dosed lovenox 0.5mg /kg      Baseline Labs: aPTT - 34s INR - 1.2 Hgb - 13.1 Plts - 360   Plan: Normal baseline labs, no PTA AC and only ppx LMWH received this admission.   7/25: Give 5000 units bolus x 1; then start heparin infusion at 1200 units/hr Check anti-Xa level in 6 hours (7/26 0030) and daily while on heparin Continue to monitor H&H and platelets  08-06-2004, PharmD, BCCP 12/08/2020,5:55 PM

## 2020-12-08 NOTE — H&P (Addendum)
NAME:  Julie Morrow, MRN:  [id number], DOB:  03/06/1951, LOS: 0 ADMISSION DATE:  12/08/2020, CONSULTATION DATE:  12/08/2020 REFERRING MD:  Dr. Charna Archer, CHIEF COMPLAINT:  Altered Mental status and Respiratory Distress  Brief Pt Description / Synopsis:  [name] admitted with Septic shock, Acute Hypoxic Respiratory Failure, and Acute Metabolic Encephalopathy in the setting of UTI and questionable Healthcare Associated Pneumonia requiring intubation in the ED.  History of Present Illness:  Julie Morrow is a [name] with a past medical history as listed below who presented to [facility] ED on 12/08/20 from [facility] due to altered mental status and acute respiratory distress.  Pt is currently intubated and sedated, and no family currently is available, therefore history is obtained from ED and nursing notes.  Per notes, EMS was dispatched due to altered mental status and concern for worsening pneumonia.  She was reportedly started on Levaquin yesterday due to concern for RLL Pneumonia, but has continued to decline since then.  EMS found the patient somnolent, febrile with temperature 103.1, tachypnea, and hypoxic with O2 sats in the lows 80's on room air.  ED Course: Upon presentation to the ED, she remained somnolent and in respiratory distress with significant rales and rhonchi noted on exam.  Decision was made to emergently intubate. Initial Vital Signs: Temperature 103.2, RR 42, Pulse 97, BP 80/48, O2 sats 91% on 65% HFNC Imaging:  Chest X-ray>> mild pulmonary vascular congestion with left basilar opacity (atelectasis vs. PNA) and small pleural effusion Abdomina X-ray>>The enteric tube tip and side port are well below the level of the GE junction and projects over the expected location of the distal stomach. Bowel gas pattern is unremarkable. Labs: Na 146, glucose 177, BUN 24, Creatinine 1.23,AST 43, ALT 33, BNP 48.8, HS Troponin 17 ~ 14, lactic acid 2.2 ~ 1.7, Procalcitonin  0.18, WBC 16.8 w/ Neutrophilia Urinalysis + UTI SARS-CoV-2 is negative  EKG Interpretation Date: 12/08/2020 EKG Time: 05:42 Rate: 103 Rhythm: sinus tachycardia QRS Axis:  Normal Intervals:  none ST/T Wave abnormalities: none  Narrative Interpretation: Sinus Tachycardia without acute ischemic changes  She was hypotensive and also met sepsis criteria (fever 103, HR >90, RR >20, WBC 16.8), therefore she 3.5 liter of IV fluid resuscitation along with broad spectrum antibiotics including IV Cefepime and Vancomycin.  Despite IV fluid resuscitation she remained hypotension, requiring initiation of Levophed infusion.  PCCM is consulted for further workup and treatment of Septic shock, Acute Hypoxic Respiratory Failure, and Acute Metabolic Encephalopathy in the setting of UTI and questionable Healthcare Associated Pneumonia.  Pertinent  Medical History  Dementia Bipolar Disorder Depression Anxiety Diabetes Mellitus Hypothyroidism  Micro Data:  12/08/20: SARS-CoV-2 & Influenza PCR>>negative 12/08/20: Blood culture x2>> 12/08/20: Urine>> 12/08/20: Tracheal aspirate>> 12/08/20: Strep pneumo urinary antigen>> 12/08/20: Legionella urinary antigen>>  Antimicrobials:  Cefepime 7/25>> Vancomycin 7/25>>  Significant Hospital Events: Including procedures, antibiotic start and stop dates in addition to other pertinent events   12/08/20: presented to ED with AMS and hypoxia, intubated in ED, PCCM consulted for admit  Interim History / Subjective:  -Required intubation in ED -Hypotensive despite IVF resuscitation, now requiring vasopressors -Critically ill  Objective   Blood pressure (!) 78/42, pulse 79, temperature (!) 103.2 F (39.6 C), resp. rate (!) 21, SpO2 100 %.    Vent Mode: AC FiO2 (%):  [65 %-100 %] 100 % Set Rate:  [20 bmp] 20 bmp Vt Set:  [450 mL] 450 mL PEEP:  [5 cmH20] 5 cmH20  Intake/Output Summary (Last 24 hours) at 12/08/2020 0733 Last data filed at 12/08/2020 0725 Gross  per 24 hour  Intake 1450 ml  Output --  Net 1450 ml   There were no vitals filed for this visit.  Examination: General: Critically ill appearing female, laying in bed, intubated and sedated, in NAD HENT: Atraumatic, normocephalic, neck supple, no JVD Lungs: Coarse rhonchi throughout, vent assisted, overbreathing the vent, even Cardiovascular: Regular rate and rhythm, s1s2, no M/R/G Abdomen: Obese, soft, nontender, nondistended, no guarding or rebound tenderness, BS+ x4 Extremities: No deformities, trace edema BLE Neuro: Sedated, withdraws from pain, does not follow commands, pupils PERRLA (brisk 2 mm bilaterally) GU: Foley catheter in place Skin: Deep tissue injury to sacrum (present on admission) and moisture associated damage to perineal area  Resolved Hospital Problem list     Assessment & Plan:   Acute Hypoxic Respiratory Failure secondary to suspected HCAP -Full vent support, implement lung protective strategies -Wean FiO2 & PEEP as tolerated to maintain O2 sats >92% -Follow intermittent Chest X-ray & ABG as needed -Spontaneous Breathing Trials when respiratory parameters met and mental status permits -Implement VAP Bundle -Prn Bronchodilators -ABX as above  Septic Shock -Continuous cardiac monitoring -Maintain MAP >65 -Received IV fluid resuscitation in ED -Vasopressors as needed to maintain MAP goal -Start Midodrine -Check CVP and further volume resuscitate as needed -Trend lactic acid until normalized (2.2 ~ 1.7) -HS Troponin negative x2 (17 ~ 14) -Consider Echocardiogram  Severe Sepsis in the setting of UTI and questionable HCAP -Monitor fever curve -Trend WBC's & Procalcitonin -Follow cultures as above -Continue empiric Cefepime & Vancomycin pending cultures & sensitivities  Acute Kidney Injury Mild Hypernatremia -Monitor I&O's / urinary output -Follow BMP -Ensure adequate renal perfusion -Avoid nephrotoxic agents as able -Replace electrolytes as  indicated -Pharmacy following for assistance with electrolyte replacement -IV Fluids  Diabetes Mellitus PMHx of Hypothyroidism -CBG's -SSI -Follow ICU Hypo/Hyperglycemia protocol -Check Hgb A1c -Continue home Synthroid -Check TSH and free T4  Acute Metabolic Encephalopathy in the setting of severe sepsis Sedation needs in setting of mechanical ventilation PMHx of Bipolar disorder, Anxiety, Depression -Maintain a RASS goal of 0 to -1 -Precedex to maintain RASS goal -Avoid sedating medications as able -Daily wake up assessment -Provide supportive care  Deep Tissue Injury to sacrum & Stage II Pressure injury to Left Ischium (present on admission) -Wound care consulted, appreciate input -Turn q2h   Best Practice (right click and "Reselect all SmartList Selections" daily)   Diet/type: NPO DVT prophylaxis: LMWH GI prophylaxis: PPI Lines: N/A Foley:  N/A Code Status:  full code Last date of multidisciplinary goals of care discussion [12/08/2020]  Labs   CBC: Recent Labs  Lab 12/08/20 0547  WBC 16.8*  NEUTROABS 11.4*  HGB 13.1  HCT 40.5  MCV 98.5  PLT 237    Basic Metabolic Panel: Recent Labs  Lab 12/02/20 0740 12/08/20 0547  NA 141 146*  K 3.4* 4.1  CL 103 112*  CO2 28 24  GLUCOSE 145* 177*  BUN 14 24*  CREATININE 0.70 1.23*  CALCIUM 8.9 8.9  MG 1.9  --    GFR: Estimated Creatinine Clearance: 49.6 mL/min (A) (by C-G formula based on SCr of 1.23 mg/dL (H)). Recent Labs  Lab 12/08/20 0547  WBC 16.8*  LATICACIDVEN 2.2*    Liver Function Tests: Recent Labs  Lab 12/08/20 0547  AST 43*  ALT 33  ALKPHOS 77  BILITOT 0.8  PROT 6.4*  ALBUMIN 2.8*   No results for  input(s): LIPASE, AMYLASE in the last 168 hours. No results for input(s): AMMONIA in the last 168 hours.  ABG    Component Value Date/Time   PHART 7.435 10/24/2020 1026   PCO2ART 41.7 10/24/2020 1026   PO2ART 64.3 (L) 10/24/2020 1026   HCO3 25.3 12/08/2020 0544   TCO2 28 10/24/2018  0933   O2SAT 87.9 12/08/2020 0544     Coagulation Profile: Recent Labs  Lab 12/08/20 0547  INR 1.2    Cardiac Enzymes: No results for input(s): CKTOTAL, CKMB, CKMBINDEX, TROPONINI in the last 168 hours.  HbA1C: Hemoglobin A1C  Date/Time Value Ref Range Status  06/16/2011 12:47 PM 6.2 4.2 - 6.3 % Final    Comment:    The American Diabetes Association recommends that a primary goal of therapy should be <7% and that physicians should reevaluate the treatment regimen in patients with HbA1c values consistently >8%.    Hgb A1c MFr Bld  Date/Time Value Ref Range Status  09/16/2020 04:21 PM 6.5 (H) 4.8 - 5.6 % Final    Comment:    (NOTE) Pre diabetes:          5.7%-6.4%  Diabetes:              >6.4%  Glycemic control for   <7.0% adults with diabetes   12/28/2018 07:04 AM 6.1 (H) 4.8 - 5.6 % Final    Comment:    (NOTE) Pre diabetes:          5.7%-6.4% Diabetes:              >6.4% Glycemic control for   <7.0% adults with diabetes     CBG: No results for input(s): GLUCAP in the last 168 hours.  Review of Systems:   Unable to assess due to intubation, sedation, and critical illness  Past Medical History:  She,  has a past medical history of Anxiety, Bipolar 1 disorder ([place]), Bradycardia, Depression, Diabetes mellitus, type II ([place]), Hypothyroidism, and Lithium toxicity.   Surgical History:   Past Surgical History:  Procedure Laterality Date   ABDOMINAL HYSTERECTOMY     CHOLECYSTECTOMY     ESOPHAGOGASTRODUODENOSCOPY (EGD) WITH PROPOFOL N/A 11/25/2020   Procedure: ESOPHAGOGASTRODUODENOSCOPY (EGD) WITH PROPOFOL;  Surgeon: Lucilla Lame, MD;  Location: [facility] ENDOSCOPY;  Service: Endoscopy;  Laterality: N/A;     Social History:   reports that she has been smoking cigarettes. She started smoking about 36 years ago. She has a 10.00 pack-year smoking history. She has never used smokeless tobacco. She reports that she does not drink alcohol and does not use drugs.   Family  History:  Her family history includes Arthritis in her father; Dementia in her mother.   Allergies Allergies  Allergen Reactions   Neomycin      Home Medications  Prior to Admission medications   Medication Sig Start Date End Date Taking? Authorizing Provider  aspirin 81 MG chewable tablet Chew 1 tablet (81 mg total) by mouth daily. 01/05/19   Clapacs, Madie Reno, MD  benztropine (COGENTIN) 0.5 MG tablet Take 1 tablet (0.5 mg total) by mouth 2 (two) times daily. 11/06/20   Caren Griffins, MD  buPROPion (WELLBUTRIN XL) 150 MG 24 hr tablet Take 2 tablets (300 mg total) by mouth daily. 11/06/20   Caren Griffins, MD  divalproex (DEPAKOTE ER) 250 MG 24 hr tablet Take 750 mg by mouth at bedtime. 10/01/19   [provider]  FEROSUL 325 (65 Fe) MG tablet Take 325 mg by mouth daily. 10/01/19  [provider]  levothyroxine (SYNTHROID) 150 MCG tablet Take 1 tablet (150 mcg total) by mouth daily. 11/07/20   Caren Griffins, MD  losartan (COZAAR) 25 MG tablet Take 25 mg by mouth daily. 08/26/20   [provider]  melatonin 5 MG TABS Take 5-10 mg by mouth at bedtime.    [provider]  Multiple Vitamins-Minerals (CENTRUM) tablet Take 1 tablet by mouth daily.    [provider]  Nutritional Supplements (FEEDING SUPPLEMENT, NEPRO CARB STEADY,) LIQD Take 237 mLs by mouth 2 (two) times daily between meals. 12/02/20   Sharen Hones, MD  pantoprazole (PROTONIX) 40 MG tablet Take 1 tablet (40 mg total) by mouth 2 (two) times daily. 12/02/20   Sharen Hones, MD  polyvinyl alcohol (LIQUIFILM TEARS) 1.4 % ophthalmic solution Place 1 drop into both eyes as needed for dry eyes.    [provider]  potassium chloride SA (KLOR-CON) 20 MEQ tablet Take 1 tablet (20 mEq total) by mouth 2 (two) times daily for 14 days. 12/02/20 12/16/20  Sharen Hones, MD  Probiotic Product (PROBIOTIC PO) Take 1 capsule by mouth daily.    [provider]  risperiDONE (RISPERDAL) 2 MG  tablet Take 1 tablet (2 mg total) by mouth at bedtime. 11/06/20   Caren Griffins, MD  rosuvastatin (CRESTOR) 40 MG tablet Take 40 mg by mouth daily. 12/31/18   [provider]  RYBELSUS 14 MG TABS Take 1 tablet by mouth every morning. 08/26/20   [provider]  tamsulosin (FLOMAX) 0.4 MG CAPS capsule Take 1 capsule (0.4 mg total) by mouth daily after supper for 7 days. 12/02/20 12/09/20  Sharen Hones, MD     Critical care time: 55 minutes     Darel Hong, AGACNP-BC [facility] Prefer epic messenger for cross cover needs If after hours, please call E-link

## 2020-12-08 NOTE — Procedures (Signed)
Central Venous Catheter Insertion Procedure Note  KALSEY LULL  [id number]  07/05/1950  Date:12/08/20  Time:11:03 AM   Provider Performing:[place] [name] D Elvina Sidle   Procedure: Insertion of Non-tunneled Central Venous Catheter(36556) with US guidance (11021)   Indication(s) Medication administration and Difficult access  Consent Unable to obtain consent due to emergent nature of procedure.  Anesthesia Topical only with 1% lidocaine   Timeout Verified patient identification, verified procedure, site/side was marked, verified correct patient position, special equipment/implants available, medications/allergies/relevant history reviewed, required imaging and test results available.  Sterile Technique Maximal sterile technique including full sterile barrier drape, hand hygiene, sterile gown, sterile gloves, mask, hair covering, sterile ultrasound probe cover (if used).  Procedure Description Area of catheter insertion was cleaned with chlorhexidine and draped in sterile fashion.  With real-time ultrasound guidance a central venous catheter was placed into the left internal jugular vein. Nonpulsatile blood flow and easy flushing noted in all ports.  The catheter was sutured in place and sterile dressing applied.  Complications/Tolerance None; patient tolerated the procedure well. Chest X-ray is ordered to verify placement for internal jugular or subclavian cannulation.   Chest x-ray is not ordered for femoral cannulation.  EBL Minimal  Specimen(s) None   Line secured at the 20 cm mark.  BIOPATCH applied to the insertion site.     Harlon Ditty, AGACNP-BC [facility] Prefer epic messenger for cross cover needs If after hours, please call E-link

## 2020-12-08 NOTE — Progress Notes (Signed)
CODE SEPSIS - PHARMACY COMMUNICATION  **Broad Spectrum Antibiotics should be administered within 1 hour of Sepsis diagnosis**  Time Code Sepsis Called/Page Received: 7/25 @ 0543  Antibiotics Ordered:  Vanc , Cefepime   Time of 1st antibiotic administration:  Vancomycin 500 mg IV X 1 given on 7/25 @ [phone]   Additional action taken by pharmacy:   If necessary, Name of Provider/Nurse Contacted:     Vernita Tague D ,PharmD Clinical Pharmacist  12/08/2020  6:52 AM

## 2020-12-08 NOTE — Consult Note (Signed)
WOC Nurse Consult Note: Reason for Consult:Deep tissue injury to sacrum and left sacral area with partial thickness tissue loss Left ischium, stage 2 pressure injury Moisture associated skin damage to perineum Wound type: moisture and pressure Pressure Injury POA: NO Measurement: Left ischium: 0.5 cm x 0.5 cm x0.1 cm Sacrum: 4 cm x 4 cm intact maroon nonblanchable tissue Left upper buttocks: 2 cm x 4 cm nonblanchable erythema Scattered MASD To perineal skin   Wound bed:red and moist Drainage (amount, consistency, odor) minimal serosanguinous  no odor.  Periwound:frequently moist Dressing procedure/placement/frequency: Cleanse sacral and ischial wounds with soap andwater and pat dry. Apply silicone foam dressings.  Change every three days and PRn soilage.  Barrier ointment to perineal skin.  No disposable briefs or underpads  Turn and reposition every two hours.   If transferred to the medical floor, patent will need low air loss mattress.  Will not follow at this time.  Please re-consult if needed.  Maple Hudson MSN, RN, FNP-BC CWON Wound, Ostomy, Continence Nurse Pager [phone]

## 2020-12-08 NOTE — ED Provider Notes (Signed)
[facility] Emergency Department Provider Note   ____________________________________________   Event Date/Time   First MD Initiated Contact with Patient 12/08/20 [phone]     (approximate)  I have reviewed the triage vital signs and the nursing notes.   HISTORY  Chief Complaint Altered Mental Status and Shortness of Breath    HPI Arther DamesSusan B Latimore is a 70 y.o. female with past medical history of bipolar disorder, diabetes, dementia who presents to the ED for altered mental status.  History is limited due to patient's altered mental status, she is currently somnolent and unable to provide any history.  Per EMS, they were called to [facility] due to concern for worsening pneumonia.  Patient was reportedly started on Levaquin yesterday due to concern for right lower lobe pneumonia, has continued to decline since then.  EMS found patient somnolent and tachypneic with O2 sats in the low 80s on room air.  She was placed on 4 L nasal cannula with improvement, but continued to be significantly tachypneic.  She was also noted to be febrile with axillary temp of 103.1.        Past Medical History:  Diagnosis Date   Anxiety    Bipolar 1 disorder (HCC)    Bradycardia    Depression    Diabetes mellitus, type II (HCC)    Patient takes Glucotrol and Januvia   Hypothyroidism    Lithium toxicity     Patient Active Problem List   Diagnosis Date Noted   Acute respiratory failure (HCC) 12/08/2020   Hypokalemia 11/28/2020   Coffee ground emesis 11/25/2020   Acute respiratory failure with hypoxia (HCC) 11/25/2020   Aspiration pneumonia (HCC) 11/25/2020   FTT (failure to thrive) in adult 10/08/2020   Depression 10/07/2020   Thyroid disease 10/07/2020   Hyperlipidemia 10/07/2020   Failure to thrive in adult 10/07/2020   Acute encephalopathy 10/07/2020   Major neurocognitive disorder (HCC) 09/27/2020   Diabetes (HCC) 09/23/2020   Bipolar disorder, mixed (HCC)  09/19/2020   Adjustment disorder with anxiety 09/13/2020   Family discord 12/05/2019   AMS (altered mental status) 05/14/2019   Acute lower UTI 05/14/2019   Adjustment disorder with mixed disturbance of emotions and conduct    Bipolar I disorder with mania (HCC) 12/27/2018   PAD (peripheral artery disease) (HCC) 10/16/2018   GAD (generalized anxiety disorder) 03/04/2018   Insomnia 03/04/2018   Situational depression 03/04/2018   Bipolar depression (HCC) 03/04/2018   Acute kidney injury (HCC) 05/30/2017   Bradycardia    Lithium toxicity    COPD GOLD 0 / still smoking  12/22/2016   Generalized weakness 10/27/2015   GERD (gastroesophageal reflux disease) 10/15/2015   Bipolar 1 disorder, mixed, severe (HCC) 10/11/2015   Diabetes type 2, controlled (HCC) 07/07/2015   Morbid (severe) obesity due to excess calories (HCC) 07/07/2015   Cigarette smoker 07/07/2015   Hypothyroidism 07/07/2015   HLD (hyperlipidemia) 11/07/2014   Psoriasis 11/07/2014    Past Surgical History:  Procedure Laterality Date   ABDOMINAL HYSTERECTOMY     CHOLECYSTECTOMY     ESOPHAGOGASTRODUODENOSCOPY (EGD) WITH PROPOFOL N/A 11/25/2020   Procedure: ESOPHAGOGASTRODUODENOSCOPY (EGD) WITH PROPOFOL;  Surgeon: Midge MiniumWohl, Darren, MD;  [facility];  Service: Endoscopy;  Laterality: N/A;    Prior to Admission medications   Medication Sig Start Date End Date Taking? Authorizing Provider  aspirin 81 MG chewable tablet Chew 1 tablet (81 mg total) by mouth daily. 01/05/19   Clapacs, Jackquline DenmarkJohn T, MD  benztropine (COGENTIN) 0.5  MG tablet Take 1 tablet (0.5 mg total) by mouth 2 (two) times daily. 11/06/20   Leatha Gilding, MD  buPROPion (WELLBUTRIN XL) 150 MG 24 hr tablet Take 2 tablets (300 mg total) by mouth daily. 11/06/20   Leatha Gilding, MD  divalproex (DEPAKOTE ER) 250 MG 24 hr tablet Take 750 mg by mouth at bedtime. 10/01/19   [provider]  FEROSUL 325 (65 Fe) MG tablet Take 325 mg by mouth daily.  10/01/19   [provider]  levothyroxine (SYNTHROID) 150 MCG tablet Take 1 tablet (150 mcg total) by mouth daily. 11/07/20   Leatha Gilding, MD  losartan (COZAAR) 25 MG tablet Take 25 mg by mouth daily. 08/26/20   [provider]  melatonin 5 MG TABS Take 5-10 mg by mouth at bedtime.    [provider]  Multiple Vitamins-Minerals (CENTRUM) tablet Take 1 tablet by mouth daily.    [provider]  Nutritional Supplements (FEEDING SUPPLEMENT, NEPRO CARB STEADY,) LIQD Take 237 mLs by mouth 2 (two) times daily between meals. 12/02/20   Marrion Coy, MD  pantoprazole (PROTONIX) 40 MG tablet Take 1 tablet (40 mg total) by mouth 2 (two) times daily. 12/02/20   Marrion Coy, MD  polyvinyl alcohol (LIQUIFILM TEARS) 1.4 % ophthalmic solution Place 1 drop into both eyes as needed for dry eyes.    [provider]  potassium chloride SA (KLOR-CON) 20 MEQ tablet Take 1 tablet (20 mEq total) by mouth 2 (two) times daily for 14 days. 12/02/20 12/16/20  Marrion Coy, MD  Probiotic Product (PROBIOTIC PO) Take 1 capsule by mouth daily.    [provider]  risperiDONE (RISPERDAL) 2 MG tablet Take 1 tablet (2 mg total) by mouth at bedtime. 11/06/20   Leatha Gilding, MD  rosuvastatin (CRESTOR) 40 MG tablet Take 40 mg by mouth daily. 12/31/18   [provider]  RYBELSUS 14 MG TABS Take 1 tablet by mouth every morning. 08/26/20   [provider]  tamsulosin (FLOMAX) 0.4 MG CAPS capsule Take 1 capsule (0.4 mg total) by mouth daily after supper for 7 days. 12/02/20 12/09/20  Marrion Coy, MD    Allergies Neomycin  Family History  Problem Relation Age of Onset   Dementia Mother    Arthritis Father     Social History Social History   Tobacco Use   Smoking status: Every Day    Packs/day: 0.50    Years: 20.00    Pack years: 10.00    Types: Cigarettes    Start date: 11/06/1984   Smokeless tobacco: Never  Vaping Use   Vaping Use: Never used   Substance Use Topics   Alcohol use: No    Alcohol/week: 0.0 standard drinks   Drug use: No    Review of Systems Unable to obtain secondary to altered mental status.  ____________________________________________   PHYSICAL EXAM:  VITAL SIGNS: ED Triage Vitals  Enc Vitals Group     BP      Pulse      Resp      Temp      Temp src      SpO2      Weight      Height      Head Circumference      Peak Flow      Pain Score      Pain Loc      Pain Edu?      Excl. in GC?  Constitutional: Somnolent and nonverbal. Eyes: Conjunctivae are normal.  Pupils equal, round, and reactive to light bilaterally. Head: Atraumatic. Nose: No congestion/rhinnorhea. Mouth/Throat: Mucous membranes are moist. Neck: Normal ROM Cardiovascular: Tachycardic, regular rhythm. Grossly normal heart sounds.  2+ radial pulses bilaterally. Respiratory: Tachypneic with marked respiratory distress and accessory muscle use.  Lungs with crackles and rhonchi bilaterally. Gastrointestinal: Soft and nontender. No distention. Genitourinary: deferred Musculoskeletal: No lower extremity tenderness nor edema. Neurologic: Spontaneous purposeful movements noted in all 4 extremities, patient reaching for and attempting to grab staff. Skin:  Skin is cool and clammy. Psychiatric: Unable to assess.  ____________________________________________   LABS (all labs ordered are listed, but only abnormal results are displayed)  Labs Reviewed  LACTIC ACID, PLASMA - Abnormal; Notable for the following components:      Result Value   Lactic Acid, Venous 2.2 (*)    All other components within normal limits  COMPREHENSIVE METABOLIC PANEL - Abnormal; Notable for the following components:   Sodium 146 (*)    Chloride 112 (*)    Glucose, Bld 177 (*)    BUN 24 (*)    Creatinine, Ser 1.23 (*)    Total Protein 6.4 (*)    Albumin 2.8 (*)    AST 43 (*)    GFR, Estimated 48 (*)    All other components within normal limits   CBC WITH DIFFERENTIAL/PLATELET - Abnormal; Notable for the following components:   WBC 16.8 (*)    RDW 16.0 (*)    Neutro Abs 11.4 (*)    Monocytes Absolute 1.7 (*)    Abs Immature Granulocytes 0.09 (*)    All other components within normal limits  URINALYSIS, COMPLETE (UACMP) WITH MICROSCOPIC - Abnormal; Notable for the following components:   Color, Urine AMBER (*)    APPearance CLOUDY (*)    Nitrite POSITIVE (*)    Leukocytes,Ua LARGE (*)    WBC, UA >50 (*)    Bacteria, UA MANY (*)    All other components within normal limits  BLOOD GAS, VENOUS - Abnormal; Notable for the following components:   pH, Ven 7.48 (*)    pCO2, Ven 34 (*)    pO2, Ven 50.0 (*)    Acid-Base Excess 2.2 (*)    All other components within normal limits  RESP PANEL BY RT-PCR (FLU A&B, COVID) ARPGX2  CULTURE, BLOOD (ROUTINE X 2)  CULTURE, BLOOD (ROUTINE X 2)  URINE CULTURE  PROTIME-INR  APTT  BRAIN NATRIURETIC PEPTIDE  LACTIC ACID, PLASMA  BLOOD GAS, ARTERIAL  PROCALCITONIN  TROPONIN I (HIGH SENSITIVITY)  TROPONIN I (HIGH SENSITIVITY)   ____________________________________________  EKG  ED ECG REPORT I, Chesley Noon, the attending physician, personally viewed and interpreted this ECG.   Date: 12/08/2020  EKG Time: 5:42  Rate: 103  Rhythm: sinus tachycardia  Axis: Normal  Intervals:none  ST&T Change: None   PROCEDURES  Procedure(s) performed (including Critical Care):  .Critical Care  Date/Time: 12/08/2020 7:11 AM Performed by: Chesley Noon, MD Authorized by: Chesley Noon, MD   Critical care provider statement:    Critical care time (minutes):  45   Critical care time was exclusive of:  Separately billable procedures and treating other patients and teaching time   Critical care was necessary to treat or prevent imminent or life-threatening deterioration of the following conditions:  Respiratory failure and sepsis   Critical care was time spent personally by me on the  following activities:  Discussions with consultants, evaluation of patient's response to treatment,  examination of patient, ordering and performing treatments and interventions, ordering and review of laboratory studies, ordering and review of radiographic studies, pulse oximetry, re-evaluation of patient's condition, obtaining history from patient or surrogate and review of old charts   I assumed direction of critical care for this patient from another provider in my specialty: no     Care discussed with: admitting provider     ____________________________________________   INITIAL IMPRESSION / ASSESSMENT AND PLAN / ED COURSE      70 year old female with past medical history of diabetes, bipolar disorder, and dementia who presents to the ED for altered mental status and respiratory distress.  Patient is in marked respiratory distress on arrival, tachypneic with accessory muscle use, significant rales and rhonchi noted on exam.  Patient clearly unable to tolerate BiPAP, high flow nasal cannula was attempted with no significant improvement in her work of breathing.  Patient appeared to become increasingly somnolent and decision was made to proceed with intubation.  Care discussed with patient's daughter via phone prior to intubation, who stated she was not sure what the patient would want to have done in this scenario.  She was going to talk with additional family members, however patient has been full code on all recent admissions and decision was made to proceed with intubation.  Patient also noted to be febrile and tachycardic, concerning for sepsis.  She was started on broad-spectrum antibiotics along with IV fluid resuscitation.  Chest x-ray following intubation reviewed by me and shows ET tube in the right mainstem bronchus.  ET tube was retracted but follow-up x-ray shows tube remains in the right mainstem bronchus and it was retracted for a second time.  Third x-ray now shows tube to be  appropriately positioned.  Patient noted to have low BP despite IV fluid bolus, we will start her on Levophed.  Case discussed with ICU provider for admission.      ____________________________________________   FINAL CLINICAL IMPRESSION(S) / ED DIAGNOSES  Final diagnoses:  Sepsis without acute organ dysfunction, due to unspecified organism [facility])  Acute respiratory failure with hypoxia [facility])     ED Discharge Orders     None        Note:  This document was prepared using Dragon voice recognition software and may include unintentional dictation errors.    Chesley Noon, MD 12/08/20 [phone]

## 2020-12-08 NOTE — Progress Notes (Signed)
Pharmacy Antibiotic Note  CHI WOODHAM is a 70 y.o. female admitted on 12/08/2020 with shortness of breath and altered mental status. Patient was intubated in the ED. Pharmacy has been consulted for vancomycin and cefepime dosing. Respiratory cultures are pending, also suspect UTI.  Plan: Vancomycin 2500 mg followed by vancomycin 750 mg IV q24h based on current renal function  Goal AUC 400-550 Expected AUC: 425 SCr used: 1.23  Cefepime 2 g IV q12h   Height: 5\' 3"  (160 cm) (from previous admission data) Weight: 101.9 kg (224 lb 10.4 oz) IBW/kg (Calculated) : 52.4  Temp (24hrs), Avg:102.4 F (39.1 C), Min:100.94 F (38.3 C), Max:103.2 F (39.6 C)  Recent Labs  Lab 12/02/20 0740 12/08/20 0547 12/08/20 0800  WBC  --  16.8*  --   CREATININE 0.70 1.23*  --   LATICACIDVEN  --  2.2* 1.7    Estimated Creatinine Clearance: 49.2 mL/min (A) (by C-G formula based on SCr of 1.23 mg/dL (H)).    No Known Allergies  Antimicrobials this admission: Vancomycin 7/25 >> Cefepime 7/25 >>   Microbiology results: 7/25 BCx: pending 7/25 UCx: pending  7/25 Sputum: pending  7/25 MRSA PCR: pending  Thank you for allowing pharmacy to be a part of this patient's care.  8/25, PharmD, BCPS 12/08/2020 10:28 AM

## 2020-12-08 NOTE — ED Triage Notes (Signed)
RSI   Etomate  20mg  @ 603 am   Roc 100 mg @ 0605 am     ET tube 7.5   24 in at the  lips   OG tube inserted

## 2020-12-08 NOTE — Progress Notes (Signed)
Elvina Sidle, NP gave order for 2 mg of versed for use during insertion of central line.

## 2020-12-08 NOTE — Progress Notes (Signed)
Spoke with Elvina Sidle, NP and asked him to look at patient's heart rhythm as it has changed. After discussing with NP and Dr. Jayme Cloud, patient is in atrial fibrillation, rate 60's.

## 2020-12-08 NOTE — Progress Notes (Signed)
Cooling blanket applied due to fever unrelieved by tylenol and ice packs.

## 2020-12-08 NOTE — ED Triage Notes (Signed)
Ems states they where called to the nursing home for a female pt with SOB. EMS arrived on scene to find pt breathing 60 times min.  Febrile 103.1 axillary and hypotensive .   Pt appears very sick , vitals as noted in chart   Hot to the touch , with gurgling sounds  coming from upper airway

## 2020-12-08 NOTE — Progress Notes (Signed)
Anticoagulation monitoring(Lovenox):  70 yo female ordered Lovenox 40 mg Q24h    There were no vitals filed for this visit. BMI    Lab Results  Component Value Date   CREATININE 1.23 (H) 12/08/2020   CREATININE 0.70 12/02/2020   CREATININE 0.72 12/01/2020   Estimated Creatinine Clearance: 49.6 mL/min (A) (by C-G formula based on SCr of 1.23 mg/dL (H)). Hemoglobin & Hematocrit     Component Value Date/Time   HGB 13.1 12/08/2020 0547   HGB 15.2 10/30/2012 1857   HCT 40.5 12/08/2020 0547   HCT 45.2 10/30/2012 1857     Per Protocol for Patient with estCrcl > 30 ml/min and BMI > 30, will transition to Lovenox 52.5 mg Q24h.

## 2020-12-08 NOTE — Progress Notes (Signed)
PHARMACY -  BRIEF ANTIBIOTIC NOTE   Pharmacy has received consult(s) for Vancomycin, Cefepime  from an ED provider.  The patient's profile has been reviewed for ht/wt/allergies/indication/available labs.    One time order(s) placed for Vancomycin 2500 mg IV 1 and Cefepime 2 gm IV X 1.   Further antibiotics/pharmacy consults should be ordered by admitting physician if indicated.                       Thank you, Keegen Heffern D 12/08/2020  6:01 AM

## 2020-12-09 ENCOUNTER — Inpatient Hospital Stay: Payer: Medicare Other

## 2020-12-09 ENCOUNTER — Encounter: Payer: Self-pay | Admitting: Pulmonary Disease

## 2020-12-09 DIAGNOSIS — Z7189 Other specified counseling: Secondary | ICD-10-CM

## 2020-12-09 DIAGNOSIS — A419 Sepsis, unspecified organism: Secondary | ICD-10-CM | POA: Diagnosis not present

## 2020-12-09 DIAGNOSIS — J9601 Acute respiratory failure with hypoxia: Secondary | ICD-10-CM | POA: Diagnosis not present

## 2020-12-09 DIAGNOSIS — E035 Myxedema coma: Secondary | ICD-10-CM | POA: Diagnosis not present

## 2020-12-09 LAB — GLUCOSE, CAPILLARY
Glucose-Capillary: 101 mg/dL — ABNORMAL HIGH (ref 70–99)
Glucose-Capillary: 156 mg/dL — ABNORMAL HIGH (ref 70–99)
Glucose-Capillary: 164 mg/dL — ABNORMAL HIGH (ref 70–99)
Glucose-Capillary: 181 mg/dL — ABNORMAL HIGH (ref 70–99)
Glucose-Capillary: 200 mg/dL — ABNORMAL HIGH (ref 70–99)
Glucose-Capillary: 201 mg/dL — ABNORMAL HIGH (ref 70–99)
Glucose-Capillary: 211 mg/dL — ABNORMAL HIGH (ref 70–99)

## 2020-12-09 LAB — CBC
HCT: 38.7 % (ref 36.0–46.0)
Hemoglobin: 12.4 g/dL (ref 12.0–15.0)
MCH: 31.3 pg (ref 26.0–34.0)
MCHC: 32 g/dL (ref 30.0–36.0)
MCV: 97.7 fL (ref 80.0–100.0)
Platelets: 405 10*3/uL — ABNORMAL HIGH (ref 150–400)
RBC: 3.96 MIL/uL (ref 3.87–5.11)
RDW: 15.7 % — ABNORMAL HIGH (ref 11.5–15.5)
WBC: 30.2 10*3/uL — ABNORMAL HIGH (ref 4.0–10.5)
nRBC: 0 % (ref 0.0–0.2)

## 2020-12-09 LAB — PROCALCITONIN: Procalcitonin: 0.97 ng/mL

## 2020-12-09 LAB — LEGIONELLA PNEUMOPHILA SEROGP 1 UR AG: L. pneumophila Serogp 1 Ur Ag: NEGATIVE

## 2020-12-09 LAB — BASIC METABOLIC PANEL
Anion gap: 6 (ref 5–15)
BUN: 20 mg/dL (ref 8–23)
CO2: 24 mmol/L (ref 22–32)
Calcium: 7.5 mg/dL — ABNORMAL LOW (ref 8.9–10.3)
Chloride: 110 mmol/L (ref 98–111)
Creatinine, Ser: 0.77 mg/dL (ref 0.44–1.00)
GFR, Estimated: >60 mL/min (ref >60)
Glucose, Bld: 220 mg/dL — ABNORMAL HIGH (ref 70–99)
Potassium: 3.5 mmol/L (ref 3.5–5.1)
Sodium: 140 mmol/L (ref 135–145)

## 2020-12-09 LAB — MAGNESIUM: Magnesium: 1.7 mg/dL (ref 1.7–2.4)

## 2020-12-09 LAB — HEPARIN LEVEL (UNFRACTIONATED)
Heparin Unfractionated: 0.57 IU/mL (ref 0.30–0.70)
Heparin Unfractionated: 0.29 IU/mL — ABNORMAL LOW (ref 0.30–0.70)
Heparin Unfractionated: 0.34 IU/mL (ref 0.30–0.70)
Heparin Unfractionated: 0.23 IU/mL — ABNORMAL LOW (ref 0.30–0.70)

## 2020-12-09 LAB — TSH: TSH: 101 u[IU]/mL — ABNORMAL HIGH (ref 0.350–4.500)

## 2020-12-09 LAB — T4, FREE: Free T4: 0.46 ng/dL — ABNORMAL LOW (ref 0.61–1.12)

## 2020-12-09 LAB — PHOSPHORUS: Phosphorus: 2.3 mg/dL — ABNORMAL LOW (ref 2.5–4.6)

## 2020-12-09 LAB — CORTISOL: Cortisol, Plasma: 55.7 ug/dL

## 2020-12-09 MED ORDER — NOREPINEPHRINE 16 MG/250ML-% IV SOLN
0.0000 ug/min | INTRAVENOUS | Status: DC
  Administered 2020-12-09: 25 ug/min via INTRAVENOUS
  Administered 2020-12-09: 29 ug/min via INTRAVENOUS
  Administered 2020-12-10: 22 ug/min via INTRAVENOUS
  Filled 2020-12-09 (×4): qty 250

## 2020-12-09 MED ORDER — JUVEN PO PACK
1.0000 | PACK | Freq: Two times a day (BID) | ORAL | Status: DC
  Administered 2020-12-10 – 2020-12-11 (×2): 1

## 2020-12-09 MED ORDER — LEVOTHYROXINE SODIUM 100 MCG/5ML IV SOLN
100.0000 ug | Freq: Once | INTRAVENOUS | Status: AC
  Administered 2020-12-09: 100 ug via INTRAVENOUS
  Filled 2020-12-09: qty 5

## 2020-12-09 MED ORDER — VITAL AF 1.2 CAL PO LIQD
1000.0000 mL | ORAL | Status: DC
  Administered 2020-12-09: 1000 mL

## 2020-12-09 MED ORDER — MIDAZOLAM BOLUS VIA INFUSION
1.0000 mg | INTRAVENOUS | Status: DC | PRN
Start: 2020-12-09 — End: 2020-12-09
  Filled 2020-12-09: qty 2

## 2020-12-09 MED ORDER — MIDAZOLAM 50MG/50ML (1MG/ML) PREMIX INFUSION
0.5000 mg/h | INTRAVENOUS | Status: DC
  Administered 2020-12-09: 1 mg/h via INTRAVENOUS
  Filled 2020-12-09: qty 50

## 2020-12-09 MED ORDER — DOPAMINE-DEXTROSE 3.2-5 MG/ML-% IV SOLN
INTRAVENOUS | Status: AC
  Filled 2020-12-09: qty 250

## 2020-12-09 MED ORDER — LEVOTHYROXINE SODIUM 100 MCG/5ML IV SOLN
100.0000 ug | Freq: Every day | INTRAVENOUS | Status: DC
  Administered 2020-12-09 – 2020-12-12 (×4): 100 ug via INTRAVENOUS
  Filled 2020-12-09 (×4): qty 5

## 2020-12-09 MED ORDER — POTASSIUM CHLORIDE 20 MEQ PO PACK
20.0000 meq | PACK | Freq: Once | ORAL | Status: AC
  Administered 2020-12-09: 20 meq
  Filled 2020-12-09: qty 1

## 2020-12-09 MED ORDER — HYDROCORTISONE NA SUCCINATE PF 100 MG IJ SOLR
50.0000 mg | Freq: Three times a day (TID) | INTRAMUSCULAR | Status: DC
  Administered 2020-12-09 – 2020-12-10 (×4): 50 mg via INTRAVENOUS
  Filled 2020-12-09 (×4): qty 2

## 2020-12-09 MED ORDER — MIDAZOLAM HCL 2 MG/2ML IJ SOLN
1.0000 mg | INTRAMUSCULAR | Status: DC | PRN
  Administered 2020-12-09: 1 mg via INTRAVENOUS

## 2020-12-09 MED ORDER — FREE WATER
30.0000 mL | Status: DC
  Administered 2020-12-09 – 2020-12-10 (×6): 30 mL

## 2020-12-09 MED ORDER — MAGNESIUM SULFATE 2 GM/50ML IV SOLN
2.0000 g | Freq: Once | INTRAVENOUS | Status: AC
  Administered 2020-12-09: 2 g via INTRAVENOUS
  Filled 2020-12-09: qty 50

## 2020-12-09 MED ORDER — HEPARIN BOLUS VIA INFUSION
1100.0000 [IU] | Freq: Once | INTRAVENOUS | Status: AC
  Administered 2020-12-09: 1100 [IU] via INTRAVENOUS
  Filled 2020-12-09: qty 1100

## 2020-12-09 MED ORDER — VANCOMYCIN HCL 1250 MG/250ML IV SOLN
1250.0000 mg | INTRAVENOUS | Status: DC
  Administered 2020-12-10: 1250 mg via INTRAVENOUS
  Filled 2020-12-09: qty 250

## 2020-12-09 MED ORDER — PROSOURCE TF PO LIQD
45.0000 mL | Freq: Two times a day (BID) | ORAL | Status: DC
  Administered 2020-12-09 – 2020-12-10 (×2): 45 mL
  Filled 2020-12-09 (×3): qty 45

## 2020-12-09 MED ORDER — DOPAMINE-DEXTROSE 3.2-5 MG/ML-% IV SOLN: 0.0000 ug/kg/min | INTRAVENOUS | Status: DC

## 2020-12-09 MED ORDER — SODIUM CHLORIDE 0.9 % IV BOLUS
1000.0000 mL | Freq: Once | INTRAVENOUS | Status: AC
  Administered 2020-12-09: 1000 mL via INTRAVENOUS

## 2020-12-09 MED ORDER — POTASSIUM PHOSPHATES 15 MMOLE/5ML IV SOLN
15.0000 mmol | Freq: Once | INTRAVENOUS | Status: AC
  Administered 2020-12-09: 15 mmol via INTRAVENOUS
  Filled 2020-12-09: qty 5

## 2020-12-09 MED ORDER — HEPARIN BOLUS VIA INFUSION
2000.0000 [IU] | Freq: Once | INTRAVENOUS | Status: AC
  Administered 2020-12-09: 2000 [IU] via INTRAVENOUS
  Filled 2020-12-09: qty 2000

## 2020-12-09 MED ORDER — SODIUM CHLORIDE 0.9 % IV SOLN
2.0000 g | Freq: Three times a day (TID) | INTRAVENOUS | Status: DC
  Administered 2020-12-09 – 2020-12-10 (×3): 2 g via INTRAVENOUS
  Filled 2020-12-09 (×4): qty 2

## 2020-12-09 NOTE — Progress Notes (Signed)
Spoke with Dr. Jayme Cloud at bedside and let MD know that when patient is stimulated with patient care that she starts shaking all over almost like she is shivering and becomes rigid and that is why patient was started on versed overnight. MD gave order to stop versed drip and to use PRN versed pushes if needed.

## 2020-12-09 NOTE — Progress Notes (Signed)
Pharmacy Antibiotic Note  Julie Morrow is a 70 y.o. female admitted on 12/08/2020 with shortness of breath and altered mental status. Patient was intubated in the ED. Pharmacy has been consulted for vancomycin and cefepime dosing. Respiratory cultures are pending, also suspect UTI.  Plan: Change vancomycin to 1250 mg IV q24h based on improved renal function  Goal AUC 400-550 Expected AUC: 490 SCr used: 0.8  Increase cefepime to 2 g IV q8h  Plan to de-escalate once sensitivities result.   Height: 5\' 3"  (160 cm) (from previous admission data) Weight: 102 kg (224 lb 13.9 oz) IBW/kg (Calculated) : 52.4  Temp (24hrs), Avg:101.1 F (38.4 C), Min:98.1 F (36.7 C), Max:102.56 F (39.2 C)  Recent Labs  Lab 12/08/20 0547 12/08/20 0800 12/09/20 0508  WBC 16.8*  --  30.2*  CREATININE 1.23*  --  0.77  LATICACIDVEN 2.2* 1.7  --      Estimated Creatinine Clearance: 75.6 mL/min (by C-G formula based on SCr of 0.77 mg/dL).    No Known Allergies  Antimicrobials this admission: Vancomycin 7/25 >> Cefepime 7/25 >>   Microbiology results: 7/25 BCx: NG 7/25 UCx: E.coli  7/25 Sputum: staph aureus  7/25 MRSA PCR: negative  Thank you for allowing pharmacy to be a part of this patient's care.  8/25, PharmD, BCPS 12/09/2020 12:01 PM

## 2020-12-09 NOTE — Consult Note (Signed)
ANTICOAGULATION CONSULT NOTE  Pharmacy Consult for Heparin gtt Indication: atrial fibrillation  No Known Allergies  Patient Measurements: Height: 5\' 3"  (160 cm) (from previous admission data) Weight: 102 kg (224 lb 13.9 oz) IBW/kg (Calculated) : 52.4 Heparin Dosing Weight: 76.4kg  Vital Signs: Temp: 100.2 F (37.9 C) (07/26 1600) Temp Source: Esophageal (07/26 1600) BP: 99/61 (07/26 1700) Pulse Rate: 60 (07/26 1700)  Labs: Recent Labs    12/08/20 0547 12/08/20 0800 12/09/20 0058 12/09/20 0508 12/09/20 0835 12/09/20 1715  HGB 13.1  --   --  12.4  --   --   HCT 40.5  --   --  38.7  --   --   PLT 360  --   --  405*  --   --   APTT 34  --   --   --   --   --   LABPROT 14.8  --   --   --   --   --   INR 1.2  --   --   --   --   --   HEPARINUNFRC  --   --  0.57  --  0.29* 0.34  CREATININE 1.23*  --   --  0.77  --   --   TROPONINIHS 17 14  --   --   --   --      Estimated Creatinine Clearance: 75.6 mL/min (by C-G formula based on SCr of 0.77 mg/dL).   Medications:  ASA 81 PTA; no AC. NKDA Heparin Dosing Weight: 76.4kg  Assessment: [name] w/ h/o bipolar disorder, diabetes, dementia who presents to the ED for w/u of AMS was admitted with Septic shock, Acute Hypoxic Respiratory Failure, and Acute Metabolic Encephalopathy in the setting of UTI and questionable HCAP requiring intubation in the ED and admission to ICU. Pharmacy consulted for mgmt of heparin gtt for Afib.  Goal of Therapy:  Heparin level 0.3-0.7 units/ml Monitor platelets by anticoagulation protocol: Yes   Plan:  Date Time aPTT/HL Rate/Comment 7/25 1326 ---   Dosed lovenox 0.5mg /kg   7/26  0058    0.57   Therapeutic 7/26 0835 0.29   Subtherapeutic 7/26 1715 0.34  Thera x1; 1300 un/hr  Plan: HL Therapeutic x1 Continue current heparin rate at 1300 units/hr Check confirmatory HL in 6hrs (2300)  Continue to monitor H&H and platelets  8/26, PharmD, [facility] Clinical  Pharmacist 12/09/2020 5:42 PM

## 2020-12-09 NOTE — Progress Notes (Signed)
Initial Nutrition Assessment  DOCUMENTATION CODES:   Obesity unspecified  INTERVENTION:   Initiate Vital 1.2 @50ml /hr + ProSource TF 14ml BID via tube   Free water flushes 71ml q4 hours to maintain tube patency   Regimen provides 1520kcal/day, 112g/day protein and 1136ml/day free water   Juven Fruit Punch BID via tube, each serving provides 95kcal and 2.5g of protein (amino acids glutamine and arginine)  NUTRITION DIAGNOSIS:   Inadequate oral intake related to inability to eat (pt sedated and ventilated) as evidenced by NPO status.  GOAL:   Provide needs based on ASPEN/SCCM guidelines  MONITOR:   Vent status, Labs, Weight trends, TF tolerance, Skin, I & O's  REASON FOR ASSESSMENT:   Ventilator    ASSESSMENT:   70 y.o. female with medical history significant for diabetes, bipolar mood disorder, chronic encephalopathy/dementia, class III obesity, hypothyroidism and recent admission coffee-ground emesis and aspiration PNA who is now admitted with AMS, sepsis, UTI and HCAP requiring intubation and ventilation  Pt sedated and ventilated. OGT in place. Plan is to start tube feeds today. Pt with good appetite and oral intake during her last admission; RD unsure if patient was eating well pta but she does appear weight stable since her last admission.   Medications reviewed and include: colace, solu-cortef, insulin, synthroid, protonix, miralax, cefepime, heparin, levophed, Kphos, vancomycin   Labs reviewed: K 3.5 wnl, P 2.3(L), Mg 1.7 wnl Wbc- 30.2(H) Cbgs- 181, 201, 156, 164 x 24 hrs AIC 6.5(H)- 5/3  Patient is currently intubated on ventilator support MV: 9.7 L/min Temp (24hrs), Avg:101.1 F (38.4 C), Min:98.1 F (36.7 C), Max:102.56 F (39.2 C)  Propofol: none   MAP- >53mmHg   UOP- 77m   NUTRITION - FOCUSED PHYSICAL EXAM:  Flowsheet Row Most Recent Value  Orbital Region No depletion  Upper Arm Region No depletion  Thoracic and Lumbar Region No depletion   Buccal Region No depletion  Temple Region Mild depletion  Clavicle Bone Region No depletion  Clavicle and Acromion Bone Region No depletion  Scapular Bone Region No depletion  Dorsal Hand No depletion  Patellar Region No depletion  Anterior Thigh Region No depletion  Posterior Calf Region No depletion  Edema (RD Assessment) Mild  Hair Reviewed  Eyes Reviewed  Mouth Reviewed  Skin Reviewed  Nails Reviewed   Diet Order:   Diet Order             Diet NPO time specified  Diet effective now                  EDUCATION NEEDS:   No education needs have been identified at this time  Skin:  Skin Assessment: Reviewed RN Assessment (Left ischium: 0.5 cm x 0.5 cm x0.1 cm, Sacrum: 4 cm x 4 cm intact maroon nonblanchable tissue, Left upper buttocks: 2 cm x 4 cm nonblanchable erythema, Scattered MASD To perineal skin)  Last BM:  7/25- type 7  Height:   Ht Readings from Last 1 Encounters:  12/08/20 5\' 3"  (1.6 m)    Weight:   Wt Readings from Last 1 Encounters:  12/09/20 102 kg    Ideal Body Weight:  52.2 kg  BMI:  Body mass index is 39.83 kg/m.  Estimated Nutritional Needs:   Kcal:  1122-1428kcal/day  Protein:  >105g/day  Fluid:  1.3-1.6L/day  MS, RD, LDN Please refer to [facility] for RD and/or RD on-call/weekend/after hours pager
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
## 2020-12-09 NOTE — Consult Note (Signed)
ANTICOAGULATION CONSULT NOTE  Pharmacy Consult for Heparin gtt Indication: atrial fibrillation  No Known Allergies  Patient Measurements: Height: 5\' 3"  (160 cm) (from previous admission data) Weight: 102 kg (224 lb 13.9 oz) IBW/kg (Calculated) : 52.4 Heparin Dosing Weight: 76.4kg  Vital Signs: Temp: 97.52 F (36.4 C) (07/26 2200) Temp Source: Esophageal (07/26 2000) BP: 116/63 (07/26 2200) Pulse Rate: 55 (07/26 2200)  Labs: Recent Labs    12/08/20 0547 12/08/20 0800 12/09/20 0058 12/09/20 0508 12/09/20 0835 12/09/20 1715 12/09/20 2302  HGB 13.1  --   --  12.4  --   --   --   HCT 40.5  --   --  38.7  --   --   --   PLT 360  --   --  405*  --   --   --   APTT 34  --   --   --   --   --   --   LABPROT 14.8  --   --   --   --   --   --   INR 1.2  --   --   --   --   --   --   HEPARINUNFRC  --   --    < >  --  0.29* 0.34 0.23*  CREATININE 1.23*  --   --  0.77  --   --   --   TROPONINIHS 17 14  --   --   --   --   --    < > = values in this interval not displayed.     Estimated Creatinine Clearance: 75.6 mL/min (by C-G formula based on SCr of 0.77 mg/dL).   Medications:  ASA 81 PTA; no AC. NKDA Heparin Dosing Weight: 76.4kg  Assessment: [name] w/ h/o bipolar disorder, diabetes, dementia who presents to the ED for w/u of AMS was admitted with Septic shock, Acute Hypoxic Respiratory Failure, and Acute Metabolic Encephalopathy in the setting of UTI and questionable HCAP requiring intubation in the ED and admission to ICU. Pharmacy consulted for mgmt of heparin gtt for Afib.  Goal of Therapy:  Heparin level 0.3-0.7 units/ml Monitor platelets by anticoagulation protocol: Yes   Plan:  Date Time aPTT/HL Rate/Comment 7/25 1326 ---   Dosed lovenox 0.5mg /kg   7/26  0058    0.57   Therapeutic 7/26 0835 0.29   Subtherapeutic 7/26 1715 0.34  Thera x1; 1300 un/hr 7/26 2302 0.23  Subtherapeutic @ 1300 units/hr  Plan: HL subtherapeutic x1 Increase current heparin  rate to 1500 units/hr Bolus 2000 units Check HL in 6hrs (0600)  Continue to monitor H&H and platelets  8/26, PharmD, [facility] Clinical Pharmacist 12/09/2020 11:30 PM

## 2020-12-09 NOTE — Progress Notes (Signed)
NAME:  Julie Morrow, MRN:  [id number], DOB:  Feb 20, 1951, LOS: 1 ADMISSION DATE:  12/08/2020, CONSULTATION DATE:  12/08/2020 REFERRING MD:  Dr. Charna Archer, CHIEF COMPLAINT:  Altered Mental status and Respiratory Distress  Brief Pt Description / Synopsis:  [name] admitted with Septic shock, Acute Hypoxic Respiratory Failure, and Acute Metabolic Encephalopathy in the setting of UTI and questionable Healthcare Associated Pneumonia requiring intubation in the ED.  History of Present Illness:  Julie Morrow is a [name] with a past medical history as listed below who presented to [facility] ED on 12/08/20 from [facility] due to altered mental status and acute respiratory distress.  Pt is currently intubated and sedated, and no family currently is available, therefore history is obtained from ED and nursing notes.  Per notes, EMS was dispatched due to altered mental status and concern for worsening pneumonia.  She was reportedly started on Levaquin yesterday due to concern for RLL Pneumonia, but has continued to decline since then.  EMS found the patient somnolent, febrile with temperature 103.1, tachypnea, and hypoxic with O2 sats in the lows 80's on room air.  ED Course: Upon presentation to the ED, she remained somnolent and in respiratory distress with significant rales and rhonchi noted on exam.  Decision was made to emergently intubate. Initial Vital Signs: Temperature 103.2, RR 42, Pulse 97, BP 80/48, O2 sats 91% on 65% HFNC Imaging:  Chest X-ray>> mild pulmonary vascular congestion with left basilar opacity (atelectasis vs. PNA) and small pleural effusion Abdomina X-ray>>The enteric tube tip and side port are well below the level of the GE junction and projects over the expected location of the distal stomach. Bowel gas pattern is unremarkable. Labs: Na 146, glucose 177, BUN 24, Creatinine 1.23,AST 43, ALT 33, BNP 48.8, HS Troponin 17 ~ 14, lactic acid 2.2 ~ 1.7, Procalcitonin  0.18, WBC 16.8 w/ Neutrophilia Urinalysis + UTI SARS-CoV-2 is negative  EKG Interpretation Date: 12/08/2020 EKG Time: 05:42 Rate: 103 Rhythm: sinus tachycardia QRS Axis:  Normal Intervals:  none ST/T Wave abnormalities: none  Narrative Interpretation: Sinus Tachycardia without acute ischemic changes  She was hypotensive and also met sepsis criteria (fever 103, HR >90, RR >20, WBC 16.8), therefore she 3.5 liter of IV fluid resuscitation along with broad spectrum antibiotics including IV Cefepime and Vancomycin.  Despite IV fluid resuscitation she remained hypotension, requiring initiation of Levophed infusion.  PCCM is consulted for further workup and treatment of Septic shock, Acute Hypoxic Respiratory Failure, and Acute Metabolic Encephalopathy in the setting of UTI and questionable Healthcare Associated Pneumonia.  Pertinent  Medical History  Dementia Bipolar Disorder Depression Anxiety Diabetes Mellitus Hypothyroidism  Micro Data:  12/08/20: SARS-CoV-2 & Influenza PCR>>negative 12/08/20: Blood culture x2>> 12/08/20: Urine>> E. coli, sensitivities pending 12/08/20: Tracheal aspirate>> staff aureus, sensitivities pending 12/08/20: Strep pneumo urinary antigen>>Negative 12/08/20: Legionella urinary antigen>>  Antimicrobials:  Cefepime 7/25>> Vancomycin 7/25>>  Significant Hospital Events: Including procedures, antibiotic start and stop dates in addition to other pertinent events   12/08/20: presented to ED with AMS and hypoxia, intubated in ED, PCCM consulted for admit 12/09/20: Episodes of bradycardia, TSH =101, medical picture consistent with myxedema coma  Interim History / Subjective:  -Persistent episodes of bradycardia -Hypotensive despite IVF resuscitation, now requiring vasopressors -Critically ill -TSH checked, 101, 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 rate (!) 21, height _0  (1.6 m), weight  102 kg, SpO2 99 %. CVP:  [  0 mmHg-37 mmHg] 9 mmHg  Vent Mode: PRVC FiO2 (%):  [30 %-40 %] 30 % Set Rate:  [20 bmp] 20 bmp Vt Set:  [450 mL] 450 mL PEEP:  [5 cmH20] 5 cmH20   Intake/Output Summary (Last 24 hours) at 12/09/2020 0955 Last data filed at 12/09/2020 0805 Gross per 24 hour  Intake 3939.82 ml  Output 635 ml  Net 3304.82 ml    Filed Weights   12/08/20 0930 12/09/20 0500  Weight: 101.9 kg 102 kg    Examination: General: Critically ill appearing female, appears older than stated age, laying in bed, intubated and sedated, in NAD HENT: Atraumatic, normocephalic, neck supple, no JVD Lungs: Coarse rhonchi throughout, vent assisted, overbreathing the vent, even Cardiovascular: Bradycardic with regular rhythm, s1s2, no M/R/G Abdomen: Obese, soft, nontender, nondistended, no guarding or rebound tenderness, BS+ x4 Extremities: No deformities, nonpitting edema BLE Neuro: Sedated, withdraws from pain, does not follow commands, pupils PERRLA (brisk 2 mm bilaterally) GU: Foley catheter in place Skin: Deep tissue injury to sacrum (present on admission) and moisture associated damage to perineal area, stasis changes bilaterally lower extremities  Resolved Hospital Problem list     Assessment & Plan:   Acute Hypoxic Respiratory Failure secondary to suspected HCAP -Full vent support, implement lung protective strategies -Wean FiO2 & PEEP as tolerated to maintain O2 sats >92% -Follow intermittent Chest X-ray & ABG as needed -Spontaneous Breathing Trials when respiratory parameters met and mental status permits -Continue VAP Bundle -PRN Bronchodilators -ABX as above -Wean off sedatives as tolerates  Shock: Multifactorial Septic, myxedema coma -Continuous cardiac monitoring -Maintain MAP >65 -IV fluid resuscitated -Vasopressors as needed to maintain MAP goal -Start Midodrine -Added hydrocortisone given myxedema -Lactic acid normalized -HS Troponin negative x2 (17 ~  14)  Severe Sepsis in the setting of UTI and Staph aureus HCAP -Monitor fever curve -Trend WBC's & Procalcitonin -Follow cultures as above -Continue empiric Cefepime & Vancomycin pending cultures & sensitivities  Acute Kidney Injury Mild Hypernatremia -Monitor I&O's / urinary output -Follow BMP -Ensure adequate renal perfusion -Avoid nephrotoxic agents as able -Replace electrolytes as indicated -Pharmacy following for assistance with electrolyte replacement -IV Fluids as needed  Diabetes Mellitus PMHx of Hypothyroidism 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 -CBG's -SSI -Follow ICU Hypo/Hyperglycemia protocol -Check Hgb A1c -Switch Synthroid to IV -Check TSH and free T4  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 Sedation needs in setting of mechanical ventilation PMHx of Bipolar disorder, Anxiety, Depression -Maintain a RASS goal of 0 to -1 -Versed as needed -Propofol/Precedex aggravated bradycardia -Avoid sedating medications as able -Daily wake up assessment -Provide supportive care -Treat myxedema coma Synthroid 200 mcg IV x1 today then 100 mcg daily IV, hydrocortisone 50 mg IV every 8  Deep Tissue Injury to sacrum & Stage II Pressure injury to Left Ischium (present on admission) -Wound care consulted, appreciate input -Turn q2h   Best Practice (right click and "Reselect all SmartList Selections" daily)   Diet/type: NPO, begin tube feeds DVT prophylaxis: LMWH GI prophylaxis: PPI Lines: N/A Foley: Present, still needed Code Status:  full code Last date of multidisciplinary goals of care discussion [12/08/2020]  Labs   CBC: Recent Labs  Lab 12/08/20 0547 12/09/20 0508  WBC 16.8* 30.2*  NEUTROABS 11.4*  --   HGB 13.1 12.4  HCT 40.5 38.7  MCV 98.5 97.7  PLT 360 405*     Basic Metabolic Panel: Recent Labs  Lab 12/08/20 0547 12/09/20 0508  NA  146* 140  K 4.1 3.5  CL 112* 110  CO2 24 24  GLUCOSE 177* 220*  BUN 24*  20  CREATININE 1.23* 0.77  CALCIUM 8.9 7.5*  MG  --  1.7  PHOS  --  2.3*    GFR: Estimated Creatinine Clearance: 75.6 mL/min (by C-G formula based on SCr of 0.77 mg/dL). Recent Labs  Lab 12/08/20 0547 12/08/20 0800 12/09/20 0508  PROCALCITON 0.18  --  0.97  WBC 16.8*  --  30.2*  LATICACIDVEN 2.2* 1.7  --      Liver Function Tests: Recent Labs  Lab 12/08/20 0547  AST 43*  ALT 33  ALKPHOS 77  BILITOT 0.8  PROT 6.4*  ALBUMIN 2.8*    No results for input(s): LIPASE, AMYLASE in the last 168 hours. No results for input(s): AMMONIA in the last 168 hours.  ABG    Component Value Date/Time   PHART 7.44 12/08/2020 0730   PCO2ART 32 12/08/2020 0730   PO2ART 224 (H) 12/08/2020 0730   HCO3 21.7 12/08/2020 0730   TCO2 28 10/24/2018 0933   ACIDBASEDEF 1.8 12/08/2020 0730   O2SAT 99.8 12/08/2020 0730      Coagulation Profile: Recent Labs  Lab 12/08/20 0547  INR 1.2   TSH = 101 (0.35-4.5) Free T4 = 0.46 (0.61-1.12)   Cardiac Enzymes: No results for input(s): CKTOTAL, CKMB, CKMBINDEX, TROPONINI in the last 168 hours.  HbA1C: Hemoglobin A1C  Date/Time Value Ref Range Status  06/16/2011 12:47 PM 6.2 4.2 - 6.3 % Final    Comment:    The American Diabetes Association recommends that a primary goal of therapy should be <7% and that physicians should reevaluate the treatment regimen in patients with HbA1c values consistently >8%.    Hgb A1c MFr Bld  Date/Time Value Ref Range Status  09/16/2020 04:21 PM 6.5 (H) 4.8 - 5.6 % Final    Comment:    (NOTE) Pre diabetes:          5.7%-6.4%  Diabetes:              >6.4%  Glycemic control for   <7.0% adults with diabetes   12/28/2018 07:04 AM 6.1 (H) 4.8 - 5.6 % Final    Comment:    (NOTE) Pre diabetes:          5.7%-6.4% Diabetes:              >6.4% Glycemic control for   <7.0% adults with diabetes     CBG: Recent Labs  Lab 12/08/20 1641 12/08/20 1924 12/09/20 0006 12/09/20 0357 12/09/20 0738   GLUCAP 181* 173* 181* 201* 156*    Review of Systems:   Unable to assess due to intubation, sedation, and critical illness  Allergies No Known Allergies   Scheduled Meds:  chlorhexidine gluconate (MEDLINE KIT)  15 mL Mouth Rinse BID   Chlorhexidine Gluconate Cloth  6 each Topical Q0600   docusate  100 mg Per Tube BID   fentaNYL (SUBLIMAZE) injection  25 mcg Intravenous Once   hydrocortisone sod succinate (SOLU-CORTEF) inj  50 mg Intravenous Q8H   insulin aspart  0-15 Units Subcutaneous Q4H   levothyroxine  100 mcg Intravenous Q0600   mouth rinse  15 mL Mouth Rinse 10 times per day   midodrine  10 mg Per Tube TID WC   pantoprazole (PROTONIX) IV  40 mg Intravenous QHS   polyethylene glycol  17 g Per Tube Daily   Continuous Infusions:  sodium chloride Stopped (12/08/20 0725)  ceFEPime (MAXIPIME) IV Stopped (12/08/20 1804)   DOPamine     DOPamine     fentaNYL infusion INTRAVENOUS 200 mcg/hr (12/09/20 0800)   heparin 1,300 Units/hr (12/09/20 0935)   midazolam Stopped (12/09/20 0943)   norepinephrine (LEVOPHED) Adult infusion 30 mcg/min (12/09/20 0800)   potassium PHOSPHATE IVPB (in mmol) 15 mmol (12/09/20 0823)   vancomycin 750 mg (12/09/20 0938)   PRN Meds:.acetaminophen **OR** acetaminophen, docusate sodium, fentaNYL, midazolam, polyethylene glycol   Critical care time: 45 minutes    The patient is critically ill with multiple organ systems failure and requires high complexity decision making for assessment and support, frequent evaluation and titration of therapies, application of advanced monitoring technologies and extensive interpretation of multiple databases. Critical Care Time devoted to patient care services described in this note is  45 minutes.  Patient was discussed during ICU multidisciplinary rounds.  Renold Don, MD Advanced Bronchoscopy PCCM  Pulmonary-[facility]    *This note was dictated using voice recognition software/Dragon.  Despite  best efforts to proofread, errors can occur which can change the meaning.  Any change was purely unintentional.

## 2020-12-09 NOTE — Consult Note (Signed)
ANTICOAGULATION CONSULT NOTE  Pharmacy Consult for Heparin gtt Indication: atrial fibrillation  No Known Allergies  Patient Measurements: Height: 5\' 3"  (160 cm) (from previous admission data) Weight: 102 kg (224 lb 13.9 oz) IBW/kg (Calculated) : 52.4 Heparin Dosing Weight: 76.4kg  Vital Signs: Temp: 100.22 F (37.9 C) (07/26 0900) Temp Source: Esophageal (07/26 0800) BP: 98/66 (07/26 0900) Pulse Rate: 58 (07/26 0900)  Labs: Recent Labs    12/08/20 0547 12/08/20 0800 12/09/20 0058 12/09/20 0508 12/09/20 0835  HGB 13.1  --   --  12.4  --   HCT 40.5  --   --  38.7  --   PLT 360  --   --  405*  --   APTT 34  --   --   --   --   LABPROT 14.8  --   --   --   --   INR 1.2  --   --   --   --   HEPARINUNFRC  --   --  0.57  --  0.29*  CREATININE 1.23*  --   --  0.77  --   TROPONINIHS 17 14  --   --   --      Estimated Creatinine Clearance: 75.6 mL/min (by C-G formula based on SCr of 0.77 mg/dL).   Medications:  ASA 81 PTA; no AC. NKDA Heparin Dosing Weight: 76.4kg  Assessment: [name] w/ h/o bipolar disorder, diabetes, dementia who presents to the ED for w/u of AMS was admitted with Septic shock, Acute Hypoxic Respiratory Failure, and Acute Metabolic Encephalopathy in the setting of UTI and questionable HCAP requiring intubation in the ED and admission to ICU. Pharmacy consulted for mgmt of heparin gtt for Afib.  Goal of Therapy:  Heparin level 0.3-0.7 units/ml Monitor platelets by anticoagulation protocol: Yes   Plan:  Date Time aPTT/HL Rate/Comment 7/25 1326 ---   Dosed lovenox 0.5mg /kg   7/26  0058    0.57   Therapeutic x 1 7/26 0835 0.29   Subtherapeutic   Plan: HL subtherapeutic Heparin 1100 unit bolus Increase heparin infusion to 1300 units/hr Recheck HL at 1600 Continue to monitor H&H and platelets  8/26, PharmD, [facility] Clinical Pharmacist 12/09/2020 9:18 AM

## 2020-12-09 NOTE — Progress Notes (Signed)
Precedex drip had to be stopped due to heart rate dropping into 30's, Versed started. Pt is very sensitive to levophed, if it is paused her blood pressure will drop quickly, it does rebound well once levophed is restarted. Responds to pain and stimuli by quivering and tightening up.

## 2020-12-09 NOTE — Consult Note (Signed)
ANTICOAGULATION CONSULT NOTE - Follow Up Consult  Pharmacy Consult for Heparin gtt Indication: atrial fibrillation  No Known Allergies  Patient Measurements: Height: 5\' 3"  (160 cm) (from previous admission data) Weight: 101.9 kg (224 lb 10.4 oz) IBW/kg (Calculated) : 52.4 Heparin Dosing Weight: 76.4kg  Vital Signs: Temp: 99.9 F (37.7 C) (07/25 2200) Temp Source: Esophageal (07/25 2200) BP: 105/59 (07/25 2300) Pulse Rate: 53 (07/25 2300)  Labs: Recent Labs    12/08/20 0547 12/08/20 0800 12/09/20 0058  HGB 13.1  --   --   HCT 40.5  --   --   PLT 360  --   --   APTT 34  --   --   LABPROT 14.8  --   --   INR 1.2  --   --   HEPARINUNFRC  --   --  0.57  CREATININE 1.23*  --   --   TROPONINIHS 17 14  --      Estimated Creatinine Clearance: 49.2 mL/min (A) (by C-G formula based on SCr of 1.23 mg/dL (H)).   Medications:  ASA 81 PTA; no AC. NKDA Heparin Dosing Weight: 76.4kg  Assessment: [name] w/ h/o bipolar disorder, diabetes, dementia who presents to the ED for w/u of AMS was admitted with Septic shock, Acute Hypoxic Respiratory Failure, and Acute Metabolic Encephalopathy in the setting of UTI and questionable HCAP requiring intubation in the ED and admission to ICU. Pharmacy consulted for mgmt of heparin gtt for Afib.  Goal of Therapy:  Heparin level 0.3-0.7 units/ml Monitor platelets by anticoagulation protocol: Yes   Plan:  Date Time aPTT/HL Rate/Comment 7/25 1326 ---   Dosed lovenox 0.5mg /kg   7/26  0058    0.57   Therapeutic x 1  Baseline Labs: aPTT - 34s INR - 1.2 Hgb - 13.1 Plts - 360   Plan: Normal baseline labs, no PTA AC and only ppx LMWH received this admission.   HL 0.57 - therapeutic x 1  Continue heparin infusion at 1200 units/hr Check anti-Xa level in 6 hours for confirmation  Continue to monitor H&H and platelets  8/26, PharmD, BCPS Clinical Pharmacist 12/09/2020 2:00 AM

## 2020-12-10 DIAGNOSIS — J9601 Acute respiratory failure with hypoxia: Secondary | ICD-10-CM | POA: Diagnosis not present

## 2020-12-10 DIAGNOSIS — E035 Myxedema coma: Secondary | ICD-10-CM | POA: Diagnosis not present

## 2020-12-10 LAB — URINE CULTURE: Culture: >=100000 — AB

## 2020-12-10 LAB — GLUCOSE, CAPILLARY
Glucose-Capillary: 139 mg/dL — ABNORMAL HIGH (ref 70–99)
Glucose-Capillary: 164 mg/dL — ABNORMAL HIGH (ref 70–99)
Glucose-Capillary: 231 mg/dL — ABNORMAL HIGH (ref 70–99)
Glucose-Capillary: 242 mg/dL — ABNORMAL HIGH (ref 70–99)
Glucose-Capillary: 274 mg/dL — ABNORMAL HIGH (ref 70–99)
Glucose-Capillary: 80 mg/dL (ref 70–99)

## 2020-12-10 LAB — BASIC METABOLIC PANEL
Anion gap: 8 (ref 5–15)
BUN: 19 mg/dL (ref 8–23)
CO2: 21 mmol/L — ABNORMAL LOW (ref 22–32)
Calcium: 7.6 mg/dL — ABNORMAL LOW (ref 8.9–10.3)
Chloride: 109 mmol/L (ref 98–111)
Creatinine, Ser: 0.67 mg/dL (ref 0.44–1.00)
GFR, Estimated: >60 mL/min (ref >60)
Glucose, Bld: 294 mg/dL — ABNORMAL HIGH (ref 70–99)
Potassium: 4 mmol/L (ref 3.5–5.1)
Sodium: 138 mmol/L (ref 135–145)

## 2020-12-10 LAB — CBC
HCT: 36.3 % (ref 36.0–46.0)
Hemoglobin: 12 g/dL (ref 12.0–15.0)
MCH: 31.9 pg (ref 26.0–34.0)
MCHC: 33.1 g/dL (ref 30.0–36.0)
MCV: 96.5 fL (ref 80.0–100.0)
Platelets: 423 10*3/uL — ABNORMAL HIGH (ref 150–400)
RBC: 3.76 MIL/uL — ABNORMAL LOW (ref 3.87–5.11)
RDW: 15.6 % — ABNORMAL HIGH (ref 11.5–15.5)
WBC: 42.8 10*3/uL — ABNORMAL HIGH (ref 4.0–10.5)
nRBC: 0 % (ref 0.0–0.2)

## 2020-12-10 LAB — CULTURE, RESPIRATORY W GRAM STAIN

## 2020-12-10 LAB — MAGNESIUM: Magnesium: 2.2 mg/dL (ref 1.7–2.4)

## 2020-12-10 LAB — HEMOGLOBIN A1C
Hgb A1c MFr Bld: 6.3 % — ABNORMAL HIGH (ref 4.8–5.6)
Mean Plasma Glucose: 134 mg/dL

## 2020-12-10 LAB — HEPARIN LEVEL (UNFRACTIONATED): Heparin Unfractionated: 0.37 IU/mL (ref 0.30–0.70)

## 2020-12-10 LAB — PHOSPHORUS: Phosphorus: 2.1 mg/dL — ABNORMAL LOW (ref 2.5–4.6)

## 2020-12-10 LAB — PROCALCITONIN: Procalcitonin: 1.85 ng/mL

## 2020-12-10 MED ORDER — HEPARIN SODIUM (PORCINE) 5000 UNIT/ML IJ SOLN
5000.0000 [IU] | Freq: Three times a day (TID) | INTRAMUSCULAR | Status: DC
  Administered 2020-12-10 – 2020-12-15 (×15): 5000 [IU] via SUBCUTANEOUS
  Filled 2020-12-10 (×15): qty 1

## 2020-12-10 MED ORDER — FUROSEMIDE 10 MG/ML IJ SOLN
20.0000 mg | Freq: Once | INTRAMUSCULAR | Status: AC
  Administered 2020-12-10: 20 mg via INTRAVENOUS
  Filled 2020-12-10: qty 2

## 2020-12-10 MED ORDER — THIAMINE HCL 100 MG/ML IJ SOLN
100.0000 mg | Freq: Every day | INTRAMUSCULAR | Status: DC
  Administered 2020-12-10 – 2020-12-15 (×6): 100 mg via INTRAVENOUS
  Filled 2020-12-10 (×6): qty 2

## 2020-12-10 MED ORDER — CEFAZOLIN SODIUM-DEXTROSE 2-4 GM/100ML-% IV SOLN
2.0000 g | Freq: Three times a day (TID) | INTRAVENOUS | Status: DC
  Administered 2020-12-10 – 2020-12-15 (×13): 2 g via INTRAVENOUS
  Filled 2020-12-10 (×19): qty 100

## 2020-12-10 MED ORDER — ORAL CARE MOUTH RINSE: 15.0000 mL | Freq: Two times a day (BID) | OROMUCOSAL | Status: DC

## 2020-12-10 MED ORDER — HYDROCORTISONE NA SUCCINATE PF 100 MG IJ SOLR
50.0000 mg | Freq: Two times a day (BID) | INTRAMUSCULAR | Status: DC
  Administered 2020-12-10 – 2020-12-11 (×2): 50 mg via INTRAVENOUS
  Filled 2020-12-10 (×2): qty 2

## 2020-12-10 NOTE — Consult Note (Signed)
ANTICOAGULATION CONSULT NOTE  Pharmacy Consult for Heparin gtt Indication: atrial fibrillation  No Known Allergies  Patient Measurements: Height: 5\' 3"  (160 cm) (from previous admission data) Weight: 103.2 kg (227 lb 8.2 oz) IBW/kg (Calculated) : 52.4 Heparin Dosing Weight: 76.4kg  Vital Signs: Temp: 98.78 F (37.1 C) (07/27 0430) Temp Source: Esophageal (07/27 0400) BP: 124/61 (07/27 0430) Pulse Rate: 55 (07/27 0430)  Labs: Recent Labs    12/08/20 0547 12/08/20 0800 12/09/20 0058 12/09/20 0508 12/09/20 0835 12/09/20 1715 12/09/20 2302 12/10/20 0539  HGB 13.1  --   --  12.4  --   --   --  12.0  HCT 40.5  --   --  38.7  --   --   --  36.3  PLT 360  --   --  405*  --   --   --  423*  APTT 34  --   --   --   --   --   --   --   LABPROT 14.8  --   --   --   --   --   --   --   INR 1.2  --   --   --   --   --   --   --   HEPARINUNFRC  --   --    < >  --    < > 0.34 0.23* 0.37  CREATININE 1.23*  --   --  0.77  --   --   --   --   TROPONINIHS 17 14  --   --   --   --   --   --    < > = values in this interval not displayed.     Estimated Creatinine Clearance: 76.2 mL/min (by C-G formula based on SCr of 0.77 mg/dL).   Medications:  ASA 81 PTA; no AC. NKDA Heparin Dosing Weight: 76.4kg  Assessment: [name] w/ h/o bipolar disorder, diabetes, dementia who presents to the ED for w/u of AMS was admitted with Septic shock, Acute Hypoxic Respiratory Failure, and Acute Metabolic Encephalopathy in the setting of UTI and questionable HCAP requiring intubation in the ED and admission to ICU. Pharmacy consulted for mgmt of heparin gtt for Afib.  Goal of Therapy:  Heparin level 0.3-0.7 units/ml Monitor platelets by anticoagulation protocol: Yes   Plan:  Date Time aPTT/HL Rate/Comment 7/25 1326 ---   Dosed lovenox 0.5mg /kg   7/26  0058    0.57   Therapeutic 7/26 0835 0.29   Subtherapeutic 7/26 1715 0.34  Thera x1; 1300 un/hr 7/26 2302 0.23  Subtherapeutic @ 1300  units/hr 7/27 0539 0.37  Therapeutic x 1 @ 1500 units/hr   Plan: HL therapeutic x1 Continue current heparin rate to 1500 units/hr Check HL in 6hrs (0600)  Continue to monitor H&H and platelets  8/27, PharmD, BCPS Clinical Pharmacist 12/10/2020 6:18 AM

## 2020-12-10 NOTE — Progress Notes (Signed)
NAME:  Julie Morrow, MRN:  [id number], DOB:  12/01/1950, LOS: 2 ADMISSION DATE:  12/08/2020, CONSULTATION DATE:  12/08/2020 REFERRING MD:  Dr. Charna Archer, CHIEF COMPLAINT:  Altered Mental status and Respiratory Distress  Brief Pt Description / Synopsis:  [name] admitted with Septic shock, Acute Hypoxic Respiratory Failure, and Acute Metabolic Encephalopathy in the setting of UTI and questionable Healthcare Associated Pneumonia requiring intubation in the ED.  History of Present Illness:  Julie Morrow is a [name] with a past medical history as listed below who presented to [facility] ED on 12/08/20 from [facility] due to altered mental status and acute respiratory distress.  Pt is currently intubated and sedated, and no family currently is available, therefore history is obtained from ED and nursing notes.  Per notes, EMS was dispatched due to altered mental status and concern for worsening pneumonia.  She was reportedly started on Levaquin yesterday due to concern for RLL Pneumonia, but has continued to decline since then.  EMS found the patient somnolent, febrile with temperature 103.1, tachypnea, and hypoxic with O2 sats in the lows 80's on room air.  ED Course: Upon presentation to the ED, she remained somnolent and in respiratory distress with significant rales and rhonchi noted on exam.  Decision was made to emergently intubate. Initial Vital Signs: Temperature 103.2, RR 42, Pulse 97, BP 80/48, O2 sats 91% on 65% HFNC Imaging:  Chest X-ray>> mild pulmonary vascular congestion with left basilar opacity (atelectasis vs. PNA) and small pleural effusion Abdomina X-ray>>The enteric tube tip and side port are well below the level of the GE junction and projects over the expected location of the distal stomach. Bowel gas pattern is unremarkable. Labs: Na 146, glucose 177, BUN 24, Creatinine 1.23,AST 43, ALT 33, BNP 48.8, HS Troponin 17 ~ 14, lactic acid 2.2 ~ 1.7, Procalcitonin  0.18, WBC 16.8 w/ Neutrophilia Urinalysis + UTI SARS-CoV-2 is negative  EKG Interpretation Date: 12/08/2020 EKG Time: 05:42 Rate: 103 Rhythm: sinus tachycardia QRS Axis:  Normal Intervals:  none ST/T Wave abnormalities: none  Narrative Interpretation: Sinus Tachycardia without acute ischemic changes  She was hypotensive and also met sepsis criteria (fever 103, HR >90, RR >20, WBC 16.8), therefore she 3.5 liter of IV fluid resuscitation along with broad spectrum antibiotics including IV Cefepime and Vancomycin.  Despite IV fluid resuscitation she remained hypotension, requiring initiation of Levophed infusion.  PCCM is consulted for further workup and treatment of Septic shock, Acute Hypoxic Respiratory Failure, and Acute Metabolic Encephalopathy in the setting of UTI and questionable Healthcare Associated Pneumonia.  Pertinent  Medical History  Dementia Bipolar Disorder Depression Anxiety Diabetes Mellitus Hypothyroidism  Micro Data:  12/08/20: SARS-CoV-2 & Influenza PCR>>negative 12/08/20: Blood culture x2>> negative 12/08/20: Urine>> E. coli, sensitivities pending 12/08/20: Tracheal aspirate>>Staph aureus, MSSA 12/08/20: Strep pneumo urinary antigen>>Negative 12/08/20: Legionella urinary antigen>> negative  Antimicrobials:  Cefepime 7/25>> 7/27 Vancomycin 7/25>> 7/27 Cefazolin 7/27>>  Significant Hospital Events: Including procedures, antibiotic start and stop dates in addition to other pertinent events   12/08/20: presented to ED with AMS and hypoxia, intubated in ED, PCCM consulted for admit 12/09/20: Episodes of bradycardia, TSH =101, medical picture consistent with myxedema coma 12/10/20: Tolerating SBT/SAT  Interim History / Subjective:  -Bradycardia not as profound -Hemodynamics improved -Regards examiner, tracks, inconsistent following of simple commands  Objective   Blood pressure 102/78, pulse (!) 106, temperature 98.9 F (37.2 C), temperature source Axillary,  resp. rate (!) 23, height _0  (1.6 m), weight 103.2  kg, SpO2 95 %. CVP:  [2 mmHg-19 mmHg] 13 mmHg  Vent Mode: PSV FiO2 (%):  [30 %] 30 % Set Rate:  [20 bmp] 20 bmp Vt Set:  [450 mL] 450 mL PEEP:  [5 cmH20] 5 cmH20 Pressure Support:  [5 cmH20] 5 cmH20   Intake/Output Summary (Last 24 hours) at 12/10/2020 1613 Last data filed at 12/10/2020 1501 Gross per 24 hour  Intake 2331.08 ml  Output 845 ml  Net 1486.08 ml    Filed Weights   12/08/20 0930 12/09/20 0500 12/10/20 0350  Weight: 101.9 kg 102 kg 103.2 kg    Examination: General: Critically ill appearing female, appears older than stated age, laying in bed, intubated and sedated, synchronous with the ventilator  HENT: Atraumatic, normocephalic, neck supple, no JVD Lungs: Coarse rhonchi throughout, vent assisted, overbreathing the vent, even Cardiovascular: Bradycardic with regular rhythm, s1s2, no M/R/G Abdomen: Obese, soft, nontender, nondistended, no guarding or rebound tenderness, BS+ x4 Extremities: No deformities, nonpitting edema BLE Neuro: Awakens easily, withdraws from noxious stimuli, inconsistently following commands commands, pupils PERRLA (brisk 2 mm bilaterally) GU: Foley catheter in place Skin: Deep tissue injury to sacrum (present on admission) and moisture associated damage to perineal area, stasis changes bilaterally lower extremities  Resolved Hospital Problem list     Assessment & Plan:   Acute Hypoxic Respiratory Failure secondary to suspected HCAP -Full vent support, implement lung protective strategies -Wean FiO2 & PEEP as tolerated to maintain O2 sats >92% -Follow intermittent Chest X-ray & ABG as needed -Spontaneous Breathing Trials today, tolerating -Continue VAP Bundle -PRN Bronchodilators -ABX as above -Off of benzodiazepines, on fentanyl infusion.  Shock: Multifactorial, improving hemodynamics Septic, myxedema coma -Continuous cardiac monitoring -Maintain MAP >65 -IV fluid  resuscitated -Wean vasopressors off as  tolerated -Continue midodrine -Begin weaning hydrocortisone off -Lactic acid normalized -HS Troponin negative x2 (17 ~ 14)  Severe Sepsis in the setting of UTI and Staph aureus HCAP -Monitor fever curve -Trend WBC's & Procalcitonin -Follow cultures as above -Narrow antibiotics to cefazolin  Acute Kidney Injury Mild Hypernatremia -Monitor I&O's / urinary output -Follow BMP -Ensure adequate renal perfusion -Avoid nephrotoxic agents as able -Replace electrolytes as indicated -Pharmacy following for assistance with electrolyte replacement -IV Fluids as needed  Diabetes Mellitus type II with hyperglycemia Morbid obesity PMHx of Hypothyroidism 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 Sedation needs in setting of mechanical ventilation PMHx of Bipolar disorder, Anxiety, Depression -Maintain a RASS goal of 0 to -1 -Versed discontinued -Propofol/Precedex aggravated bradycardia -discontinue -Avoid sedating medications as able -Interacting on wake up assessment today -Provide supportive care -Continue treatment of myxedema coma with Synthroid 100 mcg daily IV, decrease hydrocortisone 50 mg IV every 12  Deep Tissue Injury to sacrum & Stage II Pressure injury to Left Ischium (present on admission) -Wound care consulted, appreciate input -Turn q2h  Leukocytosis/leukemoid reaction Decreasing steroids  Best Practice (right click and "Reselect all SmartList Selections" daily)   Diet/type: NPO, begin tube feeds DVT prophylaxis: LMWH GI prophylaxis: PPI Lines: N/A Foley: Present, still needed Code Status:  full code Last date of multidisciplinary goals of care discussion [12/08/2020]  Labs   CBC: Recent Labs  Lab 12/08/20 0547  12/09/20 0508 12/10/20 0539  WBC 16.8* 30.2* 42.8*  NEUTROABS 11.4*  --   --   HGB 13.1  12.4 12.0  HCT 40.5 38.7 36.3  MCV 98.5 97.7 96.5  PLT 360 405* 423*     Basic Metabolic Panel: Recent Labs  Lab 12/08/20 0547 12/09/20 0508 12/10/20 0539  NA 146* 140 138  K 4.1 3.5 4.0  CL 112* 110 109  CO2 24 24 21*  GLUCOSE 177* 220* 294*  BUN 24* 20 19  CREATININE 1.23* 0.77 0.67  CALCIUM 8.9 7.5* 7.6*  MG  --  1.7 2.2  PHOS  --  2.3* 2.1*    GFR: Estimated Creatinine Clearance: 76.2 mL/min (by C-G formula based on SCr of 0.67 mg/dL). Recent Labs  Lab 12/08/20 0547 12/08/20 0800 12/09/20 0508 12/10/20 0539  PROCALCITON 0.18  --  0.97 1.85  WBC 16.8*  --  30.2* 42.8*  LATICACIDVEN 2.2* 1.7  --   --      Liver Function Tests: Recent Labs  Lab 12/08/20 0547  AST 43*  ALT 33  ALKPHOS 77  BILITOT 0.8  PROT 6.4*  ALBUMIN 2.8*    No results for input(s): LIPASE, AMYLASE in the last 168 hours. No results for input(s): AMMONIA in the last 168 hours.  ABG    Component Value Date/Time   PHART 7.44 12/08/2020 0730   PCO2ART 32 12/08/2020 0730   PO2ART 224 (H) 12/08/2020 0730   HCO3 21.7 12/08/2020 0730   TCO2 28 10/24/2018 0933   ACIDBASEDEF 1.8 12/08/2020 0730   O2SAT 99.8 12/08/2020 0730      Coagulation Profile: Recent Labs  Lab 12/08/20 0547  INR 1.2   TSH = 101 (0.35-4.5) Free T4 = 0.46 (0.61-1.12)   Cardiac Enzymes: No results for input(s): CKTOTAL, CKMB, CKMBINDEX, TROPONINI in the last 168 hours.  HbA1C: Hemoglobin A1C  Date/Time Value Ref Range Status  06/16/2011 12:47 PM 6.2 4.2 - 6.3 % Final    Comment:    The American Diabetes Association recommends that a primary goal of therapy should be <7% and that physicians should reevaluate the treatment regimen in patients with HbA1c values consistently >8%.    Hgb A1c MFr Bld  Date/Time Value Ref Range Status  12/09/2020 05:08 AM 6.3 (H) 4.8 - 5.6 % Final    Comment:    (NOTE)          Prediabetes: 5.7 - 6.4         Diabetes: >6.4         Glycemic control for adults with diabetes: <7.0   09/16/2020 04:21 PM 6.5 (H) 4.8 - 5.6 % Final    Comment:    (NOTE) Pre diabetes:          5.7%-6.4%  Diabetes:              >6.4%  Glycemic control for   <7.0% adults with diabetes     CBG: Recent Labs  Lab 12/09/20 1934 12/09/20 2305 12/10/20 0359 12/10/20 0732 12/10/20 1114  GLUCAP 211* 101* 231* 274* 242*     Review of Systems:   Unable to assess due to intubation, sedation, and critical illness  Allergies No Known Allergies   Scheduled Meds:  chlorhexidine gluconate (MEDLINE KIT)  15 mL Mouth Rinse BID   Chlorhexidine Gluconate Cloth  6 each Topical Q0600   docusate  100 mg Per Tube BID   feeding supplement (PROSource TF)  45 mL Per Tube BID   fentaNYL (SUBLIMAZE) injection  25 mcg Intravenous Once   free water  30 mL Per Tube Q4H   heparin injection (subcutaneous)  5,000 Units Subcutaneous Q8H   hydrocortisone sod succinate (SOLU-CORTEF) inj  50 mg Intravenous Q12H   insulin aspart  0-15 Units Subcutaneous Q4H   levothyroxine  100 mcg Intravenous Q0600   mouth rinse  15 mL Mouth Rinse 10 times per day   midodrine  10 mg Per Tube TID WC   nutrition supplement (JUVEN)  1 packet Per Tube BID BM   pantoprazole (PROTONIX) IV  40 mg Intravenous QHS   polyethylene glycol  17 g Per Tube Daily   Continuous Infusions:  sodium chloride Stopped (12/09/20 2204)    ceFAZolin (ANCEF) IV     feeding supplement (VITAL AF 1.2 CAL) 1,000 mL (12/09/20 1440)   fentaNYL infusion INTRAVENOUS Stopped (12/10/20 1121)   midazolam Stopped (12/09/20 0943)   norepinephrine (LEVOPHED) Adult infusion Stopped (12/10/20 1518)   PRN Meds:.acetaminophen **OR** acetaminophen, docusate sodium, fentaNYL, midazolam, polyethylene glycol   Critical care time: 40 minutes    The patient is critically ill with multiple organ systems failure and requires high complexity decision making  for assessment and support, frequent evaluation and titration of therapies, application of advanced monitoring technologies and extensive interpretation of multiple databases. Critical Care Time devoted to patient care services described in this note is  40 minutes.  Patient was discussed during ICU multidisciplinary rounds.  Renold Don, MD Advanced Bronchoscopy PCCM [facility]    *This note was dictated using voice recognition software/Dragon.  Despite best efforts to proofread, errors can occur which can change the meaning.  Any change was purely unintentional.

## 2020-12-10 NOTE — Progress Notes (Signed)
Pt was suctioned prior to extubation for a small amount of secretions. Per Dr. Georgann Housekeeper order, she was extubated and placed on a 2 L nasal cannula. No stridor noted.

## 2020-12-10 NOTE — Progress Notes (Signed)
Daily Progress Note   Patient Name: Julie Morrow       Date: 12/10/2020 DOB: 04-05-1951  Age: 70 y.o. MRN#: [id number] Attending Physician: Tyler Pita, MD Primary Care Physician: Nona Dell, Corene Cornea, MD Admit Date: 12/08/2020  Reason for Consultation/Follow-up: Establishing goals of care  Subjective: Patient is resting in bed, intubated with eyes open. No family at bedside.  Nursing is at bedside. SBT is in progress which she has done well with. Plans to extubate patient today.  Hopefully patient will soon be able to discuss [place] and choice of surrogate decision maker. Attempted to call son Thurmond Butts unsuccessfully.  Length of Stay: 2  Current Medications: Scheduled Meds:   chlorhexidine gluconate (MEDLINE KIT)  15 mL Mouth Rinse BID   Chlorhexidine Gluconate Cloth  6 each Topical Q0600   docusate  100 mg Per Tube BID   feeding supplement (PROSource TF)  45 mL Per Tube BID   fentaNYL (SUBLIMAZE) injection  25 mcg Intravenous Once   free water  30 mL Per Tube Q4H   heparin injection (subcutaneous)  5,000 Units Subcutaneous Q8H   hydrocortisone sod succinate (SOLU-CORTEF) inj  50 mg Intravenous Q12H   insulin aspart  0-15 Units Subcutaneous Q4H   levothyroxine  100 mcg Intravenous Q0600   mouth rinse  15 mL Mouth Rinse 10 times per day   midodrine  10 mg Per Tube TID WC   nutrition supplement (JUVEN)  1 packet Per Tube BID BM   pantoprazole (PROTONIX) IV  40 mg Intravenous QHS   polyethylene glycol  17 g Per Tube Daily    Continuous Infusions:  sodium chloride Stopped (12/09/20 2204)   ceFEPime (MAXIPIME) IV Stopped (12/10/20 0616)   DOPamine     feeding supplement (VITAL AF 1.2 CAL) 1,000 mL (12/09/20 1440)   fentaNYL infusion INTRAVENOUS 100 mcg/hr (12/10/20 1057)   midazolam  Stopped (12/09/20 0943)   norepinephrine (LEVOPHED) Adult infusion 20 mcg/min (12/10/20 1057)   vancomycin Stopped (12/10/20 1005)    PRN Meds: acetaminophen **OR** acetaminophen, docusate sodium, fentaNYL, midazolam, polyethylene glycol  Physical Exam Constitutional:      Comments: Eyes open. On ventilator.             Vital Signs: BP 124/67   Pulse 77   Temp 99.14 F (37.3 C)  Resp (!) 21   Ht _0  (1.6 m) Comment: from previous admission data  Wt 103.2 kg   SpO2 99%   BMI 40.30 kg/m  SpO2: SpO2: 99 % O2 Device: O2 Device: Ventilator O2 Flow Rate: O2 Flow Rate (L/min): 55 L/min  Intake/output summary:  Intake/Output Summary (Last 24 hours) at 12/10/2020 1225 Last data filed at 12/10/2020 0901 Gross per 24 hour  Intake 2559.28 ml  Output 495 ml  Net 2064.28 ml   LBM: Last BM Date: 12/08/20 Baseline Weight: Weight: 101.9 kg Most recent weight: Weight: 103.2 kg      Patient Active Problem List   Diagnosis Date Noted   Acute respiratory failure ([place]) 12/08/2020   Pressure injury of skin 12/08/2020   Hypokalemia 11/28/2020   Coffee ground emesis 11/25/2020   Acute respiratory failure with hypoxia ([place]) 11/25/2020   Aspiration pneumonia ([place]) 11/25/2020   FTT (failure to thrive) in adult 10/08/2020   Depression 10/07/2020   Thyroid disease 10/07/2020   Hyperlipidemia 10/07/2020   Failure to thrive in adult 10/07/2020   Acute encephalopathy 10/07/2020   Major neurocognitive disorder ([place]) 09/27/2020   Diabetes ([place]) 09/23/2020   Bipolar disorder, mixed ([place]) 09/19/2020   Adjustment disorder with anxiety 09/13/2020   Family discord 12/05/2019   AMS (altered mental status) 05/14/2019   Acute lower UTI 05/14/2019   Adjustment disorder with mixed disturbance of emotions and conduct    Bipolar I disorder with mania ([place]) 12/27/2018   PAD (peripheral artery disease) ([place]) 10/16/2018   GAD (generalized anxiety disorder) 03/04/2018   Insomnia 03/04/2018    Situational depression 03/04/2018   Bipolar depression ([place]) 03/04/2018   Acute kidney injury ([place]) 05/30/2017   Bradycardia    Lithium toxicity    COPD GOLD 0 / still smoking  12/22/2016   Generalized weakness 10/27/2015   GERD (gastroesophageal reflux disease) 10/15/2015   Bipolar 1 disorder, mixed, severe ([place]) 10/11/2015   Diabetes type 2, controlled ([place]) 07/07/2015   Morbid (severe) obesity due to excess calories ([place]) 07/07/2015   Cigarette smoker 07/07/2015   Hypothyroidism 07/07/2015   HLD (hyperlipidemia) 11/07/2014   Psoriasis 11/07/2014    Palliative Care Assessment & Plan     Recommendations/Plan: Patient to be extubated today per CCM. Hopefully patient will soon be able to discuss [place] and choice of HPOA. Attempted unsuccessfully to reach son Thurmond Butts.  PMT will follow along.     Code Status:    Code Status Orders  (From admission, onward)           Start     Ordered   12/08/20 0657  Full code  Continuous        12/08/20 0658           Code Status History     Date Active Date Inactive Code Status Order ID Comments User Context   11/25/2020 0355 12/02/2020 2308 Full Code [id number]  Athena Masse, MD ED   10/07/2020 1511 11/07/2020 1824 Full Code [id number]  Harold Hedge, MD ED   09/19/2020 2254 10/07/2020 1251 Full Code [id number]  Clapacs, Madie Reno, MD Inpatient   09/16/2020 1702 09/19/2020 2210 Full Code [id number]  Naaman Plummer, MD ED   12/03/2019 2236 12/05/2019 2009 Full Code [id number]  Rubye Oaks, Britni A, PA-C ED   05/14/2019 0643 05/22/2019 1810 Full Code [id number]  Jani Gravel, MD ED   01/14/2019 2253 01/15/2019 1824 Full Code [id number]  Daleen Bo, MD ED   12/27/2018 1911 01/05/2019 1656 Full Code  [id number]  Suella Broad, [place] Inpatient   10/24/2018 1028 10/25/2018 1535 Full Code [id number]  Sherwood Gambler, MD ED   05/30/2017 1355 05/31/2017 2001 Full Code [id number]  Radene Gunning, NP ED   10/27/2015 1704 10/31/2015 2133 Full Code [id number]  Vaughan Basta, MD Inpatient   10/11/2015 2154 10/22/2015 1746 Full Code [id number]  Rainey Pines, MD Inpatient   07/01/2015 1931 07/08/2015 1856 Full Code [id number]  Hildred Priest, MD Inpatient   06/30/2015 2010 07/01/2015 1931 Full Code [id number]  Lacretia Leigh, MD ED      Care plan was discussed with RN  Thank you for allowing the Palliative Medicine Team to assist in the care of this patient.       Total Time 15 min Prolonged Time Billed  no       Greater than 50%  of this time was spent counseling and coordinating care related to the above assessment and plan.  Asencion Gowda, NP  Please contact Palliative Medicine Team phone at [phone] for questions and concerns.

## 2020-12-11 ENCOUNTER — Inpatient Hospital Stay: Payer: Medicare Other

## 2020-12-11 DIAGNOSIS — Z7189 Other specified counseling: Secondary | ICD-10-CM | POA: Diagnosis not present

## 2020-12-11 DIAGNOSIS — J9601 Acute respiratory failure with hypoxia: Secondary | ICD-10-CM | POA: Diagnosis not present

## 2020-12-11 LAB — GLUCOSE, CAPILLARY
Glucose-Capillary: 137 mg/dL — ABNORMAL HIGH (ref 70–99)
Glucose-Capillary: 122 mg/dL — ABNORMAL HIGH (ref 70–99)
Glucose-Capillary: 134 mg/dL — ABNORMAL HIGH (ref 70–99)
Glucose-Capillary: 198 mg/dL — ABNORMAL HIGH (ref 70–99)
Glucose-Capillary: 130 mg/dL — ABNORMAL HIGH (ref 70–99)
Glucose-Capillary: 91 mg/dL (ref 70–99)

## 2020-12-11 LAB — CBC
HCT: 32.9 % — ABNORMAL LOW (ref 36.0–46.0)
Hemoglobin: 10.7 g/dL — ABNORMAL LOW (ref 12.0–15.0)
MCH: 30.4 pg (ref 26.0–34.0)
MCHC: 32.5 g/dL (ref 30.0–36.0)
MCV: 93.5 fL (ref 80.0–100.0)
Platelets: 314 10*3/uL (ref 150–400)
RBC: 3.52 MIL/uL — ABNORMAL LOW (ref 3.87–5.11)
RDW: 15.6 % — ABNORMAL HIGH (ref 11.5–15.5)
WBC: 26.9 10*3/uL — ABNORMAL HIGH (ref 4.0–10.5)
nRBC: 0 % (ref 0.0–0.2)

## 2020-12-11 LAB — T4, FREE: Free T4: 1.22 ng/dL — ABNORMAL HIGH (ref 0.61–1.12)

## 2020-12-11 LAB — MAGNESIUM: Magnesium: 2 mg/dL (ref 1.7–2.4)

## 2020-12-11 LAB — BASIC METABOLIC PANEL
Anion gap: 6 (ref 5–15)
BUN: 22 mg/dL (ref 8–23)
CO2: 24 mmol/L (ref 22–32)
Calcium: 7.9 mg/dL — ABNORMAL LOW (ref 8.9–10.3)
Chloride: 112 mmol/L — ABNORMAL HIGH (ref 98–111)
Creatinine, Ser: 0.66 mg/dL (ref 0.44–1.00)
GFR, Estimated: >60 mL/min (ref >60)
Glucose, Bld: 150 mg/dL — ABNORMAL HIGH (ref 70–99)
Potassium: 3 mmol/L — ABNORMAL LOW (ref 3.5–5.1)
Sodium: 142 mmol/L (ref 135–145)

## 2020-12-11 LAB — TSH: TSH: 27.921 u[IU]/mL — ABNORMAL HIGH (ref 0.350–4.500)

## 2020-12-11 LAB — PHOSPHORUS: Phosphorus: 1.3 mg/dL — ABNORMAL LOW (ref 2.5–4.6)

## 2020-12-11 MED ORDER — POTASSIUM PHOSPHATES 15 MMOLE/5ML IV SOLN
30.0000 mmol | Freq: Once | INTRAVENOUS | Status: AC
  Administered 2020-12-11: 30 mmol via INTRAVENOUS
  Filled 2020-12-11: qty 10

## 2020-12-11 MED ORDER — DOCUSATE SODIUM 100 MG PO CAPS
100.0000 mg | ORAL_CAPSULE | Freq: Two times a day (BID) | ORAL | Status: DC
  Administered 2020-12-12 – 2020-12-13 (×2): 100 mg via ORAL
  Filled 2020-12-11 (×2): qty 1

## 2020-12-11 MED ORDER — MIDODRINE HCL 5 MG PO TABS
10.0000 mg | ORAL_TABLET | Freq: Three times a day (TID) | ORAL | Status: DC
  Administered 2020-12-12 – 2020-12-15 (×10): 10 mg via ORAL
  Filled 2020-12-11 (×11): qty 2

## 2020-12-11 MED ORDER — POLYETHYLENE GLYCOL 3350 17 G PO PACK
17.0000 g | PACK | Freq: Every day | ORAL | Status: DC
  Administered 2020-12-13 – 2020-12-14 (×2): 17 g via ORAL
  Filled 2020-12-11 (×2): qty 1

## 2020-12-11 MED ORDER — HYDROCORTISONE NA SUCCINATE PF 100 MG IJ SOLR
50.0000 mg | INTRAMUSCULAR | Status: DC
  Administered 2020-12-12: 50 mg via INTRAVENOUS
  Filled 2020-12-11: qty 2

## 2020-12-11 NOTE — Progress Notes (Signed)
Nutrition Follow Up Note   DOCUMENTATION CODES:   Obesity unspecified  INTERVENTION:   RD will add supplements once diet is advanced and pending SLP recommendations  Pt at high refeed risk; recommend monitor potassium, magnesium and phosphorus labs daily until stable  NUTRITION DIAGNOSIS:   Inadequate oral intake related to inability to eat (pt sedated and ventilated) as evidenced by NPO status.  GOAL:   Patient will meet greater than or equal to 90% of their needs -previously met with tube feeds   MONITOR:   Diet advancement, Labs, Weight trends, Skin, I & O's  ASSESSMENT:   70 y.o. female with medical history significant for diabetes, bipolar mood disorder, chronic encephalopathy/dementia, class III obesity, hypothyroidism and recent admission coffee-ground emesis and aspiration PNA who is now admitted with AMS, sepsis, UTI and HCAP requiring intubation and ventilation  Pt extubated 7/27. Pt with AMS today; CT today reports no acute intracranial abnormality. SLP evaluation pending. RD will add supplements once pt's diet is advanced. Pt is currently refeeding; electrolytes being monitored and replaced by pharmacy. Per chart, pt is weight stable since admit.   Medications reviewed and include: colace, heparin, solu-cortef, insulin, synthroid, protonix, miralax, thiamine, cefazolin, Kphos   Labs reviewed: K 3.0(L), P 1.3(L), Mg 2.0 wnl Wbc- 26.9(H), Hgb 10.7(L), Hct 32.9(L) Cbgs- 137, 122 x 24 hrs  Diet Order:   Diet Order             Diet NPO time specified  Diet effective now                  EDUCATION NEEDS:   No education needs have been identified at this time  Skin:  Skin Assessment: Reviewed RN Assessment (Left ischium: 0.5 cm x 0.5 cm x0.1 cm, Sacrum: 4 cm x 4 cm intact maroon nonblanchable tissue, Left upper buttocks: 2 cm x 4 cm nonblanchable erythema, Scattered MASD To perineal skin)  Last BM:  7/28- type 7  Height:   Ht Readings from Last 1  Encounters:  12/08/20 $RemoveB'5\' 3"'zBSKifNo$  (1.6 m)    Weight:   Wt Readings from Last 1 Encounters:  12/11/20 103.7 kg    Ideal Body Weight:  52.2 kg  BMI:  Body mass index is 40.5 kg/m.  Estimated Nutritional Needs:   Kcal:  1900-2200kcal/day  Protein:  95-110g/day  Fluid:  1.3-1.6L/day  Koleen Distance MS, RD, LDN Please refer to [facility] for RD and/or RD on-call/weekend/after hours pager

## 2020-12-11 NOTE — Consult Note (Signed)
PHARMACY CONSULT NOTE  Pharmacy Consult for Electrolyte Monitoring and Replacement   Recent Labs: Potassium (mmol/L)  Date Value  12/11/2020 3.0 (L)  10/30/2012 3.8   Magnesium (mg/dL)  Date Value  48/54/6270 2.0   Calcium (mg/dL)  Date Value  35/00/9381 7.9 (L)   Calcium, Total (mg/dL)  Date Value  82/99/3716 9.2   Albumin (g/dL)  Date Value  96/78/9381 2.8 (L)  10/30/2012 3.6   Phosphorus (mg/dL)  Date Value  01/75/1025 1.3 (L)   Sodium (mmol/L)  Date Value  12/11/2020 142  10/30/2012 138   Assessment: Patient is a 70 y/o F with medical history including dementia, bipolar disorder, depression / anxiety, diabetes, hypothyroidism who is admitted with respiratory failure secondary to pneumonia, myxedema coma. Pharmacy consulted to assist with electrolyte monitoring and replacement as indicated.  Patient was extubated 7/27. Remains NPO. Un-clear if she has passed her swallow evaluation.  Goal of Therapy:  Electrolytes within normal limits  Plan:  --K 3, Phos 1.3. Will give IV potassium phosphate 30 mmol x 1 (contains 45 mEq K+) --Follow-up electrolytes with AM labs tomorrow  Tressie Ellis 12/11/2020 9:28 AM

## 2020-12-11 NOTE — Progress Notes (Signed)
Daily Progress Note   Patient Name: Julie Morrow       Date: 12/11/2020 DOB: June 14, 1950  Age: 70 y.o. MRN#: [id number] Attending Physician: Erin Fulling, MD Primary Care Physician: Crist Fat, MD Admit Date: 12/08/2020  Reason for Consultation/Follow-up: Establishing goals of care  Subjective: Patient is resting in bed. She has a congested but strong cough. She denies complaint at this time. She knows she is in the hospital and that she has been very sick. During conversation becomes increasingly confused. She tells me she is divorced and has 3 children. She states all 3 are still living; sister advised previously that one of her children died around 3 years ago. Patient states she lives alone. She states she has 4 siblings and later said [phone]...[phone]. She then began talking about shucking corn. Will reattempt GOC conversation with her at another time.   Length of Stay: 3  Current Medications: Scheduled Meds:   Chlorhexidine Gluconate Cloth  6 each Topical Q0600   docusate  100 mg Per Tube BID   heparin injection (subcutaneous)  5,000 Units Subcutaneous Q8H   [START ON 12/12/2020] hydrocortisone sod succinate (SOLU-CORTEF) inj  50 mg Intravenous Q24H   insulin aspart  0-15 Units Subcutaneous Q4H   levothyroxine  100 mcg Intravenous Q0600   midodrine  10 mg Per Tube TID WC   pantoprazole (PROTONIX) IV  40 mg Intravenous QHS   polyethylene glycol  17 g Per Tube Daily   thiamine injection  100 mg Intravenous Daily    Continuous Infusions:  sodium chloride Stopped (12/09/20 2204)    ceFAZolin (ANCEF) IV Stopped (12/11/20 8469)   potassium PHOSPHATE IVPB (in mmol) 30 mmol (12/11/20 1022)    PRN Meds: acetaminophen **OR** acetaminophen, docusate sodium, polyethylene glycol  Physical  Exam Pulmonary:     Effort: Pulmonary effort is normal.  Neurological:     Mental Status: She is alert.            Vital Signs: BP (!) 132/38   Pulse 85   Temp 98.6 F (37 C) (Axillary)   Resp (!) 27   Ht 5\' 3"  (1.6 m) Comment: from previous admission data  Wt 103.7 kg   SpO2 92%   BMI 40.50 kg/m  SpO2: SpO2: 92 % O2 Device: O2 Device: Nasal Cannula O2 Flow  Rate: O2 Flow Rate (L/min): 2 L/min  Intake/output summary:  Intake/Output Summary (Last 24 hours) at 12/11/2020 1219 Last data filed at 12/11/2020 4536 Gross per 24 hour  Intake 352.45 ml  Output 1515 ml  Net -1162.55 ml   LBM: Last BM Date: 12/11/20 Baseline Weight: Weight: 101.9 kg Most recent weight: Weight: 103.7 kg        Patient Active Problem List   Diagnosis Date Noted   Acute respiratory failure (HCC) 12/08/2020   Pressure injury of skin 12/08/2020   Hypokalemia 11/28/2020   Coffee ground emesis 11/25/2020   Acute respiratory failure with hypoxia (HCC) 11/25/2020   Aspiration pneumonia (HCC) 11/25/2020   FTT (failure to thrive) in adult 10/08/2020   Depression 10/07/2020   Thyroid disease 10/07/2020   Hyperlipidemia 10/07/2020   Failure to thrive in adult 10/07/2020   Acute encephalopathy 10/07/2020   Major neurocognitive disorder (HCC) 09/27/2020   Diabetes (HCC) 09/23/2020   Bipolar disorder, mixed (HCC) 09/19/2020   Adjustment disorder with anxiety 09/13/2020   Family discord 12/05/2019   AMS (altered mental status) 05/14/2019   Acute lower UTI 05/14/2019   Adjustment disorder with mixed disturbance of emotions and conduct    Bipolar I disorder with mania (HCC) 12/27/2018   PAD (peripheral artery disease) (HCC) 10/16/2018   GAD (generalized anxiety disorder) 03/04/2018   Insomnia 03/04/2018   Situational depression 03/04/2018   Bipolar depression (HCC) 03/04/2018   Acute kidney injury (HCC) 05/30/2017   Bradycardia    Lithium toxicity    COPD GOLD 0 / still smoking  12/22/2016    Generalized weakness 10/27/2015   GERD (gastroesophageal reflux disease) 10/15/2015   Bipolar 1 disorder, mixed, severe (HCC) 10/11/2015   Diabetes type 2, controlled (HCC) 07/07/2015   Morbid (severe) obesity due to excess calories (HCC) 07/07/2015   Cigarette smoker 07/07/2015   Hypothyroidism 07/07/2015   HLD (hyperlipidemia) 11/07/2014   Psoriasis 11/07/2014    Palliative Care Assessment & Plan     Recommendations/Plan: Patient is extubated. She is confused. Hopeful her mental status will improve to be able to have a GOC conversation directly with her.    Code Status:    Code Status Orders  (From admission, onward)           Start     Ordered   12/08/20 0657  Full code  Continuous        12/08/20 0658           Code Status History     Date Active Date Inactive Code Status Order ID Comments User Context   11/25/2020 0355 12/02/2020 2308 Full Code [id number]  Andris Baumann, MD ED   10/07/2020 1511 11/07/2020 1824 Full Code [id number]  Jae Dire, MD ED   09/19/2020 2254 10/07/2020 1251 Full Code [id number]  Clapacs, Jackquline Denmark, MD Inpatient   09/16/2020 1702 09/19/2020 2210 Full Code [id number]  Merwyn Katos, MD ED   12/03/2019 2236 12/05/2019 2009 Full Code [id number]  Henderly, Britni A, PA-C ED   05/14/2019 0643 05/22/2019 1810 Full Code [id number]  Pearson Grippe, MD ED   01/14/2019 2253 01/15/2019 1824 Full Code [id number]  Mancel Bale, MD ED   12/27/2018 1911 01/05/2019 1656 Full Code [id number]  Maryagnes Amos, FNP Inpatient   10/24/2018 1028 10/25/2018 1535 Full Code [id number]  Pricilla Loveless, MD ED   05/30/2017 1355 05/31/2017 2001 Full Code [id number]  Gwenyth Bender, NP ED   10/27/2015 1704 10/31/2015 2133 Full Code [id number]  Elisabeth Pigeon,  Heath Gold, MD Inpatient   10/11/2015 2154 10/22/2015 1746 Full Code [id number]  Brandy Hale, MD Inpatient   07/01/2015 1931 07/08/2015 1856 Full Code [id number]  Jimmy Footman, MD Inpatient   06/30/2015 2010 07/01/2015 1931 Full  Code [id number]  Lorre Nick, MD ED       Thank you for allowing the Palliative Medicine Team to assist in the care of this patient.       Total Time 15 min Prolonged Time Billed  no       Greater than 50%  of this time was spent counseling and coordinating care related to the above assessment and plan.  Morton Stall, NP  Please contact Palliative Medicine Team phone at [phone] for questions and concerns.

## 2020-12-11 NOTE — Evaluation (Signed)
Occupational Therapy Evaluation Patient Details Name: Julie Morrow MRN: [id number] DOB: November 16, 1950 Today's Date: 12/11/2020    History of Present Illness Julie Morrow is a 70 y/o that presented to the ED for AMS, repiratory distress. Workup showed septic shock, acute respiratory failure, acute metabolic encephalopathy in sitting of UTI and PNA as well as  myxedema coma. Intubated in ED, extubated 7/27. F PMH of medical history including dementia, bipolar disorder, depression / anxiety, diabetes, hypothyroidism   Clinical Impression   Nurse clears Julie Morrow for participation in OT services. Julie Morrow presents today with weakness, disorientation, limited endurance. Julie Morrow is oriented to self only, unable to provide information about PLOF, prior living situation (she reports that she lives alone in a house), family history. Inconsistently able to follow basic, 1-step directions. Speech is garbled. Julie Morrow displays weakness in all 4 extremities, greater on L side than R. Engaged Julie Morrow in UE exercises, requiring ongoing verbal and tactile cues and, often, PROM. Julie Morrow able to reach UE contralaterally across chest inconsistently. Shoulder flexion limited to ~ 70 degrees. Max/Total A for bed mobility. Julie Morrow is now on room air, with O2 sats mostly remaining in low 90s, occassional dropping into mid 80s. BP and HR WFL throughout session. At present, Julie Morrow does not appear to have ability to perform any BADL without Max A. Recommend continued OT while hospitalized, with DC to SNF.    Follow Up Recommendations  SNF    Equipment Recommendations       Recommendations for Other Services       Precautions / Restrictions Precautions Precautions: Fall Precaution Comments: L IJ cath Restrictions Weight Bearing Restrictions: No      Mobility Bed Mobility Overal bed mobility: Needs Assistance Bed Mobility: Rolling Rolling: Total assist   Supine to sit: Max assist;+2 for physical assistance Sit to supine: Max assist;+2  for physical assistance   General bed mobility comments: Julie Morrow unable to assist with rolling in bed    Transfers                 General transfer comment: Deferred d/t safety concerns    Balance Overall balance assessment: Needs assistance Sitting-balance support: Bilateral upper extremity supported;Feet supported Sitting balance-Leahy Scale: Zero Sitting balance - Comments: unable to come into sitting with +1A Postural control: Posterior lean   Standing balance-Leahy Scale: Zero                             ADL either performed or assessed with clinical judgement   ADL Overall ADL's : Needs assistance/impaired     Grooming: Maximal assistance;Bed level Grooming Details (indicate cue type and reason): Hand of hand, Max A required for using washcloth on face. Unable to follow instructions.                                     Vision Patient Visual Report: No change from baseline       Perception     Praxis      Pertinent Vitals/Pain Pain Assessment: Faces Pain Score: 0-No pain Faces Pain Scale: Hurts little more Pain Location: intermittently with R leg movement     Hand Dominance Right   Extremity/Trunk Assessment Upper Extremity Assessment Upper Extremity Assessment: Generalized weakness (Left weaker than R)   Lower Extremity Assessment Lower Extremity Assessment: Generalized weakness (L weaker than R)  Communication Communication Communication: Other (comment) (garbled, repetitive speech)   Cognition Arousal/Alertness: Awake/alert Behavior During Therapy: Flat affect Overall Cognitive Status: History of cognitive impairments - at baseline Area of Impairment: Attention;Orientation;Problem solving;Memory;Following commands;Awareness                 Orientation Level: Disoriented to;Place;Time;Situation Current Attention Level: Divided Memory: Decreased short-term memory Following Commands: Follows one step  commands inconsistently Safety/Judgement: Decreased awareness of deficits   Problem Solving: Slow processing;Decreased initiation;Difficulty sequencing;Requires verbal cues;Requires tactile cues General Comments: Julie Morrow able to provide her name. Not oriented to time, place, situation. Provides inaccurate info re: prior living situation, PLOF   General Comments       Exercises Other Exercises Other Exercises: Bed-level UE therex, with Julie Morrow able to put forth minimal effort   Shoulder Instructions      Home Living Family/patient expects to be discharged to:: Skilled nursing facility (LTC placement at a SNF)                                 Additional Comments: Julie Morrow unable to provide any information about her living situation PTA      Prior Functioning/Environment Level of Independence: Needs assistance  Gait / Transfers Assistance Needed: Per notes from previous year, Julie Morrow ambulates with RW ADL's / Homemaking Assistance Needed: per notes, Julie Morrow requires assistance with IADLs   Comments: Julie Morrow unable to provide any information re: PLOF.        OT Problem List: Decreased strength;Decreased activity tolerance;Impaired balance (sitting and/or standing);Decreased cognition;Decreased range of motion      OT Treatment/Interventions: Self-care/ADL training;Therapeutic exercise;Energy conservation;DME and/or AE instruction;Therapeutic activities;Patient/family education;Balance training;Cognitive remediation/compensation    OT Goals(Current goals can be found in the care plan section) Acute Rehab OT Goals Patient Stated Goal: to "get back to my house" OT Goal Formulation: With patient Time For Goal Achievement: 12/25/20 Potential to Achieve Goals: Poor ADL Goals Julie Morrow Will Perform Grooming: sitting;with min assist;with set-up Julie Morrow Will Perform Upper Body Bathing: sitting;with set-up;with min assist Julie Morrow/caregiver will Perform Home Exercise Program: Increased ROM;Increased strength;Both right  and left upper extremity;With minimal assist  OT Frequency: Min 1X/week   Barriers to D/C:            Co-evaluation              AM-PAC OT "6 Clicks" Daily Activity     Outcome Measure Help from another person eating meals?: A Lot Help from another person taking care of personal grooming?: A Lot Help from another person toileting, which includes using toliet, bedpan, or urinal?: Total Help from another person bathing (including washing, rinsing, drying)?: Total Help from another person to put on and taking off regular upper body clothing?: A Lot Help from another person to put on and taking off regular lower body clothing?: Total 6 Click Score: 9   End of Session Nurse Communication: Mobility status;Other (comment) (nurse clears Julie Morrow for participation in session)  Activity Tolerance: Patient limited by lethargy Patient left: in bed;with call bell/phone within reach;with bed alarm set  OT Visit Diagnosis: Muscle weakness (generalized) (M62.81);Other symptoms and signs involving cognitive function;Other abnormalities of gait and mobility (R26.89);Unsteadiness on feet (R26.81);History of falling (Z91.81)                Time: 2876-8115 OT Time Calculation (min): 15 min Charges:  OT General Charges $OT Visit: 1 Visit OT Evaluation $OT Eval Moderate Complexity: 1 Mod OT  Treatments $Self Care/Home Management : 8-22 mins Julie Craver, PhD, MS, OTR/L 12/11/20, 3:55 PM

## 2020-12-11 NOTE — Progress Notes (Signed)
NAME:  Julie Morrow, MRN:  [id number], DOB:  [birth year], LOS: 3 ADMISSION DATE:  12/08/2020, CONSULTATION DATE:  12/08/2020 REFERRING MD:  Dr. Charna Archer, CHIEF COMPLAINT:  Altered Mental status and Respiratory Distress  Brief Pt Description / Synopsis:  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  History of Present Illness:  Julie Morrow is a [name] with a past medical history as listed below who presented to [facility] ED on 12/08/20 from [facility] due to altered mental status and acute respiratory distress.  Pt is currently intubated and sedated, and no family currently is available, therefore history is obtained from ED and nursing notes.  Per notes, EMS was dispatched due to altered mental status and concern for worsening pneumonia.  She was reportedly started on Levaquin yesterday due to concern for RLL Pneumonia, but has continued to decline since then.  EMS found the patient somnolent, febrile with temperature 103.1, tachypnea, and hypoxic with O2 sats in the lows 80's on room air.  ED Course: Upon presentation to the ED, she remained somnolent and in respiratory distress with significant rales and rhonchi noted on exam.  Decision was made to emergently intubate. Initial Vital Signs: Temperature 103.2, RR 42, Pulse 97, BP 80/48, O2 sats 91% on 65% HFNC Imaging:  Chest X-ray>> mild pulmonary vascular congestion with left basilar opacity (atelectasis vs. PNA) and small pleural effusion Abdomina X-ray>>The enteric tube tip and side port are well below the level of the GE junction and projects over the expected location of the distal stomach. Bowel gas pattern is unremarkable. Labs: Na 146, glucose 177, BUN 24, Creatinine 1.23,AST 43, ALT 33, BNP 48.8, HS Troponin 17 ~ 14, lactic  acid 2.2 ~ 1.7, Procalcitonin 0.18, WBC 16.8 w/ Neutrophilia Urinalysis + UTI SARS-CoV-2 is negative  EKG Interpretation Date: 12/08/2020 EKG Time: 05:42 Rate: 103 Rhythm: sinus tachycardia QRS Axis:  Normal Intervals:  none ST/T Wave abnormalities: none  Narrative Interpretation: Sinus Tachycardia without acute ischemic changes  She was hypotensive and also met sepsis criteria (fever 103, HR >90, RR >20, WBC 16.8), therefore she 3.5 liter of IV fluid resuscitation along with broad spectrum antibiotics including IV Cefepime and Vancomycin.  Despite IV fluid resuscitation she remained hypotension, requiring initiation of Levophed infusion.  PCCM is consulted for further workup and treatment of Septic shock, Acute Hypoxic Respiratory Failure, and Acute Metabolic Encephalopathy in the setting of UTI and questionable Healthcare Associated Pneumonia.  Pertinent  Medical History  Dementia Bipolar Disorder Depression Anxiety Diabetes Mellitus Hypothyroidism  Micro Data:  12/08/20: SARS-CoV-2 & Influenza PCR>>negative 12/08/20: Blood culture x2>> negative 12/08/20: Urine>> E. coli 12/08/20: Tracheal aspirate>>Staph aureus, MSSA 12/08/20: Strep pneumo urinary antigen>>Negative 12/08/20: Legionella urinary antigen>> negative  Antimicrobials:  Cefepime 7/25>> 7/27 Vancomycin 7/25>> 7/27 Cefazolin 7/27>>  Significant Hospital Events: Including procedures, antibiotic start and stop dates in addition to other pertinent events   12/08/20: presented to ED with AMS and hypoxia, intubated in ED, PCCM consulted for admit 12/09/20: Episodes of bradycardia, TSH =101, medical picture consistent with myxedema coma 12/10/20: Tolerating SBT/SAT ~ extubated 12/11/20: Obtain CT Head for left sided weakness ~ negative for acute abnormality.  Speech evaluation, PT/OT.  Hemodynamically stable, transfer to Stepdown.  Interim History / Subjective:  -No acute events reported overnight -Afebrile, hemodynamically  stable, NO vasopressors, on 2L Nasal cannula -UOP  1.4 L last 24 hrs (net + 8 L since admit) -Urine culture with E. Coli and Tracheal aspirate with MSSA ~ ABX chagned to Cefazolin yesterday -WBC improved to 26.9 (42.8 yesterday) -Potassium 3.0 and phosphorus 1.3 ~ receiving K Phos replacement -TSH improved to 27.9 (previously 101), T4 improved to 1.22 (previously 0.46) -Pt is awake and alert, pleasantly confused (has baseline dementia) -On exam, Left side is weaker as compared to right, will obtain STAT CT Head ~ CT Head negative for acute intracranial abnormality  Objective   Blood pressure 120/70, pulse 91, temperature 98.7 F (37.1 C), temperature source Axillary, resp. rate (!) 30, height _0  (1.6 m), weight 103.7 kg, SpO2 96 %. CVP:  [7 mmHg-27 mmHg] 22 mmHg  Vent Mode: PSV PEEP:  [5 cmH20] 5 cmH20 Pressure Support:  [5 cmH20] 5 cmH20   Intake/Output Summary (Last 24 hours) at 12/11/2020 7096 Last data filed at 12/11/2020 2836 Gross per 24 hour  Intake 569.93 ml  Output 1515 ml  Net -945.07 ml    Filed Weights   12/09/20 0500 12/10/20 0350 12/11/20 0345  Weight: 102 kg 103.2 kg 103.7 kg    Examination: General: Acutely ill appearing female, appears older than stated age, laying in bed, on 2L [place], in NAD HENT: Atraumatic, normocephalic, neck supple, no JVD Lungs: Coarse breath sounds bilaterally, even, nonlabored, no accessory muscle use Cardiovascular: Regular rate & rhythm, s1s2, no M/R/G Abdomen: Obese, soft, nontender, nondistended, no guarding or rebound tenderness, BS+ x4 Extremities: No deformities, nonpitting edema BLE Neuro: Awake and alert, pleasantly confused (oriented to person), follows commands but LUE and LLE notably weaker than right side, pupils PERRLA (brisk 3 mm bilaterally) GU: Foley catheter in place Skin: Deep tissue injury to sacrum (present on admission) and moisture associated damage to perineal area, stasis changes bilaterally lower  extremities  Resolved Hospital Problem list   Shock AKI Hypernatremia  Assessment & Plan:   Acute Hypoxic Respiratory Failure secondary to MSSA Pneumonia & Acute metabolic Encephalopathy -Extubated 7/27 -Supplemental O2 as needed to maintain O2 sats >92% -Follow intermittent Chest X-ray & ABG as needed -PRN Bronchodilators -ABX as above -Ensure pulmonary hygiene  Shock: Multifactorial : Septic, myxedema coma>>resolved -Continuous cardiac monitoring -Maintain MAP >65 -IV fluid resuscitated -Vasopressors weaned off currently -Continue midodrine -Weaning hydrocortisone off (weaned to 50 mg daily on 7/28) -Lactic acid normalized -HS Troponin negative x2 (17 ~ 14)  Severe Sepsis in the setting of E. 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) -CBG's -SSI -Follow ICU Hypo/Hyperglycemia protocol -Switched Synthroid to IV (100 mcg) -12/11/20:  TSH improved to 27.9 (previously 101), T4 improved to 1.22 (previously 6.29)  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 PMHx of Bipolar disorder, Anxiety, Depression -Provide supportive care -Avoid sedating medications as able -Promote normal sleep/wake cycle -Continue treatment of myxedema coma with Synthroid 100 mcg daily IV, decrease hydrocortisone 50 mg IV daily -CT Head 7/28 negative for acute  intracranial abnormality -PT/OT -Speech evaluation  Deep Tissue Injury to sacrum & Stage II Pressure injury  to Left Ischium (present on admission) -Wound care consulted, appreciate input -Turn q2h    Best Practice (right click and "Reselect all SmartList Selections" daily)   Diet/type: NPO, speech eval pending DVT prophylaxis: Heparin SQ GI prophylaxis: PPI Lines: Left IJ, consider removal Foley: Present, still needed Code Status:  full code Last date of multidisciplinary goals of care discussion [12/11/2020]  Updated pt's son and daughter-in-law at bedside 12/11/20. All questions answered.  Labs   CBC: Recent Labs  Lab 12/08/20 0547 12/09/20 0508 12/10/20 0539 12/11/20 0550  WBC 16.8* 30.2* 42.8* 26.9*  NEUTROABS 11.4*  --   --   --   HGB 13.1 12.4 12.0 10.7*  HCT 40.5 38.7 36.3 32.9*  MCV 98.5 97.7 96.5 93.5  PLT 360 405* 423* 314     Basic Metabolic Panel: Recent Labs  Lab 12/08/20 0547 12/09/20 0508 12/10/20 0539 12/11/20 0550  NA 146* 140 138 142  K 4.1 3.5 4.0 3.0*  CL 112* 110 109 112*  CO2 24 24 21* 24  GLUCOSE 177* 220* 294* 150*  BUN 24* _0 CREATININE 1.23* 0.77 0.67 0.66  CALCIUM 8.9 7.5* 7.6* 7.9*  MG  --  1.7 2.2 2.0  PHOS  --  2.3* 2.1* 1.3*    GFR: Estimated Creatinine Clearance: 76.4 mL/min (by C-G formula based on SCr of 0.66 mg/dL). Recent Labs  Lab 12/08/20 0547 12/08/20 0800 12/09/20 0508 12/10/20 0539 12/11/20 0550  PROCALCITON 0.18  --  0.97 1.85  --   WBC 16.8*  --  30.2* 42.8* 26.9*  LATICACIDVEN 2.2* 1.7  --   --   --      Liver Function Tests: Recent Labs  Lab 12/08/20 0547  AST 43*  ALT 33  ALKPHOS 77  BILITOT 0.8  PROT 6.4*  ALBUMIN 2.8*    No results for input(s): LIPASE, AMYLASE in the last 168 hours. No results for input(s): AMMONIA in the last 168 hours.  ABG    Component Value Date/Time   PHART 7.44 12/08/2020 0730   PCO2ART 32 12/08/2020 0730   PO2ART 224 (H) 12/08/2020 0730   HCO3  21.7 12/08/2020 0730   TCO2 28 10/24/2018 0933   ACIDBASEDEF 1.8 12/08/2020 0730   O2SAT 99.8 12/08/2020 0730      Coagulation Profile: Recent Labs  Lab 12/08/20 0547  INR 1.2   TSH = 101 (0.35-4.5) Free T4 = 0.46 (0.61-1.12)   Cardiac Enzymes: No results for input(s): CKTOTAL, CKMB, CKMBINDEX, TROPONINI in the last 168 hours.  HbA1C: Hemoglobin A1C  Date/Time Value Ref Range Status  06/16/2011 12:47 PM 6.2 4.2 - 6.3 % Final    Comment:    The American Diabetes Association recommends that a primary goal of therapy should be <7% and that physicians should reevaluate the treatment regimen in patients with HbA1c values consistently >8%.    Hgb A1c MFr Bld  Date/Time Value Ref Range Status  12/09/2020 05:08 AM 6.3 (H) 4.8 - 5.6 % Final    Comment:    (NOTE)         Prediabetes: 5.7 - 6.4         Diabetes: >6.4         Glycemic control for adults with diabetes: <7.0   09/16/2020 04:21 PM 6.5 (H) 4.8 - 5.6 % Final    Comment:    (NOTE) Pre diabetes:          5.7%-6.4%  Diabetes:              >  6.4%  Glycemic control for   <7.0% adults with diabetes     CBG: Recent Labs  Lab 12/10/20 1639 12/10/20 1918 12/10/20 2351 12/11/20 0316 12/11/20 0728  GLUCAP 164* 80 139* 137* 122*     Review of Systems:   Unable to assess due to altered mental status  Allergies No Known Allergies   Scheduled Meds:  Chlorhexidine Gluconate Cloth  6 each Topical Q0600   docusate  100 mg Per Tube BID   heparin injection (subcutaneous)  5,000 Units Subcutaneous Q8H   hydrocortisone sod succinate (SOLU-CORTEF) inj  50 mg Intravenous Q12H   insulin aspart  0-15 Units Subcutaneous Q4H   levothyroxine  100 mcg Intravenous Q0600   midodrine  10 mg Per Tube TID WC   nutrition supplement (JUVEN)  1 packet Per Tube BID BM   pantoprazole (PROTONIX) IV  40 mg Intravenous QHS   polyethylene glycol  17 g Per Tube Daily   thiamine injection  100 mg Intravenous Daily   Continuous  Infusions:  sodium chloride Stopped (12/09/20 2204)    ceFAZolin (ANCEF) IV Stopped (12/11/20 2119)   norepinephrine (LEVOPHED) Adult infusion Stopped (12/10/20 1518)   potassium PHOSPHATE IVPB (in mmol)     PRN Meds:.acetaminophen **OR** acetaminophen, docusate sodium, polyethylene glycol   Critical care time: 38 minutes    Darel Hong, AGACNP-BC [facility] Prefer epic messenger for cross cover needs If after hours, please call E-link

## 2020-12-11 NOTE — Evaluation (Signed)
Physical Therapy Evaluation Patient Details Name: Julie Morrow MRN: [id number] DOB: [birth year] Today's Date: 12/11/2020   History of Present Illness  Pt is a 70 y/o that presented to the ED for AMS, repiratory distress. Workup showed septic shock, acute respiratory failure, acute metabolic encephalopathy in sitting of UTI and PNA as well as  myxedema coma. Intubated in ED, extubated 7/27. F PMH of medical history including dementia, bipolar disorder, depression / anxiety, diabetes, hypothyroidism   Clinical Impression  Patient oriented to self only, denied pain but exhibited mod pain signs intermittently with RLE movement. Pt unable to provide PLOF.  The patient did  demonstrate decreased LLE movement compared to R, and needed significant cueing to attend to L side visually and for LUE/LLE movement. She was able to participate in several LE exercises, AAROM/PROM to complete. Supine <> sit with maxAx2, able to progress to sitting EOB with occasional CAG. Pt with posterior lean and minA to maintain sitting balance throughout most of sitting. Returned to supine, all needs in reach.  Overall the patient demonstrated deficits (see "PT Problem List") that impede the patient's functional abilities, safety, and mobility and would benefit from skilled PT intervention. Recommendation is SNF due to current level of assistance needed.   Of note BP monitored with position changes, WFLs.     Follow Up Recommendations SNF    Equipment Recommendations  Other (comment) (TBD at next venue of care)    Recommendations for Other Services       Precautions / Restrictions Precautions Precautions: Fall Restrictions Weight Bearing Restrictions: No      Mobility  Bed Mobility Overal bed mobility: Needs Assistance Bed Mobility: Supine to Sit;Sit to Supine     Supine to sit: Max assist;+2 for physical assistance Sit to supine: Max assist;+2 for physical assistance        Transfers                  General transfer comment: Deferred d/t safety concerns  Ambulation/Gait                Stairs            Wheelchair Mobility    Modified Rankin (Stroke Patients Only)       Balance Overall balance assessment: Needs assistance Sitting-balance support: Bilateral upper extremity supported;Feet supported Sitting balance-Leahy Scale: Poor Sitting balance - Comments: occasionally able to maintain balance with CGA, did progress with multimodal cues and bilateral UE support Postural control: Posterior lean                                   Pertinent Vitals/Pain Pain Assessment: Faces Faces Pain Scale: Hurts little more Pain Location: intermittently with R leg movement    Home Living                   Additional Comments: pt disoriented, unable to provide PLOF    Prior Function                 Hand Dominance        Extremity/Trunk Assessment   Upper Extremity Assessment Upper Extremity Assessment: Difficult to assess due to impaired cognition;Generalized weakness    Lower Extremity Assessment Lower Extremity Assessment: Generalized weakness;Difficult to assess due to impaired cognition       Communication      Cognition Arousal/Alertness: Awake/alert Behavior During Therapy: Flat affect Overall Cognitive Status: Impaired/Different  from baseline Area of Impairment: Orientation;Attention;Memory;Following commands;Safety/judgement;Awareness;Problem solving                 Orientation Level: Disoriented to;Place;Time;Situation Current Attention Level: Sustained Memory: Decreased short-term memory Following Commands: Follows one step commands inconsistently Safety/Judgement: Decreased awareness of deficits   Problem Solving: Slow processing;Decreased initiation;Difficulty sequencing;Requires verbal cues;Requires tactile cues        General Comments      Exercises     Assessment/Plan    PT Assessment  Patient needs continued PT services  PT Problem List Decreased strength;Decreased activity tolerance;Decreased balance;Decreased mobility;Decreased cognition;Decreased knowledge of use of DME;Cardiopulmonary status limiting activity;Decreased safety awareness;Decreased knowledge of precautions;Obesity       PT Treatment Interventions DME instruction;Gait training;Functional mobility training;Therapeutic activities;Therapeutic exercise;Balance training;Patient/family education    PT Goals (Current goals can be found in the Care Plan section)  Acute Rehab PT Goals PT Goal Formulation: Patient unable to participate in goal setting Time For Goal Achievement: 12/25/20 Potential to Achieve Goals: Fair    Frequency Min 2X/week   Barriers to discharge        Co-evaluation               AM-PAC PT "6 Clicks" Mobility  Outcome Measure Help needed turning from your back to your side while in a flat bed without using bedrails?: Total Help needed moving from lying on your back to sitting on the side of a flat bed without using bedrails?: Total Help needed moving to and from a bed to a chair (including a wheelchair)?: Total Help needed standing up from a chair using your arms (e.g., wheelchair or bedside chair)?: Total Help needed to walk in hospital room?: Total Help needed climbing 3-5 steps with a railing? : Total 6 Click Score: 6    End of Session   Activity Tolerance: Patient limited by fatigue;Other (comment) (limited by cognitive status)   Nurse Communication: Mobility status PT Visit Diagnosis: Other abnormalities of gait and mobility (R26.89);Muscle weakness (generalized) (M62.81);Difficulty in walking, not elsewhere classified (R26.2)    Time: 2633-3545 PT Time Calculation (min) (ACUTE ONLY): 41 min   Charges:   PT Evaluation $PT Eval Moderate Complexity: 1 Mod PT Treatments $Therapeutic Exercise: 8-22 mins $Therapeutic Activity: 8-22 mins      Olga Coaster PT,  DPT 2:40 PM,12/11/20

## 2020-12-12 DIAGNOSIS — E876 Hypokalemia: Secondary | ICD-10-CM

## 2020-12-12 DIAGNOSIS — E038 Other specified hypothyroidism: Secondary | ICD-10-CM

## 2020-12-12 DIAGNOSIS — J9601 Acute respiratory failure with hypoxia: Secondary | ICD-10-CM | POA: Diagnosis not present

## 2020-12-12 LAB — CBC
HCT: 33.3 % — ABNORMAL LOW (ref 36.0–46.0)
Hemoglobin: 11 g/dL — ABNORMAL LOW (ref 12.0–15.0)
MCH: 30.6 pg (ref 26.0–34.0)
MCHC: 33 g/dL (ref 30.0–36.0)
MCV: 92.5 fL (ref 80.0–100.0)
Platelets: 280 10*3/uL (ref 150–400)
RBC: 3.6 MIL/uL — ABNORMAL LOW (ref 3.87–5.11)
RDW: 15.8 % — ABNORMAL HIGH (ref 11.5–15.5)
WBC: 16.9 10*3/uL — ABNORMAL HIGH (ref 4.0–10.5)
nRBC: 0 % (ref 0.0–0.2)

## 2020-12-12 LAB — GLUCOSE, CAPILLARY
Glucose-Capillary: 100 mg/dL — ABNORMAL HIGH (ref 70–99)
Glucose-Capillary: 104 mg/dL — ABNORMAL HIGH (ref 70–99)
Glucose-Capillary: 127 mg/dL — ABNORMAL HIGH (ref 70–99)
Glucose-Capillary: 135 mg/dL — ABNORMAL HIGH (ref 70–99)
Glucose-Capillary: 200 mg/dL — ABNORMAL HIGH (ref 70–99)
Glucose-Capillary: 234 mg/dL — ABNORMAL HIGH (ref 70–99)

## 2020-12-12 LAB — MAGNESIUM: Magnesium: 1.9 mg/dL (ref 1.7–2.4)

## 2020-12-12 LAB — BASIC METABOLIC PANEL
Anion gap: 8 (ref 5–15)
BUN: 12 mg/dL (ref 8–23)
CO2: 24 mmol/L (ref 22–32)
Calcium: 7.7 mg/dL — ABNORMAL LOW (ref 8.9–10.3)
Chloride: 111 mmol/L (ref 98–111)
Creatinine, Ser: 0.54 mg/dL (ref 0.44–1.00)
GFR, Estimated: >60 mL/min (ref >60)
Glucose, Bld: 134 mg/dL — ABNORMAL HIGH (ref 70–99)
Potassium: 2.4 mmol/L — CL (ref 3.5–5.1)
Sodium: 143 mmol/L (ref 135–145)

## 2020-12-12 LAB — PHOSPHORUS
Phosphorus: 1.1 mg/dL — ABNORMAL LOW (ref 2.5–4.6)
Phosphorus: 3.1 mg/dL (ref 2.5–4.6)

## 2020-12-12 LAB — POTASSIUM: Potassium: 3.3 mmol/L — ABNORMAL LOW (ref 3.5–5.1)

## 2020-12-12 LAB — T3: T3, Total: 49 ng/dL — ABNORMAL LOW (ref 71–180)

## 2020-12-12 MED ORDER — POTASSIUM CHLORIDE 10 MEQ/100ML IV SOLN
10.0000 meq | INTRAVENOUS | Status: AC
Start: 2020-12-12 — End: 2020-12-12
  Administered 2020-12-12 (×2): 10 meq via INTRAVENOUS
  Filled 2020-12-12 (×2): qty 100

## 2020-12-12 MED ORDER — POTASSIUM PHOSPHATES 15 MMOLE/5ML IV SOLN
45.0000 mmol | Freq: Once | INTRAVENOUS | Status: AC
  Administered 2020-12-12: 45 mmol via INTRAVENOUS
  Filled 2020-12-12: qty 15

## 2020-12-12 MED ORDER — POTASSIUM CHLORIDE 20 MEQ PO PACK
20.0000 meq | PACK | Freq: Once | ORAL | Status: AC
  Administered 2020-12-12: 20 meq via ORAL
  Filled 2020-12-12: qty 1

## 2020-12-12 MED ORDER — ADULT MULTIVITAMIN W/MINERALS CH
1.0000 | ORAL_TABLET | Freq: Every day | ORAL | Status: DC
  Administered 2020-12-13 – 2020-12-15 (×3): 1 via ORAL
  Filled 2020-12-12 (×3): qty 1

## 2020-12-12 MED ORDER — POTASSIUM CHLORIDE 10 MEQ/100ML IV SOLN
10.0000 meq | INTRAVENOUS | Status: DC
  Filled 2020-12-12 (×3): qty 100

## 2020-12-12 MED ORDER — NEPRO/CARBSTEADY PO LIQD
237.0000 mL | Freq: Two times a day (BID) | ORAL | Status: DC
Start: 2020-12-12 — End: 2020-12-15
  Administered 2020-12-12 – 2020-12-15 (×6): 237 mL via ORAL

## 2020-12-12 MED ORDER — LEVOTHYROXINE SODIUM 50 MCG PO TABS
150.0000 ug | ORAL_TABLET | Freq: Every day | ORAL | Status: DC
  Administered 2020-12-14 – 2020-12-15 (×2): 150 ug via ORAL
  Filled 2020-12-12 (×2): qty 1

## 2020-12-12 NOTE — Progress Notes (Signed)
Attempted to call report. RN not available at present. Will return call asap.

## 2020-12-12 NOTE — Consult Note (Addendum)
PHARMACY CONSULT NOTE  Pharmacy Consult for Electrolyte Monitoring and Replacement   Recent Labs: Potassium (mmol/L)  Date Value  12/12/2020 3.3 (L)  10/30/2012 3.8   Magnesium (mg/dL)  Date Value  29/19/1660 1.9   Calcium (mg/dL)  Date Value  60/08/5995 7.7 (L)   Calcium, Total (mg/dL)  Date Value  74/14/2395 9.2   Albumin (g/dL)  Date Value  32/06/3341 2.8 (L)  10/30/2012 3.6   Phosphorus (mg/dL)  Date Value  56/86/1683 3.1   Sodium (mmol/L)  Date Value  12/12/2020 143  10/30/2012 138   Assessment: Patient is a 70 y/o F with medical history including dementia, bipolar disorder, depression / anxiety, diabetes, hypothyroidism who is admitted with respiratory failure secondary to pneumonia, myxedema coma. Pharmacy consulted to assist with electrolyte monitoring and replacement as indicated.  Patient was extubated 7/27. Remains NPO. Un-clear if she has passed her swallow evaluation.  Goal of Therapy:  Electrolytes within normal limits  Plan:  --K 2.4, Phos 1.1. Refractory to replacement. Will give IV potassium phosphate 45 mmol x 1 (contains 67.5 mEq K+) ; Additional IV Kcl 10 mEq x 2 runs per provider K 3.3 Phos 3.1  --Will order additional 20 mEq oral  K+ --Recheck electrolytes with AM labs tomorrow  Sharen Hones, PharmD, BCPS Clinical Pharmacist   12/12/2020 5:42 PM

## 2020-12-12 NOTE — Consult Note (Addendum)
PHARMACY CONSULT NOTE  Pharmacy Consult for Electrolyte Monitoring and Replacement   Recent Labs: Potassium (mmol/L)  Date Value  12/12/2020 2.4 (LL)  10/30/2012 3.8   Magnesium (mg/dL)  Date Value  69/45/0388 1.9   Calcium (mg/dL)  Date Value  82/80/0349 7.7 (L)   Calcium, Total (mg/dL)  Date Value  17/91/5056 9.2   Albumin (g/dL)  Date Value  97/94/8016 2.8 (L)  10/30/2012 3.6   Phosphorus (mg/dL)  Date Value  55/37/4827 1.1 (L)   Sodium (mmol/L)  Date Value  12/12/2020 143  10/30/2012 138   Assessment: Patient is a 70 y/o F with medical history including dementia, bipolar disorder, depression / anxiety, diabetes, hypothyroidism who is admitted with respiratory failure secondary to pneumonia, myxedema coma. Pharmacy consulted to assist with electrolyte monitoring and replacement as indicated.  Patient was extubated 7/27. Remains NPO. Un-clear if she has passed her swallow evaluation.  Goal of Therapy:  Electrolytes within normal limits  Plan:  --K 2.4, Phos 1.1. Refractory to replacement. Will give IV potassium phosphate 45 mmol x 1 (contains 67.5 mEq K+) --Additional IV Kcl 10 mEq x 2 runs per provider --Re-check potassium at 1600 today as ordered --Follow-up all other electrolytes with AM labs tomorrow  Tressie Ellis 12/12/2020 8:35 AM

## 2020-12-12 NOTE — Plan of Care (Signed)
PMT note:  Julie Morrow to see patient however staff is currently working with her. Will reattempt on Monday as PMT is not available over the weekend.

## 2020-12-12 NOTE — Progress Notes (Signed)
PROGRESS NOTE    Julie Morrow  JXB:[id number] DOB: Dec 16, 1950 DOA: 12/08/2020 PCP: Townsend Roger, MD    Assessment & Plan:   Active Problems:   Acute respiratory failure (HCC)   Pressure injury of skin  Acute hypoxic respiratory failure: secondary to MSSA pneumonia. S/p intubation/ventilation and extubation on 12/10/20. Continue on supplemental oxygen and wean as tolerated. Encourage incentive spirometry .   MSSA pneumonia: continue on IV cefazolin & bronchodilators. Encourage incentive spirometry    Possibly septic shock: likely secondary to MSSA pneumonia, myxedema coma, UTI. 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  See previous progress notes on how pt met criteria. Vassopressors were weaned off. Continue on midodrine.  Hypothyroidism: continue on levothyroxine. Myxedma coma while inpatient but this has resolved    UTI: secondary to e. coli. Continue on IV cefazolin.    AKI: resolved  Hypernatremia: resolved   Hypokalemia: KCl repleated. Repeat potassium level ordered   Hypophosphatemia: potassium phosphate ordered. Repeat phosp level ordered    Leukocytosis: likely secondary to above infections. Continue on IV abxs    DM2: likely poorly controlled. Continue on SSI w/ accuchecks  Morbid obesity: BMI 40.5. Complicates overall care and prognosis  Hx of Hypothyroidism: continue on IV synthroid. Had myxedema coma but this has resolved    Acute metabolic encephalopathy : secondary to MSSA pneumonia, myxedema coma & UTI. Re-orient prn  Left sided weakness: CT head neg for acute intracranial abnormality. Etiology unclear.  Hx of bipolar disorder & deppression: unknown typeand/or severity. Continue to hold home dose of bupropion, depakote & risperidone  Deep Tissue Injury to sacrum & Stage II Pressure injury to Left Ischium : present on admission. Wound care consulted     DVT prophylaxis: heparin  Code Status: full  Family Communication: called pt's son Thurmond Butts and no answer   Disposition Plan: depends on PT/OT recs Level of care: Stepdown  Status is: Inpatient  Remains inpatient appropriate because:Unsafe d/c plan, IV treatments appropriate due to intensity of illness or inability to take PO, and Inpatient level of care appropriate due to severity of illness  Dispo: The patient is from: Home              Anticipated d/c is to: SNF vs [facility]               Patient currently is not medically stable to d/c.   Difficult to place patient :unclear    Consultants:  ICU   Procedures: intubation,ventilation& extubation  Antimicrobials: cefazolin    Subjective: Pt c/o fatigue   Objective: Vitals:   12/12/20 0319 12/12/20 0641 12/12/20 0700 12/12/20 0800  BP:  110/66 (!) 126/56 (!) 125/56  Pulse:  79    Resp:  (!) 23 (!) 28 (!) 28  Temp:      TempSrc:      SpO2:  97%    Weight: 103.9 kg     Height:        Intake/Output Summary (Last 24 hours) at 12/12/2020 0838 Last data filed at 12/12/2020 0826 Gross per 24 hour  Intake 690.04 ml  Output 1375 ml  Net -684.96 ml   Filed Weights   12/10/20 0350 12/11/20 0345 12/12/20 0319  Weight: 103.2 kg 103.7 kg 103.9 kg    Examination:  General exam: Appears calm and comfortable  Respiratory system: diminished breath sounds b/l Cardiovascular system: S1 & S2 +. No rubs, gallops or clicks. Gastrointestinal system: Abdomen is obese, soft and nontender. Normal bowel sounds heard. Central nervous system: Alert  and awake. Moves all extremities  Psychiatry: Judgement and insight appear abnormal.Flat mood and affect    Data Reviewed: I have personally reviewed following labs and imaging studies  CBC: Recent Labs  Lab 12/08/20 0547 12/09/20 0508 12/10/20 0539 12/11/20 0550 12/12/20 0706  WBC 16.8* 30.2* 42.8* 26.9* 16.9*  NEUTROABS 11.4*  --   --   --   --   HGB 13.1 12.4 12.0 10.7* 11.0*  HCT 40.5 38.7 36.3 32.9* 33.3*  MCV 98.5 97.7 96.5 93.5 92.5  PLT 360 405* 423* 314 161   Basic Metabolic  Panel: Recent Labs  Lab 12/08/20 0547 12/09/20 0508 12/10/20 0539 12/11/20 0550 12/12/20 0706  NA 146* 140 138 142 143  K 4.1 3.5 4.0 3.0* 2.4*  CL 112* 110 109 112* 111  CO2 24 24 21* 24 24  GLUCOSE 177* 220* 294* 150* 134*  BUN 24* $Remov'20 19 22 12  'MHTqED$ CREATININE 1.23* 0.77 0.67 0.66 0.54  CALCIUM 8.9 7.5* 7.6* 7.9* 7.7*  MG  --  1.7 2.2 2.0 1.9  PHOS  --  2.3* 2.1* 1.3* 1.1*   GFR: Estimated Creatinine Clearance: 76.5 mL/min (by C-G formula based on SCr of 0.54 mg/dL). Liver Function Tests: Recent Labs  Lab 12/08/20 0547  AST 43*  ALT 33  ALKPHOS 77  BILITOT 0.8  PROT 6.4*  ALBUMIN 2.8*   No results for input(s): LIPASE, AMYLASE in the last 168 hours. No results for input(s): AMMONIA in the last 168 hours. Coagulation Profile: Recent Labs  Lab 12/08/20 0547  INR 1.2   Cardiac Enzymes: No results for input(s): CKTOTAL, CKMB, CKMBINDEX, TROPONINI in the last 168 hours. BNP (last 3 results) No results for input(s): PROBNP in the last 8760 hours. HbA1C: No results for input(s): HGBA1C in the last 72 hours. CBG: Recent Labs  Lab 12/11/20 1529 12/11/20 1916 12/11/20 2317 12/12/20 0313 12/12/20 0722  GLUCAP 198* 130* 91 104* 127*   Lipid Profile: No results for input(s): CHOL, HDL, LDLCALC, TRIG, CHOLHDL, LDLDIRECT in the last 72 hours. Thyroid Function Tests: Recent Labs    12/11/20 0550  TSH 27.921*  FREET4 1.22*   Anemia Panel: No results for input(s): VITAMINB12, FOLATE, FERRITIN, TIBC, IRON, RETICCTPCT in the last 72 hours. Sepsis Labs: Recent Labs  Lab 12/08/20 0547 12/08/20 0800 12/09/20 0508 12/10/20 0539  PROCALCITON 0.18  --  0.97 1.85  LATICACIDVEN 2.2* 1.7  --   --     Recent Results (from the past 240 hour(s))  Resp Panel by RT-PCR (Flu A&B, Covid) Nasopharyngeal Swab     Status: None   Collection Time: 12/08/20  5:48 AM   Specimen: Nasopharyngeal Swab; Nasopharyngeal(NP) swabs in vial transport medium  Result Value Ref Range Status    SARS Coronavirus 2 by RT PCR NEGATIVE NEGATIVE Final    Comment: (NOTE) SARS-CoV-2 target nucleic acids are NOT DETECTED.  The SARS-CoV-2 RNA is generally detectable in upper respiratory specimens during the acute phase of infection. The lowest concentration of SARS-CoV-2 viral copies this assay can detect is 138 copies/mL. A negative result does not preclude SARS-Cov-2 infection and should not be used as the sole basis for treatment or other patient management decisions. A negative result may occur with  improper specimen collection/handling, submission of specimen other than nasopharyngeal swab, presence of viral mutation(s) within the areas targeted by this assay, and inadequate number of viral copies(<138 copies/mL). A negative result must be combined with clinical observations, patient history, and epidemiological information. The expected result is  Negative.  Fact Sheet for Patients:  BloggerCourse.com  Fact Sheet for Healthcare Providers:  SeriousBroker.it  This test is no t yet approved or cleared by the [place] FDA and  has been authorized for detection and/or diagnosis of SARS-CoV-2 by FDA under an Emergency Use Authorization (EUA). This EUA will remain  in effect (meaning this test can be used) for the duration of the COVID-19 declaration under Section 564(b)(1) of the Act, 21 U.S.C.section 360bbb-3(b)(1), unless the authorization is terminated  or revoked sooner.       Influenza A by PCR NEGATIVE NEGATIVE Final   Influenza B by PCR NEGATIVE NEGATIVE Final    Comment: (NOTE) The Xpert Xpress SARS-CoV-2/FLU/RSV plus assay is intended as an aid in the diagnosis of influenza from Nasopharyngeal swab specimens and should not be used as a sole basis for treatment. Nasal washings and aspirates are unacceptable for Xpert Xpress SARS-CoV-2/FLU/RSV testing.  Fact Sheet for  Patients: BloggerCourse.com  Fact Sheet for Healthcare Providers: SeriousBroker.it  This test is not yet approved or cleared by the [place] FDA and has been authorized for detection and/or diagnosis of SARS-CoV-2 by FDA under an Emergency Use Authorization (EUA). This EUA will remain in effect (meaning this test can be used) for the duration of the COVID-19 declaration under Section 564(b)(1) of the Act, 21 U.S.C. section 360bbb-3(b)(1), unless the authorization is terminated or revoked.  Performed at [facility], [address] [place], [place] [address]   Blood Culture (routine x 2)     Status: None (Preliminary result)   Collection Time: 12/08/20  5:48 AM   Specimen: BLOOD  Result Value Ref Range Status   Specimen Description BLOOD LEFT [facility]  Final   Special Requests   Final    BOTTLES DRAWN AEROBIC AND ANAEROBIC Blood Culture adequate volume   Culture   Final    NO GROWTH 3 DAYS Performed at [facility], [address], [place], [place] [address]    Report Status PENDING  Incomplete  Blood Culture (routine x 2)     Status: None (Preliminary result)   Collection Time: 12/08/20  5:48 AM   Specimen: BLOOD  Result Value Ref Range Status   Specimen Description BLOOD LEFT HAND  Final   Special Requests   Final    BOTTLES DRAWN AEROBIC AND ANAEROBIC Blood Culture adequate volume   Culture   Final    NO GROWTH 3 DAYS Performed at [facility], [address], [place], [place] [address]    Report Status PENDING  Incomplete  Urine Culture     Status: Abnormal   Collection Time: 12/08/20  6:19 AM   Specimen: In/Out Cath Urine  Result Value Ref Range Status   Specimen Description   Final    IN/OUT CATH URINE Performed at [facility], [address], [place], [place] [address]    Special Requests   Final    NONE Performed at [facility], [address]  [place], [place] [address]    Culture >=100,000 COLONIES/mL ESCHERICHIA COLI (A)  Final   Report Status 12/10/2020 FINAL  Final   Organism ID, Bacteria ESCHERICHIA COLI (A)  Final      Susceptibility   Escherichia coli - MIC*    AMPICILLIN >=32 RESISTANT Resistant     CEFAZOLIN 16 SENSITIVE Sensitive     CEFEPIME <=0.12 SENSITIVE Sensitive     CEFTRIAXONE 1 SENSITIVE Sensitive     CIPROFLOXACIN >=4 RESISTANT Resistant  GENTAMICIN >=16 RESISTANT Resistant     IMIPENEM <=0.25 SENSITIVE Sensitive     NITROFURANTOIN <=16 SENSITIVE Sensitive     TRIMETH/SULFA <=20 SENSITIVE Sensitive     AMPICILLIN/SULBACTAM >=32 RESISTANT Resistant     PIP/TAZO <=4 SENSITIVE Sensitive     * >=100,000 COLONIES/mL ESCHERICHIA COLI  MRSA Next Gen by PCR, Nasal     Status: None   Collection Time: 12/08/20  9:36 AM   Specimen: Nasal Mucosa; Nasal Swab  Result Value Ref Range Status   MRSA by PCR Next Gen NOT DETECTED NOT DETECTED Final    Comment: (NOTE) The GeneXpert MRSA Assay (FDA approved for NASAL specimens only), is one component of a comprehensive MRSA colonization surveillance program. It is not intended to diagnose MRSA infection nor to guide or monitor treatment for MRSA infections. Test performance is not FDA approved in patients less than 23 years old. Performed at [facility], [address] [place], [place] [address]   Culture, Respiratory w Gram Stain     Status: None   Collection Time: 12/08/20 11:20 AM   Specimen: Tracheal Aspirate  Result Value Ref Range Status   Specimen Description   Final    TRACHEAL ASPIRATE Performed at [facility], [address], [place], [place] [address]    Special Requests   Final    NONE Performed at [facility], [address] [place], [place] [address]    Gram Stain   Final    MODERATE WBC PRESENT,BOTH PMN AND MONONUCLEAR FEW GRAM POSITIVE COCCI IN PAIRS IN CLUSTERS Performed at [facility], [place] [address], [place], [place] [address]    Culture ABUNDANT STAPHYLOCOCCUS AUREUS  Final   Report Status 12/10/2020 FINAL  Final   Organism ID, Bacteria STAPHYLOCOCCUS AUREUS  Final      Susceptibility   Staphylococcus aureus - MIC*    CIPROFLOXACIN <=0.5 SENSITIVE Sensitive     ERYTHROMYCIN <=0.25 SENSITIVE Sensitive     GENTAMICIN <=0.5 SENSITIVE Sensitive     OXACILLIN <=0.25 SENSITIVE Sensitive     TETRACYCLINE <=1 SENSITIVE Sensitive     VANCOMYCIN 1 SENSITIVE Sensitive     TRIMETH/SULFA <=10 SENSITIVE Sensitive     CLINDAMYCIN <=0.25 SENSITIVE Sensitive     RIFAMPIN <=0.5 SENSITIVE Sensitive     Inducible Clindamycin NEGATIVE Sensitive     * ABUNDANT STAPHYLOCOCCUS AUREUS         Radiology Studies: CT HEAD WO CONTRAST  Result Date: 12/11/2020 CLINICAL DATA:  70 year old female with sepsis, respiratory failure, altered mental status common cephalopathy. EXAM: CT HEAD WITHOUT CONTRAST TECHNIQUE: Contiguous axial images were obtained from the base of the skull through the vertex without intravenous contrast. COMPARISON:  Head CT 11/05/2020 and earlier. 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 Mild encephalomalacia in the right occipital pole also appears stable. No midline shift, ventriculomegaly, mass effect, evidence of mass lesion, intracranial hemorrhage or evidence of cortically based acute infarction. Stable gray-white matter differentiation throughout the brain. Vascular: Calcified atherosclerosis at the skull base. Skull: No acute osseous abnormality identified. Previous suboccipital craniectomy. Sinuses/Orbits: Visualized paranasal sinuses and mastoids are stable and well aerated. Other: No acute orbit or scalp soft tissue finding. Chronic right suboccipital postoperative changes. IMPRESSION: 1. No acute intracranial abnormality. 2. Stable chronic right cerebellar encephalomalacia underlying previous suboccipital  craniectomy. Electronically Signed   By: Genevie Ann M.D.   On: 12/11/2020 10:04        Scheduled  Meds:  Chlorhexidine Gluconate Cloth  6 each Topical Q0600   docusate sodium  100 mg Oral BID   heparin injection (subcutaneous)  5,000 Units Subcutaneous Q8H   hydrocortisone sod succinate (SOLU-CORTEF) inj  50 mg Intravenous Q24H   insulin aspart  0-15 Units Subcutaneous Q4H   levothyroxine  100 mcg Intravenous Q0600   midodrine  10 mg Oral TID WC   pantoprazole (PROTONIX) IV  40 mg Intravenous QHS   polyethylene glycol  17 g Oral Daily   thiamine injection  100 mg Intravenous Daily   Continuous Infusions:  sodium chloride Stopped (12/09/20 2204)    ceFAZolin (ANCEF) IV 2 g (12/12/20 0631)   potassium chloride     potassium PHOSPHATE IVPB (in mmol)       LOS: 4 days    Time spent: 34 mins     Wyvonnia Dusky, MD [facility] Pager [phone]  If 7PM-7AM, please contact night-coverage 12/12/2020, 8:38 AM

## 2020-12-12 NOTE — TOC Initial Note (Signed)
Transition of Care [facility]) - Initial/Assessment Note    Patient Details  Name: Julie Morrow MRN: [id number] Date of Birth: [birth year]  Transition of Care [facility] CM/SW Contact:    Julie Morrow Phone Number: [phone] 12/12/2020, 5:38 PM  Clinical Narrative:                   CSW spoke with patient's son Julie Morrow [phone].  Julie Morrow states he HCPOA along with his aunt Julie Morrow [phone].  Julie Morrow stated he will bring HCPOA paperwork next he comes to visit the patient.  CSW explained role of TOC in patient care.  CSW explained SNF placement process and possible timeline. CSW discussed the recommendations from PT/OT for skilled nursing rehab.  CSW also updated Julie Morrow (and his spouse Julie Morrow) about the patient possibly no longer having Medicare days available for SNF and going into her co-pay days.  CSW explained the difference between the too.  Julie Morrow stated he thought the patient was a [facility] for long term care but was not certain.  CSW stated the patient does nit Medicaid and there was no indication on her chart she was at [facility] for ling term care. CSW stated I would contact [facility] and inquire about patient's stay. Julie Morrow verbalized understanding.  CSW stated I would contact Julie Morrow when I had an update.       Patient Goals and CMS Choice        Expected Discharge Plan and Services                                                Prior Living Arrangements/Services                       Activities of Daily Living Home Assistive Devices/Equipment: Other (Comment) (unsure, already at SNF) ADL Screening (condition at time of admission) Patient's cognitive ability adequate to safely complete daily activities?: No Is the patient deaf or have difficulty hearing?: No Does the patient have difficulty seeing, even when wearing glasses/contacts?: No Does the patient have difficulty concentrating, remembering, or  making decisions?: Yes Patient able to express need for assistance with ADLs?: No Does the patient have difficulty dressing or bathing?: No Independently performs ADLs?: No Communication: Independent Dressing (OT): Needs assistance Is this a change from baseline?: Pre-admission baseline Grooming: Needs assistance Is this a change from baseline?: Pre-admission baseline Feeding: Needs assistance Is this a change from baseline?: Pre-admission baseline Bathing: Needs assistance Is this a change from baseline?: Pre-admission baseline Toileting: Needs assistance Is this a change from baseline?: Pre-admission baseline In/Out Bed: Needs assistance Is this a change from baseline?: Pre-admission baseline Walks in Home: Needs assistance Is this a change from baseline?: Pre-admission baseline Does the patient have difficulty walking or climbing stairs?: Yes Weakness of Legs: Both Weakness of Arms/Hands: Both  Permission Sought/Granted                  Emotional Assessment              Admission diagnosis:  Acute respiratory failure (HCC) [J96.00] Acute respiratory failure with hypoxia (HCC) [J96.01] Sepsis without acute organ dysfunction, due to unspecified organism [facility]) [A41.9] Patient Active Problem List   Diagnosis Date Noted   Acute respiratory failure (HCC) 12/08/2020   Pressure  injury of skin 12/08/2020   Hypokalemia 11/28/2020   Coffee ground emesis 11/25/2020   Acute respiratory failure with hypoxia (HCC) 11/25/2020   Aspiration pneumonia (HCC) 11/25/2020   FTT (failure to thrive) in adult 10/08/2020   Depression 10/07/2020   Thyroid disease 10/07/2020   Hyperlipidemia 10/07/2020   Failure to thrive in adult 10/07/2020   Acute encephalopathy 10/07/2020   Major neurocognitive disorder (HCC) 09/27/2020   Diabetes (HCC) 09/23/2020   Bipolar disorder, mixed (HCC) 09/19/2020   Adjustment disorder with anxiety 09/13/2020   Family discord 12/05/2019   AMS (altered  mental status) 05/14/2019   Acute lower UTI 05/14/2019   Adjustment disorder with mixed disturbance of emotions and conduct    Bipolar I disorder with mania (HCC) 12/27/2018   PAD (peripheral artery disease) (HCC) 10/16/2018   GAD (generalized anxiety disorder) 03/04/2018   Insomnia 03/04/2018   Situational depression 03/04/2018   Bipolar depression (HCC) 03/04/2018   Acute kidney injury (HCC) 05/30/2017   Bradycardia    Lithium toxicity    COPD GOLD 0 / still smoking  12/22/2016   Generalized weakness 10/27/2015   GERD (gastroesophageal reflux disease) 10/15/2015   Bipolar 1 disorder, mixed, severe (HCC) 10/11/2015   Diabetes type 2, controlled (HCC) 07/07/2015   Morbid (severe) obesity due to excess calories (HCC) 07/07/2015   Cigarette smoker 07/07/2015   Hypothyroidism 07/07/2015   HLD (hyperlipidemia) 11/07/2014   Psoriasis 11/07/2014   PCP:  Crist Fat, MD Pharmacy:   [id number] [place], [place] - [address] AT [facility] [address] [place] [address] Phone: [phone] Fax: [phone]     Social Determinants of Health (SDOH) Interventions    Readmission Risk Interventions No flowsheet data found.

## 2020-12-12 NOTE — Evaluation (Signed)
Clinical/Bedside Swallow Evaluation Patient Details  Name: Julie Morrow MRN: [id number] Date of Birth: Sep 11, 1950  Today's Date: 12/12/2020 Time: SLP Start Time (ACUTE ONLY): 0920 SLP Stop Time (ACUTE ONLY): 1020 SLP Time Calculation (min) (ACUTE ONLY): 60 min  Past Medical History:  Past Medical History:  Diagnosis Date   Anxiety    Bipolar 1 disorder (HCC)    Bradycardia    Depression    Diabetes mellitus, type II (HCC)    Patient takes Glucotrol and Januvia   Hypothyroidism    Lithium toxicity    Past Surgical History:  Past Surgical History:  Procedure Laterality Date   ABDOMINAL HYSTERECTOMY     CHOLECYSTECTOMY     ESOPHAGOGASTRODUODENOSCOPY (EGD) WITH PROPOFOL N/A 11/25/2020   Procedure: ESOPHAGOGASTRODUODENOSCOPY (EGD) WITH PROPOFOL;  Surgeon: Midge Minium, MD;  Location: [facility] ENDOSCOPY;  Service: Endoscopy;  Laterality: N/A;   HPI:  Pt is a [name] admitted with Septic shock, Acute Hypoxic Respiratory Failure, and Acute Metabolic Encephalopathy in the setting of UTI and questionable Healthcare Associated Pneumonia requiring intubation in the ED.  Past medical history includes: Diabetes, Bipolar mood disorder; chronic encephalopathy; Dementia;  class III Obesity; hypothyroidism. Pt resides at [facility]. Recent admit this month after being found covered in coffee-ground emesis w/ AMS. She was hypoxic then. EGD was performed on 7/12, showed a Mallory-Weiss tear.   Pt is currently intubated and sedated this admit, and no family currently is available, therefore history is obtained from ED and nursing notes.     Per notes, EMS was dispatched due to altered mental status and concern for worsening pneumonia.  She was reportedly started on Levaquin yesterday due to concern for RLL Pneumonia, but has continued to decline since then. EMS found the patient somnolent, febrile with temperature 103.1, tachypnea, and hypoxic with O2 sats in the lows 80's on room air.  She  remained somnolent and in respiratory distress with significant rales and rhonchi noted on exam.  Decision was made to emergently intubate.  Imaging:  Chest X-ray>> mild pulmonary vascular congestion with left basilar opacity (atelectasis vs. PNA) and small pleural effusion.  Head CT: No acute intracranial abnormality.  2. Stable chronic right cerebellar encephalomalacia underlying  previous suboccipital craniectomy.   Multiple recent visits to the ED per chart.   Assessment / Plan / Recommendation Clinical Impression  Pt appears to present w/ oropharyngeal phase dysphagia w/ oropharyngeal phase deficits and suspected Neuromuscular impact from declined Cognitive status(baseline Dementia dx as well as new illness on top of recent illness/hospitalization which impacts her overall awareness/engagement during po tasks -- this can increase risk for aspiration. Pt required MOD+ tactile/verbal/visual cues for orientation to bolus presentation, follow through w/ tasks, and self-feeding support. Pt seemed to give LESS attention to her Left side and required verbal/tactile cues to turn head LEFT to this SLP.   Pt consumed trials of single ice chips, thin and Nectar liquids via Cup, and purees requiring help to feed self. Immediate and Delayed, overt clinical s/s of aspiration noted w/ trials of thin liquids(coughing, throat clearing) -- suspect reduced insight and awareness during tasks impacted. Pt appeared to demonstrate decreased coordination w/ swallowing of thin liquids. W/ trials of Nectar liquids and purees, no cough and no decline in respiratory presentation noted during/post trials. Swallowing appeared more coordinated and timely. Oral phase bolus management and oral clearing was inconsistent secondary to oral swishing noted w/ trials given. She required min increased Time for A-P transfer and oral clearing --  unsure if related to Baseline Cognitive decline and illness. Time b/t trials given and encouragement  for pt to fully clear mouth vs holding po's orally. OM exam revealed general oral weakness but no gross unilateral weakness noted.     Recommend dysphagia level 1 diet(gravies) w/ Nectar consistency liquids; aspiration precautions; Pills Crushed in puree for safer swallowing; feeding support and supervision at meals as needed - reduce Distractions during meals. NSG updated. ST services will f/u w/ trials to upgrade diet consistency as appropriate as pt's Cognitive awareness improves. SLP Visit Diagnosis: Dysphagia, oropharyngeal phase (R13.12) (baseline Dementia)    Aspiration Risk  Mild aspiration risk;Moderate aspiration risk;Risk for inadequate nutrition/hydration    Diet Recommendation  dysphagia level 1 diet(gravies) w/ Nectar consistency liquids; aspiration precautions; feeding support and supervision at meals as needed - reduce Distractions during meals.   Medication Administration: Crushed with puree (for safer swallowing)    Other  Recommendations Recommended Consults:  (Dietician f/u) Oral Care Recommendations: Oral care BID;Oral care before and after PO;Staff/trained caregiver to provide oral care Other Recommendations: Order thickener from pharmacy;Prohibited food (jello, ice cream, thin soups);Remove water pitcher;Have oral suction available   Follow up Recommendations Skilled Nursing facility (TBD)      Frequency and Duration min 3x week  2 weeks       Prognosis Prognosis for Safe Diet Advancement: Fair Barriers to Reach Goals: Cognitive deficits;Time post onset;Severity of deficits;Behavior      Swallow Study   General Date of Onset: 12/08/20 HPI: Pt is a [name] admitted with Septic shock, Acute Hypoxic Respiratory Failure, and Acute Metabolic Encephalopathy in the setting of UTI and questionable Healthcare Associated Pneumonia requiring intubation in the ED.  Past medical history includes: Diabetes, Bipolar mood disorder; chronic encephalopathy; Dementia;  class  III Obesity; hypothyroidism. Pt resides at [facility]. Recent admit this month after being found covered in coffee-ground emesis w/ AMS. She was hypoxic then. EGD was performed on 7/12, showed a Mallory-Weiss tear.   Pt is currently intubated and sedated this admit, and no family currently is available, therefore history is obtained from ED and nursing notes.     Per notes, EMS was dispatched due to altered mental status and concern for worsening pneumonia.  She was reportedly started on Levaquin yesterday due to concern for RLL Pneumonia, but has continued to decline since then. EMS found the patient somnolent, febrile with temperature 103.1, tachypnea, and hypoxic with O2 sats in the lows 80's on room air.  She remained somnolent and in respiratory distress with significant rales and rhonchi noted on exam.  Decision was made to emergently intubate.  Imaging:  Chest X-ray>> mild pulmonary vascular congestion with left basilar opacity (atelectasis vs. PNA) and small pleural effusion.  Head CT: No acute intracranial abnormality.  2. Stable chronic right cerebellar encephalomalacia underlying  previous suboccipital craniectomy.   Multiple recent visits to the ED per chart. Type of Study: Bedside Swallow Evaluation Previous Swallow Assessment: BSE 10/08/2020; 11/27/2020 Diet Prior to this Study: NPO (currently) Temperature Spikes Noted: No (wbc 16.9 declining) Respiratory Status: Nasal cannula (2L) History of Recent Intubation: Yes Length of Intubations (days): 3 days Date extubated: 12/10/20 Behavior/Cognition: Alert;Confused;Distractible;Requires cueing;Cooperative;Pleasant mood (MOD+ cues; min Left decreased attention and head turning) Oral Cavity Assessment: Dry;Dried secretions (dried lips) Oral Care Completed by SLP: Yes Oral Cavity - Dentition: Adequate natural dentition Vision: Functional for self-feeding (grossly) Self-Feeding Abilities: Able to feed self;Needs assist;Needs set up;Total  assist (weak overall) Patient Positioning: Upright in  bed (needed full positioning) Baseline Vocal Quality: Normal;Low vocal intensity (min) Volitional Cough: Strong;Congested Volitional Swallow: Unable to elicit    Oral/Motor/Sensory Function Overall Oral Motor/Sensory Function: Generalized oral weakness   Ice Chips Ice chips: Within functional limits Presentation: Spoon (fed; 8 trials)   Thin Liquid Thin Liquid: Impaired Presentation: Cup;Self Fed (supported; 5 trials) Oral Phase Impairments: Reduced lingual movement/coordination;Poor awareness of bolus Oral Phase Functional Implications:  (anterior spillage x2) Pharyngeal  Phase Impairments: Cough - Immediate;Cough - Delayed (x2/5 trials) Other Comments: uncoordinated appearing; decreased awareness    Nectar Thick Nectar Thick Liquid: Within functional limits Presentation: Cup;Self Fed (supported; ~3 ozs) Other Comments: better coordination and awareness   Honey Thick Honey Thick Liquid: Not tested   Puree Puree: Within functional limits Presentation: Spoon;Self Fed (and fed; ~3 ozs)   Solid     Solid: Not tested         Jerilynn Som, MS, CCC-SLP Speech Language Pathologist Rehab Services [phone] [facility] 12/12/2020,5:23 PM

## 2020-12-13 DIAGNOSIS — E038 Other specified hypothyroidism: Secondary | ICD-10-CM | POA: Diagnosis not present

## 2020-12-13 DIAGNOSIS — E876 Hypokalemia: Secondary | ICD-10-CM | POA: Diagnosis not present

## 2020-12-13 LAB — BASIC METABOLIC PANEL
Anion gap: 8 (ref 5–15)
BUN: 9 mg/dL (ref 8–23)
CO2: 27 mmol/L (ref 22–32)
Calcium: 8 mg/dL — ABNORMAL LOW (ref 8.9–10.3)
Chloride: 108 mmol/L (ref 98–111)
Creatinine, Ser: 0.55 mg/dL (ref 0.44–1.00)
GFR, Estimated: >60 mL/min (ref >60)
Glucose, Bld: 130 mg/dL — ABNORMAL HIGH (ref 70–99)
Potassium: 2.9 mmol/L — ABNORMAL LOW (ref 3.5–5.1)
Sodium: 143 mmol/L (ref 135–145)

## 2020-12-13 LAB — MAGNESIUM: Magnesium: 1.8 mg/dL (ref 1.7–2.4)

## 2020-12-13 LAB — GLUCOSE, CAPILLARY
Glucose-Capillary: 135 mg/dL — ABNORMAL HIGH (ref 70–99)
Glucose-Capillary: 137 mg/dL — ABNORMAL HIGH (ref 70–99)
Glucose-Capillary: 156 mg/dL — ABNORMAL HIGH (ref 70–99)
Glucose-Capillary: 134 mg/dL — ABNORMAL HIGH (ref 70–99)
Glucose-Capillary: 116 mg/dL — ABNORMAL HIGH (ref 70–99)

## 2020-12-13 LAB — PHOSPHORUS: Phosphorus: 2.1 mg/dL — ABNORMAL LOW (ref 2.5–4.6)

## 2020-12-13 LAB — CBC
HCT: 31.6 % — ABNORMAL LOW (ref 36.0–46.0)
Hemoglobin: 10.6 g/dL — ABNORMAL LOW (ref 12.0–15.0)
MCH: 31.9 pg (ref 26.0–34.0)
MCHC: 33.5 g/dL (ref 30.0–36.0)
MCV: 95.2 fL (ref 80.0–100.0)
Platelets: 276 10*3/uL (ref 150–400)
RBC: 3.32 MIL/uL — ABNORMAL LOW (ref 3.87–5.11)
RDW: 16.1 % — ABNORMAL HIGH (ref 11.5–15.5)
WBC: 14.1 10*3/uL — ABNORMAL HIGH (ref 4.0–10.5)
nRBC: 0 % (ref 0.0–0.2)

## 2020-12-13 MED ORDER — POTASSIUM CHLORIDE CRYS ER 20 MEQ PO TBCR
40.0000 meq | EXTENDED_RELEASE_TABLET | Freq: Two times a day (BID) | ORAL | Status: AC
  Administered 2020-12-13 (×2): 40 meq via ORAL
  Filled 2020-12-13 (×2): qty 2

## 2020-12-13 MED ORDER — FENOFIBRATE 160 MG PO TABS
160.0000 mg | ORAL_TABLET | Freq: Every day | ORAL | Status: DC
  Administered 2020-12-13 – 2020-12-15 (×3): 160 mg via ORAL
  Filled 2020-12-13 (×3): qty 1

## 2020-12-13 MED ORDER — DIVALPROEX SODIUM ER 500 MG PO TB24
750.0000 mg | ORAL_TABLET | Freq: Every day | ORAL | Status: DC
  Administered 2020-12-13 – 2020-12-15 (×3): 750 mg via ORAL
  Filled 2020-12-13 (×4): qty 1

## 2020-12-13 MED ORDER — MAGNESIUM SULFATE 2 GM/50ML IV SOLN
2.0000 g | Freq: Once | INTRAVENOUS | Status: AC
  Administered 2020-12-13: 2 g via INTRAVENOUS
  Filled 2020-12-13: qty 50

## 2020-12-13 MED ORDER — ASPIRIN EC 81 MG PO TBEC
81.0000 mg | DELAYED_RELEASE_TABLET | Freq: Every day | ORAL | Status: DC
  Administered 2020-12-13 – 2020-12-15 (×3): 81 mg via ORAL
  Filled 2020-12-13 (×3): qty 1

## 2020-12-13 MED ORDER — BUPROPION HCL ER (XL) 150 MG PO TB24
300.0000 mg | ORAL_TABLET | Freq: Every day | ORAL | Status: DC
  Administered 2020-12-13 – 2020-12-15 (×3): 300 mg via ORAL
  Filled 2020-12-13 (×4): qty 2

## 2020-12-13 MED ORDER — LOSARTAN POTASSIUM 25 MG PO TABS
25.0000 mg | ORAL_TABLET | Freq: Every day | ORAL | Status: DC
  Administered 2020-12-13 – 2020-12-15 (×3): 25 mg via ORAL
  Filled 2020-12-13 (×3): qty 1

## 2020-12-13 MED ORDER — ROSUVASTATIN CALCIUM 10 MG PO TABS
40.0000 mg | ORAL_TABLET | Freq: Every day | ORAL | Status: DC
  Administered 2020-12-13 – 2020-12-15 (×3): 40 mg via ORAL
  Filled 2020-12-13 (×3): qty 4

## 2020-12-13 MED ORDER — POTASSIUM PHOSPHATES 15 MMOLE/5ML IV SOLN
30.0000 mmol | Freq: Once | INTRAVENOUS | Status: AC
  Administered 2020-12-13: 30 mmol via INTRAVENOUS
  Filled 2020-12-13: qty 10

## 2020-12-13 NOTE — Progress Notes (Signed)
PROGRESS NOTE    Julie Morrow  MRN:[id number] DOB: 06/23/1950 DOA: 12/08/2020 PCP: Van Eyk, Jason, MD    Assessment & Plan:   Active Problems:   Acute respiratory failure (HCC)   Pressure injury of skin  Acute hypoxic respiratory failure: secondary to MSSA pneumonia. S/p intubation/ventilation and extubation on 12/10/20. Weaned off of supplemental oxygen. Encourage incentive spirometry   MSSA pneumonia: continue on IV cefazolin & bronchodilators. Encourage incentive spirometry   Possibly septic shock: likely secondary to MSSA pneumonia, myxedema coma, UTI. 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  See previous progress notes on how pt met criteria. Vassopressors were weaned off. Continue on midodrine.  Hypothyroidism: continue on levothyroxine. Myxedma coma while inpatient but this has resolved    UTI: secondary to e. coli. Continue on IV cefazolin.    AKI: resolved  Hypernatremia: resolved   Hypokalemia: potassium repleated. Mg sulfate repleated as well   Hypophosphatemia: phosp was repleated   HLD: continue on statin   Leukocytosis: likely secondary to above infections. Continue on IV abxs   DM2: likely poorly controlled. Continue on SSI w/ accuchecks  Morbid obesity: BMI 40.6. Complicates overall care & prognosis   Hx of Hypothyroidism: continue on levothyroxine, back on po. Had myxedema coma but this has resolved    Acute metabolic encephalopathy: secondary to MSSA pneumonia, myxedema coma & UTI. Oriented to person, place only. Re-orient prn   Left sided weakness: CT head neg for acute intracranial abnormality. Etiology unclear.  Hx of bipolar disorder & deppression: unknown typeand/or severity. Restarted home dose of bupropion, depakote. Continue to hold home dose of risperidone   Deep Tissue Injury to sacrum & Stage II Pressure injury to Left Ischium : present on admission. Wound care consulted     DVT prophylaxis: heparin  Code Status: full  Family Communication:  called pt's son again, Ryan, still no answer Disposition Plan: likely d/c to SNF  Level of care: Progressive Cardiac  Status is: Inpatient  Remains inpatient appropriate because:Unsafe d/c plan, IV treatments appropriate due to intensity of illness or inability to take PO, and Inpatient level of care appropriate due to severity of illness  Dispo: The patient is from: Home              Anticipated d/c is to: SNF               Patient currently is not medically stable to d/c.   Difficult to place patient :unclear    Consultants:  ICU   Procedures: intubation,ventilation& extubation  Antimicrobials: cefazolin    Subjective: Pt c/o malaise.   Objective: Vitals:   12/12/20 2015 12/12/20 2311 12/13/20 0444 12/13/20 0741  BP:  136/69 (!) 118/39 134/63  Pulse:  69 69 (!) 115  Resp:  20 20 20  Temp: 97.7 F (36.5 C) 98.7 F (37.1 C) 98.8 F (37.1 C)   TempSrc: Oral Oral    SpO2:  94% 96% 94%  Weight:   104 kg   Height:        Intake/Output Summary (Last 24 hours) at 12/13/2020 0749 Last data filed at 12/13/2020 0731 Gross per 24 hour  Intake 424.51 ml  Output 1275 ml  Net -850.49 ml   Filed Weights   12/11/20 0345 12/12/20 0319 12/13/20 0444  Weight: 103.7 kg 103.9 kg 104 kg    Examination:  General exam: Appears comfortable but confused  Respiratory system: decreased breath sounds b/l  Cardiovascular system: S1/S2+. No rubs or clicks  Gastrointestinal system: Abd is   soft, NT, obese & hyperactive bowel sounds  Central nervous system: Alert and awake. Moves all extremities  Psychiatry: Judgement and insight appear abnormal. Flat mood and affect    Data Reviewed: I have personally reviewed following labs and imaging studies  CBC: Recent Labs  Lab 12/08/20 0547 12/09/20 0508 12/10/20 0539 12/11/20 0550 12/12/20 0706 12/13/20 0603  WBC 16.8* 30.2* 42.8* 26.9* 16.9* 14.1*  NEUTROABS 11.4*  --   --   --   --   --   HGB 13.1 12.4 12.0 10.7* 11.0* 10.6*   HCT 40.5 38.7 36.3 32.9* 33.3* 31.6*  MCV 98.5 97.7 96.5 93.5 92.5 95.2  PLT 360 405* 423* 314 280 456   Basic Metabolic Panel: Recent Labs  Lab 12/09/20 0508 12/10/20 0539 12/11/20 0550 12/12/20 0706 12/12/20 1637 12/13/20 0603  NA 140 138 142 143  --  143  K 3.5 4.0 3.0* 2.4* 3.3* 2.9*  CL 110 109 112* 111  --  108  CO2 24 21* 24 24  --  27  GLUCOSE 220* 294* 150* 134*  --  130*  BUN _0 --  9  CREATININE 0.77 0.67 0.66 0.54  --  0.55  CALCIUM 7.5* 7.6* 7.9* 7.7*  --  8.0*  MG 1.7 2.2 2.0 1.9  --  1.8  PHOS 2.3* 2.1* 1.3* 1.1* 3.1 2.1*   GFR: Estimated Creatinine Clearance: 76.5 mL/min (by C-G formula based on SCr of 0.55 mg/dL). Liver Function Tests: Recent Labs  Lab 12/08/20 0547  AST 43*  ALT 33  ALKPHOS 77  BILITOT 0.8  PROT 6.4*  ALBUMIN 2.8*   No results for input(s): LIPASE, AMYLASE in the last 168 hours. No results for input(s): AMMONIA in the last 168 hours. Coagulation Profile: Recent Labs  Lab 12/08/20 0547  INR 1.2   Cardiac Enzymes: No results for input(s): CKTOTAL, CKMB, CKMBINDEX, TROPONINI in the last 168 hours. BNP (last 3 results) No results for input(s): PROBNP in the last 8760 hours. HbA1C: No results for input(s): HGBA1C in the last 72 hours. CBG: Recent Labs  Lab 12/12/20 1136 12/12/20 1550 12/12/20 1922 12/12/20 2337 12/13/20 0438  GLUCAP 200* 234* 135* 100* 135*   Lipid Profile: No results for input(s): CHOL, HDL, LDLCALC, TRIG, CHOLHDL, LDLDIRECT in the last 72 hours. Thyroid Function Tests: Recent Labs    12/11/20 0550  TSH 27.921*  FREET4 1.22*   Anemia Panel: No results for input(s): VITAMINB12, FOLATE, FERRITIN, TIBC, IRON, RETICCTPCT in the last 72 hours. Sepsis Labs: Recent Labs  Lab 12/08/20 0547 12/08/20 0800 12/09/20 0508 12/10/20 0539  PROCALCITON 0.18  --  0.97 1.85  LATICACIDVEN 2.2* 1.7  --   --     Recent Results (from the past 240 hour(s))  Resp Panel by RT-PCR (Flu A&B, Covid)  Nasopharyngeal Swab     Status: None   Collection Time: 12/08/20  5:48 AM   Specimen: Nasopharyngeal Swab; Nasopharyngeal(NP) swabs in vial transport medium  Result Value Ref Range Status   SARS Coronavirus 2 by RT PCR NEGATIVE NEGATIVE Final    Comment: (NOTE) SARS-CoV-2 target nucleic acids are NOT DETECTED.  The SARS-CoV-2 RNA is generally detectable in upper respiratory specimens during the acute phase of infection. The lowest concentration of SARS-CoV-2 viral copies this assay can detect is 138 copies/mL. A negative result does not preclude SARS-Cov-2 infection and should not be used as the sole basis for treatment or other patient management decisions. A negative result may occur with  improper specimen collection/handling, submission of specimen other than nasopharyngeal swab, presence of viral mutation(s) within the areas targeted by this assay, and inadequate number of viral copies(<138 copies/mL). A negative result must be combined with clinical observations, patient history, and epidemiological information. The expected result is Negative.  Fact Sheet for Patients:  EntrepreneurPulse.com.au  Fact Sheet for Healthcare Providers:  IncredibleEmployment.be  This test is no t yet approved or cleared by the [place] FDA and  has been authorized for detection and/or diagnosis of SARS-CoV-2 by FDA under an Emergency Use Authorization (EUA). This EUA will remain  in effect (meaning this test can be used) for the duration of the COVID-19 declaration under Section 564(b)(1) of the Act, 21 U.S.C.section 360bbb-3(b)(1), unless the authorization is terminated  or revoked sooner.       Influenza A by PCR NEGATIVE NEGATIVE Final   Influenza B by PCR NEGATIVE NEGATIVE Final    Comment: (NOTE) The Xpert Xpress SARS-CoV-2/FLU/RSV plus assay is intended as an aid in the diagnosis of influenza from Nasopharyngeal swab specimens and should not be  used as a sole basis for treatment. Nasal washings and aspirates are unacceptable for Xpert Xpress SARS-CoV-2/FLU/RSV testing.  Fact Sheet for Patients: EntrepreneurPulse.com.au  Fact Sheet for Healthcare Providers: IncredibleEmployment.be  This test is not yet approved or cleared by the [place] FDA and has been authorized for detection and/or diagnosis of SARS-CoV-2 by FDA under an Emergency Use Authorization (EUA). This EUA will remain in effect (meaning this test can be used) for the duration of the COVID-19 declaration under Section 564(b)(1) of the Act, 21 U.S.C. section 360bbb-3(b)(1), unless the authorization is terminated or revoked.  Performed at [facility], [address] [place], [place] [address]   Blood Culture (routine x 2)     Status: None (Preliminary result)   Collection Time: 12/08/20  5:48 AM   Specimen: BLOOD  Result Value Ref Range Status   Specimen Description BLOOD LEFT [facility]  Final   Special Requests   Final    BOTTLES DRAWN AEROBIC AND ANAEROBIC Blood Culture adequate volume   Culture   Final    NO GROWTH 4 DAYS Performed at [facility], [address], [place], [place] [address]    Report Status PENDING  Incomplete  Blood Culture (routine x 2)     Status: None (Preliminary result)   Collection Time: 12/08/20  5:48 AM   Specimen: BLOOD  Result Value Ref Range Status   Specimen Description BLOOD LEFT HAND  Final   Special Requests   Final    BOTTLES DRAWN AEROBIC AND ANAEROBIC Blood Culture adequate volume   Culture   Final    NO GROWTH 4 DAYS Performed at [facility], [address] [place], [place] [address]    Report Status PENDING  Incomplete  Urine Culture     Status: Abnormal   Collection Time: 12/08/20  6:19 AM   Specimen: In/Out Cath Urine  Result Value Ref Range Status   Specimen Description   Final    IN/OUT CATH URINE Performed at [facility], [address], [place], [place] [address]    Special Requests   Final    NONE Performed at [facility], [address] [place], [place] [address]    Culture >=100,000 COLONIES/mL ESCHERICHIA COLI (A)  Final   Report Status 12/10/2020 FINAL  Final   Organism ID, Bacteria ESCHERICHIA COLI (A)  Final      Susceptibility  Escherichia coli - MIC*    AMPICILLIN >=32 RESISTANT Resistant     CEFAZOLIN 16 SENSITIVE Sensitive     CEFEPIME <=0.12 SENSITIVE Sensitive     CEFTRIAXONE 1 SENSITIVE Sensitive     CIPROFLOXACIN >=4 RESISTANT Resistant     GENTAMICIN >=16 RESISTANT Resistant     IMIPENEM <=0.25 SENSITIVE Sensitive     NITROFURANTOIN <=16 SENSITIVE Sensitive     TRIMETH/SULFA <=20 SENSITIVE Sensitive     AMPICILLIN/SULBACTAM >=32 RESISTANT Resistant     PIP/TAZO <=4 SENSITIVE Sensitive     * >=100,000 COLONIES/mL ESCHERICHIA COLI  MRSA Next Gen by PCR, Nasal     Status: None   Collection Time: 12/08/20  9:36 AM   Specimen: Nasal Mucosa; Nasal Swab  Result Value Ref Range Status   MRSA by PCR Next Gen NOT DETECTED NOT DETECTED Final    Comment: (NOTE) The GeneXpert MRSA Assay (FDA approved for NASAL specimens only), is one component of a comprehensive MRSA colonization surveillance program. It is not intended to diagnose MRSA infection nor to guide or monitor treatment for MRSA infections. Test performance is not FDA approved in patients less than 2 years old. Performed at [facility], [address] [place], [place] [address]   Culture, Respiratory w Gram Stain     Status: None   Collection Time: 12/08/20 11:20 AM   Specimen: Tracheal Aspirate  Result Value Ref Range Status   Specimen Description   Final    TRACHEAL ASPIRATE Performed at [facility], [address] [place], [place] [address]    Special Requests   Final    NONE Performed at [facility], [address] [place], [place] [address]    Gram Stain   Final     MODERATE WBC PRESENT,BOTH PMN AND MONONUCLEAR FEW GRAM POSITIVE COCCI IN PAIRS IN CLUSTERS Performed at [facility], [address] [place],  [address]    Culture ABUNDANT STAPHYLOCOCCUS AUREUS  Final   Report Status 12/10/2020 FINAL  Final   Organism ID, Bacteria STAPHYLOCOCCUS AUREUS  Final      Susceptibility   Staphylococcus aureus - MIC*    CIPROFLOXACIN <=0.5 SENSITIVE Sensitive     ERYTHROMYCIN <=0.25 SENSITIVE Sensitive     GENTAMICIN <=0.5 SENSITIVE Sensitive     OXACILLIN <=0.25 SENSITIVE Sensitive     TETRACYCLINE <=1 SENSITIVE Sensitive     VANCOMYCIN 1 SENSITIVE Sensitive     TRIMETH/SULFA <=10 SENSITIVE Sensitive     CLINDAMYCIN <=0.25 SENSITIVE Sensitive     RIFAMPIN <=0.5 SENSITIVE Sensitive     Inducible Clindamycin NEGATIVE Sensitive     * ABUNDANT STAPHYLOCOCCUS AUREUS         Radiology Studies: CT HEAD WO CONTRAST  Result Date: 12/11/2020 CLINICAL DATA:  69-year-old female with sepsis, respiratory failure, altered mental status common cephalopathy. EXAM: CT HEAD WITHOUT CONTRAST TECHNIQUE: Contiguous axial images were obtained from the base of the skull through the vertex without intravenous contrast. COMPARISON:  Head CT 11/05/2020 and earlier. 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 Mild encephalomalacia in the right occipital pole also appears stable. No midline shift, ventriculomegaly, mass effect, evidence of mass lesion, intracranial hemorrhage or evidence of cortically based acute infarction. Stable gray-white matter differentiation throughout the brain. Vascular: Calcified atherosclerosis at the skull base. Skull: No acute osseous abnormality identified. Previous suboccipital craniectomy. Sinuses/Orbits: Visualized paranasal sinuses and mastoids are stable and well aerated. Other: No acute orbit or scalp   soft tissue finding. Chronic right suboccipital postoperative  changes. IMPRESSION: 1. No acute intracranial abnormality. 2. Stable chronic right cerebellar encephalomalacia underlying previous suboccipital craniectomy. Electronically Signed   By: Genevie Ann M.D.   On: 12/11/2020 10:04        Scheduled Meds:  Chlorhexidine Gluconate Cloth  6 each Topical Q0600   docusate sodium  100 mg Oral BID   feeding supplement (NEPRO CARB STEADY)  237 mL Oral BID BM   heparin injection (subcutaneous)  5,000 Units Subcutaneous Q8H   insulin aspart  0-15 Units Subcutaneous Q4H   levothyroxine  150 mcg Oral Q0600   midodrine  10 mg Oral TID WC   multivitamin with minerals  1 tablet Oral Daily   pantoprazole (PROTONIX) IV  40 mg Intravenous QHS   polyethylene glycol  17 g Oral Daily   potassium chloride  40 mEq Oral BID   thiamine injection  100 mg Intravenous Daily   Continuous Infusions:  sodium chloride 10 mL/hr at 12/13/20 0731    ceFAZolin (ANCEF) IV Stopped (12/13/20 0704)   magnesium sulfate bolus IVPB     potassium PHOSPHATE IVPB (in mmol)       LOS: 5 days    Time spent: 32 mins     Wyvonnia Dusky, MD [facility] Pager [phone]  If 7PM-7AM, please contact night-coverage 12/13/2020, 7:49 AM

## 2020-12-13 NOTE — Progress Notes (Signed)
Yellow Mews temporarily; vitals rechecked, MEWS returned to green at this time.    12/13/20 0741  Assess: MEWS Score  Temp 98 F (36.7 C)  BP 134/63  Pulse Rate (!) 115  Resp 20  Level of Consciousness Alert  SpO2 94 %  O2 Device Room Air  Assess: MEWS Score  MEWS Temp 0  MEWS Systolic 0  MEWS Pulse 2  MEWS RR 0  MEWS LOC 0  MEWS Score 2  MEWS Score Color Yellow  Assess: if the MEWS score is Yellow or Red  Were vital signs taken at a resting state? Yes  Focused Assessment No change from prior assessment  Does the patient meet 2 or more of the SIRS criteria? Yes  Does the patient have a confirmed or suspected source of infection? Yes  Provider and Rapid Response Notified? No  MEWS guidelines implemented *See Row Information* No, vital signs rechecked  Treat  MEWS Interventions Other (Comment)  Assess: SIRS CRITERIA  SIRS Temperature  0  SIRS Pulse 1  SIRS Respirations  0  SIRS WBC 1  SIRS Score Sum  2

## 2020-12-13 NOTE — Consult Note (Signed)
PHARMACY CONSULT NOTE  Pharmacy Consult for Electrolyte Monitoring and Replacement   Recent Labs: Potassium (mmol/L)  Date Value  12/13/2020 2.9 (L)  10/30/2012 3.8   Magnesium (mg/dL)  Date Value  11/25/1973 1.8   Calcium (mg/dL)  Date Value  88/32/5498 8.0 (L)   Calcium, Total (mg/dL)  Date Value  26/41/5830 9.2   Albumin (g/dL)  Date Value  94/11/6806 2.8 (L)  10/30/2012 3.6   Phosphorus (mg/dL)  Date Value  81/02/3158 2.1 (L)   Sodium (mmol/L)  Date Value  12/13/2020 143  10/30/2012 138   Assessment: Patient is a 70 y/o F with medical history including dementia, bipolar disorder, depression / anxiety, diabetes, hypothyroidism who is admitted with respiratory failure secondary to pneumonia, myxedema coma. Pharmacy consulted to assist with electrolyte monitoring and replacement as indicated.  Patient was extubated 7/27. Remains NPO. Un-clear if she has passed her swallow evaluation.  Goal of Therapy:  Electrolytes within normal limits  Plan:  K 2.4>3.3>2.9, Phos 1.1>3.1>2.1. (Refractory to replacement. ) Will give IV potassium phosphate 30 mmol x 1 (contains ~44 mEq K+); Additional PO Kcl 40 mEq x 2 runs per provider Mg 2.2>2>1.8; give MgSO4 2g x1 per provider.  --Recheck electrolytes with AM labs tomorrow  Martyn Malay, PharmD, [facility] Clinical Pharmacist   12/13/2020 7:54 AM

## 2020-12-14 DIAGNOSIS — J15211 Pneumonia due to Methicillin susceptible Staphylococcus aureus: Secondary | ICD-10-CM

## 2020-12-14 DIAGNOSIS — E039 Hypothyroidism, unspecified: Secondary | ICD-10-CM

## 2020-12-14 DIAGNOSIS — N390 Urinary tract infection, site not specified: Secondary | ICD-10-CM | POA: Diagnosis not present

## 2020-12-14 LAB — BASIC METABOLIC PANEL
Anion gap: 10 (ref 5–15)
BUN: 7 mg/dL — ABNORMAL LOW (ref 8–23)
CO2: 26 mmol/L (ref 22–32)
Calcium: 7.9 mg/dL — ABNORMAL LOW (ref 8.9–10.3)
Chloride: 105 mmol/L (ref 98–111)
Creatinine, Ser: 0.49 mg/dL (ref 0.44–1.00)
GFR, Estimated: >60 mL/min (ref >60)
Glucose, Bld: 123 mg/dL — ABNORMAL HIGH (ref 70–99)
Potassium: 3.5 mmol/L (ref 3.5–5.1)
Sodium: 141 mmol/L (ref 135–145)

## 2020-12-14 LAB — PHOSPHORUS: Phosphorus: 2.4 mg/dL — ABNORMAL LOW (ref 2.5–4.6)

## 2020-12-14 LAB — GLUCOSE, CAPILLARY
Glucose-Capillary: 116 mg/dL — ABNORMAL HIGH (ref 70–99)
Glucose-Capillary: 137 mg/dL — ABNORMAL HIGH (ref 70–99)
Glucose-Capillary: 127 mg/dL — ABNORMAL HIGH (ref 70–99)
Glucose-Capillary: 167 mg/dL — ABNORMAL HIGH (ref 70–99)
Glucose-Capillary: 126 mg/dL — ABNORMAL HIGH (ref 70–99)
Glucose-Capillary: 96 mg/dL (ref 70–99)

## 2020-12-14 LAB — MAGNESIUM: Magnesium: 2.1 mg/dL (ref 1.7–2.4)

## 2020-12-14 LAB — CBC
HCT: 34.2 % — ABNORMAL LOW (ref 36.0–46.0)
Hemoglobin: 11 g/dL — ABNORMAL LOW (ref 12.0–15.0)
MCH: 30.6 pg (ref 26.0–34.0)
MCHC: 32.2 g/dL (ref 30.0–36.0)
MCV: 95 fL (ref 80.0–100.0)
Platelets: 279 10*3/uL (ref 150–400)
RBC: 3.6 MIL/uL — ABNORMAL LOW (ref 3.87–5.11)
RDW: 16.3 % — ABNORMAL HIGH (ref 11.5–15.5)
WBC: 11.2 10*3/uL — ABNORMAL HIGH (ref 4.0–10.5)
nRBC: 0.2 % (ref 0.0–0.2)

## 2020-12-14 MED ORDER — MAGNESIUM SULFATE 2 GM/50ML IV SOLN
2.0000 g | Freq: Once | INTRAVENOUS | Status: AC
  Administered 2020-12-14: 2 g via INTRAVENOUS
  Filled 2020-12-14: qty 50

## 2020-12-14 MED ORDER — POTASSIUM CHLORIDE CRYS ER 20 MEQ PO TBCR
40.0000 meq | EXTENDED_RELEASE_TABLET | Freq: Two times a day (BID) | ORAL | Status: DC
  Administered 2020-12-14: 40 meq via ORAL
  Filled 2020-12-14: qty 2

## 2020-12-14 MED ORDER — POTASSIUM PHOSPHATES 15 MMOLE/5ML IV SOLN
30.0000 mmol | Freq: Once | INTRAVENOUS | Status: AC
  Administered 2020-12-14: 30 mmol via INTRAVENOUS
  Filled 2020-12-14: qty 10

## 2020-12-14 NOTE — Progress Notes (Signed)
Patient continuously leaking around rectal tube.  Unsure if patient has urinated.  Bladder scan revealed only 159 ml in bladder.    Will continue to monitor

## 2020-12-14 NOTE — Consult Note (Addendum)
PHARMACY CONSULT NOTE  Pharmacy Consult for Electrolyte Monitoring and Replacement   Recent Labs: Potassium (mmol/L)  Date Value  12/13/2020 2.9 (L)  10/30/2012 3.8   Magnesium (mg/dL)  Date Value  92/33/0076 1.8   Calcium (mg/dL)  Date Value  22/63/3354 8.0 (L)   Calcium, Total (mg/dL)  Date Value  56/25/6389 9.2   Albumin (g/dL)  Date Value  37/34/2876 2.8 (L)  10/30/2012 3.6   Phosphorus (mg/dL)  Date Value  81/15/7262 2.1 (L)   Sodium (mmol/L)  Date Value  12/13/2020 143  10/30/2012 138   Assessment: Patient is a 70 y/o F with medical history including dementia, bipolar disorder, depression / anxiety, diabetes, hypothyroidism who is admitted with respiratory failure secondary to pneumonia, myxedema coma. Pharmacy consulted to assist with electrolyte monitoring and replacement as indicated.  Patient was extubated 7/27. Remains NPO. Un-clear if she has passed her swallow evaluation.  Goal of Therapy:  Electrolytes within normal limits  Plan:  K 3.3>2.9>3.5, Phos 2.1>2.4. (Refractory to replacement. ) Will give IV potassium phosphate 30 mmol x 1 (contains ~44 mEq K+); Additional PO Kcl 40 mEq x 1 run per provider Mg 2>1.8>2.1; give MgSO4 2g x1 per provider.  --Recheck electrolytes with AM labs tomorrow  Martyn Malay, PharmD, [facility] Clinical Pharmacist   12/14/2020 8:53 AM

## 2020-12-14 NOTE — Progress Notes (Signed)
PROGRESS NOTE    Julie Morrow  ALP:[id number] DOB: 03/25/1951 DOA: 12/08/2020 PCP: Townsend Roger, MD    Assessment & Plan:   Active Problems:   Acute respiratory failure (HCC)   Pressure injury of skin  Acute hypoxic respiratory failure: secondary to MSSA pneumonia. S/p intubation/ventilation and extubation on 12/10/20. Weaned off of supplemental oxygen. Encourage incentive spirometry   MSSA pneumonia: continue on IV cefazolin & bronchodilators. Encourage incentive spirometry    Possibly septic shock: likely secondary to MSSA pneumonia, myxedema coma, UTI. 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  See previous progress notes on how pt met criteria. Vassopressors were weaned off. Continue on midodrine.  Hypothyroidism: continue on levothyroxine. Myxedma coma while inpatient but this has resolved    UTI: secondary to e. coli. Continue on IV cefazolin    AKI: resolved  Hypernatremia: resolved   Hypokalemia: WNL   Hypophosphatemia: phosp was repleated   HLD: continue on statin   Leukocytosis: likely secondary to above infections. Continue on IV abxs   DM2: likely poorly controlled. Continue on SSI w/ accuchecks   Morbid obesity: BMI 40.6. Complicates overall care & prognosis    Acute metabolic encephalopathy: secondary to MSSA pneumonia, myxedema coma & UTI. Oriented to person, place only. Re-orient prn   Left sided weakness: CT head neg for acute intracranial abnormality. Etiology unclear.  Hx of bipolar disorder & deppression: unknown typeand/or severity. Continue on home dose of bupropion, depakote. Continue to hold home dose of risperidone   Deep Tissue Injury to sacrum & Stage II Pressure injury to Left Ischium : present on admission. Wound care consulted     DVT prophylaxis: heparin  Code Status: full  Family Communication: called pt's sister, Karen Kitchens but no answer and unable to leave a voicemail  Disposition Plan: likely d/c to SNF  Level of care: Progressive  Cardiac  Status is: Inpatient  Remains inpatient appropriate because:Unsafe d/c plan, IV treatments appropriate due to intensity of illness or inability to take PO, and Inpatient level of care appropriate due to severity of illness  Dispo: The patient is from: Home              Anticipated d/c is to: SNF               Patient currently is not medically stable to d/c.   Difficult to place patient :unclear    Consultants:  ICU   Procedures: intubation,ventilation& extubation  Antimicrobials: cefazolin    Subjective: Pt c/o fatigue.   Objective: Vitals:   12/13/20 1631 12/13/20 1919 12/14/20 0500 12/14/20 0750  BP: 130/74 119/70  (!) 118/55  Pulse: 72 65  64  Resp: $Remo'19 18  18  'GfmKO$ Temp: 98.2 F (36.8 C) 98.1 F (36.7 C)  98.2 F (36.8 C)  TempSrc: Oral Oral    SpO2: 96% 97%  100%  Weight:   103.6 kg   Height:        Intake/Output Summary (Last 24 hours) at 12/14/2020 1141 Last data filed at 12/14/2020 0446 Gross per 24 hour  Intake 460.48 ml  Output 1000 ml  Net -539.52 ml   Filed Weights   12/12/20 0319 12/13/20 0444 12/14/20 0500  Weight: 103.9 kg 104 kg 103.6 kg    Examination:  General exam: Appears comfortable but still confused Respiratory system: diminished breath sounds b/l  Cardiovascular system: S1/S2+. No rubs or clicks  Gastrointestinal system: Abd is soft, NT, ND & hypoactive bowel sounds  Central nervous system: Alert and awake. Moves  all extremities  Psychiatry: Judgement and insight appear abnormal. Flat mood and affect    Data Reviewed: I have personally reviewed following labs and imaging studies  CBC: Recent Labs  Lab 12/08/20 0547 12/09/20 0508 12/10/20 0539 12/11/20 0550 12/12/20 0706 12/13/20 0603 12/14/20 0822  WBC 16.8*   < > 42.8* 26.9* 16.9* 14.1* 11.2*  NEUTROABS 11.4*  --   --   --   --   --   --   HGB 13.1   < > 12.0 10.7* 11.0* 10.6* 11.0*  HCT 40.5   < > 36.3 32.9* 33.3* 31.6* 34.2*  MCV 98.5   < > 96.5 93.5 92.5 95.2  95.0  PLT 360   < > 423* 314 280 276 279   < > = values in this interval not displayed.   Basic Metabolic Panel: Recent Labs  Lab 12/10/20 0539 12/11/20 0550 12/12/20 0706 12/12/20 1637 12/13/20 0603 12/14/20 0822  NA 138 142 143  --  143 141  K 4.0 3.0* 2.4* 3.3* 2.9* 3.5  CL 109 112* 111  --  108 105  CO2 21* 24 24  --  27 26  GLUCOSE 294* 150* 134*  --  130* 123*  BUN $Re'19 22 12  'lRR$ --  9 7*  CREATININE 0.67 0.66 0.54  --  0.55 0.49  CALCIUM 7.6* 7.9* 7.7*  --  8.0* 7.9*  MG 2.2 2.0 1.9  --  1.8 2.1  PHOS 2.1* 1.3* 1.1* 3.1 2.1* 2.4*   GFR: Estimated Creatinine Clearance: 76.4 mL/min (by C-G formula based on SCr of 0.49 mg/dL). Liver Function Tests: Recent Labs  Lab 12/08/20 0547  AST 43*  ALT 33  ALKPHOS 77  BILITOT 0.8  PROT 6.4*  ALBUMIN 2.8*   No results for input(s): LIPASE, AMYLASE in the last 168 hours. No results for input(s): AMMONIA in the last 168 hours. Coagulation Profile: Recent Labs  Lab 12/08/20 0547  INR 1.2   Cardiac Enzymes: No results for input(s): CKTOTAL, CKMB, CKMBINDEX, TROPONINI in the last 168 hours. BNP (last 3 results) No results for input(s): PROBNP in the last 8760 hours. HbA1C: No results for input(s): HGBA1C in the last 72 hours. CBG: Recent Labs  Lab 12/13/20 1217 12/13/20 1628 12/13/20 2313 12/14/20 0445 12/14/20 0751  GLUCAP 156* 134* 116* 116* 137*   Lipid Profile: No results for input(s): CHOL, HDL, LDLCALC, TRIG, CHOLHDL, LDLDIRECT in the last 72 hours. Thyroid Function Tests: No results for input(s): TSH, T4TOTAL, FREET4, T3FREE, THYROIDAB in the last 72 hours.  Anemia Panel: No results for input(s): VITAMINB12, FOLATE, FERRITIN, TIBC, IRON, RETICCTPCT in the last 72 hours. Sepsis Labs: Recent Labs  Lab 12/08/20 0547 12/08/20 0800 12/09/20 0508 12/10/20 0539  PROCALCITON 0.18  --  0.97 1.85  LATICACIDVEN 2.2* 1.7  --   --     Recent Results (from the past 240 hour(s))  Resp Panel by RT-PCR (Flu A&B,  Covid) Nasopharyngeal Swab     Status: None   Collection Time: 12/08/20  5:48 AM   Specimen: Nasopharyngeal Swab; Nasopharyngeal(NP) swabs in vial transport medium  Result Value Ref Range Status   SARS Coronavirus 2 by RT PCR NEGATIVE NEGATIVE Final    Comment: (NOTE) SARS-CoV-2 target nucleic acids are NOT DETECTED.  The SARS-CoV-2 RNA is generally detectable in upper respiratory specimens during the acute phase of infection. The lowest concentration of SARS-CoV-2 viral copies this assay can detect is 138 copies/mL. A negative result does not preclude SARS-Cov-2 infection and  should not be used as the sole basis for treatment or other patient management decisions. A negative result may occur with  improper specimen collection/handling, submission of specimen other than nasopharyngeal swab, presence of viral mutation(s) within the areas targeted by this assay, and inadequate number of viral copies(<138 copies/mL). A negative result must be combined with clinical observations, patient history, and epidemiological information. The expected result is Negative.  Fact Sheet for Patients:  EntrepreneurPulse.com.au  Fact Sheet for Healthcare Providers:  IncredibleEmployment.be  This test is no t yet approved or cleared by the [place] FDA and  has been authorized for detection and/or diagnosis of SARS-CoV-2 by FDA under an Emergency Use Authorization (EUA). This EUA will remain  in effect (meaning this test can be used) for the duration of the COVID-19 declaration under Section 564(b)(1) of the Act, 21 U.S.C.section 360bbb-3(b)(1), unless the authorization is terminated  or revoked sooner.       Influenza A by PCR NEGATIVE NEGATIVE Final   Influenza B by PCR NEGATIVE NEGATIVE Final    Comment: (NOTE) The Xpert Xpress SARS-CoV-2/FLU/RSV plus assay is intended as an aid in the diagnosis of influenza from Nasopharyngeal swab specimens and should  not be used as a sole basis for treatment. Nasal washings and aspirates are unacceptable for Xpert Xpress SARS-CoV-2/FLU/RSV testing.  Fact Sheet for Patients: EntrepreneurPulse.com.au  Fact Sheet for Healthcare Providers: IncredibleEmployment.be  This test is not yet approved or cleared by the [place] FDA and has been authorized for detection and/or diagnosis of SARS-CoV-2 by FDA under an Emergency Use Authorization (EUA). This EUA will remain in effect (meaning this test can be used) for the duration of the COVID-19 declaration under Section 564(b)(1) of the Act, 21 U.S.C. section 360bbb-3(b)(1), unless the authorization is terminated or revoked.  Performed at [facility], [address] [place], [place] [address]   Blood Culture (routine x 2)     Status: None (Preliminary result)   Collection Time: 12/08/20  5:48 AM   Specimen: BLOOD  Result Value Ref Range Status   Specimen Description BLOOD LEFT [facility]  Final   Special Requests   Final    BOTTLES DRAWN AEROBIC AND ANAEROBIC Blood Culture adequate volume   Culture   Final    NO GROWTH 4 DAYS Performed at [facility], [address], [place], [place] [address]    Report Status PENDING  Incomplete  Blood Culture (routine x 2)     Status: None (Preliminary result)   Collection Time: 12/08/20  5:48 AM   Specimen: BLOOD  Result Value Ref Range Status   Specimen Description BLOOD LEFT HAND  Final   Special Requests   Final    BOTTLES DRAWN AEROBIC AND ANAEROBIC Blood Culture adequate volume   Culture   Final    NO GROWTH 4 DAYS Performed at [facility], [address] [place], [place] [address]    Report Status PENDING  Incomplete  Urine Culture     Status: Abnormal   Collection Time: 12/08/20  6:19 AM   Specimen: In/Out Cath Urine  Result Value Ref Range Status   Specimen Description   Final    IN/OUT CATH URINE Performed at [facility], [address], [place], [place] [address]    Special Requests   Final    NONE Performed at [facility], [address] [place], [place] [address]    Culture >=100,000 COLONIES/mL ESCHERICHIA COLI (A)  Final   Report  Status 12/10/2020 FINAL  Final   Organism ID, Bacteria ESCHERICHIA COLI (A)  Final      Susceptibility   Escherichia coli - MIC*    AMPICILLIN >=32 RESISTANT Resistant     CEFAZOLIN 16 SENSITIVE Sensitive     CEFEPIME <=0.12 SENSITIVE Sensitive     CEFTRIAXONE 1 SENSITIVE Sensitive     CIPROFLOXACIN >=4 RESISTANT Resistant     GENTAMICIN >=16 RESISTANT Resistant     IMIPENEM <=0.25 SENSITIVE Sensitive     NITROFURANTOIN <=16 SENSITIVE Sensitive     TRIMETH/SULFA <=20 SENSITIVE Sensitive     AMPICILLIN/SULBACTAM >=32 RESISTANT Resistant     PIP/TAZO <=4 SENSITIVE Sensitive     * >=100,000 COLONIES/mL ESCHERICHIA COLI  MRSA Next Gen by PCR, Nasal     Status: None   Collection Time: 12/08/20  9:36 AM   Specimen: Nasal Mucosa; Nasal Swab  Result Value Ref Range Status   MRSA by PCR Next Gen NOT DETECTED NOT DETECTED Final    Comment: (NOTE) The GeneXpert MRSA Assay (FDA approved for NASAL specimens only), is one component of a comprehensive MRSA colonization surveillance program. It is not intended to diagnose MRSA infection nor to guide or monitor treatment for MRSA infections. Test performance is not FDA approved in patients less than 94 years old. Performed at [facility], [address] [place], [place] [address]   Culture, Respiratory w Gram Stain     Status: None   Collection Time: 12/08/20 11:20 AM   Specimen: Tracheal Aspirate  Result Value Ref Range Status   Specimen Description   Final    TRACHEAL ASPIRATE Performed at [facility], [address] [place], [place] [address]    Special Requests   Final    NONE Performed at [facility], [address] [place], [place] [address]    Gram Stain   Final     MODERATE WBC PRESENT,BOTH PMN AND MONONUCLEAR FEW GRAM POSITIVE COCCI IN PAIRS IN CLUSTERS Performed at [facility], [address], [place], [place] [address]    Culture ABUNDANT STAPHYLOCOCCUS AUREUS  Final   Report Status 12/10/2020 FINAL  Final   Organism ID, Bacteria STAPHYLOCOCCUS AUREUS  Final      Susceptibility   Staphylococcus aureus - MIC*    CIPROFLOXACIN <=0.5 SENSITIVE Sensitive     ERYTHROMYCIN <=0.25 SENSITIVE Sensitive     GENTAMICIN <=0.5 SENSITIVE Sensitive     OXACILLIN <=0.25 SENSITIVE Sensitive     TETRACYCLINE <=1 SENSITIVE Sensitive     VANCOMYCIN 1 SENSITIVE Sensitive     TRIMETH/SULFA <=10 SENSITIVE Sensitive     CLINDAMYCIN <=0.25 SENSITIVE Sensitive     RIFAMPIN <=0.5 SENSITIVE Sensitive     Inducible Clindamycin NEGATIVE Sensitive     * ABUNDANT STAPHYLOCOCCUS AUREUS         Radiology Studies: No results found.      Scheduled Meds:  aspirin EC  81 mg Oral Daily   buPROPion  300 mg Oral Daily   Chlorhexidine Gluconate Cloth  6 each Topical Q0600   divalproex  750 mg Oral Daily   feeding supplement (NEPRO CARB STEADY)  237 mL Oral BID BM   fenofibrate  160 mg Oral Daily   heparin injection (subcutaneous)  5,000 Units Subcutaneous Q8H   insulin aspart  0-15 Units Subcutaneous Q4H   levothyroxine  150 mcg Oral Q0600   losartan  25 mg Oral Daily   midodrine  10 mg Oral TID WC  multivitamin with minerals  1 tablet Oral Daily   pantoprazole (PROTONIX) IV  40 mg Intravenous QHS   polyethylene glycol  17 g Oral Daily   rosuvastatin  40 mg Oral Daily   thiamine injection  100 mg Intravenous Daily   Continuous Infusions:  sodium chloride 250 mL (12/14/20 0617)    ceFAZolin (ANCEF) IV 2 g (12/14/20 0617)   potassium PHOSPHATE IVPB (in mmol) 30 mmol (12/14/20 0917)     LOS: 6 days    Time spent: 30 mins     Wyvonnia Dusky, MD [facility] Pager [phone]  If 7PM-7AM, please contact night-coverage 12/14/2020,  11:41 AM

## 2020-12-15 DIAGNOSIS — Z515 Encounter for palliative care: Secondary | ICD-10-CM

## 2020-12-15 DIAGNOSIS — E039 Hypothyroidism, unspecified: Secondary | ICD-10-CM | POA: Diagnosis not present

## 2020-12-15 DIAGNOSIS — R627 Adult failure to thrive: Secondary | ICD-10-CM | POA: Diagnosis not present

## 2020-12-15 DIAGNOSIS — E876 Hypokalemia: Secondary | ICD-10-CM | POA: Diagnosis not present

## 2020-12-15 DIAGNOSIS — Z7189 Other specified counseling: Secondary | ICD-10-CM | POA: Diagnosis not present

## 2020-12-15 LAB — BASIC METABOLIC PANEL
Anion gap: 8 (ref 5–15)
BUN: 9 mg/dL (ref 8–23)
CO2: 25 mmol/L (ref 22–32)
Calcium: 7.7 mg/dL — ABNORMAL LOW (ref 8.9–10.3)
Chloride: 105 mmol/L (ref 98–111)
Creatinine, Ser: 0.52 mg/dL (ref 0.44–1.00)
GFR, Estimated: >60 mL/min (ref >60)
Glucose, Bld: 115 mg/dL — ABNORMAL HIGH (ref 70–99)
Potassium: 3.4 mmol/L — ABNORMAL LOW (ref 3.5–5.1)
Sodium: 138 mmol/L (ref 135–145)

## 2020-12-15 LAB — GLUCOSE, CAPILLARY
Glucose-Capillary: 124 mg/dL — ABNORMAL HIGH (ref 70–99)
Glucose-Capillary: 109 mg/dL — ABNORMAL HIGH (ref 70–99)
Glucose-Capillary: 122 mg/dL — ABNORMAL HIGH (ref 70–99)

## 2020-12-15 LAB — CBC
HCT: 33.4 % — ABNORMAL LOW (ref 36.0–46.0)
Hemoglobin: 11 g/dL — ABNORMAL LOW (ref 12.0–15.0)
MCH: 31.3 pg (ref 26.0–34.0)
MCHC: 32.9 g/dL (ref 30.0–36.0)
MCV: 95.2 fL (ref 80.0–100.0)
Platelets: 262 10*3/uL (ref 150–400)
RBC: 3.51 MIL/uL — ABNORMAL LOW (ref 3.87–5.11)
RDW: 16.3 % — ABNORMAL HIGH (ref 11.5–15.5)
WBC: 13.5 10*3/uL — ABNORMAL HIGH (ref 4.0–10.5)
nRBC: 0 % (ref 0.0–0.2)

## 2020-12-15 LAB — MAGNESIUM: Magnesium: 2.1 mg/dL (ref 1.7–2.4)

## 2020-12-15 LAB — PHOSPHORUS: Phosphorus: 3.3 mg/dL (ref 2.5–4.6)

## 2020-12-15 MED ORDER — POLYVINYL ALCOHOL 1.4 % OP SOLN
1.0000 [drp] | Freq: Four times a day (QID) | OPHTHALMIC | Status: DC | PRN
  Filled 2020-12-15: qty 15

## 2020-12-15 MED ORDER — HALOPERIDOL LACTATE 5 MG/ML IJ SOLN: 0.5000 mg | INTRAMUSCULAR | Status: DC | PRN

## 2020-12-15 MED ORDER — GLYCOPYRROLATE 0.2 MG/ML IJ SOLN
0.2000 mg | INTRAMUSCULAR | Status: DC | PRN
  Administered 2020-12-16: 03:00:00 0.2 mg via INTRAVENOUS
  Filled 2020-12-15 (×2): qty 1

## 2020-12-15 MED ORDER — MORPHINE SULFATE (PF) 2 MG/ML IV SOLN
2.0000 mg | INTRAVENOUS | Status: DC | PRN
  Administered 2020-12-15: 2 mg via INTRAVENOUS
  Filled 2020-12-15: qty 1

## 2020-12-15 MED ORDER — GLYCOPYRROLATE 1 MG PO TABS
1.0000 mg | ORAL_TABLET | ORAL | Status: DC | PRN
  Filled 2020-12-15: qty 1

## 2020-12-15 MED ORDER — ONDANSETRON 4 MG PO TBDP
4.0000 mg | ORAL_TABLET | Freq: Four times a day (QID) | ORAL | Status: DC | PRN
  Filled 2020-12-15: qty 1

## 2020-12-15 MED ORDER — LORAZEPAM 1 MG PO TABS: 1.0000 mg | ORAL_TABLET | ORAL | Status: DC | PRN

## 2020-12-15 MED ORDER — GLYCOPYRROLATE 0.2 MG/ML IJ SOLN
0.2000 mg | INTRAMUSCULAR | Status: DC | PRN
  Filled 2020-12-15: qty 1

## 2020-12-15 MED ORDER — LORAZEPAM 2 MG/ML IJ SOLN: 1.0000 mg | INTRAMUSCULAR | Status: DC | PRN

## 2020-12-15 MED ORDER — BIOTENE DRY MOUTH MT LIQD: 15.0000 mL | OROMUCOSAL | Status: DC | PRN

## 2020-12-15 MED ORDER — HALOPERIDOL 0.5 MG PO TABS
0.5000 mg | ORAL_TABLET | ORAL | Status: DC | PRN
  Filled 2020-12-15: qty 1

## 2020-12-15 MED ORDER — LORAZEPAM 2 MG/ML PO CONC
1.0000 mg | ORAL | Status: DC | PRN
  Filled 2020-12-15: qty 0.5

## 2020-12-15 MED ORDER — HALOPERIDOL LACTATE 2 MG/ML PO CONC
0.5000 mg | ORAL | Status: DC | PRN
  Filled 2020-12-15: qty 0.3

## 2020-12-15 MED ORDER — POTASSIUM CHLORIDE CRYS ER 20 MEQ PO TBCR
20.0000 meq | EXTENDED_RELEASE_TABLET | Freq: Once | ORAL | Status: AC
  Administered 2020-12-15: 20 meq via ORAL
  Filled 2020-12-15: qty 1

## 2020-12-15 MED ORDER — ONDANSETRON HCL 4 MG/2ML IJ SOLN: 4.0000 mg | Freq: Four times a day (QID) | INTRAMUSCULAR | Status: DC | PRN

## 2020-12-15 NOTE — Progress Notes (Signed)
SLP Cancellation Note  Patient Details Name: Julie Morrow MRN: [id number] DOB: November 13, 1950   Cancelled treatment:       Reason Eval/Treat Not Completed:  (chart reviewed).  ST orders discontinued by Palliative Care NP. ST services will sign off w/ MD to reconsult if new needs arise.      Jerilynn Som, MS, CCC-SLP Speech Language Pathologist Rehab Services [phone] [facility] 12/15/2020, 2:01 PM

## 2020-12-15 NOTE — Consult Note (Addendum)
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 Left IT with Stage 2 PI. Bowel management system in place for liquid stools and with minor seepage contributing to perineal dermatitis.  Intertriginous dermatitis to subpannicular areas and bilateral inguinal areas.  ICD-10 CM Codes for Irritant Dermatitis  L24A2 - Due to fecal, urinary or dual incontinence  L30.4  - Erythema intertrigo. Also used for abrasion of the hand, chafing of the skin, dermatitis due to sweating and friction, friction dermatitis, friction eczema, and genital/thigh intertrigo.    Measurement: Sacral Stage 3 measures 6cm x 9cm x 0.2cm.  90% pink, moist, 10% yellow slough. Small serous drainage. Left IT with healing Stage 2 pressure injury, 1cm x 0.5cm x 0.1cm, 100% pink. Scant serous drainage. Wound bed: As described above Drainage (amount, consistency, odor) As described above Periwound: intact Dressing procedure/placement/frequency: I will place an order today for a bariatric mattress replacement as patient is still positioned on wound with positioning in a standard sized mattress/bed. Prevalon Boots are ordered bilaterally today.  Our house antimicrobial wicking textile [place] # [phone]) is provided for skin fold moisture management (microclimate). Turning and repositioning is in place.  WOC nursing team will follow, seeking every 7-10 days and will remain available to this patient, the nursing and medical teams.   Thanks, Ladona Mow, MSN, RN, GNP, [place]  Pager# [phone]

## 2020-12-15 NOTE — Care Management Important Message (Signed)
Important Message  Patient Details  Name: Julie Morrow MRN: [id number] Date of Birth: 09-11-1950   Medicare Important Message Given:  Yes  Patient is on comfort care and out of respect for the patient and family no Important Message from [facility] given.  Olegario Messier A Niajah Sipos 12/15/2020, 2:55 PM

## 2020-12-15 NOTE — Consult Note (Signed)
PHARMACY CONSULT NOTE  Pharmacy Consult for Electrolyte Monitoring and Replacement   Recent Labs: Potassium (mmol/L)  Date Value  12/14/2020 3.5  10/30/2012 3.8   Magnesium (mg/dL)  Date Value  08/81/1031 2.1   Calcium (mg/dL)  Date Value  59/45/8592 7.9 (L)   Calcium, Total (mg/dL)  Date Value  92/44/6286 9.2   Albumin (g/dL)  Date Value  38/17/7116 2.8 (L)  10/30/2012 3.6   Phosphorus (mg/dL)  Date Value  57/90/3833 2.4 (L)   Sodium (mmol/L)  Date Value  12/14/2020 141  10/30/2012 138   Corr Ca: 8.86 mg/dL  Assessment: Patient is a 70 y/o F with medical history including dementia, bipolar disorder, depression / anxiety, diabetes, hypothyroidism who is admitted with respiratory failure secondary to pneumonia, myxedema coma. Pharmacy consulted to assist with electrolyte monitoring and replacement as indicated.  Goal of Therapy:  Electrolytes within normal limits  Plan:  K 3.4 - will replaced with PO Kcl 20 mEq x1 Recheck electrolytes with AM labs tomorrow  Raiford Noble, PharmD Clinical Pharmacist   12/15/2020 7:07 AM
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
## 2020-12-15 NOTE — Progress Notes (Signed)
PT Cancellation Note  Patient Details Name: Julie Morrow MRN: [id number] DOB: Mar 19, 1951   Cancelled Treatment:      PT orders cancelled at this time by NP. Pt to sign off, please re-consult if acute PT needs arise.  Olga Coaster PT, DPT 1:56 PM,12/15/20

## 2020-12-15 NOTE — Progress Notes (Signed)
PROGRESS NOTE    Julie Morrow  JJO:[id number] DOB: 09/30/1950 DOA: 12/08/2020 PCP: Townsend Roger, MD    Assessment & Plan:   Active Problems:   Acute respiratory failure (HCC)   Pressure injury of skin  Failure to thrive: secondary to all above. Pt & pt's family decided to proceed w/ comfort care only   Acute hypoxic respiratory failure: secondary to MSSA pneumonia. S/p intubation/ventilation and extubation on 12/10/20. Weaned off of supplemental oxygen. Encourage incentive spirometry   MSSA pneumonia: completed abx course.    Possibly septic shock: likely secondary to MSSA pneumonia, myxedema coma, UTI. 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  See previous progress notes on how pt met criteria. Vassopressors were weaned off. Resolved  Hypothyroidism: continue on levothyroxine. Myxedma coma while inpatient but this has resolved    UTI: secondary to e. coli. Completed abx course   AKI: resolved  Hypernatremia: resolved   Hypokalemia: slightly decreased, comfort care only   Hypophosphatemia: WNL today   HLD: comfort care only   Leukocytosis: likely secondary to above infections.completed abx course   DM2: likely poorly controlled. Comfort care only   Morbid obesity: BMI 40.6. Complicates overall care & prognosis    Acute metabolic encephalopathy: secondary to MSSA pneumonia, myxedema coma & UTI. Oriented to person, place only. Re-orient prn   Left sided weakness: CT head neg for acute intracranial abnormality. Etiology unclear.  Hx of bipolar disorder & deppression: unknown typeand/or severity.Comfort care only   Deep Tissue Injury to sacrum & Stage II Pressure injury to Left Ischium : present on admission. Comfort care only     DVT prophylaxis: comfort care only  Code Status: full  Family Communication:  Disposition Plan: hospice facility  Level of care: Progressive Cardiac  Status is: Inpatient  Remains inpatient appropriate because:Unsafe d/c plan, IV treatments  appropriate due to intensity of illness or inability to take PO, and Inpatient level of care appropriate due to severity of illness  Dispo: The patient is from: Home              Anticipated d/c is to: hospice facility               Patient currently is not medically stable to d/c.   Difficult to place patient :unclear    Consultants:  ICU   Procedures: intubation,ventilation& extubation  Antimicrobials: cefazolin    Subjective: Pt c/o malaise  Objective: Vitals:   12/14/20 1539 12/14/20 2113 12/15/20 0401 12/15/20 0500  BP: (!) 161/132 (!) 115/56 110/66   Pulse: 91 76 84   Resp: 19  19   Temp: (!) 97.4 F (36.3 C) 99.3 F (37.4 C) 98 F (36.7 C)   TempSrc:   Oral   SpO2:  100% 93%   Weight:    104 kg  Height:        Intake/Output Summary (Last 24 hours) at 12/15/2020 0746 Last data filed at 12/15/2020 0023 Gross per 24 hour  Intake 0 ml  Output 1275 ml  Net -1275 ml   Filed Weights   12/13/20 0444 12/14/20 0500 12/15/20 0500  Weight: 104 kg 103.6 kg 104 kg    Examination:  General exam: Appears uncomfortable  Respiratory system: decreased breath sounds b/l Cardiovascular system: S1 &S2+. No rubs or clicks   Gastrointestinal system: Abd is soft, NT, ND & hyperactive bowel sounds  Central nervous system: Alert and awake. Moves all extremities  Psychiatry: Judgement and insight appear abnormal. Flat mood and affect  Data Reviewed: I have personally reviewed following labs and imaging studies  CBC: Recent Labs  Lab 12/10/20 0539 12/11/20 0550 12/12/20 0706 12/13/20 0603 12/14/20 0822  WBC 42.8* 26.9* 16.9* 14.1* 11.2*  HGB 12.0 10.7* 11.0* 10.6* 11.0*  HCT 36.3 32.9* 33.3* 31.6* 34.2*  MCV 96.5 93.5 92.5 95.2 95.0  PLT 423* 314 280 276 213   Basic Metabolic Panel: Recent Labs  Lab 12/10/20 0539 12/11/20 0550 12/12/20 0706 12/12/20 1637 12/13/20 0603 12/14/20 0822  NA 138 142 143  --  143 141  K 4.0 3.0* 2.4* 3.3* 2.9* 3.5  CL 109 112*  111  --  108 105  CO2 21* 24 24  --  27 26  GLUCOSE 294* 150* 134*  --  130* 123*  BUN _0 --  9 7*  CREATININE 0.67 0.66 0.54  --  0.55 0.49  CALCIUM 7.6* 7.9* 7.7*  --  8.0* 7.9*  MG 2.2 2.0 1.9  --  1.8 2.1  PHOS 2.1* 1.3* 1.1* 3.1 2.1* 2.4*   GFR: Estimated Creatinine Clearance: 76.5 mL/min (by C-G formula based on SCr of 0.49 mg/dL). Liver Function Tests: No results for input(s): AST, ALT, ALKPHOS, BILITOT, PROT, ALBUMIN in the last 168 hours.  No results for input(s): LIPASE, AMYLASE in the last 168 hours. No results for input(s): AMMONIA in the last 168 hours. Coagulation Profile: No results for input(s): INR, PROTIME in the last 168 hours.  Cardiac Enzymes: No results for input(s): CKTOTAL, CKMB, CKMBINDEX, TROPONINI in the last 168 hours. BNP (last 3 results) No results for input(s): PROBNP in the last 8760 hours. HbA1C: No results for input(s): HGBA1C in the last 72 hours. CBG: Recent Labs  Lab 12/14/20 1300 12/14/20 1537 12/14/20 1953 12/14/20 2328 12/15/20 0430  GLUCAP 127* 167* 126* 96 124*   Lipid Profile: No results for input(s): CHOL, HDL, LDLCALC, TRIG, CHOLHDL, LDLDIRECT in the last 72 hours. Thyroid Function Tests: No results for input(s): TSH, T4TOTAL, FREET4, T3FREE, THYROIDAB in the last 72 hours.  Anemia Panel: No results for input(s): VITAMINB12, FOLATE, FERRITIN, TIBC, IRON, RETICCTPCT in the last 72 hours. Sepsis Labs: Recent Labs  Lab 12/08/20 0800 12/09/20 0508 12/10/20 0539  PROCALCITON  --  0.97 1.85  LATICACIDVEN 1.7  --   --     Recent Results (from the past 240 hour(s))  Resp Panel by RT-PCR (Flu A&B, Covid) Nasopharyngeal Swab     Status: None   Collection Time: 12/08/20  5:48 AM   Specimen: Nasopharyngeal Swab; Nasopharyngeal(NP) swabs in vial transport medium  Result Value Ref Range Status   SARS Coronavirus 2 by RT PCR NEGATIVE NEGATIVE Final    Comment: (NOTE) SARS-CoV-2 target nucleic acids are NOT  DETECTED.  The SARS-CoV-2 RNA is generally detectable in upper respiratory specimens during the acute phase of infection. The lowest concentration of SARS-CoV-2 viral copies this assay can detect is 138 copies/mL. A negative result does not preclude SARS-Cov-2 infection and should not be used as the sole basis for treatment or other patient management decisions. A negative result may occur with  improper specimen collection/handling, submission of specimen other than nasopharyngeal swab, presence of viral mutation(s) within the areas targeted by this assay, and inadequate number of viral copies(<138 copies/mL). A negative result must be combined with clinical observations, patient history, and epidemiological information. The expected result is Negative.  Fact Sheet for Patients:  EntrepreneurPulse.com.au  Fact Sheet for Healthcare Providers:  IncredibleEmployment.be  This test is no t  yet approved or cleared by the [place] and  has been authorized for detection and/or diagnosis of SARS-CoV-2 by FDA under an Emergency Use Authorization (EUA). This EUA will remain  in effect (meaning this test can be used) for the duration of the COVID-19 declaration under Section 564(b)(1) of the Act, 21 U.S.C.section 360bbb-3(b)(1), unless the authorization is terminated  or revoked sooner.       Influenza A by PCR NEGATIVE NEGATIVE Final   Influenza B by PCR NEGATIVE NEGATIVE Final    Comment: (NOTE) The Xpert Xpress SARS-CoV-2/FLU/RSV plus assay is intended as an aid in the diagnosis of influenza from Nasopharyngeal swab specimens and should not be used as a sole basis for treatment. Nasal washings and aspirates are unacceptable for Xpert Xpress SARS-CoV-2/FLU/RSV testing.  Fact Sheet for Patients: EntrepreneurPulse.com.au  Fact Sheet for Healthcare Providers: IncredibleEmployment.be  This test is not yet  approved or cleared by the [place] FDA and has been authorized for detection and/or diagnosis of SARS-CoV-2 by FDA under an Emergency Use Authorization (EUA). This EUA will remain in effect (meaning this test can be used) for the duration of the COVID-19 declaration under Section 564(b)(1) of the Act, 21 U.S.C. section 360bbb-3(b)(1), unless the authorization is terminated or revoked.  Performed at [facility], [address] [place], [place] [address]   Blood Culture (routine x 2)     Status: None (Preliminary result)   Collection Time: 12/08/20  5:48 AM   Specimen: BLOOD  Result Value Ref Range Status   Specimen Description BLOOD LEFT [facility]  Final   Special Requests   Final    BOTTLES DRAWN AEROBIC AND ANAEROBIC Blood Culture adequate volume   Culture   Final    NO GROWTH 4 DAYS Performed at [facility], [address] [place], [place] [address]    Report Status PENDING  Incomplete  Blood Culture (routine x 2)     Status: None (Preliminary result)   Collection Time: 12/08/20  5:48 AM   Specimen: BLOOD  Result Value Ref Range Status   Specimen Description BLOOD LEFT HAND  Final   Special Requests   Final    BOTTLES DRAWN AEROBIC AND ANAEROBIC Blood Culture adequate volume   Culture   Final    NO GROWTH 4 DAYS Performed at [facility], [address] [place], [place] [address]    Report Status PENDING  Incomplete  Urine Culture     Status: Abnormal   Collection Time: 12/08/20  6:19 AM   Specimen: In/Out Cath Urine  Result Value Ref Range Status   Specimen Description   Final    IN/OUT CATH URINE Performed at [facility], [address], [place], [place] [address]    Special Requests   Final    NONE Performed at [facility], [address] [place], [place] [address]    Culture >=100,000 COLONIES/mL ESCHERICHIA COLI (A)  Final   Report Status 12/10/2020 FINAL  Final   Organism ID, Bacteria ESCHERICHIA COLI  (A)  Final      Susceptibility   Escherichia coli - MIC*    AMPICILLIN >=32 RESISTANT Resistant     CEFAZOLIN 16 SENSITIVE Sensitive     CEFEPIME <=0.12 SENSITIVE Sensitive     CEFTRIAXONE 1 SENSITIVE Sensitive     CIPROFLOXACIN >=4 RESISTANT Resistant     GENTAMICIN >=16 RESISTANT Resistant     IMIPENEM <=0.25 SENSITIVE Sensitive     NITROFURANTOIN <=16 SENSITIVE Sensitive  TRIMETH/SULFA <=20 SENSITIVE Sensitive     AMPICILLIN/SULBACTAM >=32 RESISTANT Resistant     PIP/TAZO <=4 SENSITIVE Sensitive     * >=100,000 COLONIES/mL ESCHERICHIA COLI  MRSA Next Gen by PCR, Nasal     Status: None   Collection Time: 12/08/20  9:36 AM   Specimen: Nasal Mucosa; Nasal Swab  Result Value Ref Range Status   MRSA by PCR Next Gen NOT DETECTED NOT DETECTED Final    Comment: (NOTE) The GeneXpert MRSA Assay (FDA approved for NASAL specimens only), is one component of a comprehensive MRSA colonization surveillance program. It is not intended to diagnose MRSA infection nor to guide or monitor treatment for MRSA infections. Test performance is not FDA approved in patients less than 68 years old. Performed at [facility], [address] [place], [place] [address]   Culture, Respiratory w Gram Stain     Status: None   Collection Time: 12/08/20 11:20 AM   Specimen: Tracheal Aspirate  Result Value Ref Range Status   Specimen Description   Final    TRACHEAL ASPIRATE Performed at [facility], [address], [place], [place] [address]    Special Requests   Final    NONE Performed at [facility], [address] [place], [place] [address]    Gram Stain   Final    MODERATE WBC PRESENT,BOTH PMN AND MONONUCLEAR FEW GRAM POSITIVE COCCI IN PAIRS IN CLUSTERS Performed at [facility], [place] [address] [place], [place] [address]    Culture ABUNDANT STAPHYLOCOCCUS AUREUS  Final   Report Status 12/10/2020 FINAL  Final   Organism ID, Bacteria STAPHYLOCOCCUS AUREUS   Final      Susceptibility   Staphylococcus aureus - MIC*    CIPROFLOXACIN <=0.5 SENSITIVE Sensitive     ERYTHROMYCIN <=0.25 SENSITIVE Sensitive     GENTAMICIN <=0.5 SENSITIVE Sensitive     OXACILLIN <=0.25 SENSITIVE Sensitive     TETRACYCLINE <=1 SENSITIVE Sensitive     VANCOMYCIN 1 SENSITIVE Sensitive     TRIMETH/SULFA <=10 SENSITIVE Sensitive     CLINDAMYCIN <=0.25 SENSITIVE Sensitive     RIFAMPIN <=0.5 SENSITIVE Sensitive     Inducible Clindamycin NEGATIVE Sensitive     * ABUNDANT STAPHYLOCOCCUS AUREUS         Radiology Studies: No results found.      Scheduled Meds:  aspirin EC  81 mg Oral Daily   buPROPion  300 mg Oral Daily   Chlorhexidine Gluconate Cloth  6 each Topical Q0600   divalproex  750 mg Oral Daily   feeding supplement (NEPRO CARB STEADY)  237 mL Oral BID BM   fenofibrate  160 mg Oral Daily   heparin injection (subcutaneous)  5,000 Units Subcutaneous Q8H   insulin aspart  0-15 Units Subcutaneous Q4H   levothyroxine  150 mcg Oral Q0600   losartan  25 mg Oral Daily   midodrine  10 mg Oral TID WC   multivitamin with minerals  1 tablet Oral Daily   pantoprazole (PROTONIX) IV  40 mg Intravenous QHS   polyethylene glycol  17 g Oral Daily   rosuvastatin  40 mg Oral Daily   thiamine injection  100 mg Intravenous Daily   Continuous Infusions:  sodium chloride 250 mL (12/14/20 0617)    ceFAZolin (ANCEF) IV 2 g (12/15/20 0549)     LOS: 7 days    Time spent: 15 mins     Wyvonnia Dusky, MD [facility] Pager [phone]  If  7PM-7AM, please contact night-coverage 12/15/2020, 7:46 AM

## 2020-12-15 NOTE — Progress Notes (Signed)
Physical Therapy Treatment Patient Details Name: Julie Morrow MRN: [id number] DOB: [birth year] Today's Date: 12/15/2020    History of Present Illness Pt is a 70 y/o that presented to the ED for AMS, repiratory distress. Workup showed septic shock, acute respiratory failure, acute metabolic encephalopathy in sitting of UTI and PNA as well as  myxedema coma. Intubated in ED, extubated 7/27. F PMH of medical history including dementia, bipolar disorder, depression / anxiety, diabetes, hypothyroidism    PT Comments    Pt alert, oriented to DOB and first name, disoriented to time, situation. Pt is unable to follow 1-step commands with multi-modal cues and increased processing time. Total assist is needed for all levels of mobility including rolling and transfers. Pt was unable to participate in therapy this session with active physical resistance when attempting supine > sit. Pt also demonstrates difficulty with leftward gaze and left cervical rotation when prompted. Pt left in chair position in bed. Skilled PT intervention is indicated to address deficits in function, mobility, and to return to PLOF as able.  Discharge recommendations are SNF.   Follow Up Recommendations  SNF     Equipment Recommendations       Recommendations for Other Services       Precautions / Restrictions Precautions Precautions: Fall Precaution Comments: L IJ cath Restrictions Weight Bearing Restrictions: No    Mobility  Bed Mobility Overal bed mobility: Needs Assistance Bed Mobility: Rolling Rolling: Total assist   Supine to sit: Total assist;+2 for physical assistance     General bed mobility comments: Pt unable to assist with rolling or sit EOB    Transfers                 General transfer comment: Deferred d/t safety concerns  Ambulation/Gait             General Gait Details: not appropriate at this time   Stairs             Wheelchair Mobility    Modified Rankin  (Stroke Patients Only)       Balance Overall balance assessment: Needs assistance   Sitting balance-Leahy Scale: Zero Sitting balance - Comments: unable to achieve static sitting balance     Standing balance-Leahy Scale: Zero                              Cognition Arousal/Alertness: Awake/alert Behavior During Therapy: Flat affect Overall Cognitive Status: History of cognitive impairments - at baseline Area of Impairment: Attention;Orientation;Problem solving;Memory;Following commands;Awareness                 Orientation Level: Disoriented to;Place;Time;Situation Current Attention Level: Divided Memory: Decreased short-term memory Following Commands: Follows one step commands inconsistently Safety/Judgement: Decreased awareness of deficits Awareness: Intellectual Problem Solving: Slow processing;Decreased initiation;Difficulty sequencing;Requires verbal cues;Requires tactile cues General Comments: Pt able to provide name, DOB; disoriented to place, situation; unable to follow 1-step commands      Exercises      General Comments        Pertinent Vitals/Pain Pain Assessment: Faces Faces Pain Scale: Hurts little more Pain Location: Lower back intermittent with movement Pain Descriptors / Indicators: Moaning;Grimacing;Guarding Pain Intervention(s): Limited activity within patient's tolerance;Monitored during session;Repositioned    Home Living                      Prior Function  PT Goals (current goals can now be found in the care plan section) Acute Rehab PT Goals Patient Stated Goal: to "get back to my house" PT Goal Formulation: Patient unable to participate in goal setting Time For Goal Achievement: 12/25/20 Potential to Achieve Goals: Fair Progress towards PT goals: Progressing toward goals    Frequency    Min 2X/week      PT Plan Current plan remains appropriate    Co-evaluation              AM-PAC  PT "6 Clicks" Mobility   Outcome Measure  Help needed turning from your back to your side while in a flat bed without using bedrails?: Total Help needed moving from lying on your back to sitting on the side of a flat bed without using bedrails?: Total Help needed moving to and from a bed to a chair (including a wheelchair)?: Total Help needed standing up from a chair using your arms (e.g., wheelchair or bedside chair)?: Total Help needed to walk in hospital room?: Total Help needed climbing 3-5 steps with a railing? : Total 6 Click Score: 6    End of Session   Activity Tolerance: Patient tolerated treatment well;Other (comment) (Limited by active participation) Patient left: in bed;with call bell/phone within reach;with bed alarm set Nurse Communication: Mobility status PT Visit Diagnosis: Other abnormalities of gait and mobility (R26.89);Muscle weakness (generalized) (M62.81);Difficulty in walking, not elsewhere classified (R26.2)     Time: 1245-8099 PT Time Calculation (min) (ACUTE ONLY): 25 min  Charges:                        [employer], SPT

## 2020-12-15 NOTE — TOC Progression Note (Signed)
PTransition of Care [facility]) - Progression Note    Patient Details  Name: Julie Morrow MRN: [id number] Date of Birth: 1950-11-06  Transition of Care [facility]) CM/SW Contact  Maree Krabbe, LCSW Phone Number: 12/15/2020, 2:49 PM  Clinical Narrative:  Pt is comfort care with residential hospice recommendation. Referral provided to [facility] with [facility].          Expected Discharge Plan and Services                                                 Social Determinants of Health (SDOH) Interventions    Readmission Risk Interventions No flowsheet data found.

## 2020-12-15 NOTE — Progress Notes (Signed)
[facility] [address] [employer], contracting [facility]) Hospital Liaison RN Note  Received request from [place], LCSW Transitions of Care [facility]) Manager for family interest in Hospice Home. Chart and patient information reviewed by [facility] physician. Hospice Home eligibility confirmed.   Spoke with patient's son, Alejandrina Raimer and patient's sister, Dedra Skeens to confirm interest and explain services.   Unfortunately Hospice Home is not able to offer a room today. Family and TOC aware that hospital liaison will follow up tomorrow if a room becomes available.   Please do not hesitate to call with any hospice related questions.   Thank you for the opportunity to participate in this patient's care.   Bobbie "Einar Gip, RN, BSN [facility] Liaison [phone]

## 2020-12-16 DIAGNOSIS — J9601 Acute respiratory failure with hypoxia: Secondary | ICD-10-CM | POA: Diagnosis not present

## 2020-12-16 DIAGNOSIS — E039 Hypothyroidism, unspecified: Secondary | ICD-10-CM | POA: Diagnosis not present

## 2020-12-16 DIAGNOSIS — R627 Adult failure to thrive: Secondary | ICD-10-CM | POA: Diagnosis not present

## 2020-12-16 LAB — CULTURE, BLOOD (ROUTINE X 2)
Culture: NO GROWTH
Culture: NO GROWTH
Special Requests: ADEQUATE
Special Requests: ADEQUATE

## 2020-12-16 NOTE — TOC Transition Note (Signed)
Transition of Care [facility]) - CM/SW Discharge Note   Patient Details  Name: Julie Morrow MRN: [id number] Date of Birth: 11/04/1950  Transition of Care [facility]) CM/SW Contact:  Allayne Butcher, RN Phone Number: 12/16/2020, 8:53 AM   Clinical Narrative:    [facility] can accept patient today.  Transport arranged for 12pm with Financial planner.   Final next level of care: Hospice Medical Facility Barriers to Discharge: Barriers Resolved   Patient Goals and CMS Choice Patient states their goals for this hospitalization and ongoing recovery are:: Residential Hospice CMS Medicare.gov Compare Post Acute Care list provided to:: Patient Represenative (must comment) Choice offered to / list presented to : Sibling  Discharge Placement              Patient chooses bed at: Other - please specify in the comment section below: [facility]) Patient to be transferred to facility by: [facility] Name of family member notified: Family Patient and family notified of of transfer: 12/16/20  Discharge Plan and Services                DME Arranged: N/A DME Agency: NA       HH Arranged: NA          Social Determinants of Health (SDOH) Interventions     Readmission Risk Interventions No flowsheet data found.

## 2020-12-16 NOTE — Discharge Summary (Signed)
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  Final      Susceptibility   Staphylococcus aureus - MIC*    CIPROFLOXACIN <=0.5 SENSITIVE Sensitive     ERYTHROMYCIN <=0.25 SENSITIVE Sensitive     GENTAMICIN <=0.5 SENSITIVE Sensitive     OXACILLIN <=0.25 SENSITIVE Sensitive     TETRACYCLINE <=1 SENSITIVE Sensitive     VANCOMYCIN 1 SENSITIVE Sensitive     TRIMETH/SULFA <=10 SENSITIVE Sensitive     CLINDAMYCIN <=0.25 SENSITIVE Sensitive     RIFAMPIN <=0.5 SENSITIVE Sensitive     Inducible Clindamycin NEGATIVE Sensitive     * ABUNDANT STAPHYLOCOCCUS AUREUS     Labs: BNP (last 3 results) Recent Labs    10/06/20 0721 11/25/20 0804 12/08/20 0547  BNP 39.7 35.5 21.3   Basic Metabolic Panel: Recent Labs  Lab  12/11/20 0550 12/12/20 0706 12/12/20 1637 12/13/20 0603 12/14/20 0822 12/15/20 0733  NA 142 143  --  143 141 138  K 3.0* 2.4* 3.3* 2.9* 3.5 3.4*  CL 112* 111  --  108 105 105  CO2 24 24  --  $R'27 26 25  'on$ GLUCOSE 150* 134*  --  130* 123* 115*  BUN 22 12  --  9 7* 9  CREATININE 0.66 0.54  --  0.55 0.49 0.52  CALCIUM 7.9* 7.7*  --  8.0* 7.9* 7.7*  MG 2.0 1.9  --  1.8 2.1 2.1  PHOS 1.3* 1.1* 3.1 2.1* 2.4* 3.3   Liver Function Tests: No results for input(s): AST, ALT, ALKPHOS, BILITOT, PROT, ALBUMIN in the last 168 hours. No results for input(s): LIPASE, AMYLASE in the last 168 hours. No results for input(s): AMMONIA in the last 168 hours. CBC: Recent Labs  Lab 12/11/20 0550 12/12/20 0706 12/13/20 0603 12/14/20 0822 12/15/20 0733  WBC 26.9* 16.9* 14.1* 11.2* 13.5*  HGB 10.7* 11.0* 10.6* 11.0* 11.0*  HCT 32.9* 33.3* 31.6* 34.2* 33.4*  MCV 93.5 92.5 95.2 95.0 95.2  PLT 314 280 276 279 262   Cardiac Enzymes: No results for input(s): CKTOTAL, CKMB, CKMBINDEX, TROPONINI in the last 168 hours. BNP: Invalid input(s): POCBNP CBG: Recent Labs  Lab 12/14/20 1953 12/14/20 2328 12/15/20 0430 12/15/20 0830 12/15/20 1136  GLUCAP 126* 96 124* 109* 122*   D-Dimer No results for input(s): DDIMER in the last 72 hours. Hgb A1c No results for input(s): HGBA1C in the last 72 hours. Lipid Profile No results for input(s): CHOL, HDL, LDLCALC, TRIG, CHOLHDL, LDLDIRECT in the last 72 hours. Thyroid function studies No results for input(s): TSH, T4TOTAL, T3FREE, THYROIDAB in the last 72 hours.  Invalid input(s): FREET3 Anemia work up No results for input(s): VITAMINB12, FOLATE, FERRITIN, TIBC, IRON, RETICCTPCT in the last 72 hours. Urinalysis    Component Value Date/Time   COLORURINE AMBER (A) 12/08/2020 0619   APPEARANCEUR CLOUDY (A) 12/08/2020 0619   APPEARANCEUR Cloudy 10/30/2012 1857   LABSPEC 1.017 12/08/2020 0619   LABSPEC 1.019 10/30/2012 1857   PHURINE 5.0 12/08/2020  [place] 12/08/2020 0619   GLUCOSEU Negative 10/30/2012 [place] NEGATIVE 12/08/2020 [place] 12/08/2020 0619   BILIRUBINUR Negative 10/30/2012 1857   KETONESUR NEGATIVE 12/08/2020 0619   PROTEINUR NEGATIVE 12/08/2020 0619   NITRITE POSITIVE (A) 12/08/2020 0619   LEUKOCYTESUR LARGE (A) 12/08/2020 0619   LEUKOCYTESUR Trace 10/30/2012 1857   Sepsis Labs Invalid input(s): PROCALCITONIN,  WBC,  LACTICIDVEN Microbiology Recent Results (from the past 240 hour(s))  Resp Panel by RT-PCR (Flu A&B, Covid) Nasopharyngeal Swab     Status: None   Collection Time: 12/08/20  5:48 AM   Specimen: Nasopharyngeal Swab; Nasopharyngeal(NP) swabs in vial transport medium  Result Value Ref Range Status   SARS Coronavirus 2 by RT PCR NEGATIVE NEGATIVE Final    Comment: (NOTE) SARS-CoV-2 target nucleic acids are NOT DETECTED.  The SARS-CoV-2 RNA is generally detectable in upper respiratory specimens during the acute phase of infection. The lowest concentration of SARS-CoV-2 viral copies this assay can detect is 138 copies/mL. A negative result does not preclude SARS-Cov-2 infection and should not be used as the sole basis for treatment or other patient management decisions. A negative result may occur with  improper specimen collection/handling, submission of specimen other than nasopharyngeal swab, presence of viral mutation(s) within the areas targeted by this assay, and inadequate number of viral copies(<138 copies/mL). A negative result must be combined with clinical observations, patient history, and epidemiological information. The expected result is Negative.  Fact Sheet for Patients:  EntrepreneurPulse.com.au  Fact Sheet for Healthcare Providers:  IncredibleEmployment.be  This test is no t yet approved or cleared by the [place] FDA and  has been authorized for detection and/or diagnosis of SARS-CoV-2 by FDA under  an Emergency Use Authorization (EUA). This EUA will remain  in effect (meaning this test can be used) for the duration of the COVID-19 declaration under Section 564(b)(1) of the Act, 21 U.S.C.section 360bbb-3(b)(1), unless the authorization is terminated  or revoked sooner.       Influenza A by PCR NEGATIVE NEGATIVE Final   Influenza B by PCR NEGATIVE NEGATIVE Final    Comment: (NOTE) The Xpert Xpress SARS-CoV-2/FLU/RSV plus assay is intended as an aid in the diagnosis of influenza from Nasopharyngeal swab specimens and should not be used as a sole basis for treatment. Nasal washings and aspirates are unacceptable for Xpert Xpress SARS-CoV-2/FLU/RSV testing.  Fact Sheet for Patients: EntrepreneurPulse.com.au  Fact Sheet for Healthcare Providers: IncredibleEmployment.be  This test is not yet approved or cleared by the [place] FDA and has been authorized for detection and/or diagnosis of SARS-CoV-2 by FDA under an Emergency Use Authorization (EUA). This EUA will remain in effect (meaning this test can be used) for the duration of the COVID-19 declaration under Section 564(b)(1) of the Act, 21 U.S.C. section 360bbb-3(b)(1), unless the authorization is terminated or revoked.  Performed at [facility], [address] [place], [place] [address]   Blood Culture (routine x 2)     Status: None   Collection Time: 12/08/20  5:48 AM   Specimen: BLOOD  Result Value Ref Range Status   Specimen Description BLOOD LEFT [facility]  Final   Special Requests   Final    BOTTLES DRAWN AEROBIC AND ANAEROBIC Blood Culture adequate volume   Culture   Final    NO GROWTH 8 DAYS Performed at [facility], [address] [place], [place] [address]    Report Status 12/16/2020 FINAL  Final  Blood Culture (routine x 2)     Status: None   Collection Time: 12/08/20  5:48 AM   Specimen: BLOOD  Result Value Ref Range Status   Specimen Description  BLOOD LEFT HAND  Final   Special Requests   Final    BOTTLES DRAWN AEROBIC AND ANAEROBIC Blood Culture adequate volume   Culture   Final    NO GROWTH 8 DAYS Performed at [facility], [address] [place], [place] [address]    Report Status 12/16/2020 FINAL  Final  Urine Culture     Status: Abnormal   Collection Time: 12/08/20  6:19 AM   Specimen: In/Out Cath Urine  Result Value Ref Range Status   Specimen Description   Final    IN/OUT CATH URINE Performed at [facility], [address] [place], [place] [address]    Special Requests   Final    NONE Performed at [facility], [address] [place], [place] [address]    Culture >=100,000 COLONIES/mL ESCHERICHIA COLI (A)  Final   Report Status 12/10/2020 FINAL  Final   Organism ID, Bacteria ESCHERICHIA COLI (A)  Final      Susceptibility   Escherichia coli - MIC*    AMPICILLIN >=32 RESISTANT Resistant     CEFAZOLIN 16 SENSITIVE Sensitive     CEFEPIME <=0.12 SENSITIVE Sensitive     CEFTRIAXONE 1 SENSITIVE Sensitive     CIPROFLOXACIN >=4 RESISTANT Resistant     GENTAMICIN >=16 RESISTANT Resistant     IMIPENEM <=0.25 SENSITIVE Sensitive     NITROFURANTOIN <=16 SENSITIVE Sensitive     TRIMETH/SULFA <=20 SENSITIVE Sensitive     AMPICILLIN/SULBACTAM >=32 RESISTANT Resistant     PIP/TAZO <=4 SENSITIVE Sensitive     * >=100,000 COLONIES/mL ESCHERICHIA COLI  MRSA Next Gen by PCR, Nasal     Status: None   Collection Time: 12/08/20  9:36 AM   Specimen: Nasal Mucosa; Nasal Swab  Result Value Ref Range Status   MRSA by PCR Next Gen NOT DETECTED NOT DETECTED Final    Comment: (NOTE) The GeneXpert MRSA Assay (FDA approved for NASAL specimens only), is one component of a comprehensive MRSA colonization surveillance program. It is not intended to diagnose MRSA infection nor to guide or monitor treatment for MRSA infections. Test performance is not FDA approved in patients less than 56  years old. Performed at [facility], [address] [place], [place] [address]   Culture, Respiratory w Gram Stain     Status: None   Collection Time: 12/08/20 11:20 AM   Specimen: Tracheal Aspirate  Result Value Ref Range Status   Specimen Description   Final    TRACHEAL ASPIRATE Performed at [facility], [address] [place], [place] [address]    Special Requests   Final    NONE Performed at [facility], [address] [place], [place] [address]    Gram Stain   Final    MODERATE WBC PRESENT,BOTH PMN AND MONONUCLEAR FEW GRAM POSITIVE COCCI IN PAIRS IN CLUSTERS Performed at [facility], [place] [address], [place], [place] [address]    Culture ABUNDANT STAPHYLOCOCCUS AUREUS  Final   Report Status 12/10/2020 FINAL  Final   Organism ID, Bacteria STAPHYLOCOCCUS AUREUS  Final      Susceptibility   Staphylococcus aureus - MIC*    CIPROFLOXACIN <=0.5 SENSITIVE Sensitive     ERYTHROMYCIN <=0.25 SENSITIVE Sensitive     GENTAMICIN <=0.5 SENSITIVE Sensitive  OXACILLIN <=0.25 SENSITIVE Sensitive     TETRACYCLINE <=1 SENSITIVE Sensitive     VANCOMYCIN 1 SENSITIVE Sensitive     TRIMETH/SULFA <=10 SENSITIVE Sensitive     CLINDAMYCIN <=0.25 SENSITIVE Sensitive     RIFAMPIN <=0.5 SENSITIVE Sensitive     Inducible Clindamycin NEGATIVE Sensitive     * ABUNDANT STAPHYLOCOCCUS AUREUS     Time coordinating discharge: Over 30 minutes  SIGNED:   Wyvonnia Dusky, MD  [facility] 12/16/2020, 9:14 AM Pager   If 7PM-7AM, please contact night-coverage

## 2020-12-16 NOTE — Progress Notes (Signed)
[facility]) Hospital Liaison RN Note  Hospice Home is able to offer a room today. Family agreeable to transfer today. Robbie Lis, RN [facility] Manager aware.   RN please call report to the Hospice Home at [phone] prior to patient leaving the unit.  Please send signed and completed DNR with patient at discharge.  Please do not hesitate to call with any hospice related questions.  Thank you,   Bobbie "Einar Gip, RN, BSN [facility] Liaison [phone]

## 2021-01-15 DEATH — deceased

## 2021-01-22 ENCOUNTER — Ambulatory Visit: Payer: Medicare Other | Admitting: Gastroenterology

## 2022-03-10 LAB — HSV DNA BY PCR (REFERENCE LAB)
HSV 1 DNA: NEGATIVE
HSV 2 DNA: NEGATIVE
